# Patient Record
Sex: Male | Born: 1940 | Race: White | Hispanic: No | Smoking: Former smoker
Health system: Southern US, Community
[De-identification: ages and names within clinical notes are randomized; demographics above are authoritative.]

## PROBLEM LIST (undated history)

## (undated) DIAGNOSIS — I739 Peripheral vascular disease, unspecified: Secondary | ICD-10-CM

## (undated) DIAGNOSIS — R9439 Abnormal result of other cardiovascular function study: Secondary | ICD-10-CM

## (undated) DIAGNOSIS — I509 Heart failure, unspecified: Secondary | ICD-10-CM

## (undated) DIAGNOSIS — M1990 Unspecified osteoarthritis, unspecified site: Secondary | ICD-10-CM

## (undated) DIAGNOSIS — C801 Malignant (primary) neoplasm, unspecified: Secondary | ICD-10-CM

## (undated) DIAGNOSIS — C44329 Squamous cell carcinoma of skin of other parts of face: Secondary | ICD-10-CM

## (undated) DIAGNOSIS — C44729 Squamous cell carcinoma of skin of left lower limb, including hip: Secondary | ICD-10-CM

## (undated) DIAGNOSIS — I639 Cerebral infarction, unspecified: Secondary | ICD-10-CM

## (undated) DIAGNOSIS — I2510 Atherosclerotic heart disease of native coronary artery without angina pectoris: Secondary | ICD-10-CM

## (undated) DIAGNOSIS — Z862 Personal history of diseases of the blood and blood-forming organs and certain disorders involving the immune mechanism: Secondary | ICD-10-CM

## (undated) DIAGNOSIS — E785 Hyperlipidemia, unspecified: Secondary | ICD-10-CM

## (undated) DIAGNOSIS — Z8719 Personal history of other diseases of the digestive system: Secondary | ICD-10-CM

## (undated) DIAGNOSIS — Z8673 Personal history of transient ischemic attack (TIA), and cerebral infarction without residual deficits: Secondary | ICD-10-CM

## (undated) DIAGNOSIS — I10 Essential (primary) hypertension: Secondary | ICD-10-CM

## (undated) DIAGNOSIS — I6529 Occlusion and stenosis of unspecified carotid artery: Secondary | ICD-10-CM

## (undated) DIAGNOSIS — B029 Zoster without complications: Secondary | ICD-10-CM

## (undated) DIAGNOSIS — N189 Chronic kidney disease, unspecified: Secondary | ICD-10-CM

## (undated) HISTORY — DX: Hyperlipidemia, unspecified: E78.5

## (undated) HISTORY — DX: Cerebral infarction, unspecified: I63.9

## (undated) HISTORY — DX: Personal history of transient ischemic attack (TIA), and cerebral infarction without residual deficits: Z86.73

## (undated) HISTORY — DX: Essential (primary) hypertension: I10

## (undated) HISTORY — DX: Occlusion and stenosis of unspecified carotid artery: I65.29

## (undated) HISTORY — DX: Squamous cell carcinoma of skin of left lower limb, including hip: C44.729

## (undated) HISTORY — DX: Peripheral vascular disease, unspecified: I73.9

## (undated) HISTORY — PX: SQUAMOUS CELL CARCINOMA EXCISION: SHX2433

## (undated) HISTORY — DX: Heart failure, unspecified: I50.9

## (undated) HISTORY — DX: Abnormal result of other cardiovascular function study: R94.39

## (undated) HISTORY — DX: Personal history of diseases of the blood and blood-forming organs and certain disorders involving the immune mechanism: Z86.2

## (undated) HISTORY — DX: Squamous cell carcinoma of skin of other parts of face: C44.329

## (undated) HISTORY — DX: Atherosclerotic heart disease of native coronary artery without angina pectoris: I25.10

## (undated) HISTORY — PX: TONSILLECTOMY: SUR1361

---

## 1958-09-26 HISTORY — PX: OTHER SURGICAL HISTORY: SHX169

## 2003-09-27 HISTORY — PX: CAROTID ENDARTERECTOMY: SUR193

## 2004-04-22 ENCOUNTER — Ambulatory Visit (HOSPITAL_COMMUNITY): Admission: RE | Admit: 2004-04-22 | Discharge: 2004-04-22 | Payer: Self-pay | Admitting: Neurology

## 2004-04-28 ENCOUNTER — Inpatient Hospital Stay (HOSPITAL_COMMUNITY): Admission: RE | Admit: 2004-04-28 | Discharge: 2004-04-29 | Payer: Self-pay | Admitting: Vascular Surgery

## 2004-04-28 ENCOUNTER — Encounter (INDEPENDENT_AMBULATORY_CARE_PROVIDER_SITE_OTHER): Payer: Self-pay | Admitting: *Deleted

## 2005-07-18 ENCOUNTER — Encounter: Admission: RE | Admit: 2005-07-18 | Discharge: 2005-07-18 | Payer: Self-pay | Admitting: Neurology

## 2006-12-08 ENCOUNTER — Encounter: Admission: RE | Admit: 2006-12-08 | Discharge: 2006-12-08 | Payer: Self-pay | Admitting: Orthopedic Surgery

## 2006-12-22 ENCOUNTER — Ambulatory Visit: Payer: Self-pay | Admitting: Vascular Surgery

## 2007-06-27 ENCOUNTER — Ambulatory Visit: Payer: Self-pay | Admitting: Vascular Surgery

## 2007-12-04 ENCOUNTER — Encounter: Admission: RE | Admit: 2007-12-04 | Discharge: 2007-12-04 | Payer: Self-pay | Admitting: Orthopedic Surgery

## 2008-02-21 ENCOUNTER — Ambulatory Visit: Payer: Self-pay | Admitting: Vascular Surgery

## 2008-07-09 ENCOUNTER — Ambulatory Visit: Payer: Self-pay | Admitting: Vascular Surgery

## 2009-01-14 ENCOUNTER — Ambulatory Visit: Payer: Self-pay | Admitting: Vascular Surgery

## 2009-08-26 ENCOUNTER — Ambulatory Visit: Payer: Self-pay | Admitting: Vascular Surgery

## 2010-03-31 ENCOUNTER — Ambulatory Visit: Payer: Self-pay | Admitting: Vascular Surgery

## 2010-10-16 ENCOUNTER — Encounter: Payer: Self-pay | Admitting: Neurology

## 2010-10-25 ENCOUNTER — Ambulatory Visit
Admission: RE | Admit: 2010-10-25 | Discharge: 2010-10-25 | Payer: Self-pay | Source: Home / Self Care | Attending: Cardiovascular Disease | Admitting: Cardiovascular Disease

## 2010-11-08 NOTE — Assessment & Plan Note (Signed)
[facility]                       [place] CARDIOLOGY OFFICE NOTE  Louis Barton, Louis Barton                        MRN:          [id number] DATE:10/25/2010                            DOB:          [birth year]   HISTORY OF PRESENT ILLNESS:  Louis Barton is a 70 year old gentleman who is here today to establish cardiovascular care.  He used to follow up with Dr. Sherlyn Lick who retired recently.  The patient has the following problem list: 1. Peripheral arterial disease, status post left carotid     endarterectomy in 2005. 2. History of TIA in 2005. 3. Hyperlipidemia. 4. Hypertension. 5. Previous tobacco use. 6. Excessive alcohol use. 7. Chronic thrombocytopenia.  HISTORY OF PRESENT ILLNESS:  Louis Barton is here today for the first time. Overall, he reports no previous cardiac history.  There is no history of myocardial infarction or revascularization.  He has not had any chest pain, dyspnea, palpitations, dizziness, or syncope.  He does have severe hyperlipidemia and takes 3 medications to control that.  He does admit to excessive alcohol drinking.  He drinks 2-3 Scotch a day on average and sometimes more.  He exercises regularly at the gym but has not been doing so over the last few months.  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  Chronic health conditions include history of TIA which presented in the past amaurosis fugax.  He also has hypertension.  MEDICATIONS: 1. WelChol 625 mg twice daily. 2. Metoprolol succinate 25 mg half a tablet once daily. 3. Diovan/hydrochlorothiazide 320/25 mg once daily. 4. Zetia 10 mg once daily. 5. Crestor 40 mg once daily. 6. Aspirin 325 mg once daily. 7. Clonazepam as needed.  ALLERGIES:  No known drug allergies.  SOCIAL HISTORY:  He quit smoking in 1974.  He drinks 2-3 Scotch a day. He usually exercises 3 times a week at the gym.  He is married.  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  FAMILY  HISTORY:  Negative for premature coronary artery disease.  His father had congestive heart failure, but he was 72 year old when he died.  REVIEW OF SYSTEMS:  Overall, this is completely negative.  A full review of system was performed.  PHYSICAL EXAMINATION:  GENERAL:  The patient appears to be at his stated age and in no acute distress. VITAL SIGNS:  Weight is 201.8 pounds, blood pressure is 124/69, pulse is 59, oxygen saturation is 95% on room air. HEENT:  Normocephalic and atraumatic. NECK:  There is a surgical scar on the left side from previous left carotid endarterectomy.  There is a faint carotid bruit on the right side. RESPIRATORY:  Normal respiratory effort with no use of accessory muscles.  Auscultation reveals normal breath sounds. CARDIOVASCULAR:  Normal PMI.  Normal S1 and S2 with no gallops or murmurs. ABDOMEN:  Benign, nontender, and nondistended. EXTREMITIES:  With no clubbing, cyanosis, or edema. SKIN:  Warm and dry with no rash. PSYCHIATRIC:  He is alert, oriented x3 with normal mood and affect. MUSCULOSKELETAL:  There is normal muscle strength in the upper and lower extremities.  LABORATORY DATA:  An electrocardiogram was performed which showed sinus bradycardia with first-degree AV block.  Heart rate is 56.  There is no significant ST or T-wave changes.  IMPRESSION: 1. Hypertension:  Blood pressure is under excellent control.  We will     continue with current medications.  Higher doses of metoprolol     caused him to have significant fatigue.  We will continue with     current medications. 2. Hyperlipidemia:  His lipid profile is not available for review.  He     is taking 3 medications.  He will obtain US a copy of his most     recent lipid profile.  I advised him to cut down on alcohol intake     which might be contributing to his hyperlipidemia and elevated     triglycerides.  I also advised him to intensify his exercise     program.  His most recent  stress test was done in April of 2011     which showed no evidence of ischemia.  It was a stress     echocardiogram.  His ejection fraction was normal.  History of carotid artery stenosis.  This is being followed by Dr. Darrick Penna.  He has known moderate stenosis on the right side which is being monitored.  The patient will follow up in 6 months from now or earlier as needed.    Lorine Bears, MD Electronically Signed   MA/MedQ  DD: 10/25/2010  DT: 10/26/2010  Job #: [id number]

## 2011-01-03 ENCOUNTER — Telehealth: Payer: Self-pay | Admitting: *Deleted

## 2011-01-03 NOTE — Telephone Encounter (Signed)
Patient walked in to clinic for BP and HR check.  His HR was low yesterday-in the 40's.  BP today 115/66 with HR 54.  Feeling fine.  States he will continue to monitor his HR.  On Metoprolol - advised needs f/u with Dr. Kirke Corin.

## 2011-02-08 NOTE — Procedures (Signed)
CAROTID DUPLEX EXAM   INDICATION:  Follow up known carotid disease.   HISTORY:  Diabetes:  No  Cardiac:  No  Hypertension:  Yes  Smoking:  Quit  Previous Surgery:  Left carotid endarterectomy with DPA in 2005.  CV History:  Asymptomatic  Amaurosis Fugax No, Paresthesias No, Hemiparesis No                                       RIGHT             LEFT  Brachial systolic pressure:         128               124  Brachial Doppler waveforms:         normal            normal  Vertebral direction of flow:        antegrade         not visualized  DUPLEX VELOCITIES (cm/sec)  CCA peak systolic                   55                80  ECA peak systolic                   greater than 605  77  ICA peak systolic                   257               81  ICA end diastolic                   75                29  PLAQUE MORPHOLOGY:                  heterogeneous  PLAQUE AMOUNT:                      moderate to severe                  none  PLAQUE LOCATION:                    ICA/ECA           none   IMPRESSION:  1. Doppler velocities suggest 60%-79% stenosis in the right internal      carotid artery.  2. Right external carotid artery stenosis.  3. Patent left internal carotid artery post carotid endarterectomy      with no evidence of narrowing or restenosis.  4. Antegrade flow in the right vertebral artery.  Left vertebral      artery not visualized.  5. Stable from previous exams.   ___________________________________________  Janetta Hora Fields, MD   NT/MEDQ  D:  03/31/2010  T:  03/31/2010  Job:  [id number]

## 2011-02-08 NOTE — Procedures (Signed)
CAROTID DUPLEX EXAM   INDICATION:  Follow up known carotid artery disease.   HISTORY:  Diabetes:  No.  Cardiac:  No.  Hypertension:  Yes.  Smoking:  Quit 35 years ago.  Previous Surgery:  Left carotid endarterectomy in 2005.  CV History:  Amaurosis Fugax No, Paresthesias No, Hemiparesis No.                                       RIGHT             LEFT  Brachial systolic pressure:         150               140  Brachial Doppler waveforms:         Biphasic          Monophasic  Vertebral direction of flow:        Antegrade         Not well  visualized  DUPLEX VELOCITIES (cm/sec)  CCA peak systolic                   64                70  ECA peak systolic                   39                84  ICA peak systolic                   231               79  ICA end diastolic                   71                20  PLAQUE MORPHOLOGY:                  Calcified         None  PLAQUE AMOUNT:                      Moderate          None  PLAQUE LOCATION:                    ICA, ECA          None   IMPRESSION:  1. 60-79% stenosis noted in the right internal carotid artery.  2. Normal carotid duplex noted in the left internal carotid artery.  3. Antegrade right vertebral arteries.   ___________________________________________  Janetta Hora Fields, MD   MG/MEDQ  D:  07/09/2008  T:  07/09/2008  Job:  [id number]

## 2011-02-08 NOTE — Procedures (Signed)
CAROTID DUPLEX EXAM   INDICATION:  Follow up carotid artery disease.   HISTORY:  Diabetes:  No.  Cardiac:  No.  Hypertension:  Yes.  Smoking:  Quit in 1974.  Previous Surgery:  Left carotid endarterectomy with DPA in 2005 by Dr.  Darrick Penna.  CV History:  Amaurosis Fugax No, Paresthesias No, Hemiparesis No                                       RIGHT             LEFT  Brachial systolic pressure:         120               100  Brachial Doppler waveforms:         Biphasic          Monophasic  Vertebral direction of flow:        Antegrade         Not visualized  DUPLEX VELOCITIES (cm/sec)  CCA peak systolic                   67                92  ECA peak systolic                   Greater than 600  87  ICA peak systolic                   131               54  ICA end diastolic                   40                12  PLAQUE MORPHOLOGY:                  Calcified         None  PLAQUE AMOUNT:                      Large             None  PLAQUE LOCATION:                    ICA, ECA          None   IMPRESSION:  1. Right external carotid artery stenosis.  2. A 40-59% right internal carotid artery stenosis distally (low end      of range).  3. No left internal carotid artery stenosis, status post      endarterectomy.  4. 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  5. Study essentially unchanged from December 22, 2006.   ___________________________________________  Janetta Hora. Darrick Penna, MD   DP/MEDQ  D:  06/27/2007  T:  06/28/2007  Job:  [id number]

## 2011-02-08 NOTE — Procedures (Signed)
CAROTID DUPLEX EXAM   INDICATION:  Followup known carotid disease.   HISTORY:  Diabetes:  No.  Cardiac:  No.  Hypertension:  Yes.  Smoking:  Quit.  Previous Surgery:  Left carotid endarterectomy with DPA in 2005.  CV History:  No.  Amaurosis Fugax No, Paresthesias No, Hemiparesis No                                       RIGHT             LEFT  Brachial systolic pressure:         134               112  Brachial Doppler waveforms:         Normal            Monophasic  Vertebral direction of flow:        Antegrade         Not visualized  DUPLEX VELOCITIES (cm/sec)  CCA peak systolic                   88                129  ECA peak systolic                   561               107  ICA peak systolic                   333               84  ICA end diastolic                   79                30  PLAQUE MORPHOLOGY:                  Heterogeneous  PLAQUE AMOUNT:                      Moderate to severe  PLAQUE LOCATION:                    ICA, ECA   IMPRESSION:  1. Right internal carotid artery suggests 60%-79% stenosis.  2. Left internal carotid artery appears normal with no restenosis.  3. Right external carotid artery suggests stenosis.  4. Antegrade flow in right vertebral.  5. Left vertebral not visualized which may indicate an occlusion.   ___________________________________________  Janetta Hora Fields, MD   CB/MEDQ  D:  08/26/2009  T:  08/26/2009  Job:  [id number]

## 2011-02-08 NOTE — Procedures (Signed)
CAROTID DUPLEX EXAM   INDICATION:  Followup carotid artery disease.   HISTORY:  Diabetes:  No.  Cardiac:  No.  Hypertension:  Yes.  Smoking:  Quit.  Previous Surgery:  Left CEA with DPA 2005 by Dr. Darrick Penna.  CV History:  No.  Amaurosis Fugax No, Paresthesias No, Hemiparesis No                                       RIGHT             LEFT  Brachial systolic pressure:         142               128  Brachial Doppler waveforms:         WNL               Monophasic  Vertebral direction of flow:        Antegrade         Not visualized  DUPLEX VELOCITIES (cm/sec)  CCA peak systolic                   54                83  ECA peak systolic                   >574              84  ICA peak systolic                   227               91  ICA end diastolic                   73                29  PLAQUE MORPHOLOGY:                  Calcified         N/A  PLAQUE AMOUNT:                      Moderate          N/A  PLAQUE LOCATION:                    ICA/ECA           N/A   IMPRESSION:  1. Right ICA shows evidence of 60-79% stenosis.  2. Left ICA shows no evidence of restenosis status post CEA.  3. Right vertebral artery appears antegrade, left vertebral artery      flow not visualized suggestive of occlusion.  4. Right ECA high-grade stenosis.  5. Known left subclavian artery stenosis.  6. No significant changes from previous study.   ___________________________________________  Janetta Hora Fields, MD   AS/MEDQ  D:  01/14/2009  T:  01/14/2009  Job:  [id number]

## 2011-02-08 NOTE — Assessment & Plan Note (Signed)
OFFICE VISIT   ABDALLAH, HERN  DOB:  [birth year]                                       03/31/2010  CHART#:[id number]   The patient returns for follow-up today.  He underwent left carotid  endarterectomy in 2005.  He has done well since.  He is returning for  regularly scheduled follow-up.  Chronic medical problems include  hypertension and elevated cholesterol.  He is on medication for these  and these are currently controlled and followed with Dr. Shary Decamp.  He is  also on antiplatelet therapy in the form of aspirin.  Apparently Dr.  Sherlyn Lick recently started him on metoprolol as well.  He reports no  symptoms of TIA, amaurosis or stroke.  He has been having some numbness  in his right arm at night time which is involving the fourth and fifth  digit on the right hand.  This is intermittent in nature and does not  occur consistently every night.  It has never occurred during the  daytime.  He has had no motor loss.   CURRENT MEDICATIONS:  Diovan, Crestor, aspirin, Welchol __________ , and  metoprolol.   He denies any drug allergies.   REVIEW OF SYSTEMS:  He denies any shortness breath or chest pain.   PHYSICAL EXAMINATION:  Blood pressure is 120/71 in the right arm, 120/75  in the left arm, heart rate is 45 and regular.  Temperature is 98.  HEENT:  Unremarkable.  Neck:  Has 2+ carotid pulses without bruit.  He  has 2+ brachial pulses bilaterally.  He has a  2+ right radial pulse.  He has absent left radial pulse.  Upper extremity and lower extremity  motor strength is 5/5 and symmetric.   He had a carotid duplex exam today which shows a 60% to 80% right  internal carotid artery stenosis with no significant recurrent stenosis  on the left side.  He has antegrade vertebral flow on the right side.  Left vertebral artery was not visualized.  This is all stable from his  previous exam.   In summary, the patient has an asymptomatic moderate right carotid  stenosis.  The left internal carotid artery is widely patent.  He will  continue his antiplatelet therapy and risk factor modification was  controlled with his hypertension and  elevated cholesterol.  He will  return for follow-up if he has any neurologic symptoms develop.  Otherwise we will see him in one year's time for repeat carotid duplex  exam.     Janetta Hora. Fields, MD  Electronically Signed   CEF/MEDQ  D:  03/31/2010  T:  04/01/2010  Job:  [id number]   cc:   Harl Bowie, M.D.  Feliciana Rossetti, MD

## 2011-02-11 NOTE — Op Note (Signed)
NAME:  Louis Barton, Louis Barton                           ACCOUNT NO.:  [id number]   MEDICAL RECORD NO.:  [id number]                   PATIENT TYPE:  INP   LOCATION:  3308                                 FACILITY:  [facility]   PHYSICIAN:  Janetta Hora. Fields, MD               DATE OF BIRTH:  01-15-1941   DATE OF PROCEDURE:  DATE OF DISCHARGE:  04/29/2004                                 OPERATIVE REPORT   PROCEDURE:  Left carotid endarterectomy with Dacron patch angioplasty.   PREOPERATIVE DIAGNOSIS:  Severe left carotid stenosis.   POSTOPERATIVE DIAGNOSIS:  Severe left carotid stenosis.   ANESTHESIA:  General.   ASSISTANT:  Josephina Gip, M.D., Pecola Leisure, P.A.   OPERATIVE FINDINGS:  1. Severe left internal carotid artery stenosis.  2. Dacron patch.  3.     Straight shunt.   OPERATIVE DETAILS:  After obtaining informed consent, the patient was taken  to the operating room. The patient was placed in the supine position on the  operating table. After induction of general anesthesia and endotracheal  intubation, the patient's left neck was prepped and draped in the usual  sterile fashion. Oblique incision was made just anterior to the border of  the left sternocleidomastoid muscle and carried down through the  subcutaneous tissues and platysma. The sternocleidomastoid muscle was then  retracted laterally. The anterior border of the jugular vein was identified  and retracted laterally. The common facial vein was ligated and divided  between silk ties. Common carotid artery was then identified, dissected free  circumferentially. The vagus nerve and ansa cervicalis were protected from  harm's way. The ansa was followed up to the level of the hypoglossal nerve  and this was also protected from harm's way. Dissection proceeded up to the  superior thyroid and external carotid arteries which were dissected free and  controlled. The internal carotid artery dissection was then begun after  administration of 5000 units of heparin. There was mild tortuosity of the  internal carotid artery but not severe. The internal carotid artery was  dissected free to a level where it was palpable and soft beyond the disease.  Next, the common carotid artery, internal carotid artery, external carotid  arteries were all clamped, the internal carotid artery being clamped first.  Longitudinal arteriotomy was made in the common carotid artery and extended  up through the level of the plaque. A straight shunt was then brought up  into the operative field, threaded into the internal carotid artery, and  allowed the back bleed. This was then threaded down into the common carotid  artery and then controlled with a Rumel tourniquet. Flow was then restored  to the brain after approximately 4 minutes of ischemia. Next, an  endarterectomy was begun in a suitable plane and the plaque was removed from  the common carotid, external carotid by eversion technique and internal  carotid artery was a suitable distal  end point. There was a large amount of  loose debris within the artery and this was thoroughly debrided and removed.  Next, the artery was repaired with a Dacron patch using a running 6-0  Prolene suture. Just prior to completion of anastomosis, the internal  carotid, external carotid, and common carotid arteries were all flushed  thoroughly. After the anastomosis was completed, flow was first restored to  the external carotid artery after unclamping the common carotid artery and  after five cardiac cycles to the internal carotid artery. The shunt was  removed just prior to completion of the anastomosis of the patch. Next,  hemostasis was obtained. The platysma was then reapproximated using a  running Vicryl suture. The  skin was then closed with a 4-0 subcuticular stitch. The patient tolerated  the procedure well and there were no complications. Instrument, sponge, and  needle count was correct at  the end of the case. The patient was taken to  the recovery room in stable condition, neurologically intact, with symmetric  upper and lower extremity motor strength.                                               Janetta Hora. Fields, MD    CEF/MEDQ  D:  05/07/2004  T:  05/09/2004  Job:  [id number]

## 2011-02-11 NOTE — H&P (Signed)
NAME:  Louis Barton, Louis Barton                           ACCOUNT NO.:  [id number]   MEDICAL RECORD NO.:  [id number]                   PATIENT TYPE:  INP   LOCATION:  NA                                   FACILITY:  [facility]   PHYSICIAN:  Charles E. Fields, MD               DATE OF BIRTH:  1941/04/05   DATE OF ADMISSION:  04/28/2004  DATE OF DISCHARGE:                                HISTORY & PHYSICAL   PRIMARY CARE PHYSICIAN:  Malkiat Dhatt, M.D.   NEUROLOGIST:  Genene Churn. Love, M.D.   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   HISTORY OF PRESENT ILLNESS:  The patient is a 70 year old Caucasian male who  was referred to Dr. Janetta Hora. Barton by Dr. Sandria Manly, for evaluation of  significant left internal carotid artery stenosis.  Apparently on March 28, 2004, the patient had an amaurosis-like event involving his left eye.  He  describes it as blurriness that lasted for five minutes, and then resolved.  That same day he also developed right upper extremity weakness with a slight  facial droop, which lasted approximately seven minutes.  He did take three  aspirin for this, and his symptoms did resolve.  He has had no residual  symptoms since.  He reports that he did not seek medical attention for this  until going to his scheduled appointment with his primary care physician on  April 20, 2004.  It was felt that he had suffered from a transient ischemic  attack.  He was then referred to Dr. Sandria Manly for further neurological  evaluation.  A carotid duplex was ordered, and done on April 22, 2004.  The  findings showed significant left internal carotid artery stenosis of greater  than 80%.  There was mild external carotid artery stenosis.  The internal  carotid artery was mildly tortuous.  There was no significant plaque noted  in the distal lumen.  The vertebral artery flow was to and fro.  He had a  monophasic brachial artery signal, and an elevated blood pressure gradient  suggestive of  subclavian steal syndrome; however, he was found to be  asymptomatic of this.  On the right he had a 40%-60% internal carotid artery  stenosis with moderate to severe external carotid artery stenosis.  The  vertebral artery flow was antegrade.  Based on the patient's findings, as  well as his previous symptoms, he was referred to Dr. Darrick Penna for possible  surgical consideration.  He was seen at the [facility] on April 23, 2004.  After an examination of the patient and a review of his diagnostic studies,  Dr. Darrick Penna did agree that the patient would benefit from a left carotid  endarterectomy, to prevent his risks for stroke.  Other than the symptoms mentioned above, the patient has been relatively  asymptomatic of his carotid artery disease.  He denies headache, nausea,  vomiting, vertigo, history of falls or seizures.  He denies muscle weakness,  speech impairment, dysphagia, syncope, memory loss, or confusion.  He denies  any significant numbness or tingling, other than that which is associated  with prolonged positioning or pressure on an extremity.  He does get  occasional positioned dizziness which he reports is more notable since being  on Diovan.  This is very mild, however.  He has had no further visual  changes since his initial episode.   PAST MEDICAL HISTORY:  1. Hypertension.  2. Hyperlipidemia.  3. History of nose fracture.   PAST SURGICAL HISTORY:  1. Tonsillectomy in 1942.  2. Submucous resection of his nose in 1961.   CURRENT MEDICATIONS:  1. Diovan/HCT 160/12.5 mg p.o. daily.  2. Vytorin 10/80 mg p.o. daily.  3. Plavix 75 mg p.o. daily, first started on April 22, 2004, and stopped on     April 25, 2004.  4. Aspirin 325 mg daily.   ALLERGIES:  No known drug allergies.   REVIEW OF SYSTEMS:  Please refer to the history of present illness for  pertinent positives and negatives.  In addition, he denies a history of  diabetes, colitis, or asthma.  He denies chest pain,  shortness of breath, or  hematochezia.  He denies diarrhea or constipation.  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   FAMILY HISTORY:  Both of his parents died at age 53.  His mother died of  breast cancer, and also had a history of a stroke.  His father died from  heart failure, secondary to a leaky valve.  He also had a history of  coronary artery disease with a history of a myocardial infarction.   SOCIAL HISTORY:  He is married with two children.  He is self-employed as a  Designer, industrial/product.  He smoked cigarettes from age 34 until 1;  however, he continued to smoke cigars until 1986.  He does have three to  four drinks of liquor per week.  He also consumes about 24 ounces of wine  per week.  He does exercise three to four times per week, and also plays  golf.   PHYSICAL EXAMINATION:  VITAL SIGNS:  Blood pressure in his left arm 100/80,  blood pressure in his right arm 120/70, pulse 60, respirations 20.  GENERAL:  The patient is a 70 year old Caucasian male, who is alert and  cooperative, in no acute distress.  HEENT:  Head normocephalic, atraumatic.  He is wearing glasses.  His pupils  are equal, round, reactive to light and accommodation.  His extraocular  movements are intact.  His sclerae are anicteric.  His oral mucous membranes  are pink and moist.  There is no erythema or lesions are noted.  NECK:  Supple.  No jugular venous distention, lymphadenopathy, or  thyromegaly noted.  He does have palpable carotid pulses.  He does have a  soft left bruit.  No bruits auscultated on the right.  CHEST:  Symmetrical on inspiration.  His lung sounds are clear throughout.  HEART:  His heart has a regular rate and rhythm.  No murmur, rub, or gallop  was noted.  ABDOMEN:  Soft, nontender, nondistended.  His bowel sounds are normoactive  x4.  No masses are palpated.  No gross hepatosplenomegaly was noted.  GENITOURINARY:  Examination was deferred.  RECTAL:  Examination was  deferred. EXTREMITIES:  His extremities are warm and dry.  There is no edema or  ulcerations that were noted.  He does have 1+ dorsalis pedis pulses  bilaterally and 2+ posterior tibial pulses bilaterally.  NEUROLOGIC:  His neurologic exam is grossly intact.  His speech is clear.  His gait is steady.  His muscle strength in his bilateral upper and lower  extremities is 5/5.  He has 2+ patellar reflexes.   ASSESSMENT/PLAN:  The patient is a 70 year old Caucasian male with a history  of recent transient ischemic attack.  He was found to have a significant  left internal carotid artery stenosis.  He will be electively admitted to [facility], to  undergo a left carotid endarterectomy by Dr. Darrick Penna on April 28, 2004.  Dr.  Darrick Penna has seen and evaluated this patient prior to this admission, and has  explained the risks and benefits of the procedure, and the patient has  agreed to proceed.  Dr. Darrick Penna is aware that the patient  misunderstood when he was supposed to stop his Plavix, and did not stop it  until April 25, 2004.  Dr. Darrick Penna does feel that it is safe to proceed with  surgery as planned.  The patient was instructed to stop his Plavix until  further instruction.      Jerold Coombe, P.A.                  Louis Hora. Fields, MD    AWZ/MEDQ  D:  04/26/2004  T:  04/26/2004  Job:  [id number]   cc:   Genene Churn. Love, M.D.  [address]  [address]  [place]  [place] [address]  Fax: [phone]   Harl Bowie, M.D.  [address]  [place]  [place] [address]  Fax: [phone]

## 2011-02-15 ENCOUNTER — Other Ambulatory Visit: Payer: Self-pay | Admitting: Cardiovascular Disease

## 2011-03-31 ENCOUNTER — Other Ambulatory Visit: Payer: Medicare Other

## 2011-04-04 ENCOUNTER — Other Ambulatory Visit (INDEPENDENT_AMBULATORY_CARE_PROVIDER_SITE_OTHER): Payer: Medicare Other

## 2011-04-04 DIAGNOSIS — I6529 Occlusion and stenosis of unspecified carotid artery: Secondary | ICD-10-CM

## 2011-04-05 ENCOUNTER — Encounter: Payer: Self-pay | Admitting: Vascular Surgery

## 2011-04-07 ENCOUNTER — Ambulatory Visit (INDEPENDENT_AMBULATORY_CARE_PROVIDER_SITE_OTHER): Payer: Medicare Other | Admitting: Vascular Surgery

## 2011-04-07 ENCOUNTER — Encounter: Payer: Self-pay | Admitting: Vascular Surgery

## 2011-04-07 VITALS — BP 143/73 | HR 54

## 2011-04-07 DIAGNOSIS — I739 Peripheral vascular disease, unspecified: Secondary | ICD-10-CM

## 2011-04-07 NOTE — Progress Notes (Signed)
Patient is a 70 year old male who presents today for evaluation of a high-grade right internal carotid artery stenosis. He previously had a left carotid endarterectomy in 2005. He has had slow progression of stenosis on the right side. He denies any symptoms of TIA amaurosis or stroke. He did have some presyncopal-type symptoms recently. He had a cardiac stress test approximately one year ago and says it was okay. He has followup scheduled at [facility] cardiology [place] next week.  Chronic medical problems include hypertension and elevated cholesterol. These are currently controlled and followed by Dr. Feliciana Rossetti.  Review of systems: Gen.: Some recent weight loss  Vascular: Prior mini stroke  All other systems on 10 points are negative  Physical exam:  HEENT: Negative  Chest: There to auscultation  Cardiac: Regular rate and rhythm without murmur  Abdomen: Soft nontender nondistended no mass  Musculoskeletal: No obvious joint deformities  Neurologic: Upper extremity and lower extremity motor strength is 5 over 5 and symmetric  Skin: No ulcers or rashes  Vascular: 2+ brachial radial and femoral pulses bilaterally  I reviewed his carotid duplex exam Dated 04/04/2011. This shows a greater than 80% right internal carotid artery stenosis. He has no significant left-sided stenosis.  Assessment: High-grade asymptomatic right internal carotid artery stenosis  Plan: Right carotid endarterectomy on 04/25/2011 pending cardiac risk stratification in  next week. Patient will continue to take his aspirin on a daily basis.

## 2011-04-07 NOTE — Progress Notes (Signed)
F/u from vascular lab done 04-04-11.

## 2011-04-08 NOTE — Procedures (Unsigned)
CAROTID DUPLEX EXAM  INDICATION:  Coronary artery disease.  HISTORY: Diabetes:  No. Cardiac:  No. Hypertension:  Yes. Smoking:  No. Previous Surgery:  Left carotid endarterectomy with Dacron patch angioplasty in 2005. CV History:  Currently asymptomatic. Amaurosis Fugax No, Paresthesias No, Hemiparesis No Other:  Hyperlipidemia.                                      RIGHT             LEFT Brachial systolic pressure:         130               127 Brachial Doppler waveforms:         Normal            Normal Vertebral direction of flow:        Antegrade         Not visualized DUPLEX VELOCITIES (cm/sec) CCA peak systolic                   55                85 ECA peak systolic                   40                84 ICA peak systolic                   386               63 ICA end diastolic                   148               22 PLAQUE MORPHOLOGY:                  Calcific PLAQUE AMOUNT:                      Severe            None PLAQUE LOCATION:                    ICA, ECA  IMPRESSION: 1. Right internal carotid artery velocity suggests 80%-99% stenosis. 2. Patent left carotid endarterectomy site with no evidence of     restenosis of the internal carotid artery. 3. Dr. Myra Gianotti was consulted on the patient's new finding and     recommended the patient to schedule an appointment with Dr. Darrick Penna     on 04/07/2011.  ___________________________________________ Janetta Hora. Fields, MD  EM/MEDQ  D:  04/04/2011  T:  04/04/2011  Job:  [id number]

## 2011-04-14 ENCOUNTER — Encounter: Payer: Self-pay | Admitting: Cardiovascular Disease

## 2011-04-14 ENCOUNTER — Ambulatory Visit (INDEPENDENT_AMBULATORY_CARE_PROVIDER_SITE_OTHER): Payer: Medicare Other | Admitting: Cardiovascular Disease

## 2011-04-14 ENCOUNTER — Other Ambulatory Visit: Payer: Self-pay | Admitting: Cardiovascular Disease

## 2011-04-14 VITALS — BP 112/75 | HR 54 | Ht 69.0 in | Wt 190.0 lb

## 2011-04-14 DIAGNOSIS — I10 Essential (primary) hypertension: Secondary | ICD-10-CM

## 2011-04-14 DIAGNOSIS — Z01810 Encounter for preprocedural cardiovascular examination: Secondary | ICD-10-CM

## 2011-04-14 DIAGNOSIS — I6529 Occlusion and stenosis of unspecified carotid artery: Secondary | ICD-10-CM | POA: Insufficient documentation

## 2011-04-14 DIAGNOSIS — E785 Hyperlipidemia, unspecified: Secondary | ICD-10-CM

## 2011-04-14 HISTORY — DX: Encounter for preprocedural cardiovascular examination: Z01.810

## 2011-04-14 MED ORDER — ROSUVASTATIN CALCIUM 40 MG PO TABS: 40.0000 mg | ORAL_TABLET | Freq: Every day | ORAL | Status: DC

## 2011-04-14 MED ORDER — VALSARTAN-HYDROCHLOROTHIAZIDE 320-25 MG PO TABS: 1.0000 | ORAL_TABLET | Freq: Every day | ORAL | Status: DC

## 2011-04-14 NOTE — Progress Notes (Signed)
HPI  This is a 70 year old male who is here today for a followup visit as well as preoperative cardiovascular evaluation for right carotid endarterectomy. The patient is being followed for chronic medical conditions that include hypertension and hyperlipidemia. His hyperlipidemia has been difficult to control. 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 He is a previous smoker. He does not have history of coronary artery disease with previous myocardial infarction. He had no previous coronary revascularization. He had a stress test done in April of last year which was within normal limits. Overall, he denies any chest pain or dyspnea. There is no palpitations. He is able to perform all activities of daily living without significant limitations.  No Known Allergies   Current Outpatient Prescriptions on File Prior to Visit  Medication Sig Dispense Refill  . aspirin 325 MG tablet Take 325 mg by mouth daily.        [name] CLONAZEPAM PO Take by mouth as needed.        . colesevelam (WELCHOL) 625 MG tablet Take 1,875 mg by mouth 2 (two) times daily with a meal.        . rosuvastatin (CRESTOR) 40 MG tablet Take 40 mg by mouth daily.        . valsartan-hydrochlorothiazide (DIOVAN-HCT) 320-25 MG per tablet Take 1 tablet by mouth daily.           Past Medical History  Diagnosis Date  . PAD (peripheral artery disease)   . History of TIAs   . History of thrombocytopenia   . Carotid stenosis   . Hyperlipidemia   . Hypertension      Past Surgical History  Procedure Date  . Carotid endarterectomy 2005    Left carotid by Dr. Darrick Penna  . Septoplasty, nasal w/ submucosal resection 1960     Family History  Problem Relation Age of Onset  . Cancer Mother   . Stroke Mother      History   Social History  . Marital Status: Married    Spouse Name: N/A    Number of Children: N/A  . Years of Education: N/A   Occupational History  . Not on file.   Social History Main  Topics  . Smoking status: Former Smoker    Types: Cigarettes    Quit date: 09/26/1972  . Smokeless tobacco: Not on file  . Alcohol Use: 0.0 oz/week      2-3 Drinks of Scotch per Day  . Drug Use: No  . Sexually Active:    Other Topics Concern  . Not on file   Social History Narrative  . No narrative on file     ROS Constitutional: Negative for fever, chills, diaphoresis, activity change, appetite change and fatigue.  HENT: Negative for hearing loss, nosebleeds, congestion, sore throat, facial swelling, drooling, trouble swallowing, neck pain, voice change, sinus pressure and tinnitus.  Eyes: Negative for photophobia, pain, discharge and visual disturbance.  Respiratory: Negative for apnea, cough, chest tightness, shortness of breath and wheezing.  Cardiovascular: Negative for chest pain, palpitations and leg swelling.  Gastrointestinal: Negative for nausea, vomiting, abdominal pain, diarrhea, constipation, blood in stool and abdominal distention.  Genitourinary: Negative for dysuria, urgency, frequency, hematuria and decreased urine volume.  Musculoskeletal: Negative for myalgias, back pain, joint swelling, arthralgias and gait problem.  Skin: Negative for color change, pallor, rash and wound.  Neurological: Negative for dizziness, tremors, seizures, syncope, speech difficulty, weakness, light-headedness, numbness and headaches.  Psychiatric/Behavioral: Negative for  suicidal ideas, hallucinations, behavioral problems and agitation. The patient is not nervous/anxious.     PHYSICAL EXAM   BP 112/75  Pulse 54  Ht 5\' 9"  (1.753 m)  Wt 190 lb (86.183 kg)  BMI 28.06 kg/m2  SpO2 92%  Constitutional: He is oriented to person, place, and time. He appears well-developed and well-nourished. No distress.  HENT: No nasal discharge.  Head: Normocephalic and atraumatic.  Eyes: Pupils are equal, round, and reactive to light. Right eye exhibits no discharge. Left eye exhibits no discharge.   Neck: Normal range of motion. Neck supple. No JVD present. No thyromegaly present.  Cardiovascular: Normal rate, regular rhythm, normal heart sounds and intact distal pulses. Exam reveals no gallop and no friction rub.  No murmur heard.  Pulmonary/Chest: Effort normal and breath sounds normal. No stridor. No respiratory distress. He has no wheezes. He has no rales. He exhibits no tenderness.  Abdominal: Soft. Bowel sounds are normal. He exhibits no distension. There is no tenderness. There is no rebound and no guarding.  Musculoskeletal: Normal range of motion. He exhibits no edema and no tenderness.  Neurological: He is alert and oriented to person, place, and time. Coordination normal.  Skin: Skin is warm and dry. No rash noted. He is not diaphoretic. No erythema. No pallor.  Psychiatric: He has a normal mood and affect. His behavior is normal. Judgment and thought content normal.      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 No significant ST or T wave changes. No evidence of prior infarcts.   ASSESSMENT AND PLAN

## 2011-04-14 NOTE — Assessment & Plan Note (Signed)
I reviewed his recent labs. Total cholesterol was 194, triglycerides 65, HDL 66 and an LDL of 115. This is in spite of being on Crestor 40 mg daily as well as WelChol. His target LDL should be less than 100. I suspect that excessive alcohol use his causing some of his hyperlipidemia. I recommend resuming Zetia 10 mg daily. In the past, there was no clinical evidence of effectiveness for this drug. However, recent study showed improvement in clinical outcome with this medication in patients with renal failure.

## 2011-04-14 NOTE — Patient Instructions (Signed)
Your physician recommends that you schedule a follow-up appointment in: 6 months  

## 2011-04-14 NOTE — Assessment & Plan Note (Signed)
Preoperative cardiovascular evaluation for carotid endarterectomy. The patient overall does not have history of ischemic heart disease. He is currently not having any symptoms suggestive of angina. His baseline ECG is normal and previous stress test last year in April was also normal. Based on that, the patient is at low risk for the planned surgery. No further cardiac evaluation is needed at this time. He was on a beta blocker but that was discontinued due to bradycardia. He does have mild sinus bradycardia with first degree AV block but that should not really have much implications on his surgery.

## 2011-04-14 NOTE — Assessment & Plan Note (Signed)
His blood pressure is well controlled. Continue current medications. Will keep him off metoprolol due to mild sinus bradycardia and first degree AV block.

## 2011-04-18 ENCOUNTER — Encounter: Payer: Self-pay | Admitting: Cardiovascular Disease

## 2011-04-21 ENCOUNTER — Encounter (HOSPITAL_COMMUNITY)
Admission: RE | Admit: 2011-04-21 | Discharge: 2011-04-21 | Disposition: A | Discharge status: Home / Self Care | Payer: Medicare Other | Source: Ambulatory Visit | Attending: Vascular Surgery | Admitting: Vascular Surgery

## 2011-04-21 ENCOUNTER — Other Ambulatory Visit: Payer: Self-pay | Admitting: Vascular Surgery

## 2011-04-21 ENCOUNTER — Other Ambulatory Visit: Payer: Self-pay | Admitting: *Deleted

## 2011-04-21 ENCOUNTER — Ambulatory Visit (HOSPITAL_COMMUNITY)
Admission: RE | Admit: 2011-04-21 | Discharge: 2011-04-21 | Disposition: A | Discharge status: Home / Self Care | Payer: Medicare Other | Source: Ambulatory Visit | Attending: Vascular Surgery | Admitting: Vascular Surgery

## 2011-04-21 DIAGNOSIS — I6529 Occlusion and stenosis of unspecified carotid artery: Secondary | ICD-10-CM

## 2011-04-21 DIAGNOSIS — Z01811 Encounter for preprocedural respiratory examination: Secondary | ICD-10-CM | POA: Insufficient documentation

## 2011-04-21 DIAGNOSIS — Z01812 Encounter for preprocedural laboratory examination: Secondary | ICD-10-CM | POA: Insufficient documentation

## 2011-04-21 LAB — COMPREHENSIVE METABOLIC PANEL
ALT: 17 U/L (ref 0–53)
AST: 23 U/L (ref 0–37)
Alkaline Phosphatase: 60 U/L (ref 39–117)
BUN: 21 mg/dL (ref 6–23)
CO2: 30 mEq/L (ref 19–32)
Calcium: 9.5 mg/dL (ref 8.4–10.5)
Chloride: 105 mEq/L (ref 96–112)
Creatinine, Ser: 0.82 mg/dL (ref 0.50–1.35)
GFR calc Af Amer: >60 mL/min (ref >60)
GFR calc non Af Amer: >60 mL/min (ref >60)
Glucose, Bld: 92 mg/dL (ref 70–99)
Potassium: 4.7 mEq/L (ref 3.5–5.1)
Sodium: 143 mEq/L (ref 135–145)
Total Bilirubin: 0.3 mg/dL (ref 0.3–1.2)

## 2011-04-21 LAB — CBC
HCT: 42.8 % (ref 39.0–52.0)
Hemoglobin: 15.1 g/dL (ref 13.0–17.0)
MCH: 33 pg (ref 26.0–34.0)
MCHC: 35.3 g/dL (ref 30.0–36.0)
MCV: 93.7 fL (ref 78.0–100.0)
Platelets: 157 10*3/uL (ref 150–400)
RBC: 4.57 MIL/uL (ref 4.22–5.81)
RDW: 14.1 % (ref 11.5–15.5)
WBC: 7.1 10*3/uL (ref 4.0–10.5)

## 2011-04-21 LAB — URINALYSIS, ROUTINE W REFLEX MICROSCOPIC
Bilirubin Urine: NEGATIVE
Glucose, UA: NEGATIVE mg/dL
Hgb urine dipstick: NEGATIVE
Leukocytes, UA: NEGATIVE
Protein, ur: NEGATIVE mg/dL
Specific Gravity, Urine: 1.024 (ref 1.005–1.030)
Urobilinogen, UA: 1 mg/dL (ref 0.0–1.0)

## 2011-04-21 LAB — PROTIME-INR
INR: 0.89 (ref 0.00–1.49)
Prothrombin Time: 12.2 seconds (ref 11.6–15.2)

## 2011-04-21 LAB — TYPE AND SCREEN: Antibody Screen: NEGATIVE

## 2011-04-21 LAB — ABO/RH: ABO/RH(D): O POS

## 2011-04-21 MED ORDER — EZETIMIBE 10 MG PO TABS: 10.0000 mg | ORAL_TABLET | Freq: Every day | ORAL | Status: DC

## 2011-04-25 ENCOUNTER — Inpatient Hospital Stay (HOSPITAL_COMMUNITY)
Admission: RE | Admit: 2011-04-25 | Discharge: 2011-04-26 | DRG: 039 | Disposition: A | Discharge status: Home / Self Care | Payer: Medicare Other | Source: Ambulatory Visit | Attending: Vascular Surgery | Admitting: Vascular Surgery

## 2011-04-25 ENCOUNTER — Other Ambulatory Visit: Payer: Self-pay | Admitting: Vascular Surgery

## 2011-04-25 DIAGNOSIS — I739 Peripheral vascular disease, unspecified: Secondary | ICD-10-CM | POA: Diagnosis present

## 2011-04-25 DIAGNOSIS — Z79899 Other long term (current) drug therapy: Secondary | ICD-10-CM

## 2011-04-25 DIAGNOSIS — Z8673 Personal history of transient ischemic attack (TIA), and cerebral infarction without residual deficits: Secondary | ICD-10-CM

## 2011-04-25 DIAGNOSIS — Z87891 Personal history of nicotine dependence: Secondary | ICD-10-CM

## 2011-04-25 DIAGNOSIS — Z7982 Long term (current) use of aspirin: Secondary | ICD-10-CM

## 2011-04-25 DIAGNOSIS — I6529 Occlusion and stenosis of unspecified carotid artery: Secondary | ICD-10-CM

## 2011-04-25 DIAGNOSIS — E7800 Pure hypercholesterolemia, unspecified: Secondary | ICD-10-CM | POA: Diagnosis present

## 2011-04-25 DIAGNOSIS — D696 Thrombocytopenia, unspecified: Secondary | ICD-10-CM | POA: Diagnosis present

## 2011-04-25 DIAGNOSIS — I10 Essential (primary) hypertension: Secondary | ICD-10-CM | POA: Diagnosis present

## 2011-04-25 HISTORY — PX: CAROTID ENDARTERECTOMY: SUR193

## 2011-04-26 LAB — CBC
MCHC: 34.6 g/dL (ref 30.0–36.0)
Platelets: 125 10*3/uL — ABNORMAL LOW (ref 150–400)
RDW: 13.9 % (ref 11.5–15.5)

## 2011-04-26 LAB — BASIC METABOLIC PANEL
BUN: 12 mg/dL (ref 6–23)
GFR calc Af Amer: >60 mL/min (ref >60)
GFR calc non Af Amer: >60 mL/min (ref >60)
Potassium: 3.5 mEq/L (ref 3.5–5.1)
Sodium: 135 mEq/L (ref 135–145)

## 2011-04-29 ENCOUNTER — Ambulatory Visit (INDEPENDENT_AMBULATORY_CARE_PROVIDER_SITE_OTHER): Payer: Medicare Other | Admitting: Vascular Surgery

## 2011-04-29 ENCOUNTER — Encounter: Payer: Self-pay | Admitting: Vascular Surgery

## 2011-04-29 ENCOUNTER — Other Ambulatory Visit (INDEPENDENT_AMBULATORY_CARE_PROVIDER_SITE_OTHER): Payer: Medicare Other

## 2011-04-29 VITALS — BP 157/83 | HR 57 | Temp 98.1°F | Resp 16 | Ht 69.0 in | Wt 188.0 lb

## 2011-04-29 DIAGNOSIS — I6529 Occlusion and stenosis of unspecified carotid artery: Secondary | ICD-10-CM

## 2011-04-29 NOTE — Progress Notes (Signed)
c/o increased pain w/ swallowing, and increased BP last night/ voiced concern of BP elevation causing a clot to break away from blood vessel./had carotid duplex today

## 2011-04-29 NOTE — Progress Notes (Signed)
[facility]  Postoperative Visit  History of Present Illness  Louis Barton is a 70 y.o. male who presents for postoperative follow-up for: R CEA by Dr. Darrick Penna (Date: 04/25/11).  The patient's neck incision is healing.  The patient has had no stroke or TIA symptoms.  He was told to comes in for evaluation by Dr. Myra Gianotti due to neck swelling and severe HTN.  He notes a small amount of right jaw line numbness of radicular pain  Physical Examination  Filed Vitals:   04/29/11 1059  BP: 157/83  Pulse:   Temp:   Resp:    R Neck: Incision is healing, clean dry and intact, some soft hematoma palpable PULM: no stridor, CTAB Neuro: CN 2-12 are intact, Motor strength is 5/5  bilaterally, sensation is grossly intact R carotid duplex: widely patent carotid artery  Medical Decision Making  Louis Barton is a 70 y.o. male who presents s/p R CEA.  The patient's neck incision is healing with a small soft hematoma present.  He has no signs of stroke currently.  His blood pressure is better today and he has yet to take his anti-HTN medications.  I reassured the patient that his postop course is appropriate.  I gave him precautions in regards to the hematoma in his right neck and any elevation in BP.  He know to contact us if his BP remains consistently > 150 and if his hematoma gets bigger.    He has regularly schedule follow up with Dr. Darrick Penna already.  Leonides Sake, MD [facility] Office: [phone] Pager: [phone]

## 2011-05-09 ENCOUNTER — Encounter: Payer: Self-pay | Admitting: Cardiovascular Disease

## 2011-05-10 NOTE — Procedures (Unsigned)
CAROTID DUPLEX EXAM  INDICATION:  Status post right carotid endarterectomy pain.  HISTORY: Diabetes:  No. Cardiac:  CAD. Hypertension:  Yes. Smoking:  Previous. Previous Surgery:  Right carotid endarterectomy on 04/25/2011; left carotid endarterectomy on 04/29/2004. CV History:  Asymptomatic; complains of left-sided neck/head pain with swallowing. Amaurosis Fugax No, Paresthesias No, Hemiparesis No                                      RIGHT             LEFT Brachial systolic pressure:         138               114 Brachial Doppler waveforms:         WNL               WNL Vertebral direction of flow:        Antegrade         Dampened and antegrade DUPLEX VELOCITIES (cm/sec) CCA peak systolic                   83                111 ECA peak systolic                   175               84 ICA peak systolic                   85                83 ICA end diastolic                   23                30 PLAQUE MORPHOLOGY:                  Not visualized    Heterogeneous PLAQUE AMOUNT:                      Minimal           Mild PLAQUE LOCATION:                    ECA               CCA/ECA  IMPRESSION: 1. Right internal carotid artery appears patent with history of     endarterectomy. 2. Right external carotid artery presents with elevated velocities     with vessel tortuosity present and a peak systolic velocity of 175     cm/s. 3. Left internal carotid artery is patent with history of     endarterectomy. 4. Dampened antegrade and left vertebral suggestive of high-grade     stenosis versus occlusion.  ___________________________________________ Janetta Hora Fields, MD  SH/MEDQ  D:  04/29/2011  T:  04/29/2011  Job:  [id number]

## 2011-05-12 ENCOUNTER — Ambulatory Visit (INDEPENDENT_AMBULATORY_CARE_PROVIDER_SITE_OTHER): Payer: Medicare Other | Admitting: Vascular Surgery

## 2011-05-12 ENCOUNTER — Encounter: Payer: Self-pay | Admitting: Vascular Surgery

## 2011-05-12 DIAGNOSIS — I6529 Occlusion and stenosis of unspecified carotid artery: Secondary | ICD-10-CM

## 2011-05-12 DIAGNOSIS — Z9889 Other specified postprocedural states: Secondary | ICD-10-CM

## 2011-05-12 NOTE — Progress Notes (Signed)
Subjective:     Patient ID: Louis Barton, male   DOB: April 04, 1941, 70 y.o.   MRN: [id number]  HPI Patient is status post right carotid endarterectomy on 04/25/2011. He had no postoperative problems. He denies any neurologic symptoms. He has no symptoms of TIA amaurosis or stroke. He is swallowing okay. He has no tongue problems. He continues to take aspirin 325 mg daily.  Review of Systems     Objective:   Physical Exam Right neck healing incision, no carotid bruit, 2+ carotid pulse      Assessment:     Doing well s/p right CEA    Plan:     Continue Aspirin, Follow up carotid duplex 6 months

## 2011-05-26 NOTE — Discharge Summary (Signed)
  NAMECHESLEY, Louis Barton NO.:  [id number]  MEDICAL RECORD NO.:  [id number]  LOCATION:  3304                         FACILITY:  [facility]  PHYSICIAN:  Janetta Hora. Fields, MD  DATE OF BIRTH:  June 23, 1941  DATE OF ADMISSION:  04/25/2011 DATE OF DISCHARGE:  04/26/2011                              DISCHARGE SUMMARY   ADMISSION DIAGNOSIS:  Right internal carotid artery stenosis.  HISTORY OF PRESENT ILLNESS:  This is a 70 year old male who underwent a left carotid endarterectomy in 2005.  Since then he has done well.  He returns to see Dr. Darrick Penna for his regularly scheduled followup.  His chronic medical problems include hypertension and hypercholesterolemia. He is on medication for these and are currently controlled and followed by Dr. Shary Decamp.  He is also on antiplatelet therapy in the form of aspirin.  Apparently Dr. Sherlyn Lick started him on metoprolol as well.  He reports no symptoms of TIA, amaurosis, or stroke.  He has been having some numbness in his right arm at night which is involving the fourth and fifth digits of his right hand.  This is intermittent in nature and does not occur consistently overnight.  It has never occurred during the daytime and he has no motor loss.  HOSPITAL COURSE:  The patient was admitted to the hospital and taken to the operating room.  On April 25, 2011, underwent a right carotid endarterectomy with Dacron patch angioplasty.  He tolerated the procedure well and was transported to the recovery room in satisfactory condition.  By postoperative day #1, he is doing well with the exception of some soreness.  His neuro exam is intact.  Otherwise his postoperative course included increasing ambulation as well as increasing intake of solids without difficulty.  DISCHARGE INSTRUCTIONS:  He is discharged home with extensive instructions on wound care and progressive ambulation.  He is instructed not to drive or perform any heavy lifting for 1  month.  DISCHARGE DIAGNOSES: 1. Right internal carotid artery stenosis.     a.     Status post right carotid endarterectomy April 25, 2011.     b.     History of left carotid endarterectomy in 2005. 2. Hypercholesterolemia. 3. Hypertension. 4. Peripheral arterial disease. 5. History of transient ischemic attacks. 6. History of thrombocytopenia.  DISCHARGE MEDICATIONS: 1. Oxycodone 5/325, 1 p.o. q.4-6 hours p.r.n. pain #30, no refill. 2. Aspirin 325 mg p.o. daily. 3. Crestor 40 mg p.o. daily. 4. Diovan HCT 320/25, 1 tablet p.o. daily. 5. WelChol 625 mg p.o. b.i.d.  FOLLOWUP:  The patient is to follow up with Dr. Darrick Penna in 2 weeks.     Newton Pigg, PA   ______________________________ Janetta Hora Fields, MD    SE/MEDQ  D:  04/26/2011  T:  04/26/2011  Job:  [id number]  Electronically Signed by Newton Pigg PA on 04/26/2011 03:04:25 PM Electronically Signed by Fabienne Bruns MD on 05/26/2011 78:29:56 PM

## 2011-05-26 NOTE — Op Note (Signed)
NAMEZAVIAN, SLOWEY NO.:  [id number]  MEDICAL RECORD NO.:  [id number]  LOCATION:  3304                         FACILITY:  [facility]  PHYSICIAN:  Janetta Hora. Fields, MD  DATE OF BIRTH:  [birth year]  DATE OF PROCEDURE:  04/25/2011 DATE OF DISCHARGE:                              OPERATIVE REPORT   PROCEDURE:  Right carotid endarterectomy.  PREOPERATIVE DIAGNOSIS:  High-grade right internal carotid artery stenosis.  POSTOPERATIVE DIAGNOSIS:  High-grade right internal carotid artery stenosis.  ANESTHESIA:  General.  ASSISTANT:  Newton Pigg, PA-C  OPERATIVE FINDINGS: 1. High carotid bifurcation. 2. Greater than 80% right internal carotid artery stenosis. 3. A 10-French shunt. 4. Dacron patch.  OPERATIVE DETAILS:  After obtaining informed consent, the patient was taken to the operating room.  The patient was placed in supine position on the operating room table.  After induction of general anesthesia and endotracheal intubation, the patient's entire right neck and chest were prepped and draped in usual sterile fashion.  Next, an oblique incision was made on the right side of the neck and extended down through the subcutaneous tissues into the platysma.  Sternocleidomastoid muscle was identified and reflected laterally.  Common facial vein was dissected free circumferentially and ligated and divided between silk ties.  The common carotid artery was dissected free at the base of the incision. The omohyoid muscle was taken down with cautery.  The vagus nerve was identified and protected.  Common carotid artery was dissected free circumferentially and an umbilical tape placed around this.  Dissection was then carried up to the level of the carotid bifurcation.  There was heavily calcified plaque at this level.  The external carotid and superior thyroid artery was dissected free circumferentially and vessel loops were placed around these.  The plaque  extended into the distal internal carotid artery several centimeters.  Due to a slightly high carotid bifurcation, it required some mobilization of the hypoglossal nerve.  The plaque extended up to and slightly under the hypoglossal nerve.  Therefore, several tethering vessels were taken down to free up and mobilize the hypoglossal nerve as well as division of the ansa cervicalis.  With this maneuver, I was able to dissect the internal carotid artery circumferentially just above the level of the lesion and vessel loops were placed around this.  The patient was given 7000 units of intravenous heparin.  The patient was given an additional 2000 units of heparin during the course of the case.  The patient's mean arterial pressure was raised to 85.  The distal internal carotid artery was controlled with a fine bulldog clamp.  The external carotid and common carotid artery was controlled with vascular clamps.  Longitudinal opening was made in the common carotid artery just below the carotid bifurcation and the arteriotomy extended through the plaque and stenosis into the internal carotid artery.  The stenosis was heavily calcified approximately 80%.  A 10-French shunt was then brought up in the operative field and threaded in the distal internal carotid artery and allowed to back bleed thoroughly.  The was then threaded into the common carotid artery and secured with a Rumel tourniquet.  Shunt was then reopened  with restoration of flow to the brain after approximately 5 minutes.  Next, endarterectomy was begun in a suitable plane near the carotid bifurcation.  A nice feathered proximal endpoint was obtained as well as a distal endpoint.  The external carotid artery was endarterectomized by eversion technique.  Dacron patch was then brought up on the operative field after all loose debris had been removed from the carotid bed.  Dacron patch was sewn as a patch angioplasty using running 6-0  Prolene suture.  Just prior to completion of the patch, the shunt was reoccluded and brought down to the level of the distal internal carotid artery and allowed to back bleed thoroughly.  This was then reoccluded with a fine bulldog clamp.  The shunt was then removed from the proximal common carotid artery and this was reoccluded with a peripheral DeBakey clamp.  The external carotid artery was thoroughly back bled and controlled with a vessel loop.  It was then thoroughly irrigated with heparinized saline.  The remainder of the patch was completed.  Flow was then first restored retrograde from the external carotid artery and then antegrade from the common carotid artery to the external carotid artery and after approximately five cardiac cycles to the internal carotid artery.  Doppler was used to evaluate the internal, external and common carotid arteries and these all had good flow. Hemostasis was obtained with administration of 90 mg of protamine under direct pressure.  Platysma muscles were reapproximated using running 3-0 Vicryl suture.  Skin was closed with 4-0 Vicryl subcuticular stitch. The patient tolerated the procedure well and there were no complications.  Instrument, sponge and needle counts were correct at the end of the case.  The patient was taken to the recovery room in stable condition following commands, moving upper extremities and lower extremities symmetrically.     Janetta Hora. Fields, MD     CEF/MEDQ  D:  04/26/2011  T:  04/26/2011  Job:  [id number]  cc:   Feliciana Rossetti, MD  Electronically Signed by Fabienne Bruns MD on 05/26/2011 06:26:57 PM

## 2011-11-17 ENCOUNTER — Other Ambulatory Visit: Payer: Medicare Other

## 2011-11-17 ENCOUNTER — Ambulatory Visit: Payer: Medicare Other | Admitting: Vascular Surgery

## 2011-11-30 ENCOUNTER — Encounter: Payer: Self-pay | Admitting: Vascular Surgery

## 2011-12-01 ENCOUNTER — Other Ambulatory Visit (INDEPENDENT_AMBULATORY_CARE_PROVIDER_SITE_OTHER): Payer: Medicare Other | Admitting: *Deleted

## 2011-12-01 ENCOUNTER — Encounter: Payer: Self-pay | Admitting: Vascular Surgery

## 2011-12-01 ENCOUNTER — Ambulatory Visit (INDEPENDENT_AMBULATORY_CARE_PROVIDER_SITE_OTHER): Payer: Medicare Other | Admitting: Vascular Surgery

## 2011-12-01 VITALS — BP 156/88 | HR 56 | Resp 98 | Ht 69.0 in | Wt 194.0 lb

## 2011-12-01 DIAGNOSIS — Z48812 Encounter for surgical aftercare following surgery on the circulatory system: Secondary | ICD-10-CM

## 2011-12-01 DIAGNOSIS — I6529 Occlusion and stenosis of unspecified carotid artery: Secondary | ICD-10-CM

## 2011-12-01 HISTORY — DX: Occlusion and stenosis of unspecified carotid artery: I65.29

## 2011-12-01 NOTE — Progress Notes (Signed)
[facility] [place] HISTORY AND PHYSICAL   History of Present Illness:  Patient is a 71 y.o. year old male who presents for follow-up evaluation for carotid stenosis.  He is on Aspirin for antiplatelet therapy.  His atherosclerotic risk factors remain elevated cholesterol, hypertension.  These are all currently stable and followed by his primary care physician.  He denies any new neurologic events including amaurosis, numbness, or weakness.  He previously underwent left carotid endarterectomy in 2005. He underwent right carotid endarterectomy in 2012. He denies any symptoms of amaurosis TIA or stroke.  Past Medical History  Diagnosis Date  . PAD (peripheral artery disease)   . History of TIAs   . History of thrombocytopenia   . Carotid stenosis   . Hyperlipidemia   . Hypertension     Past Surgical History  Procedure Date  . Septoplasty, nasal w/ submucosal resection 1960  . Carotid endarterectomy 2005    Left carotid by Dr. Darrick Penna  . Carotid endarterectomy 04/25/11    Right Carotid by Dr. Darrick Penna    Review of Systems:  Neurologic: as above Cardiac:denies shortness of breath or chest pain Pulmonary: denies cough or wheeze  Social History History  Substance Use Topics  . Smoking status: Former Smoker    Types: Cigarettes    Quit date: 09/26/1972  . Smokeless tobacco: Not on file  . Alcohol Use: 1.2 oz/week    2 Glasses of wine per week      1 Drinks of Scotch per Day    Allergies  No Known Allergies   Current Outpatient Prescriptions  Medication Sig Dispense Refill  . CLONAZEPAM PO Take by mouth as needed.        . clopidogrel (PLAVIX) 75 MG tablet Take 75 mg by mouth daily.      . colesevelam (WELCHOL) 625 MG tablet Take 1,875 mg by mouth 2 (two) times daily with a meal.        . rosuvastatin (CRESTOR) 40 MG tablet Take 1 tablet (40 mg total) by mouth daily.  30 tablet  6  . sulfamethoxazole-trimethoprim (BACTRIM DS) 800-160 MG per tablet         . valsartan-hydrochlorothiazide (DIOVAN-HCT) 320-25 MG per tablet Take 1 tablet by mouth daily.  30 tablet  6  . ZETIA 10 MG tablet TAKE 1 TABLET EVERY DAY  30 tablet  6  . aspirin 325 MG tablet Take 325 mg by mouth daily.        . metoprolol succinate (TOPROL-XL) 25 MG 24 hr tablet       . oxyCODONE-acetaminophen (PERCOCET) 5-325 MG per tablet Take 1 tablet by mouth. Take one tab every 4-6 hrs prn for pain         Physical Examination  Filed Vitals:   12/01/11 1559 12/01/11 1601  BP: 178/83 156/88  Pulse: 58 56  Resp: 98   Height: 5\' 9"  (1.753 m)   Weight: 194 lb (87.998 kg)   SpO2: 98% 99%    Body mass index is 28.65 kg/(m^2).  General:  Alert and oriented, no acute distress HEENT: Normal Neck: No bruit or JVD Pulmonary: Clear to auscultation bilaterally Cardiac: Regular Rate and Rhythm without murmur Neurologic: Upper and lower extremity motor 5/5 and symmetric  DATA: He had a carotid duplex exam today which I reviewed and interpreted. This showed bilateral patent internal carotid arteries with no recurrent stenosis.   ASSESSMENT: Patent internal carotid arteries bilaterally with no evidence of recurrent stenosis. The patient also  had several questions today regarding the benefits of Plavix versus aspirin versus combined Plavix and aspirin. I discussed with him the findings of the Angeline Slim study that there is some benefit with Plavix but this overall is fairly small compared aspirin alone. He also had questions regarding coronary artery disease about whether or not he should have a stress test a CT scan or a cardiac catheterization. I explained to him that the stress test and CT scan were noninvasive however they would not give as precise as information as a cardiac catheterization. He currently has no cardiac symptoms. He will think further and discuss with his cardiologist whether or not he wishes to proceed with further investigation of his coronary arteries. The primary reason he  is seeing his cardiologist and is evaluating this is to make sure that he is not at risk of having a cardiac event during exercise at the gym on a treadmill. He also had several questions regarding his hypertension. I advised that he should continue keeping his blood pressure at home and take these numbers to his primary care physician so that there are reliable numbers available when he visits with his primary physician. He was slightly hypertensive today but I did discuss with him that sometimes blood pressure measurements in the office can be more higher than usual due to the stress of the office visit. He will continue to keep a log of his blood pressure at home.   PLAN:  We will repeat his carotid duplex exam in 6 months time. If he has no significant stenosis at that time we'll probably go to once yearly.  Fabienne Bruns, MD [facility] [place] Office: [phone] Pager: [phone]

## 2011-12-15 NOTE — Procedures (Unsigned)
CAROTID DUPLEX EXAM  INDICATION:  Follow up carotid artery disease.  HISTORY: Diabetes:  No. Cardiac:  Coronary artery disease. Hypertension:  Yes. Smoking:  Previously. Previous Surgery:  Right carotid endarterectomy 04/25/2011; left carotid endarterectomy 04/29/2004. CV History:  Asymptomatic. Amaurosis [place] No, Paresthesias No, Hemiparesis No                                      RIGHT             LEFT Brachial systolic pressure:         150               144 Brachial Doppler waveforms:         Triphasic.        Triphasic. Vertebral direction of flow:        Antegrade.        Antegrade, dampened. DUPLEX VELOCITIES (cm/sec) CCA peak systolic                   104               113 ECA peak systolic                   186, curve        99 ICA peak systolic                   85, distal        72, mid ICA end diastolic                   33                26 PLAQUE MORPHOLOGY:                  Soft              Soft PLAQUE AMOUNT:                      Mild              Mild PLAQUE LOCATION:                    ECA               Proximal ICA, CCA, bifurcation  IMPRESSION: 1. Bilateral carotid endarterectomy sites appear patent with no     evidence for restenosis. 2. Increased right external carotid artery velocity at area of curve. 3. Dampened left vertebral artery flow. 4. No significant change since previous study of 04/29/2011.  ___________________________________________ Janetta Hora. Fields, MD  SS/MEDQ  D:  12/01/2011  T:  12/01/2011  Job:  [id number]

## 2011-12-19 ENCOUNTER — Encounter: Payer: Medicare Other | Admitting: Cardiovascular Disease

## 2012-06-01 ENCOUNTER — Other Ambulatory Visit: Payer: Self-pay | Admitting: *Deleted

## 2012-06-01 DIAGNOSIS — I6529 Occlusion and stenosis of unspecified carotid artery: Secondary | ICD-10-CM

## 2012-06-01 DIAGNOSIS — Z48812 Encounter for surgical aftercare following surgery on the circulatory system: Secondary | ICD-10-CM

## 2012-06-06 ENCOUNTER — Encounter: Payer: Self-pay | Admitting: Vascular Surgery

## 2012-06-07 ENCOUNTER — Encounter: Payer: Self-pay | Admitting: Vascular Surgery

## 2012-06-07 ENCOUNTER — Ambulatory Visit (INDEPENDENT_AMBULATORY_CARE_PROVIDER_SITE_OTHER): Payer: Medicare Other | Admitting: Vascular Surgery

## 2012-06-07 ENCOUNTER — Other Ambulatory Visit (INDEPENDENT_AMBULATORY_CARE_PROVIDER_SITE_OTHER): Payer: Medicare Other | Admitting: *Deleted

## 2012-06-07 VITALS — BP 143/80 | HR 55 | Resp 16 | Ht 69.0 in | Wt 185.5 lb

## 2012-06-07 DIAGNOSIS — Z48812 Encounter for surgical aftercare following surgery on the circulatory system: Secondary | ICD-10-CM

## 2012-06-07 DIAGNOSIS — I6529 Occlusion and stenosis of unspecified carotid artery: Secondary | ICD-10-CM

## 2012-06-07 NOTE — Progress Notes (Signed)
Patient is a 71 year old male who returns for followup today. He previously underwent right carotid endarterectomy in July 2012. He also underwent left carotid endarterectomy in August of 2005. He denies any symptoms of TIA amaurosis or stroke. Overall he remains very active. He continues to take aspirin. Chronic medical problems remain elevated cholesterol hypertension. These are currently stable and followed by his primary care physician.  Review of systems: He denies shortness of breath. He denies chest pain.  Past Medical History  Diagnosis Date  . PAD (peripheral artery disease)   . History of TIAs   . History of thrombocytopenia   . Carotid stenosis   . Hyperlipidemia   . Hypertension   . CAD (coronary artery disease)   . Stroke      Physical exam: Filed Vitals:   06/07/12 1034 06/07/12 1035  BP: 145/79 143/80  Pulse: 54 55  Resp: 16   Height: 5\' 9"  (1.753 m)   Weight: 185 lb 8 oz (84.142 kg)   SpO2: 100%    Neck: No carotid bruit  Chest: Clear to auscultation bilaterally  Cardiac: Regular rate and rhythm  Neuro: Upper extremity and lower extremity motor strength are 5 and symmetric  Data: Carotid duplex exam today which I reviewed and interpreted. This showed some resistive flow to the left vertebral artery. Bilateral carotid endarterectomies were widely patent. Right vertebral artery had antegrade flow.  Assessment: Bilateral patent carotid endarterectomies asymptomatic from neurologic standpoint  Plan: Followup carotid duplex exam 6 months continue aspirin therapy  Fabienne Bruns, MD [facility] [place] Office: [phone] Pager: [phone]

## 2012-12-06 ENCOUNTER — Other Ambulatory Visit: Payer: Medicare Other

## 2012-12-06 ENCOUNTER — Ambulatory Visit: Payer: Medicare Other | Admitting: Vascular Surgery

## 2012-12-12 ENCOUNTER — Encounter: Payer: Self-pay | Admitting: Vascular Surgery

## 2012-12-13 ENCOUNTER — Other Ambulatory Visit: Payer: Self-pay

## 2012-12-13 ENCOUNTER — Encounter: Payer: Self-pay | Admitting: Vascular Surgery

## 2012-12-13 ENCOUNTER — Ambulatory Visit (INDEPENDENT_AMBULATORY_CARE_PROVIDER_SITE_OTHER): Payer: Medicare Other | Admitting: Vascular Surgery

## 2012-12-13 ENCOUNTER — Other Ambulatory Visit (INDEPENDENT_AMBULATORY_CARE_PROVIDER_SITE_OTHER): Payer: Medicare Other | Admitting: *Deleted

## 2012-12-13 DIAGNOSIS — I6529 Occlusion and stenosis of unspecified carotid artery: Secondary | ICD-10-CM

## 2012-12-13 DIAGNOSIS — Z48812 Encounter for surgical aftercare following surgery on the circulatory system: Secondary | ICD-10-CM

## 2012-12-13 NOTE — Progress Notes (Signed)
VASCULAR AND VE[facility] PROGRESS NOTE  Progress note      HPI: Louis Barton is a 72 y.o. male who had right carotid endarterectomy in July 2012.  He under left carotid endarterectomy in 2005.  He denies symptoms of stroke.  No weakness, TIA, and amaurosis.  He continues to take Plavix and Crestor daily.   He has a new complaint of thigh pain right greater than left after sitting for extended time periods.  He has to stop and hold on to something and rest then start walking.  He has a history of right groin ganglion cyst and bone spur on the base of his spine.   Past Medical History   Diagnosis  Date   .  PAD (peripheral artery disease)    .  History of TIAs    .  History of thrombocytopenia    .  Carotid stenosis    .  Hyperlipidemia    .  Hypertension      Past Surgical History  Procedure  Date  .  Septoplasty, nasal w/ submucosal resection  1960  .  Carotid endarterectomy  2005  Left carotid by Dr. Darrick Penna  .  Carotid endarterectomy  04/25/11  Right Carotid by Dr. Darrick Penna     Duplex shows right 0.45 left 0.57 No significant stnosis.  Current Outpatient Prescriptions on File Prior to Visit  Medication Sig Dispense Refill  . CLONAZEPAM PO Take by mouth as needed.        . clopidogrel (PLAVIX) 75 MG tablet Take 75 mg by mouth daily.      . colesevelam (WELCHOL) 625 MG tablet Take 1,875 mg by mouth 2 (two) times daily with a meal.        . DIOVAN HCT 160-25 MG per tablet Take by mouth daily.      . rosuvastatin (CRESTOR) 40 MG tablet Take 1 tablet (40 mg total) by mouth daily.  30 tablet  6  . valsartan-hydrochlorothiazide (DIOVAN-HCT) 320-25 MG per tablet Take 1 tablet by mouth daily.  30 tablet  6  . ZETIA 10 MG tablet TAKE 1 TABLET EVERY DAY  30 tablet  6  . aspirin 325 MG tablet Take 81 mg by mouth daily.       . clobetasol (TEMOVATE) 0.05 % external solution Apply 1 application topically as needed.      . metoprolol succinate (TOPROL-XL) 25 MG 24 hr tablet        . oxyCODONE-acetaminophen (PERCOCET) 5-325 MG per tablet Take 1 tablet by mouth. Take one tab every 4-6 hrs prn for pain       . sulfamethoxazole-trimethoprim (BACTRIM DS) 800-160 MG per tablet        No current facility-administered medications on file prior to visit.     Physical Examination  Filed Vitals:   12/13/12 1513 12/13/12 1520  BP: 121/69 108/68  Pulse: 58 60  Resp: 16   Height: 5\' 9"  (1.753 m)   Weight: 190 lb (86.183 kg)   SpO2: 95%   No carotid bruit Palpable radial pulses bilateral.  Regularly irregular Strength upper extremity 5/5 No tongue deviation pupils equal speech fluent.  Palpable PT popliteal pulses and femoral bilaterally    Assessment/Plan  Louis Barton is a 72 y.o. year old male who is S/P bilateral carotid endarterectomy.  F/U in 1 year for duplex carotids. Back is probably source for his anterior thigh pain.  Doubt arterial cause.  Will obtain records and scans from his Dr.  At [facility] if problems persist  Irregular heart beat being evaluated by his cardiologist   Clinton Gallant [facility] 12/13/2012 3:26 PM   History and exam details as above.  Will see again in 1 year.  Continue Plavix  Fabienne Bruns, MD [facility] [place] Office: [phone] Pager: [phone]

## 2013-02-07 ENCOUNTER — Encounter: Payer: Medicare Other | Admitting: Cardiovascular Disease

## 2013-10-31 ENCOUNTER — Other Ambulatory Visit: Payer: Self-pay | Admitting: Vascular Surgery

## 2013-10-31 DIAGNOSIS — I6529 Occlusion and stenosis of unspecified carotid artery: Secondary | ICD-10-CM

## 2013-10-31 DIAGNOSIS — Z48812 Encounter for surgical aftercare following surgery on the circulatory system: Secondary | ICD-10-CM

## 2013-12-12 ENCOUNTER — Ambulatory Visit: Payer: Medicare Other | Admitting: Vascular Surgery

## 2013-12-12 ENCOUNTER — Other Ambulatory Visit (HOSPITAL_COMMUNITY): Payer: Medicare Other

## 2013-12-18 ENCOUNTER — Encounter: Payer: Self-pay | Admitting: Vascular Surgery

## 2013-12-19 ENCOUNTER — Ambulatory Visit (HOSPITAL_COMMUNITY)
Admission: RE | Admit: 2013-12-19 | Discharge: 2013-12-19 | Disposition: A | Discharge status: Home / Self Care | Payer: Medicare Other | Source: Ambulatory Visit | Attending: Family | Admitting: Family

## 2013-12-19 ENCOUNTER — Ambulatory Visit (INDEPENDENT_AMBULATORY_CARE_PROVIDER_SITE_OTHER): Payer: Medicare Other | Admitting: Family

## 2013-12-19 ENCOUNTER — Encounter: Payer: Self-pay | Admitting: Family

## 2013-12-19 VITALS — BP 132/82 | HR 53 | Resp 14 | Ht 69.0 in | Wt 192.0 lb

## 2013-12-19 DIAGNOSIS — I6529 Occlusion and stenosis of unspecified carotid artery: Secondary | ICD-10-CM

## 2013-12-19 DIAGNOSIS — Z48812 Encounter for surgical aftercare following surgery on the circulatory system: Secondary | ICD-10-CM | POA: Insufficient documentation

## 2013-12-19 HISTORY — DX: Encounter for surgical aftercare following surgery on the circulatory system: Z48.812

## 2013-12-19 NOTE — Addendum Note (Signed)
Addended by: Mena Goes on: 12/19/2013 04:06 PM   Modules accepted: Orders

## 2013-12-19 NOTE — Patient Instructions (Signed)

## 2013-12-19 NOTE — Progress Notes (Signed)
Established Carotid Patient   History of Present Illness  Louis Barton is a 73 y.o. male who is s/p right CEA on 04/25/2011 and left CEA on 04/29/2004. He returns today for follow up.  Patient has Positive history of TIA in July, 2005, symptom before the first CEA, as manifested by left amaurosis fugax, right arm weakness, right facial droop; denies expressive aphasia, denies right leg weakness; all symptoms resolved in about 2 minutes.    Patient reports New Medical or Surgical History: ongoing back problems. He denies claudication symptoms, deneis aching behind knees. He does have radiculopathy in right anterior thigh at times. Exercises 3-4x/week.  Pt Diabetic: No Pt smoker: former smoker, quit in 1974  Pt meds include: Statin : Yes ASA: Yes Other anticoagulants/antiplatelets: Plavix   Past Medical History  Diagnosis Date  . PAD (peripheral artery disease)   . History of TIAs   . History of thrombocytopenia   . Carotid stenosis   . Hyperlipidemia   . Hypertension   . CAD (coronary artery disease)   . Stroke     Social History History  Substance Use Topics  . Smoking status: Former Smoker    Types: Cigarettes    Quit date: 09/26/1972  . Smokeless tobacco: Never Used  . Alcohol Use: 1.2 oz/week    2 Glasses of wine per week     Comment:  1 Drinks of Scotch per Day    Family History Family History  Problem Relation Age of Onset  . Cancer Mother 58    Breast  . Stroke Mother   . Diabetes Mother   . Hyperlipidemia Mother   . Other Mother     varicose veins  . Heart attack Mother   . Heart disease Mother     Left ankle swelling  . Hypertension Mother   . Deep vein thrombosis Mother   . Diabetes Father   . Heart attack Father   . Cancer Brother     BCC-SCC-Merkle Cell    Surgical History Past Surgical History  Procedure Laterality Date  . Septoplasty, nasal w/ submucosal resection  1960  . Carotid endarterectomy  2005    Left carotid by Dr. Oneida Alar   . Carotid endarterectomy  04/25/11    Right Carotid by Dr. Oneida Alar    No Known Allergies  Current Outpatient Prescriptions  Medication Sig Dispense Refill  . aspirin 325 MG tablet Take 81 mg by mouth daily.       . clobetasol (TEMOVATE) 0.05 % external solution Apply 1 application topically as needed.      [name] CLONAZEPAM PO Take by mouth as needed.        . clopidogrel (PLAVIX) 75 MG tablet Take 75 mg by mouth daily.      . colesevelam (WELCHOL) 625 MG tablet Take 1,875 mg by mouth 2 (two) times daily with a meal.        . DIOVAN 160 MG tablet daily.      [name] DIOVAN HCT 160-25 MG per tablet Take by mouth daily.      . hydrochlorothiazide (HYDRODIURIL) 25 MG tablet daily.      . rosuvastatin (CRESTOR) 40 MG tablet Take 1 tablet (40 mg total) by mouth daily.  30 tablet  6  . sulfamethoxazole-trimethoprim (BACTRIM DS) 800-160 MG per tablet       . valsartan-hydrochlorothiazide (DIOVAN-HCT) 320-25 MG per tablet Take 1 tablet by mouth daily.  30 tablet  6  . ZETIA 10 MG tablet TAKE 1  TABLET EVERY DAY  30 tablet  6  . metoprolol succinate (TOPROL-XL) 25 MG 24 hr tablet       . oxyCODONE-acetaminophen (PERCOCET) 5-325 MG per tablet Take 1 tablet by mouth. Take one tab every 4-6 hrs prn for pain        No current facility-administered medications for this visit.    Review of Systems : See HPI for pertinent positives and negatives.  Physical Examination  Filed Vitals:   12/19/13 1245  BP: 132/82  Pulse: 53  Resp: 14   Filed Weights   12/19/13 1245  Weight: 192 lb (87.091 kg)   Body mass index is 28.34 kg/(m^2).  General: WDWN male in NAD GAIT: normal Eyes: PERRLA Pulmonary:  Non-labored, CTAB, Negative  Rales, Negative rhonchi, & Negative wheezing.  Cardiac: regular Rhythm ,  Negative detected murmur.  VASCULAR EXAM Carotid Bruits Left Right   Negative Negative     Radial pulses are 2+ palpable and equal.                                                                                                                             LE Pulses LEFT RIGHT       POPLITEAL   palpable   not palpable    Gastrointestinal: soft, nontender, BS WNL, no r/g,  negative masses.  Musculoskeletal: Negative muscle atrophy/wasting. M/S 5/5 throughout, Extremities without ischemic changes.  Neurologic: A&O X 3; Appropriate Affect ; SENSATION ;normal;  Speech is normal CN 2-12 intact, Pain and light touch intact in extremities, Motor exam as listed above.   Non-Invasive Vascular Imaging CAROTID DUPLEX 12/19/2013   CEREBROVASCULAR DUPLEX EVALUATION    INDICATION: Carotid artery disease     PREVIOUS INTERVENTION(S): Right carotid endarterectomy 04/25/2011 Left carotid endarterectomy 04/29/2004    DUPLEX EXAM:     RIGHT  LEFT  Peak Systolic Velocities (cm/s) End Diastolic Velocities (cm/s) Plaque LOCATION Peak Systolic Velocities (cm/s) End Diastolic Velocities (cm/s) Plaque  81 19  CCA PROXIMAL 90 17   74 15 HT CCA MID 89 17 HT  58 10 HM CCA DISTAL 81 9   174 13  ECA 77 6   72 19  ICA PROXIMAL 29 9   60 19  ICA MID 62 20   95 30  ICA DISTAL 64 21      ICA / CCA Ratio (PSV)   Not visualized Vertebral Flow Not visualized  782 Brachial Systolic Pressure (mmHg) 956  Biphasic  Brachial Artery Waveforms Biphasic     Plaque Morphology:  HM = Homogeneous, HT = Heterogeneous, CP = Calcific Plaque, SP = Smooth Plaque, IP = Irregular Plaque     ADDITIONAL FINDINGS:     IMPRESSION:  Patent right carotid endarterectomy site with no evidence of hyperplasia or restenosis.  Patent left carotid endarterectomy site with no evidence of hyperplasia or restenosis.    Compared to the previous exam:  No significant change in comparison to the  last exam on 12/13/2012.      Assessment: Louis Barton is a 73 y.o. male who is s/p right CEA on 04/25/2011 and left CEA on 04/29/2004.  He has not had any further stroke or TIA activity since 2005 before his first CEA. Both ICA's are patent with  no evidence of hyperplasia or restenosis.  No significant change in comparison to the last exam on 12/13/2012.   Plan: Follow-up in 1 year with Carotid Duplex scan.   I discussed in depth with the patient the nature of atherosclerosis, and emphasized the importance of maximal medical management including strict control of blood pressure, blood glucose, and lipid levels, obtaining regular exercise, and continued cessation of smoking.  The patient is aware that without maximal medical management the underlying atherosclerotic disease process will progress, limiting the benefit of any interventions. The patient was given information about stroke prevention and what symptoms should prompt the patient to seek immediate medical care. Thank you for allowing [place] to participate in this patient's care.  Clemon Chambers, RN, MSN, FNP-C [facility] Office: [phone]  Clinic Physician: Oneida Alar  12/19/2013 12:55 PM

## 2014-12-31 ENCOUNTER — Encounter: Payer: Self-pay | Admitting: Family

## 2015-01-01 ENCOUNTER — Ambulatory Visit (HOSPITAL_COMMUNITY)
Admission: RE | Admit: 2015-01-01 | Discharge: 2015-01-01 | Disposition: A | Discharge status: Home / Self Care | Payer: Medicare Other | Source: Ambulatory Visit | Attending: Family | Admitting: Family

## 2015-01-01 ENCOUNTER — Ambulatory Visit (INDEPENDENT_AMBULATORY_CARE_PROVIDER_SITE_OTHER): Payer: Medicare Other | Admitting: Family

## 2015-01-01 ENCOUNTER — Encounter: Payer: Self-pay | Admitting: Family

## 2015-01-01 VITALS — BP 124/67 | HR 54 | Resp 14 | Ht 69.0 in | Wt 189.0 lb

## 2015-01-01 DIAGNOSIS — R252 Cramp and spasm: Secondary | ICD-10-CM

## 2015-01-01 DIAGNOSIS — I6523 Occlusion and stenosis of bilateral carotid arteries: Secondary | ICD-10-CM

## 2015-01-01 DIAGNOSIS — Z9889 Other specified postprocedural states: Secondary | ICD-10-CM

## 2015-01-01 DIAGNOSIS — I6522 Occlusion and stenosis of left carotid artery: Secondary | ICD-10-CM | POA: Diagnosis not present

## 2015-01-01 DIAGNOSIS — Z48812 Encounter for surgical aftercare following surgery on the circulatory system: Secondary | ICD-10-CM

## 2015-01-01 DIAGNOSIS — Z87891 Personal history of nicotine dependence: Secondary | ICD-10-CM

## 2015-01-01 DIAGNOSIS — M7989 Other specified soft tissue disorders: Secondary | ICD-10-CM | POA: Insufficient documentation

## 2015-01-01 HISTORY — DX: Other specified soft tissue disorders: M79.89

## 2015-01-01 HISTORY — DX: Cramp and spasm: R25.2

## 2015-01-01 NOTE — Patient Instructions (Signed)
Stroke Prevention Some medical conditions and behaviors are associated with an increased chance of having a stroke. You may prevent a stroke by making healthy choices and managing medical conditions. HOW CAN I REDUCE MY RISK OF HAVING A STROKE?   Stay physically active. Get at least 30 minutes of activity on most or all days.  Do not smoke. It may also be helpful to avoid exposure to secondhand smoke.  Limit alcohol use. Moderate alcohol use is considered to be:  No more than 2 drinks per day for men.  No more than 1 drink per day for nonpregnant women.  Eat healthy foods. This involves:  Eating 5 or more servings of fruits and vegetables a day.  Making dietary changes that address high blood pressure (hypertension), high cholesterol, diabetes, or obesity.  Manage your cholesterol levels.  Making food choices that are high in fiber and low in saturated fat, trans fat, and cholesterol may control cholesterol levels.  Take any prescribed medicines to control cholesterol as directed by your health care provider.  Manage your diabetes.  Controlling your carbohydrate and sugar intake is recommended to manage diabetes.  Take any prescribed medicines to control diabetes as directed by your health care provider.  Control your hypertension.  Making food choices that are low in salt (sodium), saturated fat, trans fat, and cholesterol is recommended to manage hypertension.  Take any prescribed medicines to control hypertension as directed by your health care provider.  Maintain a healthy weight.  Reducing calorie intake and making food choices that are low in sodium, saturated fat, trans fat, and cholesterol are recommended to manage weight.  Stop drug abuse.  Avoid taking birth control pills.  Talk to your health care provider about the risks of taking birth control pills if you are over 35 years old, smoke, get migraines, or have ever had a blood clot.  Get evaluated for sleep  disorders (sleep apnea).  Talk to your health care provider about getting a sleep evaluation if you snore a lot or have excessive sleepiness.  Take medicines only as directed by your health care provider.  For some people, aspirin or blood thinners (anticoagulants) are helpful in reducing the risk of forming abnormal blood clots that can lead to stroke. If you have the irregular heart rhythm of atrial fibrillation, you should be on a blood thinner unless there is a good reason you cannot take them.  Understand all your medicine instructions.  Make sure that other conditions (such as anemia or atherosclerosis) are addressed. SEEK IMMEDIATE MEDICAL CARE IF:   You have sudden weakness or numbness of the face, arm, or leg, especially on one side of the body.  Your face or eyelid droops to one side.  You have sudden confusion.  You have trouble speaking (aphasia) or understanding.  You have sudden trouble seeing in one or both eyes.  You have sudden trouble walking.  You have dizziness.  You have a loss of balance or coordination.  You have a sudden, severe headache with no known cause.  You have new chest pain or an irregular heartbeat. Any of these symptoms may represent a serious problem that is an emergency. Do not wait to see if the symptoms will go away. Get medical help at once. Call your local emergency services (911 in [place] Do not drive yourself to the hospital. Document Released: 10/20/2004 Document Revised: 01/27/2014 Document Reviewed: 03/15/2013 [employer] Patient Information 2015 [employer]. This information is not intended to replace advice given   to you by your health care provider. Make sure you discuss any questions you have with your health care provider.  

## 2015-01-01 NOTE — Addendum Note (Signed)
Addended by: Mena Goes on: 01/01/2015 03:32 PM   Modules accepted: Orders

## 2015-01-01 NOTE — Progress Notes (Signed)
Established Carotid Patient   History of Present Illness  Louis Barton is a 74 y.o. male s/p right CEA on 04/25/2011 and left CEA on 04/29/2004. He returns today for follow up.  Patient has Positive history of TIA in July, 2005, symptom before the first CEA, as manifested by left amaurosis fugax, right arm weakness, right facial droop; denies expressive aphasia, denies right leg weakness; all symptoms resolved in about 2 minutes.  Patient reports New Medical or Surgical History: ongoing back problems since age 71, he was evaluated by a neurosurgeon, has had ESI's in the remote past. He had a night time severe cramp in his left calf recently. He denies claudication symptoms with walking. He does have radiculopathy in right anterior thigh at times. Was exercising 3-4x/week, stopped to see if his back issues would be helped, his back pain then worsened, states he will resume exercise.  Pt Diabetic: No Pt smoker: former smoker, quit in 1974  Pt meds include: Statin : Yes ASA: Yes Other anticoagulants/antiplatelets: Plavix   Past Medical History  Diagnosis Date  . PAD (peripheral artery disease)   . History of TIAs   . History of thrombocytopenia   . Carotid stenosis   . Hyperlipidemia   . Hypertension   . CAD (coronary artery disease)   . Stroke     Social History History  Substance Use Topics  . Smoking status: Former Smoker    Types: Cigarettes    Quit date: 09/26/1972  . Smokeless tobacco: Never Used  . Alcohol Use: 1.2 oz/week    2 Glasses of wine per week     Comment:  1 Drinks of Scotch per Day    Family History Family History  Problem Relation Age of Onset  . Cancer Mother 11    Breast  . Stroke Mother   . Diabetes Mother   . Hyperlipidemia Mother   . Other Mother     varicose veins  . Heart attack Mother   . Heart disease Mother     Left ankle swelling  . Hypertension Mother   . Deep vein thrombosis Mother   . Varicose Veins Mother   . Diabetes  Father   . Heart attack Father   . Cancer Brother     BCC-SCC-Merkle Cell  . Other Sister     Tumor in [place]  . Cancer Sister     Tumor   Lung  and  Brain    Surgical History Past Surgical History  Procedure Laterality Date  . Septoplasty, nasal w/ submucosal resection  1960  . Carotid endarterectomy  2005    Left carotid by Dr. Oneida Alar  . Carotid endarterectomy  04/25/11    Right Carotid by Dr. Oneida Alar    No Known Allergies  Current Outpatient Prescriptions  Medication Sig Dispense Refill  . aspirin 325 MG tablet Take 81 mg by mouth daily.     . clobetasol (TEMOVATE) 0.05 % external solution Apply 1 application topically as needed.    [name] CLONAZEPAM PO Take by mouth as needed.      . clopidogrel (PLAVIX) 75 MG tablet Take 75 mg by mouth daily.    . colesevelam (WELCHOL) 625 MG tablet Take 1,875 mg by mouth 2 (two) times daily with a meal.      . DIOVAN HCT 160-25 MG per tablet Take by mouth daily.    . hydrochlorothiazide (HYDRODIURIL) 25 MG tablet daily.    . rosuvastatin (CRESTOR) 40 MG tablet Take 1  tablet (40 mg total) by mouth daily. 30 tablet 6  . sulfamethoxazole-trimethoprim (BACTRIM DS) 800-160 MG per tablet     . DIOVAN 160 MG tablet 160 mg daily. VALSARTAN  160 MG  ONE  Tablet daily    . metoprolol succinate (TOPROL-XL) 25 MG 24 hr tablet     . oxyCODONE-acetaminophen (PERCOCET) 5-325 MG per tablet Take 1 tablet by mouth. Take one tab every 4-6 hrs prn for pain     . valsartan-hydrochlorothiazide (DIOVAN-HCT) 320-25 MG per tablet Take 1 tablet by mouth daily. (Patient not taking: Reported on 01/01/2015) 30 tablet 6  . ZETIA 10 MG tablet TAKE 1 TABLET EVERY DAY (Patient not taking: Reported on 01/01/2015) 30 tablet 6   No current facility-administered medications for this visit.    Review of Systems : See HPI for pertinent positives and negatives.  Physical Examination  Filed Vitals:   01/01/15 0930 01/01/15 0932  BP: 130/77 124/67  Pulse: 53 54  Resp:   14  Height:  5\' 9"  (1.753 m)  Weight:  189 lb (85.73 kg)  SpO2:  100%   Body mass index is 27.9 kg/(m^2).  General: WDWN male in NAD GAIT: normal Eyes: PERRLA Pulmonary: Non-labored, CTAB, no rales, no rhonchi, & no wheezing.  Cardiac: regular Rhythm, no detected murmur.  VASCULAR EXAM Carotid Bruits Left Right   Negative Negative   Aorta is not palpable Radial pulses are 2+ palpable and equal.      LE Pulses LEFT RIGHT       FEMORAL 3+ palpable 3+ palpable   POPLITEAL  palpable  not palpable       POSTERIOR TIBIAL 2+ palpable 2+ palpable       DORSALIS PEDIS Not palpable Not palpable    Gastrointestinal: soft, nontender, BS WNL, no r/g, no palpable masses.  Musculoskeletal: Negative muscle atrophy/wasting. M/S 5/5 throughout, Extremities without ischemic changes. Trace pitting edema in left ankle, wearing knee high compression hose.  Neurologic: A&O X 3; Appropriate Affect,  Speech is normal CN 2-12 intact, Pain and light touch intact in extremities, Motor exam as listed above.         Non-Invasive Vascular Imaging CAROTID DUPLEX 01/01/2015   CEREBROVASCULAR DUPLEX EVALUATION    INDICATION: Carotid artery disease    PREVIOUS INTERVENTION(S): Right carotid endarterectomy 04/25/2011, left carotid endarterectomy 04/29/2004    DUPLEX EXAM: Carotid duplex    RIGHT  LEFT  Peak Systolic Velocities (cm/s) End Diastolic Velocities (cm/s) Plaque LOCATION Peak Systolic Velocities (cm/s) End Diastolic Velocities (cm/s) Plaque  80 14 HT CCA PROXIMAL 102 22 HT  99 21 HT CCA MID 107 24 HT  75 17 HT CCA DISTAL 99 19 HT  63 8 - ECA 350 0 -  78 24 - ICA PROXIMAL 64 19 HT  96 28 - ICA MID 81 27 -  99 24 - ICA DISTAL 62 19 -    NA ICA / CCA Ratio (PSV) NA  Antegrade Vertebral Flow Antegrade damp  185 Brachial Systolic  Pressure (mmHg) 130  Triphasic Brachial Artery Waveforms Triphasic    Plaque Morphology:  HM = Homogeneous, HT = Heterogeneous, CP = Calcific Plaque, SP = Smooth Plaque, IP = Irregular Plaque  ADDITIONAL FINDINGS:     IMPRESSION: 1. Patent bilateral endarterectomy sites with no evidence of restenosis. 2. Left external carotid artery stenosis    Compared to the previous exam:  Increase of External carotid artery velocity      Assessment: Louis Barton is a  74 y.o. male who is s/p right CEA on 04/25/2011 and left CEA on 04/29/2004.  He has not had any further stroke or TIA activity since 2005 before his first CEA. Both ICA's are patent with no evidence of hyperplasia or restenosis.  No significant change in comparison to the last Duplex a year ago other than increase of external carotid artery velocity.   Plan: Follow-up in 1 year with Carotid Duplex.   I discussed in depth with the patient the nature of atherosclerosis, and emphasized the importance of maximal medical management including strict control of blood pressure, blood glucose, and lipid levels, obtaining regular exercise, and continued cessation of smoking.  The patient is aware that without maximal medical management the underlying atherosclerotic disease process will progress, limiting the benefit of any interventions. The patient was given information about stroke prevention and what symptoms should prompt the patient to seek immediate medical care. Thank you for allowing [place] to participate in this patient's care.  Clemon Chambers, RN, MSN, FNP-C [facility] Office: [phone]  Clinic Physician: Oneida Alar  01/01/2015 9:49 AM

## 2015-03-18 DIAGNOSIS — R001 Bradycardia, unspecified: Secondary | ICD-10-CM | POA: Insufficient documentation

## 2015-03-18 DIAGNOSIS — I739 Peripheral vascular disease, unspecified: Secondary | ICD-10-CM | POA: Insufficient documentation

## 2015-03-18 DIAGNOSIS — E785 Hyperlipidemia, unspecified: Secondary | ICD-10-CM | POA: Insufficient documentation

## 2015-03-18 HISTORY — DX: Hyperlipidemia, unspecified: E78.5

## 2015-03-18 HISTORY — DX: Bradycardia, unspecified: R00.1

## 2015-06-11 DIAGNOSIS — R0789 Other chest pain: Secondary | ICD-10-CM

## 2015-06-11 HISTORY — DX: Other chest pain: R07.89

## 2015-11-16 DIAGNOSIS — D126 Benign neoplasm of colon, unspecified: Secondary | ICD-10-CM | POA: Insufficient documentation

## 2015-11-16 DIAGNOSIS — E785 Hyperlipidemia, unspecified: Secondary | ICD-10-CM | POA: Insufficient documentation

## 2015-11-16 DIAGNOSIS — R609 Edema, unspecified: Secondary | ICD-10-CM | POA: Insufficient documentation

## 2015-11-16 DIAGNOSIS — D696 Thrombocytopenia, unspecified: Secondary | ICD-10-CM

## 2015-11-16 DIAGNOSIS — M5136 Other intervertebral disc degeneration, lumbar region: Secondary | ICD-10-CM

## 2015-11-16 DIAGNOSIS — G96198 Other disorders of meninges, not elsewhere classified: Secondary | ICD-10-CM | POA: Insufficient documentation

## 2015-11-16 DIAGNOSIS — I2510 Atherosclerotic heart disease of native coronary artery without angina pectoris: Secondary | ICD-10-CM

## 2015-11-16 DIAGNOSIS — N529 Male erectile dysfunction, unspecified: Secondary | ICD-10-CM

## 2015-11-16 DIAGNOSIS — M51369 Other intervertebral disc degeneration, lumbar region without mention of lumbar back pain or lower extremity pain: Secondary | ICD-10-CM | POA: Insufficient documentation

## 2015-11-16 DIAGNOSIS — M24859 Other specific joint derangements of unspecified hip, not elsewhere classified: Secondary | ICD-10-CM

## 2015-11-16 DIAGNOSIS — E291 Testicular hypofunction: Secondary | ICD-10-CM

## 2015-11-16 DIAGNOSIS — I7090 Unspecified atherosclerosis: Secondary | ICD-10-CM | POA: Insufficient documentation

## 2015-11-16 HISTORY — DX: Atherosclerotic heart disease of native coronary artery without angina pectoris: I25.10

## 2015-11-16 HISTORY — DX: Thrombocytopenia, unspecified: D69.6

## 2015-11-16 HISTORY — DX: Other intervertebral disc degeneration, lumbar region: M51.36

## 2015-11-16 HISTORY — DX: Benign neoplasm of colon, unspecified: D12.6

## 2015-11-16 HISTORY — DX: Other intervertebral disc degeneration, lumbar region without mention of lumbar back pain or lower extremity pain: M51.369

## 2015-11-16 HISTORY — DX: Other disorders of meninges, not elsewhere classified: G96.198

## 2015-11-16 HISTORY — DX: Testicular hypofunction: E29.1

## 2015-11-16 HISTORY — DX: Edema, unspecified: R60.9

## 2015-11-16 HISTORY — DX: Other specific joint derangements of unspecified hip, not elsewhere classified: M24.859

## 2015-11-16 HISTORY — DX: Unspecified atherosclerosis: I70.90

## 2015-11-16 HISTORY — DX: Male erectile dysfunction, unspecified: N52.9

## 2015-12-29 ENCOUNTER — Encounter: Payer: Self-pay | Admitting: Family

## 2016-01-07 ENCOUNTER — Ambulatory Visit (HOSPITAL_COMMUNITY)
Admission: RE | Admit: 2016-01-07 | Discharge: 2016-01-07 | Disposition: A | Discharge status: Home / Self Care | Payer: Medicare Other | Source: Ambulatory Visit | Attending: Family | Admitting: Family

## 2016-01-07 ENCOUNTER — Encounter: Payer: Self-pay | Admitting: Family

## 2016-01-07 ENCOUNTER — Ambulatory Visit (INDEPENDENT_AMBULATORY_CARE_PROVIDER_SITE_OTHER): Payer: Medicare Other | Admitting: Family

## 2016-01-07 ENCOUNTER — Other Ambulatory Visit: Payer: Self-pay | Admitting: Family

## 2016-01-07 VITALS — BP 115/62 | HR 54 | Temp 97.0°F | Resp 14 | Ht 69.0 in | Wt 186.0 lb

## 2016-01-07 DIAGNOSIS — I6529 Occlusion and stenosis of unspecified carotid artery: Secondary | ICD-10-CM

## 2016-01-07 DIAGNOSIS — Z48812 Encounter for surgical aftercare following surgery on the circulatory system: Secondary | ICD-10-CM

## 2016-01-07 DIAGNOSIS — I739 Peripheral vascular disease, unspecified: Secondary | ICD-10-CM | POA: Insufficient documentation

## 2016-01-07 DIAGNOSIS — I2510 Atherosclerotic heart disease of native coronary artery without angina pectoris: Secondary | ICD-10-CM | POA: Diagnosis not present

## 2016-01-07 DIAGNOSIS — Z87891 Personal history of nicotine dependence: Secondary | ICD-10-CM

## 2016-01-07 DIAGNOSIS — I10 Essential (primary) hypertension: Secondary | ICD-10-CM | POA: Diagnosis not present

## 2016-01-07 DIAGNOSIS — E785 Hyperlipidemia, unspecified: Secondary | ICD-10-CM | POA: Insufficient documentation

## 2016-01-07 DIAGNOSIS — I6523 Occlusion and stenosis of bilateral carotid arteries: Secondary | ICD-10-CM

## 2016-01-07 NOTE — Progress Notes (Signed)
Chief Complaint: Extracranial Carotid Artery Stenosis   History of Present Illness  Louis Barton is a 75 y.o. male patient of Dr. Oneida Alar who is s/p right CEA on 04/25/2011 and left CEA on 04/29/2004. He returns today for follow up.  Patient reports a history of TIA in July, 2005, preoperative to the first CEA, as manifested by left amaurosis fugax, right arm weakness, right facial droop; denies expressive aphasia, denies right leg weakness; all symptoms resolved in about 2 minutes.   He has ongoing back problems since age 14, he was evaluated by a neurosurgeon, has had ESI's in the remote past. He has radiculopathy in right anterior thigh at times.  He denies claudication symptoms with walking.  Pt states he had a normal stress test in 2016. Was exercising 3-4x/week, he stopped as he is not getting enough sleep due to his wife's sleeping patterns.   Pt Diabetic: No Pt smoker: former smoker, quit in 1974  Pt meds include: Statin : Yes ASA: Yes Other anticoagulants/antiplatelets: Plavix   Past Medical History  Diagnosis Date  . PAD (peripheral artery disease) ([place])   . History of TIAs   . History of thrombocytopenia   . Carotid stenosis   . Hyperlipidemia   . Hypertension   . CAD (coronary artery disease)   . Stroke [facility])     Social History Social History  Substance Use Topics  . Smoking status: Former Smoker    Types: Cigarettes    Quit date: 09/26/1972  . Smokeless tobacco: Never Used  . Alcohol Use: 1.2 oz/week    2 Glasses of wine per week     Comment:  1 Drinks of Scotch per Day    Family History Family History  Problem Relation Age of Onset  . Cancer Mother 86    Breast  . Stroke Mother   . Diabetes Mother   . Hyperlipidemia Mother   . Other Mother     varicose veins  . Heart attack Mother   . Heart disease Mother     Left ankle swelling  . Hypertension Mother   . Deep vein thrombosis Mother   . Varicose Veins Mother   . Diabetes Father   .  Heart attack Father   . Cancer Brother     BCC-SCC-Merkle Cell  . Other Sister     Tumor in [place]  . Cancer Sister     Tumor   Lung  and  Brain    Surgical History Past Surgical History  Procedure Laterality Date  . Septoplasty, nasal w/ submucosal resection  1960  . Carotid endarterectomy  2005    Left carotid by Dr. Oneida Alar  . Carotid endarterectomy  04/25/11    Right Carotid by Dr. Oneida Alar    No Known Allergies  Current Outpatient Prescriptions  Medication Sig Dispense Refill  . aspirin 325 MG tablet Take 81 mg by mouth daily.     . clobetasol (TEMOVATE) 0.05 % external solution Apply 1 application topically as needed.    [name] CLONAZEPAM PO Take by mouth as needed.      . clopidogrel (PLAVIX) 75 MG tablet Take 75 mg by mouth daily.    . colesevelam (WELCHOL) 625 MG tablet Take 1,875 mg by mouth 2 (two) times daily with a meal.      . DIOVAN 160 MG tablet 160 mg daily. VALSARTAN  160 MG  ONE  Tablet daily    . DIOVAN HCT 160-25 MG per tablet Take  by mouth daily.    . hydrochlorothiazide (HYDRODIURIL) 25 MG tablet daily.    . metoprolol succinate (TOPROL-XL) 25 MG 24 hr tablet     . oxyCODONE-acetaminophen (PERCOCET) 5-325 MG per tablet Take 1 tablet by mouth. Take one tab every 4-6 hrs prn for pain     . rosuvastatin (CRESTOR) 40 MG tablet Take 1 tablet (40 mg total) by mouth daily. 30 tablet 6  . sulfamethoxazole-trimethoprim (BACTRIM DS) 800-160 MG per tablet     . valsartan-hydrochlorothiazide (DIOVAN-HCT) 320-25 MG per tablet Take 1 tablet by mouth daily. 30 tablet 6  . ZETIA 10 MG tablet TAKE 1 TABLET EVERY DAY 30 tablet 6   No current facility-administered medications for this visit.    Review of Systems : See HPI for pertinent positives and negatives.  Physical Examination  Filed Vitals:   01/07/16 1014  BP: 115/62  Pulse: 54  Temp: 97 F (36.1 C)  TempSrc: Oral  Resp: 14  Height: '5\' 9"'$  (1.753 m)  Weight: 186 lb (84.369 kg)  SpO2: 96%   Body mass  index is 27.45 kg/(m^2).  General: WDWN male in NAD GAIT: normal Eyes: PERRLA Pulmonary: Non-labored respirations, CTAB, no rales, rhonchi, or wheezing.  Cardiac: regular rhythm, no detected murmur.  VASCULAR EXAM Carotid Bruits Left Right   Negative Negative   Aorta is not palpable Radial pulses are 2+ palpable and equal.      LE Pulses LEFT RIGHT   FEMORAL 3+ palpable 3+ palpable   POPLITEAL  palpable  not palpable   POSTERIOR TIBIAL 2+ palpable 2+ palpable   DORSALIS PEDIS Not palpable Not palpable    Gastrointestinal: soft, nontender, BS WNL, no r/g, no palpable masses.  Musculoskeletal: No muscle atrophy/wasting. M/S 5/5 throughout, Extremities without ischemic changes. Trace pitting edema in right ankle, 1+ in left, has worn knee high compression hose in the past.  Neurologic: A&O X 3; Appropriate Affect,  Speech is normal CN 2-12 intact, Pain and light touch intact in extremities, Motor exam as listed above.               Non-Invasive Vascular Imaging CAROTID DUPLEX 01/07/2016   No significant stenosis of the bilateral ECA or CCA. Bilateral CEA sites with no ICA stenosis. No significant change compared to exam of 01/01/15.   Assessment: Louis Barton is a 75 y.o. male who is s/p right CEA on 04/25/2011 and left CEA on 04/29/2004.  He has not had any further stroke or TIA activity since 2005 before his first CEA.   Today's carotid duplex suggests no significant stenosis of the bilateral ECA or CCA. Bilateral CEA sites with no ICA stenosis. No significant change compared to exam of 01/01/15.    Plan: Follow-up in 2 years with Carotid Duplex scan. Dr. Oneida Alar agreed with 2 years follow up.  I discussed in depth with the patient the nature of atherosclerosis, and emphasized the  importance of maximal medical management including strict control of blood pressure, blood glucose, and lipid levels, obtaining regular exercise, and continued cessation of smoking.  The patient is aware that without maximal medical management the underlying atherosclerotic disease process will progress, limiting the benefit of any interventions. The patient was given information about stroke prevention and what symptoms should prompt the patient to seek immediate medical care. Thank you for allowing [place] to participate in this patient's care.  Clemon Chambers, RN, MSN, FNP-C [facility] Office: [phone]  Clinic Physician: Oneida Alar  01/07/2016 10:28  AM

## 2016-01-07 NOTE — Patient Instructions (Signed)
Stroke Prevention Some medical conditions and behaviors are associated with an increased chance of having a stroke. You may prevent a stroke by making healthy choices and managing medical conditions. HOW CAN I REDUCE MY RISK OF HAVING A STROKE?   Stay physically active. Get at least 30 minutes of activity on most or all days.  Do not smoke. It may also be helpful to avoid exposure to secondhand smoke.  Limit alcohol use. Moderate alcohol use is considered to be:  No more than 2 drinks per day for men.  No more than 1 drink per day for nonpregnant women.  Eat healthy foods. This involves:  Eating 5 or more servings of fruits and vegetables a day.  Making dietary changes that address high blood pressure (hypertension), high cholesterol, diabetes, or obesity.  Manage your cholesterol levels.  Making food choices that are high in fiber and low in saturated fat, trans fat, and cholesterol may control cholesterol levels.  Take any prescribed medicines to control cholesterol as directed by your health care provider.  Manage your diabetes.  Controlling your carbohydrate and sugar intake is recommended to manage diabetes.  Take any prescribed medicines to control diabetes as directed by your health care provider.  Control your hypertension.  Making food choices that are low in salt (sodium), saturated fat, trans fat, and cholesterol is recommended to manage hypertension.  Ask your health care provider if you need treatment to lower your blood pressure. Take any prescribed medicines to control hypertension as directed by your health care provider.  If you are 18-39 years of age, have your blood pressure checked every 3-5 years. If you are 40 years of age or older, have your blood pressure checked every year.  Maintain a healthy weight.  Reducing calorie intake and making food choices that are low in sodium, saturated fat, trans fat, and cholesterol are recommended to manage  weight.  Stop drug abuse.  Avoid taking birth control pills.  Talk to your health care provider about the risks of taking birth control pills if you are over 35 years old, smoke, get migraines, or have ever had a blood clot.  Get evaluated for sleep disorders (sleep apnea).  Talk to your health care provider about getting a sleep evaluation if you snore a lot or have excessive sleepiness.  Take medicines only as directed by your health care provider.  For some people, aspirin or blood thinners (anticoagulants) are helpful in reducing the risk of forming abnormal blood clots that can lead to stroke. If you have the irregular heart rhythm of atrial fibrillation, you should be on a blood thinner unless there is a good reason you cannot take them.  Understand all your medicine instructions.  Make sure that other conditions (such as anemia or atherosclerosis) are addressed. SEEK IMMEDIATE MEDICAL CARE IF:   You have sudden weakness or numbness of the face, arm, or leg, especially on one side of the body.  Your face or eyelid droops to one side.  You have sudden confusion.  You have trouble speaking (aphasia) or understanding.  You have sudden trouble seeing in one or both eyes.  You have sudden trouble walking.  You have dizziness.  You have a loss of balance or coordination.  You have a sudden, severe headache with no known cause.  You have new chest pain or an irregular heartbeat. Any of these symptoms may represent a serious problem that is an emergency. Do not wait to see if the symptoms will   go away. Get medical help at once. Call your local emergency services (911 in [place] Do not drive yourself to the hospital.   This information is not intended to replace advice given to you by your health care provider. Make sure you discuss any questions you have with your health care provider.   Document Released: 10/20/2004 Document Revised: 10/03/2014 Document Reviewed:  03/15/2013 Elsevier Interactive Patient Education 2016 Elsevier Inc.  

## 2016-02-01 ENCOUNTER — Other Ambulatory Visit: Payer: Self-pay | Admitting: *Deleted

## 2016-02-01 DIAGNOSIS — I6523 Occlusion and stenosis of bilateral carotid arteries: Secondary | ICD-10-CM

## 2016-04-12 ENCOUNTER — Telehealth: Payer: Self-pay

## 2016-04-12 NOTE — Telephone Encounter (Signed)
Louis Barton, Dr. Fletcher Anon is his cardiologist and seems to me would be the most appropriate provider to weigh in on this. If I recall correctly, there seems to be conflicting study results on the cardiovascular effects of testosterone supplementation. I defer to Dr. Oneida Alar re this if pt's PCP is seeking clearance from his vascular surgeon.  Thank you, [name]

## 2016-04-12 NOTE — Telephone Encounter (Signed)
rec'd phone call from pt.  Reported he has had weight loss, and loss of muscle mass, and there is a question about his bone density.  Stated his PCP is recommending Testosterone Injections, but requires clearance from his Vascular surgeon.  Advised pt. Will defer question to the nurse practitioner, as Dr. Oneida Alar is out of office this week.  Verb. Understanding.

## 2016-04-14 NOTE — Telephone Encounter (Signed)
Notified pt. Re: Nurse Practitioner's recommendation to contact his Cardiologist re: question of safety of receiving Testosterone.  Advised pt. That Dr. Oneida Alar will be consulted on this, also, upon his return to office 7/26.  Pt. Verb. Understanding.  Stated he will call his Cardiologist.

## 2016-04-22 ENCOUNTER — Telehealth: Payer: Self-pay

## 2016-04-22 NOTE — Telephone Encounter (Signed)
-----   Message from Louis Dutch, MD sent at 04/22/2016 12:59 PM EDT ----- Regarding: RE: question about receiving Testosterone injections I would advise against it. ----- Message ----- From: Louis George, RN Sent: 04/22/2016  10:48 AM To: Louis Dutch, MD Subject: question about receiving Testosterone inject#  This pt. Called last week and reported he has experienced weight loss, loss of muscle mass, and there is a question about bone density.  Stated his PCP is recommending Testosterone injections, but required clearance from his Vascular surgeon.  He was last seen by [name] 4/17 for carotid surveillance, so I asked her about this; she recommended that the pt. check with his Cardiologist, and that I defer this question to you for vascular approval.  Please advise.

## 2016-04-22 NOTE — Telephone Encounter (Signed)
Attempted to call pt. to inform of Dr. Oneida Alar recommendation.  Left voice message that Dr. Oneida Alar is advising against receiving Testosterone injections.  Advised to call office if further questions.

## 2016-08-08 DIAGNOSIS — H8110 Benign paroxysmal vertigo, unspecified ear: Secondary | ICD-10-CM | POA: Insufficient documentation

## 2016-08-08 HISTORY — DX: Benign paroxysmal vertigo, unspecified ear: H81.10

## 2016-09-28 DIAGNOSIS — Z22322 Carrier or suspected carrier of Methicillin resistant Staphylococcus aureus: Secondary | ICD-10-CM

## 2016-09-28 HISTORY — DX: Carrier or suspected carrier of methicillin resistant Staphylococcus aureus: Z22.322

## 2016-10-24 DIAGNOSIS — N281 Cyst of kidney, acquired: Secondary | ICD-10-CM | POA: Insufficient documentation

## 2016-10-24 DIAGNOSIS — M792 Neuralgia and neuritis, unspecified: Secondary | ICD-10-CM

## 2016-10-24 HISTORY — DX: Cyst of kidney, acquired: N28.1

## 2016-10-24 HISTORY — DX: Neuralgia and neuritis, unspecified: M79.2

## 2016-11-16 ENCOUNTER — Encounter: Payer: Self-pay | Admitting: Internal Medicine

## 2016-11-16 ENCOUNTER — Ambulatory Visit (INDEPENDENT_AMBULATORY_CARE_PROVIDER_SITE_OTHER): Payer: Medicare Other | Admitting: Internal Medicine

## 2016-11-16 VITALS — BP 118/70 | HR 71 | Ht 69.0 in | Wt 188.6 lb

## 2016-11-16 DIAGNOSIS — R911 Solitary pulmonary nodule: Secondary | ICD-10-CM | POA: Insufficient documentation

## 2016-11-16 DIAGNOSIS — K589 Irritable bowel syndrome without diarrhea: Secondary | ICD-10-CM

## 2016-11-16 HISTORY — DX: Solitary pulmonary nodule: R91.1

## 2016-11-16 NOTE — Patient Instructions (Signed)
Please see patient coordinator before you leave today  to schedule CT chest s contrast in 3 months  Classic subdiaphragmatic pain pattern suggests ibs:  Stereotypical, with a very limited distribution of pain locations, daytime, not exacerbated by ex or coughing, worse in sitting position, associated with generalized abd bloating, not present supine due to the dome effect of the diaphragm is  canceled in that position. Frequently these patients have had multiple negative GI workups and CT scans.  Treatment consists of avoiding foods that cause gas (especially [place] food /  beans and raw vegetables like spinach and salads and boiled eggs )  and citrucel 1 heaping tsp twice daily with a large glass of water.  Pain should improve w/in 2 weeks and if not then consider further GI work up.

## 2016-11-16 NOTE — Progress Notes (Signed)
Subjective:     Patient ID: Louis Barton, male   DOB: [birth year],     MRN: [id number]  HPI  1 yowm quit smoking 1974 due to cardiac concerns with CT Abd 10/20/16 c/w 7 mm nodule     11/16/2016 1st [facility] office visit/ Omaira Mellen   Chief Complaint  Patient presents with  . Pulmonary Consult    Referred by [facility] for eval of lung nodule. The pt denies any respiratory co's.    onset of LUQ pain sept 2017 worse in sitting positon after eating > this led to ct chest but the abd w/u has been neg/ pain is ongoing daily but resolves in supine position assoc with sensation of swelling/ distention in LUQ s pleuritic features    Not limited by breathing from desired activities    No obvious day to day or daytime variability or assoc excess/ purulent sputum or mucus plugs or hemoptysis or cp or chest tightness, subjective wheeze or overt sinus or hb symptoms. No unusual exp hx or h/o childhood pna/ asthma or knowledge of premature birth.  Sleeping ok without nocturnal  or early am exacerbation  of respiratory  c/o's or need for noct saba. Also denies any obvious fluctuation of symptoms with weather or environmental changes or other aggravating or alleviating factors except as outlined above   Current Medications, Allergies, Complete Past Medical History, Past Surgical History, Family History, and Social History were reviewed in [employer] record.  ROS  The following are not active complaints unless bolded sore throat, dysphagia, dental problems, itching, sneezing,  nasal congestion or excess/ purulent secretions, ear ache,   fever, chills, sweats, unintended wt loss, classically pleuritic or exertional cp,  orthopnea pnd or leg swelling, presyncope, palpitations, abdominal pain, anorexia, nausea, vomiting, diarrhea  or change in bowel or bladder habits, change in stools or urine, dysuria,hematuria,  rash, arthralgias, visual complaints, headache, numbness,  weakness or ataxia or problems with walking or coordination,  change in mood/affect or memory.         Review of Systems     Objective:   Physical Exam    very pleasant amb wm nad   Wt Readings from Last 3 Encounters:  11/16/16 188 lb 9.6 oz (85.5 kg)  01/07/16 186 lb (84.4 kg)  01/01/15 189 lb (85.7 kg)    Vital signs reviewed  - Note on arrival 02 sats  96% on RA       HEENT: nl dentition, turbinates bilaterally, and oropharynx. Nl external ear canals without cough reflex   NECK :  without JVD/Nodes/TM/ nl carotid upstrokes bilaterally   LUNGS: no acc muscle use,  Nl contour chest which is clear to A and P bilaterally without cough on insp or exp maneuvers   CV:  RRR  no s3 or murmur or increase in P2, and no edema   ABD:  soft and nontender with nl inspiratory excursion in the supine position. No bruits or organomegaly appreciated, bowel sounds nl  MS:  Nl gait/ ext warm without deformities, calf tenderness, cyanosis or clubbing No obvious joint restrictions   SKIN: warm and dry without lesions    NEURO:  alert, approp, nl sensorium with  no motor or cerebellar deficits apparent.     CT Abd 10/10/16 7 mm R perihilar nodule      Assessment:

## 2016-11-17 DIAGNOSIS — K589 Irritable bowel syndrome without diarrhea: Secondary | ICD-10-CM

## 2016-11-17 HISTORY — DX: Irritable bowel syndrome, unspecified: K58.9

## 2016-11-17 NOTE — Assessment & Plan Note (Signed)
CT Abd 10/20/16 c/w 7 mm nodule  - rec repeat @ 3 m  CT results reviewed with pt >>> Too small for PET or bx, not suspicious enough for excisional bx > really only option for now is follow the Fleischner society guidelines as rec by radiology.   Discussed in detail all the  indications, usual  risks and alternatives  relative to the benefits with patient who agrees to proceed with full CT chest in 3 m as he is low but not zero risk based on smoking hx and we haven't acutally viz the entire chest yet  Total time devoted to counseling  > 50 % of initial 45 min office visit:  review case with pt/ discussion of options/alternatives/ personally creating written customized instructions  in presence of pt  then going over those specific  Instructions directly with the pt including how to use all of the meds but in particular covering each new medication in detail and the difference between the maintenance= "automatic" meds and the prns using an action plan format for the latter (If this problem/symptom => do that organization reading Left to right).  Please see AVS from this visit for a full list of these instructions which I personally wrote for this pt and  are unique to this visit.

## 2016-11-17 NOTE — Assessment & Plan Note (Signed)
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

## 2017-02-13 ENCOUNTER — Ambulatory Visit (INDEPENDENT_AMBULATORY_CARE_PROVIDER_SITE_OTHER)
Admission: RE | Admit: 2017-02-13 | Discharge: 2017-02-13 | Disposition: A | Discharge status: Home / Self Care | Payer: Medicare Other | Source: Ambulatory Visit | Attending: Internal Medicine | Admitting: Internal Medicine

## 2017-02-13 ENCOUNTER — Telehealth: Payer: Self-pay | Admitting: Internal Medicine

## 2017-02-13 DIAGNOSIS — R911 Solitary pulmonary nodule: Secondary | ICD-10-CM | POA: Diagnosis not present

## 2017-02-13 NOTE — Telephone Encounter (Signed)
See result note.  

## 2017-02-13 NOTE — Telephone Encounter (Signed)
There is nothing documented from you in the result note Please advise Dr [name]. Thanks.

## 2017-02-13 NOTE — Telephone Encounter (Signed)
CT chest call report from [facility] Please advise Dr [name]. Thanks.   IMPRESSION: Aortic atherosclerosis.  Coronary artery calcifications are noted suggesting coronary artery disease.  Small sliding-type hiatal hernia.  Approximately 9 mm nodule seen in right lower lobe which may be slightly enlarged compared to prior exam. Consider one of the following in 3 months for both low-risk and high-risk individuals: (a) repeat chest CT, (b) follow-up PET-CT, or (c) tissue sampling. This recommendation follows the consensus statement: Guidelines for Management of Incidental Pulmonary Nodules Detected on CT Images: From the Fleischner Society 2017; Radiology 2017; 284:228-243. These results will be called to the ordering clinician or representative by the Radiologist Assistant, and communication documented in the PACS or zVision Dashboard.

## 2017-02-13 NOTE — Progress Notes (Signed)
LMTCB

## 2017-02-21 ENCOUNTER — Telehealth: Payer: Self-pay | Admitting: Internal Medicine

## 2017-02-21 DIAGNOSIS — R911 Solitary pulmonary nodule: Secondary | ICD-10-CM

## 2017-02-21 NOTE — Telephone Encounter (Signed)
Patient calling for results - he can be reached at [phone] -pr

## 2017-02-21 NOTE — Telephone Encounter (Signed)
Called and spoke with pt and he is aware of results of CT scan per MW.  I have placed the order for the PET scan to be scheduled and pt will need appt with MW after the PET scan. Pt voiced his understanding of these results.  Nothing further is needed.

## 2017-02-23 ENCOUNTER — Encounter: Payer: Self-pay | Admitting: Gastroenterology

## 2017-02-23 HISTORY — PX: COLONOSCOPY: SHX174

## 2017-03-16 ENCOUNTER — Telehealth: Payer: Self-pay | Admitting: Internal Medicine

## 2017-03-16 NOTE — Telephone Encounter (Signed)
Verified with Magda Paganini that she has the disk left by pt.  lmtcb X1 for pt to make aware that we have given disk to MW and will call back once we receive his recs regarding the images.    MW please advise on images.  Thanks!

## 2017-03-17 NOTE — Telephone Encounter (Signed)
Thanks but this one is already in the system and doesn't show any nodules in the area of concern so no change in my original recs

## 2017-03-17 NOTE — Telephone Encounter (Signed)
Spoke with pt. He is aware of MW's response. Nothing further was needed at this time.

## 2017-04-04 ENCOUNTER — Ambulatory Visit (HOSPITAL_COMMUNITY)
Admission: RE | Admit: 2017-04-04 | Discharge: 2017-04-04 | Disposition: A | Discharge status: Home / Self Care | Payer: Medicare Other | Source: Ambulatory Visit | Attending: Internal Medicine | Admitting: Internal Medicine

## 2017-04-04 DIAGNOSIS — R911 Solitary pulmonary nodule: Secondary | ICD-10-CM | POA: Diagnosis not present

## 2017-04-04 LAB — GLUCOSE, CAPILLARY: GLUCOSE-CAPILLARY: 96 mg/dL (ref 65–99)

## 2017-04-04 MED ORDER — FLUDEOXYGLUCOSE F - 18 (FDG) INJECTION
9.2200 | Freq: Once | INTRAVENOUS | Status: AC | PRN
  Administered 2017-04-04: 9.22 via INTRAVENOUS

## 2017-06-21 DIAGNOSIS — E538 Deficiency of other specified B group vitamins: Secondary | ICD-10-CM | POA: Insufficient documentation

## 2017-06-21 HISTORY — DX: Deficiency of other specified B group vitamins: E53.8

## 2017-08-31 ENCOUNTER — Other Ambulatory Visit: Payer: Self-pay | Admitting: Internal Medicine

## 2017-08-31 DIAGNOSIS — R911 Solitary pulmonary nodule: Secondary | ICD-10-CM

## 2017-09-26 HISTORY — PX: OTHER SURGICAL HISTORY: SHX169

## 2017-10-04 ENCOUNTER — Other Ambulatory Visit: Payer: Self-pay

## 2017-10-04 ENCOUNTER — Ambulatory Visit (INDEPENDENT_AMBULATORY_CARE_PROVIDER_SITE_OTHER): Payer: Medicare Other | Admitting: Cardiology

## 2017-10-04 ENCOUNTER — Encounter: Payer: Self-pay | Admitting: Cardiology

## 2017-10-04 VITALS — BP 136/82 | HR 58 | Ht 69.0 in | Wt 193.0 lb

## 2017-10-04 DIAGNOSIS — I10 Essential (primary) hypertension: Secondary | ICD-10-CM | POA: Diagnosis not present

## 2017-10-04 DIAGNOSIS — R001 Bradycardia, unspecified: Secondary | ICD-10-CM

## 2017-10-04 DIAGNOSIS — E782 Mixed hyperlipidemia: Secondary | ICD-10-CM

## 2017-10-04 DIAGNOSIS — I6523 Occlusion and stenosis of bilateral carotid arteries: Secondary | ICD-10-CM | POA: Diagnosis not present

## 2017-10-04 NOTE — Progress Notes (Signed)
Cardiology Office Note:    Date:  10/04/2017   ID:  Louis Barton, DOB 07/28/1941, MRN [id number]  PCP:  Raina Mina., MD  Cardiologist:  Jenne Campus, MD    Referring MD: Raina Mina., MD   No chief complaint on file. Doing well  History of Present Illness:    Louis Barton is a 77 y.o. 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  Comes today to office for follow-up overall he is doing well but complains of some weakness and fatigue.  He used to exercise on the regular basis but does not do it anymore that is because of complex situation that he had at home and his sick wife.  He is determined to go back to his exercises.  Already started doing it and feels better.  No dizziness no passing out he did have history of vertigo clearly vertigo when he turns his head very quickly room would start spinning around.  I do not think that was related to bradycardia.  Past Medical History:  Diagnosis Date  . CAD (coronary artery disease)   . Carotid stenosis   . History of thrombocytopenia   . History of TIAs   . Hyperlipidemia   . Hypertension   . PAD (peripheral artery disease) ([place])   . Stroke [facility])     Past Surgical History:  Procedure Laterality Date  . CAROTID ENDARTERECTOMY  2005   Left carotid by Dr. Oneida Alar  . CAROTID ENDARTERECTOMY  04/25/11   Right Carotid by Dr. Oneida Alar  . Septoplasty, nasal w/ submucosal resection  1960    Current Medications: Current Meds  Medication Sig  . aspirin 325 MG tablet Take 325 mg by mouth daily.  . clonazePAM (KLONOPIN) 0.5 MG tablet Take 0.5 mg by mouth 3 (three) times daily as needed for anxiety.  . clopidogrel (PLAVIX) 75 MG tablet Take 75 mg by mouth daily.  . colesevelam (WELCHOL) 625 MG tablet Take 1,875 mg by mouth 2 (two) times daily with a meal.    . hydrochlorothiazide (HYDRODIURIL) 25 MG tablet daily.  [name] losartan (COZAAR) 100 MG tablet Take 1 tablet by mouth  daily.  . rosuvastatin (CRESTOR) 40 MG tablet Take 1 tablet (40 mg total) by mouth daily.  . valACYclovir (VALTREX) 1000 MG tablet Take 1 tablet by mouth 2 (two) times daily as needed. Fever blisters  . [DISCONTINUED] DIOVAN 160 MG tablet 160 mg daily. VALSARTAN  160 MG  ONE  Tablet daily     Allergies:   Patient has no known allergies.   Social History   Socioeconomic History  . Marital status: Married    Spouse name: None  . Number of children: None  . Years of education: None  . Highest education level: None  Social Needs  . Financial resource strain: None  . Food insecurity - worry: None  . Food insecurity - inability: None  . Transportation needs - medical: None  . Transportation needs - non-medical: None  Occupational History  . None  Tobacco Use  . Smoking status: Former Smoker    Packs/day: 1.00    Years: 16.00    Pack years: 16.00    Types: Cigarettes    Last attempt to quit: 09/26/1972    Years since quitting: 45.0  . Smokeless tobacco: Never Used  Substance and Sexual Activity  . Alcohol use: Yes    Alcohol/week: 1.2 oz    Types: 2 Glasses of wine per  week    Comment:  1 Drinks of Scotch per Day  . Drug use: No  . Sexual activity: None  Other Topics Concern  . None  Social History Narrative  . None     Family History: The patient's family history includes Cancer in his brother and sister; Cancer (age of onset: 37) in his mother; Deep vein thrombosis in his mother; Diabetes in his father and mother; Heart attack in his father and mother; Heart disease in his mother; Hyperlipidemia in his mother; Hypertension in his mother; Other in his mother and sister; Stroke in his mother; Varicose Veins in his mother. ROS:   Please see the history of present illness.    All 14 point review of systems negative except as described per history of present illness  EKGs/Labs/Other Studies Reviewed:      Recent Labs: No results found for requested labs within last 8760  hours.  Recent Lipid Panel No results found for: CHOL, TRIG, HDL, CHOLHDL, VLDL, LDLCALC, LDLDIRECT  Physical Exam:    VS:  BP 136/82 (BP Location: Right Arm, Patient Position: Sitting, Cuff Size: Large)   Pulse (!) 58   Ht 5\' 9"  (1.753 m)   Wt 193 lb 0.6 oz (87.6 kg)   SpO2 95%   BMI 28.51 kg/m     Wt Readings from Last 3 Encounters:  10/04/17 193 lb 0.6 oz (87.6 kg)  11/16/16 188 lb 9.6 oz (85.5 kg)  01/07/16 186 lb (84.4 kg)     GEN:  Well nourished, well developed in no acute distress HEENT: Normal NECK: No JVD; No carotid bruits LYMPHATICS: No lymphadenopathy CARDIAC: RRR, no murmurs, no rubs, no gallops RESPIRATORY:  Clear to auscultation without rales, wheezing or rhonchi  ABDOMEN: Soft, non-tender, non-distended MUSCULOSKELETAL:  No edema; No deformity  SKIN: Warm and dry LOWER EXTREMITIES: no swelling NEUROLOGIC:  Alert and oriented x 3 PSYCHIATRIC:  Normal affect   ASSESSMENT:    1. Bilateral carotid artery stenosis   2. Essential hypertension   3. Bradycardia   4. Mixed hyperlipidemia    PLAN:    In order of problems listed above:  1. Bilateral carotid artery stenosis: That is being follow-up by vascular surgeon he does have appointment in the next few months to have repeat her carotid ultrasound.  In the meantime he is taking aspirin as well as Plavix. 2. Essential hypertension: Blood pressure appears to be well controlled we will continue present management. 3. Bradycardia: Denies having any dizziness or passing out there is no nightmares.  We talked at length about this I told him that he will required an hour Holter monitor probably will do it in the summer. 4. This lipidemia: We will check his fasting lipid profile today.  He is on high intensity statin already which I will continue.   Medication Adjustments/Labs and Tests Ordered: Current medicines are reviewed at length with the patient today.  Concerns regarding medicines are outlined above.  No  orders of the defined types were placed in this encounter.  Medication changes: No orders of the defined types were placed in this encounter.   Signed, Park Liter, MD, [facility] 10/04/2017 8:55 AM    [place]

## 2017-10-04 NOTE — Patient Instructions (Signed)
Medication Instructions:  Your physician recommends that you continue on your current medications as directed. Please refer to the Current Medication list given to you today.  Labwork: Your physician recommends that you have the following labs drawn: lipid panel  Testing/Procedures: None  Follow-Up: Your physician recommends that you schedule a follow-up appointment in: 6 months  Any Other Special Instructions Will Be Listed Below (If Applicable).     If you need a refill on your cardiac medications before your next appointment, please call your pharmacy.   [place], RN, BSN

## 2017-10-05 LAB — LIPID PANEL
Chol/HDL Ratio: 2.6 ratio (ref 0.0–5.0)
Cholesterol, Total: 183 mg/dL (ref 100–199)
HDL: 71 mg/dL (ref >39)
LDL Calculated: 94 mg/dL (ref 0–99)
Triglycerides: 90 mg/dL (ref 0–149)
VLDL Cholesterol Cal: 18 mg/dL (ref 5–40)

## 2017-10-09 ENCOUNTER — Ambulatory Visit (INDEPENDENT_AMBULATORY_CARE_PROVIDER_SITE_OTHER)
Admission: RE | Admit: 2017-10-09 | Discharge: 2017-10-09 | Disposition: A | Discharge status: Home / Self Care | Payer: Medicare Other | Source: Ambulatory Visit | Attending: Internal Medicine | Admitting: Internal Medicine

## 2017-10-09 ENCOUNTER — Inpatient Hospital Stay: Admission: RE | Admit: 2017-10-09 | Payer: Medicare Other | Source: Ambulatory Visit

## 2017-10-09 DIAGNOSIS — R911 Solitary pulmonary nodule: Secondary | ICD-10-CM | POA: Diagnosis not present

## 2017-10-10 ENCOUNTER — Encounter (INDEPENDENT_AMBULATORY_CARE_PROVIDER_SITE_OTHER): Payer: Self-pay

## 2017-10-10 ENCOUNTER — Other Ambulatory Visit: Payer: Self-pay | Admitting: Internal Medicine

## 2017-10-10 DIAGNOSIS — R911 Solitary pulmonary nodule: Secondary | ICD-10-CM

## 2017-10-10 NOTE — Progress Notes (Signed)
Spoke with pt and notified of results per Dr. [name]. Pt verbalized understanding and denied any questions. He agrees to referral and order was sent to [facility].

## 2017-10-12 ENCOUNTER — Other Ambulatory Visit: Payer: Medicare Other

## 2017-10-18 ENCOUNTER — Encounter: Payer: Medicare Other | Admitting: Thoracic Surgery (Cardiothoracic Vascular Surgery)

## 2017-10-20 ENCOUNTER — Institutional Professional Consult (permissible substitution) (INDEPENDENT_AMBULATORY_CARE_PROVIDER_SITE_OTHER): Payer: Medicare Other | Admitting: Thoracic Surgery (Cardiothoracic Vascular Surgery)

## 2017-10-20 ENCOUNTER — Other Ambulatory Visit: Payer: Self-pay | Admitting: *Deleted

## 2017-10-20 VITALS — BP 137/75 | HR 60 | Temp 97.3°F | Resp 20 | Ht 69.0 in | Wt 193.0 lb

## 2017-10-20 DIAGNOSIS — R911 Solitary pulmonary nodule: Secondary | ICD-10-CM

## 2017-10-20 DIAGNOSIS — I6523 Occlusion and stenosis of bilateral carotid arteries: Secondary | ICD-10-CM | POA: Diagnosis not present

## 2017-10-20 NOTE — H&P (View-Only) (Signed)
PCP is Raina Mina., MD Referring Provider is Tanda Rockers, MD  Chief Complaint  Patient presents with  . Lung Lesion    Surgical eval    HPI: Louis Barton is a 77 year old gentleman sent for consultation regarding a right lower lobe lung nodule.  Louis Barton is a 77 year old man with a history of remote tobacco use, quit at age 12.  His past medical history is significant for coronary artery disease, carotid artery disease, TIA, stroke, carotid endarterectomy, peripheral arterial disease, hyperlipidemia, hypertension, and thrombocytopenia.  Last January he was having pain in the left side of his chest.  A CT of the chest showed no cause for the pain but did show a small right lower lobe lung nodule.  He had a follow-up exam in May with a PET/CT.  The SUV was 1.7 and the nodule was not appreciably changed in size.  He recently had a another CT for follow-up of that and the nodule had increased in size from 7 x 10 mm to 10 x 13 mm.  There is no mediastinal or hilar adenopathy.  He has recently had a cough.  It has been productive of yellowish mucus.  He has not had any fevers or chills.  He denies any wheezing or shortness of breath.  He denies any chest pain, pressure, or tightness.  He has a good appetite and denies any weight loss.  Zubrod Score: At the time of surgery this patient's most appropriate activity status/level should be described as: [x]     0    Normal activity, no symptoms []     1    Restricted in physical strenuous activity but ambulatory, able to do out light work []     2    Ambulatory and capable of self care, unable to do work activities, up and about >50 % of waking hours                              []     3    Only limited self care, in bed greater than 50% of waking hours []     4    Completely disabled, no self care, confined to bed or chair []     5    Moribund  Past Medical History:  Diagnosis Date  . CAD (coronary artery disease)   . Carotid stenosis   . History of  thrombocytopenia   . History of TIAs   . Hyperlipidemia   . Hypertension   . PAD (peripheral artery disease) ([place])   . Stroke [facility])     Past Surgical History:  Procedure Laterality Date  . CAROTID ENDARTERECTOMY  2005   Left carotid by Dr. Oneida Alar  . CAROTID ENDARTERECTOMY  04/25/11   Right Carotid by Dr. Oneida Alar  . Septoplasty, nasal w/ submucosal resection  1960    Family History  Problem Relation Age of Onset  . Cancer Mother 62       Breast  . Stroke Mother   . Diabetes Mother   . Hyperlipidemia Mother   . Other Mother        varicose veins  . Heart attack Mother   . Heart disease Mother        Left ankle swelling  . Hypertension Mother   . Deep vein thrombosis Mother   . Varicose Veins Mother   . Diabetes Father   . Heart attack Father   . Other Sister  Tumor in Lung and Brain  . Cancer Sister        Tumor   Lung  and  Brain  . Cancer Brother        BCC-SCC-Merkle Cell    Social History Social History   Tobacco Use  . Smoking status: Former Smoker    Packs/day: 1.00    Years: 16.00    Pack years: 16.00    Types: Cigarettes    Last attempt to quit: 09/26/1972    Years since quitting: 45.0  . Smokeless tobacco: Never Used  Substance Use Topics  . Alcohol use: Yes    Alcohol/week: 1.2 oz    Types: 2 Glasses of wine per week    Comment:  1 Drinks of Scotch per Day  . Drug use: No    Current Outpatient Medications  Medication Sig Dispense Refill  . aspirin EC 81 MG tablet Take 81 mg by mouth daily.    [name] azithromycin (ZITHROMAX) 250 MG tablet     . clonazePAM (KLONOPIN) 0.5 MG tablet Take 0.5 mg by mouth 3 (three) times daily as needed for anxiety.    . clopidogrel (PLAVIX) 75 MG tablet Take 75 mg by mouth daily.    . colesevelam (WELCHOL) 625 MG tablet Take 1,875 mg by mouth 2 (two) times daily with a meal.      . hydrochlorothiazide (HYDRODIURIL) 25 MG tablet daily.    [name] losartan (COZAAR) 100 MG tablet Take 1 tablet by mouth daily.    .  rosuvastatin (CRESTOR) 40 MG tablet Take 1 tablet (40 mg total) by mouth daily. 30 tablet 6  . valACYclovir (VALTREX) 1000 MG tablet Take 1 tablet by mouth 2 (two) times daily as needed. Fever blisters     No current facility-administered medications for this visit.     No Known Allergies  Review of Systems  Constitutional: Negative for activity change, appetite change, chills, fever and unexpected weight change.  HENT: Negative for trouble swallowing and voice change.   Eyes: Negative for visual disturbance.  Respiratory: Positive for cough. Negative for shortness of breath and wheezing.   Cardiovascular: Negative for chest pain and leg swelling.  Gastrointestinal: Negative for abdominal pain and blood in stool.  Genitourinary: Negative for difficulty urinating and dysuria.  Musculoskeletal: Negative for arthralgias.       Leg cramps  Neurological: Negative for syncope, weakness and headaches.  Hematological: Negative for adenopathy. Does not bruise/bleed easily.  All other systems reviewed and are negative.   BP 137/75   Pulse 60   Temp (!) 97.3 F (36.3 C) (Oral)   Resp 20   Ht 5\' 9"  (1.753 m)   Wt 193 lb (87.5 kg)   SpO2 95% Comment: RA  BMI 28.50 kg/m  Physical Exam  Constitutional: He appears well-developed and well-nourished. No distress.  HENT:  Head: Normocephalic and atraumatic.  Mouth/Throat: No oropharyngeal exudate.  Eyes: Conjunctivae and EOM are normal. No scleral icterus.  Neck: Neck supple. No thyromegaly present.  Cardiovascular: Regular rhythm and normal heart sounds.  No murmur heard. bradycardic  Pulmonary/Chest: Effort normal and breath sounds normal. No respiratory distress. He has no wheezes. He has no rales.  Abdominal: Soft. He exhibits no distension. There is no tenderness.  Musculoskeletal: He exhibits no edema or deformity.  Lymphadenopathy:    He has no cervical adenopathy.  Neurological: He is alert. No cranial nerve deficit. He exhibits  normal muscle tone.  Skin: Skin is warm and dry.  Psychiatric: He  has a normal mood and affect.  Vitals reviewed.    Diagnostic Tests: CT CHEST WITHOUT CONTRAST  TECHNIQUE: Multidetector CT imaging of the chest was performed following the standard protocol without IV contrast.  COMPARISON:  CT 04/04/2017  FINDINGS: Cardiovascular: Coronary artery calcification and aortic atherosclerotic calcification.  Mediastinum/Nodes: No axillary supraclavicular adenopathy. No mediastinal hilar adenopathy. No pericardial fluid. Esophagus normal.  Lungs/Pleura: 13 mm nodule in the RIGHT lower lobe just above the diaphragm (image 104, series 3) compares to 10 mm on comparison exam 02/13/2017 and 7 mm on 10/10/2016. Coronal imaging lesion measures 7 mm paired to 7 mm.  No additional suspicious pulmonary nodules. Curvilinear pleural-parenchymal thickening in the lower RIGHT lower lobe is not changed.  Upper Abdomen: Limited view of the liver, kidneys, pancreas are unremarkable. Normal adrenal glands.  Musculoskeletal: No aggressive osseous lesion.  IMPRESSION: Continued potential enlargement of the LEFT lower lobe pulmonary nodule. This nodule was not hypermetabolic on comparison PET-CT scan however there are some limitations of imaging small nodules in the lung bases as mentioned. As this lesion would be difficult to access percutaneously due to diaphragmatic motion, recommend continued CT follow-up and if lesion continues to increase in size consider bronchoscopy with tissue sampling.  Aortic Atherosclerosis (ICD10-I70.0).   Electronically Signed   By: Suzy Bouchard M.D.   On: 10/09/2017 13:03 NUCLEAR MEDICINE PET SKULL BASE TO THIGH  TECHNIQUE: 9.2 mCi F-18 FDG was injected intravenously. Full-ring PET imaging was performed from the skull base to thigh after the radiotracer. CT data was obtained and used for attenuation correction and  anatomic localization.  FASTING BLOOD GLUCOSE:  Value: 96 mg/dl  COMPARISON:  Multiple exams, including chest CT 02/13/2017  FINDINGS: NECK  No hypermetabolic lymph nodes in the neck.  CHEST  9 by 10 mm right lower lobe pulmonary nodule was blurred by motion artifact for example on images 50-51 of series 8. Maximum SUV in 1.7 which is not different from surrounding lung. This lesion is close to the diaphragm which can introduce air due to motion.  There is also scarring or atelectasis medially in the right middle lobe. No other pulmonary nodules observed.  Mild cardiomegaly noted. Coronary, aortic arch, and branch vessel atherosclerotic vascular disease. Small type 1 hiatal hernia.  ABDOMEN/PELVIS  No abnormal hypermetabolic activity within the liver, pancreas, adrenal glands, or spleen. No hypermetabolic lymph nodes in the abdomen or pelvis.  Aortoiliac atherosclerotic vascular disease. Multiple hypodense exophytic renal lesions are most compatible with cysts and appear photopenic. 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  SKELETON  No focal hypermetabolic activity to suggest skeletal metastasis.  Bridging spurring of the right sacroiliac joint.  IMPRESSION: 1. These slowly enlarging 9 mm right lower lobe pulmonary nodule along the right hemidiaphragm does not appear hypermetabolic today. Location adjacent to the hemidiaphragm can cause false negatives due to motion artifact related to the diaphragm, and on the basis of the potential slow growth over the last 6 months, this nodule should be surveilled. I would suggest a follow up noncontrast chest CT in 3-6 months time. 2. Other imaging findings of potential clinical significance: Mild cardiomegaly. Aortic Atherosclerosis (ICD10-I70.0). Coronary atherosclerosis. Small type 1 hiatal hernia. Renal cysts. 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   Electronically Signed   By: Van Clines M.D.   On: 04/04/2017 10:56  I personally reviewed the  CT and PET/CT images and concur with the findings noted above.  There is the obvious typographic error in the impression of the CT where it says left whereas the nodule is on the RIGHT  Impression: Louis Barton is a 77 year old gentleman who is a non-smoker.  He does have significant atherosclerotic cardiovascular disease with previous bilateral carotid endarterectomies, coronary disease, and peripheral arterial disease.  He was found to have a 7 x 10 mm right lower lobe nodule on a CT done about a year ago.  Follow-up in May showed no significant change.  The PET images showed the metabolic activity was about at background.  Now a follow-up CT shows an increase in size to 10 x 13 mm.  This nodule is worrisome for a low-grade adenocarcinoma.  Infectious and inflammatory nodules are also within the differential.  This would be a difficult nodule to biopsy given its location.  I would not be comfortable with a negative CT-guided or bronchoscopic biopsy due to the significant possibility of sampling error.  I think the best option is to proceed with wedge resection for definitive diagnosis.  I discussed these issues with Louis Barton.  I recommended to Louis Barton that we proceed with right VATS for wedge resection and possible segmentectomy if the nodule turned out to be cancerous.  Given its relatively small size, slow growth, and peripheral location, I think a segmentectomy would be sufficient.  I described the proposed operation to them in detail.  He understands the need for general anesthesia, the incisions to be used, the use of a drainage tube postoperatively, the expected hospital stay, and the overall recovery.  I informed him of the indications, risks, benefits, and alternatives.  He understands the risks include, but not limited to death, MI,  DVT, PE, stroke, bleeding, possible need for transfusion, infection, prolonged air leak, cardiac arrhythmias, as well as the possibility of other unforeseeable complications.  He accepts the risks and wishes to proceed.  He is on Plavix. He will need to be off that for 5 days prior to surgery  Atherosclerotic cardiovascular disease-known CAD.  Does have evidence of CAD on his CT scan.  He is not having any anginal type symptoms.  He does have some bradycardia.  I will do not think there is any indication for stress test prior to surgery but will ask [name] his opinion.  Plan:  Right VATS, wedge resection, possible segmentectomy on Thursday, 11/09/2016  Stop Plavix after dose on 11/03/2016  [name], MD [facility] [phone]

## 2017-10-20 NOTE — Progress Notes (Signed)
PCP is Raina Mina., MD Referring Provider is Tanda Rockers, MD  Chief Complaint  Patient presents with  . Lung Lesion    Surgical eval    HPI: [name] is a 77 year old gentleman sent for consultation regarding a right lower lobe lung nodule.  Louis Barton is a 77 year old man with a history of remote tobacco use, quit at age 69.  His past medical history is significant for coronary artery disease, carotid artery disease, TIA, stroke, carotid endarterectomy, peripheral arterial disease, hyperlipidemia, hypertension, and thrombocytopenia.  Last January he was having pain in the left side of his chest.  A CT of the chest showed no cause for the pain but did show a small right lower lobe lung nodule.  He had a follow-up exam in May with a PET/CT.  The SUV was 1.7 and the nodule was not appreciably changed in size.  He recently had a another CT for follow-up of that and the nodule had increased in size from 7 x 10 mm to 10 x 13 mm.  There is no mediastinal or hilar adenopathy.  He has recently had a cough.  It has been productive of yellowish mucus.  He has not had any fevers or chills.  He denies any wheezing or shortness of breath.  He denies any chest pain, pressure, or tightness.  He has a good appetite and denies any weight loss.  Zubrod Score: At the time of surgery this patient's most appropriate activity status/level should be described as: [x]     0    Normal activity, no symptoms []     1    Restricted in physical strenuous activity but ambulatory, able to do out light work []     2    Ambulatory and capable of self care, unable to do work activities, up and about >50 % of waking hours                              []     3    Only limited self care, in bed greater than 50% of waking hours []     4    Completely disabled, no self care, confined to bed or chair []     5    Moribund  Past Medical History:  Diagnosis Date  . CAD (coronary artery disease)   . Carotid stenosis   . History of  thrombocytopenia   . History of TIAs   . Hyperlipidemia   . Hypertension   . PAD (peripheral artery disease) ([place])   . Stroke [facility])     Past Surgical History:  Procedure Laterality Date  . CAROTID ENDARTERECTOMY  2005   Left carotid by Dr. Oneida Alar  . CAROTID ENDARTERECTOMY  04/25/11   Right Carotid by Dr. Oneida Alar  . Septoplasty, nasal w/ submucosal resection  1960    Family History  Problem Relation Age of Onset  . Cancer Mother 66       Breast  . Stroke Mother   . Diabetes Mother   . Hyperlipidemia Mother   . Other Mother        varicose veins  . Heart attack Mother   . Heart disease Mother        Left ankle swelling  . Hypertension Mother   . Deep vein thrombosis Mother   . Varicose Veins Mother   . Diabetes Father   . Heart attack Father   . Other Sister  Tumor in Lung and Brain  . Cancer Sister        Tumor   Lung  and  Brain  . Cancer Brother        BCC-SCC-Merkle Cell    Social History Social History   Tobacco Use  . Smoking status: Former Smoker    Packs/day: 1.00    Years: 16.00    Pack years: 16.00    Types: Cigarettes    Last attempt to quit: 09/26/1972    Years since quitting: 45.0  . Smokeless tobacco: Never Used  Substance Use Topics  . Alcohol use: Yes    Alcohol/week: 1.2 oz    Types: 2 Glasses of wine per week    Comment:  1 Drinks of Scotch per Day  . Drug use: No    Current Outpatient Medications  Medication Sig Dispense Refill  . aspirin EC 81 MG tablet Take 81 mg by mouth daily.    [name] azithromycin (ZITHROMAX) 250 MG tablet     . clonazePAM (KLONOPIN) 0.5 MG tablet Take 0.5 mg by mouth 3 (three) times daily as needed for anxiety.    . clopidogrel (PLAVIX) 75 MG tablet Take 75 mg by mouth daily.    . colesevelam (WELCHOL) 625 MG tablet Take 1,875 mg by mouth 2 (two) times daily with a meal.      . hydrochlorothiazide (HYDRODIURIL) 25 MG tablet daily.    [name] losartan (COZAAR) 100 MG tablet Take 1 tablet by mouth daily.    .  rosuvastatin (CRESTOR) 40 MG tablet Take 1 tablet (40 mg total) by mouth daily. 30 tablet 6  . valACYclovir (VALTREX) 1000 MG tablet Take 1 tablet by mouth 2 (two) times daily as needed. Fever blisters     No current facility-administered medications for this visit.     No Known Allergies  Review of Systems  Constitutional: Negative for activity change, appetite change, chills, fever and unexpected weight change.  HENT: Negative for trouble swallowing and voice change.   Eyes: Negative for visual disturbance.  Respiratory: Positive for cough. Negative for shortness of breath and wheezing.   Cardiovascular: Negative for chest pain and leg swelling.  Gastrointestinal: Negative for abdominal pain and blood in stool.  Genitourinary: Negative for difficulty urinating and dysuria.  Musculoskeletal: Negative for arthralgias.       Leg cramps  Neurological: Negative for syncope, weakness and headaches.  Hematological: Negative for adenopathy. Does not bruise/bleed easily.  All other systems reviewed and are negative.   BP 137/75   Pulse 60   Temp (!) 97.3 F (36.3 C) (Oral)   Resp 20   Ht 5\' 9"  (1.753 m)   Wt 193 lb (87.5 kg)   SpO2 95% Comment: RA  BMI 28.50 kg/m  Physical Exam  Constitutional: He appears well-developed and well-nourished. No distress.  HENT:  Head: Normocephalic and atraumatic.  Mouth/Throat: No oropharyngeal exudate.  Eyes: Conjunctivae and EOM are normal. No scleral icterus.  Neck: Neck supple. No thyromegaly present.  Cardiovascular: Regular rhythm and normal heart sounds.  No murmur heard. bradycardic  Pulmonary/Chest: Effort normal and breath sounds normal. No respiratory distress. He has no wheezes. He has no rales.  Abdominal: Soft. He exhibits no distension. There is no tenderness.  Musculoskeletal: He exhibits no edema or deformity.  Lymphadenopathy:    He has no cervical adenopathy.  Neurological: He is alert. No cranial nerve deficit. He exhibits  normal muscle tone.  Skin: Skin is warm and dry.  Psychiatric: He  has a normal mood and affect.  Vitals reviewed.    Diagnostic Tests: CT CHEST WITHOUT CONTRAST  TECHNIQUE: Multidetector CT imaging of the chest was performed following the standard protocol without IV contrast.  COMPARISON:  CT 04/04/2017  FINDINGS: Cardiovascular: Coronary artery calcification and aortic atherosclerotic calcification.  Mediastinum/Nodes: No axillary supraclavicular adenopathy. No mediastinal hilar adenopathy. No pericardial fluid. Esophagus normal.  Lungs/Pleura: 13 mm nodule in the RIGHT lower lobe just above the diaphragm (image 104, series 3) compares to 10 mm on comparison exam 02/13/2017 and 7 mm on 10/10/2016. Coronal imaging lesion measures 7 mm paired to 7 mm.  No additional suspicious pulmonary nodules. Curvilinear pleural-parenchymal thickening in the lower RIGHT lower lobe is not changed.  Upper Abdomen: Limited view of the liver, kidneys, pancreas are unremarkable. Normal adrenal glands.  Musculoskeletal: No aggressive osseous lesion.  IMPRESSION: Continued potential enlargement of the LEFT lower lobe pulmonary nodule. This nodule was not hypermetabolic on comparison PET-CT scan however there are some limitations of imaging small nodules in the lung bases as mentioned. As this lesion would be difficult to access percutaneously due to diaphragmatic motion, recommend continued CT follow-up and if lesion continues to increase in size consider bronchoscopy with tissue sampling.  Aortic Atherosclerosis (ICD10-I70.0).   Electronically Signed   By: Suzy Bouchard M.D.   On: 10/09/2017 13:03 NUCLEAR MEDICINE PET SKULL BASE TO THIGH  TECHNIQUE: 9.2 mCi F-18 FDG was injected intravenously. Full-ring PET imaging was performed from the skull base to thigh after the radiotracer. CT data was obtained and used for attenuation correction and  anatomic localization.  FASTING BLOOD GLUCOSE:  Value: 96 mg/dl  COMPARISON:  Multiple exams, including chest CT 02/13/2017  FINDINGS: NECK  No hypermetabolic lymph nodes in the neck.  CHEST  9 by 10 mm right lower lobe pulmonary nodule was blurred by motion artifact for example on images 50-51 of series 8. Maximum SUV in 1.7 which is not different from surrounding lung. This lesion is close to the diaphragm which can introduce air due to motion.  There is also scarring or atelectasis medially in the right middle lobe. No other pulmonary nodules observed.  Mild cardiomegaly noted. Coronary, aortic arch, and branch vessel atherosclerotic vascular disease. Small type 1 hiatal hernia.  ABDOMEN/PELVIS  No abnormal hypermetabolic activity within the liver, pancreas, adrenal glands, or spleen. No hypermetabolic lymph nodes in the abdomen or pelvis.  Aortoiliac atherosclerotic vascular disease. Multiple hypodense exophytic renal lesions are most compatible with cysts and appear photopenic. 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  SKELETON  No focal hypermetabolic activity to suggest skeletal metastasis.  Bridging spurring of the right sacroiliac joint.  IMPRESSION: 1. These slowly enlarging 9 mm right lower lobe pulmonary nodule along the right hemidiaphragm does not appear hypermetabolic today. Location adjacent to the hemidiaphragm can cause false negatives due to motion artifact related to the diaphragm, and on the basis of the potential slow growth over the last 6 months, this nodule should be surveilled. I would suggest a follow up noncontrast chest CT in 3-6 months time. 2. Other imaging findings of potential clinical significance: Mild cardiomegaly. Aortic Atherosclerosis (ICD10-I70.0). Coronary atherosclerosis. Small type 1 hiatal hernia. Renal cysts. 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   Electronically Signed   By: Van Clines M.D.   On: 04/04/2017 10:56  I personally reviewed the  CT and PET/CT images and concur with the findings noted above.  There is the obvious typographic error in the impression of the CT where it says left whereas the nodule is on the RIGHT  Impression: Louis Barton is a 77 year old gentleman who is a non-smoker.  He does have significant atherosclerotic cardiovascular disease with previous bilateral carotid endarterectomies, coronary disease, and peripheral arterial disease.  He was found to have a 7 x 10 mm right lower lobe nodule on a CT done about a year ago.  Follow-up in May showed no significant change.  The PET images showed the metabolic activity was about at background.  Now a follow-up CT shows an increase in size to 10 x 13 mm.  This nodule is worrisome for a low-grade adenocarcinoma.  Infectious and inflammatory nodules are also within the differential.  This would be a difficult nodule to biopsy given its location.  I would not be comfortable with a negative CT-guided or bronchoscopic biopsy due to the significant possibility of sampling error.  I think the best option is to proceed with wedge resection for definitive diagnosis.  I discussed these issues with Mr. Mrs. Odland.  I recommended to Mr. Sakuma that we proceed with right VATS for wedge resection and possible segmentectomy if the nodule turned out to be cancerous.  Given its relatively small size, slow growth, and peripheral location, I think a segmentectomy would be sufficient.  I described the proposed operation to them in detail.  He understands the need for general anesthesia, the incisions to be used, the use of a drainage tube postoperatively, the expected hospital stay, and the overall recovery.  I informed him of the indications, risks, benefits, and alternatives.  He understands the risks include, but not limited to death, MI,  DVT, PE, stroke, bleeding, possible need for transfusion, infection, prolonged air leak, cardiac arrhythmias, as well as the possibility of other unforeseeable complications.  He accepts the risks and wishes to proceed.  He is on Plavix. He will need to be off that for 5 days prior to surgery  Atherosclerotic cardiovascular disease-known CAD.  Does have evidence of CAD on his CT scan.  He is not having any anginal type symptoms.  He does have some bradycardia.  I will do not think there is any indication for stress test prior to surgery but will ask [name] his opinion.  Plan:  Right VATS, wedge resection, possible segmentectomy on Thursday, 11/09/2016  Stop Plavix after dose on 11/03/2016  [name], MD [facility] [phone]

## 2017-10-23 ENCOUNTER — Other Ambulatory Visit: Payer: Self-pay | Admitting: *Deleted

## 2017-10-23 ENCOUNTER — Telehealth: Payer: Self-pay | Admitting: Internal Medicine

## 2017-10-23 DIAGNOSIS — R911 Solitary pulmonary nodule: Secondary | ICD-10-CM

## 2017-10-23 NOTE — Telephone Encounter (Signed)
Spoke with pt, he is having a nodule removed off his lower right lung and wants to know if inhalation treatment of albuterol affect the lung and can he continue to use it until 2/12, two days before surgery. MW please advise.   Patient Instructions by Tanda Rockers, MD at 11/16/2016 3:30 PM   Author: Tanda Rockers, MD Author Type: Physician Filed: 11/16/2016 3:49 PM  Note Status: Signed Cosign: Cosign Not Required Encounter Date: 11/16/2016  Editor: Tanda Rockers, MD (Physician)    Please see patient coordinator before you leave today  to schedule CT chest s contrast in 3 months  Classic subdiaphragmatic pain pattern suggests ibs:  Stereotypical, with a very limited distribution of pain locations, daytime, not exacerbated by ex or coughing, worse in sitting position, associated with generalized abd bloating, not present supine due to the dome effect of the diaphragm is  canceled in that position. Frequently these patients have had multiple negative GI workups and CT scans.  Treatment consists of avoiding foods that cause gas (especially [place] food /  beans and raw vegetables like spinach and salads and boiled eggs )  and citrucel 1 heaping tsp twice daily with a large glass of water.  Pain should improve w/in 2 weeks and if not then consider further GI work up.

## 2017-10-23 NOTE — Telephone Encounter (Signed)
No reason can't take albuterol right up to the surgery  - happy to see in interim for the cough as very likely not related to nodule at all

## 2017-10-23 NOTE — Telephone Encounter (Signed)
Spoke with the pt and notified of recs per MW  He verbalized understanding  Nothing further needed  

## 2017-11-06 NOTE — Pre-Procedure Instructions (Signed)
Louis Barton  11/06/2017      [facility] - [place], [place] [address] Phone: [phone] Fax: [place], [place] [address] Phone: [phone] Fax: [phone]    Your procedure is scheduled on Feb 14.  Report to [facility] Admitting at 600 A.M.  Call this number if you have problems the morning of surgery:  [phone]   Remember:  Do not eat food or drink liquids after midnight.  Take these medicines the morning of surgery with A SIP OF WATER  Tylenol if needed and Valtrex if needed  Stop taking aspirin as directed by your Dr.  Stop taking plavix 5 days prior to surgery.  Stop taking BC's, Goody's, Herbal medications, Fish Oil, Aleve, Ibuprofen, Advil, Motrin   Do not wear jewelry, make-up or nail polish.  Do not wear lotions, powders, or perfumes, or deodorant.  Do not shave 48 hours prior to surgery.  Men may shave face and neck.  Do not bring valuables to the hospital.  [facility] is not responsible for any belongings or valuables.  Contacts, dentures or bridgework may not be worn into surgery.  Leave your suitcase in the car.  After surgery it may be brought to your room.  For patients admitted to the hospital, discharge time will be determined by your treatment team.  Patients discharged the day of surgery will not be allowed to drive home.    Special instructions:  [place] - Preparing for Surgery  Before surgery, you can play an important role.  Because skin is not sterile, your skin needs to be as free of germs as possible.  You can reduce the number of germs on you skin by washing with CHG (chlorahexidine gluconate) soap before surgery.  CHG is an antiseptic cleaner which kills germs and bonds with the skin to continue killing germs even after washing.  Please DO NOT use if you have  an allergy to CHG or antibacterial soaps.  If your skin becomes reddened/irritated stop using the CHG and inform your nurse when you arrive at Short Stay.  Do not shave (including legs and underarms) for at least 48 hours prior to the first CHG shower.  You may shave your face.  Please follow these instructions carefully:   1.  Shower with CHG Soap the night before surgery and the   morning of Surgery.  2.  If you choose to wash your hair, wash your hair first as usual with your  normal shampoo.  3.  After you shampoo, rinse your hair and body thoroughly to remove the  Shampoo.  4.  Use CHG as you would any other liquid soap.  You can apply chg directly   to the skin and wash gently with scrungie or a clean washcloth.  5.  Apply the CHG Soap to your body ONLY FROM THE NECK DOWN.  Do not use on open wounds or open sores.  Avoid contact with your eyes, ears, mouth and genitals (private parts).  Wash genitals (private parts) with your normal soap.  6.  Wash thoroughly, paying special attention to the area where your surgery  will be performed.  7.  Thoroughly rinse your body with warm water from the neck down.  8.  DO NOT shower/wash with your normal soap after using and rinsing off  the  CHG Soap.  9.  Pat yourself dry with a clean towel.            10.  Wear clean pajamas.            11.  Place clean sheets on your bed the night of your first shower and do not   sleep with pets.  Day of Surgery  Do not apply any lotions/deoderants the morning of surgery.  Please wear clean clothes to the hospital/surgery center.     Please read over the following fact sheets that you were given. Pain Booklet, Coughing and Deep Breathing, MRSA Information and Surgical Site Infection Prevention

## 2017-11-07 ENCOUNTER — Ambulatory Visit (HOSPITAL_COMMUNITY)
Admission: RE | Admit: 2017-11-07 | Discharge: 2017-11-07 | Disposition: A | Discharge status: Home / Self Care | Payer: Medicare Other | Source: Ambulatory Visit | Attending: Thoracic Surgery (Cardiothoracic Vascular Surgery) | Admitting: Thoracic Surgery (Cardiothoracic Vascular Surgery)

## 2017-11-07 ENCOUNTER — Encounter (HOSPITAL_COMMUNITY): Payer: Self-pay

## 2017-11-07 ENCOUNTER — Other Ambulatory Visit: Payer: Self-pay

## 2017-11-07 ENCOUNTER — Encounter (HOSPITAL_COMMUNITY)
Admission: RE | Admit: 2017-11-07 | Discharge: 2017-11-07 | Disposition: A | Discharge status: Home / Self Care | Payer: Medicare Other | Source: Ambulatory Visit | Attending: Thoracic Surgery (Cardiothoracic Vascular Surgery) | Admitting: Thoracic Surgery (Cardiothoracic Vascular Surgery)

## 2017-11-07 DIAGNOSIS — Q6102 Congenital multiple renal cysts: Secondary | ICD-10-CM | POA: Insufficient documentation

## 2017-11-07 DIAGNOSIS — K449 Diaphragmatic hernia without obstruction or gangrene: Secondary | ICD-10-CM | POA: Insufficient documentation

## 2017-11-07 DIAGNOSIS — Z87891 Personal history of nicotine dependence: Secondary | ICD-10-CM | POA: Insufficient documentation

## 2017-11-07 DIAGNOSIS — R001 Bradycardia, unspecified: Secondary | ICD-10-CM | POA: Insufficient documentation

## 2017-11-07 DIAGNOSIS — Z79899 Other long term (current) drug therapy: Secondary | ICD-10-CM

## 2017-11-07 DIAGNOSIS — Z8673 Personal history of transient ischemic attack (TIA), and cerebral infarction without residual deficits: Secondary | ICD-10-CM

## 2017-11-07 DIAGNOSIS — Z85828 Personal history of other malignant neoplasm of skin: Secondary | ICD-10-CM

## 2017-11-07 DIAGNOSIS — Z9889 Other specified postprocedural states: Secondary | ICD-10-CM

## 2017-11-07 DIAGNOSIS — J9811 Atelectasis: Secondary | ICD-10-CM | POA: Insufficient documentation

## 2017-11-07 DIAGNOSIS — E785 Hyperlipidemia, unspecified: Secondary | ICD-10-CM

## 2017-11-07 DIAGNOSIS — R911 Solitary pulmonary nodule: Secondary | ICD-10-CM | POA: Insufficient documentation

## 2017-11-07 DIAGNOSIS — I6523 Occlusion and stenosis of bilateral carotid arteries: Secondary | ICD-10-CM | POA: Insufficient documentation

## 2017-11-07 DIAGNOSIS — Z01812 Encounter for preprocedural laboratory examination: Secondary | ICD-10-CM | POA: Insufficient documentation

## 2017-11-07 DIAGNOSIS — I739 Peripheral vascular disease, unspecified: Secondary | ICD-10-CM | POA: Insufficient documentation

## 2017-11-07 DIAGNOSIS — M1990 Unspecified osteoarthritis, unspecified site: Secondary | ICD-10-CM | POA: Insufficient documentation

## 2017-11-07 DIAGNOSIS — Z01810 Encounter for preprocedural cardiovascular examination: Secondary | ICD-10-CM | POA: Insufficient documentation

## 2017-11-07 DIAGNOSIS — I10 Essential (primary) hypertension: Secondary | ICD-10-CM | POA: Insufficient documentation

## 2017-11-07 DIAGNOSIS — I2510 Atherosclerotic heart disease of native coronary artery without angina pectoris: Secondary | ICD-10-CM

## 2017-11-07 DIAGNOSIS — Z7982 Long term (current) use of aspirin: Secondary | ICD-10-CM | POA: Insufficient documentation

## 2017-11-07 DIAGNOSIS — D696 Thrombocytopenia, unspecified: Secondary | ICD-10-CM | POA: Insufficient documentation

## 2017-11-07 HISTORY — DX: Chronic kidney disease, unspecified: N18.9

## 2017-11-07 HISTORY — DX: Zoster without complications: B02.9

## 2017-11-07 HISTORY — DX: Malignant (primary) neoplasm, unspecified: C80.1

## 2017-11-07 HISTORY — DX: Personal history of other diseases of the digestive system: Z87.19

## 2017-11-07 HISTORY — DX: Unspecified osteoarthritis, unspecified site: M19.90

## 2017-11-07 LAB — PROTIME-INR
INR: 1.03
Prothrombin Time: 13.4 seconds (ref 11.4–15.2)

## 2017-11-07 LAB — COMPREHENSIVE METABOLIC PANEL
ALBUMIN: 3.5 g/dL (ref 3.5–5.0)
ALT: 19 U/L (ref 17–63)
ANION GAP: 10 (ref 5–15)
AST: 28 U/L (ref 15–41)
Alkaline Phosphatase: 43 U/L (ref 38–126)
BUN: 12 mg/dL (ref 6–20)
CO2: 21 mmol/L — ABNORMAL LOW (ref 22–32)
Calcium: 8.8 mg/dL — ABNORMAL LOW (ref 8.9–10.3)
Chloride: 107 mmol/L (ref 101–111)
Creatinine, Ser: 0.79 mg/dL (ref 0.61–1.24)
GFR calc Af Amer: >60 mL/min (ref >60)
GFR calc non Af Amer: >60 mL/min (ref >60)
GLUCOSE: 86 mg/dL (ref 65–99)
POTASSIUM: 4 mmol/L (ref 3.5–5.1)
Sodium: 138 mmol/L (ref 135–145)
Total Bilirubin: 0.9 mg/dL (ref 0.3–1.2)
Total Protein: 6.4 g/dL — ABNORMAL LOW (ref 6.5–8.1)

## 2017-11-07 LAB — URINALYSIS, ROUTINE W REFLEX MICROSCOPIC
BILIRUBIN URINE: NEGATIVE
Glucose, UA: NEGATIVE mg/dL
HGB URINE DIPSTICK: NEGATIVE
Ketones, ur: NEGATIVE mg/dL
Leukocytes, UA: NEGATIVE
Nitrite: NEGATIVE
PROTEIN: NEGATIVE mg/dL
SPECIFIC GRAVITY, URINE: 1.018 (ref 1.005–1.030)
pH: 6 (ref 5.0–8.0)

## 2017-11-07 LAB — PULMONARY FUNCTION TEST
DL/VA % pred: 108 %
DL/VA: 4.84 ml/min/mmHg/L
DLCO UNC % PRED: 76 %
DLCO UNC: 22.58 ml/min/mmHg
FEF 25-75 Pre: 0.86 L/sec
FEF2575-%Pred-Pre: 46 %
FEV1-%Pred-Pre: 60 %
FEV1-Pre: 1.61 L
FEV1FVC-%Pred-Pre: 83 %
FEV6-%PRED-PRE: 74 %
FEV6-PRE: 2.6 L
FEV6FVC-%Pred-Pre: 104 %
FVC-%Pred-Pre: 71 %
FVC-Pre: 2.69 L
PRE FEV6/FVC RATIO: 97 %
Pre FEV1/FVC ratio: 60 %
RV % PRED: 121 %
RV: 3.09 L
TLC % pred: 87 %
TLC: 5.81 L

## 2017-11-07 LAB — CBC
HCT: 46.1 % (ref 39.0–52.0)
HEMOGLOBIN: 15.8 g/dL (ref 13.0–17.0)
MCH: 32.3 pg (ref 26.0–34.0)
MCHC: 34.3 g/dL (ref 30.0–36.0)
MCV: 94.3 fL (ref 78.0–100.0)
PLATELETS: 149 10*3/uL — AB (ref 150–400)
RBC: 4.89 MIL/uL (ref 4.22–5.81)
RDW: 13.6 % (ref 11.5–15.5)
WBC: 7.5 10*3/uL (ref 4.0–10.5)

## 2017-11-07 LAB — TYPE AND SCREEN
ABO/RH(D): O POS
ANTIBODY SCREEN: NEGATIVE

## 2017-11-07 LAB — BLOOD GAS, ARTERIAL
Acid-Base Excess: 0.1 mmol/L (ref 0.0–2.0)
BICARBONATE: 23.5 mmol/L (ref 20.0–28.0)
Drawn by: 44984
FIO2: 0.21
O2 SAT: 97.4 %
PATIENT TEMPERATURE: 98.6
pCO2 arterial: 33.3 mmHg (ref 32.0–48.0)
pH, Arterial: 7.462 — ABNORMAL HIGH (ref 7.350–7.450)
pO2, Arterial: 89.8 mmHg (ref 83.0–108.0)

## 2017-11-07 LAB — SURGICAL PCR SCREEN
MRSA, PCR: NEGATIVE
Staphylococcus aureus: NEGATIVE

## 2017-11-07 LAB — APTT: APTT: 29 s (ref 24–36)

## 2017-11-07 NOTE — Progress Notes (Signed)
Pt. Reports that Dr. Roxan Hockey gave him instructions to hold Plavix after 11/02/2017 which he reports that he has complied with. Pt. Chart will be referred to anesth. Review, pt. Reports that he recently was treated with two antibiotics for  Bronchitis, pt. Denies any lingering problem with resp./ chest concerns.

## 2017-11-08 NOTE — Progress Notes (Addendum)
Anesthesia Chart Review: Patient is a 77 year old male scheduled for right VATS, wedge resection, possible segmentectomy on 11/09/17 by Dr. Modesto Charon. He has a RLL lung nodule concerning for lung cancer.   History includes former smoker (quit '74), HTN, HLD, CAD (coronary calcifications by chest CT), bradycardia, CVA/TIA, carotid artery stenosis (s/p bilateral carotid endarterectomies; left '05, right '12), PAD, thrombocytopenia, renal cysts, hiatal hernia, arthritis, skin cancer ([facility]CC), tonsillectomy, septoplasty '60. He was treated for bronchitis 10/23/17.  PCP is Dr. Gilford Rile. Cardiologist is Dr. Jenne Campus. Last visit 10/04/17. Patient was having symptoms of vertigo--not felt related to his bradycardia. He will consider ordering a Holter monitor this summer. Patient is on ASA and Plavix for carotid artery disease.  Pulmonologist is Dr. Christinia Gully.   Meds includes ASA 81 mg, Klonopin, Plavix (last dose 11/02/17), Welchol, HCTZ, losartan, Crestor, Valtrex.   BP 116/63   Pulse 60   Temp 36.6 C   Resp 20   Ht 5' 8.5" (1.74 m)   Wt 193 lb 11.2 oz (87.9 kg)   SpO2 99%   BMI 29.02 kg/m   EKG 11/07/17: SB at 54 bpm with first degree AV block with PACs.   Stress test 05/28/15: Report requested for [facility]. According to 06/10/16 office note by [name] (Care Everywhere), "it is negative for exercise-induced myocardial ischemia. In the matter-of-fact he did quite well on the treadmill more than 8 minutes."  Exercise stress echo 01/21/10: Conclusions: Normal stress echo at 89% PMHR. Negative stress ECG. Frequent PACs (at times as couplets) and occasional PVCs were noted during exercise and early recover. The PVCs occurred singly. Normal BP response. Normal LV systolic function.   Carotid U/S 01/07/16: Impression: Patent bilateral carotid endarterectomy site with no bilateral internal carotid artery stenoses.  CXR 11/07/17: IMPRESSION: 1.  Right base subsegmental atelectasis. 2.  Right  lung nodule best identified by prior CT  CT Chest 10/09/17: IMPRESSION: - Continued potential enlargement of the LEFT lower lobe pulmonary nodule. This nodule was not hypermetabolic on comparison PET-CT scan however there are some limitations of imaging small nodules in the lung bases as mentioned. As this lesion would be difficult to access percutaneously due to diaphragmatic motion, recommend continued CT follow-up and if lesion continues to increase in size consider bronchoscopy with tissue sampling. - Aortic Atherosclerosis (ICD10-I70.0).  PET scan 04/04/17: IMPRESSION: 1. These slowly enlarging 9 mm right lower lobe pulmonary nodule along the right hemidiaphragm does not appear hypermetabolic today. Location adjacent to the hemidiaphragm can cause false negatives due to motion artifact related to the diaphragm, and on the basis of the potential slow growth over the last 6 months, this nodule should be surveilled. I would suggest a follow up noncontrast chest CT in 3-6 months time. 2. Other imaging findings of potential clinical significance: Mild cardiomegaly. Aortic Atherosclerosis (ICD10-I70.0). Coronary atherosclerosis. Small type 1 hiatal hernia. Renal cysts. Lipoma at the junction of the second and third portions of the duodenum.  PFTs 11/07/17: FVC 2.69 (71%), FEV1 1.61 (60%), DLCO unc 22.58 (76%).   Preoperative labs noted.   Patient with history of coronary calcifications and bradycardia, now with lung nodule suspicious for cancer. He was seen by cardiology last month. Dr. Roxan Hockey classified him as Zubrod Score 0 (normal activities, no symptoms). He was treated for bronchitis last month, but by PAT RN notes, he denied any lingering respiratory concerns. Based on currently available information, I would anticipate that he can proceed as planned; however, Dr. Leonarda Salon 09/2517  office note mentions touching base with patient's cardiologist prior to surgery. Ryan at [facility] to  follow-up. (Update 3:41 PM: Thurmond Butts reported that she has not heard back from Dr. Roxan Hockey yet as he has been in surgery for most of the day. She thought Dr. Roxan Hockey had communicated with patient's cardiologist, but wanted to verify. She will provide an update once available. If I don't have an update by 5:00 PM then anesthesiologist will have to discuss with surgeon on the day of surgery.)   George Hugh [facility] Short Stay Center/Anesthesiology Phone [phone] 11/08/2017 10:40 AM

## 2017-11-09 ENCOUNTER — Inpatient Hospital Stay (HOSPITAL_COMMUNITY): Payer: Medicare Other | Admitting: Certified Registered Nurse Anesthetist

## 2017-11-09 ENCOUNTER — Inpatient Hospital Stay (HOSPITAL_COMMUNITY): Payer: Medicare Other | Admitting: Vascular Surgery

## 2017-11-09 ENCOUNTER — Inpatient Hospital Stay (HOSPITAL_COMMUNITY)
Admission: RE | Admit: 2017-11-09 | Discharge: 2017-11-13 | DRG: 164 | Disposition: A | Discharge status: Home / Self Care | Payer: Medicare Other | Source: Ambulatory Visit | Attending: Thoracic Surgery (Cardiothoracic Vascular Surgery) | Admitting: Thoracic Surgery (Cardiothoracic Vascular Surgery)

## 2017-11-09 ENCOUNTER — Inpatient Hospital Stay (HOSPITAL_COMMUNITY): Payer: Medicare Other

## 2017-11-09 ENCOUNTER — Encounter (HOSPITAL_COMMUNITY): Payer: Self-pay | Admitting: Urology

## 2017-11-09 ENCOUNTER — Encounter (HOSPITAL_COMMUNITY)
Admission: RE | Disposition: A | Discharge status: Home / Self Care | Payer: Self-pay | Source: Ambulatory Visit | Attending: Thoracic Surgery (Cardiothoracic Vascular Surgery)

## 2017-11-09 ENCOUNTER — Other Ambulatory Visit: Payer: Self-pay

## 2017-11-09 DIAGNOSIS — I491 Atrial premature depolarization: Secondary | ICD-10-CM | POA: Diagnosis not present

## 2017-11-09 DIAGNOSIS — Z87891 Personal history of nicotine dependence: Secondary | ICD-10-CM | POA: Diagnosis not present

## 2017-11-09 DIAGNOSIS — I10 Essential (primary) hypertension: Secondary | ICD-10-CM | POA: Diagnosis present

## 2017-11-09 DIAGNOSIS — Z09 Encounter for follow-up examination after completed treatment for conditions other than malignant neoplasm: Secondary | ICD-10-CM

## 2017-11-09 DIAGNOSIS — Z8673 Personal history of transient ischemic attack (TIA), and cerebral infarction without residual deficits: Secondary | ICD-10-CM

## 2017-11-09 DIAGNOSIS — R252 Cramp and spasm: Secondary | ICD-10-CM | POA: Diagnosis present

## 2017-11-09 DIAGNOSIS — Z803 Family history of malignant neoplasm of breast: Secondary | ICD-10-CM | POA: Diagnosis not present

## 2017-11-09 DIAGNOSIS — Z7902 Long term (current) use of antithrombotics/antiplatelets: Secondary | ICD-10-CM | POA: Diagnosis not present

## 2017-11-09 DIAGNOSIS — R911 Solitary pulmonary nodule: Secondary | ICD-10-CM | POA: Diagnosis present

## 2017-11-09 DIAGNOSIS — E785 Hyperlipidemia, unspecified: Secondary | ICD-10-CM | POA: Diagnosis present

## 2017-11-09 DIAGNOSIS — D696 Thrombocytopenia, unspecified: Secondary | ICD-10-CM | POA: Diagnosis present

## 2017-11-09 DIAGNOSIS — I2510 Atherosclerotic heart disease of native coronary artery without angina pectoris: Secondary | ICD-10-CM | POA: Diagnosis present

## 2017-11-09 DIAGNOSIS — C3431 Malignant neoplasm of lower lobe, right bronchus or lung: Principal | ICD-10-CM | POA: Diagnosis present

## 2017-11-09 DIAGNOSIS — I739 Peripheral vascular disease, unspecified: Secondary | ICD-10-CM | POA: Diagnosis present

## 2017-11-09 DIAGNOSIS — Z801 Family history of malignant neoplasm of trachea, bronchus and lung: Secondary | ICD-10-CM | POA: Diagnosis not present

## 2017-11-09 DIAGNOSIS — Z8249 Family history of ischemic heart disease and other diseases of the circulatory system: Secondary | ICD-10-CM

## 2017-11-09 DIAGNOSIS — R410 Disorientation, unspecified: Secondary | ICD-10-CM | POA: Diagnosis not present

## 2017-11-09 DIAGNOSIS — Z823 Family history of stroke: Secondary | ICD-10-CM

## 2017-11-09 DIAGNOSIS — Z9689 Presence of other specified functional implants: Secondary | ICD-10-CM

## 2017-11-09 DIAGNOSIS — Z808 Family history of malignant neoplasm of other organs or systems: Secondary | ICD-10-CM | POA: Diagnosis not present

## 2017-11-09 DIAGNOSIS — R001 Bradycardia, unspecified: Secondary | ICD-10-CM | POA: Diagnosis present

## 2017-11-09 DIAGNOSIS — Z7982 Long term (current) use of aspirin: Secondary | ICD-10-CM

## 2017-11-09 DIAGNOSIS — J9811 Atelectasis: Secondary | ICD-10-CM | POA: Diagnosis not present

## 2017-11-09 DIAGNOSIS — Z9889 Other specified postprocedural states: Secondary | ICD-10-CM

## 2017-11-09 DIAGNOSIS — Z79899 Other long term (current) drug therapy: Secondary | ICD-10-CM | POA: Diagnosis not present

## 2017-11-09 DIAGNOSIS — J939 Pneumothorax, unspecified: Secondary | ICD-10-CM

## 2017-11-09 HISTORY — DX: Solitary pulmonary nodule: R91.1

## 2017-11-09 HISTORY — PX: VIDEO ASSISTED THORACOSCOPY (VATS)/WEDGE RESECTION: SHX6174

## 2017-11-09 LAB — GLUCOSE, CAPILLARY
GLUCOSE-CAPILLARY: 201 mg/dL — AB (ref 65–99)
Glucose-Capillary: 139 mg/dL — ABNORMAL HIGH (ref 65–99)

## 2017-11-09 SURGERY — VIDEO ASSISTED THORACOSCOPY (VATS)/WEDGE RESECTION
Anesthesia: General | Site: Chest | Laterality: Right

## 2017-11-09 MED ORDER — PHENYLEPHRINE HCL 10 MG/ML IJ SOLN
INTRAMUSCULAR | Status: DC | PRN
  Administered 2017-11-09: 80 ug via INTRAVENOUS

## 2017-11-09 MED ORDER — HYDROMORPHONE HCL 1 MG/ML IJ SOLN
INTRAMUSCULAR | Status: AC
  Filled 2017-11-09: qty 1

## 2017-11-09 MED ORDER — POTASSIUM CHLORIDE 10 MEQ/50ML IV SOLN
10.0000 meq | Freq: Every day | INTRAVENOUS | Status: DC | PRN
  Administered 2017-11-11: 10 meq via INTRAVENOUS
  Filled 2017-11-09: qty 50

## 2017-11-09 MED ORDER — DIPHENHYDRAMINE HCL 12.5 MG/5ML PO ELIX
12.5000 mg | ORAL_SOLUTION | Freq: Four times a day (QID) | ORAL | Status: DC | PRN
  Filled 2017-11-09: qty 5

## 2017-11-09 MED ORDER — TRAMADOL HCL 50 MG PO TABS
50.0000 mg | ORAL_TABLET | Freq: Four times a day (QID) | ORAL | Status: DC | PRN
  Administered 2017-11-10: 50 mg via ORAL
  Filled 2017-11-09: qty 1

## 2017-11-09 MED ORDER — ONDANSETRON HCL 4 MG/2ML IJ SOLN
INTRAMUSCULAR | Status: AC
  Filled 2017-11-09: qty 2

## 2017-11-09 MED ORDER — ROCURONIUM BROMIDE 10 MG/ML (PF) SYRINGE
PREFILLED_SYRINGE | INTRAVENOUS | Status: AC
  Filled 2017-11-09: qty 5

## 2017-11-09 MED ORDER — FENTANYL CITRATE (PF) 250 MCG/5ML IJ SOLN
INTRAMUSCULAR | Status: AC
  Filled 2017-11-09: qty 5

## 2017-11-09 MED ORDER — MIDAZOLAM HCL 2 MG/2ML IJ SOLN
INTRAMUSCULAR | Status: AC
  Filled 2017-11-09: qty 2

## 2017-11-09 MED ORDER — LACTATED RINGERS IV SOLN
INTRAVENOUS | Status: DC | PRN
  Administered 2017-11-09: 08:00:00 via INTRAVENOUS

## 2017-11-09 MED ORDER — DEXAMETHASONE SODIUM PHOSPHATE 10 MG/ML IJ SOLN
INTRAMUSCULAR | Status: DC | PRN
  Administered 2017-11-09: 10 mg via INTRAVENOUS

## 2017-11-09 MED ORDER — FENTANYL 40 MCG/ML IV SOLN
INTRAVENOUS | Status: DC
  Administered 2017-11-09: 15 ug via INTRAVENOUS
  Administered 2017-11-09: 1000 ug via INTRAVENOUS
  Administered 2017-11-09: 15 ug via INTRAVENOUS
  Administered 2017-11-10: 60 ug via INTRAVENOUS
  Administered 2017-11-10: 0 ug via INTRAVENOUS
  Administered 2017-11-10: 60 ug via INTRAVENOUS
  Administered 2017-11-10: 45 ug via INTRAVENOUS
  Administered 2017-11-10: 15 ug via INTRAVENOUS
  Filled 2017-11-09: qty 25

## 2017-11-09 MED ORDER — SUGAMMADEX SODIUM 200 MG/2ML IV SOLN
INTRAVENOUS | Status: AC
  Filled 2017-11-09: qty 2

## 2017-11-09 MED ORDER — SODIUM CHLORIDE 0.9 % IV SOLN
1.5000 g | INTRAVENOUS | Status: AC
  Administered 2017-11-09: 1.5 g via INTRAVENOUS

## 2017-11-09 MED ORDER — PHENYLEPHRINE HCL 10 MG/ML IJ SOLN
INTRAVENOUS | Status: DC | PRN
  Administered 2017-11-09: 15 ug/min via INTRAVENOUS

## 2017-11-09 MED ORDER — OXYCODONE HCL 5 MG PO TABS
ORAL_TABLET | ORAL | Status: AC
  Filled 2017-11-09: qty 1

## 2017-11-09 MED ORDER — ONDANSETRON HCL 4 MG/2ML IJ SOLN: 4.0000 mg | Freq: Four times a day (QID) | INTRAMUSCULAR | Status: DC | PRN

## 2017-11-09 MED ORDER — DIPHENHYDRAMINE HCL 50 MG/ML IJ SOLN: 12.5000 mg | Freq: Four times a day (QID) | INTRAMUSCULAR | Status: DC | PRN

## 2017-11-09 MED ORDER — SODIUM CHLORIDE 0.9% FLUSH: 9.0000 mL | INTRAVENOUS | Status: DC | PRN

## 2017-11-09 MED ORDER — BISACODYL 5 MG PO TBEC
10.0000 mg | DELAYED_RELEASE_TABLET | Freq: Every day | ORAL | Status: DC
  Administered 2017-11-10 – 2017-11-13 (×4): 10 mg via ORAL
  Filled 2017-11-09 (×4): qty 2

## 2017-11-09 MED ORDER — ROCURONIUM BROMIDE 100 MG/10ML IV SOLN
INTRAVENOUS | Status: DC | PRN
  Administered 2017-11-09: 20 mg via INTRAVENOUS
  Administered 2017-11-09: 50 mg via INTRAVENOUS
  Administered 2017-11-09: 20 mg via INTRAVENOUS

## 2017-11-09 MED ORDER — BUPIVACAINE HCL (PF) 0.5 % IJ SOLN
INTRAMUSCULAR | Status: DC | PRN
  Administered 2017-11-09: 5 mL

## 2017-11-09 MED ORDER — OXYCODONE HCL 5 MG PO TABS
5.0000 mg | ORAL_TABLET | Freq: Once | ORAL | Status: AC | PRN
  Administered 2017-11-09: 5 mg via ORAL

## 2017-11-09 MED ORDER — MIDAZOLAM HCL 5 MG/5ML IJ SOLN
INTRAMUSCULAR | Status: DC | PRN
  Administered 2017-11-09: 2 mg via INTRAVENOUS

## 2017-11-09 MED ORDER — FENTANYL CITRATE (PF) 100 MCG/2ML IJ SOLN: 25.0000 ug | INTRAMUSCULAR | Status: DC | PRN

## 2017-11-09 MED ORDER — GLYCOPYRROLATE 0.2 MG/ML IJ SOLN
INTRAMUSCULAR | Status: DC | PRN
  Administered 2017-11-09: 0.2 mg via INTRAVENOUS

## 2017-11-09 MED ORDER — NALOXONE HCL 0.4 MG/ML IJ SOLN: 0.4000 mg | INTRAMUSCULAR | Status: DC | PRN

## 2017-11-09 MED ORDER — CLOPIDOGREL BISULFATE 75 MG PO TABS
75.0000 mg | ORAL_TABLET | Freq: Every day | ORAL | Status: DC
  Administered 2017-11-10 – 2017-11-12 (×3): 75 mg via ORAL
  Filled 2017-11-09 (×3): qty 1

## 2017-11-09 MED ORDER — ONDANSETRON HCL 4 MG/2ML IJ SOLN
INTRAMUSCULAR | Status: DC | PRN
  Administered 2017-11-09: 4 mg via INTRAVENOUS

## 2017-11-09 MED ORDER — SENNOSIDES-DOCUSATE SODIUM 8.6-50 MG PO TABS
1.0000 | ORAL_TABLET | Freq: Every day | ORAL | Status: DC
  Administered 2017-11-10 – 2017-11-12 (×3): 1 via ORAL
  Filled 2017-11-09 (×3): qty 1

## 2017-11-09 MED ORDER — LIDOCAINE 2% (20 MG/ML) 5 ML SYRINGE
INTRAMUSCULAR | Status: AC
  Filled 2017-11-09: qty 5

## 2017-11-09 MED ORDER — BUPIVACAINE HCL (PF) 0.5 % IJ SOLN
INTRAMUSCULAR | Status: AC
  Filled 2017-11-09: qty 10

## 2017-11-09 MED ORDER — ROSUVASTATIN CALCIUM 20 MG PO TABS
40.0000 mg | ORAL_TABLET | Freq: Every day | ORAL | Status: DC
  Administered 2017-11-10 – 2017-11-12 (×3): 40 mg via ORAL
  Filled 2017-11-09 (×3): qty 2

## 2017-11-09 MED ORDER — PROPOFOL 10 MG/ML IV BOLUS
INTRAVENOUS | Status: DC | PRN
  Administered 2017-11-09: 140 mg via INTRAVENOUS

## 2017-11-09 MED ORDER — ACETAMINOPHEN 160 MG/5ML PO SOLN: 1000.0000 mg | Freq: Four times a day (QID) | ORAL | Status: DC

## 2017-11-09 MED ORDER — HYDROMORPHONE HCL 1 MG/ML IJ SOLN
0.2500 mg | INTRAMUSCULAR | Status: DC | PRN
  Administered 2017-11-09 (×2): 0.5 mg via INTRAVENOUS

## 2017-11-09 MED ORDER — SUGAMMADEX SODIUM 200 MG/2ML IV SOLN
INTRAVENOUS | Status: DC | PRN
  Administered 2017-11-09: 200 mg via INTRAVENOUS

## 2017-11-09 MED ORDER — INSULIN ASPART 100 UNIT/ML ~~LOC~~ SOLN
0.0000 [IU] | SUBCUTANEOUS | Status: DC
  Administered 2017-11-09: 4 [IU] via SUBCUTANEOUS
  Administered 2017-11-10 (×2): 2 [IU] via SUBCUTANEOUS

## 2017-11-09 MED ORDER — DEXTROSE-NACL 5-0.45 % IV SOLN
INTRAVENOUS | Status: DC
  Administered 2017-11-09: 16:00:00 via INTRAVENOUS
  Administered 2017-11-10: 100 mL/h via INTRAVENOUS

## 2017-11-09 MED ORDER — DEXAMETHASONE SODIUM PHOSPHATE 10 MG/ML IJ SOLN
INTRAMUSCULAR | Status: AC
  Filled 2017-11-09: qty 1

## 2017-11-09 MED ORDER — SODIUM CHLORIDE 0.9 % IV SOLN
1.5000 g | Freq: Two times a day (BID) | INTRAVENOUS | Status: AC
  Administered 2017-11-09 – 2017-11-10 (×2): 1.5 g via INTRAVENOUS
  Filled 2017-11-09 (×2): qty 1.5

## 2017-11-09 MED ORDER — FENTANYL CITRATE (PF) 100 MCG/2ML IJ SOLN
INTRAMUSCULAR | Status: DC | PRN
  Administered 2017-11-09 (×3): 50 ug via INTRAVENOUS
  Administered 2017-11-09: 100 ug via INTRAVENOUS

## 2017-11-09 MED ORDER — PROPOFOL 10 MG/ML IV BOLUS
INTRAVENOUS | Status: AC
  Filled 2017-11-09: qty 20

## 2017-11-09 MED ORDER — ACETAMINOPHEN 500 MG PO TABS
1000.0000 mg | ORAL_TABLET | Freq: Four times a day (QID) | ORAL | Status: DC
  Administered 2017-11-09 – 2017-11-13 (×10): 1000 mg via ORAL
  Filled 2017-11-09 (×10): qty 2

## 2017-11-09 MED ORDER — SODIUM CHLORIDE 0.9 % IV SOLN
INTRAVENOUS | Status: AC
  Filled 2017-11-09: qty 1.5

## 2017-11-09 MED ORDER — SUCCINYLCHOLINE CHLORIDE 200 MG/10ML IV SOSY
PREFILLED_SYRINGE | INTRAVENOUS | Status: AC
  Filled 2017-11-09: qty 10

## 2017-11-09 MED ORDER — CLONAZEPAM 0.5 MG PO TABS: 0.2500 mg | ORAL_TABLET | Freq: Every evening | ORAL | Status: DC | PRN

## 2017-11-09 MED ORDER — LACTATED RINGERS IV SOLN
INTRAVENOUS | Status: DC
  Administered 2017-11-09: 07:00:00 via INTRAVENOUS

## 2017-11-09 MED ORDER — CLONAZEPAM 0.25 MG PO TBDP
0.2500 mg | ORAL_TABLET | Freq: Every evening | ORAL | Status: DC | PRN
  Administered 2017-11-10 – 2017-11-12 (×3): 0.25 mg via ORAL
  Filled 2017-11-09 (×3): qty 1

## 2017-11-09 MED ORDER — BUPIVACAINE 0.5 % ON-Q PUMP SINGLE CATH 400 ML
400.0000 mL | INJECTION | Status: AC
  Administered 2017-11-09: 400 mL
  Filled 2017-11-09: qty 400

## 2017-11-09 MED ORDER — EPHEDRINE 5 MG/ML INJ
INTRAVENOUS | Status: AC
  Filled 2017-11-09: qty 10

## 2017-11-09 MED ORDER — BUPIVACAINE ON-Q PAIN PUMP (FOR ORDER SET NO CHG)
INJECTION | Status: AC
  Filled 2017-11-09: qty 1

## 2017-11-09 MED ORDER — OXYCODONE HCL 5 MG/5ML PO SOLN: 5.0000 mg | Freq: Once | ORAL | Status: AC | PRN

## 2017-11-09 MED ORDER — PHENYLEPHRINE 40 MCG/ML (10ML) SYRINGE FOR IV PUSH (FOR BLOOD PRESSURE SUPPORT)
PREFILLED_SYRINGE | INTRAVENOUS | Status: AC
  Filled 2017-11-09: qty 10

## 2017-11-09 MED ORDER — OXYCODONE HCL 5 MG PO TABS
5.0000 mg | ORAL_TABLET | ORAL | Status: DC | PRN
  Administered 2017-11-10: 5 mg via ORAL
  Filled 2017-11-09: qty 1

## 2017-11-09 MED ORDER — ASPIRIN EC 81 MG PO TBEC
81.0000 mg | DELAYED_RELEASE_TABLET | Freq: Every day | ORAL | Status: DC
  Administered 2017-11-10 – 2017-11-13 (×4): 81 mg via ORAL
  Filled 2017-11-09 (×4): qty 1

## 2017-11-09 MED ORDER — COLESEVELAM HCL 625 MG PO TABS
1250.0000 mg | ORAL_TABLET | Freq: Two times a day (BID) | ORAL | Status: DC
  Administered 2017-11-09 – 2017-11-12 (×6): 1250 mg via ORAL
  Filled 2017-11-09 (×8): qty 2

## 2017-11-09 SURGICAL SUPPLY — 89 items
BENZOIN TINCTURE PRP APPL 2/3 (GAUZE/BANDAGES/DRESSINGS) IMPLANT
CANISTER SUCT 3000ML PPV (MISCELLANEOUS) ×6 IMPLANT
CATH KIT ON Q 5IN SLV (PAIN MANAGEMENT) ×3 IMPLANT
CATH THORACIC 28FR (CATHETERS) IMPLANT
CATH THORACIC 36FR (CATHETERS) IMPLANT
CATH THORACIC 36FR RT ANG (CATHETERS) IMPLANT
CLIP VESOCCLUDE MED 6/CT (CLIP) ×3 IMPLANT
CONN ST 1/4X3/8  BEN (MISCELLANEOUS)
CONN ST 1/4X3/8 BEN (MISCELLANEOUS) IMPLANT
CONN Y 3/8X3/8X3/8  BEN (MISCELLANEOUS)
CONN Y 3/8X3/8X3/8 BEN (MISCELLANEOUS) IMPLANT
CONT SPEC 4OZ CLIKSEAL STRL BL (MISCELLANEOUS) ×36 IMPLANT
COVER SURGICAL LIGHT HANDLE (MISCELLANEOUS) ×3 IMPLANT
DERMABOND ADVANCED (GAUZE/BANDAGES/DRESSINGS) ×1
DERMABOND ADVANCED .7 DNX12 (GAUZE/BANDAGES/DRESSINGS) ×2 IMPLANT
DRAIN CHANNEL 28F RND 3/8 FF (WOUND CARE) IMPLANT
DRAIN CHANNEL 32F RND 10.7 FF (WOUND CARE) IMPLANT
DRAPE LAPAROSCOPIC ABDOMINAL (DRAPES) ×3 IMPLANT
DRAPE WARM FLUID 44X44 (DRAPE) ×3 IMPLANT
ELECT BLADE 6.5 EXT (BLADE) ×3 IMPLANT
ELECT REM PT RETURN 9FT ADLT (ELECTROSURGICAL) ×3
ELECTRODE REM PT RTRN 9FT ADLT (ELECTROSURGICAL) ×2 IMPLANT
GAUZE SPONGE 4X4 12PLY STRL (GAUZE/BANDAGES/DRESSINGS) IMPLANT
GAUZE SPONGE 4X4 12PLY STRL LF (GAUZE/BANDAGES/DRESSINGS) ×3 IMPLANT
GLOVE SURG SIGNA 7.5 PF LTX (GLOVE) ×6 IMPLANT
GOWN STRL REUS W/ TWL LRG LVL3 (GOWN DISPOSABLE) ×6 IMPLANT
GOWN STRL REUS W/ TWL XL LVL3 (GOWN DISPOSABLE) ×2 IMPLANT
GOWN STRL REUS W/TWL LRG LVL3 (GOWN DISPOSABLE) ×3
GOWN STRL REUS W/TWL XL LVL3 (GOWN DISPOSABLE) ×1
HANDLE STAPLE ENDO GIA SHORT (STAPLE)
HEMOSTAT SURGICEL 2X14 (HEMOSTASIS) IMPLANT
KIT BASIN OR (CUSTOM PROCEDURE TRAY) ×3 IMPLANT
KIT ROOM TURNOVER OR (KITS) ×3 IMPLANT
KIT SUCTION CATH 14FR (SUCTIONS) IMPLANT
NS IRRIG 1000ML POUR BTL (IV SOLUTION) ×9 IMPLANT
PACK CHEST (CUSTOM PROCEDURE TRAY) ×3 IMPLANT
PAD ARMBOARD 7.5X6 YLW CONV (MISCELLANEOUS) ×6 IMPLANT
POUCH ENDO CATCH II 15MM (MISCELLANEOUS) IMPLANT
POUCH SPECIMEN RETRIEVAL 10MM (ENDOMECHANICALS) ×3 IMPLANT
PROGEL SPRAY TIP 11IN (MISCELLANEOUS) ×3
RELOAD STAPLER GOLD 60MM (STAPLE) ×16 IMPLANT
RELOAD STAPLER GREEN 60MM (STAPLE) ×4 IMPLANT
SCISSORS ENDO CVD 5DCS (MISCELLANEOUS) IMPLANT
SEALANT PROGEL (MISCELLANEOUS) ×3 IMPLANT
SEALANT SURG COSEAL 4ML (VASCULAR PRODUCTS) IMPLANT
SEALANT SURG COSEAL 8ML (VASCULAR PRODUCTS) IMPLANT
SHEARS HARMONIC HDI 20CM (ELECTROSURGICAL) IMPLANT
SOLUTION ANTI FOG 6CC (MISCELLANEOUS) ×3 IMPLANT
SPECIMEN JAR MEDIUM (MISCELLANEOUS) IMPLANT
SPONGE INTESTINAL PEANUT (DISPOSABLE) ×6 IMPLANT
SPONGE TONSIL 1 RF SGL (DISPOSABLE) ×3 IMPLANT
STAPLE ECHEON FLEX 60 POW ENDO (STAPLE) ×3 IMPLANT
STAPLE RELOAD 2.5MM WHITE (STAPLE) ×3 IMPLANT
STAPLER ENDO GIA 12MM SHORT (STAPLE) IMPLANT
STAPLER RELOAD GOLD 60MM (STAPLE) ×24
STAPLER RELOAD GREEN 60MM (STAPLE) ×6
STAPLER VASCULAR ECHELON 35 (CUTTER) ×3 IMPLANT
SUT PROLENE 4 0 RB 1 (SUTURE) ×1
SUT PROLENE 4-0 RB1 .5 CRCL 36 (SUTURE) ×2 IMPLANT
SUT SILK  1 MH (SUTURE) ×2
SUT SILK 1 MH (SUTURE) ×4 IMPLANT
SUT SILK 1 TIES 10X30 (SUTURE) ×3 IMPLANT
SUT SILK 2 0 SH (SUTURE) IMPLANT
SUT SILK 2 0SH CR/8 30 (SUTURE) IMPLANT
SUT SILK 3 0 SH 30 (SUTURE) IMPLANT
SUT SILK 3 0SH CR/8 30 (SUTURE) ×3 IMPLANT
SUT VIC AB 0 CTX 27 (SUTURE) IMPLANT
SUT VIC AB 1 CTX 27 (SUTURE) ×3 IMPLANT
SUT VIC AB 2-0 CT1 27 (SUTURE)
SUT VIC AB 2-0 CT1 TAPERPNT 27 (SUTURE) IMPLANT
SUT VIC AB 2-0 CTX 36 (SUTURE) ×3 IMPLANT
SUT VIC AB 3-0 MH 27 (SUTURE) IMPLANT
SUT VIC AB 3-0 SH 27 (SUTURE)
SUT VIC AB 3-0 SH 27X BRD (SUTURE) IMPLANT
SUT VIC AB 3-0 X1 27 (SUTURE) ×3 IMPLANT
SUT VICRYL 0 UR6 27IN ABS (SUTURE) IMPLANT
SUT VICRYL 2 TP 1 (SUTURE) IMPLANT
SWAB CULTURE ESWAB REG 1ML (MISCELLANEOUS) IMPLANT
SYSTEM SAHARA CHEST DRAIN ATS (WOUND CARE) ×3 IMPLANT
TAPE CLOTH SURG 4X10 WHT LF (GAUZE/BANDAGES/DRESSINGS) ×3 IMPLANT
TIP APPLICATOR SPRAY EXTEND 16 (VASCULAR PRODUCTS) IMPLANT
TIP SPRAY PROGEL 11IN (MISCELLANEOUS) ×2 IMPLANT
TOWEL GREEN STERILE (TOWEL DISPOSABLE) ×3 IMPLANT
TOWEL GREEN STERILE FF (TOWEL DISPOSABLE) ×3 IMPLANT
TRAY FOLEY W/METER SILVER 16FR (SET/KITS/TRAYS/PACK) ×3 IMPLANT
TROCAR XCEL BLADELESS 5X75MML (TROCAR) ×3 IMPLANT
TROCAR XCEL NON-BLD 5MMX100MML (ENDOMECHANICALS) IMPLANT
TUNNELER SHEATH ON-Q 11GX8 DSP (PAIN MANAGEMENT) ×3 IMPLANT
WATER STERILE IRR 1000ML POUR (IV SOLUTION) ×3 IMPLANT

## 2017-11-09 NOTE — Anesthesia Postprocedure Evaluation (Signed)
Anesthesia Post Note  Patient: Louis Barton  Procedure(s) Performed: RIGHT VIDEO ASSISTED THORACOSCOPY WITH Chales Salmon RESECTION (Right Chest)     Patient location during evaluation: PACU Anesthesia Type: General Level of consciousness: awake, sedated and oriented Pain management: pain level controlled Vital Signs Assessment: post-procedure vital signs reviewed and stable Respiratory status: spontaneous breathing, nonlabored ventilation, respiratory function stable and patient connected to nasal cannula oxygen Cardiovascular status: blood pressure returned to baseline and stable Postop Assessment: no apparent nausea or vomiting Anesthetic complications: no    Last Vitals:  Vitals:   11/09/17 1129 11/09/17 1131  BP: (!) 134/102   Pulse: 100   Resp: 20 20  Temp:    SpO2: 98% 96%    Last Pain:  Vitals:   11/09/17 1131  TempSrc:   PainSc: 3                  Shravan Salahuddin,JAMES TERRILL

## 2017-11-09 NOTE — Anesthesia Procedure Notes (Signed)
Central Venous Catheter Insertion Performed by: Roberts Gaudy, MD, anesthesiologist Start/End2/14/2019 6:50 AM, 11/09/2017 7:00 AM Preanesthetic checklist: patient identified, IV checked, site marked, risks and benefits discussed, surgical consent, monitors and equipment checked, pre-op evaluation and timeout performed Position: Trendelenburg Catheter size: 8 Fr Double lumen Procedure performed using ultrasound guided technique. Ultrasound Notes:anatomy identified, needle tip was noted to be adjacent to the nerve/plexus identified and no ultrasound evidence of intravascular and/or intraneural injection Attempts: 1 Following insertion, line sutured, dressing applied and Biopatch. Post procedure assessment: blood return through all ports, free fluid flow and no air  Patient tolerated the procedure well with no immediate complications.

## 2017-11-09 NOTE — Interval H&P Note (Signed)
History and Physical Interval Note:  11/09/2017 7:50 AM  Louis Barton  has presented today for surgery, with the diagnosis of RLL NODULE  The various methods of treatment have been discussed with the patient and family. After consideration of risks, benefits and other options for treatment, the patient has consented to  Procedure(s): VIDEO ASSISTED THORACOSCOPY (VATS)/WEDGE RESECTION (Right) SEGMENTECTOMY, possible (Right) as a surgical intervention .  The patient's history has been reviewed, patient examined, no change in status, stable for surgery.  I have reviewed the patient's chart and labs.  Questions were answered to the patient's satisfaction.     [name]

## 2017-11-09 NOTE — Anesthesia Procedure Notes (Signed)
Procedure Name: Intubation Date/Time: 11/09/2017 8:13 AM Performed by: Shirlyn Goltz, CRNA Pre-anesthesia Checklist: Patient identified, Emergency Drugs available, Suction available and Patient being monitored Patient Re-evaluated:Patient Re-evaluated prior to induction Oxygen Delivery Method: Circle system utilized Preoxygenation: Pre-oxygenation with 100% oxygen Induction Type: IV induction Ventilation: Mask ventilation without difficulty Laryngoscope Size: Mac and 4 Grade View: Grade I Endobronchial tube: Left, Double lumen EBT, EBT position confirmed by auscultation and EBT position confirmed by fiberoptic bronchoscope and 39 Fr Number of attempts: 1 Placement Confirmation: ETT inserted through vocal cords under direct vision,  positive ETCO2 and breath sounds checked- equal and bilateral Tube secured with: Tape Dental Injury: Teeth and Oropharynx as per pre-operative assessment

## 2017-11-09 NOTE — Anesthesia Procedure Notes (Signed)
Arterial Line Insertion Start/End2/14/2019 7:00 AM, 11/09/2017 7:10 AM Performed by: Shirlyn Goltz, CRNA  Patient location: Pre-op. Preanesthetic checklist: patient identified, IV checked, site marked, risks and benefits discussed, surgical consent, monitors and equipment checked, pre-op evaluation and anesthesia consent Lidocaine 1% used for infiltration and patient sedated Right, radial was placed Catheter size: 20 G Hand hygiene performed  and maximum sterile barriers used  Allen's test indicative of satisfactory collateral circulation Attempts: 1 Procedure performed without using ultrasound guided technique. Ultrasound Notes:anatomy identified, needle tip was noted to be adjacent to the nerve/plexus identified and no ultrasound evidence of intravascular and/or intraneural injection Following insertion, Biopatch and dressing applied. Post procedure assessment: normal

## 2017-11-09 NOTE — Anesthesia Preprocedure Evaluation (Addendum)
Anesthesia Evaluation  Patient identified by MRN, date of birth, ID band Patient awake    Reviewed: Allergy & Precautions, NPO status , Patient's Chart, lab work & pertinent test results  Airway Mallampati: I  TM Distance: >3 FB Neck ROM: Full    Dental no notable dental hx.    Pulmonary former smoker,  Lung nodules   breath sounds clear to auscultation       Cardiovascular hypertension, + CAD and + Peripheral Vascular Disease   Rhythm:Regular Rate:Normal     Neuro/Psych    GI/Hepatic hiatal hernia,   Endo/Other    Renal/GU      Musculoskeletal   Abdominal   Peds  Hematology   Anesthesia Other Findings   Reproductive/Obstetrics                            Anesthesia Physical Anesthesia Plan  ASA: III  Anesthesia Plan: General   Post-op Pain Management:    Induction: Intravenous  PONV Risk Score and Plan: 3 and Treatment may vary due to age or medical condition, Dexamethasone and Ondansetron  Airway Management Planned: Double Lumen EBT  Additional Equipment: Arterial line and CVP  Intra-op Plan:   Post-operative Plan: Extubation in OR and Possible Post-op intubation/ventilation  Informed Consent: I have reviewed the patients History and Physical, chart, labs and discussed the procedure including the risks, benefits and alternatives for the proposed anesthesia with the patient or authorized representative who has indicated his/her understanding and acceptance.   Dental advisory given  Plan Discussed with: CRNA  Anesthesia Plan Comments:         Anesthesia Quick Evaluation

## 2017-11-09 NOTE — Brief Op Note (Addendum)
11/09/2017  10:34 AM  PATIENT:  Louis Barton  77 y.o. male  PRE-OPERATIVE DIAGNOSIS:  Right Lower Lobe NODULE  POST-OPERATIVE DIAGNOSIS:  Right Lower Lobe adenocarcinoma, Clinical stage IA  PROCEDURE:  Procedure(s): RIGHT VIDEO ASSISTED THORACOSCOPY WITH /WEDGE RESECTION (Right)  Thoracoscopic Right Lower Lobe Anterior Segmentectomy Mediastinal lymph node dissection On-Q local anesthetic catheter placement  SURGEON:  Surgeon(s) and Role:    [name], MD - Primary  PHYSICIAN ASSISTANT:  Nicholes Rough, PA-C   ANESTHESIA:   general  EBL:  75 mL   BLOOD ADMINISTERED:none  DRAINS: 28 F chest tube  LOCAL MEDICATIONS USED:  Bupivicaine- 5 ml  SPECIMEN:  Source of Specimen:  RIGHT LOWER LOBE SEGMENT  DISPOSITION OF SPECIMEN:  PATHOLOGY  COUNTS:  YES  DICTATION: .Dragon Dictation  PLAN OF CARE: Admit to inpatient   PATIENT DISPOSITION:  ICU - extubated and stable.   Delay start of Pharmacological VTE agent (>24hrs) due to surgical blood loss or risk of bleeding: no

## 2017-11-09 NOTE — Transfer of Care (Signed)
Immediate Anesthesia Transfer of Care Note  Patient: Louis Barton  Procedure(s) Performed: RIGHT VIDEO ASSISTED THORACOSCOPY WITH Chales Salmon RESECTION (Right Chest)  Patient Location: PACU  Anesthesia Type:General  Level of Consciousness: awake, alert , oriented and patient cooperative  Airway & Oxygen Therapy: Patient Spontanous Breathing and Patient connected to nasal cannula oxygen  Post-op Assessment: Report given to RN and Post -op Vital signs reviewed and stable  Post vital signs: Reviewed and stable  Last Vitals:  Vitals:   11/09/17 0611  BP: (!) 149/69  Pulse: 82  Resp: 20  Temp: (!) 36.3 C  SpO2: 98%    Last Pain:  Vitals:   11/09/17 0611  TempSrc: Oral         Complications: No apparent anesthesia complications

## 2017-11-09 NOTE — Op Note (Signed)
NAMEAGOSTINO, GORIN                 ACCOUNT NO.:  [id number]  MEDICAL RECORD NO.:  [id number]  LOCATION:                                 FACILITY:  PHYSICIAN:  Revonda Standard. Roxan Hockey, M.D. DATE OF BIRTH:  DATE OF PROCEDURE:  11/09/2017 DATE OF DISCHARGE:                              OPERATIVE REPORT   PREOPERATIVE DIAGNOSIS:  Right lower lobe nodule.  POSTOPERATIVE DIAGNOSIS:  Adenocarcinoma, right lower lobe- clinical stage IA.  PROCEDURES:   Right video-assisted thoracoscopy, Wedge resection of right lower lobe nodule, Right lower lobe anterior segmentectomy, Mediastinal lymph node dissection,  On-Q local anesthetic catheter placement.  SURGEON:  Revonda Standard. Roxan Hockey, MD.  ASSISTANT:  Nicholes Rough, PA.  ANESTHESIA:  General.  FINDINGS:  Nodule in anterior segment of right lower lobe.  Frozen section revealed adenocarcinoma.  Segmentectomy completed.  Bronchial margin free of tumor.  Normal-appearing nodes.  CLINICAL NOTE:  Mr. Entwistle is a 76 year old gentleman with a remote history of tobacco abuse.  More recently, he has had significant issues with cardiovascular disease.  About a year ago, he had left-sided chest pain.  A CT of the chest did not reveal an underlying cause, but did show a small right lower lobe lung nodule.  A PET-CT in May showed the nodule was unchanged in size and the SUV was 1.7.  A followup CT was done recently, which showed the nodule had increased in size from 7 x 10 mm to 10 x 13 mm.  He was advised to undergo wedge resection and possible segmentectomy for definitive diagnosis and treatment.  The indications, risks, benefits, and alternatives were discussed in detail with the patient.  He understood and accepted the risks and agreed to proceed.  OPERATIVE NOTE:  Mr. Heiny was brought to the preoperative holding area on November 09, 2016.  Anesthesia placed an arterial blood pressure monitoring line and a central venous line.  He was taken to  the operating room, anesthetized, and intubated with a double-lumen endotracheal tube.  Intravenous antibiotics were administered. Sequential compression devices were placed on the calves for DVT prophylaxis.  A Foley catheter was placed.  He was placed in a left lateral decubitus position and the right chest was prepped and draped in usual sterile fashion.  Single lung ventilation of the left lung was initiated and it was tolerated well throughout the procedure.  After performing a time-out, an incision was made in the seventh interspace in the midaxillary line.  A 5-mm port was inserted into the chest.  The thoracoscope was advanced to the chest.  There was good isolation of the right lung, but it was relatively slow to deflate. With suction applied, the right lung did deflate nicely.  A 4-cm working incision was made in the fourth interspace anterolaterally.  No rib spreading was performed during the procedure.  The anterior portion of the lower lobe was examined along the fissure.  The nodule was present. There was some invagination of the visceral pleura.  The nodule was easily palpable.  A wedge resection was performed with sequential firings of a 60-mm Echelon stapler using gold cartridges.  The specimen was placed into an endoscopic retrieval  bag, removed and sent for frozen section on the nodule.  While awaiting the results of the nodule, an On- Q local anesthetic catheter was tunneled through a separate incision posteriorly.  It was tunneled into a subpleural location and primed with 5 mL of 0.5% Marcaine.  It was secured to skin with a 3-0 silk suture. The inferior ligament was divided.  A relatively normal-appearing level 9 node was identified.  This was removed and sent for permanent pathology as were all lymph nodes that were encountered during dissection.  The pleural reflection was divided at the hilum posteriorly and level 7 nodes were removed and sent for pathology.   The pleura reflection was also divided at the hilum anteriorly.  The minor fissure was relatively complete and there was fatty tissue within the fissure. Dissection was begun in this area and a level 11 node at the bifurcation of the lower and middle lobe bronchi was removed.  By this point, the frozen section had returned showing adenocarcinoma.  Decision was made to proceed with an anterior segmentectomy that was discussed with the patient preoperatively.  The main branch of the pulmonary artery to the lower lobe was identified.  The dissection was carried out onto the anterior-most branch.  This was encircled.  Level 12 node was removed and sent for pathology.  The artery branch was divided with the vascular stapler.  The bronchus then was present immediately beyond that and it was also dissected out.  Level 13 node was removed.  The bronchus was encircled.  An Echelon stapler with a green cartridge was placed across the bronchus and closed.  A test inflation showed good aeration of the upper and middle lobes as well as remainder of the lower lobe.  The stapler was fired transecting the bronchus.  The segmental anatomy was apparent due to the anterior segment being under-inflated relative to the rest of the lobe.  Segmentectomy was completed with repeated firings of the Echelon stapler again using gold cartridges.  The old staple line was marked with a suture for orientation purposes.  The specimen was removed.  Inspection of the bronchial stump revealed that it was relatively long.  So, this was dissected out and re-stapled, flush with its takeoff from the right lower lobe bronchus.  The small segment of remaining bronchus that was removed was sent for frozen section for the final bronchial margin and that returned with no tumor seen.  The pleural reflection was divided more superiorly at the hilum and then the pleura was also incised superior to the azygos vein.  A 4R node  was identified and it was removed and sent for permanent pathology.  The chest was copiously irrigated with warm saline.  A test inflation showed some air leakage from the fissure.  Progel was applied to this area.  A 28-French chest tube was placed through the original port incision and directed to the apex.  Dual-lung ventilation was resumed.  The working incision was closed with a #1 Vicryl fascial suture.  The subcutaneous tissue and skin were closed in a standard fashion.  All sponge, needle, and instrument counts were correct at the end of the procedure.  The patient was taken from the operating room to the postanesthetic care unit extubated and in good condition.     Revonda Standard Roxan Hockey, M.D.     SCH/MEDQ  D:  11/09/2017  T:  11/09/2017  Job:  [id number]

## 2017-11-10 ENCOUNTER — Encounter (HOSPITAL_COMMUNITY): Payer: Self-pay | Admitting: Thoracic Surgery (Cardiothoracic Vascular Surgery)

## 2017-11-10 ENCOUNTER — Inpatient Hospital Stay (HOSPITAL_COMMUNITY): Payer: Medicare Other

## 2017-11-10 LAB — BLOOD GAS, ARTERIAL
ACID-BASE EXCESS: 2.9 mmol/L — AB (ref 0.0–2.0)
BICARBONATE: 27 mmol/L (ref 20.0–28.0)
Drawn by: 24487
O2 CONTENT: 2 L/min
O2 Saturation: 95.1 %
PATIENT TEMPERATURE: 98.5
PO2 ART: 72 mmHg — AB (ref 83.0–108.0)
pCO2 arterial: 41.7 mmHg (ref 32.0–48.0)
pH, Arterial: 7.427 (ref 7.350–7.450)

## 2017-11-10 LAB — CBC
HEMATOCRIT: 39.6 % (ref 39.0–52.0)
Hemoglobin: 13.6 g/dL (ref 13.0–17.0)
MCH: 32.2 pg (ref 26.0–34.0)
MCHC: 34.3 g/dL (ref 30.0–36.0)
MCV: 93.6 fL (ref 78.0–100.0)
PLATELETS: 140 10*3/uL — AB (ref 150–400)
RBC: 4.23 MIL/uL (ref 4.22–5.81)
RDW: 13.3 % (ref 11.5–15.5)
WBC: 14.1 10*3/uL — AB (ref 4.0–10.5)

## 2017-11-10 LAB — BASIC METABOLIC PANEL
Anion gap: 9 (ref 5–15)
BUN: 12 mg/dL (ref 6–20)
CO2: 25 mmol/L (ref 22–32)
CREATININE: 0.94 mg/dL (ref 0.61–1.24)
Calcium: 8.1 mg/dL — ABNORMAL LOW (ref 8.9–10.3)
Chloride: 102 mmol/L (ref 101–111)
GFR calc Af Amer: >60 mL/min (ref >60)
GLUCOSE: 129 mg/dL — AB (ref 65–99)
POTASSIUM: 3.6 mmol/L (ref 3.5–5.1)
Sodium: 136 mmol/L (ref 135–145)

## 2017-11-10 LAB — GLUCOSE, CAPILLARY
GLUCOSE-CAPILLARY: 121 mg/dL — AB (ref 65–99)
Glucose-Capillary: 150 mg/dL — ABNORMAL HIGH (ref 65–99)
Glucose-Capillary: 130 mg/dL — ABNORMAL HIGH (ref 65–99)

## 2017-11-10 MED ORDER — POTASSIUM CHLORIDE CRYS ER 20 MEQ PO TBCR
30.0000 meq | EXTENDED_RELEASE_TABLET | Freq: Once | ORAL | Status: AC
  Administered 2017-11-10: 30 meq via ORAL
  Filled 2017-11-10: qty 1

## 2017-11-10 MED ORDER — ENOXAPARIN SODIUM 40 MG/0.4ML ~~LOC~~ SOLN
40.0000 mg | SUBCUTANEOUS | Status: DC
  Administered 2017-11-10 – 2017-11-13 (×4): 40 mg via SUBCUTANEOUS
  Filled 2017-11-10 (×4): qty 0.4

## 2017-11-10 MED ORDER — LUNG SURGERY BOOK
Freq: Once | Status: AC
  Administered 2017-11-10: 12:00:00
  Filled 2017-11-10: qty 1

## 2017-11-10 NOTE — Plan of Care (Signed)
Continue current care plan 

## 2017-11-10 NOTE — Discharge Instructions (Signed)
Thoracoscopy, Care After °Refer to this sheet in the next few weeks. These instructions provide you with information about caring for yourself after your procedure. Your health care provider may also give you more specific instructions. Your treatment has been planned according to current medical practices, but problems sometimes occur. Call your health care provider if you have any problems or questions after your procedure. °What can I expect after the procedure? °After your procedure, it is common to feel sore for up to two weeks. °Follow these instructions at home: °· There are many different ways to close and cover an incision, including stitches (sutures), skin glue, and adhesive strips. Follow your health care provider's instructions about: °? Incision care. °? Bandage (dressing) changes and removal. °? Incision closure removal. °· Check your incision area every day for signs of infection. Watch for: °? Redness, swelling, or pain. °? Fluid, blood, or pus. °· Take medicines only as directed by your health care provider. °· Try to cough often. Coughing helps to protect against lung infection (pneumonia). It may hurt to cough. If this happens, hold a pillow against your chest when you cough. °· Take deep breaths. This also helps to protect against pneumonia. °· If you were given an incentive spirometer, use it as directed by your health care provider. °· Do not take baths, swim, or use a hot tub until your health care provider approves. You may take showers. °· Avoid lifting until your health care provider approves. °· Avoid driving until your health care provider approves. °· Do not travel by airplane after the chest tube is removed until your health care provider approves. °Contact a health care provider if: °· You have a fever. °· Pain medicines do not ease your pain. °· You have redness, swelling, or increasing pain in your incision area. °· You develop a cough that does not go away, or you are coughing up  mucus that is yellow or green. °Get help right away if: °· You have fluid, blood, or pus coming from your incision. °· There is a bad smell coming from your incision or dressing. °· You develop a rash. °· You have difficulty breathing. °· You cough up blood. °· You develop light-headedness or you feel faint. °· You develop chest pain. °· Your heartbeat feels irregular or very fast. °This information is not intended to replace advice given to you by your health care provider. Make sure you discuss any questions you have with your health care provider. °Document Released: 04/01/2005 Document Revised: 05/15/2016 Document Reviewed: 05/28/2014 °Elsevier Interactive Patient Education © 2018 [employer] ° °

## 2017-11-10 NOTE — Progress Notes (Addendum)
      [address]       [place] [address]             [phone]       1 Day Post-Op Procedure(s) (LRB): RIGHT VIDEO ASSISTED THORACOSCOPY WITH /WEDGE RESECTION (Right)  Subjective: Patient states right chest wall dressing changed last night. He has a few questions about the surgery-I will defer to Dr. Roxan Hockey as I did not assist. Patient without specific complaints this am.  Objective: Vital signs in last 24 hours: Temp:  [97 F (36.1 C)-98.5 F (36.9 C)] 98.3 F (36.8 C) (02/15 0713) Pulse Rate:  [37-105] 54 (02/15 0713) Cardiac Rhythm: Normal sinus rhythm;Heart block (02/15 0528) Resp:  [10-21] 17 (02/15 0713) BP: (108-138)/(59-102) 133/78 (02/15 0713) SpO2:  [93 %-98 %] 96 % (02/15 0713) Arterial Line BP: (115-166)/(55-110) 138/55 (02/15 0400) FiO2 (%):  [30 %-33 %] 30 % (02/15 0400) Weight:  [196 lb 3.4 oz (89 kg)] 196 lb 3.4 oz (89 kg) (02/14 1854)      Intake/Output from previous day: 02/14 0701 - 02/15 0700 In: 3630 [P.O.:370; I.V.:3160; IV Piggyback:100] Out: 4235 [TIRWE:3154; Blood:75; Chest Tube:260]   Physical Exam:  Cardiovascular: RRR Pulmonary: Clear to auscultation on left and coarse on the right Abdomen: Soft, non tender, bowel sounds present. Extremities: Trace bilateral lower extremity edema. Wounds: Dressing is clean and dry.   Chest Tube:to suction, no air leak  Lab Results: CBC: Recent Labs    11/07/17 1232 11/10/17 0435  WBC 7.5 14.1*  HGB 15.8 13.6  HCT 46.1 39.6  PLT 149* 140*   BMET:  Recent Labs    11/07/17 1232 11/10/17 0435  NA 138 136  K 4.0 3.6  CL 107 102  CO2 21* 25  GLUCOSE 86 129*  BUN 12 12  CREATININE 0.79 0.94  CALCIUM 8.8* 8.1*    PT/INR:  Recent Labs    11/07/17 1232  LABPROT 13.4  INR 1.03   ABG:  INR: Will add last result for INR, ABG once components are confirmed Will add last 4 CBG results once components are confirmed  Assessment/Plan:  1. CV - SB in the 50's. On  Plavix 75 mg at night. 2.  Pulmonary - On 2 liters of oxygen via [place]. Chest tube with 260 cc of output since surgery yesterday. Chest tube is to suction. There is no air leak.  CXR this am shows patient rotated to the left, questionable small right basilar pneumothorax, LLL atelectasis. Hope to place chest tube to water seal. Await final pathology. 3. Remove a line and foley 4. Decrease IVF 5. CBGs 150/130/121. No prior history of DM so will stop accu checks and SS PRN. 6. Supplement potassium 7. History of thrombocytopenia-platelets 140,000 8. Likely home Sunday  Sharalyn Ink ZimmermanPA-C 11/10/2017,7:45 AM   Patient seen and examined, agree with above No air leak- CT to water seal If no leak- dc tube tomorrow  Remo Lipps C. Roxan Hockey, MD [facility] [phone]

## 2017-11-10 NOTE — Discharge Summary (Signed)
Physician Discharge Summary       [address]       [place] [address]             [phone]    Patient ID: Louis Barton MRN: [id number] DOB/AGE: October 23, 1940 77 y.o.  Admit date: 11/09/2017 Discharge date: 11/13/2017  Admission Diagnosis: Right lower lobe pulmonary nodule  Discharge Diagnoses:  1. Adenocarcinoma, right lower lobe, clinical stage IA. 2. S/p right VATS, wedge RLL nodule, RLL anterior segmentectomy  3. History of tobacco abuse 4. History of CAD (coronary artery disease) 5. History of thrombocytopenia 6. History of TIAs 7. History of hypertension 8. History of hyperlipidemia 9. History of stroke ([place]) 10. History of PAD (peripheral artery disease) ([place]) 11. History of carotid stenosis   Procedure (s):  Right video-assisted thoracoscopy, wedge resection of right lower lobe nodule, right lower lobe anterior segmentectomy, mediastinal lymph node dissection, On-Q local anesthetic catheter placement by Dr. Skipper Cliche on 11/09/2017.  Pathology: Final results pending  History of Presenting Illness: Louis Barton is a 77 year old man with a history of remote tobacco use, quit at age 16.  His past medical history is significant for coronary artery disease, carotid artery disease, TIA, stroke, carotid endarterectomy, peripheral arterial disease, hyperlipidemia, hypertension, and thrombocytopenia.  Last January he was having pain in the left side of his chest.  A CT of the chest showed no cause for the pain but did show a small right lower lobe lung nodule.  He had a follow-up exam in May with a PET/CT.  The SUV was 1.7 and the nodule was not appreciably changed in size.  He recently had a another CT for follow-up of that and the nodule had increased in size from 7 x 10 mm to 10 x 13 mm.  There is no mediastinal or hilar adenopathy.  He has recently had a cough.  It has been productive of yellowish mucus.  He has not had any fevers or chills.  He denies any  wheezing or shortness of breath.  He denies any chest pain, pressure, or tightness.  He has a good appetite and denies any weight loss.  Dr. Roxan Hockey recommended to Louis Barton that we proceed with right VATS for wedge resection and possible segmentectomy if the nodule turned out to be cancerous.  Given its relatively small size, slow growth, and peripheral location, I think a segmentectomy would be sufficient.  Dr. Roxan Hockey described the proposed operation to them in detail.  He understands the need for general anesthesia, the incisions to be used, the use of a drainage tube postoperatively, the expected hospital stay, and the overall recovery.  Dr. Roxan Hockey informed him of the indications, risks, benefits, and alternatives.  Patient agreed to proceed with surgery. He underwent a Right video-assisted thoracoscopy, wedge resection of right lower lobe nodule, right lower lobe anterior segmentectomy, mediastinal lymph node dissection, On-Q local anesthetic catheter placement on 11/09/2017.  Brief Hospital Course:  The patient remained afebrile and hemodynamically stable. A line and foley were removed early in the post operative course. Chest tube output gradually decreased. Daily chest x rays were obtained and remained stable. Chest tube was removed on 11/12/2017. Patient is ambulating on room air. Patient is toleratinga diet and has had a bowel movement. Wounds are clean and dry. Final chest X ray showed stable appearance of trace pneumothorax . Patient is felt surgically stable for discharge today.   Latest Vital Signs: Blood pressure (!) 143/73, pulse 60, temperature 99.1 F (  37.3 C), temperature source Oral, resp. rate 20, height 5\' 8"  (1.727 m), weight 196 lb 3.4 oz (89 kg), SpO2 98 %.  Physical Exam: Cardiovascular: RRR Pulmonary: Clear to auscultation on left and coarse on the right Abdomen: Soft, non tender, bowel sounds present. Extremities: Trace bilateral lower extremity  edema. Wounds: Dressing is clean and dry.    Discharge Condition: Stable and discharged to home.  Recent laboratory studies:  Lab Results  Component Value Date   WBC 12.2 (H) 11/11/2017   HGB 14.0 11/11/2017   HCT 41.6 11/11/2017   MCV 94.8 11/11/2017   PLT 123 (L) 11/11/2017   Lab Results  Component Value Date   NA 139 11/11/2017   K 3.6 11/11/2017   CL 105 11/11/2017   CO2 22 11/11/2017   CREATININE 0.87 11/11/2017   GLUCOSE 110 (H) 11/11/2017      Diagnostic Studies: Dg Chest 2 View  Result Date: 11/13/2017 CLINICAL DATA:  Recent pneumothorax with chest tube removal EXAM: CHEST  2 VIEW COMPARISON:  November 12, 2017 FINDINGS: Right-sided chest tube is been removed. Minimal right apical pneumothorax is stable. No tension component. There is subcutaneous air on the right, noted previously. There is a small right pleural effusion with atelectatic change in the right lower lobe region. There is also atelectatic change with small pleural effusion in the left base. No frank consolidation noted. Heart size is upper normal with pulmonary vascularity within normal limits. There is aortic atherosclerosis. No adenopathy. No evident bone lesions. IMPRESSION: Persistent trace pneumothorax following chest tube removal. Subcutaneous air present on the right. There is bibasilar atelectasis with small pleural effusions bilaterally. Stable cardiac silhouette. There is aortic atherosclerosis. Aortic Atherosclerosis (ICD10-I70.0). Electronically Signed   By: Lowella Grip III M.D.   On: 11/13/2017 07:34   Dg Chest 2 View  Result Date: 11/07/2017 CLINICAL DATA:  Preoperative exam for right lung surgery. EXAM: CHEST  2 VIEW COMPARISON:  CT 10/09/2017. FINDINGS: Mild anterior mediastinal fullness most likely secondary to prominent mediastinal fat as noted on recent CT. Hilar structures are normal. Right lung base subsegmental atelectasis. Right lung base nodule best identified by prior CT. No pleural  effusion or pneumothorax. Heart size stable. No acute bony abnormality. Surgical clips noted over the neck. IMPRESSION: 1.  Right base subsegmental atelectasis. 2.  Right lung nodule best identified by prior CT Electronically Signed   By: Marcello Moores  Register   On: 11/07/2017 13:18   Dg Chest Port 1 View  Result Date: 11/12/2017 CLINICAL DATA:  Chest tube EXAM: PORTABLE CHEST 1 VIEW COMPARISON:  11/11/2017 FINDINGS: 0520 hours. Right chest tube remains in place with tiny apical right pneumothorax visible on today's exam right IJ central line has been removed in the interval. There is persistent basilar scarring/atelectasis bilaterally subcutaneous emphysema right chest wall is similar. Telemetry leads overlie the chest. IMPRESSION: Right chest tube remains in place with trace apical right pneumothorax. Electronically Signed   By: Misty Stanley M.D.   On: 11/12/2017 08:16   Dg Chest Port 1 View  Result Date: 11/11/2017 CLINICAL DATA:  Right-sided pneumothorax. EXAM: PORTABLE CHEST 1 VIEW COMPARISON:  11/10/2017 FINDINGS: Right IJ central venous catheter unchanged. Right-sided chest tube has pulled back slightly with minimal new subcutaneous emphysema over the right flank. Lungs are adequately inflated with persistent opacification in the right base with multiple surgical suture lines over the right base as findings likely due to atelectasis and less likely infection. Left lung is clear. No definite evidence of  right-sided pneumothorax. Cardiomediastinal silhouette is within normal. Remainder of the exam is unchanged. IMPRESSION: Postsurgical changes right base with minimal stable opacification in the right base which may be due to atelectasis or infection. Right-sided chest tube has been pulled back slightly with minimal new subcutaneous emphysema over the right flank. No definite right pneumothorax. Right IJ central venous catheter unchanged. These results were called by telephone at the time of interpretation  on 11/11/2017 at 8:29 am to patient's nurse, Fabienne Bruns, who verbally acknowledged these results. Electronically Signed   By: Marin Olp M.D.   On: 11/11/2017 08:29   Dg Chest Port 1 View  Result Date: 11/10/2017 CLINICAL DATA:  Postop lung surgery EXAM: PORTABLE CHEST 1 VIEW COMPARISON:  11/09/2017 FINDINGS: Right chest tube remains in good position. Possible small pneumothorax in the right lung base. No pneumothorax in the apex. Central venous catheter tip in the SVC unchanged No significant change right lower lobe airspace disease. Mild improvement in left lower lobe atelectasis and small effusion. IMPRESSION: Right chest tube remains in place. Possible small pneumothorax right lung base. No change in right lower lobe airspace disease Mild improvement in left lower lobe atelectasis and effusion. Electronically Signed   By: Franchot Gallo M.D.   On: 11/10/2017 06:43   Dg Chest Port 1 View  Result Date: 11/09/2017 CLINICAL DATA:  Status post lung surgery in central line placement EXAM: PORTABLE CHEST 1 VIEW COMPARISON:  11/07/2017 FINDINGS: RIGHT chest tube with tip in superior pleural space. No pneumothorax. RIGHT central venous line with tip in the distal SVC. LEFT basilar atelectasis and mild RIGHT basilar atelectasis. IMPRESSION: 1. No pneumothorax. 2. RIGHT chest tube and central venous line appear in good position. Electronically Signed   By: Suzy Bouchard M.D.   On: 11/09/2017 11:25    Discharge Medications: Allergies as of 11/13/2017   No Known Allergies     Medication List    TAKE these medications   acetaminophen 500 MG tablet Commonly known as:  TYLENOL Take 1,000 mg by mouth every 6 (six) hours as needed (for pain.).   aspirin EC 81 MG tablet Take 81 mg by mouth daily.   clonazePAM 0.5 MG tablet Commonly known as:  KLONOPIN Take 0.25 mg by mouth at bedtime as needed (for anxiety/sleep.).   clopidogrel 75 MG tablet Commonly known as:  PLAVIX Take 75 mg by mouth at  bedtime.   colesevelam 625 MG tablet Commonly known as:  WELCHOL Take 1,250 mg by mouth 2 (two) times daily with a meal.   hydrochlorothiazide 25 MG tablet Commonly known as:  HYDRODIURIL Take 25 mg by mouth daily.   losartan 100 MG tablet Commonly known as:  COZAAR Take 100 mg by mouth daily.   rosuvastatin 40 MG tablet Commonly known as:  CRESTOR Take 1 tablet (40 mg total) by mouth daily. What changed:  when to take this   SYSTANE ULTRA 0.4-0.3 % Soln Generic drug:  Polyethyl Glycol-Propyl Glycol Place 1 drop into both eyes daily.   traMADol 50 MG tablet Commonly known as:  ULTRAM Take 1 tablet (50 mg total) by mouth every 6 (six) hours as needed (mild pain).   valACYclovir 1000 MG tablet Commonly known as:  VALTREX Take 1,000 mg by mouth 2 (two) times daily as needed (for fever blisters.).   VITAMIN B-12 PO Place 1 drop under the tongue daily. 1 dropper daily in the morning.       Follow Up Appointments: Follow-up Information  [name], MD. Go on 11/10/2017.   Specialty:  Cardiothoracic Surgery Why:  PA/LAT CXR to be taken (at [facility] which is in the same building as Dr. Leonarda Salon office) on at;Appointment time is at  Contact information: [address] [place] [address] [phone]           Signed: Ellamae Sia 11/13/2017, 8:06 AM

## 2017-11-11 ENCOUNTER — Inpatient Hospital Stay (HOSPITAL_COMMUNITY): Payer: Medicare Other

## 2017-11-11 LAB — COMPREHENSIVE METABOLIC PANEL
ALT: 16 U/L — ABNORMAL LOW (ref 17–63)
AST: 28 U/L (ref 15–41)
Albumin: 3.1 g/dL — ABNORMAL LOW (ref 3.5–5.0)
Alkaline Phosphatase: 39 U/L (ref 38–126)
Anion gap: 12 (ref 5–15)
BUN: 11 mg/dL (ref 6–20)
CHLORIDE: 105 mmol/L (ref 101–111)
CO2: 22 mmol/L (ref 22–32)
Calcium: 8.3 mg/dL — ABNORMAL LOW (ref 8.9–10.3)
Creatinine, Ser: 0.87 mg/dL (ref 0.61–1.24)
GFR calc Af Amer: >60 mL/min (ref >60)
GLUCOSE: 110 mg/dL — AB (ref 65–99)
POTASSIUM: 3.6 mmol/L (ref 3.5–5.1)
SODIUM: 139 mmol/L (ref 135–145)
Total Bilirubin: 1.3 mg/dL — ABNORMAL HIGH (ref 0.3–1.2)
Total Protein: 5.9 g/dL — ABNORMAL LOW (ref 6.5–8.1)

## 2017-11-11 LAB — CBC
HCT: 41.6 % (ref 39.0–52.0)
Hemoglobin: 14 g/dL (ref 13.0–17.0)
MCH: 31.9 pg (ref 26.0–34.0)
MCHC: 33.7 g/dL (ref 30.0–36.0)
MCV: 94.8 fL (ref 78.0–100.0)
PLATELETS: 123 10*3/uL — AB (ref 150–400)
RBC: 4.39 MIL/uL (ref 4.22–5.81)
RDW: 13.6 % (ref 11.5–15.5)
WBC: 12.2 10*3/uL — AB (ref 4.0–10.5)

## 2017-11-11 MED ORDER — POLYETHYLENE GLYCOL 3350 17 G PO PACK
17.0000 g | PACK | Freq: Every day | ORAL | Status: DC
  Administered 2017-11-11 – 2017-11-13 (×3): 17 g via ORAL
  Filled 2017-11-11 (×3): qty 1

## 2017-11-11 MED ORDER — OXYCODONE HCL 5 MG PO TABS: 5.0000 mg | ORAL_TABLET | Freq: Four times a day (QID) | ORAL | Status: DC | PRN

## 2017-11-11 NOTE — Progress Notes (Addendum)
[address]       ,[place] [address]             [phone]      2 Days Post-Op Procedure(s) (LRB): RIGHT VIDEO ASSISTED THORACOSCOPY WITH /WEDGE RESECTION (Right) Subjective: Nursing reports he's been confused, some hallucinations  Objective: Vital signs in last 24 hours: Temp:  [97.9 F (36.6 C)-98.8 F (37.1 C)] 98.8 F (37.1 C) (02/16 0746) Pulse Rate:  [56-105] 67 (02/16 0746) Cardiac Rhythm: Normal sinus rhythm;Heart block (02/16 0400) Resp:  [16-25] 22 (02/16 0910) BP: (115-157)/(69-83) 125/83 (02/16 0746) SpO2:  [93 %-98 %] 93 % (02/16 0910)  Hemodynamic parameters for last 24 hours:    Intake/Output from previous day: 02/15 0701 - 02/16 0700 In: 1353.5 [P.O.:960; I.V.:393.5] Out: 1625 [Urine:1425; Chest Tube:200] Intake/Output this shift: No intake/output data recorded.  General appearance: alert, cooperative and no distress Heart: irregularly irregular rhythm Lungs: dim right base, + sub q air Abdomen: benign Extremities: no edema Wound: incis healing well  Lab Results: Recent Labs    11/10/17 0435 11/11/17 0500  WBC 14.1* 12.2*  HGB 13.6 14.0  HCT 39.6 41.6  PLT 140* 123*   BMET:  Recent Labs    11/10/17 0435 11/11/17 0500  NA 136 139  K 3.6 3.6  CL 102 105  CO2 25 22  GLUCOSE 129* 110*  BUN 12 11  CREATININE 0.94 0.87  CALCIUM 8.1* 8.3*    PT/INR: No results for input(s): LABPROT, INR in the last 72 hours. ABG    Component Value Date/Time   PHART 7.427 11/10/2017 0330   HCO3 27.0 11/10/2017 0330   O2SAT 95.1 11/10/2017 0330   CBG (last 3)  Recent Labs    11/10/17 0011 11/10/17 0359 11/10/17 0724  GLUCAP 150* 130* 121*    Meds Scheduled Meds: . acetaminophen  1,000 mg Oral Q6H   Or  . acetaminophen (TYLENOL) oral liquid 160 mg/5 mL  1,000 mg Oral Q6H  . aspirin EC  81 mg Oral Daily  . bisacodyl  10 mg Oral Daily  . clopidogrel  75 mg Oral QHS  . colesevelam  1,250 mg Oral BID WC  .  enoxaparin (LOVENOX) injection  40 mg Subcutaneous Q24H  . polyethylene glycol  17 g Oral Daily  . rosuvastatin  40 mg Oral QHS  . senna-docusate  1 tablet Oral QHS   Continuous Infusions: . bupivacaine ON-Q pain pump    . dextrose 5 % and 0.45% NaCl 10 mL/hr at 11/10/17 0849  . potassium chloride 10 mEq (11/11/17 0903)   PRN Meds:.clonazepam, diphenhydrAMINE **OR** diphenhydrAMINE, naloxone **AND** sodium chloride flush, ondansetron (ZOFRAN) IV, oxyCODONE, potassium chloride, traMADol  Xrays Dg Chest Port 1 View  Result Date: 11/11/2017 CLINICAL DATA:  Right-sided pneumothorax. EXAM: PORTABLE CHEST 1 VIEW COMPARISON:  11/10/2017 FINDINGS: Right IJ central venous catheter unchanged. Right-sided chest tube has pulled back slightly with minimal new subcutaneous emphysema over the right flank. Lungs are adequately inflated with persistent opacification in the right base with multiple surgical suture lines over the right base as findings likely due to atelectasis and less likely infection. Left lung is clear. No definite evidence of right-sided pneumothorax. Cardiomediastinal silhouette is within normal. Remainder of the exam is unchanged. IMPRESSION: Postsurgical changes right base with minimal stable opacification in the right base which may be due to atelectasis or infection. Right-sided chest tube has been pulled back slightly with minimal new subcutaneous emphysema over the right flank.  No definite right pneumothorax. Right IJ central venous catheter unchanged. These results were called by telephone at the time of interpretation on 11/11/2017 at 8:29 am to patient's nurse, Fabienne Bruns, who verbally acknowledged these results. Electronically Signed   By: Marin Olp M.D.   On: 11/11/2017 08:29   Dg Chest Port 1 View  Result Date: 11/10/2017 CLINICAL DATA:  Postop lung surgery EXAM: PORTABLE CHEST 1 VIEW COMPARISON:  11/09/2017 FINDINGS: Right chest tube remains in good position. Possible small  pneumothorax in the right lung base. No pneumothorax in the apex. Central venous catheter tip in the SVC unchanged No significant change right lower lobe airspace disease. Mild improvement in left lower lobe atelectasis and small effusion. IMPRESSION: Right chest tube remains in place. Possible small pneumothorax right lung base. No change in right lower lobe airspace disease Mild improvement in left lower lobe atelectasis and effusion. Electronically Signed   By: Franchot Gallo M.D.   On: 11/10/2017 06:43   Dg Chest Port 1 View  Result Date: 11/09/2017 CLINICAL DATA:  Status post lung surgery in central line placement EXAM: PORTABLE CHEST 1 VIEW COMPARISON:  11/07/2017 FINDINGS: RIGHT chest tube with tip in superior pleural space. No pneumothorax. RIGHT central venous line with tip in the distal SVC. LEFT basilar atelectasis and mild RIGHT basilar atelectasis. IMPRESSION: 1. No pneumothorax. 2. RIGHT chest tube and central venous line appear in good position. Electronically Signed   By: Suzy Bouchard M.D.   On: 11/09/2017 11:25    Assessment/Plan: S/P Procedure(s) (LRB): RIGHT VIDEO ASSISTED THORACOSCOPY WITH /WEDGE RESECTION (Right)  1 confusion- currently lucid and recognizes he has been confused- will d/c pca 2 sinus with PAC's and 1 deg HB, does not appear to be new 3 mod drainage from tube yesterday 320 cc, new SQ air- leave in place for now 4 labs stable, leukocytosis improving. Platelets down a little to 123K 5 no fevers 6 cont pum toilet/rehab  LOS: 2 days    Louis Barton 11/11/2017 remove chest tube in a.m. if no change in x-ray patient examined and medical record reviewed,agree with above note. Tharon Aquas Trigt III 11/11/2017

## 2017-11-12 ENCOUNTER — Inpatient Hospital Stay (HOSPITAL_COMMUNITY): Payer: Medicare Other

## 2017-11-12 LAB — GLUCOSE, CAPILLARY: GLUCOSE-CAPILLARY: 103 mg/dL — AB (ref 65–99)

## 2017-11-12 NOTE — Progress Notes (Signed)
3 Days Post-Op Procedure(s) (LRB): RIGHT VIDEO ASSISTED THORACOSCOPY WITH /WEDGE RESECTION (Right) Subjective: No air leak- min output DC tube Home in am  Objective: Vital signs in last 24 hours: Temp:  [97.8 F (36.6 C)-98.6 F (37 C)] 98.4 F (36.9 C) (02/17 1113) Pulse Rate:  [55-112] 55 (02/17 1113) Cardiac Rhythm: Normal sinus rhythm (02/17 0721) Resp:  [17-21] 20 (02/17 1113) BP: (126-166)/(71-86) 148/79 (02/17 1113) SpO2:  [94 %-99 %] 99 % (02/17 1113)  Hemodynamic parameters for last 24 hours:  stable of O2  Intake/Output from previous day: 02/16 0701 - 02/17 0700 In: 1110 [P.O.:720; I.V.:340; IV Piggyback:50] Out: 350 [Urine:225; Chest Tube:125] Intake/Output this shift: Total I/O In: 300 [P.O.:300] Out: 330 [Urine:200; Chest Tube:130]       Exam    General- alert and comfortable    Neck- no JVD, no cervical adenopathy palpable, no carotid bruit   Lungs- clear without rales, wheezes   Cor- regular rate and rhythm, no murmur , gallop   Abdomen- soft, non-tender   Extremities - warm, non-tender, minimal edema   Neuro- oriented, appropriate, no focal weakness   Lab Results: Recent Labs    11/10/17 0435 11/11/17 0500  WBC 14.1* 12.2*  HGB 13.6 14.0  HCT 39.6 41.6  PLT 140* 123*   BMET:  Recent Labs    11/10/17 0435 11/11/17 0500  NA 136 139  K 3.6 3.6  CL 102 105  CO2 25 22  GLUCOSE 129* 110*  BUN 12 11  CREATININE 0.94 0.87  CALCIUM 8.1* 8.3*    PT/INR: No results for input(s): LABPROT, INR in the last 72 hours. ABG    Component Value Date/Time   PHART 7.427 11/10/2017 0330   HCO3 27.0 11/10/2017 0330   O2SAT 95.1 11/10/2017 0330   CBG (last 3)  Recent Labs    11/10/17 0011 11/10/17 0359 11/10/17 0724  GLUCAP 150* 130* 121*    Assessment/Plan: S/P Procedure(s) (LRB): RIGHT VIDEO ASSISTED THORACOSCOPY WITH /WEDGE RESECTION (Right) DC chest tube today Home in am mon   LOS: 3 days    Louis Barton 11/12/2017

## 2017-11-13 ENCOUNTER — Inpatient Hospital Stay (HOSPITAL_COMMUNITY): Payer: Medicare Other

## 2017-11-13 MED ORDER — TRAMADOL HCL 50 MG PO TABS: 50.0000 mg | ORAL_TABLET | Freq: Four times a day (QID) | ORAL | 0 refills | Status: DC | PRN

## 2017-11-13 NOTE — Progress Notes (Signed)
4 Days Post-Op Procedure(s) (LRB): RIGHT VIDEO ASSISTED THORACOSCOPY WITH /WEDGE RESECTION (Right) Subjective: No complaints  Objective: Vital signs in last 24 hours: Temp:  [98 F (36.7 C)-99.3 F (37.4 C)] 98.6 F (37 C) (02/18 0416) Pulse Rate:  [32-109] 32 (02/18 0416) Cardiac Rhythm: Normal sinus rhythm (02/18 0418) Resp:  [16-24] 19 (02/18 0416) BP: (130-148)/(74-82) 145/77 (02/18 0416) SpO2:  [96 %-99 %] 96 % (02/18 0416)  Hemodynamic parameters for last 24 hours:    Intake/Output from previous day: 02/17 0701 - 02/18 0700 In: 1100 [P.O.:1100] Out: 330 [Urine:200; Chest Tube:130] Intake/Output this shift: No intake/output data recorded.  General appearance: alert, cooperative and no distress Neurologic: intact Heart: regular rate and rhythm Lungs: clear to auscultation bilaterally Abdomen: normal findings: soft, non-tender Wound: clean and dry  Lab Results: Recent Labs    11/11/17 0500  WBC 12.2*  HGB 14.0  HCT 41.6  PLT 123*   BMET:  Recent Labs    11/11/17 0500  NA 139  K 3.6  CL 105  CO2 22  GLUCOSE 110*  BUN 11  CREATININE 0.87  CALCIUM 8.3*    PT/INR: No results for input(s): LABPROT, INR in the last 72 hours. ABG    Component Value Date/Time   PHART 7.427 11/10/2017 0330   HCO3 27.0 11/10/2017 0330   O2SAT 95.1 11/10/2017 0330   CBG (last 3)  No results for input(s): GLUCAP in the last 72 hours.  Assessment/Plan: S/P Procedure(s) (LRB): RIGHT VIDEO ASSISTED THORACOSCOPY WITH /WEDGE RESECTION (Right) Plan for discharge: see discharge orders  Doing well CXR OK Path still pending Home today   LOS: 4 days    [name] 11/13/2017

## 2017-11-13 NOTE — Progress Notes (Signed)
PIV discontinued.  CT site re-dressed; C/D/I for home.  Pt given dressing supplies and instructions re: site care.  AVS reviewed.  RX given to patient.  Patient awaits his family for transport home at 11:30am

## 2017-11-16 ENCOUNTER — Other Ambulatory Visit: Payer: Self-pay | Admitting: *Deleted

## 2017-11-27 ENCOUNTER — Other Ambulatory Visit: Payer: Self-pay | Admitting: Thoracic Surgery (Cardiothoracic Vascular Surgery)

## 2017-11-27 DIAGNOSIS — R911 Solitary pulmonary nodule: Secondary | ICD-10-CM

## 2017-11-28 ENCOUNTER — Ambulatory Visit
Admission: RE | Admit: 2017-11-28 | Discharge: 2017-11-28 | Disposition: A | Discharge status: Home / Self Care | Payer: Medicare Other | Source: Ambulatory Visit | Attending: Thoracic Surgery (Cardiothoracic Vascular Surgery) | Admitting: Thoracic Surgery (Cardiothoracic Vascular Surgery)

## 2017-11-28 ENCOUNTER — Ambulatory Visit (INDEPENDENT_AMBULATORY_CARE_PROVIDER_SITE_OTHER): Payer: Self-pay | Admitting: Thoracic Surgery (Cardiothoracic Vascular Surgery)

## 2017-11-28 VITALS — BP 129/80 | HR 65 | Resp 20 | Ht 68.0 in | Wt 186.0 lb

## 2017-11-28 DIAGNOSIS — Z09 Encounter for follow-up examination after completed treatment for conditions other than malignant neoplasm: Secondary | ICD-10-CM

## 2017-11-28 DIAGNOSIS — R911 Solitary pulmonary nodule: Secondary | ICD-10-CM

## 2017-11-28 NOTE — Progress Notes (Signed)
[address]       [place] [address]             [phone]     HPI: Louis Barton returns for scheduled follow-up visit  He is a 77 year old gentleman with a remote history of tobacco abuse (quit 42 years ago).  He does have a history of atherosclerotic cardiovascular disease, hypertension, and hyperlipidemia.  He is being evaluated for pain in the left side of his chest about a year ago.  No source of the pain was identified but his CT did show a small right lower lobe lung nodule.  In May the nodule had not changed in size but had an SUV of 1.7 on PET.  He had a follow-up CT at 6 months and that showed an increase in size.  He was referred for surgical resection.  I did a right VATS and basilar segmentectomy with node dissection on 11/09/2017.  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  He had some vertigo and some confusion postoperatively.  Those resolved and he was discharged on day 4.  He says he feels well.  He has not had any problems with his breathing.  He is not having any pain.  He has begun driving.  He is anxious to go back to the gym.  Past Medical History:  Diagnosis Date  . Arthritis    DDD- aging   . CAD (coronary artery disease)   . 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 Carotid stenosis   . Chronic kidney disease    cysts on both kidneys, seeing [name] in [place], [facility] [place]   . History of hiatal hernia    seen on last scan- as slight   . History of thrombocytopenia   . History of TIAs   . Hyperlipidemia   . Hypertension   . PAD (peripheral artery disease) ([place])   . Shingles    internal- obstruction of bowels & gallbladder  . Stroke [facility])     Current Outpatient Medications  Medication Sig Dispense Refill  . acetaminophen (TYLENOL) 500 MG tablet Take 1,000 mg by mouth every 6 (six) hours as needed (for pain.).    [name] aspirin EC 81 MG tablet Take 81 mg by mouth  daily.    . clonazePAM (KLONOPIN) 0.5 MG tablet Take 0.25 mg by mouth at bedtime as needed (for anxiety/sleep.).     [name] clopidogrel (PLAVIX) 75 MG tablet Take 75 mg by mouth at bedtime.     . colesevelam (WELCHOL) 625 MG tablet Take 1,250 mg by mouth 2 (two) times daily with a meal.     . Cyanocobalamin (VITAMIN B-12 PO) Place 1 drop under the tongue daily. 1 dropper daily in the morning.    . hydrochlorothiazide (HYDRODIURIL) 25 MG tablet Take 25 mg by mouth daily.     [name] losartan (COZAAR) 100 MG tablet Take 100 mg by mouth daily.     Vladimir Faster Glycol-Propyl Glycol (SYSTANE ULTRA) 0.4-0.3 % SOLN Place 1 drop into both eyes daily.    . rosuvastatin (CRESTOR) 40 MG tablet Take 1 tablet (40 mg total) by mouth daily. (Patient taking differently: Take 40 mg by mouth at bedtime. ) 30 tablet 6  . traMADol (ULTRAM) 50 MG tablet Take 1 tablet (50 mg total) by mouth every 6 (six) hours as needed (  mild pain). 30 tablet 0  . valACYclovir (VALTREX) 1000 MG tablet Take 1,000 mg by mouth 2 (two) times daily as needed (for fever blisters.).      No current facility-administered medications for this visit.     Physical Exam BP 129/80   Pulse 65   Resp 20   Ht 5\' 8"  (1.727 m)   Wt 186 lb (84.4 kg)   SpO2 96% Comment: RA  BMI 28.37 kg/m  77 year old man in no acute distress Alert and oriented x3 with no focal deficits Lungs slightly diminished at right base, otherwise clear Incisions well-healed Cardiac regular rate and rhythm  Diagnostic Tests: CHEST  2 VIEW  COMPARISON:  11/13/2017  FINDINGS: Lungs are adequately inflated demonstrate a small right pleural effusion slightly larger. Minimal right basilar opacification likely atelectasis/scarring. Left lung is clear. Cardiomediastinal silhouette and remainder of the exam is unchanged.  IMPRESSION: Small right pleural effusion slightly larger. Mild right base opacification likely atelectasis/scarring.   Electronically Signed   By:  Marin Olp M.D.   On: 11/28/2017 12:37 I personally reviewed the chest x-ray images and concur with the findings noted above  Impression: Louis Barton is a 77 year old man who had a right lower lobe basilar segmentectomy for a stage IA adenocarcinoma (T1, N0) about 3 weeks ago.  He is doing extremely well at this point in time.  There are no restrictions on his activities, but he was advised to build into new activities gradually.  He may drive.  He needs to see an oncologist.  He lives in [place].  He requests to see Dr. Hosie Poisson.  We will see if we can arrange that.  Plan: Referral to Dr. Hosie Poisson oncology  I will plan to see [name] back in about 5 months to check on his progress.  After that if he is doing okay he can just do the remainder of his follow-up in [place], MD [facility] [phone]

## 2017-12-04 DIAGNOSIS — C3431 Malignant neoplasm of lower lobe, right bronchus or lung: Secondary | ICD-10-CM | POA: Diagnosis not present

## 2017-12-04 DIAGNOSIS — Z902 Acquired absence of lung [part of]: Secondary | ICD-10-CM | POA: Diagnosis not present

## 2017-12-04 DIAGNOSIS — N281 Cyst of kidney, acquired: Secondary | ICD-10-CM | POA: Diagnosis not present

## 2017-12-04 DIAGNOSIS — J918 Pleural effusion in other conditions classified elsewhere: Secondary | ICD-10-CM | POA: Diagnosis not present

## 2017-12-05 ENCOUNTER — Telehealth: Payer: Self-pay | Admitting: *Deleted

## 2017-12-08 DIAGNOSIS — C3490 Malignant neoplasm of unspecified part of unspecified bronchus or lung: Secondary | ICD-10-CM | POA: Insufficient documentation

## 2017-12-08 DIAGNOSIS — C3492 Malignant neoplasm of unspecified part of left bronchus or lung: Secondary | ICD-10-CM

## 2017-12-08 HISTORY — DX: Malignant neoplasm of unspecified part of unspecified bronchus or lung: C34.90

## 2017-12-08 HISTORY — DX: Malignant neoplasm of unspecified part of left bronchus or lung: C34.92

## 2017-12-15 DIAGNOSIS — R978 Other abnormal tumor markers: Secondary | ICD-10-CM

## 2017-12-15 HISTORY — DX: Other abnormal tumor markers: R97.8

## 2018-01-25 ENCOUNTER — Telehealth: Payer: Self-pay | Admitting: Internal Medicine

## 2018-01-25 ENCOUNTER — Ambulatory Visit (HOSPITAL_COMMUNITY)
Admission: RE | Admit: 2018-01-25 | Discharge: 2018-01-25 | Disposition: A | Discharge status: Home / Self Care | Payer: Medicare Other | Source: Ambulatory Visit | Attending: Family | Admitting: Family

## 2018-01-25 ENCOUNTER — Ambulatory Visit (INDEPENDENT_AMBULATORY_CARE_PROVIDER_SITE_OTHER): Payer: Medicare Other | Admitting: Family

## 2018-01-25 ENCOUNTER — Encounter: Payer: Self-pay | Admitting: Family

## 2018-01-25 VITALS — BP 102/57 | HR 61 | Temp 98.3°F | Resp 18 | Ht 69.0 in | Wt 187.0 lb

## 2018-01-25 DIAGNOSIS — Z9889 Other specified postprocedural states: Secondary | ICD-10-CM | POA: Diagnosis not present

## 2018-01-25 DIAGNOSIS — Z09 Encounter for follow-up examination after completed treatment for conditions other than malignant neoplasm: Secondary | ICD-10-CM | POA: Diagnosis not present

## 2018-01-25 DIAGNOSIS — I6523 Occlusion and stenosis of bilateral carotid arteries: Secondary | ICD-10-CM | POA: Diagnosis not present

## 2018-01-25 NOTE — Patient Instructions (Signed)

## 2018-01-25 NOTE — Telephone Encounter (Signed)
Went up front to retrieved paperwork that patient dropped off. Handed it directly to [place] so MW can view. Nothing further is needed at this time.

## 2018-01-25 NOTE — Progress Notes (Signed)
Chief Complaint: Follow up Extracranial Carotid Artery Stenosis   History of Present Illness  Louis Barton is a 77 y.o. male who is s/p right CEA on 04/25/2011 and left CEA on 04/29/2004 by Dr. Oneida Alar.  Patient reports a history of TIA in July, 2005, preoperative to the first CEA, as manifested by left amaurosis fugax, right arm weakness, right facial droop; denies expressive aphasia, denies right leg weakness; all symptoms resolved in about 2 minutes.   He has ongoing back problems since age 76, he was evaluated by a neurosurgeon, has had ESI's in the remote past. He has radiculopathy in right anterior thigh at times.  Pt had right lung nodule removed in February 2019 by Dr. Roxan Hockey, was carcinoma per pt. Pt states all lymph node were negative.  He denies dyspnea, states he is having problems with pollen and his sinuses.   He denies claudication symptoms with walking.  Pt states he had a normal stress test in 2016. Was exercising 3-4x/week, he stopped as he is not getting enough sleep due to his wife's sleeping patterns.   He has had vertigo intermittently since about January 2018, mostly when supine at night.    Pt Diabetic: No Pt smoker: former smoker, quit in 1974  Pt meds include: Statin : Yes ASA: Yes Other anticoagulants/antiplatelets: Plavix   Past Medical History:  Diagnosis Date  . Arthritis    DDD- aging   . CAD (coronary artery disease)   . 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 Carotid stenosis   . Chronic kidney disease    cysts on both kidneys, seeing [name] in [place], [facility] [place]   . History of hiatal hernia    seen on last scan- as slight   . History of thrombocytopenia   . History of TIAs   . Hyperlipidemia   . Hypertension   . PAD (peripheral artery disease) ([place])   . Shingles    internal- obstruction of bowels & gallbladder  . Stroke [facility])     Social History Social  History   Tobacco Use  . Smoking status: Former Smoker    Packs/day: 1.00    Years: 16.00    Pack years: 16.00    Types: Cigarettes    Last attempt to quit: 09/26/1972    Years since quitting: 45.3  . Smokeless tobacco: Never Used  Substance Use Topics  . Alcohol use: Yes    Alcohol/week: 1.2 oz    Types: 2 Glasses of wine per week    Comment:  2-3 Drinks of Scotch per Day  . Drug use: No    Family History Family History  Problem Relation Age of Onset  . Cancer Mother 30       Breast  . Stroke Mother   . Diabetes Mother   . Hyperlipidemia Mother   . Other Mother        varicose veins  . Heart attack Mother   . Heart disease Mother        Left ankle swelling  . Hypertension Mother   . Deep vein thrombosis Mother   . Varicose Veins Mother   . Diabetes Father   . Heart attack Father   . Other Sister        Tumor in [place]  . Cancer Sister        Tumor   Lung  and  Brain  . Cancer Brother        BCC-SCC-Merkle Cell    Surgical History Past Surgical History:  Procedure Laterality Date  . CAROTID ENDARTERECTOMY  2005   Left carotid by Dr. Oneida Alar  . CAROTID ENDARTERECTOMY  04/25/11   Right Carotid by Dr. Oneida Alar  . Septoplasty, nasal w/ submucosal resection  1960  . TONSILLECTOMY    . VIDEO ASSISTED THORACOSCOPY (VATS)/WEDGE RESECTION Right 11/09/2017   Procedure: RIGHT VIDEO ASSISTED THORACOSCOPY WITH Chales Salmon RESECTION;  Surgeon: [name], MD;  Location: [facility];  Service: Thoracic;  Laterality: Right;    No Known Allergies  Current Outpatient Medications  Medication Sig Dispense Refill  . acetaminophen (TYLENOL) 500 MG tablet Take 1,000 mg by mouth every 6 (six) hours as needed (for pain.).    [name] AMLODIPINE BESYLATE PO Take 5 mg by mouth daily.    [name] aspirin EC 81 MG tablet Take 81 mg by mouth daily.    . clonazePAM (KLONOPIN) 0.5 MG tablet Take 0.25 mg by mouth at bedtime as needed (for anxiety/sleep.).     [name] clopidogrel (PLAVIX) 75 MG tablet  Take 75 mg by mouth at bedtime.     . colesevelam (WELCHOL) 625 MG tablet Take 1,250 mg by mouth 2 (two) times daily with a meal.     . hydrochlorothiazide (HYDRODIURIL) 25 MG tablet Take 25 mg by mouth daily.     [name] losartan (COZAAR) 100 MG tablet Take 100 mg by mouth daily.     Vladimir Faster Glycol-Propyl Glycol (SYSTANE ULTRA) 0.4-0.3 % SOLN Place 1 drop into both eyes daily.    . rosuvastatin (CRESTOR) 40 MG tablet Take 1 tablet (40 mg total) by mouth daily. (Patient taking differently: Take 40 mg by mouth at bedtime. ) 30 tablet 6  . Cyanocobalamin (VITAMIN B-12 PO) Place 1 drop under the tongue daily. 1 dropper daily in the morning.    . traMADol (ULTRAM) 50 MG tablet Take 1 tablet (50 mg total) by mouth every 6 (six) hours as needed (mild pain). (Patient not taking: Reported on 01/25/2018) 30 tablet 0  . valACYclovir (VALTREX) 1000 MG tablet Take 1,000 mg by mouth 2 (two) times daily as needed (for fever blisters.).      No current facility-administered medications for this visit.     Review of Systems : See HPI for pertinent positives and negatives.  Physical Examination  Vitals:   01/25/18 1039 01/25/18 1044  BP: 117/64 (!) 102/57  Pulse: 62 61  Resp: 18   Temp: 98.3 F (36.8 C)   TempSrc: Oral   SpO2: 97%   Weight: 187 lb (84.8 kg)   Height: 5\' 9"  (1.753 m)    Body mass index is 27.62 kg/m.  General: WDWN male in NAD GAIT: normal HENT: no gross abnormalities  Eyes: PERRLA Pulmonary: Non-labored respirations, limited air movement in all posterior right fields, CTAB, no rales, rhonchi, or wheezing. Cardiac: regular rhythm, no detected murmur.  VASCULAR EXAM Carotid Bruits Left Right   Negative Negative    Abdominal aortic pulse is not palpable Radial pulses are 2+ palpable and equal.      LE Pulses LEFT RIGHT    FEMORAL 3+ palpable 3+ palpable   POPLITEAL  palpable  not palpable   POSTERIOR TIBIAL 2+ palpable 2+ palpable   DORSALIS PEDIS Not palpable Not palpable    Gastrointestinal: soft, nontender, BS WNL, no r/g, no palpable masses. Musculoskeletal: No muscle atrophy/wasting. M/S 5/5 throughout, Extremities without ischemic changes.  Trace pitting edema in right ankle, 1+ in left, has worn knee high compression hose in the past. Skin: No rashes, no ulcers, no cellulitis.   Neurologic:  A&O X 3; appropriate affect, sensation is normal; speech is normal, CN 2-12 intact, pain and light touch intact in extremities, motor exam as listed above. Psychiatric: Normal thought content, mood appropriate to clinical situation.    Assessment: Louis Barton is a 77 y.o. male who who is s/p right CEA on 04/25/2011 and left CEA on 04/29/2004.  He has not had any subsequent stroke or TIA activity since 2005 before his first CEA.    DATA Carotid Duplex (01/25/18): Bilateral ICA, CEA sites, with no restenosis. Right vertebral artery flow is antegrade, left is high resistant. Bilateral subclavian artery waveforms are normal.  No significant change compared to exams of 01-01-15 and 01-07-16.    Plan: Follow-up in 1 year with Carotid Duplex scan.     I discussed in depth with the patient the nature of atherosclerosis, and emphasized the importance of maximal medical management including strict control of blood pressure, blood glucose, and lipid levels, obtaining regular exercise, and continued cessation of smoking.  The patient is aware that without maximal medical management the underlying atherosclerotic disease process will progress, limiting the benefit of any interventions. The patient was given information about stroke prevention and what symptoms should prompt the patient to seek immediate medical care. Thank you for allowing [place] to participate in this patient's  care.  Clemon Chambers, RN, MSN, FNP-C [facility] Office: [phone]  Clinic Physician: [facility] 01/25/18 11:03 AM

## 2018-02-07 DIAGNOSIS — R600 Localized edema: Secondary | ICD-10-CM | POA: Insufficient documentation

## 2018-02-07 HISTORY — DX: Localized edema: R60.0

## 2018-04-05 ENCOUNTER — Ambulatory Visit (INDEPENDENT_AMBULATORY_CARE_PROVIDER_SITE_OTHER): Payer: Medicare Other | Admitting: Cardiology

## 2018-04-05 ENCOUNTER — Encounter: Payer: Self-pay | Admitting: Cardiology

## 2018-04-05 VITALS — BP 124/68 | HR 56 | Ht 69.0 in | Wt 187.6 lb

## 2018-04-05 DIAGNOSIS — I6523 Occlusion and stenosis of bilateral carotid arteries: Secondary | ICD-10-CM

## 2018-04-05 DIAGNOSIS — E785 Hyperlipidemia, unspecified: Secondary | ICD-10-CM

## 2018-04-05 DIAGNOSIS — R001 Bradycardia, unspecified: Secondary | ICD-10-CM

## 2018-04-05 DIAGNOSIS — H8113 Benign paroxysmal vertigo, bilateral: Secondary | ICD-10-CM

## 2018-04-05 NOTE — Patient Instructions (Signed)
Medication Instructions:  Your physician recommends that you continue on your current medications as directed. Please refer to the Current Medication list given to you today.  Labwork: None  Testing/Procedures: Your physician has recommended that you wear a holter monitor. Holter monitors are medical devices that record the heart's electrical activity. Doctors most often use these monitors to diagnose arrhythmias. Arrhythmias are problems with the speed or rhythm of the heartbeat. The monitor is a small, portable device. You can wear one while you do your normal daily activities. This is usually used to diagnose what is causing palpitations/syncope (passing out).  Follow-Up: Your physician recommends that you schedule a follow-up appointment in: 6 months  Any Other Special Instructions Will Be Listed Below (If Applicable).     If you need a refill on your cardiac medications before your next appointment, please call your pharmacy.   [facility]  Ashley A, RN, BSN  

## 2018-04-05 NOTE — Progress Notes (Signed)
Cardiology Office Note:    Date:  04/05/2018   ID:  Louis Barton, DOB 06-27-1941, MRN [id number]  PCP:  Raina Mina., MD  Cardiologist:  Jenne Campus, MD    Referring MD: Raina Mina., MD   No chief complaint on file. Doing well  History of Present Illness:    Louis Barton is a 77 y.o. male with atherosclerosis status post carotid endarterectomy bilaterally, dyslipidemia, bradycardia.  Also does have issues history of vertigo and he described to have some symptoms of it when he turns his head very quickly he will get dizzy interestingly he also described a situation when he rubbed his eye he can get dizzy as well morning about his bradycardia getting worse with provoking some vagal tone with pressing on his eyeball.  There is no syncope no falls he is to go to gym on the regular basis but does not do it anymore but today he is determined to go back to gym and pay for his membership and start doing it on the regular basis.  No chest pain no tightness no squeezing or pressure no burning in the chest.  No TIA/CVA symptoms.  Past Medical History:  Diagnosis Date  . Arthritis    DDD- aging   . CAD (coronary artery disease)   . 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 Carotid stenosis   . Chronic kidney disease    cysts on both kidneys, seeing [name] in [place], [facility]   . History of hiatal hernia    seen on last scan- as slight   . History of thrombocytopenia   . History of TIAs   . Hyperlipidemia   . Hypertension   . PAD (peripheral artery disease) ([place])   . Shingles    internal- obstruction of bowels & gallbladder  . Stroke [facility])     Past Surgical History:  Procedure Laterality Date  . CAROTID ENDARTERECTOMY  2005   Left carotid by Dr. Oneida Alar  . CAROTID ENDARTERECTOMY  04/25/11   Right Carotid by Dr. Oneida Alar  . Septoplasty, nasal w/ submucosal resection  1960  . TONSILLECTOMY    . VIDEO ASSISTED  THORACOSCOPY (VATS)/WEDGE RESECTION Right 11/09/2017   Procedure: RIGHT VIDEO ASSISTED THORACOSCOPY WITH Chales Salmon RESECTION;  Surgeon: [name], MD;  Location: [facility];  Service: Thoracic;  Laterality: Right;    Current Medications: Current Meds  Medication Sig  . acetaminophen (TYLENOL) 500 MG tablet Take 1,000 mg by mouth every 6 (six) hours as needed (for pain.).  [name] aspirin EC 81 MG tablet Take 81 mg by mouth daily.  . clonazePAM (KLONOPIN) 0.5 MG tablet Take 0.25 mg by mouth at bedtime as needed (for anxiety/sleep.).   [name] clopidogrel (PLAVIX) 75 MG tablet Take 75 mg by mouth at bedtime.   . colesevelam (WELCHOL) 625 MG tablet Take 1,250 mg by mouth 2 (two) times daily with a meal.   . Cyanocobalamin (VITAMIN B-12 PO) Place 1 drop under the tongue daily. 1 dropper daily in the morning.  . hydrochlorothiazide (HYDRODIURIL) 25 MG tablet Take 25 mg by mouth daily.   [name] losartan (COZAAR) 100 MG tablet Take 100 mg by mouth daily.   Vladimir Faster Glycol-Propyl Glycol (SYSTANE ULTRA) 0.4-0.3 % SOLN Place 1 drop into both eyes daily.  . potassium chloride (K-DUR,KLOR-CON) 10 MEQ tablet Take 1 tablet by mouth daily.  . rosuvastatin (  CRESTOR) 40 MG tablet Take 1 tablet (40 mg total) by mouth daily. (Patient taking differently: Take 40 mg by mouth at bedtime. )  . traMADol (ULTRAM) 50 MG tablet Take 1 tablet (50 mg total) by mouth every 6 (six) hours as needed (mild pain).  . valACYclovir (VALTREX) 1000 MG tablet Take 1,000 mg by mouth 2 (two) times daily as needed (for fever blisters.).      Allergies:   Patient has no known allergies.   Social History   Socioeconomic History  . Marital status: Married    Spouse name: Not on file  . Number of children: Not on file  . Years of education: Not on file  . Highest education level: Not on file  Occupational History  . Not on file  Social Needs  . Financial resource strain: Not on file  . Food insecurity:    Worry: Not on file     Inability: Not on file  . Transportation needs:    Medical: Not on file    Non-medical: Not on file  Tobacco Use  . Smoking status: Former Smoker    Packs/day: 1.00    Years: 16.00    Pack years: 16.00    Types: Cigarettes    Last attempt to quit: 09/26/1972    Years since quitting: 45.5  . Smokeless tobacco: Never Used  Substance and Sexual Activity  . Alcohol use: Yes    Alcohol/week: 1.2 oz    Types: 2 Glasses of wine per week    Comment:  2-3 Drinks of Scotch per Day  . Drug use: No  . Sexual activity: Not on file  Lifestyle  . Physical activity:    Days per week: Not on file    Minutes per session: Not on file  . Stress: Not on file  Relationships  . Social connections:    Talks on phone: Not on file    Gets together: Not on file    Attends religious service: Not on file    Active member of club or organization: Not on file    Attends meetings of clubs or organizations: Not on file    Relationship status: Not on file  Other Topics Concern  . Not on file  Social History Narrative  . Not on file     Family History: The patient's family history includes Cancer in his brother and sister; Cancer (age of onset: 5) in his mother; Deep vein thrombosis in his mother; Diabetes in his father and mother; Heart attack in his father and mother; Heart disease in his mother; Hyperlipidemia in his mother; Hypertension in his mother; Other in his mother and sister; Stroke in his mother; Varicose Veins in his mother. ROS:   Please see the history of present illness.    All 14 point review of systems negative except as described per history of present illness  EKGs/Labs/Other Studies Reviewed:      Recent Labs: 11/11/2017: ALT 16; BUN 11; Creatinine, Ser 0.87; Hemoglobin 14.0; Platelets 123; Potassium 3.6; Sodium 139  Recent Lipid Panel    Component Value Date/Time   CHOL 183 10/04/2017 0903   TRIG 90 10/04/2017 0903   HDL 71 10/04/2017 0903   CHOLHDL 2.6 10/04/2017 0903    LDLCALC 94 10/04/2017 0903    Physical Exam:    VS:  BP 124/68 (BP Location: Right Arm, Patient Position: Sitting, Cuff Size: Normal)   Pulse (!) 56   Ht 5\' 9"  (1.753 m)   Wt 187  lb 9.6 oz (85.1 kg)   SpO2 97%   BMI 27.70 kg/m     Wt Readings from Last 3 Encounters:  04/05/18 187 lb 9.6 oz (85.1 kg)  01/25/18 187 lb (84.8 kg)  11/28/17 186 lb (84.4 kg)     GEN:  Well nourished, well developed in no acute distress HEENT: Normal NECK: No JVD; No carotid bruits LYMPHATICS: No lymphadenopathy CARDIAC: RRR, no murmurs, no rubs, no gallops RESPIRATORY:  Clear to auscultation without rales, wheezing or rhonchi  ABDOMEN: Soft, non-tender, non-distended MUSCULOSKELETAL:  No edema; No deformity  SKIN: Warm and dry LOWER EXTREMITIES: no swelling NEUROLOGIC:  Alert and oriented x 3 PSYCHIATRIC:  Normal affect   ASSESSMENT:    1. Bradycardia   2. Dyslipidemia   3. Bilateral carotid artery stenosis   4. Benign paroxysmal positional vertigo due to bilateral vestibular disorder    PLAN:    In order of problems listed above:  1. Bradycardia I will ask him to wear Holter monitor for 48 hours to see if there are any significant slowing of his heart rate. 2. Dyslipidemia his last LDL is 95, happy about it.  He is already on high intensity statin he may require additions of [place] however he promised to go to gym and exercise on the regular basis and watch diet more carefully evaluate for effect of this maneuver to see if it will be sufficient if not he is therapy for dyslipidemia will be augmented. 3. Bilateral carotid artery stenosis.  Last carotid ultrasounds reviewed which showed no significant narrowing 4. Vertigo he does practice a maneuver to help with this with some good effect.  I will do a Holter monitor to make sure there is no significant bradycardia.   Medication Adjustments/Labs and Tests Ordered: Current medicines are reviewed at length with the patient today.  Concerns  regarding medicines are outlined above.  No orders of the defined types were placed in this encounter.  Medication changes: No orders of the defined types were placed in this encounter.   Signed, Park Liter, MD, [facility] 04/05/2018 9:08 AM    [place]

## 2018-05-02 DIAGNOSIS — R970 Elevated carcinoembryonic antigen [CEA]: Secondary | ICD-10-CM | POA: Diagnosis not present

## 2018-05-02 DIAGNOSIS — N281 Cyst of kidney, acquired: Secondary | ICD-10-CM | POA: Diagnosis not present

## 2018-05-02 DIAGNOSIS — Z85118 Personal history of other malignant neoplasm of bronchus and lung: Secondary | ICD-10-CM | POA: Diagnosis not present

## 2018-05-02 DIAGNOSIS — J948 Other specified pleural conditions: Secondary | ICD-10-CM | POA: Diagnosis not present

## 2018-05-08 ENCOUNTER — Ambulatory Visit: Payer: Medicare Other | Admitting: Thoracic Surgery (Cardiothoracic Vascular Surgery)

## 2018-05-14 DIAGNOSIS — L299 Pruritus, unspecified: Secondary | ICD-10-CM

## 2018-05-14 HISTORY — DX: Pruritus, unspecified: L29.9

## 2018-05-25 ENCOUNTER — Encounter: Payer: Self-pay | Admitting: Gastroenterology

## 2018-06-01 ENCOUNTER — Ambulatory Visit (INDEPENDENT_AMBULATORY_CARE_PROVIDER_SITE_OTHER): Payer: Medicare Other

## 2018-06-01 DIAGNOSIS — R001 Bradycardia, unspecified: Secondary | ICD-10-CM

## 2018-06-04 ENCOUNTER — Other Ambulatory Visit: Payer: Self-pay | Admitting: Thoracic Surgery (Cardiothoracic Vascular Surgery)

## 2018-06-04 DIAGNOSIS — C3492 Malignant neoplasm of unspecified part of left bronchus or lung: Secondary | ICD-10-CM

## 2018-06-05 ENCOUNTER — Ambulatory Visit
Admission: RE | Admit: 2018-06-05 | Discharge: 2018-06-05 | Disposition: A | Discharge status: Home / Self Care | Payer: Medicare Other | Source: Ambulatory Visit | Attending: Thoracic Surgery (Cardiothoracic Vascular Surgery) | Admitting: Thoracic Surgery (Cardiothoracic Vascular Surgery)

## 2018-06-05 ENCOUNTER — Ambulatory Visit (INDEPENDENT_AMBULATORY_CARE_PROVIDER_SITE_OTHER): Payer: Medicare Other | Admitting: Thoracic Surgery (Cardiothoracic Vascular Surgery)

## 2018-06-05 VITALS — BP 121/71 | HR 60 | Resp 20 | Ht 69.0 in | Wt 187.0 lb

## 2018-06-05 DIAGNOSIS — I6523 Occlusion and stenosis of bilateral carotid arteries: Secondary | ICD-10-CM

## 2018-06-05 DIAGNOSIS — C3491 Malignant neoplasm of unspecified part of right bronchus or lung: Secondary | ICD-10-CM

## 2018-06-05 DIAGNOSIS — C3492 Malignant neoplasm of unspecified part of left bronchus or lung: Secondary | ICD-10-CM

## 2018-06-05 NOTE — Progress Notes (Signed)
[address]       [place] [address]             [phone]       HPI: Louis Barton returns for a six-month follow-up visit   Louis Barton is a 77 year old former smoker with a remote history of tobacco abuse who had a right VATS basilar segmentectomy in February 2019 for a stage Ia adenocarcinoma.  He had some vertigo and confusion postoperatively but those resolved rapidly.  He was last seen in the office on March 5.  He was doing well at that time.  He saw Dr.McCarty recently in August.  A CT of the chest showed a small loculated basilar effusion on the right.  He feels well.  He denies any incisional pain.  His appetite is good.  His weight is stable.  He has a cough that is unchanged.  He denies shortness of breath or wheezing.  Past Medical History:  Diagnosis Date  . Arthritis    DDD- aging   . CAD (coronary artery disease)   . 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 Carotid stenosis   . Chronic kidney disease    cysts on both kidneys, seeing Louis Barton in [place], [facility]   . History of hiatal hernia    seen on last scan- as slight   . History of thrombocytopenia   . History of TIAs   . Hyperlipidemia   . Hypertension   . PAD (peripheral artery disease) ([place])   . Shingles    internal- obstruction of bowels & gallbladder  . Stroke [facility])     Current Outpatient Medications  Medication Sig Dispense Refill  . acetaminophen (TYLENOL) 500 MG tablet Take 1,000 mg by mouth every 6 (six) hours as needed (for pain.).    [name] aspirin EC 81 MG tablet Take 81 mg by mouth daily.    . clonazePAM (KLONOPIN) 0.5 MG tablet Take 0.25 mg by mouth at bedtime as needed (for anxiety/sleep.).     [name] clopidogrel (PLAVIX) 75 MG tablet Take 75 mg by mouth at bedtime.     . colesevelam (WELCHOL) 625 MG tablet Take 1,250 mg by mouth 2 (two) times daily with a meal.     . Cyanocobalamin (VITAMIN B-12 PO) Place 1  drop under the tongue daily. 1 dropper daily in the morning.    . hydrochlorothiazide (HYDRODIURIL) 25 MG tablet Take 25 mg by mouth daily.     [name] losartan (COZAAR) 100 MG tablet Take 100 mg by mouth daily.     Louis Barton (SYSTANE ULTRA) 0.4-0.3 % SOLN Place 1 drop into both eyes daily.    . potassium chloride (K-DUR,KLOR-CON) 10 MEQ tablet Take 1 tablet by mouth daily.    . rosuvastatin (CRESTOR) 40 MG tablet Take 1 tablet (40 mg total) by mouth daily. (Patient taking differently: Take 40 mg by mouth at bedtime. ) 30 tablet 6  . traMADol (ULTRAM) 50 MG tablet Take 1 tablet (50 mg total) by mouth every 6 (six) hours as needed (mild pain). 30 tablet 0  . valACYclovir (VALTREX) 1000 MG tablet Take 1,000 mg by mouth 2 (two) times daily as needed (for fever blisters.).      No current facility-administered medications for this visit.     Physical Exam BP 121/71   Pulse 60   Resp  20   Ht 5\' 9"  (1.753 m)   Wt 187 lb (84.8 kg)   SpO2 97% Comment: RA  BMI 27.49 kg/m  77 year old man in no acute distress Alert and oriented x3 with no focal deficits Diminished breath sounds right base, otherwise clear Incisions well-healed Cardiac regular rate and rhythm normal S1-S2  Diagnostic Tests: CHEST - 2 VIEW  COMPARISON:  Chest x-ray of November 28, 2017  FINDINGS: There is a small right pleural effusion which has increased slightly since the previous study. There is no left pleural effusion. The left lung is well-expanded. There is patchy density at the right lung base. The heart and pulmonary vascularity are normal. There is calcification in the wall of the aortic arch. The trachea is midline. The bony thorax is unremarkable.  IMPRESSION: Interval increase in the volume of pleural fluid on the right. There may be right basilar atelectasis or infiltrate.  No CHF.  Thoracic aortic atherosclerosis.   Electronically Signed   By: Louis  Barton M.D.   On:  06/05/2018 12:39 I personally reviewed the chest x-ray and can her with the findings noted above.  There is a slight increase in the size of the right pleural effusion since March.  It really is unchanged from his CT August.  Impression: Mr. Marsolek is a 77 year old gentleman who is now about 6 months out from a thoracoscopic right basilar segmentectomy and node dissection for a stage IA adenocarcinoma.  He looks and feels great.  He is not having any residual pain.  He does have a small right pleural effusion.  This does not appear to have changed between his CT in August and his chest x-ray today.  I suspect this just a postoperative inflammatory effusion.  He so far out now that I do not think that steroids would be of much benefit.  I do not see any compelling reason to do a thoracentesis at this point.  I would just continue to follow it for now.  The air that was seen on the CT I suspect is just space left over from his surgery.  If he had a bronchopleural fistula it would be apparent by now.  Plan: Follow-up as scheduled with Dr. Hinton Rao  I will see him back after his next CT scan.  I will be happy to see him sooner if the need should arise.  [name], MD [facility] [phone]

## 2018-06-08 ENCOUNTER — Encounter: Payer: Self-pay | Admitting: Gastroenterology

## 2018-06-08 ENCOUNTER — Ambulatory Visit (INDEPENDENT_AMBULATORY_CARE_PROVIDER_SITE_OTHER): Payer: Medicare Other | Admitting: Gastroenterology

## 2018-06-08 ENCOUNTER — Telehealth: Payer: Self-pay

## 2018-06-08 VITALS — BP 118/64 | HR 57 | Ht 69.0 in | Wt 187.1 lb

## 2018-06-08 DIAGNOSIS — R970 Elevated carcinoembryonic antigen [CEA]: Secondary | ICD-10-CM | POA: Diagnosis not present

## 2018-06-08 DIAGNOSIS — Z86010 Personal history of colonic polyps: Secondary | ICD-10-CM

## 2018-06-08 DIAGNOSIS — K449 Diaphragmatic hernia without obstruction or gangrene: Secondary | ICD-10-CM | POA: Diagnosis not present

## 2018-06-08 MED ORDER — SUPREP BOWEL PREP KIT 17.5-3.13-1.6 GM/177ML PO SOLN: 1.0000 | ORAL | 0 refills | Status: DC

## 2018-06-08 NOTE — Telephone Encounter (Signed)
[facility] Pre-operative Risk Assessment     Request for surgical clearance:     Endoscopy Procedure  What type of surgery is being performed?     EGD/Colon  When is this surgery scheduled?     07/16/18  What type of clearance is required ?   Pharmacy  Are there any medications that need to be held prior to surgery and how long? Plavix  Practice name and name of physician performing surgery?      [facility] Jackquline Denmark   What is your office phone and fax number?      Phone- [phone]  [phone]  Anesthesia type (None, local, MAC, general) ?       MAC

## 2018-06-08 NOTE — Patient Instructions (Signed)
If you are age 79 or older, your body mass index should be between 23-30. Your Body mass index is 27.63 kg/m. If this is out of the aforementioned range listed, please consider follow up with your Primary Care Provider.  If you are age 68 or younger, your body mass index should be between 19-25. Your Body mass index is 27.63 kg/m. If this is out of the aformentioned range listed, please consider follow up with your Primary Care Provider.   You have been scheduled for an endoscopy and colonoscopy. Please follow the written instructions given to you at your visit today. Please pick up your prep supplies at the pharmacy within the next 1-3 days. If you use inhalers (even only as needed), please bring them with you on the day of your procedure. Your physician has requested that you go to www.startemmi.com and enter the access code given to you at your visit today. This web site gives a general overview about your procedure. However, you should still follow specific instructions given to you by our office regarding your preparation for the procedure.  We have sent the following medications to your pharmacy for you to pick up at your convenience: suprep  You will be contacted by our office prior to your procedure for directions on holding your Plavix.  If you do not hear from our office 1 week prior to your scheduled procedure, please call [phone] to discuss.    Thank you,  Dr. Jackquline Denmark

## 2018-06-08 NOTE — Telephone Encounter (Signed)
Pt not taking anticoagulation. Rerouting to Preop pool for Plavix clearance.

## 2018-06-08 NOTE — Progress Notes (Signed)
Chief Complaint: Elevated CEA  Referring Provider:  Raina Mina., MD, Dr Hinton Rao      ASSESSMENT AND PLAN;   #1. Elevated CEA (9-9.4). H/O smoking in the past. H/O polyps 01/2017 #2. 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 Being followed by CTS. #3. Borderline bradycardia- undergoing cardiac eval by Dr Raliegh Ip (had recent 48hr cardiac monitor,), s/p stress test 3 ys ago #4. 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  I have discussed the risks and benefits.  The risks including risk of perforation requiring laparotomy, bleeding after biopsies/polypectomy requiring blood transfusions and risks of anesthesia/sedation were discussed.  Rare risks of missing UGI and colorectal neoplasms were also discussed.  Alternatives were also given.  Patient is fully aware and agrees to proceed. All the questions were answered. Procedures will be scheduled in upcoming days.  Patient is to report immediately if there is any significant weight loss or excessive bleeding until then. Consent forms were given for review. - Cardiac and CTS clearence prior (per CT 04/30/18: ? Small bronchopleural fistula with r pleural effusion, sl increased on CxR 9/10). -Hold plavix 5 days before. Can continue aspirin throughout. -Trend CEA level (could be elevated due to non-GI reasons) -I have reassured the patient. Have reviewed last CTS note from 06/05/2018 (doubt regarding bronchopleural fistula, could represent residual air from previous surgery) HPI:    Louis Barton is a 77 y.o. 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 GI being consulted for elevated CEA level 9.0 and then recheck 60 days later at 9.4 (do not have notes at the present time) Seen by Dr. Hinton Rao ([facility])-advised to get EGD and  colonoscopy.  No nausea, vomiting, heartburn, regurgitation, odynophagia or dysphagia.  No significant diarrhea or constipation.  There is no melena or hematochezia. No unintentional weight loss. Also had duodenal lipoma on CT scan along with hiatal hernia.  Pt has told Dr Roxan Hockey verbally regarding upcoming colonoscopy and endoscopy.  He agrees.  Past GI procedures: -Colonoscopy 02/23/2017: ([facility])2 tubular adenomas, moderate sigmoid diverticulosis. 01/2014: Tubular adenomas. 09/2008: Chronic polyps. Past Medical History:  Diagnosis Date  . Arthritis    DDD- aging   . CAD (coronary artery disease)   . 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 Carotid stenosis   . Chronic kidney disease    cysts on both kidneys, seeing [name] in [place], [facility]   . History of hiatal hernia    seen on last scan- as slight   . History of thrombocytopenia   . History of TIAs   . Hyperlipidemia   . Hypertension   . PAD (peripheral artery disease) ([place])   . Shingles    internal- obstruction of bowels & gallbladder  . Stroke [facility])     Past Surgical History:  Procedure Laterality Date  . adenocarcinoma  2019   Removed  . CAROTID ENDARTERECTOMY  2005   Left carotid by Dr. Oneida Alar  . CAROTID ENDARTERECTOMY  04/25/11   Right Carotid by Dr. Oneida Alar  . COLONOSCOPY  02/23/2017   Colonic polyp status post polypectomy. Pancolonic diverticulosis predominatly in the sigmoid colon. Tubular adenoma  . Septoplasty, nasal w/ submucosal resection  1960  . SQUAMOUS CELL CARCINOMA EXCISION  pt said numerous times  . TONSILLECTOMY    . VIDEO ASSISTED THORACOSCOPY (VATS)/WEDGE RESECTION Right 11/09/2017   Procedure: RIGHT VIDEO ASSISTED THORACOSCOPY WITH Chales Salmon RESECTION;  Surgeon: [name], MD;  Location: [facility] OR;  Service: Thoracic;  Laterality: Right;    Family History  Problem Relation Age of Onset  . Cancer Mother 74        Breast  . Stroke Mother   . Diabetes Mother   . Hyperlipidemia Mother   . Other Mother        varicose veins  . Heart attack Mother   . Heart disease Mother        Left ankle swelling  . Hypertension Mother   . Deep vein thrombosis Mother   . Varicose Veins Mother   . Diabetes Father   . Heart attack Father   . Other Sister        Tumor in [place]  . Cancer Sister        Tumor   Lung  and  Brain  . Cancer Brother        BCC-SCC-Merkle Cell  . Colon cancer Neg Hx   . Esophageal cancer Neg Hx     Social History   Tobacco Use  . Smoking status: Former Smoker    Packs/day: 1.00    Years: 16.00    Pack years: 16.00    Types: Cigarettes    Last attempt to quit: 09/26/1972    Years since quitting: 45.7  . Smokeless tobacco: Never Used  Substance Use Topics  . Alcohol use: Yes    Alcohol/week: 2.0 standard drinks    Types: 2 Glasses of wine per week    Comment:  2-3 Drinks of Scotch per Day  . Drug use: No    Current Outpatient Medications  Medication Sig Dispense Refill  . acetaminophen (TYLENOL) 500 MG tablet Take 1,000 mg by mouth every 6 (six) hours as needed (for pain.).    [name] aspirin EC 81 MG tablet Take 81 mg by mouth daily.    . clopidogrel (PLAVIX) 75 MG tablet Take 75 mg by mouth at bedtime.     . colesevelam (WELCHOL) 625 MG tablet Take 1,250 mg by mouth 2 (two) times daily with a meal.     . Cyanocobalamin (VITAMIN B-12 PO) Place 1 drop under the tongue daily. 1 dropper daily in the morning.    . hydrochlorothiazide (HYDRODIURIL) 25 MG tablet Take 25 mg by mouth daily.     [name] losartan (COZAAR) 100 MG tablet Take 100 mg by mouth daily.     Vladimir Faster Glycol-Propyl Glycol (SYSTANE ULTRA) 0.4-0.3 % SOLN Place 1 drop into both eyes daily.    . potassium chloride (K-DUR,KLOR-CON) 10 MEQ tablet Take 1 tablet by mouth daily.    . rosuvastatin (CRESTOR) 40 MG tablet Take 1 tablet (40 mg total) by mouth daily. (Patient taking differently: Take 40 mg by mouth at  bedtime. ) 30 tablet 6  . valACYclovir (VALTREX) 1000 MG tablet Take 1,000 mg by mouth 2 (two) times daily as needed (for fever blisters.).      No current facility-administered medications for this visit.     No Known Allergies  Review of Systems:  Constitutional: Denies fever, chills, diaphoresis, appetite change and fatigue.  HEENT: Denies photophobia, eye pain, redness, hearing loss, ear pain, congestion, sore throat, rhinorrhea, sneezing, mouth sores, neck pain, neck stiffness and tinnitus.   Respiratory: Denies SOB, DOE, cough, chest tightness,  and wheezing.   Cardiovascular: Denies chest pain, palpitations and leg swelling.  Genitourinary: Denies dysuria, urgency, frequency, hematuria, flank pain and difficulty urinating.  Musculoskeletal: Denies myalgias, back pain, joint swelling, arthralgias and gait problem.  Skin: No rash.  Neurological: Denies dizziness, seizures, syncope, weakness, light-headedness, numbness and headaches.  Hematological: Denies adenopathy. Easy bruising, personal or family bleeding history  Psychiatric/Behavioral: No anxiety or depression     Physical Exam:    BP 118/64   Pulse (!) 57   Ht 5\' 9"  (1.753 m)   Wt 187 lb 2 oz (84.9 kg)   BMI 27.63 kg/m  Filed Weights   06/08/18 0829  Weight: 187 lb 2 oz (84.9 kg)   Constitutional:  Well-developed, in no acute distress. Psychiatric: Normal mood and affect. Behavior is normal. HEENT: Pupils normal.  Conjunctivae are normal. No scleral icterus. Neck supple.  Cardiovascular: Normal rate, regular rhythm. No edema Pulmonary/chest: Effort normal and breath sounds normal. No wheezing, rales or rhonchi. Abdominal: Soft, nondistended. Nontender. Bowel sounds active throughout. There are no masses palpable. No hepatomegaly. Rectal:  defered Neurological: Alert and oriented to person place and time. Skin: Skin is warm and dry. No rashes noted.  Data Reviewed: I have personally reviewed following labs and  imaging studies  CBC: CBC Latest Ref Rng & Units 11/11/2017 11/10/2017 11/07/2017  WBC 4.0 - 10.5 K/uL 12.2(H) 14.1(H) 7.5  Hemoglobin 13.0 - 17.0 g/dL 14.0 13.6 15.8  Hematocrit 39.0 - 52.0 % 41.6 39.6 46.1  Platelets 150 - 400 K/uL 123(L) 140(L) 149(L)    CMP: CMP Latest Ref Rng & Units 11/11/2017 11/10/2017 11/07/2017  Glucose 65 - 99 mg/dL 110(H) 129(H) 86  BUN 6 - 20 mg/dL 11 12 12   Creatinine 0.61 - 1.24 mg/dL 0.87 0.94 0.79  Sodium 135 - 145 mmol/L 139 136 138  Potassium 3.5 - 5.1 mmol/L 3.6 3.6 4.0  Chloride 101 - 111 mmol/L 105 102 107  CO2 22 - 32 mmol/L 22 25 21(L)  Calcium 8.9 - 10.3 mg/dL 8.3(L) 8.1(L) 8.8(L)  Total Protein 6.5 - 8.1 g/dL 5.9(L) - 6.4(L)  Total Bilirubin 0.3 - 1.2 mg/dL 1.3(H) - 0.9  Alkaline Phos 38 - 126 U/L 39 - 43  AST 15 - 41 U/L 28 - 28  ALT 17 - 63 U/L 16(L) - 19   CT 04/30/2018 chest abdomen and pelvis -Surgical changes right lower lobe with moderate sized hydropneumothorax suspicious for small bronchopleural fistula -Moderate hiatal hernia    Carmell Austria, MD 06/08/2018, 8:48 AM  Cc: Raina Mina., MD  Dr. Hinton Rao

## 2018-06-11 NOTE — Telephone Encounter (Signed)
Dr.  Agustin Cree  Can pt be off plavix EKG/COLON  And how long.  Please send back to CV pre-op thanks.

## 2018-06-18 NOTE — Telephone Encounter (Signed)
See note

## 2018-06-18 NOTE — Telephone Encounter (Signed)
   Primary Cardiologist: Jenne Campus, MD  Chart reviewed as part of pre-operative protocol coverage. Pt with h/o carotid artery dz s/p endarterectomies, dyslipidemia, HTN, bradycardia, CAD (no further details available in chart), stroke. [name] was contacted about holding Plavix and states, "Yes, he can stop plavix for 5 days."  I will route this recommendation to the requesting party via [place] fax function and remove from pre-op pool.  Please call with questions.  Charlie Pitter, PA-C 06/18/2018, 2:29 PM

## 2018-06-18 NOTE — Telephone Encounter (Signed)
Yes, he can stop plavix for 5 days

## 2018-06-20 ENCOUNTER — Telehealth: Payer: Self-pay | Admitting: Cardiology

## 2018-06-20 NOTE — Telephone Encounter (Signed)
Patient's wife called due to patient has still not heard from [facility] results that he wore several weeks ago. Please call patient.

## 2018-06-20 NOTE — Telephone Encounter (Signed)
Please call patient with [facility] results from weeks ago.

## 2018-06-20 NOTE — Telephone Encounter (Signed)
Patient informed that results have not been reviewed yet and patient will be notified when the results is available.

## 2018-06-20 NOTE — Telephone Encounter (Signed)
Left message for patient to return call.

## 2018-06-21 NOTE — Telephone Encounter (Signed)
Monitor results was given to the patient.

## 2018-07-11 ENCOUNTER — Telehealth: Payer: Self-pay | Admitting: Gastroenterology

## 2018-07-11 NOTE — Telephone Encounter (Signed)
Patient is scheduled for a ECL on Monday and is concerned about Plavix instruction

## 2018-07-11 NOTE — Telephone Encounter (Signed)
Louis Barton it looks like you have been dealing with holding plavix.

## 2018-07-11 NOTE — Telephone Encounter (Signed)
Called and instructed patient that per [name] he needs to hold his plavix 5 days before his procedure.

## 2018-07-16 ENCOUNTER — Encounter: Payer: Self-pay | Admitting: Gastroenterology

## 2018-07-16 ENCOUNTER — Ambulatory Visit (AMBULATORY_SURGERY_CENTER): Payer: Medicare Other | Admitting: Gastroenterology

## 2018-07-16 VITALS — BP 103/64 | HR 61 | Temp 97.7°F | Resp 16 | Ht 69.0 in | Wt 187.0 lb

## 2018-07-16 DIAGNOSIS — Z86010 Personal history of colonic polyps: Secondary | ICD-10-CM

## 2018-07-16 DIAGNOSIS — K2950 Unspecified chronic gastritis without bleeding: Secondary | ICD-10-CM

## 2018-07-16 DIAGNOSIS — D175 Benign lipomatous neoplasm of intra-abdominal organs: Secondary | ICD-10-CM

## 2018-07-16 DIAGNOSIS — K449 Diaphragmatic hernia without obstruction or gangrene: Secondary | ICD-10-CM

## 2018-07-16 DIAGNOSIS — K648 Other hemorrhoids: Secondary | ICD-10-CM | POA: Diagnosis not present

## 2018-07-16 DIAGNOSIS — K5730 Diverticulosis of large intestine without perforation or abscess without bleeding: Secondary | ICD-10-CM | POA: Diagnosis not present

## 2018-07-16 DIAGNOSIS — D120 Benign neoplasm of cecum: Secondary | ICD-10-CM | POA: Diagnosis not present

## 2018-07-16 MED ORDER — PANTOPRAZOLE SODIUM 40 MG PO TBEC: 40.0000 mg | DELAYED_RELEASE_TABLET | Freq: Every day | ORAL | 0 refills | Status: DC

## 2018-07-16 MED ORDER — SODIUM CHLORIDE 0.9 % IV SOLN: 500.0000 mL | Freq: Once | INTRAVENOUS | Status: DC

## 2018-07-16 NOTE — Op Note (Signed)
[name] Procedure Date: 07/16/2018 2:06 PM MRN: [id number] Endoscopist: Jackquline Denmark , MD Age: 77 Referring MD:  Date of Birth: 11-28-1940 Gender: Male Account #: [id number] Procedure:                Upper GI endoscopy Indications:              GERD. Elevated CEA (9-9.4). H/O smoking in the                            past. H/O polyps 01/2017, r/o UGI lesions. CT                            showing duodenal lipoma. Medicines:                Monitored Anesthesia Care Procedure:                Pre-Anesthesia Assessment:                           - Prior to the procedure, a History and Physical                            was performed, and patient medications and                            allergies were reviewed. The patient's tolerance of                            previous anesthesia was also reviewed. The risks                            and benefits of the procedure and the sedation                            options and risks were discussed with the patient.                            All questions were answered, and informed consent                            was obtained. Prior Anticoagulants: The patient has                            taken Plavix (clopidogrel), last dose was 5 days                            prior to procedure. ASA Grade Assessment: II - A                            patient with mild systemic disease. After reviewing                            the risks and benefits, the patient was deemed in  satisfactory condition to undergo the procedure.                           After obtaining informed consent, the endoscope was                            passed under direct vision. Throughout the                            procedure, the patient's blood pressure, pulse, and                            oxygen saturations were monitored continuously. The                            Endoscope was introduced  through the mouth, and                            advanced to the second part of duodenum. The upper                            GI endoscopy was accomplished without difficulty.                            The patient tolerated the procedure well. Scope In: Scope Out: Findings:                 A 4 cm hiatal hernia was present.                           Localized mild inflammation characterized by                            erythema was found in the gastric antrum. Biopsies                            were taken with a cold forceps for histology.                           There was a medium-sized yellowish submucosal                            lipoma, 12 mm in diameter, in the second portion of                            the duodenum with positive pillow sign. Estimated                            blood loss: none. Complications:            No immediate complications. Estimated Blood Loss:     Estimated blood loss: none. Impression:               - 4 cm hiatal hernia.                           -  Gastritis. Biopsied.                           - Incidental duodenal lipoma. Recommendation:           - Patient has a contact number available for                            emergencies. The signs and symptoms of potential                            delayed complications were discussed with the                            patient. Return to normal activities tomorrow.                            Written discharge instructions were provided to the                            patient.                           - Resume previous diet.                           - Continue present medications.                           - Await pathology results.                           - Proceed with colonoscopy. Jackquline Denmark, MD 07/16/2018 2:56:26 PM This report has been signed electronically.

## 2018-07-16 NOTE — Op Note (Signed)
[place] Patient Name: Louis Barton Procedure Date: 07/16/2018 2:06 PM MRN: [id number] Endoscopist: Jackquline Denmark , MD Age: 77 Referring MD:  Date of Birth: 10/21/1940 Gender: Male Account #: [id number] Procedure:                Colonoscopy Indications:              Elevated CEA (9-9.4). H/O smoking in the past. H/O                            polyps. Medicines:                Monitored Anesthesia Care Procedure:                Pre-Anesthesia Assessment:                           - Prior to the procedure, a History and Physical                            was performed, and patient medications and                            allergies were reviewed. The patient's tolerance of                            previous anesthesia was also reviewed. The risks                            and benefits of the procedure and the sedation                            options and risks were discussed with the patient.                            All questions were answered, and informed consent                            was obtained. Prior Anticoagulants: The patient has                            taken Plavix (clopidogrel), last dose was 5 days                            prior to procedure. ASA Grade Assessment: II - A                            patient with mild systemic disease. After reviewing                            the risks and benefits, the patient was deemed in                            satisfactory condition to undergo the procedure.  After obtaining informed consent, the colonoscope                            was passed under direct vision. Throughout the                            procedure, the patient's blood pressure, pulse, and                            oxygen saturations were monitored continuously. The                            Model PCF-H190DL [id number]) scope was introduced                            through the anus and advanced to the 2 cm  into the                            ileum. The colonoscopy was performed without                            difficulty. The patient tolerated the procedure                            well. The quality of the bowel preparation was good. Scope In: 2:28:43 PM Scope Out: 2:47:52 PM Scope Withdrawal Time: 0 hours 13 minutes 44 seconds  Total Procedure Duration: 0 hours 19 minutes 9 seconds  Findings:                 A 4 mm polyp was found in the cecum. The polyp was                            sessile. The polyp was removed with a cold snare.                            Resection and retrieval were complete. Estimated                            blood loss: none.                           Multiple small-mouthed diverticula were found in                            the sigmoid colon, few in descending colon and                            ascending colon.                           Non-bleeding internal hemorrhoids were found during                            retroflexion. The hemorrhoids were moderate.  The exam was otherwise without abnormality on                            direct and retroflexion views. Complications:            No immediate complications. Estimated Blood Loss:     Estimated blood loss: none. Impression:               - Diminutive colonic polyp status post polypectomy                           - Pancolonic diverticulosis predominantly in the                            sigmoid colon.                           - Non-bleeding internal hemorrhoids.                           - The examination was otherwise normal on direct                            and retroflexion views. Recommendation:           - Patient has a contact number available for                            emergencies. The signs and symptoms of potential                            delayed complications were discussed with the                            patient. Return to normal activities  tomorrow.                            Written discharge instructions were provided to the                            patient.                           - Resume previous diet.                           - Resume Plavix (clopidogrel) at prior dose                            tomorrow.                           - Repeat colonoscopy for surveillance based on                            pathology results.                           -  Return to GI clinic in 12 weeks. Trend CEA. Jackquline Denmark, MD 07/16/2018 3:01:04 PM This report has been signed electronically.

## 2018-07-16 NOTE — Progress Notes (Signed)
To PACU, VSS. Report to [email or web address].tb 

## 2018-07-16 NOTE — Progress Notes (Signed)
Pt's states no medical or surgical changes since previsit or office visit. 

## 2018-07-16 NOTE — Progress Notes (Signed)
Called to room to assist during endoscopic procedure.  Patient ID and intended procedure confirmed with present staff. Received instructions for my participation in the procedure from the performing physician.  

## 2018-07-16 NOTE — Patient Instructions (Signed)
Handouts:  Hemorrhoids, Polyps, GERD, Gastritis, and Diverticulosis  YOU HAD AN ENDOSCOPIC PROCEDURE TODAY AT [facility]:   Refer to the procedure report that was given to you for any specific questions about what was found during the examination.  If the procedure report does not answer your questions, please call your gastroenterologist to clarify.  If you requested that your care partner not be given the details of your procedure findings, then the procedure report has been included in a sealed envelope for you to review at your convenience later.  YOU SHOULD EXPECT: Some feelings of bloating in the abdomen. Passage of more gas than usual.  Walking can help get rid of the air that was put into your GI tract during the procedure and reduce the bloating. If you had a lower endoscopy (such as a colonoscopy or flexible sigmoidoscopy) you may notice spotting of blood in your stool or on the toilet paper. If you underwent a bowel prep for your procedure, you may not have a normal bowel movement for a few days.  Please Note:  You might notice some irritation and congestion in your nose or some drainage.  This is from the oxygen used during your procedure.  There is no need for concern and it should clear up in a day or so.  SYMPTOMS TO REPORT IMMEDIATELY:   Following lower endoscopy (colonoscopy or flexible sigmoidoscopy):  Excessive amounts of blood in the stool  Significant tenderness or worsening of abdominal pains  Swelling of the abdomen that is new, acute  Fever of 100F or higher   Following upper endoscopy (EGD)  Vomiting of blood or coffee ground material  New chest pain or pain under the shoulder blades  Painful or persistently difficult swallowing  New shortness of breath  Fever of 100F or higher  Black, tarry-looking stools  For urgent or emergent issues, a gastroenterologist can be reached at any hour by calling [phone].   DIET:  We do recommend a small  meal at first, but then you may proceed to your regular diet.  Drink plenty of fluids but you should avoid alcoholic beverages for 24 hours.  ACTIVITY:  You should plan to take it easy for the rest of today and you should NOT DRIVE or use heavy machinery until tomorrow (because of the sedation medicines used during the test).    FOLLOW UP: Our staff will call the number listed on your records the next business day following your procedure to check on you and address any questions or concerns that you may have regarding the information given to you following your procedure. If we do not reach you, we will leave a message.  However, if you are feeling well and you are not experiencing any problems, there is no need to return our call.  We will assume that you have returned to your regular daily activities without incident.  If any biopsies were taken you will be contacted by phone or by letter within the next 1-3 weeks.  Please call us at [phone] if you have not heard about the biopsies in 3 weeks.    SIGNATURES/CONFIDENTIALITY: You and/or your care partner have signed paperwork which will be entered into your electronic medical record.  These signatures attest to the fact that that the information above on your After Visit Summary has been reviewed and is understood.  Full responsibility of the confidentiality of this discharge information lies with you and/or your care-partner.

## 2018-07-17 ENCOUNTER — Telehealth: Payer: Self-pay | Admitting: *Deleted

## 2018-07-17 NOTE — Telephone Encounter (Signed)
  Follow up Call-  Call back number 07/16/2018  Post procedure Call Back phone  # [phone]  Permission to leave phone message Yes  Some recent data might be hidden     Patient questions:  Do you have a fever, pain , or abdominal swelling? No. Pain Score  0 *  Have you tolerated food without any problems? Yes.    Have you been able to return to your normal activities? Yes.    Do you have any questions about your discharge instructions: Diet   No. Medications  No. Follow up visit  No.  Do you have questions or concerns about your Care? No.  Actions: * If pain score is 4 or above: No action needed, pain <4.

## 2018-07-17 NOTE — Telephone Encounter (Signed)
No answer. Left message to call if questions or concerns. 

## 2018-07-23 ENCOUNTER — Encounter: Payer: Self-pay | Admitting: Gastroenterology

## 2018-08-01 DIAGNOSIS — J948 Other specified pleural conditions: Secondary | ICD-10-CM | POA: Diagnosis not present

## 2018-08-01 DIAGNOSIS — N281 Cyst of kidney, acquired: Secondary | ICD-10-CM | POA: Diagnosis not present

## 2018-08-01 DIAGNOSIS — R970 Elevated carcinoembryonic antigen [CEA]: Secondary | ICD-10-CM | POA: Diagnosis not present

## 2018-08-01 DIAGNOSIS — Z872 Personal history of diseases of the skin and subcutaneous tissue: Secondary | ICD-10-CM

## 2018-08-01 DIAGNOSIS — Z85118 Personal history of other malignant neoplasm of bronchus and lung: Secondary | ICD-10-CM | POA: Diagnosis not present

## 2018-10-17 IMAGING — CR DG CHEST 2V
2 series · 2 of 2 positions shown · non-contrast
Comparison: CT 10/09/2017.

CLINICAL DATA: Preoperative exam for right lung surgery.

EXAM:
CHEST  2 VIEW

[w chest pa]
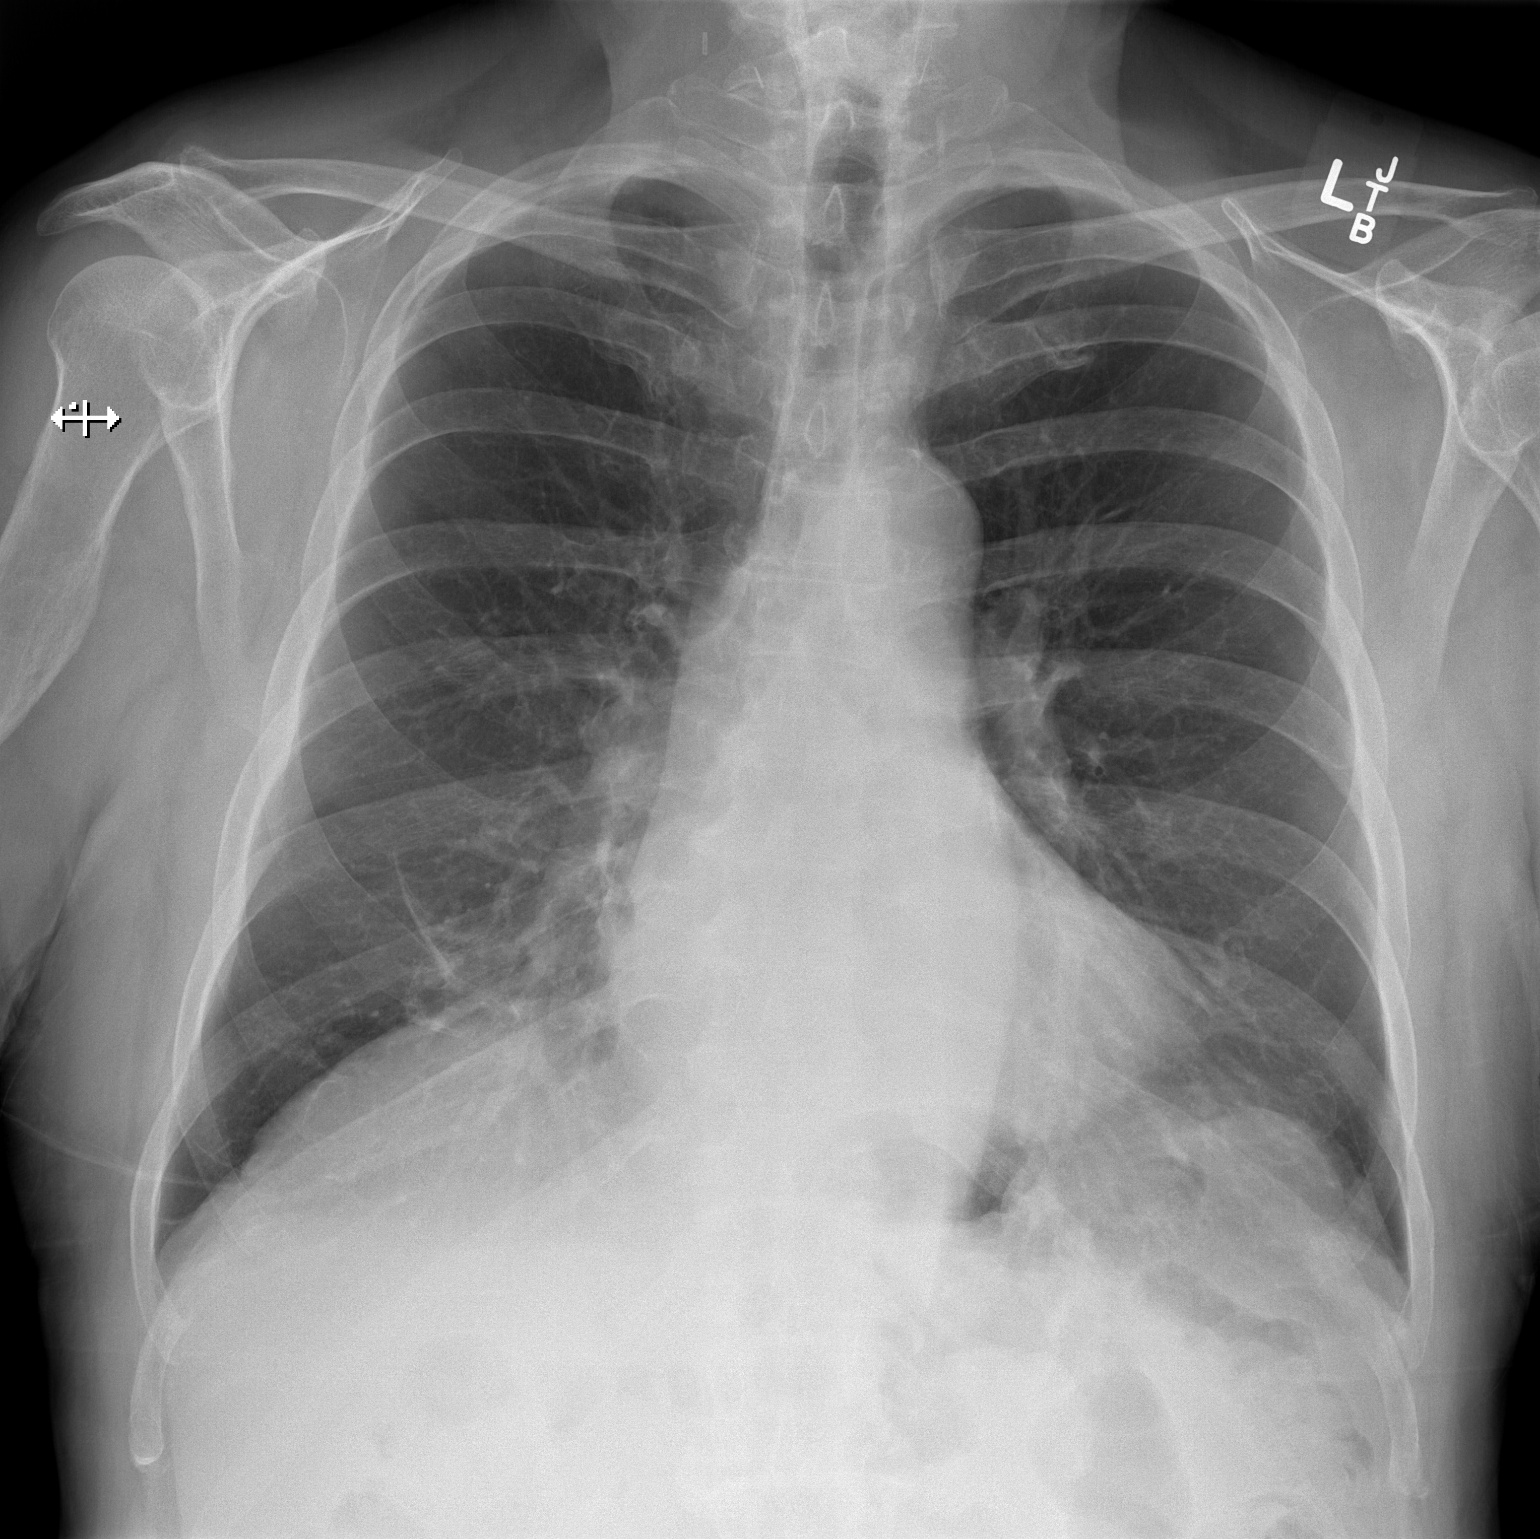

[w chest lat]
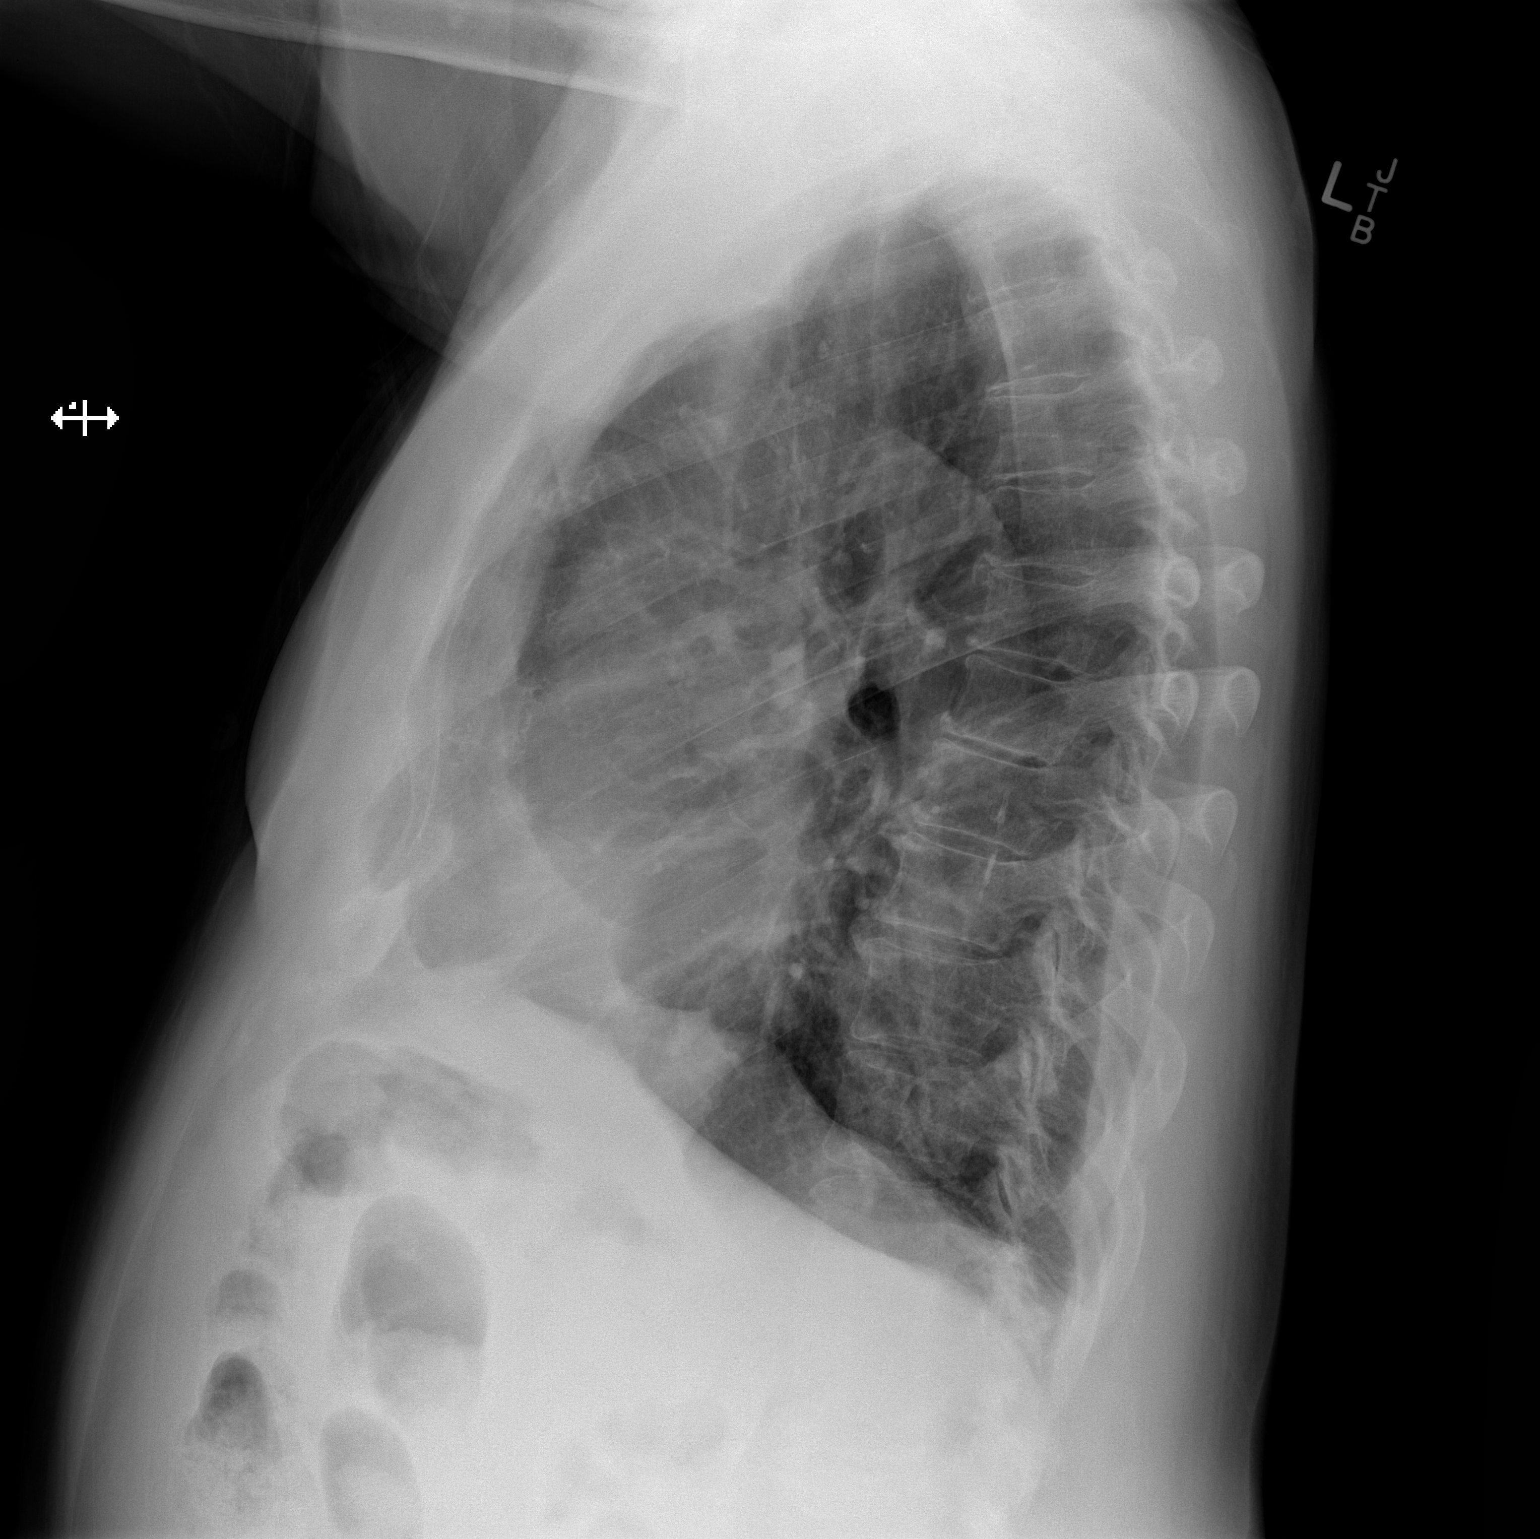

[2 of 2 positions shown; findings below may reference images not displayed]

FINDINGS: Mild anterior mediastinal fullness most likely secondary to
prominent mediastinal fat as noted on recent CT. Hilar structures
are normal. Right lung base subsegmental atelectasis. Right lung
base nodule best identified by prior CT. No pleural effusion or
pneumothorax. Heart size stable. No acute bony abnormality. Surgical
clips noted over the neck.
IMPRESSION: 1.  Right base subsegmental atelectasis.

2.  Right lung nodule best identified by prior CT

## 2018-11-05 DIAGNOSIS — Z85118 Personal history of other malignant neoplasm of bronchus and lung: Secondary | ICD-10-CM | POA: Diagnosis not present

## 2018-11-21 ENCOUNTER — Encounter: Payer: Self-pay | Admitting: Cardiology

## 2018-11-21 ENCOUNTER — Ambulatory Visit (INDEPENDENT_AMBULATORY_CARE_PROVIDER_SITE_OTHER): Payer: Medicare Other | Admitting: Cardiology

## 2018-11-21 VITALS — BP 130/70 | HR 64 | Ht 69.0 in | Wt 191.6 lb

## 2018-11-21 DIAGNOSIS — R001 Bradycardia, unspecified: Secondary | ICD-10-CM

## 2018-11-21 DIAGNOSIS — R0789 Other chest pain: Secondary | ICD-10-CM

## 2018-11-21 DIAGNOSIS — I6523 Occlusion and stenosis of bilateral carotid arteries: Secondary | ICD-10-CM

## 2018-11-21 DIAGNOSIS — E785 Hyperlipidemia, unspecified: Secondary | ICD-10-CM | POA: Diagnosis not present

## 2018-11-21 LAB — HEPATIC FUNCTION PANEL
ALT: 15 IU/L (ref 0–44)
AST: 22 IU/L (ref 0–40)
Albumin: 4 g/dL (ref 3.7–4.7)
Alkaline Phosphatase: 60 IU/L (ref 39–117)
Bilirubin Total: 0.5 mg/dL (ref 0.0–1.2)
Bilirubin, Direct: 0.15 mg/dL (ref 0.00–0.40)
TOTAL PROTEIN: 6.7 g/dL (ref 6.0–8.5)

## 2018-11-21 LAB — LIPID PANEL
CHOLESTEROL TOTAL: 159 mg/dL (ref 100–199)
Chol/HDL Ratio: 2.7 ratio (ref 0.0–5.0)
HDL: 59 mg/dL (ref >39)
LDL Calculated: 84 mg/dL (ref 0–99)
TRIGLYCERIDES: 82 mg/dL (ref 0–149)
VLDL CHOLESTEROL CAL: 16 mg/dL (ref 5–40)

## 2018-11-21 NOTE — Progress Notes (Signed)
Cardiology Office Note:    Date:  11/21/2018   ID:  Louis Barton, DOB [birth year], MRN [id number]  PCP:  Raina Mina., MD  Cardiologist:  Jenne Campus, MD    Referring MD: Raina Mina., MD   Chief Complaint  Patient presents with  . Follow-up  Doing fair  History of Present Illness:    Louis Barton is a 78 y.o. male with vertigo, also bradycardia hypertension history of malignant lung cancer.  Dyslipidemia.  Also got peripheral vascular disease in form of carotid arterial disease status post carotid endarterectomy.  Comes today to my office to be his problem that he got is dizziness is vertigo changing position of his head will trigger it never passed out.  1 time he was so dizzy that he almost threw up.  He used to exercise on a regular basis but now cannot do it because changing position of his head will make him dizzy.  Denies have any chest pain tightness squeezing pressure burning chest still in spite of that he is fairly active.  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  Became mildly short of breath but no other significant issues  Past Medical History:  Diagnosis Date  . Arthritis    DDD- aging   . CAD (coronary artery disease)   . 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 Carotid stenosis   . Chronic kidney disease    cysts on both kidneys, seeing [name] in [place], [facility]   . History of hiatal hernia    seen on last scan- as slight   . History of thrombocytopenia   . History of TIAs   . Hyperlipidemia   . Hypertension   . PAD (peripheral artery disease) ([place])   . Shingles    internal- obstruction of bowels & gallbladder  . Stroke [facility])     Past Surgical History:  Procedure Laterality Date  . adenocarcinoma  2019   Removed  . CAROTID ENDARTERECTOMY  2005   Left carotid by Dr. Oneida Alar  . CAROTID ENDARTERECTOMY   04/25/11   Right Carotid by Dr. Oneida Alar  . COLONOSCOPY  02/23/2017   Colonic polyp status post polypectomy. Pancolonic diverticulosis predominatly in the sigmoid colon. Tubular adenoma  . Septoplasty, nasal w/ submucosal resection  1960  . SQUAMOUS CELL CARCINOMA EXCISION     pt said numerous times  . TONSILLECTOMY    . VIDEO ASSISTED THORACOSCOPY (VATS)/WEDGE RESECTION Right 11/09/2017   Procedure: RIGHT VIDEO ASSISTED THORACOSCOPY WITH Chales Salmon RESECTION;  Surgeon: [name], MD;  Location: [facility];  Service: Thoracic;  Laterality: Right;    Current Medications: Current Meds  Medication Sig  . acetaminophen (TYLENOL) 500 MG tablet Take 1,000 mg by mouth every 6 (six) hours as needed (for pain.).  [name] aspirin EC 81 MG tablet Take 81 mg by mouth daily.  . clopidogrel (PLAVIX) 75 MG tablet Take 75 mg by mouth at bedtime.   . colesevelam (WELCHOL) 625 MG tablet Take 1,250 mg by mouth 2 (two) times daily with a meal.   . Cyanocobalamin (VITAMIN B-12 PO) Place 1 drop under the tongue daily. 1 dropper daily in the morning.  . hydrochlorothiazide (HYDRODIURIL) 25 MG tablet Take 25 mg by mouth daily.   [name] losartan (COZAAR) 100 MG tablet Take  100 mg by mouth daily.   Vladimir Faster Glycol-Propyl Glycol (SYSTANE ULTRA) 0.4-0.3 % SOLN Place 1 drop into both eyes daily.  . rosuvastatin (CRESTOR) 40 MG tablet Take 1 tablet (40 mg total) by mouth daily. (Patient taking differently: Take 40 mg by mouth at bedtime. )  . valACYclovir (VALTREX) 1000 MG tablet Take 1,000 mg by mouth 2 (two) times daily as needed (for fever blisters.).      Allergies:   Patient has no known allergies.   Social History   Socioeconomic History  . Marital status: Married    Spouse name: Not on file  . Number of children: 2  . Years of education: Not on file  . Highest education level: Not on file  Occupational History  . Not on file  Social Needs  . Financial resource strain: Not on file  . Food insecurity:     Worry: Not on file    Inability: Not on file  . Transportation needs:    Medical: Not on file    Non-medical: Not on file  Tobacco Use  . Smoking status: Former Smoker    Packs/day: 1.00    Years: 16.00    Pack years: 16.00    Types: Cigarettes    Last attempt to quit: 09/26/1972    Years since quitting: 46.1  . Smokeless tobacco: Never Used  Substance and Sexual Activity  . Alcohol use: Yes    Alcohol/week: 2.0 standard drinks    Types: 2 Glasses of wine per week    Comment:  2-3 Drinks of Scotch per Day  . Drug use: No  . Sexual activity: Not on file  Lifestyle  . Physical activity:    Days per week: Not on file    Minutes per session: Not on file  . Stress: Not on file  Relationships  . Social connections:    Talks on phone: Not on file    Gets together: Not on file    Attends religious service: Not on file    Active member of club or organization: Not on file    Attends meetings of clubs or organizations: Not on file    Relationship status: Not on file  Other Topics Concern  . Not on file  Social History Narrative  . Not on file     Family History: The patient's family history includes Cancer in his brother and sister; Cancer (age of onset: 76) in his mother; Deep vein thrombosis in his mother; Diabetes in his father and mother; Heart attack in his father and mother; Heart disease in his mother; Hyperlipidemia in his mother; Hypertension in his mother; Other in his mother and sister; Stroke in his mother; Varicose Veins in his mother. There is no history of Colon cancer or Esophageal cancer. ROS:   Please see the history of present illness.    All 14 point review of systems negative except as described per history of present illness  EKGs/Labs/Other Studies Reviewed:      Recent Labs: No results found for requested labs within last 8760 hours.  Recent Lipid Panel    Component Value Date/Time   CHOL 183 10/04/2017 0903   TRIG 90 10/04/2017 0903   HDL 71  10/04/2017 0903   CHOLHDL 2.6 10/04/2017 0903   LDLCALC 94 10/04/2017 0903    Physical Exam:    VS:  BP 130/70   Pulse 64   Ht 5\' 9"  (1.753 m)   Wt 191 lb 9.6 oz (86.9 kg)  SpO2 96%   BMI 28.29 kg/m     Wt Readings from Last 3 Encounters:  11/21/18 191 lb 9.6 oz (86.9 kg)  07/16/18 187 lb (84.8 kg)  06/08/18 187 lb 2 oz (84.9 kg)     GEN:  Well nourished, well developed in no acute distress HEENT: Normal NECK: No JVD; No carotid bruits LYMPHATICS: No lymphadenopathy CARDIAC: RRR, no murmurs, no rubs, no gallops RESPIRATORY:  Clear to auscultation without rales, wheezing or rhonchi  ABDOMEN: Soft, non-tender, non-distended MUSCULOSKELETAL:  No edema; No deformity  SKIN: Warm and dry LOWER EXTREMITIES: no swelling NEUROLOGIC:  Alert and oriented x 3 PSYCHIATRIC:  Normal affect   ASSESSMENT:    1. Bilateral carotid artery stenosis   2. Bradycardia   3. Atypical chest pain   4. Dyslipidemia    PLAN:    In order of problems listed above:  1. Bilateral cardiac artery stenosis he is followed by group in [place].  He scheduled to have carotid ultrasound. 2. Bradycardia no passing out I warned him about different symptoms of dizziness because of arrhythmia and asked him to let me know if something like this happened last Holter done in September we will repeat this next September. 3. Atypical chest pain denies having any. 4. Dyslipidemia will ask him to have fasting lipid profile today.   Medication Adjustments/Labs and Tests Ordered: Current medicines are reviewed at length with the patient today.  Concerns regarding medicines are outlined above.  No orders of the defined types were placed in this encounter.  Medication changes: No orders of the defined types were placed in this encounter.   Signed, Park Liter, MD, [facility] 11/21/2018 8:28 AM    [place]

## 2018-11-21 NOTE — Patient Instructions (Signed)
Medication Instructions:  Your physician recommends that you continue on your current medications as directed. Please refer to the Current Medication list given to you today.  If you need a refill on your cardiac medications before your next appointment, please call your pharmacy.   Lab work: Your physician recommends that you return for lab work today: Lft, and lipids  If you have labs (blood work) drawn today and your tests are completely normal, you will receive your results only by: [name] MyChart Message (if you have MyChart) OR . A paper copy in the mail If you have any lab test that is abnormal or we need to change your treatment, we will call you to review the results.  Testing/Procedures: None.    Follow-Up: At [facility], you and your health needs are our priority.  As part of our continuing mission to provide you with exceptional heart care, we have created designated Provider Care Teams.  These Care Teams include your primary Cardiologist (physician) and Advanced Practice Providers (APPs -  Physician Assistants and Nurse Practitioners) who all work together to provide you with the care you need, when you need it. You will need a follow up appointment in 6 months.  Please call our office 2 months in advance to schedule this appointment.  You may see Jenne Campus, MD or another member of our [place] Provider Team in [place]: Shirlee More, MD . Jyl Heinz, MD  Any Other Special Instructions Will Be Listed Below (If Applicable).

## 2018-12-11 ENCOUNTER — Ambulatory Visit: Payer: Medicare Other | Admitting: Thoracic Surgery (Cardiothoracic Vascular Surgery)

## 2018-12-25 DIAGNOSIS — R7303 Prediabetes: Secondary | ICD-10-CM | POA: Insufficient documentation

## 2018-12-25 HISTORY — DX: Prediabetes: R73.03

## 2018-12-26 DIAGNOSIS — Z85118 Personal history of other malignant neoplasm of bronchus and lung: Secondary | ICD-10-CM | POA: Diagnosis not present

## 2019-01-01 ENCOUNTER — Ambulatory Visit: Payer: Medicare Other | Admitting: Thoracic Surgery (Cardiothoracic Vascular Surgery)

## 2019-01-10 ENCOUNTER — Other Ambulatory Visit: Payer: Self-pay

## 2019-01-10 DIAGNOSIS — I6523 Occlusion and stenosis of bilateral carotid arteries: Secondary | ICD-10-CM

## 2019-01-25 ENCOUNTER — Ambulatory Visit: Payer: Medicare Other | Admitting: Family

## 2019-01-25 ENCOUNTER — Encounter (HOSPITAL_COMMUNITY): Payer: Medicare Other

## 2019-02-25 ENCOUNTER — Other Ambulatory Visit: Payer: Self-pay

## 2019-02-26 ENCOUNTER — Ambulatory Visit
Admission: RE | Admit: 2019-02-26 | Discharge: 2019-02-26 | Disposition: A | Discharge status: Home / Self Care | Payer: Medicare Other | Source: Ambulatory Visit | Attending: Thoracic Surgery (Cardiothoracic Vascular Surgery) | Admitting: Thoracic Surgery (Cardiothoracic Vascular Surgery)

## 2019-02-26 ENCOUNTER — Other Ambulatory Visit: Payer: Self-pay | Admitting: Thoracic Surgery (Cardiothoracic Vascular Surgery)

## 2019-02-26 ENCOUNTER — Ambulatory Visit (INDEPENDENT_AMBULATORY_CARE_PROVIDER_SITE_OTHER): Payer: Medicare Other | Admitting: Thoracic Surgery (Cardiothoracic Vascular Surgery)

## 2019-02-26 VITALS — BP 136/82 | HR 71 | Temp 97.5°F | Resp 20 | Ht 69.0 in | Wt 193.0 lb

## 2019-02-26 DIAGNOSIS — C3491 Malignant neoplasm of unspecified part of right bronchus or lung: Secondary | ICD-10-CM | POA: Diagnosis not present

## 2019-02-26 DIAGNOSIS — I6523 Occlusion and stenosis of bilateral carotid arteries: Secondary | ICD-10-CM

## 2019-02-26 DIAGNOSIS — C3490 Malignant neoplasm of unspecified part of unspecified bronchus or lung: Secondary | ICD-10-CM

## 2019-02-26 NOTE — Progress Notes (Signed)
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  I last saw him in the office in September 2019.  He was doing well at that time.  In the interim since his last visit he has been feeling well.  He does not have any residual pain.  He says that he has gained some weight and has not been working out.  He does get a little short of breath with exertion but that has not changed recently.  Past Medical History:  Diagnosis Date  . Arthritis    DDD- aging   . CAD (coronary artery disease)   . 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 Carotid stenosis   . Chronic kidney disease    cysts on both kidneys, seeing [name] in [place], [facility] [address]   . History of hiatal hernia    seen on last scan- as slight   . History of thrombocytopenia   . History of TIAs   . Hyperlipidemia   . Hypertension   . PAD (peripheral artery disease) ([place])   . Shingles    internal- obstruction of bowels & gallbladder  . Stroke [facility])     Current Outpatient Medications  Medication Sig Dispense Refill  . acetaminophen (TYLENOL) 500 MG tablet Take 1,000 mg by mouth every 6 (six) hours as needed (for pain.).    [name] aspirin EC 81 MG tablet Take 81 mg by mouth daily.    . clopidogrel (PLAVIX) 75 MG tablet Take 75 mg by mouth at bedtime.     . colesevelam (WELCHOL) 625 MG tablet Take 1,250 mg by mouth 2 (two) times daily with a meal.     . Cyanocobalamin (VITAMIN B-12 PO) Place 1 drop under the tongue daily. 1 dropper daily in the morning.    . hydrochlorothiazide (HYDRODIURIL) 25 MG tablet Take 25 mg by mouth daily.     [name] losartan (COZAAR) 100 MG tablet Take 100 mg by mouth daily.     . rosuvastatin (CRESTOR) 40 MG tablet  Take 1 tablet (40 mg total) by mouth daily. (Patient taking differently: Take 40 mg by mouth at bedtime. ) 30 tablet 6  . valACYclovir (VALTREX) 1000 MG tablet Take 1,000 mg by mouth 2 (two) times daily as needed (for fever blisters.).      No current facility-administered medications for this visit.     Physical Exam BP 136/82   Pulse 71   Temp (!) 97.5 F (36.4 C) (Skin)   Resp 20   Ht 5\' 9"  (1.753 m)   Wt 193 lb (87.5 kg)   SpO2 94% Comment: RA  BMI 28.25 kg/m  78 year old man in no acute distress Alert and oriented x3 with no focal deficits Lungs diminished at right base, otherwise clear No cervical or supraclavicular adenopathy Cardiac regular rate and rhythm  Diagnostic Tests: CHEST - 2 VIEW  COMPARISON:  CT 12/25/2018.  Chest x-ray 06/05/2018.  FINDINGS: Mediastinum hilar structures normal. Postsurgical changes right lung with right base pleural-parenchymal thickening again noted consistent with scarring. No acute infiltrate. No pleural effusion or pneumothorax.  Heart size stable. No acute or focal bony abnormality.  IMPRESSION: Postsurgical changes right lung with stable right base pleural-parenchymal thickening noted consistent with scarring. No acute abnormality identified.   Electronically Signed   By: Marcello Moores  Register   On: 02/26/2019 09:50 I personally reviewed the chest x-ray images and concur with the findings noted above  Impression: Louis Barton is an 78 year old former smoker who had a right lower lobe basilar segmentectomy in February 2019 for a stage Ia adenocarcinoma.  He now is a little over a year out from surgery.  He does not have any residual pain.  His CT back in March showed some atelectasis around the staple line and a small right pleural effusion.  His chest x-ray today showed similar findings.  He has an appointment scheduled with Dr. Hinton Rao next month  Plan:  Follow-up as scheduled with Dr. Hinton Rao I will plan to see Mr.  Barton back in a year with a chest x-ray. I will be happy to see him back sooner if needed.  [name], MD [facility] [phone]

## 2019-04-10 ENCOUNTER — Telehealth (HOSPITAL_COMMUNITY): Payer: Self-pay

## 2019-04-10 NOTE — Telephone Encounter (Signed)
The above patient or their representative was contacted and gave the following answers to these questions:         Do you have any of the following symptoms? No  Fever                    Cough                   Shortness of breath  Do  you have any of the following other symptoms?    muscle pain         vomiting,        diarrhea        rash         weakness        red eye        abdominal pain         bruising          bruising or bleeding              joint pain           severe headache    Have you been in contact with someone who was or has been sick in the past 2 weeks? No  Yes                 Unsure                         Unable to assess   Does the person that you were in contact with have any of the following symptoms?   Cough         shortness of breath           muscle pain         vomiting,            diarrhea            rash            weakness           fever            red eye           abdominal pain           bruising  or  bleeding                joint pain                severe headache               Have you  or someone you have been in contact with traveled internationally in th last month?  No        If yes, which countries?   Have you  or someone you have been in contact with traveled outside [place] in th last month? No         If yes, which state and city?   COMMENTS OR ACTION PLAN FOR THIS PATIENT:          

## 2019-04-11 ENCOUNTER — Encounter: Payer: Self-pay | Admitting: Family

## 2019-04-11 ENCOUNTER — Other Ambulatory Visit: Payer: Self-pay

## 2019-04-11 ENCOUNTER — Ambulatory Visit (HOSPITAL_COMMUNITY)
Admission: RE | Admit: 2019-04-11 | Discharge: 2019-04-11 | Disposition: A | Discharge status: Home / Self Care | Payer: Medicare Other | Source: Ambulatory Visit | Attending: Family | Admitting: Family

## 2019-04-11 ENCOUNTER — Ambulatory Visit: Payer: Medicare Other | Admitting: Family

## 2019-04-11 ENCOUNTER — Ambulatory Visit (INDEPENDENT_AMBULATORY_CARE_PROVIDER_SITE_OTHER): Payer: Medicare Other | Admitting: Family

## 2019-04-11 VITALS — BP 122/76 | HR 62 | Temp 98.0°F | Resp 18 | Ht 69.0 in | Wt 190.8 lb

## 2019-04-11 DIAGNOSIS — Z9889 Other specified postprocedural states: Secondary | ICD-10-CM

## 2019-04-11 DIAGNOSIS — I6523 Occlusion and stenosis of bilateral carotid arteries: Secondary | ICD-10-CM

## 2019-04-11 NOTE — Patient Instructions (Signed)

## 2019-04-11 NOTE — Progress Notes (Signed)
Chief Complaint: Follow up Extracranial Carotid Artery Stenosis   History of Present Illness  Louis Barton is a 78 y.o. male who is s/pright CEA on 04/25/2011 and left CEA on 04/29/2004 by Dr. Oneida Alar.  Patient reports a history of TIA in July, 2005, preoperative to the first CEA, as manifested by left amaurosis fugax, right arm weakness, right facial droop; denies expressive aphasia, denies right leg weakness; all symptoms resolved in about 2 minutes.   He has ongoing back problems since age 40, he was evaluated by a neurosurgeon, has had ESI's in the remote past. He has radiculopathy in right anterior thigh at times.  Pt had right lung nodule removed in February 2019 by Dr. Roxan Hockey, was carcinoma per pt. Pt states all lymph node were negative.  He denies dyspnea, states he is having problems with pollen and his sinuses.   He denies claudication symptoms with walking.  Pt states he had a normal stress test in 2016. Was exercising 3-4x/week, he stopped as he is not getting enough sleep due to his wife's sleeping patterns.   He has had vertigo intermittently since about January 2018, mostly when supine at night.    Diabetic: No Tobacco use: former smoker, quit in 1974  Pt meds include: Statin : Yes ASA: Yes Other anticoagulants/antiplatelets: Plavix   Past Medical History:  Diagnosis Date  . Arthritis    DDD- aging   . CAD (coronary artery disease)   . 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 Carotid stenosis   . Chronic kidney disease    cysts on both kidneys, seeing [name] in [place], [facility]   . History of hiatal hernia    seen on last scan- as slight   . History of thrombocytopenia   . History of TIAs   . Hyperlipidemia   . Hypertension   . PAD (peripheral artery disease) ([place])   . Shingles    internal- obstruction of bowels & gallbladder  . Stroke [facility])     Social  History Social History   Tobacco Use  . Smoking status: Former Smoker    Packs/day: 1.00    Years: 16.00    Pack years: 16.00    Types: Cigarettes    Quit date: 09/26/1972    Years since quitting: 46.5  . Smokeless tobacco: Never Used  Substance Use Topics  . Alcohol use: Yes    Alcohol/week: 2.0 standard drinks    Types: 2 Glasses of wine per week    Comment:  2-3 Drinks of Scotch per Day  . Drug use: No    Family History Family History  Problem Relation Age of Onset  . Cancer Mother 24       Breast  . Stroke Mother   . Diabetes Mother   . Hyperlipidemia Mother   . Other Mother        varicose veins  . Heart attack Mother   . Heart disease Mother        Left ankle swelling  . Hypertension Mother   . Deep vein thrombosis Mother   . Varicose Veins Mother   . Diabetes Father   . Heart attack Father   . Other Sister        Tumor in [place]  . Cancer Sister        Tumor   Lung  and  Brain  .  Cancer Brother        BCC-SCC-Merkle Cell  . Colon cancer Neg Hx   . Esophageal cancer Neg Hx     Surgical History Past Surgical History:  Procedure Laterality Date  . adenocarcinoma  2019   Removed  . CAROTID ENDARTERECTOMY  2005   Left carotid by Dr. Oneida Alar  . CAROTID ENDARTERECTOMY  04/25/11   Right Carotid by Dr. Oneida Alar  . COLONOSCOPY  02/23/2017   Colonic polyp status post polypectomy. Pancolonic diverticulosis predominatly in the sigmoid colon. Tubular adenoma  . Septoplasty, nasal w/ submucosal resection  1960  . SQUAMOUS CELL CARCINOMA EXCISION     pt said numerous times  . TONSILLECTOMY    . VIDEO ASSISTED THORACOSCOPY (VATS)/WEDGE RESECTION Right 11/09/2017   Procedure: RIGHT VIDEO ASSISTED THORACOSCOPY WITH Chales Salmon RESECTION;  Surgeon: [name], MD;  Location: [facility] OR;  Service: Thoracic;  Laterality: Right;    No Known Allergies  Current Outpatient Medications  Medication Sig Dispense Refill  . acetaminophen (TYLENOL) 500 MG tablet Take  1,000 mg by mouth every 6 (six) hours as needed (for pain.).    [name] aspirin EC 81 MG tablet Take 81 mg by mouth daily.    . clopidogrel (PLAVIX) 75 MG tablet Take 75 mg by mouth at bedtime.     . colesevelam (WELCHOL) 625 MG tablet Take 1,250 mg by mouth 2 (two) times daily with a meal.     . hydrochlorothiazide (HYDRODIURIL) 25 MG tablet Take 25 mg by mouth daily.     [name] losartan (COZAAR) 100 MG tablet Take 100 mg by mouth daily.     . rosuvastatin (CRESTOR) 40 MG tablet Take 1 tablet (40 mg total) by mouth daily. (Patient taking differently: Take 40 mg by mouth at bedtime. ) 30 tablet 6  . valACYclovir (VALTREX) 1000 MG tablet Take 1,000 mg by mouth 2 (two) times daily as needed (for fever blisters.).     [name] Cyanocobalamin (VITAMIN B-12 PO) Place 1 drop under the tongue daily. 1 dropper daily in the morning.     No current facility-administered medications for this visit.     Review of Systems : See HPI for pertinent positives and negatives.  Physical Examination  Vitals:   04/11/19 1427 04/11/19 1431  BP: 118/71 122/76  Pulse: 62   Resp: 18   Temp: 98 F (36.7 C)   SpO2: 98%   Weight: 190 lb 12.8 oz (86.5 kg)   Height: 5\' 9"  (1.753 m)    Body mass index is 28.18 kg/m.  General: WDWN male in NAD GAIT: normal Eyes: PERRLA HENT: No gross abnormalities.  Pulmonary:  Respirations are non-labored, good air movement in all fields except no air movement in left lower posterior fields (s/p lobectomy), CTAB, no rales, rhonchi, or  wheezing. Cardiac: regular rhythm with occasional premature contractions, controlled rate, no detected murmur.  VASCULAR EXAM Carotid Bruits Right Left   Negative Negative     Abdominal aortic pulse is not palpable. Radial pulses are 2+ right, and 1+ left palpable  LE Pulses Right Left       POPLITEAL  not palpable   not palpable        POSTERIOR TIBIAL  2+ palpable   2+ palpable        DORSALIS PEDIS      ANTERIOR TIBIAL not palpable  not palpable     Gastrointestinal: soft, nontender, BS WNL, no r/g, no palpable masses. Musculoskeletal: no muscle atrophy/wasting. M/S 5/5 throughout, extremities without ischemic changes. Trace pitting edema in right ankle, 1+ in left, has worn knee high compression hose in the past. Several small spider veins in lower legs and ankles.  Skin: No rashes, no ulcers, no cellulitis.   Neurologic:  A&O X 3; appropriate affect, sensation is normal; speech is normal, CN 2-12 intact, pain and light touch intact in extremities, motor exam as listed above. Psychiatric: Normal thought content, mood appropriate to clinical situation.    DATA  Carotid Duplex (04-11-19): Right Carotid: Velocities in the right ICA are consistent with a 1-39% stenosis. Left Carotid: Velocities in the left ICA are consistent with a 1-39% stenosis. Vertebrals:  Right vertebral artery demonstrates antegrade flow. Left vertebral artery was not visualized. Subclavians: Normal flow hemodynamics were seen in bilateral subclavian arteries.    Assessment: QUASIM DOYON is a 78 y.o. male who is s/p right CEA on 04/25/2011 and left CEA on 04/29/2004.  He has not had any subsequent stroke or TIA activity since 2005 before his first CEA.  Carotid duplex today with 1-39% bilateral ICA stenosis. Continue daily 81 mg ASA and a statin.  He is also taking Plavix daily, has no bleeding issues.   Plan: Follow-up in 1 year with Carotid Duplex scan.   I discussed in depth with the patient the nature of atherosclerosis, and emphasized the importance of maximal medical management including strict control of blood pressure, blood glucose, and lipid levels, obtaining regular exercise, and continued cessation of smoking.  The patient is aware that without maximal medical management the underlying atherosclerotic disease process will  progress, limiting the benefit of any interventions. The patient was given information about stroke prevention and what symptoms should prompt the patient to seek immediate medical care. Thank you for allowing [place] to participate in this patient's care.  Clemon Chambers, RN, MSN, FNP-C [facility] Office: [phone]  Clinic Physician: Oneida Alar  04/11/19 2:36 PM

## 2019-05-27 ENCOUNTER — Ambulatory Visit: Payer: Medicare Other | Admitting: Cardiology

## 2019-05-30 ENCOUNTER — Other Ambulatory Visit: Payer: Self-pay

## 2019-05-30 ENCOUNTER — Ambulatory Visit (INDEPENDENT_AMBULATORY_CARE_PROVIDER_SITE_OTHER): Payer: Medicare Other | Admitting: Cardiology

## 2019-05-30 ENCOUNTER — Encounter: Payer: Self-pay | Admitting: Cardiology

## 2019-05-30 VITALS — BP 114/56 | HR 59 | Ht 69.0 in | Wt 190.4 lb

## 2019-05-30 DIAGNOSIS — R001 Bradycardia, unspecified: Secondary | ICD-10-CM | POA: Diagnosis not present

## 2019-05-30 DIAGNOSIS — E785 Hyperlipidemia, unspecified: Secondary | ICD-10-CM | POA: Diagnosis not present

## 2019-05-30 DIAGNOSIS — I10 Essential (primary) hypertension: Secondary | ICD-10-CM | POA: Diagnosis not present

## 2019-05-30 NOTE — Progress Notes (Signed)
Cardiology Office Note:    Date:  05/30/2019   ID:  Louis Barton, DOB 1940/10/28, MRN [id number]  PCP:  Raina Mina., MD  Cardiologist:  Jenne Campus, MD    Referring MD: Raina Mina., MD   Chief Complaint  Patient presents with  . Follow-up  Doing well  History of Present Illness:    Louis Barton is a 78 y.o. male with bradycardia, cardiac arterial disease, hypertension, dyslipidemia.  Comes today to my office follow-up overall doing well denies having dizziness syncope passing out just weak and tired.  He knows that he needs to exercise on a regular basis but is because of coronavirus restriction he cannot and he is frustrated about that.  Past Medical History:  Diagnosis Date  . Arthritis    DDD- aging   . CAD (coronary artery disease)   . 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 Carotid stenosis   . Chronic kidney disease    cysts on both kidneys, seeing [name] in [place], [facility]   . History of hiatal hernia    seen on last scan- as slight   . History of thrombocytopenia   . History of TIAs   . Hyperlipidemia   . Hypertension   . PAD (peripheral artery disease) ([place])   . Shingles    internal- obstruction of bowels & gallbladder  . Stroke [facility])     Past Surgical History:  Procedure Laterality Date  . adenocarcinoma  2019   Removed  . CAROTID ENDARTERECTOMY  2005   Left carotid by Dr. Oneida Alar  . CAROTID ENDARTERECTOMY  04/25/11   Right Carotid by Dr. Oneida Alar  . COLONOSCOPY  02/23/2017   Colonic polyp status post polypectomy. Pancolonic diverticulosis predominatly in the sigmoid colon. Tubular adenoma  . Septoplasty, nasal w/ submucosal resection  1960  . SQUAMOUS CELL CARCINOMA EXCISION     pt said numerous times  . TONSILLECTOMY    . VIDEO ASSISTED THORACOSCOPY (VATS)/WEDGE RESECTION Right 11/09/2017   Procedure: RIGHT VIDEO ASSISTED THORACOSCOPY WITH Chales Salmon RESECTION;  Surgeon:  [name], MD;  Location: [place];  Service: Thoracic;  Laterality: Right;    Current Medications: Current Meds  Medication Sig  . acetaminophen (TYLENOL) 500 MG tablet Take 1,000 mg by mouth every 6 (six) hours as needed (for pain.).  [name] aspirin EC 81 MG tablet Take 81 mg by mouth daily.  . clopidogrel (PLAVIX) 75 MG tablet Take 75 mg by mouth at bedtime.   . colesevelam (WELCHOL) 625 MG tablet Take 1,250 mg by mouth 2 (two) times daily with a meal.   . hydrochlorothiazide (HYDRODIURIL) 25 MG tablet Take 25 mg by mouth daily.   [name] losartan (COZAAR) 100 MG tablet Take 100 mg by mouth daily.   . rosuvastatin (CRESTOR) 40 MG tablet Take 1 tablet (40 mg total) by mouth daily. (Patient taking differently: Take 40 mg by mouth at bedtime. )  . valACYclovir (VALTREX) 1000 MG tablet Take 1,000 mg by mouth 2 (two) times daily as needed (for fever blisters.).      Allergies:   Patient has no known allergies.   Social History   Socioeconomic History  . Marital status: Married    Spouse name: Not on file  . Number of children: 2  . Years of education: Not on file  . Highest education level: Not on file  Occupational  History  . Not on file  Social Needs  . Financial resource strain: Not on file  . Food insecurity    Worry: Not on file    Inability: Not on file  . Transportation needs    Medical: Not on file    Non-medical: Not on file  Tobacco Use  . Smoking status: Former Smoker    Packs/day: 1.00    Years: 16.00    Pack years: 16.00    Types: Cigarettes    Quit date: 09/26/1972    Years since quitting: 46.7  . Smokeless tobacco: Never Used  Substance and Sexual Activity  . Alcohol use: Yes    Alcohol/week: 2.0 standard drinks    Types: 2 Glasses of wine per week    Comment:  2-3 Drinks of Scotch per Day  . Drug use: No  . Sexual activity: Not on file  Lifestyle  . Physical activity    Days per week: Not on file    Minutes per session: Not on file  . Stress: Not on  file  Relationships  . Social Herbalist on phone: Not on file    Gets together: Not on file    Attends religious service: Not on file    Active member of club or organization: Not on file    Attends meetings of clubs or organizations: Not on file    Relationship status: Not on file  Other Topics Concern  . Not on file  Social History Narrative  . Not on file     Family History: The patient's family history includes Cancer in his brother and sister; Cancer (age of onset: 3) in his mother; Deep vein thrombosis in his mother; Diabetes in his father and mother; Heart attack in his father and mother; Heart disease in his mother; Hyperlipidemia in his mother; Hypertension in his mother; Other in his mother and sister; Stroke in his mother; Varicose Veins in his mother. There is no history of Colon cancer or Esophageal cancer. ROS:   Please see the history of present illness.    All 14 point review of systems negative except as described per history of present illness  EKGs/Labs/Other Studies Reviewed:      Recent Labs: 11/21/2018: ALT 15  Recent Lipid Panel    Component Value Date/Time   CHOL 159 11/21/2018 0836   TRIG 82 11/21/2018 0836   HDL 59 11/21/2018 0836   CHOLHDL 2.7 11/21/2018 0836   LDLCALC 84 11/21/2018 0836    Physical Exam:    VS:  BP (!) 114/56   Pulse (!) 59   Ht 5\' 9"  (1.753 m)   Wt 190 lb 6.4 oz (86.4 kg)   SpO2 96%   BMI 28.12 kg/m     Wt Readings from Last 3 Encounters:  05/30/19 190 lb 6.4 oz (86.4 kg)  04/11/19 190 lb 12.8 oz (86.5 kg)  02/26/19 193 lb (87.5 kg)     GEN:  Well nourished, well developed in no acute distress HEENT: Normal NECK: No JVD; No carotid bruits LYMPHATICS: No lymphadenopathy CARDIAC: RRR, no murmurs, no rubs, no gallops RESPIRATORY:  Clear to auscultation without rales, wheezing or rhonchi  ABDOMEN: Soft, non-tender, non-distended MUSCULOSKELETAL:  No edema; No deformity  SKIN: Warm and dry LOWER  EXTREMITIES: no swelling NEUROLOGIC:  Alert and oriented x 3 PSYCHIATRIC:  Normal affect   ASSESSMENT:    1. Bradycardia   2. Dyslipidemia   3. Essential hypertension    PLAN:  In order of problems listed above:  1. Bradycardia EKG done today shows sinus bradycardia with APCs.  I will ask him to wear monitor for week to see how slow his heart rate goes and see what chronotropic responses. 2. Weakness and fatigue echocardiogram will be done to reassess left ventricular ejection fraction. 3. Cardiac arterial disease.  Recent carotid ultrasound showed no critical lesion. 4. Essential hypertension blood pressure well controlled continue present management.   Medication Adjustments/Labs and Tests Ordered: Current medicines are reviewed at length with the patient today.  Concerns regarding medicines are outlined above.  No orders of the defined types were placed in this encounter.  Medication changes: No orders of the defined types were placed in this encounter.   Signed, Park Liter, MD, [facility] 05/30/2019 11:55 AM    [place]

## 2019-05-30 NOTE — Patient Instructions (Signed)
Medication Instructions:  Your physician recommends that you continue on your current medications as directed. Please refer to the Current Medication list given to you today.  If you need a refill on your cardiac medications before your next appointment, please call your pharmacy.   Lab work: None.  If you have labs (blood work) drawn today and your tests are completely normal, you will receive your results only by: [name] MyChart Message (if you have MyChart) OR . A paper copy in the mail If you have any lab test that is abnormal or we need to change your treatment, we will call you to review the results.  Testing/Procedures: Your physician has requested that you have an echocardiogram. Echocardiography is a painless test that uses sound waves to create images of your heart. It provides your doctor with information about the size and shape of your heart and how well your heart's chambers and valves are working. This procedure takes approximately one hour. There are no restrictions for this procedure.  Your physician has recommended that you wear a holter monitor. Holter monitors are medical devices that record the heart's electrical activity. Doctors most often use these monitors to diagnose arrhythmias. Arrhythmias are problems with the speed or rhythm of the heartbeat. The monitor is a small, portable device. You can wear one while you do your normal daily activities. This is usually used to diagnose what is causing palpitations/syncope (passing out). Wear for 7 days.     Follow-Up: At [facility], you and your health needs are our priority.  As part of our continuing mission to provide you with exceptional heart care, we have created designated Provider Care Teams.  These Care Teams include your primary Cardiologist (physician) and Advanced Practice Providers (APPs -  Physician Assistants and Nurse Practitioners) who all work together to provide you with the care you need, when you need it.  You will need a follow up appointment in 2 months.  Please call our office 2 months in advance to schedule this appointment.  You may see Jenne Campus, MD or another member of our [place] Provider Team in [place]: Shirlee More, MD . Jyl Heinz, MD  Any Other Special Instructions Will Be Listed Below (If Applicable).   Echocardiogram An echocardiogram is a procedure that uses painless sound waves (ultrasound) to produce an image of the heart. Images from an echocardiogram can provide important information about:  Signs of coronary artery disease (CAD).  Aneurysm detection. An aneurysm is a weak or damaged part of an artery wall that bulges out from the normal force of blood pumping through the body.  Heart size and shape. Changes in the size or shape of the heart can be associated with certain conditions, including heart failure, aneurysm, and CAD.  Heart muscle function.  Heart valve function.  Signs of a past heart attack.  Fluid buildup around the heart.  Thickening of the heart muscle.  A tumor or infectious growth around the heart valves. Tell a health care provider about:  Any allergies you have.  All medicines you are taking, including vitamins, herbs, eye drops, creams, and over-the-counter medicines.  Any blood disorders you have.  Any surgeries you have had.  Any medical conditions you have.  Whether you are pregnant or may be pregnant. What are the risks? Generally, this is a safe procedure. However, problems may occur, including:  Allergic reaction to dye (contrast) that may be used during the procedure. What happens before the procedure? No specific preparation  is needed. You may eat and drink normally. What happens during the procedure?   An IV tube may be inserted into one of your veins.  You may receive contrast through this tube. A contrast is an injection that improves the quality of the pictures from your heart.  A gel will be  applied to your chest.  A wand-like tool (transducer) will be moved over your chest. The gel will help to transmit the sound waves from the transducer.  The sound waves will harmlessly bounce off of your heart to allow the heart images to be captured in real-time motion. The images will be recorded on a computer. The procedure may vary among health care providers and hospitals. What happens after the procedure?  You may return to your normal, everyday life, including diet, activities, and medicines, unless your health care provider tells you not to do that. Summary  An echocardiogram is a procedure that uses painless sound waves (ultrasound) to produce an image of the heart.  Images from an echocardiogram can provide important information about the size and shape of your heart, heart muscle function, heart valve function, and fluid buildup around your heart.  You do not need to do anything to prepare before this procedure. You may eat and drink normally.  After the echocardiogram is completed, you may return to your normal, everyday life, unless your health care provider tells you not to do that. This information is not intended to replace advice given to you by your health care provider. Make sure you discuss any questions you have with your health care provider. Document Released: 09/09/2000 Document Revised: 01/03/2019 Document Reviewed: 10/15/2016 Elsevier Patient Education  2020 [employer].

## 2019-06-04 ENCOUNTER — Other Ambulatory Visit: Payer: Self-pay

## 2019-06-04 ENCOUNTER — Ambulatory Visit: Payer: Medicare Other | Admitting: Cardiology

## 2019-06-04 ENCOUNTER — Ambulatory Visit (INDEPENDENT_AMBULATORY_CARE_PROVIDER_SITE_OTHER): Payer: Medicare Other

## 2019-06-04 DIAGNOSIS — I10 Essential (primary) hypertension: Secondary | ICD-10-CM

## 2019-06-04 DIAGNOSIS — E785 Hyperlipidemia, unspecified: Secondary | ICD-10-CM | POA: Diagnosis not present

## 2019-06-04 DIAGNOSIS — R001 Bradycardia, unspecified: Secondary | ICD-10-CM

## 2019-06-04 NOTE — Progress Notes (Signed)
Complete echocardiogram has been performed.  Jimmy Lavena Loretto RDCS, RVT 

## 2019-06-10 ENCOUNTER — Ambulatory Visit (INDEPENDENT_AMBULATORY_CARE_PROVIDER_SITE_OTHER): Payer: Medicare Other

## 2019-06-10 DIAGNOSIS — R001 Bradycardia, unspecified: Secondary | ICD-10-CM

## 2019-06-10 DIAGNOSIS — E785 Hyperlipidemia, unspecified: Secondary | ICD-10-CM | POA: Diagnosis not present

## 2019-06-28 ENCOUNTER — Telehealth: Payer: Self-pay | Admitting: Cardiology

## 2019-06-28 NOTE — Telephone Encounter (Signed)
Wants to know how his monitoring turned out

## 2019-07-02 DIAGNOSIS — C3431 Malignant neoplasm of lower lobe, right bronchus or lung: Secondary | ICD-10-CM | POA: Diagnosis not present

## 2019-07-02 DIAGNOSIS — D696 Thrombocytopenia, unspecified: Secondary | ICD-10-CM | POA: Diagnosis not present

## 2019-07-17 ENCOUNTER — Ambulatory Visit: Payer: Medicare Other | Admitting: Cardiology

## 2019-08-06 ENCOUNTER — Ambulatory Visit (INDEPENDENT_AMBULATORY_CARE_PROVIDER_SITE_OTHER): Payer: Medicare Other | Admitting: Cardiology

## 2019-08-06 ENCOUNTER — Encounter: Payer: Self-pay | Admitting: Cardiology

## 2019-08-06 ENCOUNTER — Other Ambulatory Visit: Payer: Self-pay

## 2019-08-06 VITALS — BP 106/64 | HR 52 | Ht 68.5 in | Wt 193.2 lb

## 2019-08-06 DIAGNOSIS — R001 Bradycardia, unspecified: Secondary | ICD-10-CM

## 2019-08-06 DIAGNOSIS — R0789 Other chest pain: Secondary | ICD-10-CM

## 2019-08-06 DIAGNOSIS — E785 Hyperlipidemia, unspecified: Secondary | ICD-10-CM | POA: Diagnosis not present

## 2019-08-06 DIAGNOSIS — I10 Essential (primary) hypertension: Secondary | ICD-10-CM | POA: Diagnosis not present

## 2019-08-06 DIAGNOSIS — I6523 Occlusion and stenosis of bilateral carotid arteries: Secondary | ICD-10-CM | POA: Diagnosis not present

## 2019-08-06 NOTE — Patient Instructions (Signed)

## 2019-08-06 NOTE — Progress Notes (Signed)
Cardiology Office Note:    Date:  08/06/2019   ID:  Louis Barton, DOB June 04, 1941, MRN [id number]  PCP:  Raina Mina., MD  Cardiologist:  Jenne Campus, MD    Referring MD: Raina Mina., MD   Chief Complaint  Patient presents with  . 2 month follow up    History of Present Illness:    Louis Barton is a 78 y.o. male with peripheral vascular disease, carotic arterial stenosis, essential hypertension, dyslipidemia, bradycardia last time he spoke to him he complained of being weak tired exhausted with a few test done to clarify this.  He did wear monitor which did not show critical bradycardia however did show some episode of SVT.  Today he tells me that sometimes he wakes up in the middle of the night and his heart can be speeding up.  Does not bother him much and we talked about potentially treating it with some understanding that beta-blocker is probably not the best choice because of his bradycardia.  However concentrated blocker can be used for this situation he does not want to change any medication he said he is doing fine with that he had it for years and it does not bother him.  He also had echocardiogram which showed preserved left ventricle ejection fraction.  He signed up with gym and ready to go back there.  I encouraged him to exercise on a regular basis.  Past Medical History:  Diagnosis Date  . Arthritis    DDD- aging   . CAD (coronary artery disease)   . Cancer ([place])    squamous & basal cell - both have been addressed - arm & leg & nose   . Carotid stenosis   . Chronic kidney disease    cysts on both kidneys, seeing [name] in [place], [facility] [place]   . History of hiatal hernia    seen on last scan- as slight   . History of thrombocytopenia   . History of TIAs   . Hyperlipidemia   . Hypertension   . PAD (peripheral artery disease) ([place])   . Shingles    internal- obstruction of bowels & gallbladder  . Stroke [facility])     Past Surgical History:   Procedure Laterality Date  . adenocarcinoma  2019   Removed  . CAROTID ENDARTERECTOMY  2005   Left carotid by Dr. Oneida Alar  . CAROTID ENDARTERECTOMY  04/25/11   Right Carotid by Dr. Oneida Alar  . COLONOSCOPY  02/23/2017   Colonic polyp status post polypectomy. Pancolonic diverticulosis predominatly in the sigmoid colon. Tubular adenoma  . Septoplasty, nasal w/ submucosal resection  1960  . SQUAMOUS CELL CARCINOMA EXCISION     pt said numerous times  . TONSILLECTOMY    . VIDEO ASSISTED THORACOSCOPY (VATS)/WEDGE RESECTION Right 11/09/2017   Procedure: RIGHT VIDEO ASSISTED THORACOSCOPY WITH Chales Salmon RESECTION;  Surgeon: [name], MD;  Location: [place];  Service: Thoracic;  Laterality: Right;    Current Medications: Current Meds  Medication Sig  . acetaminophen (TYLENOL) 500 MG tablet Take 1,000 mg by mouth every 6 (six) hours as needed (for pain.).  [name] aspirin EC 81 MG tablet Take 81 mg by mouth daily.  . clopidogrel (PLAVIX) 75 MG tablet Take 75 mg by mouth at bedtime.   . colesevelam (WELCHOL) 625 MG tablet Take 1,250 mg by mouth 2 (two) times daily with a meal.   . hydrochlorothiazide (HYDRODIURIL) 25 MG tablet Take 25 mg by mouth daily.   [name]  losartan (COZAAR) 100 MG tablet Take 100 mg by mouth daily.   . rosuvastatin (CRESTOR) 40 MG tablet Take 1 tablet (40 mg total) by mouth daily. (Patient taking differently: Take 40 mg by mouth at bedtime. )  . valACYclovir (VALTREX) 1000 MG tablet Take 1,000 mg by mouth 2 (two) times daily as needed (for fever blisters.).      Allergies:   Patient has no known allergies.   Social History   Socioeconomic History  . Marital status: Married    Spouse name: Not on file  . Number of children: 2  . Years of education: Not on file  . Highest education level: Not on file  Occupational History  . Not on file  Social Needs  . Financial resource strain: Not on file  . Food insecurity    Worry: Not on file    Inability: Not on file  .  Transportation needs    Medical: Not on file    Non-medical: Not on file  Tobacco Use  . Smoking status: Former Smoker    Packs/day: 1.00    Years: 16.00    Pack years: 16.00    Types: Cigarettes    Quit date: 09/26/1972    Years since quitting: 46.8  . Smokeless tobacco: Never Used  Substance and Sexual Activity  . Alcohol use: Yes    Alcohol/week: 2.0 standard drinks    Types: 2 Glasses of wine per week    Comment:  2-3 Drinks of Scotch per Day  . Drug use: No  . Sexual activity: Not on file  Lifestyle  . Physical activity    Days per week: Not on file    Minutes per session: Not on file  . Stress: Not on file  Relationships  . Social Herbalist on phone: Not on file    Gets together: Not on file    Attends religious service: Not on file    Active member of club or organization: Not on file    Attends meetings of clubs or organizations: Not on file    Relationship status: Not on file  Other Topics Concern  . Not on file  Social History Narrative  . Not on file     Family History: The patient's family history includes Cancer in his brother and sister; Cancer (age of onset: 63) in his mother; Deep vein thrombosis in his mother; Diabetes in his father and mother; Heart attack in his father and mother; Heart disease in his mother; Hyperlipidemia in his mother; Hypertension in his mother; Other in his mother and sister; Stroke in his mother; Varicose Veins in his mother. There is no history of Colon cancer or Esophageal cancer. ROS:   Please see the history of present illness.    All 14 point review of systems negative except as described per history of present illness  EKGs/Labs/Other Studies Reviewed:      Recent Labs: 11/21/2018: ALT 15  Recent Lipid Panel    Component Value Date/Time   CHOL 159 11/21/2018 0836   TRIG 82 11/21/2018 0836   HDL 59 11/21/2018 0836   CHOLHDL 2.7 11/21/2018 0836   LDLCALC 84 11/21/2018 0836    Physical Exam:    VS:  BP  106/64   Pulse (!) 52   Ht 5' 8.5" (1.74 m)   Wt 193 lb 3.2 oz (87.6 kg)   SpO2 97%   BMI 28.95 kg/m     Wt Readings from Last 3 Encounters:  08/06/19 193 lb 3.2 oz (87.6 kg)  05/30/19 190 lb 6.4 oz (86.4 kg)  04/11/19 190 lb 12.8 oz (86.5 kg)     GEN:  Well nourished, well developed in no acute distress HEENT: Normal NECK: No JVD; No carotid bruits LYMPHATICS: No lymphadenopathy CARDIAC: RRR, no murmurs, no rubs, no gallops RESPIRATORY:  Clear to auscultation without rales, wheezing or rhonchi  ABDOMEN: Soft, non-tender, non-distended MUSCULOSKELETAL:  No edema; No deformity  SKIN: Warm and dry LOWER EXTREMITIES: no swelling NEUROLOGIC:  Alert and oriented x 3 PSYCHIATRIC:  Normal affect   ASSESSMENT:    1. Bilateral carotid artery stenosis   2. Essential hypertension   3. Dyslipidemia   4. Bradycardia   5. Atypical chest pain    PLAN:    In order of problems listed above:  1. Bilateral carotid artery stenosis last carotid ultrasound reviewed did not show any critical lesions 2. Essential hypertension blood pressure well controlled continue present management. 3. Dyslipidemia followed by primary care physician stable 4. Bradycardia mechanism is sinus, no critical continue present management 5. Atypical chest pain denies having any   Medication Adjustments/Labs and Tests Ordered: Current medicines are reviewed at length with the patient today.  Concerns regarding medicines are outlined above.  No orders of the defined types were placed in this encounter.  Medication changes: No orders of the defined types were placed in this encounter.   Signed, Park Liter, MD, [facility] 08/06/2019 8:49 AM    [place]

## 2019-10-16 ENCOUNTER — Ambulatory Visit (INDEPENDENT_AMBULATORY_CARE_PROVIDER_SITE_OTHER): Payer: Medicare Other | Admitting: Cardiology

## 2019-10-16 ENCOUNTER — Encounter: Payer: Self-pay | Admitting: Cardiology

## 2019-10-16 ENCOUNTER — Other Ambulatory Visit: Payer: Self-pay

## 2019-10-16 VITALS — BP 112/68 | HR 65 | Ht 68.5 in | Wt 190.0 lb

## 2019-10-16 DIAGNOSIS — E782 Mixed hyperlipidemia: Secondary | ICD-10-CM

## 2019-10-16 DIAGNOSIS — R001 Bradycardia, unspecified: Secondary | ICD-10-CM | POA: Diagnosis not present

## 2019-10-16 DIAGNOSIS — R0789 Other chest pain: Secondary | ICD-10-CM

## 2019-10-16 DIAGNOSIS — I6523 Occlusion and stenosis of bilateral carotid arteries: Secondary | ICD-10-CM | POA: Diagnosis not present

## 2019-10-16 NOTE — Progress Notes (Signed)
Cardiology Office Note:    Date:  10/16/2019   ID:  [name], DOB 1941-06-02, MRN [id number]  PCP:  Raina Mina., MD  Cardiologist:  Jenne Campus, MD    Referring MD: Raina Mina., MD   Chief Complaint  Patient presents with  . Follow-up    History of Present Illness:    Louis Barton is a 79 y.o. male with past medical history significant for palpitations, dyslipidemia, peripheral vascular disease.  Comes today to my office for follow-up.  Recently we did to monitor we identified some PVCs and APCs but no sustained arrhythmia.  Recently he ended up having COVID-19 infection he did quite well with that.  But she still recovering from it.  We also did recently carotic ultrasounds which showed no significant cardiac arterial stenosis.  He also got echocardiogram which showed preserved left ventricle ejection fraction.  Overall seems to be doing well.  Gradually try to get his strength better after coronavirus infection.  Past Medical History:  Diagnosis Date  . Aftercare following surgery of the circulatory system, [place] 12/19/2013  . Arthritis    DDD- aging   . Atherosclerosis 11/16/2015  . Atypical chest pain 06/11/2015  . Benign paroxysmal positional vertigo 08/08/2016  . Bradycardia 03/18/2015  . CAD (coronary artery disease)   . 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 Carotid stenosis   . Chronic kidney disease    cysts on both kidneys, seeing [name] in [place], [facility] [place]   . Cramps of left lower extremity-Calf  > right calf 01/01/2015  . Dyslipidemia 03/18/2015  . Ear itch 05/14/2018  . Edema 11/16/2015  . History of hiatal hernia    seen on last scan- as slight   . History of thrombocytopenia   . History of TIAs   . Hyperlipidemia   . Hypertension   . Irritable bowel syndrome 11/17/2016   rec trial of citrucel/ diet 11/16/2016 >>>     . Local edema 02/07/2018  . 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 [place]. And Dr. [name]  . 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 Occlusion and stenosis of carotid artery without mention of cerebral infarction 12/01/2011  . PAD (peripheral artery disease) ([place])   . Prediabetes 12/25/2018  . Preop cardiovascular exam 04/14/2011  . Right lower lobe pulmonary nodule 11/09/2017  . Shingles    internal- obstruction of bowels & gallbladder  . Stroke ([place])   . Swelling of limb-Left foot 01/01/2015  . Vitamin B12 deficiency 06/21/2017    Past Surgical History:  Procedure Laterality Date  . adenocarcinoma  2019   Removed  . CAROTID ENDARTERECTOMY  2005   Left carotid by Dr. Oneida Alar  . CAROTID ENDARTERECTOMY  04/25/11   Right Carotid by Dr. Oneida Alar  . COLONOSCOPY  02/23/2017   Colonic polyp status post polypectomy. Pancolonic diverticulosis predominatly in the sigmoid colon. Tubular adenoma  . Septoplasty, nasal w/ submucosal resection  1960  . SQUAMOUS CELL CARCINOMA EXCISION     pt said numerous times  . TONSILLECTOMY    . VIDEO ASSISTED THORACOSCOPY (VATS)/WEDGE RESECTION Right 11/09/2017   Procedure: RIGHT VIDEO ASSISTED THORACOSCOPY WITH Chales Salmon RESECTION;  Surgeon: [name], MD;  Location: [facility];  Service: Thoracic;  Laterality: Right;    Current Medications: Current Meds  Medication Sig  . acetaminophen (TYLENOL) 500 MG tablet Take 1,000  mg by mouth every 6 (six) hours as needed (for pain.).  [name] aspirin EC 81 MG tablet Take 81 mg by mouth daily.  . clopidogrel (PLAVIX) 75 MG tablet Take 75 mg by mouth at bedtime.   . colesevelam (WELCHOL) 625 MG tablet Take 1,250 mg by mouth 2 (two) times daily with a meal.   . hydrochlorothiazide (HYDRODIURIL) 25 MG tablet Take 25 mg by mouth daily.   [name] losartan (COZAAR) 100 MG tablet Take 100 mg by mouth daily.   . rosuvastatin (CRESTOR) 40 MG tablet Take 1 tablet (40 mg total) by mouth daily. (Patient taking differently: Take 40 mg by mouth at bedtime. )  . valACYclovir (VALTREX) 1000 MG tablet  Take 1,000 mg by mouth 2 (two) times daily as needed (for fever blisters.).      Allergies:   Patient has no known allergies.   Social History   Socioeconomic History  . Marital status: Married    Spouse name: Not on file  . Number of children: 2  . Years of education: Not on file  . Highest education level: Not on file  Occupational History  . Not on file  Tobacco Use  . Smoking status: Former Smoker    Packs/day: 1.00    Years: 16.00    Pack years: 16.00    Types: Cigarettes    Quit date: 09/26/1972    Years since quitting: 47.0  . Smokeless tobacco: Never Used  Substance and Sexual Activity  . Alcohol use: Yes    Alcohol/week: 2.0 standard drinks    Types: 2 Glasses of wine per week    Comment:  2-3 Drinks of Scotch per Day  . Drug use: No  . Sexual activity: Not on file  Other Topics Concern  . Not on file  Social History Narrative  . Not on file   Social Determinants of Health   Financial Resource Strain:   . Difficulty of Paying Living Expenses: Not on file  Food Insecurity:   . Worried About Charity fundraiser in the Last Year: Not on file  . Ran Out of Food in the Last Year: Not on file  Transportation Needs:   . Lack of Transportation (Medical): Not on file  . Lack of Transportation (Non-Medical): Not on file  Physical Activity:   . Days of Exercise per Week: Not on file  . Minutes of Exercise per Session: Not on file  Stress:   . Feeling of Stress : Not on file  Social Connections:   . Frequency of Communication with Friends and Family: Not on file  . Frequency of Social Gatherings with Friends and Family: Not on file  . Attends Religious Services: Not on file  . Active Member of Clubs or Organizations: Not on file  . Attends Archivist Meetings: Not on file  . Marital Status: Not on file     Family History: The patient's family history includes Cancer in his brother and sister; Cancer (age of onset: 48) in his mother; Deep vein  thrombosis in his mother; Diabetes in his father and mother; Heart attack in his father and mother; Heart disease in his mother; Hyperlipidemia in his mother; Hypertension in his mother; Other in his mother and sister; Stroke in his mother; Varicose Veins in his mother. There is no history of Colon cancer or Esophageal cancer. ROS:   Please see the history of present illness.    All 14 point review of systems negative except as described  per history of present illness  EKGs/Labs/Other Studies Reviewed:      Recent Labs: 11/21/2018: ALT 15  Recent Lipid Panel    Component Value Date/Time   CHOL 159 11/21/2018 0836   TRIG 82 11/21/2018 0836   HDL 59 11/21/2018 0836   CHOLHDL 2.7 11/21/2018 0836   LDLCALC 84 11/21/2018 0836    Physical Exam:    VS:  BP 112/68 (BP Location: Left Arm, Patient Position: Sitting, Cuff Size: Normal)   Pulse 65   Ht 5' 8.5" (1.74 m)   Wt 190 lb (86.2 kg)   SpO2 95%   BMI 28.47 kg/m     Wt Readings from Last 3 Encounters:  10/16/19 190 lb (86.2 kg)  08/06/19 193 lb 3.2 oz (87.6 kg)  05/30/19 190 lb 6.4 oz (86.4 kg)     GEN:  Well nourished, well developed in no acute distress HEENT: Normal NECK: No JVD; No carotid bruits LYMPHATICS: No lymphadenopathy CARDIAC: RRR, no murmurs, no rubs, no gallops RESPIRATORY:  Clear to auscultation without rales, wheezing or rhonchi  ABDOMEN: Soft, non-tender, non-distended MUSCULOSKELETAL:  No edema; No deformity  SKIN: Warm and dry LOWER EXTREMITIES: no swelling NEUROLOGIC:  Alert and oriented x 3 PSYCHIATRIC:  Normal affect   ASSESSMENT:    1. Bilateral carotid artery stenosis   2. Mixed hyperlipidemia   3. Bradycardia   4. Atypical chest pain    PLAN:    In order of problems listed above:  1. Bilateral carotid arterial stenosis last checked in the summer without significant stenosis we will repeat within next few months. 2. Dyslipidemia he is on statin Crestor 40 mg which is high intensity  statin we will continue present management. 3. Bradycardia: Not significant based on last monitor that was done just few months ago. 4. Atypical chest pain denies having any.   Medication Adjustments/Labs and Tests Ordered: Current medicines are reviewed at length with the patient today.  Concerns regarding medicines are outlined above.  No orders of the defined types were placed in this encounter.  Medication changes: No orders of the defined types were placed in this encounter.   Signed, Park Liter, MD, [facility] 10/16/2019 9:03 AM    [place]

## 2019-10-16 NOTE — Patient Instructions (Signed)

## 2020-01-28 ENCOUNTER — Telehealth: Payer: Self-pay | Admitting: Emergency Medicine

## 2020-01-28 NOTE — Telephone Encounter (Signed)
Patient called in. He reports that he noticed his lower extremities swelling last night. He reports there is indentation when he presses on his legs. He denies shortness of breath, he denies pain or redness to the site. He is still taking hydrochlorothiazide 25 mg daily. He is unsure if he has had any weight gain.

## 2020-01-28 NOTE — Telephone Encounter (Signed)
Let's wait and see what wait and swelling will do

## 2020-01-29 NOTE — Telephone Encounter (Signed)
Called patient. He reports swelling is better today. He will call us if anything changes or gets worse.

## 2020-02-03 DIAGNOSIS — I872 Venous insufficiency (chronic) (peripheral): Secondary | ICD-10-CM

## 2020-02-03 HISTORY — DX: Venous insufficiency (chronic) (peripheral): I87.2

## 2020-02-05 DIAGNOSIS — D696 Thrombocytopenia, unspecified: Secondary | ICD-10-CM | POA: Diagnosis not present

## 2020-02-05 DIAGNOSIS — C3431 Malignant neoplasm of lower lobe, right bronchus or lung: Secondary | ICD-10-CM | POA: Diagnosis not present

## 2020-03-03 ENCOUNTER — Ambulatory Visit: Payer: Medicare Other | Admitting: Thoracic Surgery (Cardiothoracic Vascular Surgery)

## 2020-03-27 ENCOUNTER — Other Ambulatory Visit: Payer: Self-pay | Admitting: Thoracic Surgery (Cardiothoracic Vascular Surgery)

## 2020-03-27 DIAGNOSIS — C3492 Malignant neoplasm of unspecified part of left bronchus or lung: Secondary | ICD-10-CM

## 2020-03-31 ENCOUNTER — Ambulatory Visit (INDEPENDENT_AMBULATORY_CARE_PROVIDER_SITE_OTHER): Payer: Medicare Other | Admitting: Thoracic Surgery (Cardiothoracic Vascular Surgery)

## 2020-03-31 ENCOUNTER — Encounter: Payer: Self-pay | Admitting: Thoracic Surgery (Cardiothoracic Vascular Surgery)

## 2020-03-31 ENCOUNTER — Ambulatory Visit
Admission: RE | Admit: 2020-03-31 | Discharge: 2020-03-31 | Disposition: A | Discharge status: Home / Self Care | Payer: Medicare Other | Source: Ambulatory Visit | Attending: Thoracic Surgery (Cardiothoracic Vascular Surgery) | Admitting: Thoracic Surgery (Cardiothoracic Vascular Surgery)

## 2020-03-31 ENCOUNTER — Other Ambulatory Visit: Payer: Self-pay

## 2020-03-31 VITALS — BP 118/73 | HR 65 | Temp 97.5°F | Resp 20 | Ht 68.5 in | Wt 187.0 lb

## 2020-03-31 DIAGNOSIS — I6523 Occlusion and stenosis of bilateral carotid arteries: Secondary | ICD-10-CM | POA: Diagnosis not present

## 2020-03-31 DIAGNOSIS — C3491 Malignant neoplasm of unspecified part of right bronchus or lung: Secondary | ICD-10-CM

## 2020-03-31 DIAGNOSIS — C3492 Malignant neoplasm of unspecified part of left bronchus or lung: Secondary | ICD-10-CM

## 2020-03-31 NOTE — Progress Notes (Signed)
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  I last saw him in the office in June 2020.  Recently he has been having some issues with shortness of breath and leg swelling.  He has an appointment with [name] tomorrow to further investigate that.  He had Covid back in the fall.  His wife is very ill and was hospitalized for 6 days.  He says he is never quite recovered his strength after having Covid.  He complains of some muscle weakness in his thighs and shortness of breath with exertion.  He is also noted swelling in his legs which has worsened recently.  He saw Dr. Hinton Rao in May.  A CT showed no evidence of recurrent disease.  Past Medical History:  Diagnosis Date  . Aftercare following surgery of the circulatory system, [place] 12/19/2013  . Arthritis    DDD- aging   . Atherosclerosis 11/16/2015  . Atypical chest pain 06/11/2015  . Benign paroxysmal positional vertigo 08/08/2016  . Bradycardia 03/18/2015  . CAD (coronary artery disease)   . 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 Carotid stenosis   . Chronic kidney disease    cysts on both kidneys, seeing [name] in [place], [facility] [place]   . Cramps of left lower extremity-Calf  > right calf 01/01/2015  . Dyslipidemia 03/18/2015  . Ear itch 05/14/2018  . Edema 11/16/2015  . History of hiatal hernia    seen on last scan- as slight   . History of thrombocytopenia   . History of TIAs   . Hyperlipidemia   . Hypertension   . Irritable bowel syndrome 11/17/2016   rec trial of citrucel/ diet 11/16/2016 >>>     . Local edema 02/07/2018  . Malignant neoplasm of left lung ([place]) 12/08/2017   Stage 1A. [place]. And  Dr. [name]  . 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 Occlusion and stenosis of carotid artery without mention of cerebral infarction 12/01/2011  . PAD (peripheral artery disease) ([place])   . Prediabetes 12/25/2018  . Preop cardiovascular exam 04/14/2011  . Right lower lobe pulmonary nodule 11/09/2017  . Shingles    internal- obstruction of bowels & gallbladder  . Stroke ([place])   . Swelling of limb-Left foot 01/01/2015  . Vitamin B12 deficiency 06/21/2017    Current Outpatient Medications  Medication Sig Dispense Refill  . acetaminophen (TYLENOL) 500 MG tablet Take 1,000 mg by mouth every 6 (six) hours as needed (for pain.).    [name] aspirin EC 81 MG tablet Take 81 mg by mouth daily.    . clopidogrel (PLAVIX) 75 MG tablet Take 75 mg by mouth at bedtime.     . colesevelam (WELCHOL) 625 MG tablet Take 1,250 mg by mouth 2 (two) times daily with a meal.     . hydrochlorothiazide (HYDRODIURIL) 25 MG tablet Take 25 mg by mouth daily.     [name] losartan (COZAAR) 100 MG tablet Take 100 mg by mouth daily.     . rosuvastatin (CRESTOR) 40 MG tablet Take 1  tablet (40 mg total) by mouth daily. (Patient taking differently: Take 40 mg by mouth at bedtime. ) 30 tablet 6  . valACYclovir (VALTREX) 1000 MG tablet Take 1,000 mg by mouth 2 (two) times daily as needed (for fever blisters.).      No current facility-administered medications for this visit.    Physical Exam BP 118/73 (BP Location: Right Arm, Patient Position: Sitting, Cuff Size: Normal)   Pulse 65   Temp (!) 97.5 F (36.4 C) (Temporal)   Resp 20   Ht 5' 8.5" (1.74 m)   Wt 187 lb (84.8 kg)   SpO2 97% Comment: RA  BMI 28.2 kg/m  79 year old man in no acute distress Alert and oriented x3 with no focal neurologic deficit Lungs diminished at right base, otherwise clear with no rales or wheezing Cardiac slightly irregular, no murmur Extremities 3+ edema both lower extremities  Diagnostic Tests: CHEST - 2 VIEW  COMPARISON:  Chest x-ray  08/11/2019.  FINDINGS: Opacity in the right base which may reflect areas of atelectasis and/or scarring, very similar to the prior examination. Small right pleural effusion. Left lung is clear. No left pleural effusion. No evidence of pulmonary edema. Heart size is normal. Upper mediastinal contours are within normal limits. Aortic atherosclerosis.  IMPRESSION: 1. The appearance the chest is very similar to prior examinations, with what appears to be chronic scarring and/or atelectasis in the right lung base with small chronic right pleural effusion. 2. Aortic atherosclerosis.   Electronically Signed   By: Vinnie Langton M.D.   On: 03/31/2020 11:12 I personally reviewed the chest x-ray images and concur with the findings noted above  Impression: Louis Barton is an 79 year old man who had a thoracoscopic right lower lobe basilar segmentectomy for stage Ia adenocarcinoma in 2019.  He is now almost 2-1/2 years out from surgery with no evidence of recurrent disease.  He is being followed by Dr. Hinton Rao in [place].    He is having some shortness of breath.  He also has some significant peripheral edema.  I suspect he needs to be on a diuretic even though there is no significant pulmonary edema on his chest x-ray.  He is seeing [name] tomorrow so I will defer that decision to him.  His chest x-ray is essentially unchanged from a year ago so I do not think it is a primary pulmonary issue.  At this point he is being followed by Dr. Cheron Schaumann, Dr. Hinton Rao and [name].  I am not adding any thing significant.  I will be happy to see him back anytime if I can be of assistance with his care but will not schedule him for a follow-up at this point.  Plan: Follow-up tomorrow with [name] Continue to follow-up with Dr. Hinton Rao as scheduled I will be happy to see Louis Barton back at anytime in the future if I can be of any assistance with his care  [name], MD [facility] [phone]

## 2020-04-02 ENCOUNTER — Other Ambulatory Visit: Payer: Self-pay

## 2020-04-02 ENCOUNTER — Ambulatory Visit (INDEPENDENT_AMBULATORY_CARE_PROVIDER_SITE_OTHER): Payer: Medicare Other | Admitting: Cardiology

## 2020-04-02 ENCOUNTER — Encounter: Payer: Self-pay | Admitting: Cardiology

## 2020-04-02 VITALS — BP 112/70 | HR 57 | Ht 68.5 in | Wt 189.0 lb

## 2020-04-02 DIAGNOSIS — R0609 Other forms of dyspnea: Secondary | ICD-10-CM

## 2020-04-02 DIAGNOSIS — E785 Hyperlipidemia, unspecified: Secondary | ICD-10-CM

## 2020-04-02 DIAGNOSIS — R0600 Dyspnea, unspecified: Secondary | ICD-10-CM | POA: Diagnosis not present

## 2020-04-02 DIAGNOSIS — I6523 Occlusion and stenosis of bilateral carotid arteries: Secondary | ICD-10-CM | POA: Diagnosis not present

## 2020-04-02 DIAGNOSIS — I10 Essential (primary) hypertension: Secondary | ICD-10-CM

## 2020-04-02 HISTORY — DX: Dyspnea, unspecified: R06.00

## 2020-04-02 HISTORY — DX: Other forms of dyspnea: R06.09

## 2020-04-02 NOTE — Patient Instructions (Signed)
Medication Instructions:  Your physician recommends that you continue on your current medications as directed. Please refer to the Current Medication list given to you today.  *If you need a refill on your cardiac medications before your next appointment, please call your pharmacy*   Lab Work: Your physician recommends that you return for lab work today: bmp, pro bnp, ddimer  If you have labs (blood work) drawn today and your tests are completely normal, you will receive your results only by: [name] MyChart Message (if you have MyChart) OR . A paper copy in the mail If you have any lab test that is abnormal or we need to change your treatment, we will call you to review the results.   Testing/Procedures: Your physician has requested that you have an echocardiogram. Echocardiography is a painless test that uses sound waves to create images of your heart. It provides your doctor with information about the size and shape of your heart and how well your heart's chambers and valves are working. This procedure takes approximately one hour. There are no restrictions for this procedure.     Follow-Up: At [facility], you and your health needs are our priority.  As part of our continuing mission to provide you with exceptional heart care, we have created designated Provider Care Teams.  These Care Teams include your primary Cardiologist (physician) and Advanced Practice Providers (APPs -  Physician Assistants and Nurse Practitioners) who all work together to provide you with the care you need, when you need it.  We recommend signing up for the patient portal called "MyChart".  Sign up information is provided on this After Visit Summary.  MyChart is used to connect with patients for Virtual Visits (Telemedicine).  Patients are able to view lab/test results, encounter notes, upcoming appointments, etc.  Non-urgent messages can be sent to your provider as well.   To learn more about what you can do with  MyChart, go to [email or web address].    Your next appointment:   1 month(s)  The format for your next appointment:   In Person  Provider:   Jenne Campus, MD   Other Instructions   Echocardiogram An echocardiogram is a procedure that uses painless sound waves (ultrasound) to produce an image of the heart. Images from an echocardiogram can provide important information about:  Signs of coronary artery disease (CAD).  Aneurysm detection. An aneurysm is a weak or damaged part of an artery wall that bulges out from the normal force of blood pumping through the body.  Heart size and shape. Changes in the size or shape of the heart can be associated with certain conditions, including heart failure, aneurysm, and CAD.  Heart muscle function.  Heart valve function.  Signs of a past heart attack.  Fluid buildup around the heart.  Thickening of the heart muscle.  A tumor or infectious growth around the heart valves. Tell a health care provider about:  Any allergies you have.  All medicines you are taking, including vitamins, herbs, eye drops, creams, and over-the-counter medicines.  Any blood disorders you have.  Any surgeries you have had.  Any medical conditions you have.  Whether you are pregnant or may be pregnant. What are the risks? Generally, this is a safe procedure. However, problems may occur, including:  Allergic reaction to dye (contrast) that may be used during the procedure. What happens before the procedure? No specific preparation is needed. You may eat and drink normally. What happens during the procedure?   An  IV tube may be inserted into one of your veins.  You may receive contrast through this tube. A contrast is an injection that improves the quality of the pictures from your heart.  A gel will be applied to your chest.  A wand-like tool (transducer) will be moved over your chest. The gel will help to transmit the sound waves from the  transducer.  The sound waves will harmlessly bounce off of your heart to allow the heart images to be captured in real-time motion. The images will be recorded on a computer. The procedure may vary among health care providers and hospitals. What happens after the procedure?  You may return to your normal, everyday life, including diet, activities, and medicines, unless your health care provider tells you not to do that. Summary  An echocardiogram is a procedure that uses painless sound waves (ultrasound) to produce an image of the heart.  Images from an echocardiogram can provide important information about the size and shape of your heart, heart muscle function, heart valve function, and fluid buildup around your heart.  You do not need to do anything to prepare before this procedure. You may eat and drink normally.  After the echocardiogram is completed, you may return to your normal, everyday life, unless your health care provider tells you not to do that. This information is not intended to replace advice given to you by your health care provider. Make sure you discuss any questions you have with your health care provider. Document Revised: 01/03/2019 Document Reviewed: 10/15/2016 Elsevier Patient Education  [place].

## 2020-04-02 NOTE — Progress Notes (Signed)
Cardiology Office Note:    Date:  04/02/2020   ID:  Louis Barton, DOB [birth year], MRN [id number]  PCP:  Raina Mina., MD  Cardiologist:  Jenne Campus, MD    Referring MD: Raina Mina., MD   No chief complaint on file. I am having shortness of breath and swollen legs  History of Present Illness:    Louis Barton is a 79 y.o. 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  Ongoing issue right now is profound fatigue swelling of lower extremities worse at evening time with worse left leg being swollen which is always mild case.  He also have shortness of breath while moving around and he just does not feel well.  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  Denies having any chest pain tightness squeezing pressure burning chest, he does not have paroxysmal nocturnal dyspnea.  Past Medical History:  Diagnosis Date  . Aftercare following surgery of the circulatory system, [place] 12/19/2013  . Arthritis    DDD- aging   . Atherosclerosis 11/16/2015  . Atypical chest pain 06/11/2015  . Benign paroxysmal positional vertigo 08/08/2016  . Bradycardia 03/18/2015  . CAD (coronary artery disease)   . 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 Carotid stenosis   . Chronic kidney disease    cysts on both kidneys, seeing [name] in [place], [facility] [place]   . Cramps of left lower extremity-Calf  > right calf 01/01/2015  . Dyslipidemia 03/18/2015  . Ear itch 05/14/2018  . Edema 11/16/2015  . History of hiatal hernia    seen on last scan- as slight   . History of thrombocytopenia   . History of TIAs   .  Hyperlipidemia   . Hypertension   . Irritable bowel syndrome 11/17/2016   rec trial of citrucel/ diet 11/16/2016 >>>     . Local edema 02/07/2018  . 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 [place]. And Dr. [name]  . 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 Occlusion and stenosis of carotid artery without mention of cerebral infarction 12/01/2011  . PAD (peripheral artery disease) ([place])   . Prediabetes 12/25/2018  . Preop cardiovascular exam 04/14/2011  . Right lower lobe pulmonary nodule 11/09/2017  . Shingles    internal- obstruction of bowels & gallbladder  . Stroke ([place])   . Swelling of limb-Left foot 01/01/2015  . Vitamin B12 deficiency 06/21/2017    Past Surgical History:  Procedure Laterality Date  . adenocarcinoma  2019   Removed  . CAROTID ENDARTERECTOMY  2005   Left carotid by Dr. Oneida Alar  . CAROTID ENDARTERECTOMY  04/25/11   Right Carotid by Dr. Oneida Alar  . COLONOSCOPY  02/23/2017   Colonic polyp status post polypectomy. Pancolonic diverticulosis predominatly in the sigmoid colon. Tubular adenoma  . Septoplasty, nasal w/ submucosal resection  1960  . SQUAMOUS CELL CARCINOMA EXCISION     pt said numerous times  . TONSILLECTOMY    . VIDEO ASSISTED THORACOSCOPY (VATS)/WEDGE RESECTION Right 11/09/2017   Procedure: RIGHT VIDEO ASSISTED THORACOSCOPY WITH /WEDGE RESECTION;  Surgeon: [name], MD;  Location: [facility] OR;  Service: Thoracic;  Laterality: Right;    Current Medications: Current Meds  Medication Sig  . aspirin EC 81 MG tablet Take 81 mg by mouth daily.  . clopidogrel (PLAVIX) 75 MG tablet Take 75 mg by mouth at bedtime.   . colesevelam (WELCHOL) 625 MG tablet Take 1,250 mg by mouth 2 (two) times daily with a meal.   . hydrochlorothiazide (HYDRODIURIL) 25 MG tablet Take 25 mg by mouth daily.   [name] losartan (COZAAR) 100 MG tablet Take 100 mg by mouth daily.   . rosuvastatin (CRESTOR) 40 MG tablet Take 1 tablet (40 mg total) by mouth daily. (Patient taking  differently: Take 40 mg by mouth at bedtime. )  . valACYclovir (VALTREX) 1000 MG tablet Take 1,000 mg by mouth 2 (two) times daily as needed (for fever blisters.).      Allergies:   Patient has no known allergies.   Social History   Socioeconomic History  . Marital status: Married    Spouse name: Not on file  . Number of children: 2  . Years of education: Not on file  . Highest education level: Not on file  Occupational History  . Not on file  Tobacco Use  . Smoking status: Former Smoker    Packs/day: 1.00    Years: 16.00    Pack years: 16.00    Types: Cigarettes    Quit date: 09/26/1972    Years since quitting: 47.5  . Smokeless tobacco: Never Used  Vaping Use  . Vaping Use: Never used  Substance and Sexual Activity  . Alcohol use: Yes    Alcohol/week: 2.0 standard drinks    Types: 2 Glasses of wine per week    Comment:  2-3 Drinks of Scotch per Day  . Drug use: No  . Sexual activity: Not on file  Other Topics Concern  . Not on file  Social History Narrative  . Not on file   Social Determinants of Health   Financial Resource Strain:   . Difficulty of Paying Living Expenses:   Food Insecurity:   . Worried About Charity fundraiser in the Last Year:   . Arboriculturist in the Last Year:   Transportation Needs:   . Film/video editor (Medical):   [name] Lack of Transportation (Non-Medical):   Physical Activity:   . Days of Exercise per Week:   . Minutes of Exercise per Session:   Stress:   . Feeling of Stress :   Social Connections:   . Frequency of Communication with Friends and Family:   . Frequency of Social Gatherings with Friends and Family:   . Attends Religious Services:   . Active Member of Clubs or Organizations:   . Attends Archivist Meetings:   [name] Marital Status:      Family History: The patient's family history includes Cancer in his brother and sister; Cancer (age of onset: 87) in his mother; Deep vein thrombosis in his mother;  Diabetes in his father and mother; Heart attack in his father and mother; Heart disease in his mother; Hyperlipidemia in his mother; Hypertension in his mother; Other in his mother and sister; Stroke in his mother; Varicose Veins in his mother. There is no history of Colon cancer or Esophageal cancer. ROS:   Please see the history of present illness.    All 14 point review of systems negative except as described per history of present illness  EKGs/Labs/Other Studies Reviewed:      Recent Labs: No results found for requested labs within last 8760 hours.  Recent Lipid Panel    Component Value Date/Time   CHOL 159 11/21/2018 0836   TRIG 82 11/21/2018 0836   HDL 59 11/21/2018 0836   CHOLHDL 2.7 11/21/2018 0836   LDLCALC 84 11/21/2018 0836    Physical Exam:    VS:  BP 112/70 (BP Location: Left Arm, Patient Position: Sitting, Cuff Size: Normal)   Pulse (!) 57   Ht 5' 8.5" (1.74 m)   Wt 189 lb (85.7 kg)   SpO2 95%   BMI 28.32 kg/m     Wt Readings from Last 3 Encounters:  04/02/20 189 lb (85.7 kg)  03/31/20 187 lb (84.8 kg)  10/16/19 190 lb (86.2 kg)     GEN:  Well nourished, well developed in no acute distress HEENT: Normal NECK: No JVD; No carotid bruits LYMPHATICS: No lymphadenopathy CARDIAC: RRR, no murmurs, no rubs, no gallops RESPIRATORY:  Clear to auscultation without rales, wheezing or rhonchi  ABDOMEN: Soft, non-tender, non-distended MUSCULOSKELETAL:  No edema; No deformity  SKIN: Warm and dry LOWER EXTREMITIES: 1+ letter well in both sides left side appears to more swelling no tenderness no redness. NEUROLOGIC:  Alert and oriented x 3 PSYCHIATRIC:  Normal affect   ASSESSMENT:    1. Bilateral carotid artery stenosis   2. Dyspnea on exertion   3. Essential hypertension   4. Dyslipidemia    PLAN:    In order of problems listed above:  1. Dyspnea on exertion with some swelling of lower extremities obviously very concerning.  I will check Chem-7 as well as  proBNP.  D-dimer will be done as well if D-dimer is negative we will drop the issue of potentially having DVT which is rather unlikely in my opinion if D-dimer is positive then ultrasounds venous of lower extremity need to be done.  Also after I get results of his blood test I will decide about augmenting his diuresis.  A part of evaluation will be echocardiogram to assess left ventricle ejection fraction.  Luckily his weights remain stable. 2. Bilateral carotic arterial stenosis this will be assessed later. 3. Essential hypertension seems to be controlled today.  112/70 we will continue present management. 4. Dyslipidemia: I did review his K PN I have results from 2020 which show LDL of 84 HDL 59, also review care everywhere I see cholesterol done few months ago for primary care physician showing LDL 75 and HDL of 49.  He is on WelChol as well as Crestor maximum dose will continue for now.   Medication Adjustments/Labs and Tests Ordered: Current medicines are reviewed at length with the patient today.  Concerns regarding medicines are outlined above.  No orders of the defined types were placed in this encounter.  Medication changes: No orders of the defined types were placed in this encounter.   Signed, Park Liter, MD, [facility] 04/02/2020 9:56 AM    [place]

## 2020-04-03 LAB — BASIC METABOLIC PANEL
BUN/Creatinine Ratio: 16 (ref 10–24)
BUN: 15 mg/dL (ref 8–27)
CO2: 26 mmol/L (ref 20–29)
Calcium: 9.2 mg/dL (ref 8.6–10.2)
Chloride: 102 mmol/L (ref 96–106)
Creatinine, Ser: 0.93 mg/dL (ref 0.76–1.27)
GFR calc Af Amer: 89 mL/min/{1.73_m2} (ref >59)
GFR calc non Af Amer: 77 mL/min/{1.73_m2} (ref >59)
Glucose: 114 mg/dL — ABNORMAL HIGH (ref 65–99)
Potassium: 4 mmol/L (ref 3.5–5.2)
Sodium: 141 mmol/L (ref 134–144)

## 2020-04-03 LAB — D-DIMER, QUANTITATIVE: D-DIMER: 1.03 mg/L FEU — ABNORMAL HIGH (ref 0.00–0.49)

## 2020-04-03 LAB — PRO B NATRIURETIC PEPTIDE: NT-Pro BNP: 327 pg/mL (ref 0–486)

## 2020-04-06 ENCOUNTER — Telehealth: Payer: Self-pay

## 2020-04-06 DIAGNOSIS — R7989 Other specified abnormal findings of blood chemistry: Secondary | ICD-10-CM

## 2020-04-06 NOTE — Telephone Encounter (Signed)
Called patient, he reports he has made some changes in his diet the past 3 days and his swelling has gone down for this he doesn't wish to add a diuretic as [name] and him discussed at his previous appointment.   He is also asking about his lab results from his last visit. I let him know that [name] has not viewed them yet but I would let him know the patient was requesting the results.

## 2020-04-06 NOTE — Telephone Encounter (Signed)
Patient wanted to speak with [name] assistant to ask about the possibility of changing his diuretic because he has had some swelling in his feet.  Patient wants to know if [name] would possibly be changing it.

## 2020-04-08 NOTE — Telephone Encounter (Signed)
I did review his blood work.  Looks good.  Does not show evidence of CHF.  Concern is that his D-dimer is abnormal.  As explained to him during the visit positive D-dimer does not indicate much it for signs to look at ultrasounds of his legs make sure he does have no DVT.  Select schedule him for DVT study.

## 2020-04-08 NOTE — Telephone Encounter (Signed)
Called patient informed him of results. Will have a scheduler get his appt for the ultrasound pt aware someone will call him. No further questions.

## 2020-04-13 ENCOUNTER — Other Ambulatory Visit: Payer: Self-pay

## 2020-04-13 ENCOUNTER — Ambulatory Visit (HOSPITAL_BASED_OUTPATIENT_CLINIC_OR_DEPARTMENT_OTHER)
Admission: RE | Admit: 2020-04-13 | Discharge: 2020-04-13 | Disposition: A | Discharge status: Home / Self Care | Payer: Medicare Other | Source: Ambulatory Visit | Attending: Cardiology | Admitting: Cardiology

## 2020-04-13 ENCOUNTER — Ambulatory Visit (HOSPITAL_COMMUNITY)
Admission: RE | Admit: 2020-04-13 | Discharge: 2020-04-13 | Disposition: A | Discharge status: Home / Self Care | Payer: Medicare Other | Source: Ambulatory Visit | Attending: Cardiology | Admitting: Cardiology

## 2020-04-13 DIAGNOSIS — I10 Essential (primary) hypertension: Secondary | ICD-10-CM | POA: Diagnosis present

## 2020-04-13 DIAGNOSIS — R7989 Other specified abnormal findings of blood chemistry: Secondary | ICD-10-CM | POA: Diagnosis present

## 2020-04-13 DIAGNOSIS — R0600 Dyspnea, unspecified: Secondary | ICD-10-CM | POA: Insufficient documentation

## 2020-04-13 DIAGNOSIS — R0609 Other forms of dyspnea: Secondary | ICD-10-CM

## 2020-04-13 DIAGNOSIS — I6523 Occlusion and stenosis of bilateral carotid arteries: Secondary | ICD-10-CM | POA: Insufficient documentation

## 2020-04-13 LAB — ECHOCARDIOGRAM COMPLETE
AR max vel: 2.53 cm2
AV Area VTI: 2.76 cm2
AV Area mean vel: 2.43 cm2
AV Mean grad: 2 mmHg
AV Peak grad: 4.3 mmHg
Ao pk vel: 1.04 m/s
Area-P 1/2: 3.53 cm2
Calc EF: 66 %
S' Lateral: 3 cm
Single Plane A2C EF: 73.5 %
Single Plane A4C EF: 60.6 %

## 2020-04-13 NOTE — Progress Notes (Signed)
VASCULAR LAB PRELIMINARY  PRELIMINARY  PRELIMINARY  PRELIMINARY  Bilateral lower extremity venous duplex completed.    Preliminary report:  See CV proc for preliminary results.  Kamalei Roeder, RVT 04/13/2020, 1:09 PM

## 2020-04-13 NOTE — Progress Notes (Signed)
  Echocardiogram 2D Echocardiogram has been performed.  Louis Barton 04/13/2020, 2:14 PM

## 2020-04-14 DIAGNOSIS — Z8673 Personal history of transient ischemic attack (TIA), and cerebral infarction without residual deficits: Secondary | ICD-10-CM | POA: Insufficient documentation

## 2020-04-14 DIAGNOSIS — Z862 Personal history of diseases of the blood and blood-forming organs and certain disorders involving the immune mechanism: Secondary | ICD-10-CM | POA: Insufficient documentation

## 2020-04-14 DIAGNOSIS — Z8719 Personal history of other diseases of the digestive system: Secondary | ICD-10-CM | POA: Insufficient documentation

## 2020-04-14 DIAGNOSIS — B029 Zoster without complications: Secondary | ICD-10-CM | POA: Insufficient documentation

## 2020-04-14 DIAGNOSIS — M1990 Unspecified osteoarthritis, unspecified site: Secondary | ICD-10-CM | POA: Insufficient documentation

## 2020-04-15 ENCOUNTER — Ambulatory Visit (INDEPENDENT_AMBULATORY_CARE_PROVIDER_SITE_OTHER): Payer: Medicare Other | Admitting: Gastroenterology

## 2020-04-15 ENCOUNTER — Encounter: Payer: Self-pay | Admitting: Gastroenterology

## 2020-04-15 VITALS — BP 106/64 | HR 64 | Ht 68.5 in | Wt 188.4 lb

## 2020-04-15 DIAGNOSIS — R970 Elevated carcinoembryonic antigen [CEA]: Secondary | ICD-10-CM | POA: Diagnosis not present

## 2020-04-15 NOTE — Progress Notes (Signed)
Chief Complaint: Elevated CEA  Referring Provider:  Raina Mina., MD, Dr Hinton Rao      ASSESSMENT AND PLAN;   #1. Elevated CEA (9-9.4). H/O smoking in the past. H/O polyps 01/2017, 2019 #2. 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 Being followed by CTS. #3. Borderline bradycardia- undergoing cardiac eval by Dr Raliegh Ip (had recent 48hr cardiac monitor,), s/p stress test 3 yrs ago. Neg 2DE 03/2020. Has appt with Dr Raliegh Ip tomorrow #4. Asymptomatic HH #5. 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  I do agree. - Cardiac clearence prior - Hold plavix 5 days before. Can continue aspirin throughout. -Trend CEA level (could be elevated due to non-GI reasons). He has appt with Dr Hinton Rao.  HPI:    Louis Barton is a 79 y.o. male   For elevated CEA.  Has increased from 9 to 10.4  No GI symptoms  Last colonoscopy October 2019 was negative except for small tubular adenoma.  He had good preparation.   Seen by Dr. Hinton Rao and advised to repeat colonoscopy at an earlier interval.  Despite of his advanced age, he is doing very well medically.  Has appointment with Dr. Raliegh Ip tomorrow    Past GI procedures: -Colonoscopy 06/2018: small polyp s/p polypectomy- 02/23/2017: (PCF)2 tubular adenomas, moderate sigmoid diverticulosis. 01/2014: Tubular adenomas. 09/2008: Colon polyps. Past Medical History:  Diagnosis Date  . Aftercare following surgery of the circulatory system, [place] 12/19/2013  . Arthritis    DDD- aging   . Atherosclerosis 11/16/2015  . Atypical chest pain 06/11/2015  . Benign paroxysmal positional vertigo 08/08/2016  . Bradycardia 03/18/2015  . CAD (coronary artery disease)   . 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 Carotid stenosis   . Chronic kidney disease    cysts on both kidneys, seeing [name] in [place], [facility] [place]   . Cramps of left  lower extremity-Calf  > right calf 01/01/2015  . 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 signficant DDD present with vacuum effect L4-5 and L5-S1 flat discs and anterior spurring noted  . Dyslipidemia 03/18/2015  . Ear itch 05/14/2018  . Edema 11/16/2015  . Edema of both lower extremities due to peripheral venous insufficiency 02/03/2020  . History of hiatal hernia    seen on last scan- as slight   . History of thrombocytopenia   . History of TIAs   . Hyperlipidemia   . Hypertension   . Irritable bowel syndrome 11/17/2016   rec trial of citrucel/ diet 11/16/2016 >>>     . Local edema 02/07/2018  . Malignant neoplasm of left lung ([place]) 12/08/2017   Stage 1A. [place]. And Dr. [name]  . 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 Occlusion and stenosis of carotid artery without mention of cerebral infarction 12/01/2011  . PAD (peripheral artery disease) ([place])   . Prediabetes 12/25/2018  . Preop cardiovascular exam 04/14/2011  . Renal cysts, acquired, bilateral 10/24/2016   Formatting of this note might be different from the original. Renal parapelvic cysts 10/06/16 on CT , right kidney midpole 1.2 cm, right kidney anterior pole 1.1 cm, and to the left kidney lower pole anterior position 3.5cm all felt to be benign  . Right lower lobe pulmonary nodule 11/09/2017  . Shingles  internal- obstruction of bowels & gallbladder  . Solitary pulmonary nodule 11/16/2016   CT ABd 03/24/09 linerar densities in RLL and RML c/w scarring o/w clear CT Abd 10/20/16 c/w 7 mm nodule  - CT chest 02/13/2017 :  slt enlarged to 9 mm   - PET 04/04/17 :1. These slowly enlarging 9 mm right lower lobe pulmonary nodule along the right hemidiaphragm does not appear hypermetabolic today. Location adjacent to the hemidiaphragm can cause false negatives due to motion artifact related to the  . Stroke ([place])   . Swelling of limb-Left foot 01/01/2015  . Vitamin B12 deficiency 06/21/2017     Past Surgical History:  Procedure Laterality Date  . adenocarcinoma  2019   Removed  . CAROTID ENDARTERECTOMY  2005   Left carotid by Dr. Oneida Alar  . CAROTID ENDARTERECTOMY  04/25/11   Right Carotid by Dr. Oneida Alar  . COLONOSCOPY  02/23/2017   Colonic polyp status post polypectomy. Pancolonic diverticulosis predominatly in the sigmoid colon. Tubular adenoma  . Septoplasty, nasal w/ submucosal resection  1960  . SQUAMOUS CELL CARCINOMA EXCISION     pt said numerous times  . TONSILLECTOMY    . VIDEO ASSISTED THORACOSCOPY (VATS)/WEDGE RESECTION Right 11/09/2017   Procedure: RIGHT VIDEO ASSISTED THORACOSCOPY WITH Chales Salmon RESECTION;  Surgeon: [name], MD;  Location: [facility] OR;  Service: Thoracic;  Laterality: Right;    Family History  Problem Relation Age of Onset  . Cancer Mother 69       Breast  . Stroke Mother   . Diabetes Mother   . Hyperlipidemia Mother   . Other Mother        varicose veins  . Heart attack Mother   . Heart disease Mother        Left ankle swelling  . Hypertension Mother   . Deep vein thrombosis Mother   . Varicose Veins Mother   . Diabetes Father   . Heart attack Father   . Other Sister        Tumor in [place]  . Cancer Sister        Tumor   Lung  and  Brain  . Cancer Brother        BCC-SCC-Merkle Cell  . Colon cancer Neg Hx   . Esophageal cancer Neg Hx     Social History   Tobacco Use  . Smoking status: Former Smoker    Packs/day: 1.00    Years: 16.00    Pack years: 16.00    Types: Cigarettes    Quit date: 09/26/1972    Years since quitting: 47.5  . Smokeless tobacco: Never Used  Vaping Use  . Vaping Use: Never used  Substance Use Topics  . Alcohol use: Yes    Alcohol/week: 2.0 standard drinks    Types: 2 Glasses of wine per week    Comment:  2-3 Drinks of Scotch per Day  . Drug use: No    Current Outpatient Medications  Medication Sig Dispense Refill  . acetaminophen (TYLENOL) 500 MG tablet Take 1,000 mg by mouth  every 6 (six) hours as needed (for pain.).     [name] aspirin EC 81 MG tablet Take 81 mg by mouth daily.    . clopidogrel (PLAVIX) 75 MG tablet Take 75 mg by mouth at bedtime.     . colesevelam (WELCHOL) 625 MG tablet Take 1,250 mg by mouth 2 (two) times daily with a meal.     . hydrochlorothiazide (HYDRODIURIL) 25 MG tablet Take  25 mg by mouth daily.     [name] losartan (COZAAR) 100 MG tablet Take 100 mg by mouth daily.     . rosuvastatin (CRESTOR) 40 MG tablet Take 1 tablet (40 mg total) by mouth daily. (Patient taking differently: Take 40 mg by mouth at bedtime. ) 30 tablet 6  . valACYclovir (VALTREX) 1000 MG tablet Take 1,000 mg by mouth 2 (two) times daily as needed (for fever blisters.).      No current facility-administered medications for this visit.    No Known Allergies  Review of Systems:  neg     Physical Exam:    BP 106/64   Pulse 64   Ht 5' 8.5" (1.74 m)   Wt 188 lb 6 oz (85.4 kg)   BMI 28.23 kg/m  Filed Weights   04/15/20 1130  Weight: 188 lb 6 oz (85.4 kg)   Constitutional:  Well-developed, in no acute distress. Psychiatric: Normal mood and affect. Behavior is normal. HEENT: Pupils normal.  Conjunctivae are normal. No scleral icterus. Neck supple.  Cardiovascular: Normal rate, regular rhythm. No edema Pulmonary/chest: Effort normal and breath sounds normal. No wheezing, rales or rhonchi. Abdominal: Soft, nondistended. Nontender. Bowel sounds active throughout. There are no masses palpable. No hepatomegaly. Rectal:  defered Neurological: Alert and oriented to person place and time. Skin: Skin is warm and dry. No rashes noted.  Data Reviewed: I have personally reviewed following labs and imaging studies  CBC: CBC Latest Ref Rng & Units 11/11/2017 11/10/2017 11/07/2017  WBC 4.0 - 10.5 K/uL 12.2(H) 14.1(H) 7.5  Hemoglobin 13.0 - 17.0 g/dL 14.0 13.6 15.8  Hematocrit 39 - 52 % 41.6 39.6 46.1  Platelets 150 - 400 K/uL 123(L) 140(L) 149(L)    CMP: CMP Latest Ref Rng &  Units 04/02/2020 11/21/2018 11/11/2017  Glucose 65 - 99 mg/dL 114(H) - 110(H)  BUN 8 - 27 mg/dL 15 - 11  Creatinine 0.76 - 1.27 mg/dL 0.93 - 0.87  Sodium 134 - 144 mmol/L 141 - 139  Potassium 3.5 - 5.2 mmol/L 4.0 - 3.6  Chloride 96 - 106 mmol/L 102 - 105  CO2 20 - 29 mmol/L 26 - 22  Calcium 8.6 - 10.2 mg/dL 9.2 - 8.3(L)  Total Protein 6.0 - 8.5 g/dL - 6.7 5.9(L)  Total Bilirubin 0.0 - 1.2 mg/dL - 0.5 1.3(H)  Alkaline Phos 39 - 117 IU/L - 60 39  AST 0 - 40 IU/L - 22 28  ALT 0 - 44 IU/L - 15 16(L)   CT 04/30/2018 chest abdomen and pelvis -Surgical changes right lower lobe with moderate sized hydropneumothorax suspicious for small bronchopleural fistula -Moderate hiatal hernia    Carmell Austria, MD 04/15/2020, 11:45 AM  Cc: Raina Mina., MD  Dr. Hinton Rao

## 2020-04-15 NOTE — Patient Instructions (Addendum)
If you are age 81 or older, your body mass index should be between 23-30. Your Body mass index is 28.23 kg/m. If this is out of the aforementioned range listed, please consider follow up with your Primary Care Provider.  If you are age 76 or younger, your body mass index should be between 19-25. Your Body mass index is 28.23 kg/m. If this is out of the aformentioned range listed, please consider follow up with your Primary Care Provider.   Colonoscopy to be scheduled January 2022.  Reminder placed in patients chart.  Will need cardiac clearance for Plavix.  Thank you,  Dr. Jackquline Denmark

## 2020-04-16 ENCOUNTER — Ambulatory Visit (INDEPENDENT_AMBULATORY_CARE_PROVIDER_SITE_OTHER): Payer: Medicare Other | Admitting: Cardiology

## 2020-04-16 ENCOUNTER — Encounter: Payer: Self-pay | Admitting: Cardiology

## 2020-04-16 ENCOUNTER — Other Ambulatory Visit: Payer: Self-pay

## 2020-04-16 VITALS — BP 122/60 | HR 66 | Ht 68.5 in | Wt 188.6 lb

## 2020-04-16 DIAGNOSIS — E782 Mixed hyperlipidemia: Secondary | ICD-10-CM

## 2020-04-16 DIAGNOSIS — I6523 Occlusion and stenosis of bilateral carotid arteries: Secondary | ICD-10-CM

## 2020-04-16 DIAGNOSIS — I2510 Atherosclerotic heart disease of native coronary artery without angina pectoris: Secondary | ICD-10-CM

## 2020-04-16 DIAGNOSIS — I739 Peripheral vascular disease, unspecified: Secondary | ICD-10-CM | POA: Diagnosis not present

## 2020-04-16 NOTE — Patient Instructions (Signed)

## 2020-04-16 NOTE — Progress Notes (Signed)
Cardiology Office Note:    Date:  04/16/2020   ID:  Louis Barton, DOB 1941/02/17, MRN [id number]  PCP:  Raina Mina., MD  Cardiologist:  Jenne Campus, MD    Referring MD: Raina Mina., MD   No chief complaint on file. Doing better but still have some swelling of lower extremities.  History of Present Illness:    Louis Barton is a 79 y.o. male with history of coronary artery disease, peripheral vascular disease, bradycardia, essential hypertension, dyslipidemia.  Comes today to my office discuss results of his test.  Last time I have seen him concern was fatigue tiredness shortness of breath as well as some swelling of lower extremities.  All problem started after he got quite rough course with COVID-19 infection.  We did cardiac work-up which showed preserved left ventricle ejection fraction, normal diastolic function, mild left ventricle hypertrophy.  We did blood test for congestive heart failure which was negative, however, his D-dimer was positive.  DVT study has been done after that which showed no evidence of DVT.  Overall we spent a long time today to talk about the reasons for his problems.  He does have elastic stocking that he typically used in the wintertime but now because of hot weather he is not doing it.  He is planning to go back to gym and I encouraged him to do that.  I asked him to keep his leg elevated.  I did talk to him about potentially taking were more diuretics, however he is not want to do that.  Past Medical History:  Diagnosis Date  . Aftercare following surgery of the circulatory system, [place] 12/19/2013  . Arthritis    DDD- aging   . Atherosclerosis 11/16/2015  . Atypical chest pain 06/11/2015  . Benign paroxysmal positional vertigo 08/08/2016  . Bradycardia 03/18/2015  . CAD (coronary artery disease)   . 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 Carotid stenosis   . Chronic kidney disease    cysts on both  kidneys, seeing [name] in [place], [facility]   . Cramps of left lower extremity-Calf  > right calf 01/01/2015  . 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 signficant DDD present with vacuum effect L4-5 and L5-S1 flat discs and anterior spurring noted  . Dyslipidemia 03/18/2015  . Ear itch 05/14/2018  . Edema 11/16/2015  . Edema of both lower extremities due to peripheral venous insufficiency 02/03/2020  . History of hiatal hernia    seen on last scan- as slight   . History of thrombocytopenia   . History of TIAs   . Hyperlipidemia   . Hypertension   . Irritable bowel syndrome 11/17/2016   rec trial of citrucel/ diet 11/16/2016 >>>     . Local edema 02/07/2018  . Malignant neoplasm of left lung ([place]) 12/08/2017   Stage 1A. [place]. And Dr. [name]  . 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 Occlusion and stenosis of carotid artery without mention of cerebral infarction 12/01/2011  . PAD (peripheral artery disease) ([place])   . Prediabetes 12/25/2018  . Preop cardiovascular exam 04/14/2011  . Renal cysts, acquired, bilateral 10/24/2016   Formatting of this note might be different from the original. Renal parapelvic cysts 10/06/16 on CT , right kidney midpole 1.2 cm, right kidney anterior pole 1.1 cm, and to  the left kidney lower pole anterior position 3.5cm all felt to be benign  . Right lower lobe pulmonary nodule 11/09/2017  . Shingles    internal- obstruction of bowels & gallbladder  . Solitary pulmonary nodule 11/16/2016   CT ABd 03/24/09 linerar densities in RLL and RML c/w scarring o/w clear CT Abd 10/20/16 c/w 7 mm nodule  - CT chest 02/13/2017 :  slt enlarged to 9 mm   - PET 04/04/17 :1. These slowly enlarging 9 mm right lower lobe pulmonary nodule along the right hemidiaphragm does not appear hypermetabolic today. Location adjacent to the hemidiaphragm can cause false negatives due to motion artifact related to the  . Stroke ([place])    . Swelling of limb-Left foot 01/01/2015  . Vitamin B12 deficiency 06/21/2017    Past Surgical History:  Procedure Laterality Date  . adenocarcinoma  2019   Removed  . CAROTID ENDARTERECTOMY  2005   Left carotid by Dr. Oneida Alar  . CAROTID ENDARTERECTOMY  04/25/11   Right Carotid by Dr. Oneida Alar  . COLONOSCOPY  02/23/2017   Colonic polyp status post polypectomy. Pancolonic diverticulosis predominatly in the sigmoid colon. Tubular adenoma  . Septoplasty, nasal w/ submucosal resection  1960  . SQUAMOUS CELL CARCINOMA EXCISION     pt said numerous times  . TONSILLECTOMY    . VIDEO ASSISTED THORACOSCOPY (VATS)/WEDGE RESECTION Right 11/09/2017   Procedure: RIGHT VIDEO ASSISTED THORACOSCOPY WITH Chales Salmon RESECTION;  Surgeon: [name], MD;  Location: [facility];  Service: Thoracic;  Laterality: Right;    Current Medications: Current Meds  Medication Sig  . acetaminophen (TYLENOL) 500 MG tablet Take 1,000 mg by mouth every 6 (six) hours as needed (for pain.).   [name] aspirin EC 81 MG tablet Take 81 mg by mouth daily.  . clopidogrel (PLAVIX) 75 MG tablet Take 75 mg by mouth at bedtime.   . colesevelam (WELCHOL) 625 MG tablet Take 1,250 mg by mouth 2 (two) times daily with a meal.   . hydrochlorothiazide (HYDRODIURIL) 25 MG tablet Take 25 mg by mouth daily.   [name] losartan (COZAAR) 100 MG tablet Take 100 mg by mouth daily.   . rosuvastatin (CRESTOR) 40 MG tablet Take 1 tablet (40 mg total) by mouth daily. (Patient taking differently: Take 40 mg by mouth at bedtime. )  . valACYclovir (VALTREX) 1000 MG tablet Take 1,000 mg by mouth 2 (two) times daily as needed (for fever blisters.).      Allergies:   Patient has no known allergies.   Social History   Socioeconomic History  . Marital status: Married    Spouse name: Not on file  . Number of children: 2  . Years of education: Not on file  . Highest education level: Not on file  Occupational History  . Not on file  Tobacco Use  . Smoking  status: Former Smoker    Packs/day: 1.00    Years: 16.00    Pack years: 16.00    Types: Cigarettes    Quit date: 09/26/1972    Years since quitting: 47.5  . Smokeless tobacco: Never Used  Vaping Use  . Vaping Use: Never used  Substance and Sexual Activity  . Alcohol use: Yes    Alcohol/week: 2.0 standard drinks    Types: 2 Glasses of wine per week    Comment:  2-3 Drinks of Scotch per Day  . Drug use: No  . Sexual activity: Not on file  Other Topics Concern  . Not on file  Social  History Narrative  . Not on file   Social Determinants of Health   Financial Resource Strain:   . Difficulty of Paying Living Expenses:   Food Insecurity:   . Worried About Charity fundraiser in the Last Year:   . Arboriculturist in the Last Year:   Transportation Needs:   . Film/video editor (Medical):   [name] Lack of Transportation (Non-Medical):   Physical Activity:   . Days of Exercise per Week:   . Minutes of Exercise per Session:   Stress:   . Feeling of Stress :   Social Connections:   . Frequency of Communication with Friends and Family:   . Frequency of Social Gatherings with Friends and Family:   . Attends Religious Services:   . Active Member of Clubs or Organizations:   . Attends Archivist Meetings:   [name] Marital Status:      Family History: The patient's family history includes Cancer in his brother and sister; Cancer (age of onset: 59) in his mother; Deep vein thrombosis in his mother; Diabetes in his father and mother; Heart attack in his father and mother; Heart disease in his mother; Hyperlipidemia in his mother; Hypertension in his mother; Other in his mother and sister; Stroke in his mother; Varicose Veins in his mother. There is no history of Colon cancer or Esophageal cancer. ROS:   Please see the history of present illness.    All 14 point review of systems negative except as described per history of present illness  EKGs/Labs/Other Studies Reviewed:       Recent Labs: 04/02/2020: BUN 15; Creatinine, Ser 0.93; NT-Pro BNP 327; Potassium 4.0; Sodium 141  Recent Lipid Panel    Component Value Date/Time   CHOL 159 11/21/2018 0836   TRIG 82 11/21/2018 0836   HDL 59 11/21/2018 0836   CHOLHDL 2.7 11/21/2018 0836   LDLCALC 84 11/21/2018 0836    Physical Exam:    VS:  BP 122/60 (BP Location: Right Arm, Patient Position: Sitting, Cuff Size: Normal)   Pulse 66   Ht 5' 8.5" (1.74 m)   Wt 188 lb 9.6 oz (85.5 kg)   SpO2 97%   BMI 28.26 kg/m     Wt Readings from Last 3 Encounters:  04/16/20 188 lb 9.6 oz (85.5 kg)  04/15/20 188 lb 6 oz (85.4 kg)  04/02/20 189 lb (85.7 kg)     GEN:  Well nourished, well developed in no acute distress HEENT: Normal NECK: No JVD; No carotid bruits LYMPHATICS: No lymphadenopathy CARDIAC: RRR, no murmurs, no rubs, no gallops RESPIRATORY:  Clear to auscultation without rales, wheezing or rhonchi  ABDOMEN: Soft, non-tender, non-distended MUSCULOSKELETAL:  No edema; No deformity  SKIN: Warm and dry LOWER EXTREMITIES: Minimal swelling. NEUROLOGIC:  Alert and oriented x 3 PSYCHIATRIC:  Normal affect   ASSESSMENT:    1. Coronary artery disease involving native coronary artery of native heart without angina pectoris   2. Mixed hyperlipidemia   3. Bilateral carotid artery stenosis   4. PAD (peripheral artery disease) (HCC)    PLAN:    In order of problems listed above:  1. Coronary disease stable denies having issue. 2. Mixed dyslipidemia, he is on a WelChol as well as Crestor which I will continue.  I did review his K PN I see his LDL of 84 HDL 59 this is from 2020 we will make arrangements for another test. 3. Bilateral carotic arterial disease.  He will be  scheduled to have carotic ultrasound. 4. Swelling of lower extremities.  Probably multifactorial, likely proBNP normal, ejection fraction normal, diastolic function normal.  He is planning to go back to gym that should help with the swelling as  well he will keep his legs elevated.  He does have elastic stockings that he will try to use anytime he can.   Medication Adjustments/Labs and Tests Ordered: Current medicines are reviewed at length with the patient today.  Concerns regarding medicines are outlined above.  No orders of the defined types were placed in this encounter.  Medication changes: No orders of the defined types were placed in this encounter.   Signed, Park Liter, MD, [facility] 04/16/2020 10:09 AM    [place]

## 2020-04-21 ENCOUNTER — Other Ambulatory Visit: Payer: Medicare Other

## 2020-05-06 DIAGNOSIS — R42 Dizziness and giddiness: Secondary | ICD-10-CM | POA: Insufficient documentation

## 2020-05-06 HISTORY — DX: Dizziness and giddiness: R42

## 2020-05-15 ENCOUNTER — Encounter: Payer: Self-pay | Admitting: Cardiology

## 2020-05-15 ENCOUNTER — Other Ambulatory Visit: Payer: Self-pay

## 2020-05-15 ENCOUNTER — Ambulatory Visit (INDEPENDENT_AMBULATORY_CARE_PROVIDER_SITE_OTHER): Payer: Medicare Other | Admitting: Cardiology

## 2020-05-15 VITALS — BP 114/62 | HR 60 | Ht 63.0 in | Wt 185.4 lb

## 2020-05-15 DIAGNOSIS — I712 Thoracic aortic aneurysm, without rupture: Secondary | ICD-10-CM

## 2020-05-15 DIAGNOSIS — E782 Mixed hyperlipidemia: Secondary | ICD-10-CM | POA: Diagnosis not present

## 2020-05-15 DIAGNOSIS — I10 Essential (primary) hypertension: Secondary | ICD-10-CM | POA: Diagnosis not present

## 2020-05-15 DIAGNOSIS — I2510 Atherosclerotic heart disease of native coronary artery without angina pectoris: Secondary | ICD-10-CM | POA: Diagnosis not present

## 2020-05-15 DIAGNOSIS — I7121 Aneurysm of the ascending aorta, without rupture: Secondary | ICD-10-CM

## 2020-05-15 DIAGNOSIS — I739 Peripheral vascular disease, unspecified: Secondary | ICD-10-CM | POA: Diagnosis not present

## 2020-05-15 HISTORY — DX: Aneurysm of the ascending aorta, without rupture: I71.21

## 2020-05-15 HISTORY — DX: Thoracic aortic aneurysm, without rupture: I71.2

## 2020-05-15 NOTE — Progress Notes (Signed)
Cardiology Office Note:    Date:  05/15/2020   ID:  Louis Barton, DOB 1940/12/27, MRN [id number]  PCP:  Raina Mina., MD  Cardiologist:  Jenne Campus, MD    Referring MD: Raina Mina., MD   No chief complaint on file. Doing better  History of Present Illness:    Louis Barton is a 79 y.o. male with past medical history significant for remote coronary artery disease, peripheral vascular disease in form of bilateral carotic arterial disease status post carotic endarterectomy, essential hypertension, dyslipidemia, history of lung CA.  Comes today to my for follow-up.  Recently we concentrated our investigation of swollen legs.  proBNP was normal, echocardiogram was done which showed preserved ejection fraction, no cardiac explanation for his swelling.  He did have DVT study done on the leg which showed no DVT.  Overall he is doing better and he said his legs are not bothering him much.  I encouraged him to go back to gym and start exercising on the regular basis which should improve the situation.  He did have few additional complaints.  And concerns, vertigo is 1 but it clearly looks like a vertigo positional, on top of that he was worried he may have metastasis to the brain because of history of lung CA, MRI reviewed with the patient showing no evidence of metastasis disease to his brain.  Looks good.  He is fairly pleased with the results.  Also his primary care physician schedule him to have a chest CT to look at the entire aorta to rule out any aneurysm that could be in therapy on the ascending aorta.  I think is a good idea.  He also brought an issue of his venous study of lower extremities there is some problem with the return of the blood on the right side above inguinal ligament.  He said years ago he had a cyst that was drained I suspect this is a scar tissue over there.  I offered him CT of his pelvis however he declined.  Past Medical History:  Diagnosis Date  . Aftercare  following surgery of the circulatory system, [place] 12/19/2013  . Arthritis    DDD- aging   . Atherosclerosis 11/16/2015  . Atypical chest pain 06/11/2015  . Benign paroxysmal positional vertigo 08/08/2016  . Bradycardia 03/18/2015  . CAD (coronary artery disease)   . 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 Carotid stenosis   . Chronic kidney disease    cysts on both kidneys, seeing [name] in [place], [facility]   . Cramps of left lower extremity-Calf  > right calf 01/01/2015  . 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 signficant DDD present with vacuum effect L4-5 and L5-S1 flat discs and anterior spurring noted  . Dyslipidemia 03/18/2015  . Ear itch 05/14/2018  . Edema 11/16/2015  . Edema of both lower extremities due to peripheral venous insufficiency 02/03/2020  . History of hiatal hernia    seen on last scan- as slight   . History of thrombocytopenia   . History of TIAs   . Hyperlipidemia   . Hypertension   . Irritable bowel syndrome 11/17/2016   rec trial of citrucel/ diet 11/16/2016 >>>     . Local edema 02/07/2018  . 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 [place]. And Dr. [name]  . 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 Occlusion and stenosis of carotid artery without mention of cerebral infarction 12/01/2011  . PAD (peripheral artery disease) ([place])   . Prediabetes 12/25/2018  . Preop cardiovascular exam 04/14/2011  . Renal cysts, acquired, bilateral 10/24/2016   Formatting of this note might be different from the original. Renal parapelvic cysts 10/06/16 on CT , right kidney midpole 1.2 cm, right kidney anterior pole 1.1 cm, and to the left kidney lower pole anterior position 3.5cm all felt to be benign  . Right lower lobe pulmonary nodule 11/09/2017  . Shingles    internal- obstruction of bowels & gallbladder  . Solitary pulmonary nodule  11/16/2016   CT ABd 03/24/09 linerar densities in RLL and RML c/w scarring o/w clear CT Abd 10/20/16 c/w 7 mm nodule  - CT chest 02/13/2017 :  slt enlarged to 9 mm   - PET 04/04/17 :1. These slowly enlarging 9 mm right lower lobe pulmonary nodule along the right hemidiaphragm does not appear hypermetabolic today. Location adjacent to the hemidiaphragm can cause false negatives due to motion artifact related to the  . Stroke ([place])   . Swelling of limb-Left foot 01/01/2015  . Vitamin B12 deficiency 06/21/2017    Past Surgical History:  Procedure Laterality Date  . adenocarcinoma  2019   Removed  . CAROTID ENDARTERECTOMY  2005   Left carotid by Dr. Oneida Alar  . CAROTID ENDARTERECTOMY  04/25/11   Right Carotid by Dr. Oneida Alar  . COLONOSCOPY  02/23/2017   Colonic polyp status post polypectomy. Pancolonic diverticulosis predominatly in the sigmoid colon. Tubular adenoma  . Septoplasty, nasal w/ submucosal resection  1960  . SQUAMOUS CELL CARCINOMA EXCISION     pt said numerous times  . TONSILLECTOMY    . VIDEO ASSISTED THORACOSCOPY (VATS)/WEDGE RESECTION Right 11/09/2017   Procedure: RIGHT VIDEO ASSISTED THORACOSCOPY WITH Chales Salmon RESECTION;  Surgeon: [name], MD;  Location: [facility];  Service: Thoracic;  Laterality: Right;    Current Medications: Current Meds  Medication Sig  . acetaminophen (TYLENOL) 500 MG tablet Take 1,000 mg by mouth every 6 (six) hours as needed (for pain.).   [name] aspirin EC 81 MG tablet Take 81 mg by mouth daily.  . clopidogrel (PLAVIX) 75 MG tablet Take 75 mg by mouth at bedtime.   . colesevelam (WELCHOL) 625 MG tablet Take 1,250 mg by mouth 2 (two) times daily with a meal.   . hydrochlorothiazide (HYDRODIURIL) 25 MG tablet Take 25 mg by mouth daily.   [name] losartan (COZAAR) 100 MG tablet Take 50 mg by mouth daily.   . potassium chloride (MICRO-K) 10 MEQ CR capsule Take 10 mEq by mouth daily.  . rosuvastatin (CRESTOR) 40 MG tablet Take 1 tablet (40 mg total) by mouth  daily. (Patient taking differently: Take 40 mg by mouth at bedtime. )  . valACYclovir (VALTREX) 1000 MG tablet Take 1,000 mg by mouth 2 (two) times daily as needed (for fever blisters.).      Allergies:   Patient has no known allergies.   Social History   Socioeconomic History  . Marital status: Married    Spouse name: Not on file  . Number of children: 2  . Years of education: Not on file  . Highest education level: Not on file  Occupational History  . Not on file  Tobacco Use  . Smoking status: Former Smoker    Packs/day: 1.00    Years: 16.00  Pack years: 16.00    Types: Cigarettes    Quit date: 09/26/1972    Years since quitting: 47.6  . Smokeless tobacco: Never Used  Vaping Use  . Vaping Use: Never used  Substance and Sexual Activity  . Alcohol use: Yes    Alcohol/week: 2.0 standard drinks    Types: 2 Glasses of wine per week    Comment:  2-3 Drinks of Scotch per Day  . Drug use: No  . Sexual activity: Not on file  Other Topics Concern  . Not on file  Social History Narrative  . Not on file   Social Determinants of Health   Financial Resource Strain:   . Difficulty of Paying Living Expenses: Not on file  Food Insecurity:   . Worried About Charity fundraiser in the Last Year: Not on file  . Ran Out of Food in the Last Year: Not on file  Transportation Needs:   . Lack of Transportation (Medical): Not on file  . Lack of Transportation (Non-Medical): Not on file  Physical Activity:   . Days of Exercise per Week: Not on file  . Minutes of Exercise per Session: Not on file  Stress:   . Feeling of Stress : Not on file  Social Connections:   . Frequency of Communication with Friends and Family: Not on file  . Frequency of Social Gatherings with Friends and Family: Not on file  . Attends Religious Services: Not on file  . Active Member of Clubs or Organizations: Not on file  . Attends Archivist Meetings: Not on file  . Marital Status: Not on file       Family History: The patient's family history includes Cancer in his brother and sister; Cancer (age of onset: 18) in his mother; Deep vein thrombosis in his mother; Diabetes in his father and mother; Heart attack in his father and mother; Heart disease in his mother; Hyperlipidemia in his mother; Hypertension in his mother; Other in his mother and sister; Stroke in his mother; Varicose Veins in his mother. There is no history of Colon cancer or Esophageal cancer. ROS:   Please see the history of present illness.    All 14 point review of systems negative except as described per history of present illness  EKGs/Labs/Other Studies Reviewed:      Recent Labs: 04/02/2020: BUN 15; Creatinine, Ser 0.93; NT-Pro BNP 327; Potassium 4.0; Sodium 141  Recent Lipid Panel    Component Value Date/Time   CHOL 159 11/21/2018 0836   TRIG 82 11/21/2018 0836   HDL 59 11/21/2018 0836   CHOLHDL 2.7 11/21/2018 0836   LDLCALC 84 11/21/2018 0836    Physical Exam:    VS:  BP 114/62   Pulse 60   Ht 5\' 3"  (1.6 m)   Wt 185 lb 6.4 oz (84.1 kg)   SpO2 96%   BMI 32.84 kg/m     Wt Readings from Last 3 Encounters:  05/15/20 185 lb 6.4 oz (84.1 kg)  04/16/20 188 lb 9.6 oz (85.5 kg)  04/15/20 188 lb 6 oz (85.4 kg)     GEN:  Well nourished, well developed in no acute distress HEENT: Normal NECK: No JVD; No carotid bruits LYMPHATICS: No lymphadenopathy CARDIAC: RRR, no murmurs, no rubs, no gallops RESPIRATORY:  Clear to auscultation without rales, wheezing or rhonchi  ABDOMEN: Soft, non-tender, non-distended MUSCULOSKELETAL:  No edema; No deformity  SKIN: Warm and dry LOWER EXTREMITIES: no swelling NEUROLOGIC:  Alert and oriented  x 3 PSYCHIATRIC:  Normal affect   ASSESSMENT:    1. Coronary artery disease involving native coronary artery of native heart without angina pectoris   2. PAD (peripheral artery disease) ([place])   3. Essential hypertension   4. Mixed hyperlipidemia   5. Ascending aortic  aneurysm (HCC)    PLAN:    In order of problems listed above:  1. Coronary artery disease stable from that point review.  Asymptomatic.  We will continue present approach. 2. Peripheral vascular disease, status post bilateral carotic endarterectomies, follow-up by vascular surgeon from [facility] he is scheduled to have appoint with them soon. 3. Essential hypertension recently his losartan has been decreased because of blood pressure being low and him being dizzy he feels slightly better blood pressure still maintaining at reasonable number.  Continue present management. 4. Mixed dyslipidemia he is on high intensity statin as well as WelChol which I will continue.  I did review his lab work test done by primary care physician at [facility] he is test from 5 months ago showing triglycerides 84 LDL 75 and HDL of 49.  That is an acceptable cholesterol profile we will continue present management. 5. Ascending aortic aneurysm was measuring only 38 mm.  I explained to him what that means and I told him that he does not require any intervention unless it is more than 55 mm of course the key is to control his blood pressure as well as avoidance of isovolumetric exercises.   Medication Adjustments/Labs and Tests Ordered: Current medicines are reviewed at length with the patient today.  Concerns regarding medicines are outlined above.  No orders of the defined types were placed in this encounter.  Medication changes: No orders of the defined types were placed in this encounter.   Signed, Park Liter, MD, [facility] 05/15/2020 9:33 AM    [place]

## 2020-05-15 NOTE — Patient Instructions (Signed)

## 2020-06-17 DIAGNOSIS — N1831 Chronic kidney disease, stage 3a: Secondary | ICD-10-CM | POA: Insufficient documentation

## 2020-06-17 HISTORY — DX: Chronic kidney disease, stage 3a: N18.31

## 2020-06-24 ENCOUNTER — Other Ambulatory Visit: Payer: Self-pay

## 2020-06-24 DIAGNOSIS — I6523 Occlusion and stenosis of bilateral carotid arteries: Secondary | ICD-10-CM

## 2020-07-09 ENCOUNTER — Ambulatory Visit (HOSPITAL_COMMUNITY)
Admission: RE | Admit: 2020-07-09 | Discharge: 2020-07-09 | Disposition: A | Discharge status: Home / Self Care | Payer: Medicare Other | Source: Ambulatory Visit | Attending: Vascular Surgery | Admitting: Vascular Surgery

## 2020-07-09 ENCOUNTER — Ambulatory Visit (INDEPENDENT_AMBULATORY_CARE_PROVIDER_SITE_OTHER): Payer: Medicare Other | Admitting: Physician Assistant

## 2020-07-09 ENCOUNTER — Other Ambulatory Visit: Payer: Self-pay

## 2020-07-09 VITALS — BP 118/72 | HR 55 | Temp 98.1°F | Resp 20 | Ht 63.0 in | Wt 186.0 lb

## 2020-07-09 DIAGNOSIS — I6523 Occlusion and stenosis of bilateral carotid arteries: Secondary | ICD-10-CM | POA: Insufficient documentation

## 2020-07-09 NOTE — Progress Notes (Signed)
Office Note     CC:  follow up Requesting Provider:  Raina Mina., MD  HPI: Louis Barton is a 79 y.o. (01-Aug-1941) male who presents for follow-up bilateral carotid artery stenosis.  He is status post left carotid endarterectomy in 2005 due to symptomatic stenosis.  He subsequently underwent right carotid endarterectomy in 2012.  He denies episodes monocular blindness, slurred speech, facial drooping or extremity weakness.  He denies claudication or rest pain.  He does endorse chronic lower extremity edema.  He underwent bilateral lower extremity venous duplex in July without evidence of DVT.  He has worn compression stockings for most of his adult life.  Patient reports a history of TIA in July, 2005, preoperative to the first CEA, as manifested by left amaurosis fugax, right arm weakness, right facial droop; denies expressive aphasia, denies right leg weakness; all symptoms resolved in about 2 minutes.   He is compliant with statin, aspirin and Plavix.  He is not diabetic.  He quit smoking tobacco in 1974.  On HCTZ and ARB for hypertension. Past Medical History:  Diagnosis Date  . Aftercare following surgery of the circulatory system, [place] 12/19/2013  . Arthritis    DDD- aging   . Atherosclerosis 11/16/2015  . Atypical chest pain 06/11/2015  . Benign paroxysmal positional vertigo 08/08/2016  . Bradycardia 03/18/2015  . CAD (coronary artery disease)   . 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 Carotid stenosis   . Chronic kidney disease    cysts on both kidneys, seeing [name] in [place], [facility]   . Cramps of left lower extremity-Calf  > right calf 01/01/2015  . 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 signficant DDD present with vacuum effect L4-5 and L5-S1 flat discs and anterior spurring noted  . Dyslipidemia 03/18/2015  . Ear itch 05/14/2018    . Edema 11/16/2015  . Edema of both lower extremities due to peripheral venous insufficiency 02/03/2020  . History of hiatal hernia    seen on last scan- as slight   . History of thrombocytopenia   . History of TIAs   . Hyperlipidemia   . Hypertension   . Irritable bowel syndrome 11/17/2016   rec trial of citrucel/ diet 11/16/2016 >>>     . Local edema 02/07/2018  . Malignant neoplasm of left lung ([place]) 12/08/2017   Stage 1A. [place]. And Dr. [name]  . 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 Occlusion and stenosis of carotid artery without mention of cerebral infarction 12/01/2011  . PAD (peripheral artery disease) ([place])   . Prediabetes 12/25/2018  . Preop cardiovascular exam 04/14/2011  . Renal cysts, acquired, bilateral 10/24/2016   Formatting of this note might be different from the original. Renal parapelvic cysts 10/06/16 on CT , right kidney midpole 1.2 cm, right kidney anterior pole 1.1 cm, and to the left kidney lower pole anterior position 3.5cm all felt to be benign  . Right lower lobe pulmonary nodule 11/09/2017  . Shingles    internal- obstruction of bowels & gallbladder  . Solitary pulmonary nodule 11/16/2016   CT ABd 03/24/09 linerar densities in RLL and RML c/w scarring o/w clear CT Abd 10/20/16 c/w 7 mm nodule  - CT chest 02/13/2017 :  slt enlarged to 9 mm   - PET 04/04/17 :1. These slowly enlarging 9 mm right lower  lobe pulmonary nodule along the right hemidiaphragm does not appear hypermetabolic today. Location adjacent to the hemidiaphragm can cause false negatives due to motion artifact related to the  . Stroke ([place])   . Swelling of limb-Left foot 01/01/2015  . Vitamin B12 deficiency 06/21/2017    Past Surgical History:  Procedure Laterality Date  . adenocarcinoma  2019   Removed  . CAROTID ENDARTERECTOMY  2005   Left carotid by Dr. Oneida Alar  . CAROTID ENDARTERECTOMY  04/25/11   Right Carotid by Dr. Oneida Alar  . COLONOSCOPY  02/23/2017   Colonic polyp status post polypectomy. Pancolonic  diverticulosis predominatly in the sigmoid colon. Tubular adenoma  . Septoplasty, nasal w/ submucosal resection  1960  . SQUAMOUS CELL CARCINOMA EXCISION     pt said numerous times  . TONSILLECTOMY    . VIDEO ASSISTED THORACOSCOPY (VATS)/WEDGE RESECTION Right 11/09/2017   Procedure: RIGHT VIDEO ASSISTED THORACOSCOPY WITH Chales Salmon RESECTION;  Surgeon: [name], MD;  Location: [facility];  Service: Thoracic;  Laterality: Right;    Social History   Socioeconomic History  . Marital status: Married    Spouse name: Not on file  . Number of children: 2  . Years of education: Not on file  . Highest education level: Not on file  Occupational History  . Not on file  Tobacco Use  . Smoking status: Former Smoker    Packs/day: 1.00    Years: 16.00    Pack years: 16.00    Types: Cigarettes    Quit date: 09/26/1972    Years since quitting: 47.8  . Smokeless tobacco: Never Used  Vaping Use  . Vaping Use: Never used  Substance and Sexual Activity  . Alcohol use: Yes    Alcohol/week: 2.0 standard drinks    Types: 2 Glasses of wine per week    Comment:  2-3 Drinks of Scotch per Day  . Drug use: No  . Sexual activity: Not on file  Other Topics Concern  . Not on file  Social History Narrative  . Not on file   Social Determinants of Health   Financial Resource Strain:   . Difficulty of Paying Living Expenses: Not on file  Food Insecurity:   . Worried About Charity fundraiser in the Last Year: Not on file  . Ran Out of Food in the Last Year: Not on file  Transportation Needs:   . Lack of Transportation (Medical): Not on file  . Lack of Transportation (Non-Medical): Not on file  Physical Activity:   . Days of Exercise per Week: Not on file  . Minutes of Exercise per Session: Not on file  Stress:   . Feeling of Stress : Not on file  Social Connections:   . Frequency of Communication with Friends and Family: Not on file  . Frequency of Social Gatherings with Friends and Family:  Not on file  . Attends Religious Services: Not on file  . Active Member of Clubs or Organizations: Not on file  . Attends Archivist Meetings: Not on file  . Marital Status: Not on file  Intimate Partner Violence:   . Fear of Current or Ex-Partner: Not on file  . Emotionally Abused: Not on file  . Physically Abused: Not on file  . Sexually Abused: Not on file   Family History  Problem Relation Age of Onset  . Cancer Mother 66       Breast  . Stroke Mother   . Diabetes Mother   .  Hyperlipidemia Mother   . Other Mother        varicose veins  . Heart attack Mother   . Heart disease Mother        Left ankle swelling  . Hypertension Mother   . Deep vein thrombosis Mother   . Varicose Veins Mother   . Diabetes Father   . Heart attack Father   . Other Sister        Tumor in [place]  . Cancer Sister        Tumor   Lung  and  Brain  . Cancer Brother        BCC-SCC-Merkle Cell  . Colon cancer Neg Hx   . Esophageal cancer Neg Hx     Current Outpatient Medications  Medication Sig Dispense Refill  . acetaminophen (TYLENOL) 500 MG tablet Take 1,000 mg by mouth every 6 (six) hours as needed (for pain.).     [name] aspirin EC 81 MG tablet Take 81 mg by mouth daily.    . clopidogrel (PLAVIX) 75 MG tablet Take 75 mg by mouth at bedtime.     . colesevelam (WELCHOL) 625 MG tablet Take 1,250 mg by mouth 2 (two) times daily with a meal.     . hydrochlorothiazide (HYDRODIURIL) 25 MG tablet Take 25 mg by mouth daily.     [name] losartan (COZAAR) 100 MG tablet Take 50 mg by mouth daily.     . potassium chloride (MICRO-K) 10 MEQ CR capsule Take 10 mEq by mouth daily.    . rosuvastatin (CRESTOR) 40 MG tablet Take 1 tablet (40 mg total) by mouth daily. (Patient taking differently: Take 40 mg by mouth at bedtime. ) 30 tablet 6  . valACYclovir (VALTREX) 1000 MG tablet Take 1,000 mg by mouth 2 (two) times daily as needed (for fever blisters.).      No current facility-administered  medications for this visit.    No Known Allergies   REVIEW OF SYSTEMS:   [X]  denotes positive finding, [ ]  denotes negative finding Cardiac  Comments:  Chest pain or chest pressure:    Shortness of breath upon exertion:    Short of breath when lying flat:    Irregular heart rhythm:        Vascular    Pain in calf, thigh, or hip brought on by ambulation:    Pain in feet at night that wakes you up from your sleep:     Blood clot in your veins:    Leg swelling:  x       Pulmonary    Oxygen at home:    Productive cough:     Wheezing:         Neurologic    Sudden weakness in arms or legs:     Sudden numbness in arms or legs:     Sudden onset of difficulty speaking or slurred speech:    Temporary loss of vision in one eye:     Problems with dizziness:         Gastrointestinal    Blood in stool:     Vomited blood:         Genitourinary    Burning when urinating:     Blood in urine:        Psychiatric    Major depression:         Hematologic    Bleeding problems:    Problems with blood clotting too easily:  Skin    Rashes or ulcers:        Constitutional    Fever or chills:     Vitals:   07/09/20 1324 07/09/20 1327  BP: 114/68 118/72  Pulse: (!) 55   Resp: 20   Temp: 98.1 F (36.7 C)   SpO2: 97%    PHYSICAL EXAMINATION: General:  WDWN in NAD; vital signs documented above Gait: Unaided, no ataxia HENT: WNL, normocephalic Pulmonary: normal non-labored breathing , without Rales, rhonchi,  wheezing Cardiac: regular HR, without  Murmurs without carotid bruit Abdomen: soft, NT, no masses Skin: without rashes Vascular Exam/Pulses: 2+ brachial, radial pulses. Extremities: without ischemic changes, without Gangrene , without cellulitis; without open wounds;  Musculoskeletal: no muscle wasting or atrophy  Neurologic: A&O X 3;  No focal weakness or paresthesias are detected Psychiatric:  The pt has Normal affect.   Non-Invasive Vascular Imaging:     07/09/2020 Right Carotid: Velocities in the right ICA are consistent with a 1-39%  stenosis.  Left Carotid: Velocities in the left ICA are consistent with a 1-39%  stenosis.  Vertebrals: Bilateral vertebral arteries demonstrate antegrade flow. Subclavian's demonstrate normal hemodynamics  ASSESSMENT/PLAN:: 79 y.o. male here for follow up for bilateral carotid artery stenosis.  He is status post left and right carotid endarterectomy.  His bilateral carotid duplex is stable as here to 1 year ago.  He is asymptomatic.  Encouraged continued exercise program, adherence to medications and regular evaluation.  We reviewed signs and symptoms of stroke/TIA and advised him to immediate medical attention should these occur.  Barbie Banner, PA-C [facility] [phone]  Clinic MD:   [place]

## 2020-07-24 ENCOUNTER — Other Ambulatory Visit: Payer: Self-pay | Admitting: Hematology and Oncology

## 2020-07-24 DIAGNOSIS — C3431 Malignant neoplasm of lower lobe, right bronchus or lung: Secondary | ICD-10-CM

## 2020-08-05 ENCOUNTER — Ambulatory Visit: Payer: Medicare Other | Admitting: Cardiology

## 2020-08-05 NOTE — Progress Notes (Signed)
[facility]  [address] 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 Here for follow up.  Current Treatment:  Observation   HISTORY OF PRESENT ILLNESS:  SRIKAR CHIANG is a 79 y.o. 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  We began seeing him in March 2019 for follow-up.  He was found to have an enlarging right lower lobe nodule in January 2019 and had a wedge resection by VATS procedure by Dr. Modesto Charon in February.  Pathology revealed an invasive moderately differentiated adenocarcinoma measuring 1 cm with 14 - nodes.  His CEA was elevated at 9 postoperatively.He is on observation only.  He is also being followed by the urologist for multiple renal cysts.  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  Colonoscopy in May 2018 revealed colon polyps with pathology revealing tubular adenomas.  Upper endoscopy and colonoscopy in 2019 did not reveal any malignancy, so we feel we have ruled out a gastrointestinal source as the cause of the increased CEA.  The CEA increased to 10.5 in November, but then was back down to 8.9 in February.  Repeat CT imaging of the chest, abdomen and pelvis did not reveal any evidence of malignancy.  Previously seen right pleural effusion had decreased, there was a similar appearance of the suspected rounded atelectasis at the right lung base, which was also decreased.  CEA at that time was 9.9.  There was stable bilateral renal cysts.  He had surgery of the left lateral and posterior neck for additional squamous cell carcinomas of the skin.  Repeat CT chest, abdomen and pelvis in October revealed stable rounded presumed atelectasis in the right lower lobe along the resection site with stable scarring,  stable volume loss in the right lower lobe and right middle lobe, as well as stable small adjacent right pleural effusion with adjacent pleural thickening without changes suggestive of active malignancy.  The bilateral renal cysts were stable.  CT imaging from May 11 revealed stable appearance of right pleural thickening, trace pleural effusion and adjacent density along the wedge resection site favoring rounded atelectasis and scarring in the right lower lobe.  No compelling findings of active malignancy.  Labs from the same day revealed an unremarkable CBC and CMP.  CEA was 10.5, previously 9.7, but this tends to fluctuate up and down in this range.  He has some residual effects from COVID-19 that he had in November 2020.   INTERVAL HISTORY:  [place] is seen in the  clinic for follow up of his stage Ia (T1 a N0 M0) non-small cell lung cancer. He denies fever, chills, night sweats, or other signs of infection. He denies cardiorespiratory and gastrointestinal issues. He  denies pain. His appetite is good. His weight has decreased 6 pounds over last 6 months. He continues to work full time, although states his energy is much lower than before COVID. He continues to follow with cardiology and dermatology who he will see in December for a full body exam. CBC and CMP are unremarkable today.  REVIEW OF SYSTEMS:  Review of Systems  Constitutional: Negative for appetite change, chills, diaphoresis, fatigue, fever and unexpected weight change.  HENT:   Negative for hearing loss, lump/mass, mouth  sores, nosebleeds, sore throat, tinnitus, trouble swallowing and voice change.   Eyes: Negative for eye problems and icterus.  Respiratory: Negative for chest tightness, cough, hemoptysis, shortness of breath and wheezing.   Cardiovascular: Negative for chest pain, leg swelling and palpitations.  Gastrointestinal: Negative for abdominal distention, abdominal pain, blood in stool, constipation, diarrhea, nausea, rectal  pain and vomiting.  Endocrine: Negative for hot flashes.  Genitourinary: Negative for bladder incontinence, difficulty urinating, dyspareunia, dysuria, frequency, hematuria and nocturia.   Musculoskeletal: Negative for arthralgias, back pain, flank pain, gait problem, myalgias, neck pain and neck stiffness.  Skin: Negative for itching, rash and wound.  Neurological: Negative for dizziness, extremity weakness, gait problem, headaches, light-headedness, numbness, seizures and speech difficulty.  Hematological: Negative for adenopathy. Does not bruise/bleed easily.  Psychiatric/Behavioral: Negative for confusion, decreased concentration, depression, sleep disturbance and suicidal ideas. The patient is not nervous/anxious.      VITALS:  Blood pressure 127/63, pulse (!) 57, temperature 97.8 F (36.6 C), temperature source Oral, resp. rate 18, height 5\' 7"  (1.702 m), weight 185 lb 1.6 oz (84 kg), SpO2 95 %.  Wt Readings from Last 3 Encounters:  08/06/20 185 lb 1.6 oz (84 kg)  07/09/20 186 lb (84.4 kg)  05/15/20 185 lb 6.4 oz (84.1 kg)    Body mass index is 28.99 kg/m.  Performance status (ECOG): 1 - Symptomatic but completely ambulatory  PHYSICAL EXAM:  Physical Exam Constitutional:      General: He is not in acute distress.    Appearance: Normal appearance. He is normal weight. He is not ill-appearing, toxic-appearing or diaphoretic.  HENT:     Head: Normocephalic and atraumatic.     Right Ear: Tympanic membrane normal.     Left Ear: Tympanic membrane normal.     Nose: Nose normal. No congestion or rhinorrhea.     Mouth/Throat:     Mouth: Mucous membranes are moist.     Pharynx: Oropharynx is clear. No oropharyngeal exudate or posterior oropharyngeal erythema.  Eyes:     General: No scleral icterus.       Right eye: No discharge.        Left eye: No discharge.     Extraocular Movements: Extraocular movements intact.     Conjunctiva/sclera: Conjunctivae normal.     Pupils: Pupils  are equal, round, and reactive to light.  Neck:     Vascular: No carotid bruit.  Cardiovascular:     Rate and Rhythm: Normal rate and regular rhythm.     Heart sounds: No murmur heard.  No friction rub. No gallop.   Pulmonary:     Effort: Pulmonary effort is normal. No respiratory distress.     Breath sounds: Normal breath sounds. No stridor. No wheezing, rhonchi or rales.  Chest:     Chest wall: No tenderness.  Abdominal:     General: Abdomen is flat. Bowel sounds are normal. There is no distension.     Palpations: There is no mass.     Tenderness: There is no abdominal tenderness. There is no right CVA tenderness, left CVA tenderness, guarding or rebound.     Hernia: No hernia is present.  Musculoskeletal:        General: No swelling, tenderness, deformity or signs of injury. Normal range of motion.     Cervical back: Normal range of motion and neck supple. No rigidity or tenderness.     Right lower leg: No edema.     Left lower leg: No edema.  Lymphadenopathy:  Cervical: No cervical adenopathy.  Skin:    General: Skin is warm and dry.     Capillary Refill: Capillary refill takes less than 2 seconds.     Coloration: Skin is not jaundiced or pale.     Findings: No bruising, erythema, lesion or rash.  Neurological:     General: No focal deficit present.     Mental Status: He is alert and oriented to person, place, and time. Mental status is at baseline.     Cranial Nerves: No cranial nerve deficit.     Sensory: No sensory deficit.     Motor: No weakness.     Coordination: Coordination normal.     Gait: Gait normal.     Deep Tendon Reflexes: Reflexes normal.  Psychiatric:        Mood and Affect: Mood normal.        Behavior: Behavior normal.        Thought Content: Thought content normal.        Judgment: Judgment normal.    Lymph nodes:   There is no cervical, clavicular, axillary or lymphadenopathy.   LABS:   CBC Latest Ref Rng & Units 08/06/2020 11/11/2017  11/10/2017  WBC 4.0 - 10.5 K/uL 6.3 12.2(H) 14.1(H)  Hemoglobin 13.0 - 17.0 g/dL 15.9 14.0 13.6  Hematocrit 39 - 52 % 47.1 41.6 39.6  Platelets 150 - 400 K/uL 156 123(L) 140(L)   CMP Latest Ref Rng & Units 08/06/2020 04/02/2020 11/21/2018  Glucose 70 - 99 mg/dL 102(H) 114(H) -  BUN 8 - 23 mg/dL 14 15 -  Creatinine 0.61 - 1.24 mg/dL 0.90 0.93 -  Sodium 135 - 145 mmol/L 141 141 -  Potassium 3.5 - 5.1 mmol/L 3.6 4.0 -  Chloride 98 - 111 mmol/L 103 102 -  CO2 22 - 32 mmol/L 30 26 -  Calcium 8.9 - 10.3 mg/dL 8.8(L) 9.2 -  Total Protein 6.5 - 8.1 g/dL 7.1 - 6.7  Total Bilirubin 0.3 - 1.2 mg/dL 0.9 - 0.5  Alkaline Phos 38 - 126 U/L 41 - 60  AST 15 - 41 U/L 23 - 22  ALT 0 - 44 U/L 16 - 15     No results found for: CEA1 / No results found for: CEA1 No results found for: PSA1 No results found for: GYI948 No results found for: CAN125  No results found for: TOTALPROTELP, ALBUMINELP, A1GS, A2GS, BETS, BETA2SER, GAMS, MSPIKE, SPEI No results found for: TIBC, FERRITIN, IRONPCTSAT No results found for: LDH  STUDIES:  VAS US CAROTID  Result Date: 07/09/2020 Carotid Arterial Duplex Study Indications:       Bilateral endarterectomies (Rt- 04/25/2011; Lt- 04/29/2004). Risk Factors:      Hypertension, hyperlipidemia. Comparison Study:  04/11/2019: Rt- 1-39%; Lt- 1-39%. Performing Technologist: Ivan Croft  Examination Guidelines: A complete evaluation includes B-mode imaging, spectral Doppler, color Doppler, and power Doppler as needed of all accessible portions of each vessel. Bilateral testing is considered an integral part of a complete examination. Limited examinations for reoccurring indications may be performed as noted.  Right Carotid Findings: +----------+--------+--------+--------+------------------+--------+           PSV cm/sEDV cm/sStenosisPlaque DescriptionComments +----------+--------+--------+--------+------------------+--------+ CCA Prox  97      19                                          +----------+--------+--------+--------+------------------+--------+ CCA Mid   96  20                                         +----------+--------+--------+--------+------------------+--------+ CCA Distal69      13              heterogenous               +----------+--------+--------+--------+------------------+--------+ ICA Prox  74      15      1-39%   heterogenous               +----------+--------+--------+--------+------------------+--------+ ICA Mid   81      24                                         +----------+--------+--------+--------+------------------+--------+ ICA Distal75      20                                         +----------+--------+--------+--------+------------------+--------+ ECA       89      9               heterogenous               +----------+--------+--------+--------+------------------+--------+ +----------+--------+-------+----------------+-------------------+           PSV cm/sEDV cmsDescribe        Arm Pressure (mmHG) +----------+--------+-------+----------------+-------------------+ [id number]     0      Multiphasic, WNL                    +----------+--------+-------+----------------+-------------------+ +---------+--------+--+--------+-+---------+ VertebralPSV cm/s35EDV cm/s6Antegrade +---------+--------+--+--------+-+---------+  Left Carotid Findings: +----------+--------+--------+--------+------------------+--------+           PSV cm/sEDV cm/sStenosisPlaque DescriptionComments +----------+--------+--------+--------+------------------+--------+ CCA Prox  117     22                                         +----------+--------+--------+--------+------------------+--------+ CCA Mid   103     23              heterogenous               +----------+--------+--------+--------+------------------+--------+ CCA Distal102     22              heterogenous                +----------+--------+--------+--------+------------------+--------+ ICA Prox  42      8       1-39%   heterogenous               +----------+--------+--------+--------+------------------+--------+ ICA Mid   78      21                                         +----------+--------+--------+--------+------------------+--------+ ICA Distal80      23                                         +----------+--------+--------+--------+------------------+--------+  ECA       119     13              heterogenous               +----------+--------+--------+--------+------------------+--------+ +----------+--------+--------+----------------+-------------------+           PSV cm/sEDV cm/sDescribe        Arm Pressure (mmHG) +----------+--------+--------+----------------+-------------------+ Subclavian113             Multiphasic, WNL                    +----------+--------+--------+----------------+-------------------+ +---------+--------+--+--------+-+---------+ VertebralPSV cm/s17EDV cm/s4Antegrade +---------+--------+--+--------+-+---------+   Summary: Right Carotid: Velocities in the right ICA are consistent with a 1-39% stenosis. Left Carotid: Velocities in the left ICA are consistent with a 1-39% stenosis. Vertebrals: Bilateral vertebral arteries demonstrate antegrade flow. *See table(s) above for measurements and observations.  Electronically signed by Deitra Mayo MD on 07/09/2020 at 1:59:26 PM.    Final       HISTORY:   Past Medical History:  Diagnosis Date  . Aftercare following surgery of the circulatory system, [place] 12/19/2013  . Arthritis    DDD- aging   . Atherosclerosis 11/16/2015  . Atypical chest pain 06/11/2015  . Benign paroxysmal positional vertigo 08/08/2016  . Bradycardia 03/18/2015  . CAD (coronary artery disease)   . 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 Carotid stenosis   . Chronic kidney disease     cysts on both kidneys, seeing [name] in [place], [facility]   . Cramps of left lower extremity-Calf  > right calf 01/01/2015  . 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 signficant DDD present with vacuum effect L4-5 and L5-S1 flat discs and anterior spurring noted  . Dyslipidemia 03/18/2015  . Ear itch 05/14/2018  . Edema 11/16/2015  . Edema of both lower extremities due to peripheral venous insufficiency 02/03/2020  . History of hiatal hernia    seen on last scan- as slight   . History of thrombocytopenia   . History of TIAs   . Hyperlipidemia   . Hypertension   . Irritable bowel syndrome 11/17/2016   rec trial of citrucel/ diet 11/16/2016 >>>     . Local edema 02/07/2018  . Malignant neoplasm of left lung ([place]) 12/08/2017   Stage 1A. [place]. And Dr. [name]  . 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 Occlusion and stenosis of carotid artery without mention of cerebral infarction 12/01/2011  . PAD (peripheral artery disease) ([place])   . Prediabetes 12/25/2018  . Preop cardiovascular exam 04/14/2011  . Renal cysts, acquired, bilateral 10/24/2016   Formatting of this note might be different from the original. Renal parapelvic cysts 10/06/16 on CT , right kidney midpole 1.2 cm, right kidney anterior pole 1.1 cm, and to the left kidney lower pole anterior position 3.5cm all felt to be benign  . Right lower lobe pulmonary nodule 11/09/2017  . Shingles    internal- obstruction of bowels & gallbladder  . Solitary pulmonary nodule 11/16/2016   CT ABd 03/24/09 linerar densities in RLL and RML c/w scarring o/w clear CT Abd 10/20/16 c/w 7 mm nodule  - CT chest 02/13/2017 :  slt enlarged to 9 mm   - PET 04/04/17 :1. These slowly enlarging 9 mm right lower lobe pulmonary nodule along the right hemidiaphragm does not  appear hypermetabolic today. Location adjacent to the hemidiaphragm can cause false negatives due to motion artifact related to the   . Stroke ([place])   . Swelling of limb-Left foot 01/01/2015  . Vitamin B12 deficiency 06/21/2017    Past Surgical History:  Procedure Laterality Date  . adenocarcinoma  2019   Removed  . CAROTID ENDARTERECTOMY  2005   Left carotid by Dr. Oneida Alar  . CAROTID ENDARTERECTOMY  04/25/11   Right Carotid by Dr. Oneida Alar  . COLONOSCOPY  02/23/2017   Colonic polyp status post polypectomy. Pancolonic diverticulosis predominatly in the sigmoid colon. Tubular adenoma  . Septoplasty, nasal w/ submucosal resection  1960  . SQUAMOUS CELL CARCINOMA EXCISION     pt said numerous times  . TONSILLECTOMY    . VIDEO ASSISTED THORACOSCOPY (VATS)/WEDGE RESECTION Right 11/09/2017   Procedure: RIGHT VIDEO ASSISTED THORACOSCOPY WITH Chales Salmon RESECTION;  Surgeon: [name], MD;  Location: [facility] OR;  Service: Thoracic;  Laterality: Right;    Family History  Problem Relation Age of Onset  . Cancer Mother 82       Breast  . Stroke Mother   . Diabetes Mother   . Hyperlipidemia Mother   . Other Mother        varicose veins  . Heart attack Mother   . Heart disease Mother        Left ankle swelling  . Hypertension Mother   . Deep vein thrombosis Mother   . Varicose Veins Mother   . Diabetes Father   . Heart attack Father   . Other Sister        Tumor in [place]  . Cancer Sister        Tumor   Lung  and  Brain  . Cancer Brother        BCC-SCC-Merkle Cell  . Colon cancer Neg Hx   . Esophageal cancer Neg Hx     Social History:  reports that he quit smoking about 47 years ago. His smoking use included cigarettes. He has a 16.00 pack-year smoking history. He has never used smokeless tobacco. He reports current alcohol use of about 2.0 standard drinks of alcohol per week. He reports that he does not use drugs.The patient is alone  today.  Allergies: No Known Allergies  Current Medications: Current Outpatient Medications  Medication Sig Dispense Refill  . acetaminophen (TYLENOL) 500 MG tablet  Take 1,000 mg by mouth every 6 (six) hours as needed (for pain.).     [name] aspirin EC 81 MG tablet Take 81 mg by mouth daily.    . clopidogrel (PLAVIX) 75 MG tablet Take 75 mg by mouth at bedtime.     . colesevelam (WELCHOL) 625 MG tablet Take 1,250 mg by mouth 2 (two) times daily with a meal.     . hydrochlorothiazide (HYDRODIURIL) 25 MG tablet Take 25 mg by mouth daily.     [name] losartan (COZAAR) 100 MG tablet Take 50 mg by mouth daily.     . potassium chloride (MICRO-K) 10 MEQ CR capsule Take 10 mEq by mouth daily.    . rosuvastatin (CRESTOR) 40 MG tablet Take 1 tablet (40 mg total) by mouth daily. (Patient taking differently: Take 40 mg by mouth at bedtime. ) 30 tablet 6  . valACYclovir (VALTREX) 1000 MG tablet Take 1,000 mg by mouth 2 (two) times daily as needed (for fever blisters.).      No current facility-administered medications for this visit.     ASSESSMENT &  PLAN:   Assessment:  GARFIELD COINER is a 79 y.o. male with stage Ia non-small cell lung cancer treated with surgery.  He remains without evidence of disease.  He has multiple renal cysts being monitored by Dr. Tamala Julian, his urologist with [facility].  He also has multiple skin cancers for which he had a recent squamous cell carcinoma resected from the upper right anterior chest.  History of COVID-19 from November 2020 with mild residual brain fog and bilateral lower extremity weakness, but no other obvious sequelae.  Multiple colon polyps. He continues to work full time, though does have decreased energy.   Plan: We will repeat CT imaging in May 2022 unless indicated earlier.  He will continue to follow with Dr. Tamala Julian for his renal cysts.  He will continue to follow with Dr. Lyndel Safe for multiple colon polyps. He will see his dermatologist in December.  He will return to clinic in 6 months for repeat CBC, CMP, CEA and evaluation.  The patient verbalizes understanding of and agreement to the plan as discussed today.  He knows to call the office  should any new questions or concerns arise.  The patient understands the plans discussed today and is in agreement with them.    The patient knows to contact our office if his develops concerns prior to his next appointment.   I provided 30 minutes (2:09 PM - 2:09 PM) of face-to-face time during this this encounter and > 50% was spent counseling as documented under my assessment and plan.    Melodye Ped, NP

## 2020-08-06 ENCOUNTER — Encounter: Payer: Self-pay | Admitting: Hematology and Oncology

## 2020-08-06 ENCOUNTER — Inpatient Hospital Stay: Payer: Medicare Other

## 2020-08-06 ENCOUNTER — Inpatient Hospital Stay: Payer: Medicare Other | Attending: Hematology and Oncology | Admitting: Hematology and Oncology

## 2020-08-06 ENCOUNTER — Other Ambulatory Visit: Payer: Medicare Other

## 2020-08-06 ENCOUNTER — Ambulatory Visit: Payer: Medicare Other | Admitting: Hematology and Oncology

## 2020-08-06 ENCOUNTER — Other Ambulatory Visit: Payer: Self-pay

## 2020-08-06 ENCOUNTER — Telehealth: Payer: Self-pay | Admitting: Oncology

## 2020-08-06 DIAGNOSIS — E538 Deficiency of other specified B group vitamins: Secondary | ICD-10-CM

## 2020-08-06 DIAGNOSIS — Z86010 Personal history of colonic polyps: Secondary | ICD-10-CM | POA: Diagnosis not present

## 2020-08-06 DIAGNOSIS — C3492 Malignant neoplasm of unspecified part of left bronchus or lung: Secondary | ICD-10-CM

## 2020-08-06 DIAGNOSIS — I10 Essential (primary) hypertension: Secondary | ICD-10-CM

## 2020-08-06 DIAGNOSIS — Z87891 Personal history of nicotine dependence: Secondary | ICD-10-CM | POA: Insufficient documentation

## 2020-08-06 DIAGNOSIS — C3490 Malignant neoplasm of unspecified part of unspecified bronchus or lung: Secondary | ICD-10-CM

## 2020-08-06 DIAGNOSIS — Z79899 Other long term (current) drug therapy: Secondary | ICD-10-CM | POA: Insufficient documentation

## 2020-08-06 DIAGNOSIS — C3431 Malignant neoplasm of lower lobe, right bronchus or lung: Secondary | ICD-10-CM | POA: Insufficient documentation

## 2020-08-06 DIAGNOSIS — N189 Chronic kidney disease, unspecified: Secondary | ICD-10-CM | POA: Diagnosis not present

## 2020-08-06 DIAGNOSIS — Z8673 Personal history of transient ischemic attack (TIA), and cerebral infarction without residual deficits: Secondary | ICD-10-CM | POA: Diagnosis not present

## 2020-08-06 DIAGNOSIS — I129 Hypertensive chronic kidney disease with stage 1 through stage 4 chronic kidney disease, or unspecified chronic kidney disease: Secondary | ICD-10-CM | POA: Insufficient documentation

## 2020-08-06 LAB — CBC WITH DIFFERENTIAL (CANCER CENTER ONLY)
Abs Immature Granulocytes: 0.01 10*3/uL (ref 0.00–0.07)
Basophils Absolute: 0 10*3/uL (ref 0.0–0.1)
Basophils Relative: 1 %
Eosinophils Absolute: 0.2 10*3/uL (ref 0.0–0.5)
Eosinophils Relative: 3 %
HCT: 47.1 % (ref 39.0–52.0)
Hemoglobin: 15.9 g/dL (ref 13.0–17.0)
Immature Granulocytes: 0 %
Lymphocytes Relative: 30 %
Lymphs Abs: 1.9 10*3/uL (ref 0.7–4.0)
MCH: 32.1 pg (ref 26.0–34.0)
MCHC: 33.8 g/dL (ref 30.0–36.0)
MCV: 95 fL (ref 80.0–100.0)
Monocytes Absolute: 0.7 10*3/uL (ref 0.1–1.0)
Monocytes Relative: 10 %
Neutro Abs: 3.5 10*3/uL (ref 1.7–7.7)
Neutrophils Relative %: 56 %
Platelet Count: 156 10*3/uL (ref 150–400)
RBC: 4.96 MIL/uL (ref 4.22–5.81)
RDW: 13.7 % (ref 11.5–15.5)
WBC Count: 6.3 10*3/uL (ref 4.0–10.5)
nRBC: 0 % (ref 0.0–0.2)

## 2020-08-06 LAB — CMP (CANCER CENTER ONLY)
ALT: 16 U/L (ref 0–44)
AST: 23 U/L (ref 15–41)
Albumin: 3.9 g/dL (ref 3.5–5.0)
Alkaline Phosphatase: 41 U/L (ref 38–126)
Anion gap: 8 (ref 5–15)
BUN: 14 mg/dL (ref 8–23)
CO2: 30 mmol/L (ref 22–32)
Calcium: 8.8 mg/dL — ABNORMAL LOW (ref 8.9–10.3)
Chloride: 103 mmol/L (ref 98–111)
Creatinine: 0.9 mg/dL (ref 0.61–1.24)
GFR, Estimated: >60 mL/min (ref >60)
Glucose, Bld: 102 mg/dL — ABNORMAL HIGH (ref 70–99)
Potassium: 3.6 mmol/L (ref 3.5–5.1)
Sodium: 141 mmol/L (ref 135–145)
Total Bilirubin: 0.9 mg/dL (ref 0.3–1.2)
Total Protein: 7.1 g/dL (ref 6.5–8.1)

## 2020-08-06 NOTE — Telephone Encounter (Signed)
Per 11/11 LOS.APPT GIVEN TO PATIENT

## 2020-08-07 NOTE — Addendum Note (Signed)
Addended by: Dayton Scrape on: 08/07/2020 02:09 PM   Modules accepted: Orders

## 2020-08-10 ENCOUNTER — Other Ambulatory Visit: Payer: Self-pay | Admitting: Hematology and Oncology

## 2020-08-10 DIAGNOSIS — C3431 Malignant neoplasm of lower lobe, right bronchus or lung: Secondary | ICD-10-CM | POA: Diagnosis not present

## 2020-08-10 DIAGNOSIS — C3490 Malignant neoplasm of unspecified part of unspecified bronchus or lung: Secondary | ICD-10-CM

## 2020-08-11 LAB — CEA: CEA: 10.3 ng/mL — ABNORMAL HIGH (ref 0.0–4.7)

## 2020-10-20 ENCOUNTER — Ambulatory Visit (INDEPENDENT_AMBULATORY_CARE_PROVIDER_SITE_OTHER): Payer: Medicare Other | Admitting: Cardiology

## 2020-10-20 ENCOUNTER — Encounter: Payer: Self-pay | Admitting: Cardiology

## 2020-10-20 ENCOUNTER — Ambulatory Visit (INDEPENDENT_AMBULATORY_CARE_PROVIDER_SITE_OTHER): Payer: Medicare Other

## 2020-10-20 ENCOUNTER — Other Ambulatory Visit: Payer: Self-pay

## 2020-10-20 VITALS — BP 110/76 | HR 56 | Ht 67.0 in | Wt 190.0 lb

## 2020-10-20 DIAGNOSIS — I440 Atrioventricular block, first degree: Secondary | ICD-10-CM

## 2020-10-20 DIAGNOSIS — I2510 Atherosclerotic heart disease of native coronary artery without angina pectoris: Secondary | ICD-10-CM | POA: Diagnosis not present

## 2020-10-20 DIAGNOSIS — R002 Palpitations: Secondary | ICD-10-CM

## 2020-10-20 DIAGNOSIS — I6523 Occlusion and stenosis of bilateral carotid arteries: Secondary | ICD-10-CM | POA: Diagnosis not present

## 2020-10-20 DIAGNOSIS — I4510 Unspecified right bundle-branch block: Secondary | ICD-10-CM | POA: Insufficient documentation

## 2020-10-20 DIAGNOSIS — C3492 Malignant neoplasm of unspecified part of left bronchus or lung: Secondary | ICD-10-CM

## 2020-10-20 HISTORY — DX: Unspecified right bundle-branch block: I45.10

## 2020-10-20 HISTORY — DX: Atrioventricular block, first degree: I44.0

## 2020-10-20 NOTE — Progress Notes (Signed)
Cardiology Office Note:    Date:  10/20/2020   ID:  Louis Barton, DOB June 09, 1941, MRN [id number]  PCP:  Louis Mina., MD  Cardiologist:  Louis Campus, MD    Referring MD: Louis Mina., MD   Chief Complaint  Patient presents with  . Follow-up  I am doing fine  History of Present Illness:    Louis Barton is a 80 y.o. male with past medical history significant for remote coronary artery disease, peripheral vascular disease in form of bilateral carotic arterial disease status post carotic endarterectomy, essential hypertension, dyslipidemia, history of lung CA.  Comes today to my for follow-up.  Recently we concentrated our investigation of swollen legs.  proBNP was normal, echocardiogram was done which showed preserved ejection fraction, no cardiac explanation for his swelling.  He did have DVT study done on the leg which showed no DVT. He comes today 2 months of follow-up he complained of being weak tired exhausted.  Since I seen him last time he did not go to gym and there are multiple reasons why he did not do it latest reason is the fact that he had some dermatological procedure done to multiple skin lesions removed from his forehead as well as from his face.  He still have some scabs in those areas.  He complained of having shortness of breath interestingly shortness of breath typically happen when he start walking and then when he keep going shortness of breath get dramatically better and then he can continue.  He is kind of slow in responses today he thinks long time before answering questions.  He complained of still having some brain fog after Covid infection that he suffered from few months ago.  Still gets some dizziness look like typical vertigo he said when he turns from side-to-side laying in the bed room start spinning no passing out.  No falls.  Past Medical History:  Diagnosis Date  . Aftercare following surgery of the circulatory system, [place] 12/19/2013  . Arthritis     DDD- aging   . Ascending aortic aneurysm ([place]) 05/15/2020  . Atherosclerosis 11/16/2015  . Atypical chest pain 06/11/2015  . Benign paroxysmal positional vertigo 08/08/2016  . Bradycardia 03/18/2015  . CAD (coronary artery disease)   . 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 Carotid stenosis   . Chronic kidney disease    cysts on both kidneys, seeing Louis Barton in [place], [facility]   . Cramps of left lower extremity-Calf  > right calf 01/01/2015  . 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 signficant DDD present with vacuum effect L4-5 and L5-S1 flat discs and anterior spurring noted  . Dizziness 05/06/2020  . Dyslipidemia 03/18/2015  . Dyspnea on exertion 04/02/2020  . Ear itch 05/14/2018  . Edema 11/16/2015  . Edema of both lower extremities due to peripheral venous insufficiency 02/03/2020  . History of hiatal hernia    seen on last scan- as slight   . History of thrombocytopenia   . History of TIAs   . Hyperlipidemia   . Hypertension   . Irritable bowel syndrome 11/17/2016   rec trial of citrucel/ diet 11/16/2016 >>>     . Local edema 02/07/2018  . 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 Louis Barton. And Dr. Melvyn Barton  . MRSA  carrier 09/28/2016  . Occlusion and stenosis of carotid artery without mention of cerebral infarction 12/01/2011  . PAD (peripheral artery disease) ([place])   . Prediabetes 12/25/2018  . Preop cardiovascular exam 04/14/2011  . Renal cysts, acquired, bilateral 10/24/2016   Formatting of this note might be different from the original. Renal parapelvic cysts 10/06/16 on CT , right kidney midpole 1.2 cm, right kidney anterior pole 1.1 cm, and to the left kidney lower pole anterior position 3.5cm all felt to be benign  . Right lower lobe pulmonary nodule 11/09/2017  . Shingles    internal- obstruction of bowels & gallbladder  . Solitary  pulmonary nodule 11/16/2016   CT ABd 03/24/09 linerar densities in RLL and RML c/w scarring o/w clear CT Abd 10/20/16 c/w 7 mm nodule  - CT chest 02/13/2017 :  slt enlarged to 9 mm   - PET 04/04/17 :1. These slowly enlarging 9 mm right lower lobe pulmonary nodule along the right hemidiaphragm does not appear hypermetabolic today. Location adjacent to the hemidiaphragm can cause false negatives due to motion artifact related to the  . Stage 3a chronic kidney disease ([place]) 06/17/2020  . Stroke ([place])   . Swelling of limb-Left foot 01/01/2015  . Vitamin B12 deficiency 06/21/2017    Past Surgical History:  Procedure Laterality Date  . adenocarcinoma  2019   Removed  . CAROTID ENDARTERECTOMY  2005   Left carotid by Louis Barton  . CAROTID ENDARTERECTOMY  04/25/11   Right Carotid by Louis Barton  . COLONOSCOPY  02/23/2017   Colonic polyp status post polypectomy. Pancolonic diverticulosis predominatly in the sigmoid colon. Tubular adenoma  . Septoplasty, nasal w/ submucosal resection  1960  . SQUAMOUS CELL CARCINOMA EXCISION     pt said numerous times  . TONSILLECTOMY    . VIDEO ASSISTED THORACOSCOPY (VATS)/WEDGE RESECTION Right 11/09/2017   Procedure: RIGHT VIDEO ASSISTED THORACOSCOPY WITH Chales Barton RESECTION;  Surgeon: Louis Nakayama, MD;  Location: [facility];  Service: Thoracic;  Laterality: Right;    Current Medications: Current Meds  Medication Sig  . acetaminophen (TYLENOL) 500 MG tablet Take 1,000 mg by mouth every 6 (six) hours as needed (for pain.).   [name] aspirin EC 81 MG tablet Take 81 mg by mouth daily.  . calcium-vitamin D (OSCAL WITH D) 500-200 MG-UNIT tablet Take 1 tablet by mouth.  . clopidogrel (PLAVIX) 75 MG tablet Take 75 mg by mouth at bedtime.   . colesevelam (WELCHOL) 625 MG tablet Take 1,250 mg by mouth 2 (two) times daily with a meal.  . hydrochlorothiazide (HYDRODIURIL) 25 MG tablet Take 25 mg by mouth daily.   [name] losartan (COZAAR) 100 MG tablet Take 100 mg by mouth daily.  [name]  POTASSIUM PO Take by mouth daily.  . rosuvastatin (CRESTOR) 40 MG tablet Take 1 tablet (40 mg total) by mouth daily.  . valACYclovir (VALTREX) 1000 MG tablet Take 1,000 mg by mouth 2 (two) times daily as needed (for fever blisters.).   [name] zinc gluconate 50 MG tablet Take 50 mg by mouth daily.  . [DISCONTINUED] potassium chloride (KLOR-CON) 10 MEQ tablet Take 10 mEq by mouth once.     Allergies:   Patient has no known allergies.   Social History   Socioeconomic History  . Marital status: Married    Spouse name: Not on file  . Number of children: 2  . Years of education: Not on file  . Highest education level: Not on file  Occupational History  . Not on  file  Tobacco Use  . Smoking status: Former Smoker    Packs/day: 1.00    Years: 16.00    Pack years: 16.00    Types: Cigarettes    Quit date: 09/26/1972    Years since quitting: 48.0  . Smokeless tobacco: Never Used  Vaping Use  . Vaping Use: Never used  Substance and Sexual Activity  . Alcohol use: Yes    Alcohol/week: 2.0 standard drinks    Types: 2 Glasses of wine per week    Comment:  2-3 Drinks of Scotch per Day  . Drug use: No  . Sexual activity: Not on file  Other Topics Concern  . Not on file  Social History Narrative  . Not on file   Social Determinants of Health   Financial Resource Strain: Not on file  Food Insecurity: Not on file  Transportation Needs: Not on file  Physical Activity: Not on file  Stress: Not on file  Social Connections: Not on file     Family History: The patient's family history includes Cancer in his brother and sister; Cancer (age of onset: 58) in his mother; Deep vein thrombosis in his mother; Diabetes in his father and mother; Heart attack in his father and mother; Heart disease in his mother; Hyperlipidemia in his mother; Hypertension in his mother; Other in his mother and sister; Stroke in his mother; Varicose Veins in his mother. There is no history of Colon cancer or Esophageal  cancer. ROS:   Please see the history of present illness.    All 14 point review of systems negative except as described per history of present illness  EKGs/Labs/Other Studies Reviewed:      Recent Labs: 04/02/2020: NT-Pro BNP 327 08/06/2020: ALT 16; BUN 14; Creatinine 0.90; Hemoglobin 15.9; Platelet Count 156; Potassium 3.6; Sodium 141  Recent Lipid Panel    Component Value Date/Time   CHOL 159 11/21/2018 0836   TRIG 82 11/21/2018 0836   HDL 59 11/21/2018 0836   CHOLHDL 2.7 11/21/2018 0836   LDLCALC 84 11/21/2018 0836    Physical Exam:    VS:  BP 110/76 (BP Location: Right Arm, Patient Position: Sitting, Cuff Size: Normal)   Pulse (!) 56   Ht 5\' 7"  (1.702 m)   Wt 190 lb (86.2 kg)   SpO2 96%   BMI 29.76 kg/m     Wt Readings from Last 3 Encounters:  10/20/20 190 lb (86.2 kg)  08/06/20 185 lb 1.6 oz (84 kg)  07/09/20 186 lb (84.4 kg)     GEN:  Well nourished, well developed in no acute distress HEENT: Normal NECK: No JVD; No carotid bruits LYMPHATICS: No lymphadenopathy CARDIAC: RRR, no murmurs, no rubs, no gallops RESPIRATORY:  Clear to auscultation without rales, wheezing or rhonchi  ABDOMEN: Soft, non-tender, non-distended MUSCULOSKELETAL:  No edema; No deformity  SKIN: Warm and dry LOWER EXTREMITIES: no swelling NEUROLOGIC:  Alert and oriented x 3 PSYCHIATRIC:  Normal affect   ASSESSMENT:    1. Bilateral carotid artery stenosis   2. Right bundle branch block   3. First degree AV block   4. Coronary artery disease involving native coronary artery of native heart without angina pectoris   5. Malignant neoplasm of left lung, unspecified part of lung ([place])    PLAN:    In order of problems listed above:  1. Right bundle branch with first-degree AV block.  EKG showed that today.  Previously had incomplete right bundle branch block he is also bradycardic with  heart rate of 56 on the EKG.  Asking to wear Zio patch for week 1 to make sure he does not have worse  problem with conduction system. 2. Bilateral carotic arterial stenosis carotic ultrasounds reviewed showing no evidence of significant stenosis. 3. Carotid artery disease stable asymptomatic continue present management. 4. Malignant lung cancer that being followed by internal medicine team. 5. Dyslipidemia: He is on WelChol as well as Crestor I did review his K PN/care everywhere he got last fasting lipid profile done in 2021 February showing LDL of 75 and HDL 49.  We will recheck his cholesterol.   Medication Adjustments/Labs and Tests Ordered: Current medicines are reviewed at length with the patient today.  Concerns regarding medicines are outlined above.  No orders of the defined types were placed in this encounter.  Medication changes: No orders of the defined types were placed in this encounter.   Signed, Park Liter, MD, [facility] 10/20/2020 8:40 AM    [place]

## 2020-10-20 NOTE — Patient Instructions (Signed)
Medication Instructions:  Your physician recommends that you continue on your current medications as directed. Please refer to the Current Medication list given to you today.  *If you need a refill on your cardiac medications before your next appointment, please call your pharmacy*   Lab Work: None If you have labs (blood work) drawn today and your tests are completely normal, you will receive your results only by: [name] MyChart Message (if you have MyChart) OR . A paper copy in the mail If you have any lab test that is abnormal or we need to change your treatment, we will call you to review the results.   Testing/Procedures: A zio monitor was ordered today. It will remain on for 7 days. You will then return monitor and event diary in provided box. It takes 1-2 weeks for report to be downloaded and returned to [place]. We will call you with the results. If monitor falls off or has orange flashing light, please call Zio for further instructions.      Follow-Up: At [facility], you and your health needs are our priority.  As part of our continuing mission to provide you with exceptional heart care, we have created designated Provider Care Teams.  These Care Teams include your primary Cardiologist (physician) and Advanced Practice Providers (APPs -  Physician Assistants and Nurse Practitioners) who all work together to provide you with the care you need, when you need it.  We recommend signing up for the patient portal called "MyChart".  Sign up information is provided on this After Visit Summary.  MyChart is used to connect with patients for Virtual Visits (Telemedicine).  Patients are able to view lab/test results, encounter notes, upcoming appointments, etc.  Non-urgent messages can be sent to your provider as well.   To learn more about what you can do with MyChart, go to [email or web address].    Your next appointment:   3 month(s)  The format for your next appointment:   In  Person  Provider:   Jenne Campus, MD   Other Instructions

## 2020-10-27 DIAGNOSIS — R002 Palpitations: Secondary | ICD-10-CM | POA: Diagnosis not present

## 2020-12-08 ENCOUNTER — Telehealth: Payer: Self-pay | Admitting: Cardiology

## 2020-12-08 ENCOUNTER — Telehealth: Payer: Self-pay

## 2020-12-08 DIAGNOSIS — R5383 Other fatigue: Secondary | ICD-10-CM

## 2020-12-08 DIAGNOSIS — R5381 Other malaise: Secondary | ICD-10-CM | POA: Insufficient documentation

## 2020-12-08 HISTORY — DX: Other malaise: R53.81

## 2020-12-08 HISTORY — DX: Other fatigue: R53.83

## 2020-12-08 NOTE — Telephone Encounter (Signed)
Patient states "When I first stand it feels like all the blood rushes from my head to my body. I have checked my blood pressure 115 over a low number. My heart rate has been 40-42 it is normally 50-55. Dr. Raliegh Ip has talked about maybe having to put a pace maker in. I just want to talk to him about everything." Appointment will be made for patient.

## 2020-12-08 NOTE — Telephone Encounter (Signed)
Yes

## 2020-12-08 NOTE — Telephone Encounter (Signed)
Spoke with patient about moving his appointment up from 02/04/2021. After looking and being unsure about double booking. Patient told [name] nurse will call to set up an appointment and was encouraged to change positions slowly. Patient verbalizes understanding. He has not further questions or concerns at this time. He states "I look forward to the call." Will forward information to [name] nurse.

## 2020-12-08 NOTE — Telephone Encounter (Signed)
Hey, I spoke with him. He wants to move his appointment up from 5/12 but I did not know how you fall feel about double booking and using a new patient, 24 hour or 48 hour slot. I told the patient you would reach back out to him. I can call him tomorrow if need be, I just do not know how you all go about booking. Thank you in advance.

## 2020-12-08 NOTE — Telephone Encounter (Signed)
STAT if HR is under 50 or over 120 (normal HR is 60-100 beats per minute)  1) What is your heart rate? Running from 40 to about 55  2) Do you have a log of your heart rate readings (document readings)? no  3) Do you have any other symptoms? Dizzy patient is asking for a apt and to speak with a nurse

## 2020-12-09 NOTE — Telephone Encounter (Signed)
Called patient scheduled him for next week. No further questions.

## 2020-12-09 NOTE — Telephone Encounter (Signed)
Please overbook double book him to make sure I will see him when he wants to be seen.

## 2020-12-15 ENCOUNTER — Ambulatory Visit: Payer: Medicare Other | Admitting: Cardiology

## 2020-12-24 ENCOUNTER — Other Ambulatory Visit: Payer: Self-pay

## 2020-12-25 ENCOUNTER — Ambulatory Visit (INDEPENDENT_AMBULATORY_CARE_PROVIDER_SITE_OTHER): Payer: Medicare Other | Admitting: Cardiology

## 2020-12-25 ENCOUNTER — Encounter: Payer: Self-pay | Admitting: Cardiology

## 2020-12-25 ENCOUNTER — Other Ambulatory Visit: Payer: Self-pay

## 2020-12-25 VITALS — BP 108/60 | HR 58 | Ht 67.0 in | Wt 189.0 lb

## 2020-12-25 DIAGNOSIS — R079 Chest pain, unspecified: Secondary | ICD-10-CM | POA: Diagnosis not present

## 2020-12-25 DIAGNOSIS — I739 Peripheral vascular disease, unspecified: Secondary | ICD-10-CM

## 2020-12-25 DIAGNOSIS — I440 Atrioventricular block, first degree: Secondary | ICD-10-CM | POA: Diagnosis not present

## 2020-12-25 DIAGNOSIS — I6523 Occlusion and stenosis of bilateral carotid arteries: Secondary | ICD-10-CM | POA: Diagnosis not present

## 2020-12-25 DIAGNOSIS — I2510 Atherosclerotic heart disease of native coronary artery without angina pectoris: Secondary | ICD-10-CM | POA: Diagnosis not present

## 2020-12-25 NOTE — Progress Notes (Signed)
Cardiology Office Note:    Date:  12/25/2020   ID:  [name], DOB Jun 15, 1941, MRN [id number]  PCP:  Raina Mina., MD  Cardiologist:  Jenne Campus, MD    Referring MD: Raina Mina., MD   Chief Complaint  Patient presents with  . Follow-up  I am still not doing well  History of Present Illness:    Louis Barton is a 80 y.o. male with past medical history significant for remote coronary artery disease, peripheral vascular disease in form of bilateral carotic arterial disease, status post carotic endarterectomy, essential hypertension, dyslipidemia, history of lung CA.  Comes today 2 months of follow-up he still not doing well he complained of being weak tired exhausted he used to go to gym on the regular basis but lately because of some work commitment he does not have time to do that.  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  He did wear monitor which showed first-degree AV block as well as second-degree type I AV block.  He check his blood pressure on the regular basis there were times that his blood pressure was low and his primary care physician very appropriately reduce the dose of ARB.  In spite of that he still does some episode of dizziness that combination of some orthostatic hypotension I suspect as well as vertigo.  Turning head either direction can trigger this sensation.  On top of that he gets up quickly especially morning he will feel dizziness I asked him to stay well-hydrated.  Described to have some shortness of breath fatigue tiredness uneasy sensation in the chest.  I think we reached the point that he need to be reevaluated for activation of coronary artery disease.  Past Medical History:  Diagnosis Date  . Aftercare following surgery of the circulatory system, [place] 12/19/2013  . Arthritis    DDD- aging   . 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 Atherosclerosis 11/16/2015  . Atypical  chest pain 06/11/2015  . Benign paroxysmal positional vertigo 08/08/2016  . Bradycardia 03/18/2015  . CAD (coronary artery disease)   . 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 Carotid stenosis   . Chronic kidney disease    cysts on both kidneys, seeing [name] in [place], [facility] [place]   . Cramps of left lower extremity-Calf  > right calf 01/01/2015  . 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 signficant DDD present with vacuum effect L4-5 and L5-S1 flat discs and anterior spurring noted  . Dizziness 05/06/2020  . Dyslipidemia 03/18/2015  . Dyspnea on exertion 04/02/2020  . Ear itch 05/14/2018  . Edema 11/16/2015  . Edema of both lower extremities due to peripheral venous insufficiency 02/03/2020  . First degree AV block 10/20/2020  . History of hiatal hernia    seen on last scan- as slight   . History of thrombocytopenia   . History of TIAs   . Hyperlipidemia   . Hypertension   . Irritable bowel syndrome 11/17/2016   rec trial of citrucel/ diet 11/16/2016 >>>     . Local edema 02/07/2018  . Malaise and fatigue 12/08/2020  . 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 [place]. And Dr. [name]  .  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 Occlusion and stenosis of carotid artery without mention of cerebral infarction 12/01/2011  . PAD (peripheral artery disease) ([place])   . Prediabetes 12/25/2018  . Preop cardiovascular exam 04/14/2011  . Renal cysts, acquired, bilateral 10/24/2016   Formatting of this note might be different from the original. Renal parapelvic cysts 10/06/16 on CT , right kidney midpole 1.2 cm, right kidney anterior pole 1.1 cm, and to the left kidney lower pole anterior position 3.5cm all felt to be benign  . Right bundle branch block 10/20/2020  . Right lower lobe pulmonary nodule 11/09/2017  . Shingles    internal- obstruction of bowels & gallbladder  .  Solitary pulmonary nodule 11/16/2016   CT ABd 03/24/09 linerar densities in RLL and RML c/w scarring o/w clear CT Abd 10/20/16 c/w 7 mm nodule  - CT chest 02/13/2017 :  slt enlarged to 9 mm   - PET 04/04/17 :1. These slowly enlarging 9 mm right lower lobe pulmonary nodule along the right hemidiaphragm does not appear hypermetabolic today. Location adjacent to the hemidiaphragm can cause false negatives due to motion artifact related to the  . Stage 3a chronic kidney disease ([place]) 06/17/2020  . Stroke ([place])   . Swelling of limb-Left foot 01/01/2015  . Vitamin B12 deficiency 06/21/2017    Past Surgical History:  Procedure Laterality Date  . adenocarcinoma  2019   Removed  . CAROTID ENDARTERECTOMY  2005   Left carotid by Dr. Oneida Alar  . CAROTID ENDARTERECTOMY  04/25/11   Right Carotid by Dr. Oneida Alar  . COLONOSCOPY  02/23/2017   Colonic polyp status post polypectomy. Pancolonic diverticulosis predominatly in the sigmoid colon. Tubular adenoma  . Septoplasty, nasal w/ submucosal resection  1960  . SQUAMOUS CELL CARCINOMA EXCISION     pt said numerous times  . TONSILLECTOMY    . VIDEO ASSISTED THORACOSCOPY (VATS)/WEDGE RESECTION Right 11/09/2017   Procedure: RIGHT VIDEO ASSISTED THORACOSCOPY WITH Chales Salmon RESECTION;  Surgeon: [name], MD;  Location: [facility];  Service: Thoracic;  Laterality: Right;    Current Medications: Current Meds  Medication Sig  . acetaminophen (TYLENOL) 500 MG tablet Take 1,000 mg by mouth every 6 (six) hours as needed (for pain.).   [name] aspirin EC 81 MG tablet Take 81 mg by mouth daily.  . calcium-vitamin D (OSCAL WITH D) 500-200 MG-UNIT tablet Take 1 tablet by mouth.  . clopidogrel (PLAVIX) 75 MG tablet Take 75 mg by mouth at bedtime.   . colesevelam (WELCHOL) 625 MG tablet Take 1,250 mg by mouth 2 (two) times daily with a meal.  . hydrochlorothiazide (HYDRODIURIL) 25 MG tablet Take 25 mg by mouth daily.   [name] losartan (COZAAR) 50 MG tablet Take 50 mg by mouth daily.   [name] POTASSIUM PO Take 75 mg by mouth daily.  . rosuvastatin (CRESTOR) 40 MG tablet Take 1 tablet (40 mg total) by mouth daily.  . valACYclovir (VALTREX) 1000 MG tablet Take 1,000 mg by mouth 2 (two) times daily as needed (for fever blisters.).   [name] zinc gluconate 50 MG tablet Take 50 mg by mouth daily.     Allergies:   Patient has no known allergies.   Social History   Socioeconomic History  . Marital status: Married    Spouse name: Not on file  . Number of children: 2  . Years of education: Not on file  . Highest education level: Not on file  Occupational History  . Not on file  Tobacco Use  .  Smoking status: Former Smoker    Packs/day: 1.00    Years: 16.00    Pack years: 16.00    Types: Cigarettes    Quit date: 09/26/1972    Years since quitting: 48.2  . Smokeless tobacco: Never Used  Vaping Use  . Vaping Use: Never used  Substance and Sexual Activity  . Alcohol use: Yes    Alcohol/week: 2.0 standard drinks    Types: 2 Glasses of wine per week    Comment:  2-3 Drinks of Scotch per Day  . Drug use: No  . Sexual activity: Not on file  Other Topics Concern  . Not on file  Social History Narrative  . Not on file   Social Determinants of Health   Financial Resource Strain: Not on file  Food Insecurity: Not on file  Transportation Needs: Not on file  Physical Activity: Not on file  Stress: Not on file  Social Connections: Not on file     Family History: The patient's family history includes Cancer in his brother and sister; Cancer (age of onset: 74) in his mother; Deep vein thrombosis in his mother; Diabetes in his father and mother; Heart attack in his father and mother; Heart disease in his mother; Hyperlipidemia in his mother; Hypertension in his mother; Other in his mother and sister; Stroke in his mother; Varicose Veins in his mother. There is no history of Colon cancer or Esophageal cancer. ROS:   Please see the history of present illness.    All 14 point review  of systems negative except as described per history of present illness  EKGs/Labs/Other Studies Reviewed:      Recent Labs: 04/02/2020: NT-Pro BNP 327 08/06/2020: ALT 16; BUN 14; Creatinine 0.90; Hemoglobin 15.9; Platelet Count 156; Potassium 3.6; Sodium 141  Recent Lipid Panel    Component Value Date/Time   CHOL 159 11/21/2018 0836   TRIG 82 11/21/2018 0836   HDL 59 11/21/2018 0836   CHOLHDL 2.7 11/21/2018 0836   LDLCALC 84 11/21/2018 0836    Physical Exam:    VS:  BP 108/60 (BP Location: Right Arm, Patient Position: Sitting, Cuff Size: Normal)   Pulse (!) 58   Ht 5\' 7"  (1.702 m)   Wt 189 lb (85.7 kg)   SpO2 96%   BMI 29.60 kg/m     Wt Readings from Last 3 Encounters:  12/25/20 189 lb (85.7 kg)  10/20/20 190 lb (86.2 kg)  08/06/20 185 lb 1.6 oz (84 kg)     GEN:  Well nourished, well developed in no acute distress HEENT: Normal NECK: No JVD; No carotid bruits LYMPHATICS: No lymphadenopathy CARDIAC: RRR, no murmurs, no rubs, no gallops RESPIRATORY:  Clear to auscultation without rales, wheezing or rhonchi  ABDOMEN: Soft, non-tender, non-distended MUSCULOSKELETAL:  No edema; No deformity  SKIN: Warm and dry LOWER EXTREMITIES: no swelling NEUROLOGIC:  Alert and oriented x 3 PSYCHIATRIC:  Normal affect   ASSESSMENT:    1. Chest pain of uncertain etiology   2. First degree AV block   3. Coronary artery disease involving native coronary artery of native heart without angina pectoris   4. Bilateral carotid artery stenosis   5. PAD (peripheral artery disease) (H[facility])    PLAN:    In order of problems listed above:  1. Constellation of a lot of symptoms with some nonspecific I encouraged him to have a Lexiscan to rule out ischemia I also encouraged him to go back to gym.  Be a little more active.  2. First-degree AV block with second-degree type I AV block with reported bradycardia during the day and however there is lack of correlation between symptoms and his AV block.   We will continue monitoring with understanding that in the future he may require pacemaker. 3. Coronary artery disease again we will do a Lexiscan to rule out activation of the problem. 4. Cardiac artery: That being checked in October last year no critical blockages we will continue monitoring her risk factors modifications   Medication Adjustments/Labs and Tests Ordered: Current medicines are reviewed at length with the patient today.  Concerns regarding medicines are outlined above.  Orders Placed This Encounter  Procedures  . MYOCARDIAL PERFUSION IMAGING   Medication changes: No orders of the defined types were placed in this encounter.   Signed, Park Liter, MD, [facility] 12/25/2020 9:56 AM    [place]

## 2020-12-25 NOTE — Patient Instructions (Signed)
Medication Instructions:  Your physician recommends that you continue on your current medications as directed. Please refer to the Current Medication list given to you today.  *If you need a refill on your cardiac medications before your next appointment, please call your pharmacy*   Lab Work: None If you have labs (blood work) drawn today and your tests are completely normal, you will receive your results only by: [name] MyChart Message (if you have MyChart) OR . A paper copy in the mail If you have any lab test that is abnormal or we need to change your treatment, we will call you to review the results.   Testing/Procedures:   [facility] [address] [place], [place] [address] Phone:  [phone]    Please arrive 15 minutes prior to your appointment time for registration and insurance purposes.  The test will take approximately 3 to 4 hours to complete; you may bring reading material.  If someone comes with you to your appointment, they will need to remain in the main lobby due to limited space in the testing area. **If you are pregnant or breastfeeding, please notify the nuclear lab prior to your appointment**  How to prepare for your Myocardial Perfusion Test: . Do not eat or drink 3 hours prior to your test, except you may have water. . Do not consume products containing caffeine (regular or decaffeinated) 12 hours prior to your test. (ex: coffee, chocolate, sodas, tea). . Do bring a list of your current medications with you.  If not listed below, you may take your medications as normal.  . Do wear comfortable clothes (no dresses or overalls) and walking shoes, tennis shoes preferred (No heels or open toe shoes are allowed). . Do NOT wear cologne, perfume, aftershave, or lotions (deodorant is allowed). . If these instructions are not followed, your test will have to be rescheduled.  Please report to [address] for your test.  If you have  questions or concerns about your appointment, you can call the [facility] at [phone].  If you cannot keep your appointment, please provide 24 hours notification to the Nuclear Lab, to avoid a possible $50 charge to your account.    Follow-Up: At [facility], you and your health needs are our priority.  As part of our continuing mission to provide you with exceptional heart care, we have created designated Provider Care Teams.  These Care Teams include your primary Cardiologist (physician) and Advanced Practice Providers (APPs -  Physician Assistants and Nurse Practitioners) who all work together to provide you with the care you need, when you need it.  We recommend signing up for the patient portal called "MyChart".  Sign up information is provided on this After Visit Summary.  MyChart is used to connect with patients for Virtual Visits (Telemedicine).  Patients are able to view lab/test results, encounter notes, upcoming appointments, etc.  Non-urgent messages can be sent to your provider as well.   To learn more about what you can do with MyChart, go to [email or web address].    Your next appointment:   4 month(s)  The format for your next appointment:   In Person  Provider:   Jenne Campus, MD   Other Instructions   Cardiac Nuclear Scan A cardiac nuclear scan is a test that measures blood flow to the heart when a person is resting and when he or she is exercising. The test looks for problems such as:  Not enough blood reaching a portion of the heart.  The heart muscle not working normally. You may need this test if:  You have heart disease.  You have had abnormal lab results.  You have had heart surgery or a balloon procedure to open up blocked arteries (angioplasty).  You have chest pain.  You have shortness of breath. In this test, a radioactive dye (tracer) is injected into your bloodstream. After the tracer has traveled to  your heart, an imaging device is used to measure how much of the tracer is absorbed by or distributed to various areas of your heart. This procedure is usually done at a hospital and takes 2-4 hours. Tell a health care provider about:  Any allergies you have.  All medicines you are taking, including vitamins, herbs, eye drops, creams, and over-the-counter medicines.  Any problems you or family members have had with anesthetic medicines.  Any blood disorders you have.  Any surgeries you have had.  Any medical conditions you have.  Whether you are pregnant or may be pregnant. What are the risks? Generally, this is a safe procedure. However, problems may occur, including:  Serious chest pain and heart attack. This is only a risk if the stress portion of the test is done.  Rapid heartbeat.  Sensation of warmth in your chest. This usually passes quickly.  Allergic reaction to the tracer. What happens before the procedure?  Ask your health care provider about changing or stopping your regular medicines. This is especially important if you are taking diabetes medicines or blood thinners.  Follow instructions from your health care provider about eating or drinking restrictions.  Remove your jewelry on the day of the procedure. What happens during the procedure?  An IV will be inserted into one of your veins.  Your health care provider will inject a small amount of radioactive tracer through the IV.  You will wait for 20-40 minutes while the tracer travels through your bloodstream.  Your heart activity will be monitored with an electrocardiogram (ECG).  You will lie down on an exam table.  Images of your heart will be taken for about 15-20 minutes.  You may also have a stress test. For this test, one of the following may be done: ? You will exercise on a treadmill or stationary bike. While you exercise, your heart's activity will be monitored with an ECG, and your blood  pressure will be checked. ? You will be given medicines that will increase blood flow to parts of your heart. This is done if you are unable to exercise.  When blood flow to your heart has peaked, a tracer will again be injected through the IV.  After 20-40 minutes, you will get back on the exam table and have more images taken of your heart.  Depending on the type of tracer used, scans may need to be repeated 3-4 hours later.  Your IV line will be removed when the procedure is over. The procedure may vary among health care providers and hospitals. What happens after the procedure?  Unless your health care provider tells you otherwise, you may return to your normal schedule, including diet, activities, and medicines.  Unless your health care provider tells you otherwise, you may increase your fluid intake. This will help to flush the contrast dye from your body. Drink enough fluid to keep your urine pale yellow.  Ask your health care provider, or the department that is doing the test: ? When will my results  be ready? ? How will I get my results? Summary  A cardiac nuclear scan measures the blood flow to the heart when a person is resting and when he or she is exercising.  Tell your health care provider if you are pregnant.  Before the procedure, ask your health care provider about changing or stopping your regular medicines. This is especially important if you are taking diabetes medicines or blood thinners.  After the procedure, unless your health care provider tells you otherwise, increase your fluid intake. This will help flush the contrast dye from your body.  After the procedure, unless your health care provider tells you otherwise, you may return to your normal schedule, including diet, activities, and medicines. This information is not intended to replace advice given to you by your health care provider. Make sure you discuss any questions you have with your health care  provider. Document Revised: 02/26/2018 Document Reviewed: 02/26/2018 Elsevier Patient Education  [place].

## 2020-12-28 ENCOUNTER — Telehealth (HOSPITAL_COMMUNITY): Payer: Self-pay | Admitting: *Deleted

## 2020-12-28 NOTE — Telephone Encounter (Signed)
Left message on voicemail per DPR in reference to upcoming appointment scheduled on 12/31/20 at 11:30 with detailed instructions given per Myocardial Perfusion Study Information Sheet for the test. LM to arrive 15 minutes early, and that it is imperative to arrive on time for appointment to keep from having the test rescheduled. If you need to cancel or reschedule your appointment, please call the office within 24 hours of your appointment. Failure to do so may result in a cancellation of your appointment, and a $50 no show fee. Phone number given for call back for any questions.

## 2020-12-31 ENCOUNTER — Other Ambulatory Visit: Payer: Self-pay

## 2020-12-31 ENCOUNTER — Ambulatory Visit (INDEPENDENT_AMBULATORY_CARE_PROVIDER_SITE_OTHER): Payer: Medicare Other

## 2020-12-31 DIAGNOSIS — R079 Chest pain, unspecified: Secondary | ICD-10-CM | POA: Diagnosis not present

## 2020-12-31 LAB — MYOCARDIAL PERFUSION IMAGING
LV dias vol: 72 mL (ref 62–150)
LV sys vol: 17 mL
Peak HR: 63 {beats}/min
Rest HR: 48 {beats}/min
SDS: 0
SRS: 7
SSS: 7
TID: 0.97

## 2020-12-31 MED ORDER — REGADENOSON 0.4 MG/5ML IV SOLN
0.4000 mg | Freq: Once | INTRAVENOUS | Status: AC
  Administered 2020-12-31: 0.4 mg via INTRAVENOUS

## 2020-12-31 MED ORDER — TECHNETIUM TC 99M TETROFOSMIN IV KIT
10.9000 | PACK | Freq: Once | INTRAVENOUS | Status: AC | PRN
  Administered 2020-12-31: 10.9 via INTRAVENOUS

## 2020-12-31 MED ORDER — TECHNETIUM TC 99M TETROFOSMIN IV KIT
30.9000 | PACK | Freq: Once | INTRAVENOUS | Status: AC | PRN
  Administered 2020-12-31: 30.9 via INTRAVENOUS

## 2021-01-15 ENCOUNTER — Other Ambulatory Visit: Payer: Self-pay

## 2021-01-19 ENCOUNTER — Encounter: Payer: Self-pay | Admitting: Cardiology

## 2021-01-19 ENCOUNTER — Ambulatory Visit (INDEPENDENT_AMBULATORY_CARE_PROVIDER_SITE_OTHER): Payer: Medicare Other | Admitting: Cardiology

## 2021-01-19 ENCOUNTER — Other Ambulatory Visit: Payer: Self-pay

## 2021-01-19 VITALS — Ht 68.0 in | Wt 189.0 lb

## 2021-01-19 DIAGNOSIS — R9439 Abnormal result of other cardiovascular function study: Secondary | ICD-10-CM

## 2021-01-19 NOTE — Patient Instructions (Signed)
Medication Instructions:  Your physician has recommended you make the following change in your medication:    STOP: Hydrochlorothiazide  STOP: Potassium   *If you need a refill on your cardiac medications before your next appointment, please call your pharmacy*   Lab Work: Your physician recommends that you return for lab work today: bmp ,cbc   If you have labs (blood work) drawn today and your tests are completely normal, you will receive your results only by: [name] MyChart Message (if you have MyChart) OR . A paper copy in the mail If you have any lab test that is abnormal or we need to change your treatment, we will call you to review the results.   Testing/Procedures:    [place] [address] Dept: [phone] Loc: [phone]  NOLON YELLIN  01/19/2021  You are scheduled for a Cardiac Catheterization on Monday, May 16 with Dr. Lauree Chandler.  1. Please arrive at the [facility] (Main Entrance A) at [facility]: [address] [place], [place] [address] at 5:30 AM (This time is two hours before your procedure to ensure your preparation). Free valet parking service is available.   Special note: Every effort is made to have your procedure done on time. Please understand that emergencies sometimes delay scheduled procedures.  2. Diet: Do not eat solid foods after midnight.  The patient may have clear liquids until 5am upon the day of the procedure.  3. Labs: You will need to have blood drawn today. 4. Medication instructions in preparation for your procedure:   Contrast Allergy: No    On the morning of your procedure, take your Aspirin and any morning medicines NOT listed above.  You may use sips of water.  5. Plan for one night stay--bring personal belongings. 6. Bring a current list of your medications and current insurance cards. 7. You MUST have  a responsible person to drive you home. 8. Someone MUST be with you the first 24 hours after you arrive home or your discharge will be delayed. 9. Please wear clothes that are easy to get on and off and wear slip-on shoes.  Thank you for allowing [place] to care for you!   --  Invasive Cardiovascular services    Follow-Up: At [facility], you and your health needs are our priority.  As part of our continuing mission to provide you with exceptional heart care, we have created designated Provider Care Teams.  These Care Teams include your primary Cardiologist (physician) and Advanced Practice Providers (APPs -  Physician Assistants and Nurse Practitioners) who all work together to provide you with the care you need, when you need it.  We recommend signing up for the patient portal called "MyChart".  Sign up information is provided on this After Visit Summary.  MyChart is used to connect with patients for Virtual Visits (Telemedicine).  Patients are able to view lab/test results, encounter notes, upcoming appointments, etc.  Non-urgent messages can be sent to your provider as well.   To learn more about what you can do with MyChart, go to [email or web address].    Your next appointment:   1 month(s)  The format for your next appointment:   In Person  Provider:   Jenne Campus, MD   Other Instructions   Coronary Angiogram With Stent Coronary angiogram with stent placement is a procedure to widen or open a narrow blood vessel of the  heart (coronary artery). Arteries may become blocked by cholesterol buildup (plaques) in the lining of the artery wall. When a coronary artery becomes partially blocked, blood flow to that area decreases. This may lead to chest pain or a heart attack (myocardial infarction). A stent is a small piece of metal that looks like mesh or spring. Stent placement may be done as treatment after a heart attack, or to prevent a heart attack if a blocked artery  is found by a coronary angiogram. Let your health care provider know about:  Any allergies you have, including allergies to medicines or contrast dye.  All medicines you are taking, including vitamins, herbs, eye drops, creams, and over-the-counter medicines.  Any problems you or family members have had with anesthetic medicines.  Any blood disorders you have.  Any surgeries you have had.  Any medical conditions you have, including kidney problems or kidney failure.  Whether you are pregnant or may be pregnant.  Whether you are breastfeeding. What are the risks? Generally, this is a safe procedure. However, serious problems may occur, including:  Damage to nearby structures or organs, such as the heart, blood vessels, or kidneys.  A return of blockage.  Bleeding, infection, or bruising at the insertion site.  A collection of blood under the skin (hematoma) at the insertion site.  A blood clot in another part of the body.  Allergic reaction to medicines or dyes.  Bleeding into the abdomen (retroperitoneal bleeding).  Stroke (rare).  Heart attack (rare). What happens before the procedure? Staying hydrated Follow instructions from your health care provider about hydration, which may include:  Up to 2 hours before the procedure - you may continue to drink clear liquids, such as water, clear fruit juice, black coffee, and plain tea.   Eating and drinking restrictions Follow instructions from your health care provider about eating and drinking, which may include:  8 hours before the procedure - stop eating heavy meals or foods, such as meat, fried foods, or fatty foods.  6 hours before the procedure - stop eating light meals or foods, such as toast or cereal.  2 hours before the procedure - stop drinking clear liquids. Medicines Ask your health care provider about:  Changing or stopping your regular medicines. This is especially important if you are taking diabetes  medicines or blood thinners.  Taking medicines such as aspirin and ibuprofen. These medicines can thin your blood. Do not take these medicines unless your health care provider tells you to take them. ? Generally, aspirin is recommended before a thin tube, called a catheter, is passed through a blood vessel and inserted into the heart (cardiac catheterization).  Taking over-the-counter medicines, vitamins, herbs, and supplements. General instructions  Do not use any products that contain nicotine or tobacco for at least 4 weeks before the procedure. These products include cigarettes, e-cigarettes, and chewing tobacco. If you need help quitting, ask your health care provider.  Plan to have someone take you home from the hospital or clinic.  If you will be going home right after the procedure, plan to have someone with you for 24 hours.  You may have tests and imaging procedures.  Ask your health care provider: ? How your insertion site will be marked. Ask which artery will be used for the procedure. ? What steps will be taken to help prevent infection. These may include:  Removing hair at the insertion site.  Washing skin with a germ-killing soap.  Taking antibiotic medicine. What happens  during the procedure?  An IV will be inserted into one of your veins.  Electrodes may be placed on your chest to monitor your heart rate during the procedure.  You will be given one or more of the following: ? A medicine to help you relax (sedative). ? A medicine to numb the area (local anesthetic) for catheter insertion.  A small incision will be made for catheter insertion.  The catheter will be inserted into an artery using a guide wire. The location may be in your groin, your wrist, or the fold of your arm (near your elbow).  An X-ray procedure (fluoroscopy) will be used to help guide the catheter to the opening of the heart arteries.  A dye will be injected into the catheter. X-rays will  be taken. The dye helps to show where any narrowing or blockages are located in the arteries.  Tell your health care provider if you have chest pain or trouble breathing.  A tiny wire will be guided to the blocked spot, and a balloon will be inflated to make the artery wider.  The stent will be expanded to crush the plaques into the wall of the vessel. The stent will hold the area open and improve the blood flow. Most stents have a drug coating to reduce the risk of the stent narrowing over time.  The artery may be made wider using a drill, laser, or other tools that remove plaques.  The catheter will be removed when the blood flow improves. The stent will stay where it was placed, and the lining of the artery will grow over it.  A bandage (dressing) will be placed on the insertion site. Pressure will be applied to stop bleeding.  The IV will be removed. This procedure may vary among health care providers and hospitals.   What happens after the procedure?  Your blood pressure, heart rate, breathing rate, and blood oxygen level will be monitored until you leave the hospital or clinic.  If the procedure is done through the leg, you will lie flat in bed for a few hours or for as long as told by your health care provider. You will be instructed not to bend or cross your legs.  The insertion site and the pulse in your foot or wrist will be checked often.  You may have more blood tests, X-rays, and a test that records the electrical activity of your heart (electrocardiogram, or ECG).  Do not drive for 24 hours if you were given a sedative during your procedure. Summary  Coronary angiogram with stent placement is a procedure to widen or open a narrowed coronary artery. This is done to treat heart problems.  Before the procedure, let your health care provider know about all the medical conditions and surgeries you have or have had.  This is a safe procedure. However, some problems may occur,  including damage to nearby structures or organs, bleeding, blood clots, or allergies.  Follow your health care provider's instructions about eating, drinking, medicines, and other lifestyle changes, such as quitting tobacco use before the procedure. This information is not intended to replace advice given to you by your health care provider. Make sure you discuss any questions you have with your health care provider. Document Revised: 04/03/2019 Document Reviewed: 04/03/2019 Elsevier Patient Education  [place].

## 2021-01-19 NOTE — Progress Notes (Signed)
Cardiology Office Note:    Date:  01/19/2021   ID:  Louis Barton, DOB 08-May-1941, MRN [id number]  PCP:  Raina Mina., MD  Cardiologist:  Jenne Campus, MD    Referring MD: Raina Mina., MD   Chief Complaint  Patient presents with  . Cough  . dizziness    Want to discuss blood work done in 03/2020    History of Present Illness:    Louis Barton is a 80 y.o. 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  He has been coming to me for years however within the last few months he has been experiencing very peculiar symptoms that he have really difficult time to pinpoint what the problem is.  He did wear monitor which showed first-degree AV block as well as second-degree type I AV block but he had no symptoms during those episodes.  He complained of being dizzy especially when he gets up and try to walk them minute later he will get dizzy to the point that he is just sat down.  He check his blood pressure usually blood pressures is good.  Eventually end up moving towards ischemia evaluation and surprisingly stress test show ischemia involving inferior wall.  I brought him today to my office to talk about options for the situation.  He denies have any typical chest pain tightness squeezing pressure in the chest but profound weakness fatigue tiredness and just not feeling himself is the problem.  There was a time he used to drink some alcohol but stopped doing this because of episode of dizziness improved somewhat but still some issues.  He does have some difficulty walking straight sometimes.   Past Medical History:  Diagnosis Date  . Aftercare following surgery of the circulatory system, [place] 12/19/2013  . Arthritis    DDD- aging   . 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 Atherosclerosis 11/16/2015  . Atypical chest  pain 06/11/2015  . Benign paroxysmal positional vertigo 08/08/2016  . Bradycardia 03/18/2015  . CAD (coronary artery disease)   . 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 Carotid stenosis   . Chronic kidney disease    cysts on both kidneys, seeing [name] in [place], [facility]   . Coronary arteriosclerosis 11/16/2015   Formatting of this note might be different from the original. On imaging;  . Cramps of left lower extremity-Calf  > right calf 01/01/2015  . 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 signficant DDD present with vacuum effect L4-5 and L5-S1 flat discs and anterior spurring noted  . Dizziness 05/06/2020  . Dyslipidemia 03/18/2015  . Dyspnea on exertion 04/02/2020  . Ear itch 05/14/2018  . Edema 11/16/2015  . Edema of both lower extremities due to peripheral venous insufficiency 02/03/2020  . First degree AV block 10/20/2020  . History of hiatal hernia    seen on last scan- as slight   . History of thrombocytopenia   . History of TIAs   . Hyperlipidemia   . Hypertension   . Irritable bowel syndrome 11/17/2016   rec trial of citrucel/ diet 11/16/2016 >>>     . Local edema 02/07/2018  . Malaise and fatigue 12/08/2020  . 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 [place]. And Dr. [name]  . 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 Occlusion and stenosis of carotid artery without mention of cerebral infarction 12/01/2011  . PAD (peripheral artery disease) ([place])   . Prediabetes 12/25/2018  . Preop cardiovascular exam 04/14/2011  . Renal cysts, acquired, bilateral 10/24/2016   Formatting of this note might be different from the original. Renal parapelvic cysts 10/06/16 on CT , right kidney midpole 1.2 cm, right kidney anterior pole 1.1 cm, and to the left kidney lower pole anterior position 3.5cm all felt to be benign  . Right bundle branch block 10/20/2020  .  Right lower lobe pulmonary nodule 11/09/2017  . Shingles    internal- obstruction of bowels & gallbladder  . Solitary pulmonary nodule 11/16/2016   CT ABd 03/24/09 linerar densities in RLL and RML c/w scarring o/w clear CT Abd 10/20/16 c/w 7 mm nodule  - CT chest 02/13/2017 :  slt enlarged to 9 mm   - PET 04/04/17 :1. These slowly enlarging 9 mm right lower lobe pulmonary nodule along the right hemidiaphragm does not appear hypermetabolic today. Location adjacent to the hemidiaphragm can cause false negatives due to motion artifact related to the  . Stage 3a chronic kidney disease ([place]) 06/17/2020  . Stroke ([place])   . Swelling of limb-Left foot 01/01/2015  . Vitamin B12 deficiency 06/21/2017    Past Surgical History:  Procedure Laterality Date  . adenocarcinoma  2019   Removed  . CAROTID ENDARTERECTOMY  2005   Left carotid by Dr. Oneida Alar  . CAROTID ENDARTERECTOMY  04/25/11   Right Carotid by Dr. Oneida Alar  . COLONOSCOPY  02/23/2017   Colonic polyp status post polypectomy. Pancolonic diverticulosis predominatly in the sigmoid colon. Tubular adenoma  . Septoplasty, nasal w/ submucosal resection  1960  . SQUAMOUS CELL CARCINOMA EXCISION     pt said numerous times  . TONSILLECTOMY    . VIDEO ASSISTED THORACOSCOPY (VATS)/WEDGE RESECTION Right 11/09/2017   Procedure: RIGHT VIDEO ASSISTED THORACOSCOPY WITH Chales Salmon RESECTION;  Surgeon: [name], MD;  Location: [facility];  Service: Thoracic;  Laterality: Right;    Current Medications: Current Meds  Medication Sig  . acetaminophen (TYLENOL) 500 MG tablet Take 1,000 mg by mouth every 6 (six) hours as needed for mild pain or moderate pain (for pain.).  [name] aspirin EC 81 MG tablet Take 81 mg by mouth daily.  . clopidogrel (PLAVIX) 75 MG tablet Take 75 mg by mouth at bedtime.   . colesevelam (WELCHOL) 625 MG tablet Take 1,250 mg by mouth 2 (two) times daily with a meal.  . hydrochlorothiazide (HYDRODIURIL) 25 MG tablet Take 25 mg by mouth daily.   [name]  losartan (COZAAR) 50 MG tablet Take 50 mg by mouth daily.  . potassium chloride (KLOR-CON) 10 MEQ tablet Take 1 tablet by mouth daily.  . rosuvastatin (CRESTOR) 40 MG tablet Take 1 tablet (40 mg total) by mouth daily.  . valACYclovir (VALTREX) 1000 MG tablet Take 1,000 mg by mouth 2 (two) times daily as needed (for fever blisters.).   [name] zinc gluconate 50 MG tablet Take 50 mg by mouth every other day.  . [DISCONTINUED] POTASSIUM PO Take 75 mg by mouth daily.     Allergies:   Patient has no known allergies.   Social History   Socioeconomic History  . Marital status: Married    Spouse name: Not on file  . Number of children: 2  . Years of education:  Not on file  . Highest education level: Not on file  Occupational History  . Not on file  Tobacco Use  . Smoking status: Former Smoker    Packs/day: 1.00    Years: 16.00    Pack years: 16.00    Types: Cigarettes    Quit date: 09/26/1972    Years since quitting: 48.3  . Smokeless tobacco: Never Used  Vaping Use  . Vaping Use: Never used  Substance and Sexual Activity  . Alcohol use: Yes    Alcohol/week: 2.0 standard drinks    Types: 2 Glasses of wine per week    Comment:  2-3 Drinks of Scotch per Day  . Drug use: No  . Sexual activity: Not on file  Other Topics Concern  . Not on file  Social History Narrative  . Not on file   Social Determinants of Health   Financial Resource Strain: Not on file  Food Insecurity: Not on file  Transportation Needs: Not on file  Physical Activity: Not on file  Stress: Not on file  Social Connections: Not on file     Family History: The patient's family history includes Cancer in his brother and sister; Cancer (age of onset: 100) in his mother; Deep vein thrombosis in his mother; Diabetes in his father and mother; Heart attack in his father and mother; Heart disease in his mother; Hyperlipidemia in his mother; Hypertension in his mother; Other in his mother and sister; Stroke in his mother;  Varicose Veins in his mother. There is no history of Colon cancer or Esophageal cancer. ROS:   Please see the history of present illness.    All 14 point review of systems negative except as described per history of present illness  EKGs/Labs/Other Studies Reviewed:      Recent Labs: 04/02/2020: NT-Pro BNP 327 08/06/2020: ALT 16; BUN 14; Creatinine 0.90; Hemoglobin 15.9; Platelet Count 156; Potassium 3.6; Sodium 141  Recent Lipid Panel    Component Value Date/Time   CHOL 159 11/21/2018 0836   TRIG 82 11/21/2018 0836   HDL 59 11/21/2018 0836   CHOLHDL 2.7 11/21/2018 0836   LDLCALC 84 11/21/2018 0836    Physical Exam:    VS:  Ht 5\' 8"  (1.727 m)   Wt 189 lb (85.7 kg)   SpO2 95%   BMI 28.74 kg/m     Wt Readings from Last 3 Encounters:  01/19/21 189 lb (85.7 kg)  12/31/20 189 lb (85.7 kg)  12/25/20 189 lb (85.7 kg)     GEN:  Well nourished, well developed in no acute distress HEENT: Normal NECK: No JVD; No carotid bruits LYMPHATICS: No lymphadenopathy CARDIAC: RRR, no murmurs, no rubs, no gallops RESPIRATORY:  Clear to auscultation without rales, wheezing or rhonchi  ABDOMEN: Soft, non-tender, non-distended MUSCULOSKELETAL:  No edema; No deformity  SKIN: Warm and dry LOWER EXTREMITIES: no swelling NEUROLOGIC:  Alert and oriented x 3 PSYCHIATRIC:  Normal affect   ASSESSMENT:    No diagnosis found. PLAN:    In order of problems listed above:  1. Abnormal stress test showing ischemia involving inferior wall.  After long discussion with him and terms of options for the situation because of constellation of very atypical symptoms I think we need to clarify to make sure he does not have any significant coronary disease especially since problem indicated by stress test involving right coronary artery his symptomatology can be atypical.  We did talk about cardiac catheterization is one of the leading option and he  accepted offered to have it done.  Procedure was explained to  him including all risk benefits as well as alternatives.  He is willing to proceed.  We will schedule him to have it done. 2. Peripheral vascular disease in form of carotid arterial disease, carotic ultrasound done last time in October of last year showing no critical lesions up to 39% of both sides. 3. First-degree as well as second-degree AV block type I noted on the monitor.  Average heart rate was 59 minimum heart rate 24 in the middle of the night, he was asymptomatic during the time.  He did have also episode of ventricular tachycardia.  Again we will rule out ischemia of potential reason for his symptomatology, all AV blocking agent has been withdrawn, 4. Dyslipidemia, he is on high intensity statin form of Crestor 40 mg which I will continue.  His last cholesterol done in December 28, 2020 show LDL of 77 HDL 50.  We will continue present management. 5. Dizziness which look orthostatic.  I will discontinue his hydrochlorothiazide as well as potassium, will recheck his potassium week later.   Medication Adjustments/Labs and Tests Ordered: Current medicines are reviewed at length with the patient today.  Concerns regarding medicines are outlined above.  No orders of the defined types were placed in this encounter.  Medication changes: No orders of the defined types were placed in this encounter.   Signed, Park Liter, MD, [facility] 01/19/2021 11:42 AM    [place]

## 2021-01-19 NOTE — H&P (View-Only) (Signed)
Cardiology Office Note:    Date:  01/19/2021   ID:  [name], DOB 1940/12/02, MRN [id number]  PCP:  Raina Mina., MD  Cardiologist:  Jenne Campus, MD    Referring MD: Raina Mina., MD   Chief Complaint  Patient presents with  . Cough  . dizziness    Want to discuss blood work done in 03/2020    History of Present Illness:    Louis Barton is a 80 y.o. 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  He has been coming to me for years however within the last few months he has been experiencing very peculiar symptoms that he have really difficult time to pinpoint what the problem is.  He did wear monitor which showed first-degree AV block as well as second-degree type I AV block but he had no symptoms during those episodes.  He complained of being dizzy especially when he gets up and try to walk them minute later he will get dizzy to the point that he is just sat down.  He check his blood pressure usually blood pressures is good.  Eventually end up moving towards ischemia evaluation and surprisingly stress test show ischemia involving inferior wall.  I brought him today to my office to talk about options for the situation.  He denies have any typical chest pain tightness squeezing pressure in the chest but profound weakness fatigue tiredness and just not feeling himself is the problem.  There was a time he used to drink some alcohol but stopped doing this because of episode of dizziness improved somewhat but still some issues.  He does have some difficulty walking straight sometimes.   Past Medical History:  Diagnosis Date  . Aftercare following surgery of the circulatory system, [place] 12/19/2013  . Arthritis    DDD- aging   . 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 Atherosclerosis 11/16/2015  . Atypical chest  pain 06/11/2015  . Benign paroxysmal positional vertigo 08/08/2016  . Bradycardia 03/18/2015  . CAD (coronary artery disease)   . 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 Carotid stenosis   . Chronic kidney disease    cysts on both kidneys, seeing [name] in [place], [facility]   . Coronary arteriosclerosis 11/16/2015   Formatting of this note might be different from the original. On imaging;  . Cramps of left lower extremity-Calf  > right calf 01/01/2015  . 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 signficant DDD present with vacuum effect L4-5 and L5-S1 flat discs and anterior spurring noted  . Dizziness 05/06/2020  . Dyslipidemia 03/18/2015  . Dyspnea on exertion 04/02/2020  . Ear itch 05/14/2018  . Edema 11/16/2015  . Edema of both lower extremities due to peripheral venous insufficiency 02/03/2020  . First degree AV block 10/20/2020  . History of hiatal hernia    seen on last scan- as slight   . History of thrombocytopenia   . History of TIAs   . Hyperlipidemia   . Hypertension   . Irritable bowel syndrome 11/17/2016   rec trial of citrucel/ diet 11/16/2016 >>>     . Local edema 02/07/2018  . Malaise and fatigue 12/08/2020  . 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 [place]. And Dr. [name]  . 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 Occlusion and stenosis of carotid artery without mention of cerebral infarction 12/01/2011  . PAD (peripheral artery disease) ([place])   . Prediabetes 12/25/2018  . Preop cardiovascular exam 04/14/2011  . Renal cysts, acquired, bilateral 10/24/2016   Formatting of this note might be different from the original. Renal parapelvic cysts 10/06/16 on CT , right kidney midpole 1.2 cm, right kidney anterior pole 1.1 cm, and to the left kidney lower pole anterior position 3.5cm all felt to be benign  . Right bundle branch block 10/20/2020  .  Right lower lobe pulmonary nodule 11/09/2017  . Shingles    internal- obstruction of bowels & gallbladder  . Solitary pulmonary nodule 11/16/2016   CT ABd 03/24/09 linerar densities in RLL and RML c/w scarring o/w clear CT Abd 10/20/16 c/w 7 mm nodule  - CT chest 02/13/2017 :  slt enlarged to 9 mm   - PET 04/04/17 :1. These slowly enlarging 9 mm right lower lobe pulmonary nodule along the right hemidiaphragm does not appear hypermetabolic today. Location adjacent to the hemidiaphragm can cause false negatives due to motion artifact related to the  . Stage 3a chronic kidney disease ([place]) 06/17/2020  . Stroke ([place])   . Swelling of limb-Left foot 01/01/2015  . Vitamin B12 deficiency 06/21/2017    Past Surgical History:  Procedure Laterality Date  . adenocarcinoma  2019   Removed  . CAROTID ENDARTERECTOMY  2005   Left carotid by Dr. Oneida Alar  . CAROTID ENDARTERECTOMY  04/25/11   Right Carotid by Dr. Oneida Alar  . COLONOSCOPY  02/23/2017   Colonic polyp status post polypectomy. Pancolonic diverticulosis predominatly in the sigmoid colon. Tubular adenoma  . Septoplasty, nasal w/ submucosal resection  1960  . SQUAMOUS CELL CARCINOMA EXCISION     pt said numerous times  . TONSILLECTOMY    . VIDEO ASSISTED THORACOSCOPY (VATS)/WEDGE RESECTION Right 11/09/2017   Procedure: RIGHT VIDEO ASSISTED THORACOSCOPY WITH Chales Salmon RESECTION;  Surgeon: [name], MD;  Location: [facility];  Service: Thoracic;  Laterality: Right;    Current Medications: Current Meds  Medication Sig  . acetaminophen (TYLENOL) 500 MG tablet Take 1,000 mg by mouth every 6 (six) hours as needed for mild pain or moderate pain (for pain.).  [name] aspirin EC 81 MG tablet Take 81 mg by mouth daily.  . clopidogrel (PLAVIX) 75 MG tablet Take 75 mg by mouth at bedtime.   . colesevelam (WELCHOL) 625 MG tablet Take 1,250 mg by mouth 2 (two) times daily with a meal.  . hydrochlorothiazide (HYDRODIURIL) 25 MG tablet Take 25 mg by mouth daily.   [name]  losartan (COZAAR) 50 MG tablet Take 50 mg by mouth daily.  . potassium chloride (KLOR-CON) 10 MEQ tablet Take 1 tablet by mouth daily.  . rosuvastatin (CRESTOR) 40 MG tablet Take 1 tablet (40 mg total) by mouth daily.  . valACYclovir (VALTREX) 1000 MG tablet Take 1,000 mg by mouth 2 (two) times daily as needed (for fever blisters.).   [name] zinc gluconate 50 MG tablet Take 50 mg by mouth every other day.  . [DISCONTINUED] POTASSIUM PO Take 75 mg by mouth daily.     Allergies:   Patient has no known allergies.   Social History   Socioeconomic History  . Marital status: Married    Spouse name: Not on file  . Number of children: 2  . Years of education:  Not on file  . Highest education level: Not on file  Occupational History  . Not on file  Tobacco Use  . Smoking status: Former Smoker    Packs/day: 1.00    Years: 16.00    Pack years: 16.00    Types: Cigarettes    Quit date: 09/26/1972    Years since quitting: 48.3  . Smokeless tobacco: Never Used  Vaping Use  . Vaping Use: Never used  Substance and Sexual Activity  . Alcohol use: Yes    Alcohol/week: 2.0 standard drinks    Types: 2 Glasses of wine per week    Comment:  2-3 Drinks of Scotch per Day  . Drug use: No  . Sexual activity: Not on file  Other Topics Concern  . Not on file  Social History Narrative  . Not on file   Social Determinants of Health   Financial Resource Strain: Not on file  Food Insecurity: Not on file  Transportation Needs: Not on file  Physical Activity: Not on file  Stress: Not on file  Social Connections: Not on file     Family History: The patient's family history includes Cancer in his brother and sister; Cancer (age of onset: 71) in his mother; Deep vein thrombosis in his mother; Diabetes in his father and mother; Heart attack in his father and mother; Heart disease in his mother; Hyperlipidemia in his mother; Hypertension in his mother; Other in his mother and sister; Stroke in his mother;  Varicose Veins in his mother. There is no history of Colon cancer or Esophageal cancer. ROS:   Please see the history of present illness.    All 14 point review of systems negative except as described per history of present illness  EKGs/Labs/Other Studies Reviewed:      Recent Labs: 04/02/2020: NT-Pro BNP 327 08/06/2020: ALT 16; BUN 14; Creatinine 0.90; Hemoglobin 15.9; Platelet Count 156; Potassium 3.6; Sodium 141  Recent Lipid Panel    Component Value Date/Time   CHOL 159 11/21/2018 0836   TRIG 82 11/21/2018 0836   HDL 59 11/21/2018 0836   CHOLHDL 2.7 11/21/2018 0836   LDLCALC 84 11/21/2018 0836    Physical Exam:    VS:  Ht 5\' 8"  (1.727 m)   Wt 189 lb (85.7 kg)   SpO2 95%   BMI 28.74 kg/m     Wt Readings from Last 3 Encounters:  01/19/21 189 lb (85.7 kg)  12/31/20 189 lb (85.7 kg)  12/25/20 189 lb (85.7 kg)     GEN:  Well nourished, well developed in no acute distress HEENT: Normal NECK: No JVD; No carotid bruits LYMPHATICS: No lymphadenopathy CARDIAC: RRR, no murmurs, no rubs, no gallops RESPIRATORY:  Clear to auscultation without rales, wheezing or rhonchi  ABDOMEN: Soft, non-tender, non-distended MUSCULOSKELETAL:  No edema; No deformity  SKIN: Warm and dry LOWER EXTREMITIES: no swelling NEUROLOGIC:  Alert and oriented x 3 PSYCHIATRIC:  Normal affect   ASSESSMENT:    No diagnosis found. PLAN:    In order of problems listed above:  1. Abnormal stress test showing ischemia involving inferior wall.  After long discussion with him and terms of options for the situation because of constellation of very atypical symptoms I think we need to clarify to make sure he does not have any significant coronary disease especially since problem indicated by stress test involving right coronary artery his symptomatology can be atypical.  We did talk about cardiac catheterization is one of the leading option and he  accepted offered to have it done.  Procedure was explained to  him including all risk benefits as well as alternatives.  He is willing to proceed.  We will schedule him to have it done. 2. Peripheral vascular disease in form of carotid arterial disease, carotic ultrasound done last time in October of last year showing no critical lesions up to 39% of both sides. 3. First-degree as well as second-degree AV block type I noted on the monitor.  Average heart rate was 59 minimum heart rate 24 in the middle of the night, he was asymptomatic during the time.  He did have also episode of ventricular tachycardia.  Again we will rule out ischemia of potential reason for his symptomatology, all AV blocking agent has been withdrawn, 4. Dyslipidemia, he is on high intensity statin form of Crestor 40 mg which I will continue.  His last cholesterol done in December 28, 2020 show LDL of 77 HDL 50.  We will continue present management. 5. Dizziness which look orthostatic.  I will discontinue his hydrochlorothiazide as well as potassium, will recheck his potassium week later.   Medication Adjustments/Labs and Tests Ordered: Current medicines are reviewed at length with the patient today.  Concerns regarding medicines are outlined above.  No orders of the defined types were placed in this encounter.  Medication changes: No orders of the defined types were placed in this encounter.   Signed, Louis Liter, MD, [facility] 01/19/2021 11:42 AM    [place]

## 2021-01-20 LAB — CBC
Hematocrit: 47.1 % (ref 37.5–51.0)
Hemoglobin: 15.7 g/dL (ref 13.0–17.7)
MCH: 31 pg (ref 26.6–33.0)
MCHC: 33.3 g/dL (ref 31.5–35.7)
MCV: 93 fL (ref 79–97)
Platelets: 149 10*3/uL — ABNORMAL LOW (ref 150–450)
RBC: 5.06 x10E6/uL (ref 4.14–5.80)
RDW: 12.8 % (ref 11.6–15.4)
WBC: 6.8 10*3/uL (ref 3.4–10.8)

## 2021-01-20 LAB — BASIC METABOLIC PANEL
BUN/Creatinine Ratio: 19 (ref 10–24)
BUN: 16 mg/dL (ref 8–27)
CO2: 25 mmol/L (ref 20–29)
Calcium: 9.1 mg/dL (ref 8.6–10.2)
Chloride: 102 mmol/L (ref 96–106)
Creatinine, Ser: 0.86 mg/dL (ref 0.76–1.27)
Glucose: 65 mg/dL (ref 65–99)
Potassium: 3.4 mmol/L — ABNORMAL LOW (ref 3.5–5.2)
Sodium: 143 mmol/L (ref 134–144)
eGFR: 86 mL/min/{1.73_m2} (ref >59)

## 2021-01-21 ENCOUNTER — Telehealth: Payer: Self-pay | Admitting: Emergency Medicine

## 2021-01-21 DIAGNOSIS — Z79899 Other long term (current) drug therapy: Secondary | ICD-10-CM

## 2021-01-21 MED ORDER — POTASSIUM CHLORIDE ER 10 MEQ PO TBCR: 10.0000 meq | EXTENDED_RELEASE_TABLET | Freq: Every day | ORAL | 1 refills | Status: DC

## 2021-01-21 NOTE — Telephone Encounter (Signed)
-----   Message from Park Liter, MD sent at 01/21/2021  3:02 PM EDT ----- Labs are good except for potassium being slightly low.  Please add 10 mg of potassium every day to medical regimen, Chem-7 need to be done within the next week or 10 days

## 2021-01-21 NOTE — Telephone Encounter (Signed)
Called patient informed of results. He will start potassium 10 meq daily and  Repeat labs in 1 week. No further questions.

## 2021-01-25 ENCOUNTER — Telehealth: Payer: Self-pay | Admitting: Hematology and Oncology

## 2021-01-25 NOTE — Telephone Encounter (Signed)
Patient rescheduled from 5/11 w/Dr Hinton Rao to 5/20 Lab Appt, 5/23 Follow Up w/Melissa

## 2021-01-27 ENCOUNTER — Telehealth: Payer: Self-pay | Admitting: Cardiology

## 2021-01-27 MED ORDER — POTASSIUM CHLORIDE ER 10 MEQ PO TBCR: 20.0000 meq | EXTENDED_RELEASE_TABLET | Freq: Every day | ORAL | 3 refills | Status: DC

## 2021-01-27 MED ORDER — FUROSEMIDE 20 MG PO TABS: 20.0000 mg | ORAL_TABLET | Freq: Every day | ORAL | 3 refills | Status: DC

## 2021-01-27 NOTE — Telephone Encounter (Signed)
Patient was returning call 

## 2021-01-27 NOTE — Telephone Encounter (Signed)
Left message on patients voicemail to please return our call.   

## 2021-01-27 NOTE — Addendum Note (Signed)
Addended by: Resa Miner I on: 01/27/2021 04:33 PM   Modules accepted: Orders

## 2021-01-27 NOTE — Telephone Encounter (Signed)
Patient returning call.

## 2021-01-27 NOTE — Telephone Encounter (Signed)
Pt c/o swelling: STAT is pt has developed SOB within 24 hours  1) How much weight have you gained and in what time span? 188lbs to 192.2lbs in 5 days  2) If swelling, where is the swelling located? Bilateral foot and ankle  3) Are you currently taking a fluid pill? Pt was taking off fluid pill on 01/20/21  4) Are you currently SOB? Yes for 3 days  5) Do you have a log of your daily weights (if so, list)? No  6) Have you gained 3 pounds in a day or 5 pounds in a week?  188lbs to 192.2lbs in 5 days  7) Have you traveled recently? NO  Pt is scheduled for a heart cath 02/08/21 Pt would like to be put back on his fluid pill but he wants to be put on 10mg  Pt said he is unable to put a shoe on The phone number I have listed is the number for TODAY ONLY, pt does not want this phone number added to his chart.

## 2021-01-27 NOTE — Telephone Encounter (Signed)
Spoke to the patient just now and let him know Dr. Lucendia Herrlich recommendations. He verbalizes understanding and thanks me for the call back.

## 2021-01-29 LAB — BASIC METABOLIC PANEL
BUN/Creatinine Ratio: 10 (ref 10–24)
BUN: 9 mg/dL (ref 8–27)
CO2: 26 mmol/L (ref 20–29)
Calcium: 8.8 mg/dL (ref 8.6–10.2)
Chloride: 105 mmol/L (ref 96–106)
Creatinine, Ser: 0.92 mg/dL (ref 0.76–1.27)
Glucose: 106 mg/dL — ABNORMAL HIGH (ref 65–99)
Potassium: 4.3 mmol/L (ref 3.5–5.2)
Sodium: 142 mmol/L (ref 134–144)
eGFR: 83 mL/min/{1.73_m2} (ref >59)

## 2021-02-03 ENCOUNTER — Ambulatory Visit: Payer: Medicare Other | Admitting: Oncology

## 2021-02-04 ENCOUNTER — Ambulatory Visit: Payer: Medicare Other | Admitting: Cardiology

## 2021-02-04 ENCOUNTER — Telehealth: Payer: Self-pay | Admitting: *Deleted

## 2021-02-04 NOTE — Telephone Encounter (Addendum)
Pt contacted pre-catheterization scheduled at [facility] for: Monday Feb 08, 2021 7:30 AM Verified arrival time and place: [facility]) at: 5:30 AM   No solid food after midnight prior to cath, clear liquids until 5 AM day of procedure.  Hold: Lasix/KCl-AM of procedure  Except hold medications AM meds can be  taken pre-cath with sips of water including: ASA 81 mg Plavix 75 mg  Confirmed patient has responsible adult to drive home post procedure and be with patient first 24 hours after arriving home: yes  You are allowed ONE visitor in the waiting room during the time you are at the hospital for your procedure. Both you and your visitor must wear a mask once you enter the hospital.    Reviewed procedure/mask/visitor instructions with patient.

## 2021-02-05 ENCOUNTER — Other Ambulatory Visit (HOSPITAL_COMMUNITY)
Admission: RE | Admit: 2021-02-05 | Discharge: 2021-02-05 | Disposition: A | Discharge status: Home / Self Care | Payer: Medicare Other | Source: Ambulatory Visit | Attending: Cardiovascular Disease | Admitting: Cardiovascular Disease

## 2021-02-05 DIAGNOSIS — Z01812 Encounter for preprocedural laboratory examination: Secondary | ICD-10-CM | POA: Diagnosis present

## 2021-02-05 DIAGNOSIS — Z20822 Contact with and (suspected) exposure to covid-19: Secondary | ICD-10-CM | POA: Diagnosis not present

## 2021-02-06 LAB — SARS CORONAVIRUS 2 (TAT 6-24 HRS): SARS Coronavirus 2: NEGATIVE

## 2021-02-08 ENCOUNTER — Encounter (HOSPITAL_COMMUNITY)
Admission: RE | Disposition: A | Discharge status: Home / Self Care | Payer: Self-pay | Source: Ambulatory Visit | Attending: Cardiovascular Disease

## 2021-02-08 ENCOUNTER — Ambulatory Visit (HOSPITAL_COMMUNITY)
Admission: RE | Admit: 2021-02-08 | Discharge: 2021-02-08 | Disposition: A | Discharge status: Home / Self Care | Payer: Medicare Other | Source: Ambulatory Visit | Attending: Cardiovascular Disease | Admitting: Cardiovascular Disease

## 2021-02-08 ENCOUNTER — Other Ambulatory Visit: Payer: Self-pay

## 2021-02-08 ENCOUNTER — Encounter (HOSPITAL_COMMUNITY): Payer: Self-pay | Admitting: Cardiovascular Disease

## 2021-02-08 DIAGNOSIS — R42 Dizziness and giddiness: Secondary | ICD-10-CM | POA: Insufficient documentation

## 2021-02-08 DIAGNOSIS — E785 Hyperlipidemia, unspecified: Secondary | ICD-10-CM | POA: Insufficient documentation

## 2021-02-08 DIAGNOSIS — I739 Peripheral vascular disease, unspecified: Secondary | ICD-10-CM | POA: Diagnosis not present

## 2021-02-08 DIAGNOSIS — I441 Atrioventricular block, second degree: Secondary | ICD-10-CM | POA: Diagnosis not present

## 2021-02-08 DIAGNOSIS — R9439 Abnormal result of other cardiovascular function study: Secondary | ICD-10-CM | POA: Diagnosis present

## 2021-02-08 DIAGNOSIS — Z7902 Long term (current) use of antithrombotics/antiplatelets: Secondary | ICD-10-CM | POA: Insufficient documentation

## 2021-02-08 DIAGNOSIS — Z79899 Other long term (current) drug therapy: Secondary | ICD-10-CM | POA: Insufficient documentation

## 2021-02-08 DIAGNOSIS — Z7982 Long term (current) use of aspirin: Secondary | ICD-10-CM | POA: Insufficient documentation

## 2021-02-08 DIAGNOSIS — I2510 Atherosclerotic heart disease of native coronary artery without angina pectoris: Secondary | ICD-10-CM | POA: Insufficient documentation

## 2021-02-08 DIAGNOSIS — Z87891 Personal history of nicotine dependence: Secondary | ICD-10-CM | POA: Diagnosis not present

## 2021-02-08 HISTORY — PX: LEFT HEART CATH AND CORONARY ANGIOGRAPHY: CATH118249

## 2021-02-08 SURGERY — LEFT HEART CATH AND CORONARY ANGIOGRAPHY
Anesthesia: LOCAL

## 2021-02-08 MED ORDER — SODIUM CHLORIDE 0.9% FLUSH: 3.0000 mL | INTRAVENOUS | Status: DC | PRN

## 2021-02-08 MED ORDER — FENTANYL CITRATE (PF) 100 MCG/2ML IJ SOLN
INTRAMUSCULAR | Status: DC | PRN
  Administered 2021-02-08: 25 ug via INTRAVENOUS

## 2021-02-08 MED ORDER — SODIUM CHLORIDE 0.9 % IV SOLN: 250.0000 mL | INTRAVENOUS | Status: DC | PRN

## 2021-02-08 MED ORDER — HEPARIN (PORCINE) IN NACL 1000-0.9 UT/500ML-% IV SOLN
INTRAVENOUS | Status: DC | PRN
  Administered 2021-02-08 (×2): 500 mL

## 2021-02-08 MED ORDER — LIDOCAINE HCL (PF) 1 % IJ SOLN
INTRAMUSCULAR | Status: DC | PRN
  Administered 2021-02-08: 2 mL

## 2021-02-08 MED ORDER — HEPARIN (PORCINE) IN NACL 1000-0.9 UT/500ML-% IV SOLN
INTRAVENOUS | Status: AC
  Filled 2021-02-08: qty 500

## 2021-02-08 MED ORDER — ONDANSETRON HCL 4 MG/2ML IJ SOLN: 4.0000 mg | Freq: Four times a day (QID) | INTRAMUSCULAR | Status: DC | PRN

## 2021-02-08 MED ORDER — FENTANYL CITRATE (PF) 100 MCG/2ML IJ SOLN
INTRAMUSCULAR | Status: AC
  Filled 2021-02-08: qty 2

## 2021-02-08 MED ORDER — MIDAZOLAM HCL 2 MG/2ML IJ SOLN
INTRAMUSCULAR | Status: DC | PRN
  Administered 2021-02-08: 1 mg via INTRAVENOUS

## 2021-02-08 MED ORDER — HYDRALAZINE HCL 20 MG/ML IJ SOLN: 10.0000 mg | INTRAMUSCULAR | Status: DC | PRN

## 2021-02-08 MED ORDER — PHENYLEPHRINE 40 MCG/ML (10ML) SYRINGE FOR IV PUSH (FOR BLOOD PRESSURE SUPPORT)
PREFILLED_SYRINGE | INTRAVENOUS | Status: AC
  Filled 2021-02-08: qty 20

## 2021-02-08 MED ORDER — MIDAZOLAM HCL 2 MG/2ML IJ SOLN
INTRAMUSCULAR | Status: AC
  Filled 2021-02-08: qty 2

## 2021-02-08 MED ORDER — SODIUM CHLORIDE 0.9 % WEIGHT BASED INFUSION: 1.0000 mL/kg/h | INTRAVENOUS | Status: DC

## 2021-02-08 MED ORDER — SODIUM CHLORIDE 0.9% FLUSH: 3.0000 mL | Freq: Two times a day (BID) | INTRAVENOUS | Status: DC

## 2021-02-08 MED ORDER — ACETAMINOPHEN 325 MG PO TABS: 650.0000 mg | ORAL_TABLET | ORAL | Status: DC | PRN

## 2021-02-08 MED ORDER — LIDOCAINE HCL (PF) 1 % IJ SOLN
INTRAMUSCULAR | Status: AC
  Filled 2021-02-08: qty 30

## 2021-02-08 MED ORDER — CLOPIDOGREL BISULFATE 75 MG PO TABS: 75.0000 mg | ORAL_TABLET | ORAL | Status: AC

## 2021-02-08 MED ORDER — LABETALOL HCL 5 MG/ML IV SOLN: 10.0000 mg | INTRAVENOUS | Status: DC | PRN

## 2021-02-08 MED ORDER — ASPIRIN 81 MG PO CHEW: 81.0000 mg | CHEWABLE_TABLET | ORAL | Status: AC

## 2021-02-08 MED ORDER — SODIUM CHLORIDE 0.9 % WEIGHT BASED INFUSION
3.0000 mL/kg/h | INTRAVENOUS | Status: AC
  Administered 2021-02-08: 3 mL/kg/h via INTRAVENOUS

## 2021-02-08 MED ORDER — VERAPAMIL HCL 2.5 MG/ML IV SOLN
INTRAVENOUS | Status: DC | PRN
  Administered 2021-02-08: 10 mL via INTRA_ARTERIAL

## 2021-02-08 MED ORDER — HEPARIN SODIUM (PORCINE) 1000 UNIT/ML IJ SOLN
INTRAMUSCULAR | Status: AC
  Filled 2021-02-08: qty 1

## 2021-02-08 MED ORDER — HEPARIN SODIUM (PORCINE) 1000 UNIT/ML IJ SOLN
INTRAMUSCULAR | Status: DC | PRN
  Administered 2021-02-08: 4000 [IU] via INTRAVENOUS

## 2021-02-08 SURGICAL SUPPLY — 9 items
CATH 5FR JL3.5 JR4 ANG PIG MP (CATHETERS) ×2 IMPLANT
DEVICE RAD COMP TR BAND LRG (VASCULAR PRODUCTS) ×4 IMPLANT
GLIDESHEATH SLEND SS 6F .021 (SHEATH) ×2 IMPLANT
GUIDEWIRE INQWIRE 1.5J.035X260 (WIRE) ×1 IMPLANT
INQWIRE 1.5J .035X260CM (WIRE) ×2
KIT HEART LEFT (KITS) ×2 IMPLANT
PACK CARDIAC CATHETERIZATION (CUSTOM PROCEDURE TRAY) ×2 IMPLANT
TRANSDUCER W/STOPCOCK (MISCELLANEOUS) ×2 IMPLANT
TUBING CIL FLEX 10 FLL-RA (TUBING) ×2 IMPLANT

## 2021-02-08 NOTE — Interval H&P Note (Signed)
History and Physical Interval Note:  02/08/2021 7:19 AM  Louis Barton  has presented today for surgery, with the diagnosis of abnormal stress test.  The various methods of treatment have been discussed with the patient and family. After consideration of risks, benefits and other options for treatment, the patient has consented to  Procedure(s): LEFT HEART CATH AND CORONARY ANGIOGRAPHY (N/A) as a surgical intervention.  The patient's history has been reviewed, patient examined, no change in status, stable for surgery.  I have reviewed the patient's chart and labs.  Questions were answered to the patient's satisfaction.    Cath Lab Visit (complete for each Cath Lab visit)  Clinical Evaluation Leading to the Procedure:   ACS: No.  Non-ACS:    Anginal Classification: No Symptoms  Anti-ischemic medical therapy: No Therapy  Non-Invasive Test Results: Intermediate-risk stress test findings: cardiac mortality 1-3%/year  Prior CABG: No previous CABG        Lauree Chandler

## 2021-02-08 NOTE — Discharge Instructions (Signed)
Radial Site Care  This sheet gives you information about how to care for yourself after your procedure. Your health care provider may also give you more specific instructions. If you have problems or questions, contact your health care provider. What can I expect after the procedure? After the procedure, it is common to have:  Bruising and tenderness at the catheter insertion area. Follow these instructions at home: Medicines  Take over-the-counter and prescription medicines only as told by your health care provider. Insertion site care 1. Follow instructions from your health care provider about how to take care of your insertion site. Make sure you: ? Wash your hands with soap and water before you remove your bandage (dressing). If soap and water are not available, use hand sanitizer. ? May remove dressing in 24 hours. 2. Check your insertion site every day for signs of infection. Check for: ? Redness, swelling, or pain. ? Fluid or blood. ? Pus or a bad smell. ? Warmth. 3. Do no take baths, swim, or use a hot tub for 5 days. 4. You may shower 24-48 hours after the procedure. ? Remove the dressing and gently wash the site with plain soap and water. ? Pat the area dry with a clean towel. ? Do not rub the site. That could cause bleeding. 5. Do not apply powder or lotion to the site. Activity  1. For 24 hours after the procedure, or as directed by your health care provider: ? Do not flex or bend the affected arm. ? Do not push or pull heavy objects with the affected arm. ? Do not drive yourself home from the hospital or clinic. You may drive 24 hours after the procedure. ? Do not operate machinery or power tools. ? KEEP ARM ELEVATED THE REMAINDER OF THE DAY. 2. Do not push, pull or lift anything that is heavier than 10 lb for 5 days. 3. Ask your health care provider when it is okay to: ? Return to work or school. ? Resume usual physical activities or sports. ? Resume sexual  activity. General instructions  If the catheter site starts to bleed, raise your arm and put firm pressure on the site. If the bleeding does not stop, get help right away. This is a medical emergency.  DRINK PLENTY OF FLUIDS FOR THE NEXT 2-3 DAYS.  No alcohol consumption for 24 hours after receiving sedation.  If you went home on the same day as your procedure, a responsible adult should be with you for the first 24 hours after you arrive home.  Keep all follow-up visits as told by your health care provider. This is important. Contact a health care provider if:  You have a fever.  You have redness, swelling, or yellow drainage around your insertion site. Get help right away if:  You have unusual pain at the radial site.  The catheter insertion area swells very fast.  The insertion area is bleeding, and the bleeding does not stop when you hold steady pressure on the area.  Your arm or hand becomes pale, cool, tingly, or numb. These symptoms may represent a serious problem that is an emergency. Do not wait to see if the symptoms will go away. Get medical help right away. Call your local emergency services (911 in the [place] Do not drive yourself to the hospital. Summary  After the procedure, it is common to have bruising and tenderness at the site.  Follow instructions from your health care provider about how to take care   of your radial site wound. Check the wound every day for signs of infection.  This information is not intended to replace advice given to you by your health care provider. Make sure you discuss any questions you have with your health care provider. Document Revised: 10/18/2017 Document Reviewed: 10/18/2017 Elsevier Patient Education  2020 [employer] 

## 2021-02-08 NOTE — Progress Notes (Signed)
Discharge instructions reviewed with pt and his daughter both voice understanding.

## 2021-02-08 NOTE — Progress Notes (Signed)
Ambulated to the bathroom to void tol well  

## 2021-02-12 ENCOUNTER — Other Ambulatory Visit: Payer: Self-pay | Admitting: Hematology and Oncology

## 2021-02-12 ENCOUNTER — Inpatient Hospital Stay: Payer: Medicare Other | Attending: Hematology and Oncology

## 2021-02-12 ENCOUNTER — Other Ambulatory Visit: Payer: Self-pay

## 2021-02-12 ENCOUNTER — Encounter: Payer: Self-pay | Admitting: Hematology and Oncology

## 2021-02-12 DIAGNOSIS — C3431 Malignant neoplasm of lower lobe, right bronchus or lung: Secondary | ICD-10-CM | POA: Diagnosis present

## 2021-02-12 DIAGNOSIS — Z86010 Personal history of colonic polyps: Secondary | ICD-10-CM | POA: Insufficient documentation

## 2021-02-12 DIAGNOSIS — C3490 Malignant neoplasm of unspecified part of unspecified bronchus or lung: Secondary | ICD-10-CM

## 2021-02-12 DIAGNOSIS — N281 Cyst of kidney, acquired: Secondary | ICD-10-CM | POA: Insufficient documentation

## 2021-02-12 DIAGNOSIS — Z79899 Other long term (current) drug therapy: Secondary | ICD-10-CM | POA: Insufficient documentation

## 2021-02-12 DIAGNOSIS — Z87891 Personal history of nicotine dependence: Secondary | ICD-10-CM | POA: Insufficient documentation

## 2021-02-12 LAB — BASIC METABOLIC PANEL
BUN: 13 (ref 4–21)
CO2: 26 — AB (ref 13–22)
Chloride: 108 (ref 99–108)
Creatinine: 1 (ref 0.6–1.3)
Glucose: 108
Potassium: 4.1 (ref 3.4–5.3)
Sodium: 139 (ref 137–147)

## 2021-02-12 LAB — HEPATIC FUNCTION PANEL
ALT: 15 (ref 10–40)
AST: 28 (ref 14–40)
Alkaline Phosphatase: 49 (ref 25–125)
Bilirubin, Total: 1

## 2021-02-12 LAB — CBC AND DIFFERENTIAL
HCT: 49 (ref 41–53)
Hemoglobin: 15.9 (ref 13.5–17.5)
Neutrophils Absolute: 4.02
Platelets: 155 (ref 150–399)
WBC: 6.7

## 2021-02-12 LAB — COMPREHENSIVE METABOLIC PANEL
Albumin: 3.9 (ref 3.5–5.0)
Calcium: 8.6 — AB (ref 8.7–10.7)

## 2021-02-12 LAB — CBC: RBC: 5.12 — AB (ref 3.87–5.11)

## 2021-02-13 LAB — CEA: CEA: 11 ng/mL — ABNORMAL HIGH (ref 0.0–4.7)

## 2021-02-15 ENCOUNTER — Other Ambulatory Visit: Payer: Self-pay

## 2021-02-15 ENCOUNTER — Inpatient Hospital Stay (HOSPITAL_BASED_OUTPATIENT_CLINIC_OR_DEPARTMENT_OTHER): Payer: Medicare Other | Admitting: Hematology and Oncology

## 2021-02-15 ENCOUNTER — Telehealth: Payer: Self-pay | Admitting: Hematology and Oncology

## 2021-02-15 ENCOUNTER — Encounter: Payer: Self-pay | Admitting: Hematology and Oncology

## 2021-02-15 VITALS — BP 134/72 | HR 72 | Temp 97.7°F | Resp 16 | Ht 68.0 in | Wt 188.8 lb

## 2021-02-15 DIAGNOSIS — C3490 Malignant neoplasm of unspecified part of unspecified bronchus or lung: Secondary | ICD-10-CM

## 2021-02-15 NOTE — Progress Notes (Signed)
[facility]  [address] 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 Here for 6 month evaluation.  Current Treatment:  Observation   HISTORY OF PRESENT ILLNESS:  NIK GORRELL is a 80 y.o. 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  We began seeing him in March 2019 for follow-up.  He was found to have an enlarging right lower lobe nodule in January 2019 and had a wedge resection by VATS procedure by Dr. Modesto Charon in February.  Pathology revealed an invasive moderately differentiated adenocarcinoma measuring 1 cm with 14 - nodes.  His CEA was elevated at 9 postoperatively.He is on observation only.  He is also being followed by the urologist for multiple renal cysts.  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  Colonoscopy in May 2018 revealed colon polyps with pathology revealing tubular adenomas.  Upper endoscopy and colonoscopy in 2019 did not reveal any malignancy, so we feel we have ruled out a gastrointestinal source as the cause of the increased CEA.  The CEA increased to 10.5 in November, but then was back down to 8.9 in February.  Repeat CT imaging of the chest, abdomen and pelvis did not reveal any evidence of malignancy.  Previously seen right pleural effusion had decreased, there was a similar appearance of the suspected rounded atelectasis at the right lung base, which was also decreased.  CEA at that time was 9.9.  There was stable bilateral renal cysts.  He had surgery of the left lateral and posterior neck for additional squamous cell carcinomas of the skin.  Repeat CT chest, abdomen and pelvis in October revealed stable rounded presumed atelectasis in the right lower lobe along the resection site with stable  scarring, stable volume loss in the right lower lobe and right middle lobe, as well as stable small adjacent right pleural effusion with adjacent pleural thickening without changes suggestive of active malignancy.  The bilateral renal cysts were stable.  CT imaging from May 11 revealed stable appearance of right pleural thickening, trace pleural effusion and adjacent density along the wedge resection site favoring rounded atelectasis and scarring in the right lower lobe.  No compelling findings of active malignancy.  Labs from the same day revealed an unremarkable CBC and CMP.  CEA was 10.5, previously 9.7, but this tends to fluctuate up and down in this range.  He has some residual effects from COVID-19 that he had in November 2020.   INTERVAL HISTORY:  Shemuel is seen in the  clinic for evaluation of his stage Ia (T1 a N0 M0) non-small cell lung cancer. He most recently underwent heart cath for shortness of breath with negative findings. He had COVID several months ago and then received the vaccines. He has had difficulties with shortness of breath and fatigue ever since. He has increased nasal drainage and upper airway congestion. He denies fever, chills, nausea or vomiting. He denies issue with bowel or bladder. He denies chest pain. CBC and CMP is unremarkable. CEA is 11.0 increased from 10.5 at last visit.   REVIEW OF SYSTEMS:  Review of Systems  Constitutional: Negative for appetite change, chills, diaphoresis, fatigue, fever and unexpected weight change.  HENT:   Negative for hearing loss,  lump/mass, mouth sores, nosebleeds, sore throat, tinnitus, trouble swallowing and voice change.   Eyes: Negative for eye problems and icterus.  Respiratory: Positive for cough and shortness of breath. Negative for chest tightness, hemoptysis and wheezing.   Cardiovascular: Negative for chest pain, leg swelling and palpitations.  Gastrointestinal: Negative for abdominal distention, abdominal pain, blood in  stool, constipation, diarrhea, nausea, rectal pain and vomiting.  Endocrine: Negative for hot flashes.  Genitourinary: Negative for bladder incontinence, difficulty urinating, dyspareunia, dysuria, frequency, hematuria and nocturia.   Musculoskeletal: Negative for arthralgias, back pain, flank pain, gait problem, myalgias, neck pain and neck stiffness.  Skin: Negative for itching, rash and wound.  Neurological: Negative for dizziness, extremity weakness, gait problem, headaches, light-headedness, numbness, seizures and speech difficulty.  Hematological: Negative for adenopathy. Does not bruise/bleed easily.  Psychiatric/Behavioral: Negative for confusion, decreased concentration, depression, sleep disturbance and suicidal ideas. The patient is not nervous/anxious.      VITALS:  Blood pressure 134/72, pulse 72, temperature 97.7 F (36.5 C), temperature source Oral, resp. rate 16, height 5\' 8"  (1.727 m), weight 188 lb 12.8 oz (85.6 kg), SpO2 95 %.  Wt Readings from Last 3 Encounters:  02/15/21 188 lb 12.8 oz (85.6 kg)  02/08/21 186 lb (84.4 kg)  01/19/21 189 lb (85.7 kg)    Body mass index is 28.71 kg/m.  Performance status (ECOG): 1 - Symptomatic but completely ambulatory  PHYSICAL EXAM:  Physical Exam Constitutional:      General: He is not in acute distress.    Appearance: Normal appearance. He is normal weight. He is not ill-appearing, toxic-appearing or diaphoretic.  HENT:     Head: Normocephalic and atraumatic.     Nose: Nose normal. No congestion or rhinorrhea.     Mouth/Throat:     Mouth: Mucous membranes are moist.     Pharynx: Oropharynx is clear. No oropharyngeal exudate or posterior oropharyngeal erythema.  Eyes:     General: No scleral icterus.       Right eye: No discharge.        Left eye: No discharge.     Extraocular Movements: Extraocular movements intact.     Conjunctiva/sclera: Conjunctivae normal.     Pupils: Pupils are equal, round, and reactive to light.   Neck:     Vascular: No carotid bruit.  Cardiovascular:     Rate and Rhythm: Normal rate and regular rhythm.     Heart sounds: No murmur heard. No friction rub. No gallop.   Pulmonary:     Effort: Pulmonary effort is normal. No respiratory distress.     Breath sounds: Normal breath sounds. No stridor. No wheezing, rhonchi or rales.  Chest:     Chest wall: No tenderness.  Abdominal:     General: Abdomen is flat. Bowel sounds are normal. There is no distension.     Palpations: There is no mass.     Tenderness: There is no abdominal tenderness. There is no right CVA tenderness, left CVA tenderness, guarding or rebound.     Hernia: No hernia is present.  Musculoskeletal:        General: No swelling, tenderness, deformity or signs of injury. Normal range of motion.     Cervical back: Normal range of motion and neck supple. No rigidity or tenderness.     Right lower leg: No edema.     Left lower leg: No edema.  Lymphadenopathy:     Cervical: No cervical adenopathy.  Skin:    General: Skin is warm  and dry.     Capillary Refill: Capillary refill takes less than 2 seconds.     Coloration: Skin is not jaundiced or pale.     Findings: No bruising, erythema, lesion or rash.  Neurological:     General: No focal deficit present.     Mental Status: He is alert and oriented to person, place, and time. Mental status is at baseline.     Cranial Nerves: No cranial nerve deficit.     Sensory: No sensory deficit.     Motor: No weakness.     Coordination: Coordination normal.     Gait: Gait normal.     Deep Tendon Reflexes: Reflexes normal.  Psychiatric:        Mood and Affect: Mood normal.        Behavior: Behavior normal.        Thought Content: Thought content normal.        Judgment: Judgment normal.    Lymph nodes:   There is no cervical, clavicular, axillary or lymphadenopathy.   LABS:   CBC Latest Ref Rng & Units 02/12/2021 01/19/2021 08/06/2020  WBC - 6.7 6.8 6.3  Hemoglobin 13.5  - 17.5 15.9 15.7 15.9  Hematocrit 41 - 53 49 47.1 47.1  Platelets 150 - 399 155 149(L) 156   CMP Latest Ref Rng & Units 02/12/2021 01/28/2021 01/19/2021  Glucose 65 - 99 mg/dL - 106(H) 65  BUN 4 - 21 13 9 16   Creatinine 0.6 - 1.3 1.0 0.92 0.86  Sodium 137 - 147 139 142 143  Potassium 3.4 - 5.3 4.1 4.3 3.4(L)  Chloride 99 - 108 108 105 102  CO2 13 - 22 26(A) 26 25  Calcium 8.7 - 10.7 8.6(A) 8.8 9.1  Total Protein 6.5 - 8.1 g/dL - - -  Total Bilirubin 0.3 - 1.2 mg/dL - - -  Alkaline Phos 25 - 125 49 - -  AST 14 - 40 28 - -  ALT 10 - 40 15 - -     Lab Results  Component Value Date   CEA1 11.0 (H) 02/12/2021   /  CEA  Date Value Ref Range Status  02/12/2021 11.0 (H) 0.0 - 4.7 ng/mL Final    Comment:    (NOTE)                             Nonsmokers          <3.9                             Smokers             <5.6 [employer] Electrochemiluminescence Immunoassay (ECLIA) Values obtained with different assay methods or kits cannot be used interchangeably.  Results cannot be interpreted as absolute evidence of the presence or absence of malignant disease. Performed At: [facility] [place], [place] [id number] Rush Farmer MD ZR:[id number]    No results found for: PSA1 No results found for: CAN199 No results found for: CAN125  No results found for: TOTALPROTELP, ALBUMINELP, A1GS, A2GS, BETS, BETA2SER, GAMS, MSPIKE, SPEI No results found for: TIBC, FERRITIN, IRONPCTSAT No results found for: LDH  STUDIES:  CARDIAC CATHETERIZATION  Result Date: 02/08/2021  Mid Cx to Dist Cx lesion is 20% stenosed.  Prox LAD to Mid LAD lesion is 20% stenosed.  1. Mild non-obstructive CAD 2. Mild elevation LVEDP Recommendations:  Medical management of mild CAD      HISTORY:   Past Medical History:  Diagnosis Date  . Aftercare following surgery of the circulatory system, [place] 12/19/2013  . Arthritis    DDD- aging   . Ascending aortic aneurysm ([place]) 05/15/2020   . Atherosclerosis 11/16/2015  . Atypical chest pain 06/11/2015  . Benign paroxysmal positional vertigo 08/08/2016  . Bradycardia 03/18/2015  . CAD (coronary artery disease)   . 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 Carotid stenosis   . Chronic kidney disease    cysts on both kidneys, seeing [name] in [place], [facility]   . Coronary arteriosclerosis 11/16/2015   Formatting of this note might be different from the original. On imaging;  . Cramps of left lower extremity-Calf  > right calf 01/01/2015  . 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 signficant DDD present with vacuum effect L4-5 and L5-S1 flat discs and anterior spurring noted  . Dizziness 05/06/2020  . Dyslipidemia 03/18/2015  . Dyspnea on exertion 04/02/2020  . Ear itch 05/14/2018  . Edema 11/16/2015  . Edema of both lower extremities due to peripheral venous insufficiency 02/03/2020  . First degree AV block 10/20/2020  . History of hiatal hernia    seen on last scan- as slight   . History of thrombocytopenia   . History of TIAs   . Hyperlipidemia   . Hypertension   . Irritable bowel syndrome 11/17/2016   rec trial of citrucel/ diet 11/16/2016 >>>     . Local edema 02/07/2018  . Malaise and fatigue 12/08/2020  . Malignant neoplasm of left lung ([place]) 12/08/2017   Stage 1A. [place]. And Dr. [name]  . 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 Occlusion and stenosis of carotid artery without mention of cerebral infarction 12/01/2011  . PAD (peripheral artery disease) ([place])   . Prediabetes 12/25/2018  . Preop cardiovascular exam 04/14/2011  . Renal cysts, acquired, bilateral 10/24/2016   Formatting of this note might be different from the original. Renal parapelvic cysts 10/06/16 on CT , right kidney midpole 1.2 cm, right kidney anterior pole 1.1 cm, and to the left kidney lower pole anterior position 3.5cm all felt to be  benign  . Right bundle branch block 10/20/2020  . Right lower lobe pulmonary nodule 11/09/2017  . Shingles    internal- obstruction of bowels & gallbladder  . Solitary pulmonary nodule 11/16/2016   CT ABd 03/24/09 linerar densities in RLL and RML c/w scarring o/w clear CT Abd 10/20/16 c/w 7 mm nodule  - CT chest 02/13/2017 :  slt enlarged to 9 mm   - PET 04/04/17 :1. These slowly enlarging 9 mm right lower lobe pulmonary nodule along the right hemidiaphragm does not appear hypermetabolic today. Location adjacent to the hemidiaphragm can cause false negatives due to motion artifact related to the  . Stage 3a chronic kidney disease ([place]) 06/17/2020  . Stroke ([place])   . Swelling of limb-Left foot 01/01/2015  . Vitamin B12 deficiency 06/21/2017    Past Surgical History:  Procedure Laterality Date  . adenocarcinoma  2019   Removed  . CAROTID ENDARTERECTOMY  2005   Left carotid by Dr. Oneida Alar  . CAROTID ENDARTERECTOMY  04/25/11   Right Carotid by Dr. Oneida Alar  . COLONOSCOPY  02/23/2017   Colonic polyp status post polypectomy. Pancolonic diverticulosis predominatly  in the sigmoid colon. Tubular adenoma  . LEFT HEART CATH AND CORONARY ANGIOGRAPHY N/A 02/08/2021   Procedure: LEFT HEART CATH AND CORONARY ANGIOGRAPHY;  Surgeon: Burnell Blanks, MD;  Location: [facility];  Service: Cardiovascular;  Laterality: N/A;  . Septoplasty, nasal w/ submucosal resection  1960  . SQUAMOUS CELL CARCINOMA EXCISION     pt said numerous times  . TONSILLECTOMY    . VIDEO ASSISTED THORACOSCOPY (VATS)/WEDGE RESECTION Right 11/09/2017   Procedure: RIGHT VIDEO ASSISTED THORACOSCOPY WITH Chales Salmon RESECTION;  Surgeon: [name], MD;  Location: [facility];  Service: Thoracic;  Laterality: Right;    Family History  Problem Relation Age of Onset  . Cancer Mother 67       Breast  . Stroke Mother   . Diabetes Mother   . Hyperlipidemia Mother   . Other Mother        varicose veins  . Heart attack Mother   .  Heart disease Mother        Left ankle swelling  . Hypertension Mother   . Deep vein thrombosis Mother   . Varicose Veins Mother   . Diabetes Father   . Heart attack Father   . Other Sister        Tumor in [place]  . Cancer Sister        Tumor   Lung  and  Brain  . Cancer Brother        BCC-SCC-Merkle Cell  . Colon cancer Neg Hx   . Esophageal cancer Neg Hx     Social History:  reports that he quit smoking about 48 years ago. His smoking use included cigarettes. He has a 16.00 pack-year smoking history. He has never used smokeless tobacco. He reports current alcohol use of about 2.0 standard drinks of alcohol per week. He reports that he does not use drugs.The patient is alone  today.  Allergies: No Known Allergies  Current Medications: Current Outpatient Medications  Medication Sig Dispense Refill  . acetaminophen (TYLENOL) 500 MG tablet Take 1,000 mg by mouth every 6 (six) hours as needed for mild pain or moderate pain (for pain.).    [name] aspirin EC 81 MG tablet Take 81 mg by mouth daily.    . clopidogrel (PLAVIX) 75 MG tablet Take 75 mg by mouth at bedtime.     . colesevelam (WELCHOL) 625 MG tablet Take 1,250 mg by mouth 2 (two) times daily with a meal.    . furosemide (LASIX) 20 MG tablet Take 1 tablet (20 mg total) by mouth daily. 90 tablet 3  . losartan (COZAAR) 50 MG tablet Take 50 mg by mouth daily.    Vladimir Faster Glycol-Propyl Glycol (SYSTANE ULTRA OP) Place 1 drop into both eyes daily.    . potassium chloride (KLOR-CON) 10 MEQ tablet Take 2 tablets (20 mEq total) by mouth daily. 180 tablet 3  . rosuvastatin (CRESTOR) 40 MG tablet Take 1 tablet (40 mg total) by mouth daily. 30 tablet 6  . valACYclovir (VALTREX) 1000 MG tablet Take 1,000 mg by mouth 2 (two) times daily as needed (for fever blisters.).     [name] zinc gluconate 50 MG tablet Take 50 mg by mouth 2 (two) times a week.     No current facility-administered medications for this visit.     ASSESSMENT & PLAN:    Assessment:  YARON GRASSE is a 80 y.o. male with stage Ia non-small cell lung cancer treated with surgery.  He remains without evidence of disease.  He has multiple renal cysts being monitored by Dr. Tamala Julian, his urologist with [facility].  He also has multiple skin cancers for which he had a recent squamous cell carcinoma resected from the upper right anterior chest.  History of COVID-19 from November 2020 with mild residual brain fog, bilateral lower extremity weakness, and shortness of breath with fatigue. He most recently underwent heart cath for shortness of breath with benign findings.  Multiple colon polyps. He continues to work full time, though does have decreased energy.   Plan: We will repeat CT imaging as he continues to have shortness of breath and CEA continues to rise. He will return to clinic in 6 months for repeat evaluation unless CT imaging requires sooner examination.  The patient understands the plans discussed today and is in agreement with them.    The patient knows to contact our office if his develops concerns prior to his next appointment.    Melodye Ped, NP

## 2021-02-15 NOTE — Telephone Encounter (Signed)
Per 5/23 los next appt scheduled and given to patient

## 2021-02-24 DIAGNOSIS — Z8673 Personal history of transient ischemic attack (TIA), and cerebral infarction without residual deficits: Secondary | ICD-10-CM | POA: Insufficient documentation

## 2021-02-24 DIAGNOSIS — I6529 Occlusion and stenosis of unspecified carotid artery: Secondary | ICD-10-CM | POA: Insufficient documentation

## 2021-02-24 DIAGNOSIS — Z8719 Personal history of other diseases of the digestive system: Secondary | ICD-10-CM | POA: Insufficient documentation

## 2021-02-24 DIAGNOSIS — I10 Essential (primary) hypertension: Secondary | ICD-10-CM | POA: Insufficient documentation

## 2021-02-24 DIAGNOSIS — M1990 Unspecified osteoarthritis, unspecified site: Secondary | ICD-10-CM | POA: Insufficient documentation

## 2021-02-24 DIAGNOSIS — I2510 Atherosclerotic heart disease of native coronary artery without angina pectoris: Secondary | ICD-10-CM | POA: Insufficient documentation

## 2021-03-02 ENCOUNTER — Encounter: Payer: Self-pay | Admitting: Cardiology

## 2021-03-02 ENCOUNTER — Other Ambulatory Visit: Payer: Self-pay

## 2021-03-02 ENCOUNTER — Ambulatory Visit (INDEPENDENT_AMBULATORY_CARE_PROVIDER_SITE_OTHER): Payer: Medicare Other | Admitting: Cardiology

## 2021-03-02 VITALS — BP 126/74 | HR 74 | Ht 68.0 in | Wt 188.0 lb

## 2021-03-02 DIAGNOSIS — I739 Peripheral vascular disease, unspecified: Secondary | ICD-10-CM | POA: Diagnosis not present

## 2021-03-02 DIAGNOSIS — I2510 Atherosclerotic heart disease of native coronary artery without angina pectoris: Secondary | ICD-10-CM | POA: Diagnosis not present

## 2021-03-02 DIAGNOSIS — I4510 Unspecified right bundle-branch block: Secondary | ICD-10-CM

## 2021-03-02 DIAGNOSIS — R9439 Abnormal result of other cardiovascular function study: Secondary | ICD-10-CM

## 2021-03-02 DIAGNOSIS — E782 Mixed hyperlipidemia: Secondary | ICD-10-CM

## 2021-03-02 DIAGNOSIS — C3492 Malignant neoplasm of unspecified part of left bronchus or lung: Secondary | ICD-10-CM | POA: Diagnosis not present

## 2021-03-02 NOTE — Progress Notes (Signed)
Cardiology Office Note:    Date:  03/02/2021   ID:  Louis Barton, DOB Jan 04, 1941, MRN [id number]  PCP:  Louis Mina., MD  Cardiologist:  Louis Campus, MD    Referring MD: Louis Mina., MD   Chief Complaint  Patient presents with  . Follow-up    History of Present Illness:    Louis Barton is a 80 y.o. male  with past medical history significant for peripheral vascular disease in form of carotic arterial disease, remote coronary artery disease, essential hypertension, dyslipidemia, history of lung CA s/p resection.  He has been coming to me for years however within the last few months he has been experiencing very peculiar symptoms that he have really difficult time to pinpoint what the problem is.  He did wear monitor which showed first-degree AV block as well as second-degree type I AV block but he had no symptoms during those episodes.  He complained of being dizzy especially when he gets up and try to walk them minute later he will get dizzy to the point that he is just sat down.  He check his blood pressure usually blood pressures is good.  Eventually end up moving towards ischemia evaluation and surprisingly stress test show ischemia involving inferior wall. He comes today 2 months of follow-up he said that he is feeling much better.  Denies have any chest pain tightness squeezing pressure burning chest interesting lesion has improved significantly.  In May he did have cardiac catheterization done in exactly May 16 cardiac catheterization showed only 20% stenosis of circumflex as well as 20% stenosis of LAD.  Overall it was determined as mild nonobstructive CAD with mild elevation of LVEDP.  Since that time he is doing much better.  Past Medical History:  Diagnosis Date  . Abnormal stress test   . Aftercare following surgery of the circulatory system, [place] 12/19/2013  . Arthritis    DDD- aging   . 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 Atherosclerosis 11/16/2015  .  Atypical chest pain 06/11/2015  . Benign paroxysmal positional vertigo 08/08/2016  . Bradycardia 03/18/2015  . CAD (coronary artery disease)   . 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 Carotid stenosis   . Chronic kidney disease    cysts on both kidneys, seeing Louis Barton in [place], [facility]   . Coronary arteriosclerosis 11/16/2015   Formatting of this note might be different from the original. On imaging;  . Cramps of left lower extremity-Calf  > right calf 01/01/2015  . 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 signficant DDD present with vacuum effect L4-5 and L5-S1 flat discs and anterior spurring noted  . Dizziness 05/06/2020  . Dyslipidemia 03/18/2015  . Dyspnea on exertion 04/02/2020  . Ear itch 05/14/2018  . Edema 11/16/2015  . Edema of both lower extremities due to peripheral venous insufficiency 02/03/2020  . First degree AV block 10/20/2020  . History of hiatal hernia    seen on last scan- as slight   . History of thrombocytopenia   . History of TIAs   . Hyperlipidemia   . Hypertension   . Irritable bowel syndrome 11/17/2016   rec trial of citrucel/ diet 11/16/2016 >>>     . Local edema 02/07/2018  . Malaise and fatigue 12/08/2020  . 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 Louis Barton. And Dr. Melvyn Barton  . 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 Occlusion and stenosis of carotid artery without mention of cerebral infarction 12/01/2011  . PAD (peripheral artery disease) ([place])   . Prediabetes 12/25/2018  . Preop cardiovascular exam 04/14/2011  . Renal cysts, acquired, bilateral 10/24/2016   Formatting of this note might be different from the original. Renal parapelvic cysts 10/06/16 on CT , right kidney midpole 1.2 cm, right kidney anterior pole 1.1 cm, and to the left kidney lower pole anterior position 3.5cm all felt to be benign  . Right bundle branch  block 10/20/2020  . Right lower lobe pulmonary nodule 11/09/2017  . Shingles    internal- obstruction of bowels & gallbladder  . Solitary pulmonary nodule 11/16/2016   CT ABd 03/24/09 linerar densities in RLL and RML c/w scarring o/w clear CT Abd 10/20/16 c/w 7 mm nodule  - CT chest 02/13/2017 :  slt enlarged to 9 mm   - PET 04/04/17 :1. These slowly enlarging 9 mm right lower lobe pulmonary nodule along the right hemidiaphragm does not appear hypermetabolic today. Location adjacent to the hemidiaphragm can cause false negatives due to motion artifact related to the  . Stage 3a chronic kidney disease ([place]) 06/17/2020  . Stroke ([place])   . Swelling of limb-Left foot 01/01/2015  . Vitamin B12 deficiency 06/21/2017    Past Surgical History:  Procedure Laterality Date  . adenocarcinoma  2019   Removed  . CAROTID ENDARTERECTOMY  2005   Left carotid by Louis Barton  . CAROTID ENDARTERECTOMY  04/25/11   Right Carotid by Louis Barton  . COLONOSCOPY  02/23/2017   Colonic polyp status post polypectomy. Pancolonic diverticulosis predominatly in the sigmoid colon. Tubular adenoma  . LEFT HEART CATH AND CORONARY ANGIOGRAPHY N/A 02/08/2021   Procedure: LEFT HEART CATH AND CORONARY ANGIOGRAPHY;  Surgeon: Louis Blanks, MD;  Location: [facility];  Service: Cardiovascular;  Laterality: N/A;  . Septoplasty, nasal w/ submucosal resection  1960  . SQUAMOUS CELL CARCINOMA EXCISION     pt said numerous times  . TONSILLECTOMY    . VIDEO ASSISTED THORACOSCOPY (VATS)/WEDGE RESECTION Right 11/09/2017   Procedure: RIGHT VIDEO ASSISTED THORACOSCOPY WITH Louis Barton RESECTION;  Surgeon: Louis Nakayama, MD;  Location: [facility];  Service: Thoracic;  Laterality: Right;    Current Medications: Current Meds  Medication Sig  . acetaminophen (TYLENOL) 500 MG tablet Take 1,000 mg by mouth every 6 (six) hours as needed for mild pain or moderate pain (for pain.).  [name] aspirin EC 81 MG tablet Take 81 mg by mouth daily.  .  clopidogrel (PLAVIX) 75 MG tablet Take 75 mg by mouth at bedtime.   . colesevelam (WELCHOL) 625 MG tablet Take 1,250 mg by mouth 2 (two) times daily with a meal.  . furosemide (LASIX) 20 MG tablet Take 1 tablet (20 mg total) by mouth daily.  [name] losartan (COZAAR) 50 MG tablet Take 50 mg by mouth daily.  Vladimir Faster Glycol-Propyl Glycol (SYSTANE ULTRA OP) Place 1 drop into both eyes daily. Unknown strength  . potassium chloride (KLOR-CON) 10 MEQ tablet Take 2 tablets (20 mEq total) by mouth daily.  . rosuvastatin (CRESTOR) 40 MG tablet Take 1 tablet (40 mg total) by mouth daily.  . valACYclovir (VALTREX) 1000 MG tablet Take 1,000 mg by mouth 2 (two) times daily as needed (for fever blisters.).   [name] zinc gluconate 50 MG tablet Take 50 mg by mouth 2 (two)  times a week.     Allergies:   Patient has no known allergies.   Social History   Socioeconomic History  . Marital status: Married    Spouse name: Not on file  . Number of children: 2  . Years of education: Not on file  . Highest education level: Not on file  Occupational History  . Not on file  Tobacco Use  . Smoking status: Former Smoker    Packs/day: 1.00    Years: 16.00    Pack years: 16.00    Types: Cigarettes    Quit date: 09/26/1972    Years since quitting: 48.4  . Smokeless tobacco: Never Used  Vaping Use  . Vaping Use: Never used  Substance and Sexual Activity  . Alcohol use: Yes    Alcohol/week: 2.0 standard drinks    Types: 2 Glasses of wine per week    Comment:  2-3 Drinks of Scotch per Day  . Drug use: No  . Sexual activity: Not on file  Other Topics Concern  . Not on file  Social History Narrative  . Not on file   Social Determinants of Health   Financial Resource Strain: Not on file  Food Insecurity: Not on file  Transportation Needs: Not on file  Physical Activity: Not on file  Stress: Not on file  Social Connections: Not on file     Family History: The patient's family history includes Cancer in his  brother and sister; Cancer (age of onset: 28) in his mother; Deep vein thrombosis in his mother; Diabetes in his father and mother; Heart attack in his father and mother; Heart disease in his mother; Hyperlipidemia in his mother; Hypertension in his mother; Other in his mother and sister; Stroke in his mother; Varicose Veins in his mother. There is no history of Colon cancer or Esophageal cancer. ROS:   Please see the history of present illness.    All 14 point review of systems negative except as described per history of present illness  EKGs/Labs/Other Studies Reviewed:    Cardiac catheterization from 16 May showed:  Mid Cx to Dist Cx lesion is 20% stenosed.  Prox LAD to Mid LAD lesion is 20% stenosed.   1. Mild non-obstructive CAD 2. Mild elevation LVEDP    Recent Labs: 04/02/2020: NT-Pro BNP 327 02/12/2021: ALT 15; BUN 13; Creatinine 1.0; Hemoglobin 15.9; Platelets 155; Potassium 4.1; Sodium 139  Recent Lipid Panel    Component Value Date/Time   CHOL 159 11/21/2018 0836   TRIG 82 11/21/2018 0836   HDL 59 11/21/2018 0836   CHOLHDL 2.7 11/21/2018 0836   LDLCALC 84 11/21/2018 0836    Physical Exam:    VS:  BP 126/74 (BP Location: Left Arm, Patient Position: Sitting)   Pulse 74   Ht 5\' 8"  (1.727 m)   Wt 188 lb (85.3 kg)   SpO2 97%   BMI 28.59 kg/m     Wt Readings from Last 3 Encounters:  03/02/21 188 lb (85.3 kg)  02/15/21 188 lb 12.8 oz (85.6 kg)  02/08/21 186 lb (84.4 kg)     GEN:  Well nourished, well developed in no acute distress HEENT: Normal NECK: No JVD; No carotid bruits LYMPHATICS: No lymphadenopathy CARDIAC: RRR, no murmurs, no rubs, no gallops RESPIRATORY:  Clear to auscultation without rales, wheezing or rhonchi  ABDOMEN: Soft, non-tender, non-distended MUSCULOSKELETAL:  No edema; No deformity  SKIN: Warm and dry LOWER EXTREMITIES: no swelling NEUROLOGIC:  Alert and oriented x 3 PSYCHIATRIC:  Normal affect   ASSESSMENT:    1. PAD (peripheral  artery disease) ([place])   2. Coronary arteriosclerosis   3. Right bundle branch block   4. Malignant neoplasm of left lung, unspecified part of lung ([place])   5. Mixed hyperlipidemia   6. Abnormal stress test    PLAN:    In order of problems listed above:  3. Coronary disease, last cardiac catheterization showed mild nonobstructive disease.  He is on antiplatelet therapy which I will continue, he is also on statin which I will continue. 4. Right bundle branch block chronic problem stable. 5. Malignant neoplasm of lungs apparently stable treatment is observation. 6. Dyslipidemia he is on Crestor which I will continue his last LDL was done in 2020 however, I did review K PN which showed me data from 2 months ago with LDL of 77 HDL 50 this acceptable cholesterol profile on high intensity statin which I will continue. 7. We spent great deal of time talking today about the results of the cardiac catheterization which I reviewed in details with him.  He is access to the right radial artery healed completely looks good   Medication Adjustments/Labs and Tests Ordered: Current medicines are reviewed at length with the patient today.  Concerns regarding medicines are outlined above.  No orders of the defined types were placed in this encounter.  Medication changes: No orders of the defined types were placed in this encounter.   Signed, Park Liter, MD, [facility] 03/02/2021 2:18 PM    [place]

## 2021-03-02 NOTE — Patient Instructions (Signed)

## 2021-03-08 ENCOUNTER — Other Ambulatory Visit: Payer: Self-pay | Admitting: Hematology and Oncology

## 2021-03-08 ENCOUNTER — Encounter: Payer: Self-pay | Admitting: Hematology and Oncology

## 2021-03-08 DIAGNOSIS — C3490 Malignant neoplasm of unspecified part of unspecified bronchus or lung: Secondary | ICD-10-CM

## 2021-03-16 ENCOUNTER — Telehealth: Payer: Self-pay | Admitting: Cardiology

## 2021-03-16 DIAGNOSIS — I872 Venous insufficiency (chronic) (peripheral): Secondary | ICD-10-CM

## 2021-03-16 MED ORDER — FUROSEMIDE 40 MG PO TABS: 40.0000 mg | ORAL_TABLET | Freq: Every day | ORAL | 3 refills | Status: DC

## 2021-03-16 NOTE — Telephone Encounter (Signed)
Spoke with the patient just now and let him know [name] recommendations. He verbalizes understanding.

## 2021-03-16 NOTE — Telephone Encounter (Signed)
Pt c/o swelling: STAT is pt has developed SOB within 24 hours  If swelling, where is the swelling located? Feet & ankles   How much weight have you gained and in what time span? 3-4 lbs in the last 5-6 days   Have you gained 3 pounds in a day or 5 pounds in a week? Yes, see answer to question 2   Do you have a log of your daily weights (if so, list)? Takes weight daily doesn't have written down   Are you currently taking a fluid pill? Yes  Are you currently SOB? Not right now, but is occurring every once in awhile    Have you traveled recently? No   Requesting to speak with Hayley in regards to it.

## 2021-03-18 ENCOUNTER — Other Ambulatory Visit: Payer: Self-pay

## 2021-03-18 ENCOUNTER — Inpatient Hospital Stay (HOSPITAL_COMMUNITY)
Admission: EM | Admit: 2021-03-18 | Discharge: 2021-03-22 | DRG: 308 | Disposition: A | Discharge status: Home / Self Care | Payer: Medicare Other | Attending: Cardiovascular Disease | Admitting: Cardiovascular Disease

## 2021-03-18 ENCOUNTER — Emergency Department (HOSPITAL_COMMUNITY): Payer: Medicare Other

## 2021-03-18 ENCOUNTER — Ambulatory Visit (HOSPITAL_COMMUNITY)
Admission: EM | Admit: 2021-03-18 | Discharge: 2021-03-18 | Disposition: A | Discharge status: Other Acute Inpatient Hospital | Payer: Medicare Other

## 2021-03-18 ENCOUNTER — Telehealth: Payer: Self-pay

## 2021-03-18 DIAGNOSIS — K589 Irritable bowel syndrome without diarrhea: Secondary | ICD-10-CM | POA: Diagnosis present

## 2021-03-18 DIAGNOSIS — R7303 Prediabetes: Secondary | ICD-10-CM | POA: Diagnosis present

## 2021-03-18 DIAGNOSIS — Z7901 Long term (current) use of anticoagulants: Secondary | ICD-10-CM

## 2021-03-18 DIAGNOSIS — Z79899 Other long term (current) drug therapy: Secondary | ICD-10-CM

## 2021-03-18 DIAGNOSIS — I4891 Unspecified atrial fibrillation: Secondary | ICD-10-CM | POA: Diagnosis present

## 2021-03-18 DIAGNOSIS — I2510 Atherosclerotic heart disease of native coronary artery without angina pectoris: Secondary | ICD-10-CM | POA: Diagnosis present

## 2021-03-18 DIAGNOSIS — Z823 Family history of stroke: Secondary | ICD-10-CM

## 2021-03-18 DIAGNOSIS — M1990 Unspecified osteoarthritis, unspecified site: Secondary | ICD-10-CM | POA: Diagnosis present

## 2021-03-18 DIAGNOSIS — E785 Hyperlipidemia, unspecified: Secondary | ICD-10-CM | POA: Diagnosis present

## 2021-03-18 DIAGNOSIS — Z85118 Personal history of other malignant neoplasm of bronchus and lung: Secondary | ICD-10-CM

## 2021-03-18 DIAGNOSIS — I4510 Unspecified right bundle-branch block: Secondary | ICD-10-CM | POA: Diagnosis present

## 2021-03-18 DIAGNOSIS — I440 Atrioventricular block, first degree: Secondary | ICD-10-CM | POA: Diagnosis present

## 2021-03-18 DIAGNOSIS — I10 Essential (primary) hypertension: Secondary | ICD-10-CM | POA: Diagnosis present

## 2021-03-18 DIAGNOSIS — E538 Deficiency of other specified B group vitamins: Secondary | ICD-10-CM | POA: Diagnosis present

## 2021-03-18 DIAGNOSIS — Z7902 Long term (current) use of antithrombotics/antiplatelets: Secondary | ICD-10-CM

## 2021-03-18 DIAGNOSIS — Z83438 Family history of other disorder of lipoprotein metabolism and other lipidemia: Secondary | ICD-10-CM

## 2021-03-18 DIAGNOSIS — I509 Heart failure, unspecified: Secondary | ICD-10-CM | POA: Diagnosis not present

## 2021-03-18 DIAGNOSIS — I4892 Unspecified atrial flutter: Secondary | ICD-10-CM | POA: Diagnosis not present

## 2021-03-18 DIAGNOSIS — Z20822 Contact with and (suspected) exposure to covid-19: Secondary | ICD-10-CM | POA: Diagnosis present

## 2021-03-18 DIAGNOSIS — N1831 Chronic kidney disease, stage 3a: Secondary | ICD-10-CM | POA: Diagnosis present

## 2021-03-18 DIAGNOSIS — Z8673 Personal history of transient ischemic attack (TIA), and cerebral infarction without residual deficits: Secondary | ICD-10-CM

## 2021-03-18 DIAGNOSIS — I130 Hypertensive heart and chronic kidney disease with heart failure and stage 1 through stage 4 chronic kidney disease, or unspecified chronic kidney disease: Secondary | ICD-10-CM | POA: Diagnosis present

## 2021-03-18 DIAGNOSIS — Z86010 Personal history of colonic polyps: Secondary | ICD-10-CM

## 2021-03-18 DIAGNOSIS — Z85828 Personal history of other malignant neoplasm of skin: Secondary | ICD-10-CM

## 2021-03-18 DIAGNOSIS — Z8249 Family history of ischemic heart disease and other diseases of the circulatory system: Secondary | ICD-10-CM

## 2021-03-18 DIAGNOSIS — Z8616 Personal history of COVID-19: Secondary | ICD-10-CM

## 2021-03-18 DIAGNOSIS — Z7982 Long term (current) use of aspirin: Secondary | ICD-10-CM

## 2021-03-18 DIAGNOSIS — Z87891 Personal history of nicotine dependence: Secondary | ICD-10-CM

## 2021-03-18 DIAGNOSIS — I5033 Acute on chronic diastolic (congestive) heart failure: Secondary | ICD-10-CM | POA: Diagnosis present

## 2021-03-18 DIAGNOSIS — I483 Typical atrial flutter: Secondary | ICD-10-CM

## 2021-03-18 DIAGNOSIS — I452 Bifascicular block: Secondary | ICD-10-CM | POA: Diagnosis present

## 2021-03-18 DIAGNOSIS — M5136 Other intervertebral disc degeneration, lumbar region: Secondary | ICD-10-CM | POA: Diagnosis present

## 2021-03-18 DIAGNOSIS — I739 Peripheral vascular disease, unspecified: Secondary | ICD-10-CM | POA: Diagnosis present

## 2021-03-18 DIAGNOSIS — Z833 Family history of diabetes mellitus: Secondary | ICD-10-CM

## 2021-03-18 DIAGNOSIS — I712 Thoracic aortic aneurysm, without rupture: Secondary | ICD-10-CM | POA: Diagnosis present

## 2021-03-18 LAB — COMPREHENSIVE METABOLIC PANEL
ALT: 17 U/L (ref 0–44)
AST: 21 U/L (ref 15–41)
Albumin: 3.6 g/dL (ref 3.5–5.0)
Alkaline Phosphatase: 44 U/L (ref 38–126)
Anion gap: 8 (ref 5–15)
BUN: 12 mg/dL (ref 8–23)
CO2: 26 mmol/L (ref 22–32)
Calcium: 8.7 mg/dL — ABNORMAL LOW (ref 8.9–10.3)
Chloride: 101 mmol/L (ref 98–111)
Creatinine, Ser: 0.91 mg/dL (ref 0.61–1.24)
GFR, Estimated: >60 mL/min (ref >60)
Glucose, Bld: 89 mg/dL (ref 70–99)
Potassium: 4.2 mmol/L (ref 3.5–5.1)
Sodium: 135 mmol/L (ref 135–145)
Total Bilirubin: 0.8 mg/dL (ref 0.3–1.2)
Total Protein: 6.4 g/dL — ABNORMAL LOW (ref 6.5–8.1)

## 2021-03-18 LAB — CBC WITH DIFFERENTIAL/PLATELET
Abs Immature Granulocytes: 0.02 10*3/uL (ref 0.00–0.07)
Basophils Absolute: 0 10*3/uL (ref 0.0–0.1)
Basophils Relative: 0 %
Eosinophils Absolute: 0.2 10*3/uL (ref 0.0–0.5)
Eosinophils Relative: 3 %
HCT: 47.4 % (ref 39.0–52.0)
Hemoglobin: 15.2 g/dL (ref 13.0–17.0)
Immature Granulocytes: 0 %
Lymphocytes Relative: 30 %
Lymphs Abs: 2.1 10*3/uL (ref 0.7–4.0)
MCH: 30.6 pg (ref 26.0–34.0)
MCHC: 32.1 g/dL (ref 30.0–36.0)
MCV: 95.4 fL (ref 80.0–100.0)
Monocytes Absolute: 0.7 10*3/uL (ref 0.1–1.0)
Monocytes Relative: 11 %
Neutro Abs: 3.8 10*3/uL (ref 1.7–7.7)
Neutrophils Relative %: 56 %
Platelets: 149 10*3/uL — ABNORMAL LOW (ref 150–400)
RBC: 4.97 MIL/uL (ref 4.22–5.81)
RDW: 13.8 % (ref 11.5–15.5)
WBC: 6.9 10*3/uL (ref 4.0–10.5)
nRBC: 0 % (ref 0.0–0.2)

## 2021-03-18 LAB — BRAIN NATRIURETIC PEPTIDE: B Natriuretic Peptide: 187.1 pg/mL — ABNORMAL HIGH (ref 0.0–100.0)

## 2021-03-18 MED ORDER — FUROSEMIDE 10 MG/ML IJ SOLN
40.0000 mg | Freq: Once | INTRAMUSCULAR | Status: AC
  Administered 2021-03-19: 40 mg via INTRAVENOUS
  Filled 2021-03-18: qty 4

## 2021-03-18 NOTE — ED Notes (Signed)
Patient is being discharged from the Urgent Care and sent to the Emergency Department via Personal Vehicle with family members. Per Colletta Maryland NP, patient is in need of higher level of care due to Health History and Symptoms. Patient is aware and verbalizes understanding of plan of care.  Vitals:   03/18/21 1648  BP: (!) 157/64  Pulse: (!) 56  SpO2: 98%

## 2021-03-18 NOTE — ED Notes (Signed)
HR noted in 170s before going down to 35. EKG obtained and EDP at bedside

## 2021-03-18 NOTE — ED Triage Notes (Signed)
Pt here with reports of hypertension over the last few days. Pt with recent medication changes. Denies chest pain. Endorses shob with exertion.

## 2021-03-18 NOTE — ED Provider Notes (Signed)
Emergency Medicine Provider Triage Evaluation Note  Louis Barton , a 80 y.o. male  was evaluated in triage.  Pt complains of hypertension.  He recently had changes made to his Lasix/HCTZ.  He has had leg swelling.  No specific chest pain.  He does have shortness of breath with exertion..  Review of Systems  Positive: Leg swelling, hypertension.  Negative: Chest pain  Physical Exam  BP (!) 155/87 (BP Location: Right Arm)   Pulse (!) 56   Temp 98.3 F (36.8 C) (Oral)   Resp 16   SpO2 97%  Gen:   Awake, no distress   Resp:  Normal effort  MSK:   Moves extremities without difficulty  Other:  Obvious edema bilateral lower extremities.  Medical Decision Making  Medically screening exam initiated at 6:16 PM.  Appropriate orders placed.  Louis Barton was informed that the remainder of the evaluation will be completed by another provider, this initial triage assessment does not replace that evaluation, and the importance of remaining in the ED until their evaluation is complete.  Note: Portions of this report may have been transcribed using voice recognition software. Every effort was made to ensure accuracy; however, inadvertent computerized transcription errors may be present    [name] 03/18/21 1817    Wyvonnia Dusky, MD 03/19/21 [phone]

## 2021-03-18 NOTE — Telephone Encounter (Signed)
Spoke to the patients daughter just now and she let me know that the patient is still having significant swelling and the patients PCP said he thinks he needs to have a d-dimer completed. His blood pressure is currently 187/105 and about 30 minutes ago was 191/90 Heart rate at 56 bpm. She states that at this time when he is sitting his O2 will be around 96% but when up and walking around it drops to 89%.  She wants the patient to be seen by Dr. Raliegh Ip today. I advised her that since it is so close to the end of the day [name] does not have any openings for the rest of the afternoon.   I advised that the patient should be seen in the ED for these symptoms as we can not see him today.    Encouraged patient to call back with any questions or concerns.

## 2021-03-18 NOTE — ED Provider Notes (Signed)
[facility] EMERGENCY DEPARTMENT Provider Note   CSN: [id number] Arrival date & time: 03/18/21  1654     History Chief Complaint  Patient presents with   Hypertension    Louis Barton is a 80 y.o. male.  Patient with a history of hypertension, CAD, ascending aortic aneurysm, CKD, lower extremity edema, peripheral vascular disease, lung cancer presenting with a 3-day history of increased leg swelling, dyspnea with exertion and elevated blood pressure.  States his cardiologist switched his blood pressure medicine from hydrochlorothiazide to 40 mg of Lasix about a week ago due to swelling in his legs.  Since then he had increased swelling in his legs over the past 2 days from his family's concern for blood clot.  Blood pressure has been elevated at home in the 1 95-6 70 systolic range.  Patient has noticed increased shortness of breath with exertion and difficulty lying flat but no dyspnea at rest.  Denies chest pain.  Does have a nonproductive cough.  No fever.  No abdominal pain, nausea or vomiting.  No headache.  No focal weakness, numbness or tingling.  No visual changes. Family reports history of elevated D-dimer in the past no concern for blood clot.  Is not anticoagulated but does take aspirin and Plavix. On arrival patient noted to have irregular heart rate and apparently new onset atrial flutter.  The history is provided by the patient and a relative.  Hypertension Pertinent negatives include no abdominal pain, no headaches and no shortness of breath.      Past Medical History:  Diagnosis Date   Abnormal stress test    Aftercare following surgery of the circulatory system, NEC 12/19/2013   Arthritis    DDD- aging    Ascending aortic aneurysm ([place]) 05/15/2020   Atherosclerosis 11/16/2015   Atypical chest pain 06/11/2015   Benign paroxysmal positional vertigo 08/08/2016   Bradycardia 03/18/2015   CAD (coronary artery disease)    Cancer (HCC)    squamous & basal  cell - both have been addressed - arm & leg & nose    Carotid stenosis    Chronic kidney disease    cysts on both kidneys, seeing [name] in [place], [facility]    Coronary arteriosclerosis 11/16/2015   Formatting of this note might be different from the original. 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 signficant DDD present with vacuum effect L4-5 and L5-S1 flat discs and anterior spurring noted   Dizziness 05/06/2020   Dyslipidemia 03/18/2015   Dyspnea on exertion 04/02/2020   Ear itch 05/14/2018   Edema 11/16/2015   Edema of both lower extremities due to peripheral venous insufficiency 02/03/2020   First degree AV block 10/20/2020   History of hiatal hernia    seen on last scan- as slight    History of thrombocytopenia    History of TIAs    Hyperlipidemia    Hypertension    Irritable bowel syndrome 11/17/2016   rec trial of citrucel/ diet 11/16/2016 >>>      Local edema 02/07/2018   Malaise and fatigue 12/08/2020   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 [place]. And Dr. 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 Renal parapelvic cysts 10/06/16 on CT , right kidney midpole 1.2 cm, right kidney anterior pole 1.1 cm, and to the left kidney lower pole anterior position 3.5cm all felt to be benign   Right bundle branch block 10/20/2020   Right lower lobe pulmonary nodule 11/09/2017   Shingles    internal- obstruction of bowels & gallbladder   Solitary pulmonary nodule 11/16/2016   CT ABd 03/24/09 linerar densities in RLL and  RML c/w scarring o/w clear CT Abd 10/20/16 c/w 7 mm nodule  - CT chest 02/13/2017 :  slt enlarged to 9 mm   - PET 04/04/17 :1. These slowly enlarging 9 mm right lower lobe pulmonary nodule along the right hemidiaphragm does not appear hypermetabolic today. Location adjacent to the hemidiaphragm can cause false negatives due to motion artifact related to the   Stage 3a chronic kidney disease ([place]) 06/17/2020   Stroke ([place])    Swelling of limb-Left foot 01/01/2015   Vitamin B12 deficiency 06/21/2017    Patient Active Problem List   Diagnosis Date Noted   Hypertension    History of TIAs    History of hiatal hernia    Carotid stenosis    CAD (coronary artery disease)    Arthritis    Abnormal stress test    Malaise and fatigue 12/08/2020   Right bundle branch block 10/20/2020   First degree AV block 10/20/2020   Stage 3a chronic kidney disease ([place]) 06/17/2020   Ascending aortic aneurysm ([place]) 05/15/2020   Dizziness 05/06/2020   Stroke ([place])    Shingles    PAD (peripheral artery disease) ([place])    History of thrombocytopenia    Cancer ([place])    Chronic kidney disease    Dyspnea on exertion 04/02/2020   Edema of both lower extremities due to peripheral venous insufficiency 02/03/2020   Prediabetes 12/25/2018   Ear itch 05/14/2018   Local edema 02/07/2018   Malignant neoplasm of left lung ([place]) 12/08/2017   Right lower lobe pulmonary nodule 11/09/2017   Vitamin B12 deficiency 06/21/2017   Irritable bowel syndrome 11/17/2016   Solitary pulmonary nodule 11/16/2016   Renal cysts, acquired, bilateral 10/24/2016   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 Oneida Alar   CAROTID ENDARTERECTOMY  04/25/11   Right Carotid by Dr. Oneida Alar   COLONOSCOPY  02/23/2017   Colonic polyp status post polypectomy. Pancolonic diverticulosis predominatly in the sigmoid colon. Tubular adenoma   LEFT HEART CATH AND CORONARY ANGIOGRAPHY N/A 02/08/2021   Procedure: LEFT HEART CATH AND CORONARY ANGIOGRAPHY;  Surgeon: Burnell Blanks, MD;  Location: [place] [facility];  Service: Cardiovascular;  Laterality: N/A;   Septoplasty, nasal w/ submucosal resection  1960   SQUAMOUS CELL CARCINOMA EXCISION     pt said  numerous times   TONSILLECTOMY     VIDEO ASSISTED THORACOSCOPY (VATS)/WEDGE RESECTION Right 11/09/2017   Procedure: RIGHT VIDEO ASSISTED THORACOSCOPY WITH Chales Salmon RESECTION;  Surgeon: [name], MD;  Location: [place];  Service: Thoracic;  Laterality: Right;       Family History  Problem Relation Age of Onset   Cancer Mother 10       Breast   Stroke Mother    Diabetes Mother    Hyperlipidemia Mother    Other Mother        varicose veins   Heart attack Mother    Heart disease Mother        Left ankle swelling   Hypertension Mother    Deep vein thrombosis Mother    Varicose Veins Mother    Diabetes Father    Heart attack Father    Other Sister        Tumor in Lung and Brain   Cancer Sister        Tumor   Lung  and  Brain   Cancer Brother        BCC-SCC-Merkle Cell   Colon cancer Neg Hx    Esophageal cancer Neg Hx     Social History   Tobacco Use   Smoking status: Former    Packs/day: 1.00    Years: 16.00    Pack years: 16.00    Types: Cigarettes    Quit date: 09/26/1972    Years since quitting: 48.5   Smokeless tobacco: Never  Vaping Use   Vaping Use: Never used  Substance Use Topics   Alcohol use:  Yes    Alcohol/week: 2.0 standard drinks    Types: 2 Glasses of wine per week    Comment:  2-3 Drinks of Scotch per Day   Drug use: No    Home Medications Prior to Admission medications   Medication Sig Start Date End Date Taking? Authorizing Provider  acetaminophen (TYLENOL) 500 MG tablet Take 1,000 mg by mouth every 6 (six) hours as needed for mild pain or moderate pain (for pain.).    [provider]  aspirin EC 81 MG tablet Take 81 mg by mouth daily.    [provider]  clopidogrel (PLAVIX) 75 MG tablet Take 75 mg by mouth at bedtime.     [provider]  colesevelam (WELCHOL) 625 MG tablet Take 1,250 mg by mouth 2 (two) times daily with a meal.    [provider]  furosemide (LASIX) 40 MG tablet Take 1 tablet (40 mg total) by mouth daily. 03/16/21 06/14/21  Park Liter, MD  losartan (COZAAR) 50 MG tablet Take 50 mg by mouth daily. 11/24/20   [provider]  Polyethyl Glycol-Propyl Glycol (SYSTANE ULTRA OP) Place 1 drop into both eyes daily. Unknown strength    [provider]  potassium chloride (KLOR-CON) 10 MEQ tablet Take 2 tablets (20 mEq total) by mouth daily. 01/27/21 04/27/21  Park Liter, MD  rosuvastatin (CRESTOR) 40 MG tablet Take 1 tablet (40 mg total) by mouth daily. 04/14/11   Wellington Hampshire, MD  valACYclovir (VALTREX) 1000 MG tablet Take 1,000 mg by mouth 2 (two) times daily as needed (for fever blisters.).  05/19/17   [provider]  zinc gluconate 50 MG tablet Take 50 mg by mouth 2 (two) times a week.    [provider]    Allergies    Patient has no known allergies.  Review of Systems  Review of Systems  Constitutional:  Positive for fatigue. Negative for activity change, appetite change and fever.  HENT:  Negative for congestion and rhinorrhea.   Eyes:  Negative for visual disturbance.  Respiratory:  Negative for cough, chest tightness and shortness of breath.   Cardiovascular:   Positive for palpitations and leg swelling.  Gastrointestinal:  Negative for abdominal pain, nausea and vomiting.  Genitourinary:  Negative for dysuria and flank pain.  Musculoskeletal:  Negative for arthralgias and myalgias.  Skin:  Negative for rash.  Neurological:  Positive for weakness. Negative for dizziness and headaches.   all other systems are negative except as noted in the HPI and PMH.   Physical Exam Updated Vital Signs BP (!) 176/81 (BP Location: Right Arm)   Pulse (!) 47   Temp 98.3 F (36.8 C) (Oral)   Resp 16   SpO2 98%   Physical Exam Vitals and nursing note reviewed.  Constitutional:      General: He is not in acute distress.    Appearance: He is well-developed.  HENT:     Head: Normocephalic and atraumatic.     Mouth/Throat:     Pharynx: No oropharyngeal exudate.  Eyes:     Conjunctiva/sclera: Conjunctivae normal.     Pupils: Pupils are equal, round, and reactive to light.  Neck:     Comments: No meningismus. Cardiovascular:     Rate and Rhythm: Normal rate. Rhythm irregular.     Heart sounds: Normal heart sounds. No murmur heard.    Comments: Irregular rhythm 40s to 130s Pulmonary:     Effort: Pulmonary effort is normal. No respiratory distress.     Breath sounds: Normal breath sounds.  Abdominal:     Palpations: Abdomen is soft.     Tenderness: There is no abdominal tenderness. There is no guarding or rebound.  Musculoskeletal:        General: No tenderness. Normal range of motion.     Cervical back: Normal range of motion and neck supple.     Right lower leg: Edema present.     Left lower leg: Edema present.  Skin:    General: Skin is warm.  Neurological:     Mental Status: He is alert and oriented to person, place, and time.     Cranial Nerves: No cranial nerve deficit.     Motor: No abnormal muscle tone.     Coordination: Coordination normal.     Comments:  5/5 strength throughout. CN 2-12 intact.Equal grip strength.   Psychiatric:         Behavior: Behavior normal.    ED Results / Procedures / Treatments   Labs (all labs ordered are listed, but only abnormal results are displayed) Labs Reviewed  CBC WITH DIFFERENTIAL/PLATELET - Abnormal; Notable for the following components:      Result Value   Platelets 149 (*)    All other components within normal limits  COMPREHENSIVE METABOLIC PANEL - Abnormal; Notable for the following components:   Calcium 8.7 (*)    Total Protein 6.4 (*)    All other components within normal limits  BRAIN NATRIURETIC PEPTIDE - Abnormal; Notable for the following components:   B Natriuretic Peptide 187.1 (*)    All other components within normal limits  RESP PANEL BY RT-PCR (FLU A&B, COVID) ARPGX2  D-DIMER, QUANTITATIVE  TROPONIN I (HIGH SENSITIVITY)    EKG EKG Interpretation  Date/Time:  Thursday March 18 2021 22:56:05 EDT Ventricular Rate:  36 PR Interval:  QRS Duration: 142 QT Interval:  499 QTC Calculation: 387 R Axis:   85 Text Interpretation: Atrial flutter Right bundle branch block new Atrial fibrillation Confirmed by Ezequiel Essex [phone]) on 03/18/2021 10:59:43 PM  Radiology No results found.  Procedures Procedures   Medications Ordered in ED Medications - No data to display  ED Course  I have reviewed the triage vital signs and the nursing notes.  Pertinent labs & imaging results that were available during my care of the patient were reviewed by me and considered in my medical decision making (see chart for details).    MDM Rules/Calculators/A&P                         Several days of increased leg swelling, elevated blood pressure and dyspnea with exertion.  Has new onset atrial flutter.  Blood pressure stable mental status stable.  Catheterization earlier this year showed no obstructive CAD.  Does have a normal EF on last echocardiogram.  Chest x-ray without significant edema.  IV Lasix given.  Troponin negative.  Age-adjusted D-dimer is negative.  PE  considered less likely.  Discussed with cardiology Dr. Clayton Bibles.  He will evaluate patient.  Agrees with admission for diuresis as well as further evaluation of his atrial flutter.  Patient will need anticoagulation which he will discuss with patient. He is already on plavix and ASA.  Admission discussed with Dr. Marlowe Sax.   This patients CHA2DS2-VASc Score and unadjusted Ischemic Stroke Rate (% per year) is equal to 4.8 % stroke rate/year from a score of 4  Above score calculated as 1 point each if present [CHF, HTN, DM, Vascular=MI/PAD/Aortic Plaque, Age if 65-74, or Male] Above score calculated as 2 points each if present [Age > 75, or Stroke/TIA/TE]  Final Clinical Impression(s) / ED Diagnoses Final diagnoses:  Atrial flutter, unspecified type [facility])    Rx / [place] Orders ED Discharge Orders     None        Karsyn Rochin, Annie Main, MD 03/19/21 [phone]

## 2021-03-19 ENCOUNTER — Other Ambulatory Visit (HOSPITAL_COMMUNITY): Payer: Self-pay

## 2021-03-19 ENCOUNTER — Inpatient Hospital Stay (HOSPITAL_COMMUNITY): Payer: Medicare Other

## 2021-03-19 ENCOUNTER — Encounter (HOSPITAL_COMMUNITY): Payer: Self-pay | Admitting: Internal Medicine

## 2021-03-19 DIAGNOSIS — I2510 Atherosclerotic heart disease of native coronary artery without angina pectoris: Secondary | ICD-10-CM | POA: Diagnosis present

## 2021-03-19 DIAGNOSIS — Z86010 Personal history of colonic polyps: Secondary | ICD-10-CM | POA: Diagnosis not present

## 2021-03-19 DIAGNOSIS — I452 Bifascicular block: Secondary | ICD-10-CM | POA: Diagnosis present

## 2021-03-19 DIAGNOSIS — E785 Hyperlipidemia, unspecified: Secondary | ICD-10-CM | POA: Diagnosis present

## 2021-03-19 DIAGNOSIS — I4891 Unspecified atrial fibrillation: Secondary | ICD-10-CM | POA: Diagnosis present

## 2021-03-19 DIAGNOSIS — I340 Nonrheumatic mitral (valve) insufficiency: Secondary | ICD-10-CM | POA: Diagnosis not present

## 2021-03-19 DIAGNOSIS — I4892 Unspecified atrial flutter: Principal | ICD-10-CM

## 2021-03-19 DIAGNOSIS — Z85118 Personal history of other malignant neoplasm of bronchus and lung: Secondary | ICD-10-CM | POA: Diagnosis not present

## 2021-03-19 DIAGNOSIS — I712 Thoracic aortic aneurysm, without rupture: Secondary | ICD-10-CM | POA: Diagnosis not present

## 2021-03-19 DIAGNOSIS — I440 Atrioventricular block, first degree: Secondary | ICD-10-CM | POA: Diagnosis not present

## 2021-03-19 DIAGNOSIS — I739 Peripheral vascular disease, unspecified: Secondary | ICD-10-CM | POA: Diagnosis not present

## 2021-03-19 DIAGNOSIS — Z8616 Personal history of COVID-19: Secondary | ICD-10-CM | POA: Diagnosis not present

## 2021-03-19 DIAGNOSIS — Z20822 Contact with and (suspected) exposure to covid-19: Secondary | ICD-10-CM | POA: Diagnosis not present

## 2021-03-19 DIAGNOSIS — Z85828 Personal history of other malignant neoplasm of skin: Secondary | ICD-10-CM | POA: Diagnosis not present

## 2021-03-19 DIAGNOSIS — E7800 Pure hypercholesterolemia, unspecified: Secondary | ICD-10-CM | POA: Diagnosis not present

## 2021-03-19 DIAGNOSIS — E538 Deficiency of other specified B group vitamins: Secondary | ICD-10-CM | POA: Diagnosis not present

## 2021-03-19 DIAGNOSIS — I4510 Unspecified right bundle-branch block: Secondary | ICD-10-CM | POA: Diagnosis not present

## 2021-03-19 DIAGNOSIS — I483 Typical atrial flutter: Secondary | ICD-10-CM

## 2021-03-19 DIAGNOSIS — M1990 Unspecified osteoarthritis, unspecified site: Secondary | ICD-10-CM | POA: Diagnosis not present

## 2021-03-19 DIAGNOSIS — I130 Hypertensive heart and chronic kidney disease with heart failure and stage 1 through stage 4 chronic kidney disease, or unspecified chronic kidney disease: Secondary | ICD-10-CM | POA: Diagnosis not present

## 2021-03-19 DIAGNOSIS — I509 Heart failure, unspecified: Secondary | ICD-10-CM | POA: Diagnosis present

## 2021-03-19 DIAGNOSIS — K589 Irritable bowel syndrome without diarrhea: Secondary | ICD-10-CM | POA: Diagnosis present

## 2021-03-19 DIAGNOSIS — I351 Nonrheumatic aortic (valve) insufficiency: Secondary | ICD-10-CM | POA: Diagnosis not present

## 2021-03-19 DIAGNOSIS — I5033 Acute on chronic diastolic (congestive) heart failure: Secondary | ICD-10-CM | POA: Diagnosis not present

## 2021-03-19 DIAGNOSIS — M5136 Other intervertebral disc degeneration, lumbar region: Secondary | ICD-10-CM | POA: Diagnosis present

## 2021-03-19 DIAGNOSIS — R7303 Prediabetes: Secondary | ICD-10-CM | POA: Diagnosis present

## 2021-03-19 DIAGNOSIS — N1831 Chronic kidney disease, stage 3a: Secondary | ICD-10-CM | POA: Diagnosis not present

## 2021-03-19 DIAGNOSIS — Z8673 Personal history of transient ischemic attack (TIA), and cerebral infarction without residual deficits: Secondary | ICD-10-CM | POA: Diagnosis not present

## 2021-03-19 DIAGNOSIS — I10 Essential (primary) hypertension: Secondary | ICD-10-CM | POA: Diagnosis not present

## 2021-03-19 DIAGNOSIS — E782 Mixed hyperlipidemia: Secondary | ICD-10-CM

## 2021-03-19 DIAGNOSIS — Z823 Family history of stroke: Secondary | ICD-10-CM | POA: Diagnosis not present

## 2021-03-19 HISTORY — DX: Typical atrial flutter: I48.3

## 2021-03-19 LAB — RESP PANEL BY RT-PCR (FLU A&B, COVID) ARPGX2
Influenza A by PCR: NEGATIVE
Influenza B by PCR: NEGATIVE
SARS Coronavirus 2 by RT PCR: NEGATIVE

## 2021-03-19 LAB — ECHOCARDIOGRAM COMPLETE
AR max vel: 1.97 cm2
AV Area VTI: 2.24 cm2
AV Area mean vel: 1.96 cm2
AV Mean grad: 2.7 mmHg
AV Peak grad: 5.1 mmHg
Ao pk vel: 1.13 m/s
Area-P 1/2: 4.89 cm2
Height: 68 in
S' Lateral: 3.2 cm
Weight: 2955.2 oz

## 2021-03-19 LAB — TROPONIN I (HIGH SENSITIVITY)
Troponin I (High Sensitivity): 14 ng/L (ref <18)
Troponin I (High Sensitivity): 13 ng/L (ref <18)

## 2021-03-19 LAB — TSH: TSH: 2.64 u[IU]/mL (ref 0.350–4.500)

## 2021-03-19 LAB — D-DIMER, QUANTITATIVE: D-Dimer, Quant: 0.74 ug/mL-FEU — ABNORMAL HIGH (ref 0.00–0.50)

## 2021-03-19 MED ORDER — APIXABAN 5 MG PO TABS: 5.0000 mg | ORAL_TABLET | Freq: Two times a day (BID) | ORAL | Status: DC

## 2021-03-19 MED ORDER — POTASSIUM CHLORIDE ER 10 MEQ PO TBCR
20.0000 meq | EXTENDED_RELEASE_TABLET | Freq: Every day | ORAL | Status: AC
  Administered 2021-03-19: 20 meq via ORAL
  Filled 2021-03-19 (×2): qty 2

## 2021-03-19 MED ORDER — COLESEVELAM HCL 625 MG PO TABS
1250.0000 mg | ORAL_TABLET | Freq: Two times a day (BID) | ORAL | Status: DC
  Administered 2021-03-19 – 2021-03-22 (×6): 1250 mg via ORAL
  Filled 2021-03-19 (×8): qty 2

## 2021-03-19 MED ORDER — HEPARIN (PORCINE) 25000 UT/250ML-% IV SOLN: 1200.0000 [IU]/h | INTRAVENOUS | Status: DC

## 2021-03-19 MED ORDER — FUROSEMIDE 10 MG/ML IJ SOLN
40.0000 mg | Freq: Every day | INTRAMUSCULAR | Status: DC
  Filled 2021-03-19: qty 4

## 2021-03-19 MED ORDER — ACETAMINOPHEN 650 MG RE SUPP: 650.0000 mg | Freq: Four times a day (QID) | RECTAL | Status: DC | PRN

## 2021-03-19 MED ORDER — HEPARIN BOLUS VIA INFUSION
4000.0000 [IU] | Freq: Once | INTRAVENOUS | Status: DC
  Filled 2021-03-19: qty 4000

## 2021-03-19 MED ORDER — FUROSEMIDE 10 MG/ML IJ SOLN
40.0000 mg | Freq: Every day | INTRAMUSCULAR | Status: AC
  Administered 2021-03-19: 40 mg via INTRAVENOUS

## 2021-03-19 MED ORDER — LOSARTAN POTASSIUM 50 MG PO TABS
50.0000 mg | ORAL_TABLET | Freq: Every day | ORAL | Status: DC
  Administered 2021-03-19 – 2021-03-22 (×4): 50 mg via ORAL
  Filled 2021-03-19 (×4): qty 1

## 2021-03-19 MED ORDER — ACETAMINOPHEN 325 MG PO TABS
650.0000 mg | ORAL_TABLET | Freq: Four times a day (QID) | ORAL | Status: DC | PRN
  Administered 2021-03-19 – 2021-03-21 (×2): 650 mg via ORAL
  Filled 2021-03-19 (×2): qty 2

## 2021-03-19 MED ORDER — ROSUVASTATIN CALCIUM 20 MG PO TABS
40.0000 mg | ORAL_TABLET | Freq: Every day | ORAL | Status: DC
  Administered 2021-03-19 – 2021-03-21 (×3): 40 mg via ORAL
  Filled 2021-03-19 (×3): qty 2

## 2021-03-19 MED ORDER — POTASSIUM CHLORIDE ER 10 MEQ PO TBCR
20.0000 meq | EXTENDED_RELEASE_TABLET | Freq: Every day | ORAL | Status: DC
  Filled 2021-03-19: qty 2

## 2021-03-19 MED ORDER — APIXABAN 5 MG PO TABS
5.0000 mg | ORAL_TABLET | Freq: Two times a day (BID) | ORAL | Status: DC
  Administered 2021-03-19 – 2021-03-22 (×7): 5 mg via ORAL
  Filled 2021-03-19 (×7): qty 1

## 2021-03-19 NOTE — Progress Notes (Addendum)
Progress Note  Patient Name: Louis Barton Date of Encounter: 03/19/2021  Primary Cardiologist: Jenne Campus, MD  Subjective   Feeling a little better this AM, less SOB, but edema persists. No acute dizziness this AM. States this was more of a problem 2-3 months ago but less frequent recently. When internal medicine was evaluating patient in the room at the same time, he said possibly has had some abdominal distension/bloating but he says it's difficult to tell.  Inpatient Medications    Scheduled Meds:  apixaban  5 mg Oral BID   colesevelam  1,250 mg Oral BID WC   furosemide  40 mg Intravenous Daily   losartan  50 mg Oral Daily   rosuvastatin  40 mg Oral QHS   Continuous Infusions:  PRN Meds: acetaminophen **OR** acetaminophen   Vital Signs    Vitals:   03/19/21 0442 03/19/21 0443 03/19/21 0530 03/19/21 0716  BP:   140/77 (!) 147/80  Pulse:   (!) 52 (!) 51  Resp:   18 16  Temp:   97.8 F (36.6 C) 98 F (36.7 C)  TempSrc:   Oral   SpO2: (!) 89% 94% 97% 97%  Weight:      Height:        Intake/Output Summary (Last 24 hours) at 03/19/2021 0815 Last data filed at 03/19/2021 0737 Gross per 24 hour  Intake 0 ml  Output 1325 ml  Net -1325 ml   Last 3 Weights 03/19/2021 03/02/2021 02/15/2021  Weight (lbs) 184 lb 11.2 oz 188 lb 188 lb 12.8 oz  Weight (kg) 83.779 kg 85.276 kg 85.639 kg     Telemetry    Atrial flutter SVR HR mid 30s-50s - Personally Reviewed  Physical Exam   GEN: No acute distress.  HEENT: Normocephalic, atraumatic, sclera non-icteric. Neck: No JVD or bruits. Cardiac: Irregularly irregular, bradycardic, no murmurs, rubs, or gallops.  Respiratory: Decreased BS R lung base in setting of prior lung recention otherwise clear to auscultation bilaterally. Breathing is unlabored. GI: Soft, nontender, non-distended, BS +x 4. MS: no deformity. Extremities: No clubbing or cyanosis. 1+ LE/pedal edema. Distal pedal pulses are 2+ and equal  bilaterally. Neuro:  AAOx3. Follows commands. Psych:  Responds to questions appropriately with a normal affect - cheerful.  Labs    High Sensitivity Troponin:   Recent Labs  Lab 03/18/21 2259 03/19/21 0128  TROPONINIHS 14 13      Cardiac EnzymesNo results for input(s): TROPONINI in the last 168 hours. No results for input(s): TROPIPOC in the last 168 hours.   Chemistry Recent Labs  Lab 03/18/21 1816  NA 135  K 4.2  CL 101  CO2 26  GLUCOSE 89  BUN 12  CREATININE 0.91  CALCIUM 8.7*  PROT 6.4*  ALBUMIN 3.6  AST 21  ALT 17  ALKPHOS 44  BILITOT 0.8  GFRNONAA >60  ANIONGAP 8     Hematology Recent Labs  Lab 03/18/21 1816  WBC 6.9  RBC 4.97  HGB 15.2  HCT 47.4  MCV 95.4  MCH 30.6  MCHC 32.1  RDW 13.8  PLT 149*    BNP Recent Labs  Lab 03/18/21 1816  BNP 187.1*     DDimer  Recent Labs  Lab 03/18/21 2259  DDIMER 0.74*     Radiology    DG Chest Portable 1 View  Result Date: 03/18/2021 CLINICAL DATA:  80 year old male with shortness of breath. EXAM: PORTABLE CHEST 1 VIEW COMPARISON:  Chest radiograph dated 01/19/2021. FINDINGS: Chronic right  lung base atelectasis/scarring pleural thickening. The lungs are clear. There is no pneumothorax. Stable cardiomediastinal silhouette. Atherosclerotic calcification of the aorta. No acute osseous pathology. IMPRESSION: 1. No acute cardiopulmonary process. 2. Stable chronic changes of the right lung base. Electronically Signed   By: Anner Crete M.D.   On: 03/18/2021 23:23    Cardiac Studies   Cardiac Cath 01/2021 Mid Cx to Dist Cx lesion is 20% stenosed. Prox LAD to Mid LAD lesion is 20% stenosed.   1. Mild non-obstructive CAD 2. Mild elevation LVEDP   Recommendations: Medical management of mild CAD  2D Echo 03/2020   1. Left ventricular ejection fraction, by estimation, is 60 to 65%. The  left ventricle has normal function. The left ventricle has no regional  wall motion abnormalities. There is mild  left ventricular hypertrophy.  Left ventricular diastolic parameters  were normal.   2. Right ventricular systolic function is normal. The right ventricular  size is normal. Tricuspid regurgitation signal is inadequate for assessing  PA pressure.   3. The mitral valve is normal in structure. Trivial mitral valve  regurgitation.   4. The aortic valve was not well visualized. Aortic valve regurgitation  is not visualized. No aortic stenosis is present.   5. Aortic dilatation noted. There is mild dilatation of the ascending  aorta measuring 38 mm.   6. The inferior vena cava is normal in size with greater than 50%  respiratory variability, suggesting right atrial pressure of 3 mmHg.   Patient Profile     80 y.o. 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  He has been following with [name] over the last year for various symptoms including lower extremity edema, weakness, fatigue, DOE and exhaustion. 03/2020 BNP was normal. 2D echo 06/2020 had shown normal EF. He had worn a heart monitor 09/2020 that showed 1st degree AVB, 1 episode of asymptomatic 2nd degree type 1 AVB, 1 episode of NSVT (5 beats), 1 run SVT (4 beats). He had an abnormal stress test 12/2020 prompting cardiac Cath 01/2021 showed only 20% Cx, 20% LAD, mildly elevated LVEDP. He recently noticed elevated BP and slow HR with worsening edema so came to the ER and was found to be in atrial flutter with SVR.  Assessment & Plan    1. Atrial flutter with slow ventricular response (new), with prior underlying conduction disease with intermittent RBBB, first degree AVB and prior 2nd dgree AVB type 1 - duration of flutter not known but his symptoms pre-date onset of this arrhythmia so rate/rhythm are  not clearly related to his previous symptomatology - he is not acutely dizzy or hemodynamically unstable so does not warrant urgent pacemaker - however, with evidence of underlying conduction disease I feel he is at increased risk for requiring a pacemaker in the near future - I discussed case with Joseph Art, PA with our EP team, as well as Dr. Audie Box - continue plan for Eliquis, then proceed with TEE/DCCV on Monday. Per EP APP, there is a possibility that he could revert back to prior baseline HRs and may not require device, but in the event that HR remains slow, may need pacemaker  - originally there was consideration for heparin instead but confirmed that EP Team prefers to continue plan for Eliquis (d/w pharmacist) -  have arranged TEE/DCCV for Monday with Dr. Stanford Breed at 7:30am, orders written - OK to eat today - check thyroid  Shared Decision Making/Informed Consent The risks [stroke, cardiac arrhythmias rarely resulting in the need for a temporary or permanent pacemaker, skin irritation or burns, esophageal damage, perforation (1:10,000 risk), bleeding, pharyngeal hematoma as well as other potential complications associated with conscious sedation including aspiration, arrhythmia, respiratory failure and death], benefits (treatment guidance, restoration of normal sinus rhythm, diagnostic support) and alternatives of a transesophageal echocardiogram guided cardioversion were discussed in detail with Mr. Verdi and he is willing to proceed.   2. Shortness of breath, lower extremity edema, felt multifactorial with possible acute diastolic CHF  - these symptoms were present for the last year, independent of new onset flutter, so may need outpatient pulmonary workup follow discharge - BNP 187 -> written to start IV Lasix, agree with continuing through today and reassessing in AM - will continue home potassium 24meq this AM -  please review plan for potassium tomorrow in context of subsequent diuretic  dosing - follow daily weights, I/O's - d-dimer 0.74 but felt normal for age-adjustment, will defer to IM whether PE/DVT workup needed - of note, LE venous duplex in 2021 showed "interstitial fluid noted throughout the calf - Possible obstruction  proximal to the inguinal ligament" - per d/w Dr. Audie Box, will order CT abd/pelvis without contrast  3. Essential HTN - hypertensive on arrival, 140s this AM, follow with losartan - would avoid aggressively lowering given his low HR  4. Hyperlipidemia - continue statin  5. Carotid artery disease s/p prior CEAs L 2005, R 2012 - continue statin - DAPT stoppped in order to start anticoagulation instead  6. Mild dilation of ascending aorta by echo 2021 - f/u echo pending  For questions or updates, please contact [place] Please consult [email or web address] for contact info under Cardiology/STEMI.  Signed, Charlie Pitter, PA-C 03/19/2021, 8:15 AM

## 2021-03-19 NOTE — Progress Notes (Signed)
Patient ambulated approximately 80 feet with RN. SpO2 at 89-91%, RA, during ambulation. SpO2 at rest upon returning to bed at 94%, RA.

## 2021-03-19 NOTE — Progress Notes (Signed)
ANTICOAGULATION CONSULT NOTE - Initial Consult  Pharmacy Consult for Apixaban  Indication: Atrial flutter  Not on File  Patient Measurements: Height: 5\' 8"  (172.7 cm) Weight: 83.8 kg (184 lb 11.2 oz) (scale b) IBW/kg (Calculated) : 68.4  Vital Signs: Temp: 98.2 F (36.8 C) (06/24 0202) Temp Source: Oral (06/24 0202) BP: 155/74 (06/24 0202) Pulse Rate: 91 (06/24 0202)  Labs: Recent Labs    03/18/21 1816 03/18/21 2259 03/19/21 0128  HGB 15.2  --   --   HCT 47.4  --   --   PLT 149*  --   --   CREATININE 0.91  --   --   TROPONINIHS  --  14 13    Estimated Creatinine Clearance: 66 mL/min (by C-G formula based on SCr of 0.91 mg/dL).   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 signficant DDD present with vacuum effect L4-5 and L5-S1 flat discs and anterior spurring noted   Dizziness 05/06/2020   Dyslipidemia 03/18/2015   Dyspnea on exertion 04/02/2020   Ear itch 05/14/2018   Edema 11/16/2015   Edema of both lower extremities due to peripheral venous insufficiency 02/03/2020   First degree AV block 10/20/2020   History of hiatal  hernia    seen on last scan- as slight    History of thrombocytopenia    History of TIAs    Hyperlipidemia    Hypertension    Irritable bowel syndrome 11/17/2016   rec trial of citrucel/ diet 11/16/2016 >>>      Local edema 02/07/2018   Malaise and fatigue 12/08/2020   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 [place]. And Dr. 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 These slowly enlarging 9 mm right lower lobe pulmonary nodule along the right hemidiaphragm does not appear hypermetabolic today. 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 To begin oral anti-coagulant therapy with Apixaban. CBC/renal function good. No major drug interactions identified.  Goal of Therapy:  Monitor platelets by anticoagulation protocol: Yes   Plan:  Apixaban 5 mg BID Daily CBC Monitor for bleeding Will need Apixaban  education   Narda Bonds, PharmD, BCPS Clinical Pharmacist Phone: [phone]

## 2021-03-19 NOTE — Progress Notes (Addendum)
ANTICOAGULATION CONSULT NOTE - Initial Consult  Pharmacy Consult for heparin Indication: atrial fibrillation  Not on File  Patient Measurements: Height: 5\' 8"  (172.7 cm) Weight: 83.8 kg (184 lb 11.2 oz) (scale b) IBW/kg (Calculated) : 68.4 Heparin Dosing Weight: 83.8 kg   Vital Signs: Temp: 98 F (36.7 C) (06/24 0716) Temp Source: Oral (06/24 0530) BP: 147/80 (06/24 0716) Pulse Rate: 51 (06/24 0716)  Labs: Recent Labs    03/18/21 1816 03/18/21 2259 03/19/21 0128  HGB 15.2  --   --   HCT 47.4  --   --   PLT 149*  --   --   CREATININE 0.91  --   --   TROPONINIHS  --  14 13    Estimated Creatinine Clearance: 66 mL/min (by C-G formula based on SCr of 0.91 mg/dL).   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 signficant DDD present with vacuum effect L4-5 and L5-S1 flat discs and anterior spurring noted   Dizziness 05/06/2020   Dyslipidemia 03/18/2015   Dyspnea on exertion 04/02/2020   Ear itch 05/14/2018   Edema 11/16/2015   Edema of both lower extremities due to peripheral venous insufficiency 02/03/2020   First degree AV block  10/20/2020   History of hiatal hernia    seen on last scan- as slight    History of thrombocytopenia    History of TIAs    Hyperlipidemia    Hypertension    Irritable bowel syndrome 11/17/2016   rec trial of citrucel/ diet 11/16/2016 >>>      Local edema 02/07/2018   Malaise and fatigue 12/08/2020   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 [place]. And Dr. 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 Renal parapelvic cysts 10/06/16 on CT , right kidney midpole 1.2 cm, right kidney anterior pole 1.1 cm, and to the left kidney lower pole anterior position 3.5cm all felt to be benign   Right bundle branch block 10/20/2020   Right lower lobe pulmonary nodule 11/09/2017   Shingles    internal- obstruction of bowels & gallbladder   Solitary pulmonary nodule 11/16/2016   CT ABd 03/24/09 linerar densities in RLL and RML c/w scarring o/w clear CT Abd 10/20/16 c/w 7 mm nodule  -  CT chest 02/13/2017 :  slt enlarged to 9 mm   - PET 04/04/17 :1. These slowly enlarging 9 mm right lower lobe pulmonary nodule along the right hemidiaphragm does not appear hypermetabolic today. Location adjacent to the hemidiaphragm can cause false negatives due to motion artifact related to the   Stage 3a chronic kidney disease ([place]) 06/17/2020   Stroke (HCC)    Swelling of limb-Left foot 01/01/2015   Vitamin B12 deficiency 06/21/2017    Medications:  Scheduled:   colesevelam  1,250 mg Oral BID WC   furosemide  40 mg Intravenous Daily   losartan  50 mg Oral Daily   rosuvastatin  40 mg Oral QHS    Assessment: 73 yom presenting with elevated BP, worsening swelling, and persistent SOB - found to be in Aflutter. No AC PTA.    Plan for apixaban originally but given low HR and possible need to TEE/DCCV in future, will do heparin first. Never received initial dose of apixaban.   Goal of Therapy:  Heparin level 0.3-0.7 units/ml Monitor platelets by anticoagulation protocol: Yes   Plan:  Give 4000 units bolus x 1 Start heparin infusion at 1200 units/hr Check anti-Xa level in 8 hours and daily while on heparin Continue to monitor H&H and platelets  Antonietta Jewel, PharmD, [place] Pharmacist  Phone: [phone] 03/19/2021 9:01 AM  Please check AMION for all [place] phone numbers After 10:00 PM, call [place] [phone]  ADDENDUM Discussed with cardiology and plan to go back to apixaban. Given age >57 but Scr <1.5 and wt>60 kg - qualifies for 5 mg BID.   Antonietta Jewel, PharmD, [place] Clinical Pharmacist

## 2021-03-19 NOTE — TOC Benefit Eligibility Note (Signed)
Patient Teacher, English as a foreign language completed.    The patient is currently admitted and upon discharge could be taking Eliquis 5 mg.  The current 30 day co-pay is, $47.00.   The patient is insured through [place] Patient Advocate Specialist [place] [employer] Direct Number: [phone]  Fax: [phone]

## 2021-03-19 NOTE — Progress Notes (Addendum)
   Patient seen and examined at bedside, patient admitted after midnight, please see earlier detailed admission note by Shela Leff, MD. Briefly, patient presented secondary to dyspnea on exertion in setting of new onset atrial fibrillation and history of bradycardia.  Subjective: Feeling fine. Walked overnight without symptoms. No chest pain or dyspnea currently. No palpitations.  BP (!) 147/80 (BP Location: Right Arm)   Pulse (!) 51   Temp 98 F (36.7 C)   Resp 16   Ht 5\' 8"  (1.727 m)   Wt 83.8 kg Comment: scale b  SpO2 97%   BMI 28.08 kg/m   General exam: Appears calm and comfortable Respiratory system: Clear to auscultation. Respiratory effort normal. Cardiovascular system: Normal S1. Fixed/split S2. Regular rhythm. Slightly slow rate. No murmurs, rubs, gallops or clicks. Gastrointestinal system: Abdomen is slightly distended, soft and nontender. No organomegaly or masses felt. Normal bowel sounds heard. Central nervous system: Alert and oriented. No focal neurological deficits. Musculoskeletal: 2+ BLE edema. No calf tenderness Skin: No cyanosis. No rashes Psychiatry: Judgement and insight appear normal. Mood & affect appropriate.   Brief assessment/Plan:  Atrial flutter with slow ventricular rate New onset atrial fibrillation. CHA2DS2-VASc Score is 6. Cardiology consulted on admission and are recommending Eliquis, Transthoracic Echocardiogram, Transesophageal Echocardiogram/DCCV; patient has been made NPO. -Cardiology recommendations: pending today  Bilateral LE edema Chronic. Recent Transthoracic Echocardiogram with normal EF from 03/2020. Given Lasix IV in the ED and transitioned to Lasix PO on admission. Low concern for DVT. -Continue Lasix per cardiology recommendations  PAD Patient is on Aspirin and Plavix as an outpatient which have been stopped by cardiology in favor of Eliquis for new onset atrial fibrillation.  Primary hypertension -Continue  Losartan  Hyperlipidemia -Continue Crestor  Family communication: None at bedside DVT prophylaxis: Eliquis Disposition: Discharge likely home in several days per cardiology recommendations  Discussed with cardiology service. Appropriate for cardiology service at this time. TRH will sign off. Please re-consult for medical questions.  Cordelia Poche, MD [facility] 03/19/2021, 7:16 AM

## 2021-03-19 NOTE — Evaluation (Signed)
Physical Therapy Evaluation Patient Details Name: Louis Barton MRN: [id number] DOB: September 11, 1941 Today's Date: 03/19/2021   History of Present Illness  Louis Barton is a 80 y.o. male with medical history significant of CAD, PVD, first and second-degree AV block, hypertension, hyperlipidemia, history of lung cancer status post resection, AAA, carotid stenosis, prediabetes, CVA presented to the ED with complaints of dyspnea with exertion, bilateral leg swelling, and elevated blood pressure.  Clinical Impression  Patient presents with mobility close to functional baseline.  SOB slightly with ambulation, but SpO2 >90% throughout on RA.  Feel stable for home when medically ready without PT follow up.  Patient can ambulate with nursing.  No further skilled PT needs at this time.    Follow Up Recommendations No PT follow up    Equipment Recommendations  None recommended by PT    Recommendations for Other Services       Precautions / Restrictions Precautions Precautions: Fall      Mobility  Bed Mobility Overal bed mobility: Modified Independent                  Transfers Overall transfer level: Modified independent                  Ambulation/Gait Ambulation/Gait assistance: Independent   Assistive device: None Gait Pattern/deviations: Decreased stride length;Shuffle     General Gait Details: shuffles at times, ambulating on RA with SpO2 94%, HR 64  Stairs            Wheelchair Mobility    Modified Rankin (Stroke Patients Only)       Balance Overall balance assessment: No apparent balance deficits (not formally assessed)                                           Pertinent Vitals/Pain Pain Assessment: No/denies pain    Home Living Family/patient expects to be discharged to:: Private residence Living Arrangements: Spouse/significant other Available Help at Discharge: Family;Available 24 hours/day Type of Home: House Home  Access: Level entry;Stairs to enter   Entrance Stairs-Number of Steps: 1 at front, level at side Home Layout: One level Home Equipment: Cane - single point;Walker - 2 wheels;Shower seat      Prior Function Level of Independence: Independent               Hand Dominance   Dominant Hand: Right    Extremity/Trunk Assessment        Lower Extremity Assessment Lower Extremity Assessment: Overall WFL for tasks assessed (with B LE edema)       Communication   Communication: No difficulties  Cognition Arousal/Alertness: Awake/alert Behavior During Therapy: WFL for tasks assessed/performed Overall Cognitive Status: Within Functional Limits for tasks assessed                                        General Comments General comments (skin integrity, edema, etc.): toileted independent in bathroom    Exercises     Assessment/Plan    PT Assessment Patent does not need any further PT services  PT Problem List         PT Treatment Interventions      PT Goals (Current goals can be found in the Care Plan section)  Acute Rehab PT Goals PT  Goal Formulation: All assessment and education complete, DC therapy    Frequency     Barriers to discharge        Co-evaluation               AM-PAC PT "6 Clicks" Mobility  Outcome Measure Help needed turning from your back to your side while in a flat bed without using bedrails?: None Help needed moving from lying on your back to sitting on the side of a flat bed without using bedrails?: None Help needed moving to and from a bed to a chair (including a wheelchair)?: None Help needed standing up from a chair using your arms (e.g., wheelchair or bedside chair)?: None Help needed to walk in hospital room?: None Help needed climbing 3-5 steps with a railing? : None 6 Click Score: 24    End of Session   Activity Tolerance: Patient tolerated treatment well Patient left: in bed;with call bell/phone within  reach   PT Visit Diagnosis: Muscle weakness (generalized) (M62.81)    Time: 1191-4782 PT Time Calculation (min) (ACUTE ONLY): 27 min   Charges:   PT Evaluation $PT Eval Low Complexity: 1 Low PT Treatments $Gait Training: 8-22 mins        Magda Kiel, PT Acute Rehabilitation Services Pager:[phone] Office:[phone] 03/19/2021   Reginia Naas 03/19/2021, 12:01 PM

## 2021-03-19 NOTE — Progress Notes (Signed)
IM inquired whether patient is appropriate for cardiology to assume as primary team - Dr. Audie Box agreeable. Changed attending to Dr. Audie Box.

## 2021-03-19 NOTE — H&P (Signed)
History and Physical    Louis Barton GMW:[id number] DOB: 07-Jan-1941 DOA: 03/18/2021  PCP: Raina Mina., MD Patient coming from: Home  Chief Complaint: Dyspnea on exertion  HPI: Louis Barton is a 80 y.o. male with medical history significant of CAD, PVD, first and second-degree AV block, hypertension, hyperlipidemia, history of lung cancer status post resection, AAA, carotid stenosis, prediabetes, CVA presented to the ED with complaints of dyspnea with exertion, bilateral leg swelling, and elevated blood pressure.  Found to be in new onset atrial flutter with slow ventricular rate.  Blood pressure elevated with systolic up to 440H.  Labs showing WBC 6.9, hemoglobin 15.2, platelet count 149K.  Sodium 135, potassium 4.2, chloride 101, bicarb 26, BUN 12, creatinine 0.9, glucose 89.  BNP 187.  Initial high-sensitivity troponin negative, repeat pending.  Age-adjusted D-dimer within normal range.  COVID and influenza PCR negative.  Chest x-ray showing no acute cardiopulmonary process; stable chronic changes of the right lung base. Patient was given IV Lasix 40 mg.  Cardiology consulted.  Patient states he has been feeling weak since he had COVID back in November 2020.  For the past 2 to 3 months he is experiencing dyspnea on exertion.  His legs have been very swollen lately and his physician changed his medications from hydrochlorothiazide to Lasix.  Dose of Lasix was increased last week as he continued to have swelling in his legs.  States sometimes his heart rate drops down to the 30s and his cardiologist told him that he needed a pacemaker.  Denies lightheadedness/dizziness, chest pain, or episodes of syncope. Yesterday when he checked his blood pressure and it was in the 190s.   No other complaints.  Review of Systems:  All systems reviewed and apart from history of presenting illness, are negative.  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 signficant DDD present with vacuum effect L4-5 and L5-S1 flat discs and anterior spurring noted   Dizziness 05/06/2020   Dyslipidemia 03/18/2015   Dyspnea on exertion 04/02/2020   Ear itch 05/14/2018   Edema 11/16/2015   Edema of both lower extremities due to peripheral venous insufficiency 02/03/2020   First degree AV block 10/20/2020   History of hiatal hernia    seen on last scan- as slight    History of thrombocytopenia    History of TIAs    Hyperlipidemia    Hypertension    Irritable bowel syndrome 11/17/2016   rec trial of citrucel/ diet 11/16/2016 >>>      Local edema 02/07/2018   Malaise and fatigue  12/08/2020   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 [place]. And Dr. 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 Renal parapelvic cysts 10/06/16 on CT , right kidney midpole 1.2 cm, right kidney anterior pole 1.1 cm,  and to the left kidney lower pole anterior position 3.5cm all felt to be benign   Right bundle branch block 10/20/2020   Right lower lobe pulmonary nodule 11/09/2017   Shingles    internal- obstruction of bowels & gallbladder   Solitary pulmonary nodule 11/16/2016   CT ABd 03/24/09 linerar densities in RLL and RML c/w scarring o/w clear CT Abd 10/20/16 c/w 7 mm nodule  - CT chest 02/13/2017 :  slt enlarged to 9 mm   - PET 04/04/17 :1. These slowly enlarging 9 mm right lower lobe pulmonary nodule along the right hemidiaphragm does not appear hypermetabolic today. Location adjacent to the hemidiaphragm can cause false negatives due to motion artifact related to the   Stage 3a chronic kidney disease ([place]) 06/17/2020   Stroke ([place])    Swelling of limb-Left foot 01/01/2015   Vitamin B12 deficiency 06/21/2017    Past Surgical History:  Procedure Laterality Date   adenocarcinoma  2019   Removed   CAROTID ENDARTERECTOMY  2005   Left carotid by Dr. Oneida Alar   CAROTID ENDARTERECTOMY  04/25/11   Right Carotid by Dr. Oneida Alar   COLONOSCOPY  02/23/2017   Colonic polyp status post polypectomy. Pancolonic diverticulosis predominatly in the sigmoid colon. Tubular adenoma   LEFT HEART CATH AND CORONARY ANGIOGRAPHY N/A 02/08/2021   Procedure: LEFT HEART CATH AND CORONARY ANGIOGRAPHY;  Surgeon: Burnell Blanks, MD;  Location: [facility];  Service: Cardiovascular;  Laterality: N/A;   Septoplasty, nasal w/ submucosal resection  1960   SQUAMOUS CELL CARCINOMA EXCISION     pt said numerous times   TONSILLECTOMY     VIDEO ASSISTED THORACOSCOPY (VATS)/WEDGE RESECTION Right 11/09/2017   Procedure: RIGHT VIDEO ASSISTED THORACOSCOPY WITH Chales Salmon RESECTION;  Surgeon: [name], MD;  Location: [facility];  Service: Thoracic;  Laterality: Right;     reports that he quit smoking about 48 years ago. His smoking use included cigarettes. He has a 16.00 pack-year smoking history. He has never used smokeless  tobacco. He reports previous alcohol use of about 2.0 standard drinks of alcohol per week. He reports that he does not use drugs.  Not on File  Family History  Problem Relation Age of Onset   Cancer Mother 33       Breast   Stroke Mother    Diabetes Mother    Hyperlipidemia Mother    Other Mother        varicose veins   Heart attack Mother    Heart disease Mother        Left ankle swelling   Hypertension Mother    Deep vein thrombosis Mother    Varicose Veins Mother    Diabetes Father    Heart attack Father    Other Sister        Tumor in Lung and Brain   Cancer Sister        Tumor   Lung  and  Brain  Cancer Brother        BCC-SCC-Merkle Cell   Colon cancer Neg Hx    Esophageal cancer Neg Hx     Prior to Admission medications   Medication Sig Start Date End Date Taking? Authorizing Provider  acetaminophen (TYLENOL) 500 MG tablet Take 1,000 mg by mouth every 6 (six) hours as needed for mild pain or moderate pain (for pain.).   Yes [provider]  aspirin EC 81 MG tablet Take 81 mg by mouth daily.   Yes [provider]  Cholecalciferol (VITAMIN D3) 50 MCG (2000 UT) TABS Take 2,000 Units by mouth daily.   Yes [provider]  clopidogrel (PLAVIX) 75 MG tablet Take 75 mg by mouth at bedtime.    Yes [provider]  colesevelam (WELCHOL) 625 MG tablet Take 1,250 mg by mouth 2 (two) times daily with a meal.   Yes [provider]  Folic Acid (FOLATE PO) Take 1 tablet by mouth daily with lunch.   Yes [provider]  furosemide (LASIX) 40 MG tablet Take 1 tablet (40 mg total) by mouth daily. 03/16/21 06/14/21 Yes Park Liter, MD  losartan (COZAAR) 50 MG tablet Take 50 mg by mouth daily. 11/24/20  Yes [provider]  Polyethyl Glycol-Propyl Glycol (SYSTANE ULTRA OP) Place 1 drop into both eyes daily. Unknown strength   Yes [provider]  potassium chloride (KLOR-CON) 10 MEQ tablet Take 2 tablets (20 mEq  total) by mouth daily. Patient taking differently: Take 10 mEq by mouth 2 (two) times daily. 01/27/21 04/27/21 Yes Park Liter, MD  rosuvastatin (CRESTOR) 40 MG tablet Take 1 tablet (40 mg total) by mouth daily. Patient taking differently: Take 40 mg by mouth at bedtime. 04/14/11  Yes Wellington Hampshire, MD  valACYclovir (VALTREX) 1000 MG tablet Take 1,000 mg by mouth 2 (two) times daily as needed (for fever blisters.).  05/19/17  Yes [provider]    Physical Exam: Vitals:   03/19/21 0045 03/19/21 0130 03/19/21 0202 03/19/21 0211  BP: (!) 191/74 (!) 163/59 (!) 155/74   Pulse: (!) 40 (!) 40 91   Resp: 15 17 20    Temp:  97.7 F (36.5 C) 98.2 F (36.8 C)   TempSrc:  Oral Oral   SpO2: 96% 99% 96%   Weight:    83.8 kg  Height:    5\' 8"  (1.727 m)    Physical Exam Constitutional:      General: He is not in acute distress. HENT:     Head: Normocephalic and atraumatic.  Eyes:     Extraocular Movements: Extraocular movements intact.     Conjunctiva/sclera: Conjunctivae normal.  Cardiovascular:     Rate and Rhythm: Normal rate and regular rhythm.     Pulses: Normal pulses.  Pulmonary:     Effort: Pulmonary effort is normal. No respiratory distress.     Breath sounds: Normal breath sounds. No wheezing or rales.  Abdominal:     General: Bowel sounds are normal. There is no distension.     Palpations: Abdomen is soft.     Tenderness: There is no abdominal tenderness.  Musculoskeletal:     Cervical back: Normal range of motion and neck supple.     Comments: +4 pitting edema of bilateral lower extremities  Skin:    General: Skin is warm and dry.  Neurological:     General: No focal deficit present.     Mental Status: He is alert and oriented to person,  place, and time.     Labs on Admission: I have personally reviewed following labs and imaging studies  CBC: Recent Labs  Lab 03/18/21 1816  WBC 6.9  NEUTROABS 3.8  HGB 15.2  HCT 47.4  MCV 95.4  PLT 149*    Basic Metabolic Panel: Recent Labs  Lab 03/18/21 1816  NA 135  K 4.2  CL 101  CO2 26  GLUCOSE 89  BUN 12  CREATININE 0.91  CALCIUM 8.7*   GFR: Estimated Creatinine Clearance: 66 mL/min (by C-G formula based on SCr of 0.91 mg/dL). Liver Function Tests: Recent Labs  Lab 03/18/21 1816  AST 21  ALT 17  ALKPHOS 44  BILITOT 0.8  PROT 6.4*  ALBUMIN 3.6   No results for input(s): LIPASE, AMYLASE in the last 168 hours. No results for input(s): AMMONIA in the last 168 hours. Coagulation Profile: No results for input(s): INR, PROTIME in the last 168 hours. Cardiac Enzymes: No results for input(s): CKTOTAL, CKMB, CKMBINDEX, TROPONINI in the last 168 hours. BNP (last 3 results) Recent Labs    04/02/20 0959  PROBNP 327   HbA1C: No results for input(s): HGBA1C in the last 72 hours. CBG: No results for input(s): GLUCAP in the last 168 hours. Lipid Profile: No results for input(s): CHOL, HDL, LDLCALC, TRIG, CHOLHDL, LDLDIRECT in the last 72 hours. Thyroid Function Tests: No results for input(s): TSH, T4TOTAL, FREET4, T3FREE, THYROIDAB in the last 72 hours. Anemia Panel: No results for input(s): VITAMINB12, FOLATE, FERRITIN, TIBC, IRON, RETICCTPCT in the last 72 hours. Urine analysis:    Component Value Date/Time   COLORURINE YELLOW 11/07/2017 [place] 11/07/2017 1233   LABSPEC 1.018 11/07/2017 1233   PHURINE 6.0 11/07/2017 1233   GLUCOSEU NEGATIVE 11/07/2017 1233   HGBUR NEGATIVE 11/07/2017 1233   BILIRUBINUR NEGATIVE 11/07/2017 1233   KETONESUR NEGATIVE 11/07/2017 1233   PROTEINUR NEGATIVE 11/07/2017 1233   UROBILINOGEN 1.0 04/21/2011 0834   NITRITE NEGATIVE 11/07/2017 1233   LEUKOCYTESUR NEGATIVE 11/07/2017 1233    Radiological Exams on Admission: DG Chest Portable 1 View  Result Date: 03/18/2021 CLINICAL DATA:  80 year old male with shortness of breath. EXAM: PORTABLE CHEST 1 VIEW COMPARISON:  Chest radiograph dated 01/19/2021. FINDINGS:  Chronic right lung base atelectasis/scarring pleural thickening. The lungs are clear. There is no pneumothorax. Stable cardiomediastinal silhouette. Atherosclerotic calcification of the aorta. No acute osseous pathology. IMPRESSION: 1. No acute cardiopulmonary process. 2. Stable chronic changes of the right lung base. Electronically Signed   By: Anner Crete M.D.   On: 03/18/2021 23:23    EKG: Independently reviewed.  Atrial flutter, RBBB.  Assessment/Plan Principal Problem:   Atrial flutter (HCC) Active Problems:   Hyperlipidemia   Prediabetes   PAD (peripheral artery disease) ([place])   Hypertension   New onset atrial flutter with slow ventricular rate Blood pressure elevated.  CHA2DS2-VASc 6. -Cardiology saw the patient and started Eliquis for anticoagulation with plans for TTE in the morning.  Keep n.p.o. for potential TEE/DCCV.  Bilateral lower extremity edema BNP mildly elevated but chest x-ray without pulmonary edema.  Echo done in July 2021 showing normal EF.  He was given a dose of IV Lasix in the ED. -TTE ordered.  Transition to p.o. Lasix 40 mg daily in the morning.  PVD -Aspirin and Plavix have been stopped by cardiology.  Started on Eliquis due to new onset atrial flutter.  Hypertension Given atrial flutter with slow ventricular rate, cardiology recommending not treating hypertension aggressively. -Continue home losartan.  Hyperlipidemia -Continue Crestor  Prediabetes A1c 5.4 on 12/28/2020.  DVT prophylaxis: Eliquis Code Status: Patient wishes to be full code. Family Communication: No family available at this time. Disposition Plan: Status is: Inpatient  Remains inpatient appropriate because:Ongoing diagnostic testing needed not appropriate for outpatient work up and Inpatient level of care appropriate due to severity of illness  Dispo: The patient is from: Home              Anticipated d/c is to: Home              Patient currently is not medically stable to  d/c.   Difficult to place patient No  Consults called: Cardiology Level of care: Level of care: Telemetry Cardiac  The medical decision making on this patient was of high complexity and the patient is at high risk for clinical deterioration, therefore this is a level 3 visit.  Shela Leff MD [facility]  If 7PM-7AM, please contact night-coverage [email or web address]  03/19/2021, 2:59 AM

## 2021-03-19 NOTE — Plan of Care (Signed)
  Problem: Education: Goal: Knowledge of General Education information will improve Description: Including pain rating scale, medication(s)/side effects and non-pharmacologic comfort measures Outcome: Progressing   Problem: Clinical Measurements: Goal: Ability to maintain clinical measurements within normal limits will improve Outcome: Progressing Goal: Will remain free from infection Outcome: Progressing Goal: Diagnostic test results will improve Outcome: Progressing Goal: Respiratory complications will improve Outcome: Progressing Goal: Cardiovascular complication will be avoided Outcome: Progressing   Problem: Activity: Goal: Risk for activity intolerance will decrease Outcome: Progressing   Problem: Coping: Goal: Level of anxiety will decrease Outcome: Progressing   Problem: Elimination: Goal: Will not experience complications related to bowel motility Outcome: Progressing Goal: Will not experience complications related to urinary retention Outcome: Progressing   Problem: Pain Managment: Goal: General experience of comfort will improve Outcome: Progressing   Problem: Safety: Goal: Ability to remain free from injury will improve Outcome: Progressing   Problem: Education: Goal: Ability to demonstrate management of disease process will improve Outcome: Progressing Goal: Ability to verbalize understanding of medication therapies will improve Outcome: Progressing  Problem: Activity: Goal: Capacity to carry out activities will improve Outcome: Progressing   Problem: Cardiac: Goal: Ability to achieve and maintain adequate cardiopulmonary perfusion will improve Outcome: Progressing   Problem: Education: Goal: Knowledge of disease or condition will improve Outcome: Progressing Goal: Understanding of medication regimen will improve Outcome: Progressing   Problem: Cardiac: Goal: Ability to achieve and maintain adequate cardiopulmonary perfusion will  improve Outcome: Progressing

## 2021-03-19 NOTE — ED Notes (Signed)
RN attempted to call report to [facility]

## 2021-03-19 NOTE — Consult Note (Signed)
Cardiology Consultation:   Patient ID: Louis Barton MRN: [id number]; DOB: 09/14/1941  Admit date: 03/18/2021 Date of Consult: 03/19/2021  PCP:  Raina Mina., MD   [facility] Providers Cardiologist:  Jenne Campus, MD        Patient Profile:   Louis Barton is a 80 y.o. male with a hx of nonobstructive CAD, essential hypertension, BL carotid stenosis s/p BL CEA, NSCLC s/p wedge resection (RLL), and HLD who is being seen 03/19/2021 for the evaluation of Dr. Wyvonnia Dusky at the request of Dr. Wyvonnia Dusky.  History of Present Illness:   [name] tells me that for the last 6 months or so he really has not felt himself.  Prior to [place], he attempted to go to the gym regularly and was able to workout on the elliptical for a good period of time.  Over the last 6 months or so, however, he generally has been too fatigued and short of breath to return to the gym.  He describes a rather insidious onset of dyspnea on exertion that while stable has been persistent over this time period.  Simple things like walking at a brisk pace will tire him out rather quickly.  He attributes some of this to "laziness."  Concurrent with this shortness of breath, [name] has also noticed frequent palpitations at night.  He has sense that his heart was out of rhythm for some time.  He denies any dizziness, lightheadedness, or syncope.  Additionally, over the last week or 2 he has also noticed worsening lower extremity swelling.  His left leg is always slightly more swollen than his right, but both have significantly swelled over the last week or 2.  He was switched from hydrochlorothiazide to Lasix, and while he did feel like he was peeing a lot more, the swelling has yet to resolve.  Earlier this week [name] noticed that his blood pressure was elevated into the 190s.  He has consistently had systolic blood pressure readings in the 160s to 190s.  His heart rates have also at times drifted down into the 30s.  He and his  family were concerned with his elevated pressure blood pressures, worsening swelling and persistent shortness of breath, so they decided to come to the emergency department for evaluation this evening.  On arrival to the emergency department, [name] was noted to be in atrial flutter with variable but slow ventricular response.  His heart rates have ranged from the 30s to the 76H, with systolic blood pressures to the 190s.  His BNP was slightly elevated.  A D-dimer was slightly elevated but adjusted for age was within the normal range.   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 On imaging;   Cramps of left lower  extremity-Calf  > right calf 01/01/2015   Degeneration of lumbar intervertebral disc 11/16/2015   Formatting of this note might be different from the original. 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 [place]. And Dr. 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 These slowly enlarging 9 mm right lower lobe pulmonary nodule along the right hemidiaphragm does not appear hypermetabolic today. Location adjacent to the hemidiaphragm can cause false negatives due to motion artifact related to the   Stage 3a chronic kidney disease ([place]) 06/17/2020   Stroke ([place])    Swelling of limb-Left foot 01/01/2015   Vitamin B12 deficiency 06/21/2017    Past Surgical History:  Procedure Laterality Date   adenocarcinoma  2019   Removed   CAROTID ENDARTERECTOMY  2005   Left carotid by Dr. Oneida Alar   CAROTID ENDARTERECTOMY  04/25/11   Right Carotid by Dr. Oneida Alar   COLONOSCOPY  02/23/2017   Colonic polyp status post polypectomy. Pancolonic diverticulosis predominatly in the sigmoid colon. Tubular adenoma   LEFT HEART CATH AND CORONARY ANGIOGRAPHY N/A 02/08/2021   Procedure: LEFT HEART CATH AND CORONARY ANGIOGRAPHY;   Surgeon: Burnell Blanks, MD;  Location: [facility];  Service: Cardiovascular;  Laterality: N/A;   Septoplasty, nasal w/ submucosal resection  1960   SQUAMOUS CELL CARCINOMA EXCISION     pt said numerous times   TONSILLECTOMY     VIDEO ASSISTED THORACOSCOPY (VATS)/WEDGE RESECTION Right 11/09/2017   Procedure: RIGHT VIDEO ASSISTED THORACOSCOPY WITH Chales Salmon RESECTION;  Surgeon: [name], MD;  Location: [facility];  Service: Thoracic;  Laterality: Right;     Home Medications:  Prior to Admission medications   Medication Sig Start Date End Date Taking? Authorizing Provider  acetaminophen (TYLENOL) 500 MG tablet Take 1,000 mg by mouth every 6 (six) hours as needed for mild pain or  moderate pain (for pain.).   Yes [provider]  aspirin EC 81 MG tablet Take 81 mg by mouth daily.   Yes [provider]  Cholecalciferol (VITAMIN D3) 50 MCG (2000 UT) TABS Take 2,000 Units by mouth daily.   Yes [provider]  clopidogrel (PLAVIX) 75 MG tablet Take 75 mg by mouth at bedtime.    Yes [provider]  colesevelam (WELCHOL) 625 MG tablet Take 1,250 mg by mouth 2 (two) times daily with a meal.   Yes [provider]  Folic Acid (FOLATE PO) Take 1 tablet by mouth daily with lunch.   Yes [provider]  furosemide (LASIX) 40 MG tablet Take 1 tablet (40 mg total) by mouth daily. 03/16/21 06/14/21 Yes Park Liter, MD  losartan (COZAAR) 50 MG tablet Take 50 mg by mouth daily. 11/24/20  Yes [provider]  Polyethyl Glycol-Propyl Glycol (SYSTANE ULTRA OP) Place 1 drop into both eyes daily. Unknown strength   Yes [provider]  potassium chloride (KLOR-CON) 10 MEQ tablet Take 2 tablets (20 mEq total) by mouth daily. Patient taking differently: Take 10 mEq by mouth 2 (two) times daily. 01/27/21 04/27/21 Yes Park Liter, MD  rosuvastatin (CRESTOR) 40 MG tablet Take 1 tablet (40 mg total) by mouth  daily. Patient taking differently: Take 40 mg by mouth at bedtime. 04/14/11  Yes Wellington Hampshire, MD  valACYclovir (VALTREX) 1000 MG tablet Take 1,000 mg by mouth 2 (two) times daily as needed (for fever blisters.).  05/19/17  Yes [provider]    Inpatient Medications: Scheduled Meds:  Continuous Infusions:  PRN Meds:   Allergies:   No Known Allergies  Social History:   Social History   Socioeconomic History   Marital status: Married    Spouse name: Not on file   Number of children: 2   Years of education: Not on file   Highest education level: Not on file  Occupational History   Not on file  Tobacco Use   Smoking status: Former    Packs/day: 1.00    Years: 16.00    Pack years: 16.00    Types: Cigarettes    Quit date: 09/26/1972    Years since quitting: 48.5   Smokeless tobacco: Never  Vaping Use   Vaping Use: Never used  Substance and Sexual Activity   Alcohol use: Yes    Alcohol/week: 2.0 standard drinks    Types: 2 Glasses of wine per week    Comment:  2-3 Drinks of Scotch per Day   Drug use: No   Sexual activity: Not on file  Other Topics Concern   Not on file  Social History Narrative   Not on file   Social Determinants of Health   Financial Resource Strain: Not on file  Food Insecurity: Not on file  Transportation Needs: Not on file  Physical Activity: Not on file  Stress: Not on file  Social Connections: Not on file  Intimate Partner Violence: Not on file    Family History:    Family History  Problem Relation Age of Onset   Cancer Mother 106       Breast   Stroke Mother    Diabetes Mother    Hyperlipidemia Mother    Other Mother        varicose veins   Heart attack Mother    Heart disease Mother        Left ankle swelling   Hypertension Mother  Deep vein thrombosis Mother    Varicose Veins Mother    Diabetes Father    Heart attack Father    Other Sister        Tumor in Lung and Brain   Cancer Sister        Tumor   Lung   and  Brain   Cancer Brother        BCC-SCC-Merkle Cell   Colon cancer Neg Hx    Esophageal cancer Neg Hx      ROS:  Please see the history of present illness.   All other ROS reviewed and negative.     Physical Exam/Data:   Vitals:   03/18/21 2257 03/19/21 0000 03/19/21 0045 03/19/21 0130  BP: (!) 176/81 (!) 178/69 (!) 191/74 (!) 163/59  Pulse: (!) 47 (!) 36 (!) 40 (!) 40  Resp: 16 18 15 17   Temp:    97.7 F (36.5 C)  TempSrc:    Oral  SpO2: 98% 98% 96% 99%    Intake/Output Summary (Last 24 hours) at 03/19/2021 0144 Last data filed at 03/19/2021 0120 Gross per 24 hour  Intake --  Output 800 ml  Net -800 ml   Last 3 Weights 03/02/2021 02/15/2021 02/08/2021  Weight (lbs) 188 lb 188 lb 12.8 oz 186 lb  Weight (kg) 85.276 kg 85.639 kg 84.369 kg     There is no height or weight on file to calculate BMI.  General:  Well nourished, well developed, in no acute distress HEENT: normal Lymph: no adenopathy Neck: no JVD Endocrine:  No thryomegaly Vascular: No carotid bruits; FA pulses 2+ bilaterally without bruits  Cardiac:  Bradycardic, irregular rhythm, no murmur  Lungs:  clear to auscultation bilaterally, no wheezing, rhonchi or rales  Abd: soft, nontender, no hepatomegaly  Ext: 1+ LE edema (L slightly more than R) Musculoskeletal:  No deformities, BUE and BLE strength normal and equal Skin: warm and dry  Neuro:  CNs 2-12 intact, no focal abnormalities noted Psych:  Normal affect   EKG:  The EKG was personally reviewed and demonstrates:  atrial flutter with variable AV block, RBBB  Relevant CV Studies: LHC (01/2021): Mid Cx to Dist Cx lesion is 20% stenosed. Prox LAD to Mid LAD lesion is 20% stenosed.   1. Mild non-obstructive CAD 2. Mild elevation LVEDP  TTE (03/2020):  1. Left ventricular ejection fraction, by estimation, is 60 to 65%. The  left ventricle has normal function. The left ventricle has no regional  wall motion abnormalities. There is mild left  ventricular hypertrophy.  Left ventricular diastolic parameters  were normal.   2. Right ventricular systolic function is normal. The right ventricular  size is normal. Tricuspid regurgitation signal is inadequate for assessing  PA pressure.   3. The mitral valve is normal in structure. Trivial mitral valve  regurgitation.   4. The aortic valve was not well visualized. Aortic valve regurgitation  is not visualized. No aortic stenosis is present.   5. Aortic dilatation noted. There is mild dilatation of the ascending  aorta measuring 38 mm.   6. The inferior vena cava is normal in size with greater than 50%  respiratory variability, suggesting right atrial pressure of 3 mmHg.   Laboratory Data:  High Sensitivity Troponin:   Recent Labs  Lab 03/18/21 2259  TROPONINIHS 14     Chemistry Recent Labs  Lab 03/18/21 1816  NA 135  K 4.2  CL 101  CO2 26  GLUCOSE 89  BUN 12  CREATININE 0.91  CALCIUM 8.7*  GFRNONAA >60  ANIONGAP 8    Recent Labs  Lab 03/18/21 1816  PROT 6.4*  ALBUMIN 3.6  AST 21  ALT 17  ALKPHOS 44  BILITOT 0.8   Hematology Recent Labs  Lab 03/18/21 1816  WBC 6.9  RBC 4.97  HGB 15.2  HCT 47.4  MCV 95.4  MCH 30.6  MCHC 32.1  RDW 13.8  PLT 149*   BNP Recent Labs  Lab 03/18/21 1816  BNP 187.1*    DDimer  Recent Labs  Lab 03/18/21 2259  DDIMER 0.74*     Radiology/Studies:  DG Chest Portable 1 View  Result Date: 03/18/2021 CLINICAL DATA:  80 year old male with shortness of breath. EXAM: PORTABLE CHEST 1 VIEW COMPARISON:  Chest radiograph dated 01/19/2021. FINDINGS: Chronic right lung base atelectasis/scarring pleural thickening. The lungs are clear. There is no pneumothorax. Stable cardiomediastinal silhouette. Atherosclerotic calcification of the aorta. No acute osseous pathology. IMPRESSION: 1. No acute cardiopulmonary process. 2. Stable chronic changes of the right lung base. Electronically Signed   By: Anner Crete M.D.   On:  03/18/2021 23:23     Assessment and Plan:   Mr. Plog presents this evening with persistent SOB, LE edema, and fatigue and was found to have new onset atrial flutter with slow ventricular rates.   Mr. Moncada symptoms are likely multifactorial. While his LV systolic function was previously normal (and I suspect remains), I do not think he tolerates this degree of AV dyssynchrony very well. He also had significant baseline conduction disease which may have progressed. 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 He has some LE edema but his filling pressures on my exam do not appear markedly elevated. He is significantly bradycardic but with quite elevated blood pressures.   He has already been given IV lasix and has had robust UOP with this. I think we can transition him back to his PO diuretics tomorrow. He will remain hypertensive as long as he is this bradycardic, so I would not aggressively treat. Rather I would just continue his home hypertensive regimen. Once he is back in sinus rhythm, we may need to uptitrate. For his new atrial flutter, I would start oral anticoagulation this evening. 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 I think a reasonable first step would be electrical cardioversion to restore sinus rhythm (he will need a TEE prior to). Once he is back in sinus rhythm, I think an assessment of chronotropic competence would be appropriate (eg ETT). 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   Recommendations: - Start eliquis 5 mg BID - Please STOP plavix and aspirin  - TTE in the AM - Please keep NPO for potential TEE/DCCV - Can transition back to PO lasix in the AM  - Cont losartan - Cont crestor/welchol  For questions or updates, please contact [place] Please consult  [email or web address] for contact info under    Signed, Ronaldo Miyamoto, MD  03/19/2021 1:44 AM

## 2021-03-19 NOTE — Progress Notes (Signed)
ED Attempted to give report however, pt just been pended for less than a min on the admission board. Still not approved.

## 2021-03-20 LAB — CBC
HCT: 43.7 % (ref 39.0–52.0)
Hemoglobin: 14.6 g/dL (ref 13.0–17.0)
MCH: 30.8 pg (ref 26.0–34.0)
MCHC: 33.4 g/dL (ref 30.0–36.0)
MCV: 92.2 fL (ref 80.0–100.0)
Platelets: 139 10*3/uL — ABNORMAL LOW (ref 150–400)
RBC: 4.74 MIL/uL (ref 4.22–5.81)
RDW: 13.6 % (ref 11.5–15.5)
WBC: 7.2 10*3/uL (ref 4.0–10.5)
nRBC: 0 % (ref 0.0–0.2)

## 2021-03-20 LAB — MAGNESIUM: Magnesium: 1.8 mg/dL (ref 1.7–2.4)

## 2021-03-20 LAB — BASIC METABOLIC PANEL
Anion gap: 6 (ref 5–15)
BUN: 14 mg/dL (ref 8–23)
CO2: 26 mmol/L (ref 22–32)
Calcium: 8.6 mg/dL — ABNORMAL LOW (ref 8.9–10.3)
Chloride: 106 mmol/L (ref 98–111)
Creatinine, Ser: 0.97 mg/dL (ref 0.61–1.24)
GFR, Estimated: >60 mL/min (ref >60)
Glucose, Bld: 97 mg/dL (ref 70–99)
Potassium: 3.5 mmol/L (ref 3.5–5.1)
Sodium: 138 mmol/L (ref 135–145)

## 2021-03-20 NOTE — Evaluation (Signed)
Occupational Therapy Evaluation Patient Details Name: Louis Barton MRN: [id number] DOB: 1941-06-26 Today's Date: 03/20/2021    History of Present Illness Louis Barton is a 80 y.o. male with medical history significant of CAD, PVD, first and second-degree AV block, hypertension, hyperlipidemia, history of lung cancer status post resection, AAA, carotid stenosis, prediabetes, CVA presented to the ED with complaints of dyspnea with exertion, bilateral leg swelling, and elevated blood pressure.   Clinical Impression    Pt. Is Mod I with ADLs and transfers. Pt. Balance was intact during session. Pt. Stated he was concerned about fall when transferring into tub. Discussed with pt. A tub transfer bench and how to preform transfer. Ed pt. On [employer] organization that can install grab bars in home.   Follow Up Recommendations  No OT follow up    Equipment Recommendations  None recommended by OT    Recommendations for Other Services       Precautions / Restrictions Precautions Precautions: Fall Restrictions Weight Bearing Restrictions: No      Mobility Bed Mobility Overal bed mobility: Modified Independent                  Transfers Overall transfer level: Modified independent                    Balance                                           ADL either performed or assessed with clinical judgement   ADL                                               Vision Baseline Vision/History: Wears glasses Wears Glasses: At all times Patient Visual Report: No change from baseline       Perception     Praxis      Pertinent Vitals/Pain Pain Assessment: No/denies pain     Hand Dominance Right   Extremity/Trunk Assessment     Lower Extremity Assessment Lower Extremity Assessment: Defer to PT evaluation   Cervical / Trunk Assessment Cervical / Trunk Assessment: Normal   Communication Communication Communication: No  difficulties   Cognition Arousal/Alertness: Awake/alert Behavior During Therapy: WFL for tasks assessed/performed Overall Cognitive Status: Within Functional Limits for tasks assessed                                     General Comments       Exercises     Shoulder Instructions      Home Living Family/patient expects to be discharged to:: Private residence Living Arrangements: Spouse/significant other Available Help at Discharge: Family;Available 24 hours/day Type of Home: House Home Access: Level entry;Stairs to enter Entrance Stairs-Number of Steps: 1 at front, level at side   Home Layout: One level     Bathroom Shower/Tub: Teacher, early years/pre: Standard     Home Equipment: Cane - single point;Walker - 2 wheels;Shower seat   Additional Comments: Pt. states he does not use any dme      Prior Functioning/Environment Level of Independence: Independent  OT Problem List:        OT Treatment/Interventions:      OT Goals(Current goals can be found in the care plan section) Acute Rehab OT Goals Patient Stated Goal: go home  OT Frequency:     Barriers to D/C:            Co-evaluation              AM-PAC OT "6 Clicks" Daily Activity     Outcome Measure Help from another person eating meals?: None Help from another person taking care of personal grooming?: None Help from another person toileting, which includes using toliet, bedpan, or urinal?: None Help from another person bathing (including washing, rinsing, drying)?: None Help from another person to put on and taking off regular upper body clothing?: None Help from another person to put on and taking off regular lower body clothing?: None 6 Click Score: 24   End of Session Nurse Communication:  (ok therapy)  Activity Tolerance: Patient tolerated treatment well Patient left: in bed;with call bell/phone within reach                   Time:  1030-1100 OT Time Calculation (min): 30 min Charges:  OT General Charges $OT Visit: 1 Visit OT Evaluation $OT Eval Low Complexity: 1 Low  03/20/2021 Louis Barton OT/L    Louis Barton 03/20/2021, 11:12 AM

## 2021-03-20 NOTE — Progress Notes (Signed)
Cardiology Progress Note  Patient ID: Louis Barton MRN: [id number] DOB: [birth year] Date of Encounter: 03/20/2021  Primary Cardiologist: Jenne Campus, MD  Subjective   Chief Complaint: Fatigue.  HPI: Reports he is still weak and fatigued.  Breathing has improved but not back to baseline.  Good diuresis yesterday.  Appears euvolemic.  Remains in atrial flutter.  ROS:  All other ROS reviewed and negative. Pertinent positives noted in the HPI.     Inpatient Medications  Scheduled Meds:  apixaban  5 mg Oral BID   colesevelam  1,250 mg Oral BID WC   losartan  50 mg Oral Daily   rosuvastatin  40 mg Oral QHS   Continuous Infusions:  PRN Meds: acetaminophen **OR** acetaminophen   Vital Signs   Vitals:   03/20/21 0006 03/20/21 0303 03/20/21 0427 03/20/21 0752  BP: 130/67  134/80 137/76  Pulse: (!) 54  (!) 52 78  Resp: 18  18 18   Temp: 97.8 F (36.6 C)  97.8 F (36.6 C) 97.7 F (36.5 C)  TempSrc: Oral  Oral Oral  SpO2: 97%  98% 97%  Weight:  81.6 kg    Height:        Intake/Output Summary (Last 24 hours) at 03/20/2021 0915 Last data filed at 03/20/2021 4481 Gross per 24 hour  Intake 570 ml  Output 1700 ml  Net -1130 ml   Last 3 Weights 03/20/2021 03/19/2021 03/02/2021  Weight (lbs) 179 lb 14.4 oz 184 lb 11.2 oz 188 lb  Weight (kg) 81.602 kg 83.779 kg 85.276 kg      Telemetry  Overnight telemetry shows atrial flutter with heart rates in the 70s, which I personally reviewed.   Physical Exam   Vitals:   03/20/21 0006 03/20/21 0303 03/20/21 0427 03/20/21 0752  BP: 130/67  134/80 137/76  Pulse: (!) 54  (!) 52 78  Resp: 18  18 18   Temp: 97.8 F (36.6 C)  97.8 F (36.6 C) 97.7 F (36.5 C)  TempSrc: Oral  Oral Oral  SpO2: 97%  98% 97%  Weight:  81.6 kg    Height:        Intake/Output Summary (Last 24 hours) at 03/20/2021 0915 Last data filed at 03/20/2021 0823 Gross per 24 hour  Intake 570 ml  Output 1700 ml  Net -1130 ml    Last 3 Weights 03/20/2021  03/19/2021 03/02/2021  Weight (lbs) 179 lb 14.4 oz 184 lb 11.2 oz 188 lb  Weight (kg) 81.602 kg 83.779 kg 85.276 kg    Body mass index is 27.35 kg/m.  General: Well nourished, well developed, in no acute distress Head: Atraumatic, normal size  Eyes: PEERLA, EOMI  Neck: Supple, no JVD Endocrine: No thryomegaly Cardiac: Normal S1, S2; irregular rhythm, no murmurs Lungs: Clear to auscultation bilaterally, no wheezing, rhonchi or rales  Abd: Soft, nontender, no hepatomegaly  Ext: No edema, pulses 2+ Musculoskeletal: No deformities, BUE and BLE strength normal and equal Skin: Warm and dry, no rashes   Neuro: Alert and oriented to person, place, time, and situation, CNII-XII grossly intact, no focal deficits  Psych: Normal mood and affect   Labs  High Sensitivity Troponin:   Recent Labs  Lab 03/18/21 2259 03/19/21 0128  TROPONINIHS 14 13     Cardiac EnzymesNo results for input(s): TROPONINI in the last 168 hours. No results for input(s): TROPIPOC in the last 168 hours.  Chemistry Recent Labs  Lab 03/18/21 1816 03/20/21 0241  NA 135 138  K 4.2 3.5  CL 101 106  CO2 26 26  GLUCOSE 89 97  BUN 12 14  CREATININE 0.91 0.97  CALCIUM 8.7* 8.6*  PROT 6.4*  --   ALBUMIN 3.6  --   AST 21  --   ALT 17  --   ALKPHOS 44  --   BILITOT 0.8  --   GFRNONAA >60 >60  ANIONGAP 8 6    Hematology Recent Labs  Lab 03/18/21 1816 03/20/21 0241  WBC 6.9 7.2  RBC 4.97 4.74  HGB 15.2 14.6  HCT 47.4 43.7  MCV 95.4 92.2  MCH 30.6 30.8  MCHC 32.1 33.4  RDW 13.8 13.6  PLT 149* 139*   BNP Recent Labs  Lab 03/18/21 1816  BNP 187.1*    DDimer  Recent Labs  Lab 03/18/21 2259  DDIMER 0.74*     Radiology  CT ABDOMEN PELVIS WO CONTRAST  Result Date: 03/19/2021 CLINICAL DATA:  Abdominal distension, lower extremity edema, abnormal lower extremity venous ultrasound question more central clot, history stage III A chronic kidney disease, coronary artery disease, hypertension, hiatal hernia,  former smoker EXAM: CT ABDOMEN AND PELVIS WITHOUT CONTRAST TECHNIQUE: Multidetector CT imaging of the abdomen and pelvis was performed following the standard protocol without IV contrast. Sagittal and coronal MPR images reconstructed from axial data set. COMPARISON:  02/04/2020 FINDINGS: Lower chest: Postsurgical changes RIGHT lower lobe with small RIGHT pleural effusion and RIGHT lower lobe atelectasis versus scarring. Remaining lung bases clear. Hepatobiliary: Gallbladder and liver normal appearance Pancreas: Normal appearance Spleen: Normal appearance Adrenals/Urinary Tract: Adrenal glands normal appearance. BILATERAL renal cysts including peripelvic cysts, largest cyst anterior LEFT kidney 3.9 x 2.7 cm. Ureters and bladder unremarkable. Stomach/Bowel: Normal appendix. Redundant sigmoid colon. Lipoma of the third portion of the duodenum 29 x 10 x 19 mm. Small hiatal hernia. Stomach and bowel loops otherwise normal appearance. Vascular/Lymphatic: Atherosclerotic calcifications involving coronary arteries, aorta, visceral arteries, iliac arteries, common femoral arteries. Aorta normal caliber. No adenopathy. Reproductive: Unremarkable prostate gland and seminal vesicles Other: No free air or free fluid.  No hernia. Musculoskeletal: Scattered degenerative disc disease changes lumbar spine. IMPRESSION: BILATERAL renal cysts. 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 Small hiatal hernia. Stable postsurgical changes and probable atelectasis/scarring at RIGHT lung base. Scattered atherosclerotic calcifications including coronary arteries and visceral arteries. No acute intra-abdominal or intrapelvic abnormalities. Aortic Atherosclerosis (ICD10-I70.0). Electronically Signed   By: Lavonia Dana M.D.   On: 03/19/2021 14:16   DG Chest Portable 1 View  Result Date: 03/18/2021 CLINICAL DATA:  80 year old male with shortness of breath. EXAM: PORTABLE CHEST 1 VIEW COMPARISON:  Chest radiograph dated 01/19/2021. FINDINGS: Chronic  right lung base atelectasis/scarring pleural thickening. The lungs are clear. There is no pneumothorax. Stable cardiomediastinal silhouette. Atherosclerotic calcification of the aorta. No acute osseous pathology. IMPRESSION: 1. No acute cardiopulmonary process. 2. Stable chronic changes of the right lung base. Electronically Signed   By: Anner Crete M.D.   On: 03/18/2021 23:23   ECHOCARDIOGRAM COMPLETE  Result Date: 03/19/2021    ECHOCARDIOGRAM REPORT   Patient Name:   JHONATHAN DESROCHES Date of Exam: 03/19/2021 Medical Rec #:  [id number]     Height:       68.0 in Accession #:    [id number]    Weight:       184.7 lb Date of Birth:  10-Aug-1941      BSA:          1.976 m Patient Age:    28 years  BP:           147/80 mmHg Patient Gender: M             HR:           51 bpm. Exam Location:  Inpatient Procedure: 2D Echo, Cardiac Doppler and Color Doppler Indications:    Atrial flutter  History:        Patient has prior history of Echocardiogram examinations, most                 recent 04/13/2020. Stroke; Risk Factors:Hypertension and                 Dyslipidemia.  Sonographer:    Cammy Brochure Referring Phys: [id number] [place]  1. Left ventricular ejection fraction, by estimation, is 60 to 65%. The left ventricle has normal function. The left ventricle has no regional wall motion abnormalities. There is mild left ventricular hypertrophy. Left ventricular diastolic parameters are indeterminate.  2. Right ventricular systolic function is normal. The right ventricular size is normal.  3. The mitral valve is normal in structure. Mild mitral valve regurgitation. No evidence of mitral stenosis.  4. The aortic valve is calcified. Aortic valve regurgitation is trivial. Mild to moderate aortic valve sclerosis/calcification is present, without any evidence of aortic stenosis.  5. The inferior vena cava is normal in size with greater than 50% respiratory variability, suggesting right atrial pressure of  3 mmHg. FINDINGS  Left Ventricle: Left ventricular ejection fraction, by estimation, is 60 to 65%. The left ventricle has normal function. The left ventricle has no regional wall motion abnormalities. The left ventricular internal cavity size was normal in size. There is  mild left ventricular hypertrophy. Left ventricular diastolic parameters are indeterminate. Right Ventricle: The right ventricular size is normal.Right ventricular systolic function is normal. Left Atrium: Left atrial size was normal in size. Right Atrium: Right atrial size was normal in size. Pericardium: There is no evidence of pericardial effusion. Mitral Valve: The mitral valve is normal in structure. Mild mitral annular calcification. Mild mitral valve regurgitation. No evidence of mitral valve stenosis. Tricuspid Valve: The tricuspid valve is normal in structure. Tricuspid valve regurgitation is trivial. No evidence of tricuspid stenosis. Aortic Valve: The aortic valve is calcified. Aortic valve regurgitation is trivial. Mild to moderate aortic valve sclerosis/calcification is present, without any evidence of aortic stenosis. Aortic valve mean gradient measures 2.7 mmHg. Aortic valve peak  gradient measures 5.1 mmHg. Aortic valve area, by VTI measures 2.24 cm. Pulmonic Valve: The pulmonic valve was grossly normal. Pulmonic valve regurgitation is not visualized. No evidence of pulmonic stenosis. Aorta: The aortic root is normal in size and structure. Venous: The inferior vena cava is normal in size with greater than 50% respiratory variability, suggesting right atrial pressure of 3 mmHg. IAS/Shunts: No atrial level shunt detected by color flow Doppler.  LEFT VENTRICLE PLAX 2D LVIDd:         4.40 cm LVIDs:         3.20 cm LV PW:         1.00 cm LV IVS:        1.00 cm LVOT diam:     2.10 cm LV SV:         48 LV SV Index:   25 LVOT Area:     3.46 cm  RIGHT VENTRICLE          IVC RV Basal diam:  4.10 cm  IVC diam: 1.50 cm  LEFT ATRIUM              Index       RIGHT ATRIUM           Index LA diam:        3.30 cm 1.67 cm/m  RA Area:     20.90 cm LA Vol (A2C):   74.3 ml 37.61 ml/m RA Volume:   54.70 ml  27.69 ml/m LA Vol (A4C):   52.3 ml 26.47 ml/m LA Biplane Vol: 61.8 ml 31.28 ml/m  AORTIC VALVE AV Area (Vmax):    1.97 cm AV Area (Vmean):   1.96 cm AV Area (VTI):     2.24 cm AV Vmax:           112.90 cm/s AV Vmean:          78.033 cm/s AV VTI:            0.217 m AV Peak Grad:      5.1 mmHg AV Mean Grad:      2.7 mmHg LVOT Vmax:         64.15 cm/s LVOT Vmean:        44.050 cm/s LVOT VTI:          0.140 m LVOT/AV VTI ratio: 0.65  AORTA Ao Root diam: 3.40 cm Ao Asc diam:  3.20 cm MITRAL VALVE MV Area (PHT): 4.89 cm    SHUNTS MV Decel Time: 155 msec    Systemic VTI:  0.14 m MV E velocity: 89.30 cm/s  Systemic Diam: 2.10 cm MV A velocity: 44.00 cm/s MV E/A ratio:  2.03 Kirk Ruths MD Electronically signed by Kirk Ruths MD Signature Date/Time: 03/19/2021/2:37:00 PM    Final     Cardiac Studies  TTE 03/19/2021  1. Left ventricular ejection fraction, by estimation, is 60 to 65%. The  left ventricle has normal function. The left ventricle has no regional  wall motion abnormalities. There is mild left ventricular hypertrophy.  Left ventricular diastolic parameters  are indeterminate.   2. Right ventricular systolic function is normal. The right ventricular  size is normal.   3. The mitral valve is normal in structure. Mild mitral valve  regurgitation. No evidence of mitral stenosis.   4. The aortic valve is calcified. Aortic valve regurgitation is trivial.  Mild to moderate aortic valve sclerosis/calcification is present, without  any evidence of aortic stenosis.   5. The inferior vena cava is normal in size with greater than 50%  respiratory variability, suggesting right atrial pressure of 3 mmHg.   LHC 02/08/2021  Mid Cx to Dist Cx lesion is 20% stenosed. Prox LAD to Mid LAD lesion is 20% stenosed.   1. Mild non-obstructive CAD 2.  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  Found to be in atrial flutter with SVR.   Assessment & Plan   New onset atrial flutter with SVR/first-degree AV block/right bundle branch block -Admitted with weakness and fatigue.  A bit volume up.  Was diuresed.  Found to be in atrial flutter with SVR. -Suspect he has underlying significant conduction disease.  He has an intermittent right bundle branch block at times.  Has a first-degree AV block on prior EKGs. -He has  been diuresed and is euvolemic. -Case discussed briefly with the EP.  They have recommended TEE/cardioversion on Monday.  We will proceed with this.  He is on Eliquis 5 mg twice daily. -He is rate controlled.  Rate changed a bit.  I suspect his right bundle branch block is improved.  He has had intermittent right bundle branch block.  We will repeat an EKG today. -Echo shows normal LV function. -He describes symptoms of weakness and fatigue for several weeks.  I would recommend rhythm control strategy with trial of cardioversion.  Plan to do this Monday.  Risk and benefits discussed.  He is willing to proceed. -TSH is normal.  2.  Acute on chronic HFpEF -Admitted with slight volume up.  Received 2 doses of IV Lasix.  Euvolemic today. -Oral diuretics as needed.  3. HTN -Home losartan  4.  Nonobstructive CAD -Continue statin  FEN -No intravenous fluids -Diet: Heart healthy -DVT PPx: Eliquis -Code: Full  For questions or updates, please contact [place] Please consult [email or web address] for contact info under   Time Spent with Patient: I have spent a total of 35 minutes with patient  reviewing hospital notes, telemetry, EKGs, labs and examining the patient as well as establishing an assessment and plan that was discussed with the patient.  > 50% of time was spent in direct patient care.    Signed, Addison Naegeli. Audie Box, MD, [place]  03/20/2021 9:15 AM

## 2021-03-21 LAB — CBC
HCT: 44.3 % (ref 39.0–52.0)
Hemoglobin: 14.5 g/dL (ref 13.0–17.0)
MCH: 30.7 pg (ref 26.0–34.0)
MCHC: 32.7 g/dL (ref 30.0–36.0)
MCV: 93.9 fL (ref 80.0–100.0)
Platelets: 130 10*3/uL — ABNORMAL LOW (ref 150–400)
RBC: 4.72 MIL/uL (ref 4.22–5.81)
RDW: 13.5 % (ref 11.5–15.5)
WBC: 7.9 10*3/uL (ref 4.0–10.5)
nRBC: 0 % (ref 0.0–0.2)

## 2021-03-21 LAB — BASIC METABOLIC PANEL
Anion gap: 8 (ref 5–15)
BUN: 17 mg/dL (ref 8–23)
CO2: 25 mmol/L (ref 22–32)
Calcium: 8.5 mg/dL — ABNORMAL LOW (ref 8.9–10.3)
Chloride: 105 mmol/L (ref 98–111)
Creatinine, Ser: 0.98 mg/dL (ref 0.61–1.24)
GFR, Estimated: >60 mL/min (ref >60)
Glucose, Bld: 95 mg/dL (ref 70–99)
Potassium: 3.5 mmol/L (ref 3.5–5.1)
Sodium: 138 mmol/L (ref 135–145)

## 2021-03-21 MED ORDER — SODIUM CHLORIDE 0.9 % IV SOLN: INTRAVENOUS | Status: DC

## 2021-03-21 NOTE — Progress Notes (Signed)
Cardiology Progress Note  Patient ID: Louis Barton MRN: [id number] DOB: Aug 10, 1941 Date of Encounter: 03/21/2021  Primary Cardiologist: Jenne Campus, MD  Subjective   Chief Complaint: None.  HPI: Remains in atrial flutter.  Heart rate in the 30s overnight.  No symptoms.  Euvolemic.  ROS:  All other ROS reviewed and negative. Pertinent positives noted in the HPI.     Inpatient Medications  Scheduled Meds:  apixaban  5 mg Oral BID   colesevelam  1,250 mg Oral BID WC   losartan  50 mg Oral Daily   rosuvastatin  40 mg Oral QHS   Continuous Infusions:  PRN Meds: acetaminophen **OR** acetaminophen   Vital Signs   Vitals:   03/20/21 1700 03/20/21 2018 03/21/21 0514 03/21/21 0759  BP: (!) 142/80 (!) 141/87 139/81 128/80  Pulse: 73 (!) 44 (!) 54 75  Resp: 17 16 18 16   Temp: 97.8 F (36.6 C) 97.9 F (36.6 C) 98.4 F (36.9 C) 98.2 F (36.8 C)  TempSrc: Oral Oral  Oral  SpO2: 97% 99% 97% 96%  Weight:   82.3 kg   Height:        Intake/Output Summary (Last 24 hours) at 03/21/2021 0926 Last data filed at 03/21/2021 2229 Gross per 24 hour  Intake 600 ml  Output 875 ml  Net -275 ml   Last 3 Weights 03/21/2021 03/20/2021 03/19/2021  Weight (lbs) 181 lb 6.4 oz 179 lb 14.4 oz 184 lb 11.2 oz  Weight (kg) 82.283 kg 81.602 kg 83.779 kg      Telemetry  Overnight telemetry shows atrial flutter heart rate 30-80 bpm, which I personally reviewed.   ECG  The most recent ECG shows atrial flutter, which I personally reviewed.   Physical Exam   Vitals:   03/20/21 1700 03/20/21 2018 03/21/21 0514 03/21/21 0759  BP: (!) 142/80 (!) 141/87 139/81 128/80  Pulse: 73 (!) 44 (!) 54 75  Resp: 17 16 18 16   Temp: 97.8 F (36.6 C) 97.9 F (36.6 C) 98.4 F (36.9 C) 98.2 F (36.8 C)  TempSrc: Oral Oral  Oral  SpO2: 97% 99% 97% 96%  Weight:   82.3 kg   Height:        Intake/Output Summary (Last 24 hours) at 03/21/2021 0926 Last data filed at 03/21/2021 7989 Gross per 24 hour  Intake  600 ml  Output 875 ml  Net -275 ml    Last 3 Weights 03/21/2021 03/20/2021 03/19/2021  Weight (lbs) 181 lb 6.4 oz 179 lb 14.4 oz 184 lb 11.2 oz  Weight (kg) 82.283 kg 81.602 kg 83.779 kg    Body mass index is 27.58 kg/m.  General: Well nourished, well developed, in no acute distress Head: Atraumatic, normal size  Eyes: PEERLA, EOMI  Neck: Supple, no JVD Endocrine: No thryomegaly Cardiac: Normal S1, S2; irregular rhythm Lungs: Clear to auscultation bilaterally, no wheezing, rhonchi or rales  Abd: Soft, nontender, no hepatomegaly  Ext: No edema, pulses 2+ Musculoskeletal: No deformities, BUE and BLE strength normal and equal Skin: Warm and dry, no rashes   Neuro: Alert and oriented to person, place, time, and situation, CNII-XII grossly intact, no focal deficits  Psych: Normal mood and affect   Labs  High Sensitivity Troponin:   Recent Labs  Lab 03/18/21 2259 03/19/21 0128  TROPONINIHS 14 13     Cardiac EnzymesNo results for input(s): TROPONINI in the last 168 hours. No results for input(s): TROPIPOC in the last 168 hours.  Chemistry Recent Labs  Lab  03/18/21 1816 03/20/21 0241 03/21/21 0405  NA 135 138 138  K 4.2 3.5 3.5  CL 101 106 105  CO2 26 26 25   GLUCOSE 89 97 95  BUN 12 14 17   CREATININE 0.91 0.97 0.98  CALCIUM 8.7* 8.6* 8.5*  PROT 6.4*  --   --   ALBUMIN 3.6  --   --   AST 21  --   --   ALT 17  --   --   ALKPHOS 44  --   --   BILITOT 0.8  --   --   GFRNONAA >60 >60 >60  ANIONGAP 8 6 8     Hematology Recent Labs  Lab 03/18/21 1816 03/20/21 0241 03/21/21 0405  WBC 6.9 7.2 7.9  RBC 4.97 4.74 4.72  HGB 15.2 14.6 14.5  HCT 47.4 43.7 44.3  MCV 95.4 92.2 93.9  MCH 30.6 30.8 30.7  MCHC 32.1 33.4 32.7  RDW 13.8 13.6 13.5  PLT 149* 139* 130*   BNP Recent Labs  Lab 03/18/21 1816  BNP 187.1*    DDimer  Recent Labs  Lab 03/18/21 2259  DDIMER 0.74*     Radiology  CT ABDOMEN PELVIS WO CONTRAST  Result Date: 03/19/2021 CLINICAL DATA:  Abdominal  distension, lower extremity edema, abnormal lower extremity venous ultrasound question more central clot, history stage III A chronic kidney disease, coronary artery disease, hypertension, hiatal hernia, former smoker EXAM: CT ABDOMEN AND PELVIS WITHOUT CONTRAST TECHNIQUE: Multidetector CT imaging of the abdomen and pelvis was performed following the standard protocol without IV contrast. Sagittal and coronal MPR images reconstructed from axial data set. COMPARISON:  02/04/2020 FINDINGS: Lower chest: Postsurgical changes RIGHT lower lobe with small RIGHT pleural effusion and RIGHT lower lobe atelectasis versus scarring. Remaining lung bases clear. Hepatobiliary: Gallbladder and liver normal appearance Pancreas: Normal appearance Spleen: Normal appearance Adrenals/Urinary Tract: Adrenal glands normal appearance. BILATERAL renal cysts including peripelvic cysts, largest cyst anterior LEFT kidney 3.9 x 2.7 cm. Ureters and bladder unremarkable. Stomach/Bowel: Normal appendix. Redundant sigmoid colon. Lipoma of the third portion of the duodenum 29 x 10 x 19 mm. Small hiatal hernia. Stomach and bowel loops otherwise normal appearance. Vascular/Lymphatic: Atherosclerotic calcifications involving coronary arteries, aorta, visceral arteries, iliac arteries, common femoral arteries. Aorta normal caliber. No adenopathy. Reproductive: Unremarkable prostate gland and seminal vesicles Other: No free air or free fluid.  No hernia. Musculoskeletal: Scattered degenerative disc disease changes lumbar spine. IMPRESSION: BILATERAL renal cysts. 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 Small hiatal hernia. Stable postsurgical changes and probable atelectasis/scarring at RIGHT lung base. Scattered atherosclerotic calcifications including coronary arteries and visceral arteries. No acute intra-abdominal or intrapelvic abnormalities. Aortic Atherosclerosis (ICD10-I70.0). Electronically Signed   By: Lavonia Dana M.D.   On: 03/19/2021 14:16    ECHOCARDIOGRAM COMPLETE  Result Date: 03/19/2021    ECHOCARDIOGRAM REPORT   Patient Name:   Louis Barton Date of Exam: 03/19/2021 Medical Rec #:  [id number]     Height:       68.0 in Accession #:    [id number]    Weight:       184.7 lb Date of Birth:  11-08-1940      BSA:          1.976 m Patient Age:    61 years      BP:           147/80 mmHg Patient Gender: M             HR:  51 bpm. Exam Location:  Inpatient Procedure: 2D Echo, Cardiac Doppler and Color Doppler Indications:    Atrial flutter  History:        Patient has prior history of Echocardiogram examinations, most                 recent 04/13/2020. Stroke; Risk Factors:Hypertension and                 Dyslipidemia.  Sonographer:    Cammy Brochure Referring Phys: [id number] [place]  1. Left ventricular ejection fraction, by estimation, is 60 to 65%. The left ventricle has normal function. The left ventricle has no regional wall motion abnormalities. There is mild left ventricular hypertrophy. Left ventricular diastolic parameters are indeterminate.  2. Right ventricular systolic function is normal. The right ventricular size is normal.  3. The mitral valve is normal in structure. Mild mitral valve regurgitation. No evidence of mitral stenosis.  4. The aortic valve is calcified. Aortic valve regurgitation is trivial. Mild to moderate aortic valve sclerosis/calcification is present, without any evidence of aortic stenosis.  5. The inferior vena cava is normal in size with greater than 50% respiratory variability, suggesting right atrial pressure of 3 mmHg. FINDINGS  Left Ventricle: Left ventricular ejection fraction, by estimation, is 60 to 65%. The left ventricle has normal function. The left ventricle has no regional wall motion abnormalities. The left ventricular internal cavity size was normal in size. There is  mild left ventricular hypertrophy. Left ventricular diastolic parameters are indeterminate. Right Ventricle: The  right ventricular size is normal.Right ventricular systolic function is normal. Left Atrium: Left atrial size was normal in size. Right Atrium: Right atrial size was normal in size. Pericardium: There is no evidence of pericardial effusion. Mitral Valve: The mitral valve is normal in structure. Mild mitral annular calcification. Mild mitral valve regurgitation. No evidence of mitral valve stenosis. Tricuspid Valve: The tricuspid valve is normal in structure. Tricuspid valve regurgitation is trivial. No evidence of tricuspid stenosis. Aortic Valve: The aortic valve is calcified. Aortic valve regurgitation is trivial. Mild to moderate aortic valve sclerosis/calcification is present, without any evidence of aortic stenosis. Aortic valve mean gradient measures 2.7 mmHg. Aortic valve peak  gradient measures 5.1 mmHg. Aortic valve area, by VTI measures 2.24 cm. Pulmonic Valve: The pulmonic valve was grossly normal. Pulmonic valve regurgitation is not visualized. No evidence of pulmonic stenosis. Aorta: The aortic root is normal in size and structure. Venous: The inferior vena cava is normal in size with greater than 50% respiratory variability, suggesting right atrial pressure of 3 mmHg. IAS/Shunts: No atrial level shunt detected by color flow Doppler.  LEFT VENTRICLE PLAX 2D LVIDd:         4.40 cm LVIDs:         3.20 cm LV PW:         1.00 cm LV IVS:        1.00 cm LVOT diam:     2.10 cm LV SV:         48 LV SV Index:   25 LVOT Area:     3.46 cm  RIGHT VENTRICLE          IVC RV Basal diam:  4.10 cm  IVC diam: 1.50 cm LEFT ATRIUM             Index       RIGHT ATRIUM           Index LA diam:  3.30 cm 1.67 cm/m  RA Area:     20.90 cm LA Vol (A2C):   74.3 ml 37.61 ml/m RA Volume:   54.70 ml  27.69 ml/m LA Vol (A4C):   52.3 ml 26.47 ml/m LA Biplane Vol: 61.8 ml 31.28 ml/m  AORTIC VALVE AV Area (Vmax):    1.97 cm AV Area (Vmean):   1.96 cm AV Area (VTI):     2.24 cm AV Vmax:           112.90 cm/s AV Vmean:           78.033 cm/s AV VTI:            0.217 m AV Peak Grad:      5.1 mmHg AV Mean Grad:      2.7 mmHg LVOT Vmax:         64.15 cm/s LVOT Vmean:        44.050 cm/s LVOT VTI:          0.140 m LVOT/AV VTI ratio: 0.65  AORTA Ao Root diam: 3.40 cm Ao Asc diam:  3.20 cm MITRAL VALVE MV Area (PHT): 4.89 cm    SHUNTS MV Decel Time: 155 msec    Systemic VTI:  0.14 m MV E velocity: 89.30 cm/s  Systemic Diam: 2.10 cm MV A velocity: 44.00 cm/s MV E/A ratio:  2.03 Kirk Ruths MD Electronically signed by Kirk Ruths MD Signature Date/Time: 03/19/2021/2:37:00 PM    Final     Cardiac Studies   TTE 03/19/2021  1. Left ventricular ejection fraction, by estimation, is 60 to 65%. The  left ventricle has normal function. The left ventricle has no regional  wall motion abnormalities. There is mild left ventricular hypertrophy.  Left ventricular diastolic parameters  are indeterminate.   2. Right ventricular systolic function is normal. The right ventricular  size is normal.   3. The mitral valve is normal in structure. Mild mitral valve  regurgitation. No evidence of mitral stenosis.   4. The aortic valve is calcified. Aortic valve regurgitation is trivial.  Mild to moderate aortic valve sclerosis/calcification is present, without  any evidence of aortic stenosis.   5. The inferior vena cava is normal in size with greater than 50%  respiratory variability, suggesting right atrial pressure of 3 mmHg.   LHC 02/08/2021  Mid Cx to Dist Cx lesion is 20% stenosed. Prox LAD to Mid LAD lesion is 20% stenosed.   1. Mild non-obstructive CAD 2. 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  Found to be in atrial flutter with SVR.  Assessment & Plan   New onset atrial flutter with SVR/first-degree AV block/right bundle branch block -Admitted with weakness and fatigue.  Did have a bit of volume on him.  Diuresed.  Found to be in atrial flutter with SVR. -Clearly he has underlying conduction disease.  Case was discussed with the EP on Friday.  I have recommended TEE/cardioversion and proceed with Eliquis.  We will see what his conduction does after cardioversion. -No need for rate control meds.  Avoid AV nodal agents given significant conduction disease. -Continue Eliquis 5 mg twice daily. -N.p.o. at midnight for TEE/cardioversion. -TSH normal. -Echo shows normal LV function.  2.  Acute on chronic HFpEF -Diuresed this admission.  Euvolemic. -Oral diuretics as needed.  3.  Hypertension -Home losartan  4.  Nonobstructive CAD -Statin  FEN -No intravenous fluids -Diet: Heart healthy -DVT PPx: Eliquis -Code: Full -N.p.o. at midnight for TEE/cardioversion tomorrow.  Anticipate discharge tomorrow afternoon if everything goes smoothly. For questions or updates, please contact Cecilia Please consult [email or web address] for contact info under   Time Spent with Patient: I have spent a total of 25 minutes with patient reviewing hospital notes, telemetry, EKGs, labs and examining the patient as well as establishing an assessment and plan that was discussed with the patient.  > 50% of time was spent in direct patient care.    Signed, Addison Naegeli. Audie Box, MD, [place]  03/21/2021 9:26 AM

## 2021-03-21 NOTE — H&P (View-Only) (Signed)
Cardiology Progress Note  Patient ID: Louis Barton MRN: [id number] DOB: 27-Oct-1940 Date of Encounter: 03/21/2021  Primary Cardiologist: Jenne Campus, MD  Subjective   Chief Complaint: None.  HPI: Remains in atrial flutter.  Heart rate in the 30s overnight.  No symptoms.  Euvolemic.  ROS:  All other ROS reviewed and negative. Pertinent positives noted in the HPI.     Inpatient Medications  Scheduled Meds:  apixaban  5 mg Oral BID   colesevelam  1,250 mg Oral BID WC   losartan  50 mg Oral Daily   rosuvastatin  40 mg Oral QHS   Continuous Infusions:  PRN Meds: acetaminophen **OR** acetaminophen   Vital Signs   Vitals:   03/20/21 1700 03/20/21 2018 03/21/21 0514 03/21/21 0759  BP: (!) 142/80 (!) 141/87 139/81 128/80  Pulse: 73 (!) 44 (!) 54 75  Resp: 17 16 18 16   Temp: 97.8 F (36.6 C) 97.9 F (36.6 C) 98.4 F (36.9 C) 98.2 F (36.8 C)  TempSrc: Oral Oral  Oral  SpO2: 97% 99% 97% 96%  Weight:   82.3 kg   Height:        Intake/Output Summary (Last 24 hours) at 03/21/2021 0926 Last data filed at 03/21/2021 4166 Gross per 24 hour  Intake 600 ml  Output 875 ml  Net -275 ml   Last 3 Weights 03/21/2021 03/20/2021 03/19/2021  Weight (lbs) 181 lb 6.4 oz 179 lb 14.4 oz 184 lb 11.2 oz  Weight (kg) 82.283 kg 81.602 kg 83.779 kg      Telemetry  Overnight telemetry shows atrial flutter heart rate 30-80 bpm, which I personally reviewed.   ECG  The most recent ECG shows atrial flutter, which I personally reviewed.   Physical Exam   Vitals:   03/20/21 1700 03/20/21 2018 03/21/21 0514 03/21/21 0759  BP: (!) 142/80 (!) 141/87 139/81 128/80  Pulse: 73 (!) 44 (!) 54 75  Resp: 17 16 18 16   Temp: 97.8 F (36.6 C) 97.9 F (36.6 C) 98.4 F (36.9 C) 98.2 F (36.8 C)  TempSrc: Oral Oral  Oral  SpO2: 97% 99% 97% 96%  Weight:   82.3 kg   Height:        Intake/Output Summary (Last 24 hours) at 03/21/2021 0926 Last data filed at 03/21/2021 0630 Gross per 24 hour  Intake  600 ml  Output 875 ml  Net -275 ml    Last 3 Weights 03/21/2021 03/20/2021 03/19/2021  Weight (lbs) 181 lb 6.4 oz 179 lb 14.4 oz 184 lb 11.2 oz  Weight (kg) 82.283 kg 81.602 kg 83.779 kg    Body mass index is 27.58 kg/m.  General: Well nourished, well developed, in no acute distress Head: Atraumatic, normal size  Eyes: PEERLA, EOMI  Neck: Supple, no JVD Endocrine: No thryomegaly Cardiac: Normal S1, S2; irregular rhythm Lungs: Clear to auscultation bilaterally, no wheezing, rhonchi or rales  Abd: Soft, nontender, no hepatomegaly  Ext: No edema, pulses 2+ Musculoskeletal: No deformities, BUE and BLE strength normal and equal Skin: Warm and dry, no rashes   Neuro: Alert and oriented to person, place, time, and situation, CNII-XII grossly intact, no focal deficits  Psych: Normal mood and affect   Labs  High Sensitivity Troponin:   Recent Labs  Lab 03/18/21 2259 03/19/21 0128  TROPONINIHS 14 13     Cardiac EnzymesNo results for input(s): TROPONINI in the last 168 hours. No results for input(s): TROPIPOC in the last 168 hours.  Chemistry Recent Labs  Lab  03/18/21 1816 03/20/21 0241 03/21/21 0405  NA 135 138 138  K 4.2 3.5 3.5  CL 101 106 105  CO2 26 26 25   GLUCOSE 89 97 95  BUN 12 14 17   CREATININE 0.91 0.97 0.98  CALCIUM 8.7* 8.6* 8.5*  PROT 6.4*  --   --   ALBUMIN 3.6  --   --   AST 21  --   --   ALT 17  --   --   ALKPHOS 44  --   --   BILITOT 0.8  --   --   GFRNONAA >60 >60 >60  ANIONGAP 8 6 8     Hematology Recent Labs  Lab 03/18/21 1816 03/20/21 0241 03/21/21 0405  WBC 6.9 7.2 7.9  RBC 4.97 4.74 4.72  HGB 15.2 14.6 14.5  HCT 47.4 43.7 44.3  MCV 95.4 92.2 93.9  MCH 30.6 30.8 30.7  MCHC 32.1 33.4 32.7  RDW 13.8 13.6 13.5  PLT 149* 139* 130*   BNP Recent Labs  Lab 03/18/21 1816  BNP 187.1*    DDimer  Recent Labs  Lab 03/18/21 2259  DDIMER 0.74*     Radiology  CT ABDOMEN PELVIS WO CONTRAST  Result Date: 03/19/2021 CLINICAL DATA:  Abdominal  distension, lower extremity edema, abnormal lower extremity venous ultrasound question more central clot, history stage III A chronic kidney disease, coronary artery disease, hypertension, hiatal hernia, former smoker EXAM: CT ABDOMEN AND PELVIS WITHOUT CONTRAST TECHNIQUE: Multidetector CT imaging of the abdomen and pelvis was performed following the standard protocol without IV contrast. Sagittal and coronal MPR images reconstructed from axial data set. COMPARISON:  02/04/2020 FINDINGS: Lower chest: Postsurgical changes RIGHT lower lobe with small RIGHT pleural effusion and RIGHT lower lobe atelectasis versus scarring. Remaining lung bases clear. Hepatobiliary: Gallbladder and liver normal appearance Pancreas: Normal appearance Spleen: Normal appearance Adrenals/Urinary Tract: Adrenal glands normal appearance. BILATERAL renal cysts including peripelvic cysts, largest cyst anterior LEFT kidney 3.9 x 2.7 cm. Ureters and bladder unremarkable. Stomach/Bowel: Normal appendix. Redundant sigmoid colon. Lipoma of the third portion of the duodenum 29 x 10 x 19 mm. Small hiatal hernia. Stomach and bowel loops otherwise normal appearance. Vascular/Lymphatic: Atherosclerotic calcifications involving coronary arteries, aorta, visceral arteries, iliac arteries, common femoral arteries. Aorta normal caliber. No adenopathy. Reproductive: Unremarkable prostate gland and seminal vesicles Other: No free air or free fluid.  No hernia. Musculoskeletal: Scattered degenerative disc disease changes lumbar spine. IMPRESSION: BILATERAL renal cysts. 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 Small hiatal hernia. Stable postsurgical changes and probable atelectasis/scarring at RIGHT lung base. Scattered atherosclerotic calcifications including coronary arteries and visceral arteries. No acute intra-abdominal or intrapelvic abnormalities. Aortic Atherosclerosis (ICD10-I70.0). Electronically Signed   By: Lavonia Dana M.D.   On: 03/19/2021 14:16    ECHOCARDIOGRAM COMPLETE  Result Date: 03/19/2021    ECHOCARDIOGRAM REPORT   Patient Name:   Louis Barton Date of Exam: 03/19/2021 Medical Rec #:  [id number]     Height:       68.0 in Accession #:    [id number]    Weight:       184.7 lb Date of Birth:  20-Nov-1940      BSA:          1.976 m Patient Age:    23 years      BP:           147/80 mmHg Patient Gender: M             HR:  51 bpm. Exam Location:  Inpatient Procedure: 2D Echo, Cardiac Doppler and Color Doppler Indications:    Atrial flutter  History:        Patient has prior history of Echocardiogram examinations, most                 recent 04/13/2020. Stroke; Risk Factors:Hypertension and                 Dyslipidemia.  Sonographer:    Cammy Brochure Referring Phys: [id number] [place]  1. Left ventricular ejection fraction, by estimation, is 60 to 65%. The left ventricle has normal function. The left ventricle has no regional wall motion abnormalities. There is mild left ventricular hypertrophy. Left ventricular diastolic parameters are indeterminate.  2. Right ventricular systolic function is normal. The right ventricular size is normal.  3. The mitral valve is normal in structure. Mild mitral valve regurgitation. No evidence of mitral stenosis.  4. The aortic valve is calcified. Aortic valve regurgitation is trivial. Mild to moderate aortic valve sclerosis/calcification is present, without any evidence of aortic stenosis.  5. The inferior vena cava is normal in size with greater than 50% respiratory variability, suggesting right atrial pressure of 3 mmHg. FINDINGS  Left Ventricle: Left ventricular ejection fraction, by estimation, is 60 to 65%. The left ventricle has normal function. The left ventricle has no regional wall motion abnormalities. The left ventricular internal cavity size was normal in size. There is  mild left ventricular hypertrophy. Left ventricular diastolic parameters are indeterminate. Right Ventricle: The  right ventricular size is normal.Right ventricular systolic function is normal. Left Atrium: Left atrial size was normal in size. Right Atrium: Right atrial size was normal in size. Pericardium: There is no evidence of pericardial effusion. Mitral Valve: The mitral valve is normal in structure. Mild mitral annular calcification. Mild mitral valve regurgitation. No evidence of mitral valve stenosis. Tricuspid Valve: The tricuspid valve is normal in structure. Tricuspid valve regurgitation is trivial. No evidence of tricuspid stenosis. Aortic Valve: The aortic valve is calcified. Aortic valve regurgitation is trivial. Mild to moderate aortic valve sclerosis/calcification is present, without any evidence of aortic stenosis. Aortic valve mean gradient measures 2.7 mmHg. Aortic valve peak  gradient measures 5.1 mmHg. Aortic valve area, by VTI measures 2.24 cm. Pulmonic Valve: The pulmonic valve was grossly normal. Pulmonic valve regurgitation is not visualized. No evidence of pulmonic stenosis. Aorta: The aortic root is normal in size and structure. Venous: The inferior vena cava is normal in size with greater than 50% respiratory variability, suggesting right atrial pressure of 3 mmHg. IAS/Shunts: No atrial level shunt detected by color flow Doppler.  LEFT VENTRICLE PLAX 2D LVIDd:         4.40 cm LVIDs:         3.20 cm LV PW:         1.00 cm LV IVS:        1.00 cm LVOT diam:     2.10 cm LV SV:         48 LV SV Index:   25 LVOT Area:     3.46 cm  RIGHT VENTRICLE          IVC RV Basal diam:  4.10 cm  IVC diam: 1.50 cm LEFT ATRIUM             Index       RIGHT ATRIUM           Index LA diam:  3.30 cm 1.67 cm/m  RA Area:     20.90 cm LA Vol (A2C):   74.3 ml 37.61 ml/m RA Volume:   54.70 ml  27.69 ml/m LA Vol (A4C):   52.3 ml 26.47 ml/m LA Biplane Vol: 61.8 ml 31.28 ml/m  AORTIC VALVE AV Area (Vmax):    1.97 cm AV Area (Vmean):   1.96 cm AV Area (VTI):     2.24 cm AV Vmax:           112.90 cm/s AV Vmean:           78.033 cm/s AV VTI:            0.217 m AV Peak Grad:      5.1 mmHg AV Mean Grad:      2.7 mmHg LVOT Vmax:         64.15 cm/s LVOT Vmean:        44.050 cm/s LVOT VTI:          0.140 m LVOT/AV VTI ratio: 0.65  AORTA Ao Root diam: 3.40 cm Ao Asc diam:  3.20 cm MITRAL VALVE MV Area (PHT): 4.89 cm    SHUNTS MV Decel Time: 155 msec    Systemic VTI:  0.14 m MV E velocity: 89.30 cm/s  Systemic Diam: 2.10 cm MV A velocity: 44.00 cm/s MV E/A ratio:  2.03 Kirk Ruths MD Electronically signed by Kirk Ruths MD Signature Date/Time: 03/19/2021/2:37:00 PM    Final     Cardiac Studies   TTE 03/19/2021  1. Left ventricular ejection fraction, by estimation, is 60 to 65%. The  left ventricle has normal function. The left ventricle has no regional  wall motion abnormalities. There is mild left ventricular hypertrophy.  Left ventricular diastolic parameters  are indeterminate.   2. Right ventricular systolic function is normal. The right ventricular  size is normal.   3. The mitral valve is normal in structure. Mild mitral valve  regurgitation. No evidence of mitral stenosis.   4. The aortic valve is calcified. Aortic valve regurgitation is trivial.  Mild to moderate aortic valve sclerosis/calcification is present, without  any evidence of aortic stenosis.   5. The inferior vena cava is normal in size with greater than 50%  respiratory variability, suggesting right atrial pressure of 3 mmHg.   LHC 02/08/2021  Mid Cx to Dist Cx lesion is 20% stenosed. Prox LAD to Mid LAD lesion is 20% stenosed.   1. Mild non-obstructive CAD 2. 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  Found to be in atrial flutter with SVR.  Assessment & Plan   New onset atrial flutter with SVR/first-degree AV block/right bundle branch block -Admitted with weakness and fatigue.  Did have a bit of volume on him.  Diuresed.  Found to be in atrial flutter with SVR. -Clearly he has underlying conduction disease.  Case was discussed with the EP on Friday.  I have recommended TEE/cardioversion and proceed with Eliquis.  We will see what his conduction does after cardioversion. -No need for rate control meds.  Avoid AV nodal agents given significant conduction disease. -Continue Eliquis 5 mg twice daily. -N.p.o. at midnight for TEE/cardioversion. -TSH normal. -Echo shows normal LV function.  2.  Acute on chronic HFpEF -Diuresed this admission.  Euvolemic. -Oral diuretics as needed.  3.  Hypertension -Home losartan  4.  Nonobstructive CAD -Statin  FEN -No intravenous fluids -Diet: Heart healthy -DVT PPx: Eliquis -Code: Full -N.p.o. at midnight for TEE/cardioversion tomorrow.  Anticipate discharge tomorrow afternoon if everything goes smoothly. For questions or updates, please contact [place] Please consult [email or web address] for contact info under   Time Spent with Patient: I have spent a total of 25 minutes with patient reviewing hospital notes, telemetry, EKGs, labs and examining the patient as well as establishing an assessment and plan that was discussed with the patient.  > 50% of time was spent in direct patient care.    Signed, Addison Naegeli. Audie Box, MD, [place]  03/21/2021 9:26 AM

## 2021-03-22 ENCOUNTER — Inpatient Hospital Stay (HOSPITAL_COMMUNITY): Payer: Medicare Other | Admitting: Certified Registered Nurse Anesthetist

## 2021-03-22 ENCOUNTER — Encounter (HOSPITAL_COMMUNITY)
Admission: EM | Disposition: A | Discharge status: Home / Self Care | Payer: Self-pay | Source: Home / Self Care | Attending: Cardiovascular Disease

## 2021-03-22 ENCOUNTER — Other Ambulatory Visit (HOSPITAL_COMMUNITY): Payer: Self-pay

## 2021-03-22 ENCOUNTER — Inpatient Hospital Stay (HOSPITAL_COMMUNITY): Payer: Medicare Other

## 2021-03-22 ENCOUNTER — Encounter (HOSPITAL_COMMUNITY): Payer: Self-pay | Admitting: Internal Medicine

## 2021-03-22 DIAGNOSIS — I2583 Coronary atherosclerosis due to lipid rich plaque: Secondary | ICD-10-CM

## 2021-03-22 DIAGNOSIS — I5033 Acute on chronic diastolic (congestive) heart failure: Secondary | ICD-10-CM

## 2021-03-22 DIAGNOSIS — I2510 Atherosclerotic heart disease of native coronary artery without angina pectoris: Secondary | ICD-10-CM

## 2021-03-22 DIAGNOSIS — I351 Nonrheumatic aortic (valve) insufficiency: Secondary | ICD-10-CM

## 2021-03-22 DIAGNOSIS — E7800 Pure hypercholesterolemia, unspecified: Secondary | ICD-10-CM

## 2021-03-22 DIAGNOSIS — I10 Essential (primary) hypertension: Secondary | ICD-10-CM

## 2021-03-22 DIAGNOSIS — I340 Nonrheumatic mitral (valve) insufficiency: Secondary | ICD-10-CM

## 2021-03-22 HISTORY — PX: TEE WITHOUT CARDIOVERSION: SHX5443

## 2021-03-22 HISTORY — PX: CARDIOVERSION: SHX1299

## 2021-03-22 LAB — BASIC METABOLIC PANEL
Anion gap: 5 (ref 5–15)
BUN: 16 mg/dL (ref 8–23)
CO2: 27 mmol/L (ref 22–32)
Calcium: 8.5 mg/dL — ABNORMAL LOW (ref 8.9–10.3)
Chloride: 107 mmol/L (ref 98–111)
Creatinine, Ser: 1.11 mg/dL (ref 0.61–1.24)
GFR, Estimated: >60 mL/min (ref >60)
Glucose, Bld: 89 mg/dL (ref 70–99)
Potassium: 4 mmol/L (ref 3.5–5.1)
Sodium: 139 mmol/L (ref 135–145)

## 2021-03-22 LAB — CBC
HCT: 43.1 % (ref 39.0–52.0)
Hemoglobin: 14.4 g/dL (ref 13.0–17.0)
MCH: 31.3 pg (ref 26.0–34.0)
MCHC: 33.4 g/dL (ref 30.0–36.0)
MCV: 93.7 fL (ref 80.0–100.0)
Platelets: 129 10*3/uL — ABNORMAL LOW (ref 150–400)
RBC: 4.6 MIL/uL (ref 4.22–5.81)
RDW: 13.5 % (ref 11.5–15.5)
WBC: 6.3 10*3/uL (ref 4.0–10.5)
nRBC: 0 % (ref 0.0–0.2)

## 2021-03-22 SURGERY — ECHOCARDIOGRAM, TRANSESOPHAGEAL
Anesthesia: Monitor Anesthesia Care

## 2021-03-22 MED ORDER — ROSUVASTATIN CALCIUM 40 MG PO TABS: 40.0000 mg | ORAL_TABLET | Freq: Every day | ORAL | Status: DC

## 2021-03-22 MED ORDER — LIDOCAINE 2% (20 MG/ML) 5 ML SYRINGE
INTRAMUSCULAR | Status: DC | PRN
  Administered 2021-03-22: 40 mg via INTRAVENOUS

## 2021-03-22 MED ORDER — EPHEDRINE SULFATE-NACL 50-0.9 MG/10ML-% IV SOSY
PREFILLED_SYRINGE | INTRAVENOUS | Status: DC | PRN
Start: 2021-03-22 — End: 2021-03-22
  Administered 2021-03-22: 5 mg via INTRAVENOUS

## 2021-03-22 MED ORDER — FUROSEMIDE 40 MG PO TABS
40.0000 mg | ORAL_TABLET | Freq: Every day | ORAL | 3 refills | Status: DC | PRN
  Filled 2021-03-22: qty 90, 90d supply, fill #0

## 2021-03-22 MED ORDER — APIXABAN 5 MG PO TABS
5.0000 mg | ORAL_TABLET | Freq: Two times a day (BID) | ORAL | 3 refills | Status: DC
  Filled 2021-03-22: qty 60, 30d supply, fill #0

## 2021-03-22 MED ORDER — PROPOFOL 500 MG/50ML IV EMUL
INTRAVENOUS | Status: DC | PRN
  Administered 2021-03-22: 100 ug/kg/min via INTRAVENOUS

## 2021-03-22 MED ORDER — POTASSIUM CHLORIDE ER 10 MEQ PO TBCR: 20.0000 meq | EXTENDED_RELEASE_TABLET | Freq: Every day | ORAL | 3 refills | Status: DC | PRN

## 2021-03-22 MED ORDER — SODIUM CHLORIDE 0.9 % IV SOLN: INTRAVENOUS | Status: DC | PRN

## 2021-03-22 MED ORDER — BUTAMBEN-TETRACAINE-BENZOCAINE 2-2-14 % EX AERO
INHALATION_SPRAY | CUTANEOUS | Status: DC | PRN
  Administered 2021-03-22: 2 via TOPICAL

## 2021-03-22 MED ORDER — PROPOFOL 10 MG/ML IV BOLUS
INTRAVENOUS | Status: DC | PRN
  Administered 2021-03-22 (×2): 10 mg via INTRAVENOUS

## 2021-03-22 NOTE — Progress Notes (Signed)
    Transesophageal Echocardiogram Note  JURGEN GROENEVELD [id number] November 10, 1940  Procedure: Transesophageal Echocardiogram Indications: Atrial flutter  Procedure Details Consent: Obtained Time Out: Verified patient identification, verified procedure, site/side was marked, verified correct patient position, special equipment/implants available, Radiology Safety Procedures followed,  medications/allergies/relevent history reviewed, required imaging and test results available.  Performed  Medications:  Pt sedated by anesthesia with lidocaine 40 mg and diprovan 156 mg IV total.  Normal LV function; mild biatrial enlargement; mild AI, MR and TR; no LAA thrombus.  Pt subsequently underwent DCCV with 120J to sinus bradycardia with no complications; continue apixaban.    Complications: No apparent complications Patient did tolerate procedure well.  Kirk Ruths, MD

## 2021-03-22 NOTE — Transfer of Care (Signed)
Immediate Anesthesia Transfer of Care Note  Patient: Louis Barton  Procedure(s) Performed: TRANSESOPHAGEAL ECHOCARDIOGRAM (TEE) CARDIOVERSION  Patient Location: Endoscopy Unit  Anesthesia Type:MAC  Level of Consciousness: drowsy and patient cooperative  Airway & Oxygen Therapy: Patient Spontanous Breathing and Patient connected to face mask oxygen  Post-op Assessment: Report given to RN and Post -op Vital signs reviewed and stable  Post vital signs: Reviewed  Last Vitals:  Vitals Value Taken Time  BP 123/59 03/22/21 0806  Temp    Pulse 58 03/22/21 0807  Resp 20 03/22/21 0807  SpO2 96 % 03/22/21 0807  Vitals shown include unvalidated device data.  Last Pain:  Vitals:   03/22/21 0700  TempSrc: Temporal  PainSc: 0-No pain         Complications: No notable events documented.

## 2021-03-22 NOTE — Anesthesia Preprocedure Evaluation (Addendum)
Anesthesia Evaluation  Patient identified by MRN, date of birth, ID band Patient awake    Reviewed: Allergy & Precautions, NPO status , Patient's Chart, lab work & pertinent test results  Airway Mallampati: II  TM Distance: >3 FB Neck ROM: Full    Dental  (+) Teeth Intact   Pulmonary former smoker,    breath sounds clear to auscultation       Cardiovascular hypertension,  Rhythm:Regular Rate:Bradycardia     Neuro/Psych    GI/Hepatic   Endo/Other    Renal/GU      Musculoskeletal   Abdominal   Peds  Hematology   Anesthesia Other Findings   Reproductive/Obstetrics                            Anesthesia Physical Anesthesia Plan  ASA: 3  Anesthesia Plan: MAC   Post-op Pain Management:    Induction: Intravenous  PONV Risk Score and Plan: Ondansetron and Propofol infusion  Airway Management Planned: Natural Airway and Nasal Cannula  Additional Equipment:   Intra-op Plan:   Post-operative Plan:   Informed Consent: I have reviewed the patients History and Physical, chart, labs and discussed the procedure including the risks, benefits and alternatives for the proposed anesthesia with the patient or authorized representative who has indicated his/her understanding and acceptance.       Plan Discussed with: CRNA and Anesthesiologist  Anesthesia Plan Comments:         Anesthesia Quick Evaluation

## 2021-03-22 NOTE — Anesthesia Procedure Notes (Signed)
Procedure Name: MAC Date/Time: 03/22/2021 7:36 AM Performed by: Janene Harvey, CRNA Pre-anesthesia Checklist: Patient identified, Emergency Drugs available, Suction available and Patient being monitored Patient Re-evaluated:Patient Re-evaluated prior to induction Oxygen Delivery Method: Nasal cannula Induction Type: IV induction Placement Confirmation: positive ETCO2 Dental Injury: Teeth and Oropharynx as per pre-operative assessment

## 2021-03-22 NOTE — Progress Notes (Signed)
  Echocardiogram Echocardiogram Transesophageal with color and doppler has been performed.  [name] M 03/22/2021, 8:12 AM

## 2021-03-22 NOTE — Discharge Summary (Signed)
Discharge Summary    Patient ID: Louis Barton MRN: [id number]; DOB: Mar 21, 1941  Admit date: 03/18/2021 Discharge date: 03/22/2021  PCP:  Raina Mina., MD   [facility] Providers Cardiologist:  Jenne Campus, MD        Discharge Diagnoses    Principal Problem:   Atrial flutter [facility]) Active Problems:   Hyperlipidemia   Prediabetes   PAD (peripheral artery disease) ([place])   Hypertension    Diagnostic Studies/Procedures    Echocardiogram 03/19/21: 1. Left ventricular ejection fraction, by estimation, is 60 to 65%. The  left ventricle has normal function. The left ventricle has no regional  wall motion abnormalities. There is mild left ventricular hypertrophy.  Left ventricular diastolic parameters  are indeterminate.   2. Right ventricular systolic function is normal. The right ventricular  size is normal.   3. The mitral valve is normal in structure. Mild mitral valve  regurgitation. No evidence of mitral stenosis.   4. The aortic valve is calcified. Aortic valve regurgitation is trivial.  Mild to moderate aortic valve sclerosis/calcification is present, without  any evidence of aortic stenosis.   5. The inferior vena cava is normal in size with greater than 50%  respiratory variability, suggesting right atrial pressure of 3 mmHg. _____________   History of Present Illness     Louis Barton is a 80 y.o. 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   Louis Barton reported that for the last 6 months or so he really has not felt himself.  Prior to [place], he attempted to go to the gym regularly and was able to workout on the elliptical for a good period of time.  Over the last 6 months or so, however, he generally has been too fatigued and short of breath to return to the gym.  He describes a rather insidious  onset of dyspnea on exertion that while stable has been persistent over this time period.  Simple things like walking at a brisk pace will tire him out rather quickly.  He attributes some of this to "laziness."  Concurrent with this shortness of breath, Louis Barton has also noticed frequent palpitations at night.  He has sense that his heart was out of rhythm for some time.  He denies any dizziness, lightheadedness, or syncope.  Additionally, over the last week or 2 he has also noticed worsening lower extremity swelling.  His left leg is always slightly more swollen than his right, but both have significantly swelled over the last week or 2.  He was switched from hydrochlorothiazide to Lasix, and while he did feel like he was peeing a lot more, the swelling has yet to resolve.   Earlier in the week Louis Barton noticed that his blood pressure was elevated into the 190s.  He has consistently had systolic blood pressure readings in the 160s to 190s.  His heart rates have also at times drifted down into the 30s.  He and his family were concerned with his elevated blood pressures, worsening swelling and persistent shortness of breath, so they decided to come to the emergency department for evaluation this evening.   On arrival to the emergency department, Louis Barton was noted to be in atrial flutter with variable but slow ventricular response.  His heart rates have ranged from the 30s to the 33A, with systolic blood pressures to  the 190s.  His BNP was slightly elevated.  A D-dimer was slightly elevated but adjusted for age was within the normal range.      Hospital Course     Consultants: None   1. New onset atrial flutter with SVR: patient presented with weakness and fatigue. He was found to be in atrial flutter with SVR with rates int he 30s at times. Case discussed with EP and decision made to pursue TEE/DCCV. Patient was started on eliquis 5mg  BID. Aspirin and plavix (for history of TIA) were discontinued to  minimize bleeding risk. Patient underwent TEE with successful DCCV with conversion to sinus bradycardia 03/22/21. There were no immediate complications following the procedure and patient was deemed safe for discharge home. AV nodal blocking agents held due to baseline bradycardia - Continue apixaban 5mg  BID   2.  Acute on chronic HFpEF: patient reported increased LE edema prior to admission. He was diuresed with IV lasix 40mg  x2 doses on the day of admission, then further diuresis held. He maintained euvolemic status. Weight on the day of discharge was 181 lbs with UOP net -3.5L this admission. Recommended for prn lasix at discharge.  - Continue lasix as needed for weight gain of 3lbs overnight or 5lbs in 1 week.    3.  Hypertension: BP well controlled this admission on home losartan - Continue Losartan 50mg  daily   4.  Nonobstructive CAD: mild disease noted on [facility] 02/08/2021. No recent anginal complaints. Aspirin discontinued to minimize bleeding risk. Not on Bblocker due to baseline bradycardia.  - Continue Crestor 40mg  daily and Welchol  5. History of TIA: no recent neurological issues. Plavix discontinued to minimize bleeding risk - Continue statin    Did the patient have an acute coronary syndrome (MI, NSTEMI, STEMI, etc) this admission?:  No                               Did the patient have a percutaneous coronary intervention (stent / angioplasty)?:  No.       _____________  Discharge Vitals Blood pressure 124/70, pulse (!) 59, temperature 98.1 F (36.7 C), temperature source Oral, resp. rate 18, height 5\' 8"  (1.727 m), weight 82.1 kg, SpO2 98 %.  Filed Weights   03/21/21 0514 03/22/21 0543 03/22/21 0700  Weight: 82.3 kg 82.2 kg 82.1 kg    Labs & Radiologic Studies    CBC Recent Labs    03/21/21 0405 03/22/21 0402  WBC 7.9 6.3  HGB 14.5 14.4  HCT 44.3 43.1  MCV 93.9 93.7  PLT 130* 767*   Basic Metabolic Panel Recent Labs    03/20/21 0241 03/21/21 0405  03/22/21 0402  NA 138 138 139  K 3.5 3.5 4.0  CL 106 105 107  CO2 26 25 27   GLUCOSE 97 95 89  BUN 14 17 16   CREATININE 0.97 0.98 1.11  CALCIUM 8.6* 8.5* 8.5*  MG 1.8  --   --    Liver Function Tests No results for input(s): AST, ALT, ALKPHOS, BILITOT, PROT, ALBUMIN in the last 72 hours. No results for input(s): LIPASE, AMYLASE in the last 72 hours. High Sensitivity Troponin:   Recent Labs  Lab 03/18/21 2259 03/19/21 0128  TROPONINIHS 14 13    BNP Invalid input(s): POCBNP D-Dimer No results for input(s): DDIMER in the last 72 hours. Hemoglobin A1C No results for input(s): HGBA1C in the last 72 hours. Fasting Lipid Panel No results for  input(s): CHOL, HDL, LDLCALC, TRIG, CHOLHDL, LDLDIRECT in the last 72 hours. Thyroid Function Tests No results for input(s): TSH, T4TOTAL, T3FREE, THYROIDAB in the last 72 hours.  Invalid input(s): FREET3 _____________  CT ABDOMEN PELVIS WO CONTRAST  Result Date: 03/19/2021 CLINICAL DATA:  Abdominal distension, lower extremity edema, abnormal lower extremity venous ultrasound question more central clot, history stage III A chronic kidney disease, coronary artery disease, hypertension, hiatal hernia, former smoker EXAM: CT ABDOMEN AND PELVIS WITHOUT CONTRAST TECHNIQUE: Multidetector CT imaging of the abdomen and pelvis was performed following the standard protocol without IV contrast. Sagittal and coronal MPR images reconstructed from axial data set. COMPARISON:  02/04/2020 FINDINGS: Lower chest: Postsurgical changes RIGHT lower lobe with small RIGHT pleural effusion and RIGHT lower lobe atelectasis versus scarring. Remaining lung bases clear. Hepatobiliary: Gallbladder and liver normal appearance Pancreas: Normal appearance Spleen: Normal appearance Adrenals/Urinary Tract: Adrenal glands normal appearance. BILATERAL renal cysts including peripelvic cysts, largest cyst anterior LEFT kidney 3.9 x 2.7 cm. Ureters and bladder unremarkable. Stomach/Bowel:  Normal appendix. Redundant sigmoid colon. Lipoma of the third portion of the duodenum 29 x 10 x 19 mm. Small hiatal hernia. Stomach and bowel loops otherwise normal appearance. Vascular/Lymphatic: Atherosclerotic calcifications involving coronary arteries, aorta, visceral arteries, iliac arteries, common femoral arteries. Aorta normal caliber. No adenopathy. Reproductive: Unremarkable prostate gland and seminal vesicles Other: No free air or free fluid.  No hernia. Musculoskeletal: Scattered degenerative disc disease changes lumbar spine. IMPRESSION: BILATERAL renal cysts. 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 Small hiatal hernia. Stable postsurgical changes and probable atelectasis/scarring at RIGHT lung base. Scattered atherosclerotic calcifications including coronary arteries and visceral arteries. No acute intra-abdominal or intrapelvic abnormalities. Aortic Atherosclerosis (ICD10-I70.0). Electronically Signed   By: Lavonia Dana M.D.   On: 03/19/2021 14:16   DG Chest Portable 1 View  Result Date: 03/18/2021 CLINICAL DATA:  80 year old male with shortness of breath. EXAM: PORTABLE CHEST 1 VIEW COMPARISON:  Chest radiograph dated 01/19/2021. FINDINGS: Chronic right lung base atelectasis/scarring pleural thickening. The lungs are clear. There is no pneumothorax. Stable cardiomediastinal silhouette. Atherosclerotic calcification of the aorta. No acute osseous pathology. IMPRESSION: 1. No acute cardiopulmonary process. 2. Stable chronic changes of the right lung base. Electronically Signed   By: Anner Crete M.D.   On: 03/18/2021 23:23   ECHOCARDIOGRAM COMPLETE  Result Date: 03/19/2021    ECHOCARDIOGRAM REPORT   Patient Name:   CRYSTIAN FRITH Date of Exam: 03/19/2021 Medical Rec #:  [id number]     Height:       68.0 in Accession #:    [id number]    Weight:       184.7 lb Date of Birth:  Jul 04, 1941      BSA:          1.976 m Patient Age:    62 years      BP:           147/80 mmHg Patient Gender: M              HR:           51 bpm. Exam Location:  Inpatient Procedure: 2D Echo, Cardiac Doppler and Color Doppler Indications:    Atrial flutter  History:        Patient has prior history of Echocardiogram examinations, most                 recent 04/13/2020. Stroke; Risk Factors:Hypertension and  Dyslipidemia.  Sonographer:    Cammy Brochure Referring Phys: [id number] [place]  1. Left ventricular ejection fraction, by estimation, is 60 to 65%. The left ventricle has normal function. The left ventricle has no regional wall motion abnormalities. There is mild left ventricular hypertrophy. Left ventricular diastolic parameters are indeterminate.  2. Right ventricular systolic function is normal. The right ventricular size is normal.  3. The mitral valve is normal in structure. Mild mitral valve regurgitation. No evidence of mitral stenosis.  4. The aortic valve is calcified. Aortic valve regurgitation is trivial. Mild to moderate aortic valve sclerosis/calcification is present, without any evidence of aortic stenosis.  5. The inferior vena cava is normal in size with greater than 50% respiratory variability, suggesting right atrial pressure of 3 mmHg. FINDINGS  Left Ventricle: Left ventricular ejection fraction, by estimation, is 60 to 65%. The left ventricle has normal function. The left ventricle has no regional wall motion abnormalities. The left ventricular internal cavity size was normal in size. There is  mild left ventricular hypertrophy. Left ventricular diastolic parameters are indeterminate. Right Ventricle: The right ventricular size is normal.Right ventricular systolic function is normal. Left Atrium: Left atrial size was normal in size. Right Atrium: Right atrial size was normal in size. Pericardium: There is no evidence of pericardial effusion. Mitral Valve: The mitral valve is normal in structure. Mild mitral annular calcification. Mild mitral valve regurgitation. No evidence of  mitral valve stenosis. Tricuspid Valve: The tricuspid valve is normal in structure. Tricuspid valve regurgitation is trivial. No evidence of tricuspid stenosis. Aortic Valve: The aortic valve is calcified. Aortic valve regurgitation is trivial. Mild to moderate aortic valve sclerosis/calcification is present, without any evidence of aortic stenosis. Aortic valve mean gradient measures 2.7 mmHg. Aortic valve peak  gradient measures 5.1 mmHg. Aortic valve area, by VTI measures 2.24 cm. Pulmonic Valve: The pulmonic valve was grossly normal. Pulmonic valve regurgitation is not visualized. No evidence of pulmonic stenosis. Aorta: The aortic root is normal in size and structure. Venous: The inferior vena cava is normal in size with greater than 50% respiratory variability, suggesting right atrial pressure of 3 mmHg. IAS/Shunts: No atrial level shunt detected by color flow Doppler.  LEFT VENTRICLE PLAX 2D LVIDd:         4.40 cm LVIDs:         3.20 cm LV PW:         1.00 cm LV IVS:        1.00 cm LVOT diam:     2.10 cm LV SV:         48 LV SV Index:   25 LVOT Area:     3.46 cm  RIGHT VENTRICLE          IVC RV Basal diam:  4.10 cm  IVC diam: 1.50 cm LEFT ATRIUM             Index       RIGHT ATRIUM           Index LA diam:        3.30 cm 1.67 cm/m  RA Area:     20.90 cm LA Vol (A2C):   74.3 ml 37.61 ml/m RA Volume:   54.70 ml  27.69 ml/m LA Vol (A4C):   52.3 ml 26.47 ml/m LA Biplane Vol: 61.8 ml 31.28 ml/m  AORTIC VALVE AV Area (Vmax):    1.97 cm AV Area (Vmean):   1.96 cm AV Area (VTI):  2.24 cm AV Vmax:           112.90 cm/s AV Vmean:          78.033 cm/s AV VTI:            0.217 m AV Peak Grad:      5.1 mmHg AV Mean Grad:      2.7 mmHg LVOT Vmax:         64.15 cm/s LVOT Vmean:        44.050 cm/s LVOT VTI:          0.140 m LVOT/AV VTI ratio: 0.65  AORTA Ao Root diam: 3.40 cm Ao Asc diam:  3.20 cm MITRAL VALVE MV Area (PHT): 4.89 cm    SHUNTS MV Decel Time: 155 msec    Systemic VTI:  0.14 m MV E velocity:  89.30 cm/s  Systemic Diam: 2.10 cm MV A velocity: 44.00 cm/s MV E/A ratio:  2.03 Kirk Ruths MD Electronically signed by Kirk Ruths MD Signature Date/Time: 03/19/2021/2:37:00 PM    Final    ECHO TEE  Result Date: 03/22/2021    TRANSESOPHOGEAL ECHO REPORT   Patient Name:   HAAKON TITSWORTH Date of Exam: 03/22/2021 Medical Rec #:  [id number]     Height:       68.0 in Accession #:    [id number]    Weight:       181.0 lb Date of Birth:  05/21/1941      BSA:          1.959 m Patient Age:    29 years      BP:           193/73 mmHg Patient Gender: M             HR:           97 bpm. Exam Location:  Inpatient Procedure: Transesophageal Echo, Cardiac Doppler and Color Doppler Indications:     Atrial flutter I48.92  History:         Patient has prior history of Echocardiogram examinations, most                  recent 03/19/2021. CAD, Carotid Disease and PAD,                  Arrythmias:Bradycardia, First degree AV block and RBBB; Risk                  Factors:Dyslipidemia and Hypertension. Ascending aortic                  aneurysm. Chronic kidney disease.  Sonographer:     [name] RDCS Referring Phys:  [id number] [place] Diagnosing Phys: Kirk Ruths MD PROCEDURE: After discussion of the risks and benefits of a TEE, an informed consent was obtained from the patient. TEE procedure time was 38 minutes. The transesophogeal probe was passed without difficulty through the esophogus of the patient. Imaged were obtained with the patient in a left lateral decubitus position. Local oropharyngeal anesthetic was provided with Cetacaine. Sedation performed by different physician. The patient was monitored while under deep sedation. Anesthestetic sedation was provided intravenously by Anesthesiology: 156.29mg  of Propofol, 40mg  of Lidocaine. Image quality was good. The patient's vital signs; including heart rate, blood pressure, and oxygen saturation; remained stable throughout the procedure. The patient developed no  complications during the procedure. A successful direct current cardioversion was performed at 120 joules with 1 attempt. IMPRESSIONS  1. Left ventricular  ejection fraction, by estimation, is 55 to 60%. The left ventricle has normal function.  2. Right ventricular systolic function is normal. The right ventricular size is normal.  3. Left atrial size was mildly dilated. No left atrial/left atrial appendage thrombus was detected.  4. Right atrial size was mildly dilated.  5. The mitral valve is normal in structure. Mild mitral valve regurgitation.  6. The aortic valve is tricuspid. Aortic valve regurgitation is mild. Mild aortic valve sclerosis is present, with no evidence of aortic valve stenosis.  7. There is Moderate (Grade III) plaque involving the descending aorta. FINDINGS  Left Ventricle: Left ventricular ejection fraction, by estimation, is 55 to 60%. The left ventricle has normal function. The left ventricular internal cavity size was normal in size. Right Ventricle: The right ventricular size is normal. Right vetricular wall thickness was not assessed. Left Atrium: Left atrial size was mildly dilated. No left atrial/left atrial appendage thrombus was detected. Right Atrium: Right atrial size was mildly dilated. Pericardium: Trivial pericardial effusion is present. Mitral Valve: The mitral valve is normal in structure. Mild mitral valve regurgitation. Tricuspid Valve: The tricuspid valve is normal in structure. Tricuspid valve regurgitation is mild. Aortic Valve: The aortic valve is tricuspid. Aortic valve regurgitation is mild. Mild aortic valve sclerosis is present, with no evidence of aortic valve stenosis. Pulmonic Valve: The pulmonic valve was normal in structure. Pulmonic valve regurgitation is not visualized. Aorta: The aortic root is normal in size and structure. There is moderate (Grade III) plaque involving the descending aorta. IAS/Shunts: No atrial level shunt detected by color flow Doppler.    AORTA Ao Root diam: 3.70 cm Ao Asc diam:  3.70 cm Kirk Ruths MD Electronically signed by Kirk Ruths MD Signature Date/Time: 03/22/2021/9:20:31 AM    Final    Disposition   Pt is being discharged home today in good condition.  Follow-up Plans & Appointments     Follow-up Information     Park Liter, MD Follow up on 04/12/2021.   Specialty: Cardiology Why: Please arrive 15 minutes early for your 3pm post-hospital cardiology appointment Contact information: [place] [address] [phone]         Raina Mina., MD. Daphane Shepherd on 03/31/2021.   Specialty: Internal Medicine Why: @8 :00am Contact information: [place] [address] [phone]                  Discharge Medications   Allergies as of 03/22/2021   No Known Allergies      Medication List     STOP taking these medications    aspirin EC 81 MG tablet   clopidogrel 75 MG tablet Commonly known as: PLAVIX       TAKE these medications    acetaminophen 500 MG tablet Commonly known as: TYLENOL Take 1,000 mg by mouth every 6 (six) hours as needed for mild pain or moderate pain (for pain.).   apixaban 5 MG Tabs tablet Commonly known as: ELIQUIS Take 1 tablet (5 mg total) by mouth 2 (two) times daily.   colesevelam 625 MG tablet Commonly known as: WELCHOL Take 1,250 mg by mouth 2 (two) times daily with a meal.   FOLATE PO Take 1 tablet by mouth daily with lunch.   furosemide 40 MG tablet Commonly known as: LASIX Take 1 tablet (40 mg total) by mouth daily as needed for edema (for weight gain of 3lbs overnight, 5 lbs in 1 week). What changed:  when  to take this reasons to take this   losartan 50 MG tablet Commonly known as: COZAAR Take 50 mg by mouth daily.   potassium chloride 10 MEQ tablet Commonly known as: KLOR-CON Take 2 tablets (20 mEq total) by mouth daily as needed (take on days you take lasix). What changed:  when to take  this reasons to take this   rosuvastatin 40 MG tablet Commonly known as: CRESTOR Take 1 tablet (40 mg total) by mouth at bedtime.   SYSTANE ULTRA OP Place 1 drop into both eyes daily. Unknown strength   valACYclovir 1000 MG tablet Commonly known as: VALTREX Take 1,000 mg by mouth 2 (two) times daily as needed (for fever blisters.).   Vitamin D3 50 MCG (2000 UT) Tabs Take 2,000 Units by mouth daily.           Outstanding Labs/Studies   None  Duration of Discharge Encounter   Greater than 30 minutes including physician time.  Signed, Abigail Butts, PA-C 03/22/2021, 1:21 PM

## 2021-03-22 NOTE — Progress Notes (Signed)
Patient transported to Endo for cardioversion/TEE

## 2021-03-22 NOTE — Plan of Care (Signed)
  Problem: Education: Goal: Knowledge of General Education information will improve Description: Including pain rating scale, medication(s)/side effects and non-pharmacologic comfort measures Outcome: Progressing   Problem: Health Behavior/Discharge Planning: Goal: Ability to manage health-related needs will improve Outcome: Progressing   Problem: Activity: Goal: Risk for activity intolerance will decrease Outcome: Progressing   

## 2021-03-22 NOTE — TOC Benefit Eligibility Note (Signed)
Transition of Care [facility]) Benefit Eligibility Note    Patient Details  Name: Louis Barton MRN: [id number] Date of Birth: [birth year]   Medication/Dose: Arne Cleveland  5 MG  BID  Covered?: Yes  Tier: 3 Drug  Prescription Coverage Preferred Pharmacy: [facility] with Person/Company/Phone Number:: ANN  @  [employer]  [facility]  #  [id number]  Co-Pay: $47.00  Prior Approval: No  Deductible: Met (OUT-OF-POCKET : Linton Ham Phone Number: 03/22/2021, 2:17 PM

## 2021-03-22 NOTE — Progress Notes (Signed)
Cardiology Progress Note  Patient ID: Louis Barton MRN: [id number] DOB: 1941/01/23 Date of Encounter: 03/22/2021  Primary Cardiologist: Jenne Campus, MD  Subjective   Chief Complaint: None.  HPI:  Heart rate in the 30s overnight in atrial flutter.  No symptoms.  Euvolemic. S/P successful DCCV to NSR this am.   ROS:  All other ROS reviewed and negative. Pertinent positives noted in the HPI.     Inpatient Medications  Scheduled Meds:  apixaban  5 mg Oral BID   colesevelam  1,250 mg Oral BID WC   losartan  50 mg Oral Daily   rosuvastatin  40 mg Oral QHS   Continuous Infusions:  PRN Meds: acetaminophen **OR** acetaminophen   Vital Signs   Vitals:   03/22/21 0816 03/22/21 0825 03/22/21 0925 03/22/21 0927  BP: 138/64 (!) 146/70  130/76  Pulse: 61 63  (!) 57  Resp: 19 19 18    Temp:    97.6 F (36.4 C)  TempSrc:    Oral  SpO2: 94% 96%  99%  Weight:      Height:        Intake/Output Summary (Last 24 hours) at 03/22/2021 0949 Last data filed at 03/22/2021 0804 Gross per 24 hour  Intake 645.93 ml  Output 1025 ml  Net -379.07 ml    Last 3 Weights 03/22/2021 03/22/2021 03/21/2021  Weight (lbs) 181 lb 181 lb 3.2 oz 181 lb 6.4 oz  Weight (kg) 82.101 kg 82.192 kg 82.283 kg      Telemetry  Overnight telemetry shows atrial flutter heart rate 30-80 bpm.  Now sinus bradycardia  s/p DCCV - personally reviewed.   ECG  Sinus bradycardia at 59bpm with RBBB and 1st degree AV block and nonspecific T wave abnormality - I personally reviewed.   Physical Exam   Vitals:   03/22/21 0816 03/22/21 0825 03/22/21 0925 03/22/21 0927  BP: 138/64 (!) 146/70  130/76  Pulse: 61 63  (!) 57  Resp: 19 19 18    Temp:    97.6 F (36.4 C)  TempSrc:    Oral  SpO2: 94% 96%  99%  Weight:      Height:        Intake/Output Summary (Last 24 hours) at 03/22/2021 0949 Last data filed at 03/22/2021 0804 Gross per 24 hour  Intake 645.93 ml  Output 1025 ml  Net -379.07 ml     Last 3 Weights  03/22/2021 03/22/2021 03/21/2021  Weight (lbs) 181 lb 181 lb 3.2 oz 181 lb 6.4 oz  Weight (kg) 82.101 kg 82.192 kg 82.283 kg    Body mass index is 27.52 kg/m.  GEN: Well nourished, well developed in no acute distress HEENT: Normal NECK: No JVD; No carotid bruits LYMPHATICS: No lymphadenopathy CARDIAC:RRR, no murmurs, rubs, gallops RESPIRATORY:  Clear to auscultation without rales, wheezing or rhonchi  ABDOMEN: Soft, non-tender, non-distended MUSCULOSKELETAL:  No edema; No deformity  SKIN: Warm and dry NEUROLOGIC:  Alert and oriented x 3 PSYCHIATRIC:  Normal affect   Labs  High Sensitivity Troponin:   Recent Labs  Lab 03/18/21 2259 03/19/21 0128  TROPONINIHS 14 13      Cardiac EnzymesNo results for input(s): TROPONINI in the last 168 hours. No results for input(s): TROPIPOC in the last 168 hours.  Chemistry Recent Labs  Lab 03/18/21 1816 03/20/21 0241 03/21/21 0405 03/22/21 0402  NA 135 138 138 139  K 4.2 3.5 3.5 4.0  CL 101 106 105 107  CO2 26 26 25 27   GLUCOSE  89 97 95 89  BUN 12 14 17 16   CREATININE 0.91 0.97 0.98 1.11  CALCIUM 8.7* 8.6* 8.5* 8.5*  PROT 6.4*  --   --   --   ALBUMIN 3.6  --   --   --   AST 21  --   --   --   ALT 17  --   --   --   ALKPHOS 44  --   --   --   BILITOT 0.8  --   --   --   GFRNONAA >60 >60 >60 >60  ANIONGAP 8 6 8 5      Hematology Recent Labs  Lab 03/20/21 0241 03/21/21 0405 03/22/21 0402  WBC 7.2 7.9 6.3  RBC 4.74 4.72 4.60  HGB 14.6 14.5 14.4  HCT 43.7 44.3 43.1  MCV 92.2 93.9 93.7  MCH 30.8 30.7 31.3  MCHC 33.4 32.7 33.4  RDW 13.6 13.5 13.5  PLT 139* 130* 129*    BNP Recent Labs  Lab 03/18/21 1816  BNP 187.1*     DDimer  Recent Labs  Lab 03/18/21 2259  DDIMER 0.74*      Radiology  ECHO TEE  Result Date: 03/22/2021    TRANSESOPHOGEAL ECHO REPORT   Patient Name:   Louis Barton Date of Exam: 03/22/2021 Medical Rec #:  [id number]     Height:       68.0 in Accession #:    [id number]    Weight:       181.0 lb  Date of Birth:  1941/06/16      BSA:          1.959 m Patient Age:    80 years      BP:           193/73 mmHg Patient Gender: M             HR:           97 bpm. Exam Location:  Inpatient Procedure: Transesophageal Echo, Cardiac Doppler and Color Doppler Indications:     Atrial flutter I48.92  History:         Patient has prior history of Echocardiogram examinations, most                  recent 03/19/2021. CAD, Carotid Disease and PAD,                  Arrythmias:Bradycardia, First degree AV block and RBBB; Risk                  Factors:Dyslipidemia and Hypertension. Ascending aortic                  aneurysm. Chronic kidney disease.  Sonographer:     [name] RDCS Referring Phys:  [id number] [place] Diagnosing Phys: Kirk Ruths MD PROCEDURE: After discussion of the risks and benefits of a TEE, an informed consent was obtained from the patient. TEE procedure time was 38 minutes. The transesophogeal probe was passed without difficulty through the esophogus of the patient. Imaged were obtained with the patient in a left lateral decubitus position. Local oropharyngeal anesthetic was provided with Cetacaine. Sedation performed by different physician. The patient was monitored while under deep sedation. Anesthestetic sedation was provided intravenously by Anesthesiology: 156.29mg  of Propofol, 40mg  of Lidocaine. Image quality was good. The patient's vital signs; including heart rate, blood pressure, and oxygen saturation; remained stable throughout the procedure. The patient developed no complications during the  procedure. A successful direct current cardioversion was performed at 120 joules with 1 attempt. IMPRESSIONS  1. Left ventricular ejection fraction, by estimation, is 55 to 60%. The left ventricle has normal function.  2. Right ventricular systolic function is normal. The right ventricular size is normal.  3. Left atrial size was mildly dilated. No left atrial/left atrial appendage thrombus was detected.   4. Right atrial size was mildly dilated.  5. The mitral valve is normal in structure. Mild mitral valve regurgitation.  6. The aortic valve is tricuspid. Aortic valve regurgitation is mild. Mild aortic valve sclerosis is present, with no evidence of aortic valve stenosis.  7. There is Moderate (Grade III) plaque involving the descending aorta. FINDINGS  Left Ventricle: Left ventricular ejection fraction, by estimation, is 55 to 60%. The left ventricle has normal function. The left ventricular internal cavity size was normal in size. Right Ventricle: The right ventricular size is normal. Right vetricular wall thickness was not assessed. Left Atrium: Left atrial size was mildly dilated. No left atrial/left atrial appendage thrombus was detected. Right Atrium: Right atrial size was mildly dilated. Pericardium: Trivial pericardial effusion is present. Mitral Valve: The mitral valve is normal in structure. Mild mitral valve regurgitation. Tricuspid Valve: The tricuspid valve is normal in structure. Tricuspid valve regurgitation is mild. Aortic Valve: The aortic valve is tricuspid. Aortic valve regurgitation is mild. Mild aortic valve sclerosis is present, with no evidence of aortic valve stenosis. Pulmonic Valve: The pulmonic valve was normal in structure. Pulmonic valve regurgitation is not visualized. Aorta: The aortic root is normal in size and structure. There is moderate (Grade III) plaque involving the descending aorta. IAS/Shunts: No atrial level shunt detected by color flow Doppler.   AORTA Ao Root diam: 3.70 cm Ao Asc diam:  3.70 cm Kirk Ruths MD Electronically signed by Kirk Ruths MD Signature Date/Time: 03/22/2021/9:20:31 AM    Final     Cardiac Studies   TTE 03/19/2021  1. Left ventricular ejection fraction, by estimation, is 60 to 65%. The  left ventricle has normal function. The left ventricle has no regional  wall motion abnormalities. There is mild left ventricular hypertrophy.  Left  ventricular diastolic parameters  are indeterminate.   2. Right ventricular systolic function is normal. The right ventricular  size is normal.   3. The mitral valve is normal in structure. Mild mitral valve  regurgitation. No evidence of mitral stenosis.   4. The aortic valve is calcified. Aortic valve regurgitation is trivial.  Mild to moderate aortic valve sclerosis/calcification is present, without  any evidence of aortic stenosis.   5. The inferior vena cava is normal in size with greater than 50%  respiratory variability, suggesting right atrial pressure of 3 mmHg.   LHC 02/08/2021  Mid Cx to Dist Cx lesion is 20% stenosed. Prox LAD to Mid LAD lesion is 20% stenosed.   1. Mild non-obstructive CAD 2. 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  Found to be in atrial flutter with SVR.  Assessment & Plan   New onset atrial flutter with SVR/first-degree AV block/right bundle branch block -Admitted with weakness and fatigue.  Did have a bit of volume on him.  Diuresed.  Found to be  in atrial flutter with SVR. -Clearly he has underlying conduction disease.  Case was discussed with the EP on Friday.   -now s/p TEE/DCCV today to sinus bradycardia -past DCCV EKG showed sinus bradycardia at 59bpm with RBBB and prolonged 1st degree AVB -TSH normal -2D echo with normal LVF/TEE today showed normal heart function with mild LAE, mild AI/MR and TR with no LAA thrombus -No need for rate control meds.   -Avoid AV nodal agents given significant conduction disease. -Continue Eliquis 5 mg twice daily.  2.  Acute on chronic HFpEF -Diuresed this admission and is  net neg 3.1L -SCr 1.11 today and K+ 4 -Oral diuretics as needed.  3.  Hypertension -BP well controlled on exam today -continue Losartan 50mg  daily  4.  Nonobstructive CAD -denies any angina -continue Crestor 40mg  daily and Welchol -no ASA due to DOAC -no BB due to bradycardia and conduction system disease  He is very stable post TEE/DDCV earlier this am. Does not appear volume overloaded. HR stable in upper 50's low 60's.  OK for discharge home.  Will not need any AVN blocking agents given his resting bradycardia.  Will set up outpt followup with [name]  For questions or updates, please contact [place] Please consult [email or web address] for contact info under     Signed, Fransico Him, MD [place]  03/22/2021 9:49 AM

## 2021-03-22 NOTE — TOC Transition Note (Signed)
Transition of Care [facility]) - CM/SW Discharge Note   Patient Details  Name: Louis Barton MRN: [id number] Date of Birth: 12-10-1940  Transition of [facility]) CM/SW Contact:  Zenon Mayo, RN Phone Number: 03/22/2021, 2:14 PM   Clinical Narrative:    Patient is for dc today, will be on eliquis, TOC to fill for him the first 30 days free.  TOC will deliver meds to patient room.   Final next level of care: Home/Self Care Barriers to Discharge: No Barriers Identified   Patient Goals and CMS Choice Patient states their goals for this hospitalization and ongoing recovery are:: return home   Choice offered to / list presented to : NA  Discharge Placement                       Discharge Plan and Services                  DME Agency: NA       HH Arranged: NA          Social Determinants of Health (SDOH) Interventions     Readmission Risk Interventions No flowsheet data found.

## 2021-03-22 NOTE — Plan of Care (Signed)

## 2021-03-22 NOTE — Progress Notes (Signed)
D/C instruction given and reviewed. Tele and IV removed, tolerated well. Awaiting TOC med delivery.

## 2021-03-22 NOTE — Interval H&P Note (Signed)
History and Physical Interval Note:  03/22/2021 7:20 AM  Louis Barton  has presented today for surgery, with the diagnosis of A-FIB.  The various methods of treatment have been discussed with the patient and family. After consideration of risks, benefits and other options for treatment, the patient has consented to  Procedure(s): TRANSESOPHAGEAL ECHOCARDIOGRAM (TEE) (N/A) CARDIOVERSION (N/A) as a surgical intervention.  The patient's history has been reviewed, patient examined, no change in status, stable for surgery.  I have reviewed the patient's chart and labs.  Questions were answered to the patient's satisfaction.     Kirk Ruths

## 2021-03-23 NOTE — Anesthesia Postprocedure Evaluation (Signed)
Anesthesia Post Note  Patient: Louis Barton  Procedure(s) Performed: TRANSESOPHAGEAL ECHOCARDIOGRAM (TEE) CARDIOVERSION     Patient location during evaluation: Endoscopy Anesthesia Type: MAC Level of consciousness: awake and alert Pain management: pain level controlled Vital Signs Assessment: post-procedure vital signs reviewed and stable Respiratory status: spontaneous breathing, nonlabored ventilation, respiratory function stable and patient connected to nasal cannula oxygen Cardiovascular status: blood pressure returned to baseline and stable Postop Assessment: no apparent nausea or vomiting Anesthetic complications: no   No notable events documented.  Last Vitals:  Vitals:   03/22/21 0927 03/22/21 1320  BP: 130/76 124/70  Pulse: (!) 57 (!) 59  Resp:    Temp: 36.4 C 36.7 C  SpO2: 99% 98%    Last Pain:  Vitals:   03/22/21 1320  TempSrc: Oral  PainSc:                  Tawanda Schall COKER

## 2021-03-25 ENCOUNTER — Encounter (HOSPITAL_COMMUNITY): Payer: Self-pay | Admitting: Cardiology

## 2021-03-26 ENCOUNTER — Telehealth: Payer: Self-pay | Admitting: Nurse Practitioner

## 2021-03-26 ENCOUNTER — Other Ambulatory Visit: Payer: Self-pay

## 2021-03-26 ENCOUNTER — Ambulatory Visit (INDEPENDENT_AMBULATORY_CARE_PROVIDER_SITE_OTHER): Payer: Medicare Other | Admitting: Cardiology

## 2021-03-26 ENCOUNTER — Encounter: Payer: Self-pay | Admitting: Cardiology

## 2021-03-26 ENCOUNTER — Telehealth: Payer: Self-pay | Admitting: Emergency Medicine

## 2021-03-26 VITALS — BP 168/94 | HR 55 | Ht 68.0 in | Wt 186.0 lb

## 2021-03-26 DIAGNOSIS — R519 Headache, unspecified: Secondary | ICD-10-CM | POA: Diagnosis not present

## 2021-03-26 DIAGNOSIS — I639 Cerebral infarction, unspecified: Secondary | ICD-10-CM

## 2021-03-26 DIAGNOSIS — I440 Atrioventricular block, first degree: Secondary | ICD-10-CM

## 2021-03-26 DIAGNOSIS — I2510 Atherosclerotic heart disease of native coronary artery without angina pectoris: Secondary | ICD-10-CM

## 2021-03-26 DIAGNOSIS — I4510 Unspecified right bundle-branch block: Secondary | ICD-10-CM

## 2021-03-26 DIAGNOSIS — Z7901 Long term (current) use of anticoagulants: Secondary | ICD-10-CM | POA: Diagnosis not present

## 2021-03-26 DIAGNOSIS — I4892 Unspecified atrial flutter: Secondary | ICD-10-CM

## 2021-03-26 MED ORDER — LOSARTAN POTASSIUM 100 MG PO TABS: 100.0000 mg | ORAL_TABLET | Freq: Every day | ORAL | 1 refills | Status: DC

## 2021-03-26 NOTE — Patient Instructions (Signed)
Medication Instructions:  Your physician has recommended you make the following change in your medication:  INCREASE: Losartan 100 mg daily  *If you need a refill on your cardiac medications before your next appointment, please call your pharmacy*   Lab Work: Your physician recommends that you return for lab work in 1 week : BMP  If you have labs (blood work) drawn today and your tests are completely normal, you will receive your results only by: [place] (if you have MyChart) OR A paper copy in the mail If you have any lab test that is abnormal or we need to change your treatment, we will call you to review the results.   Testing/Procedures: Non-Cardiac CT scanning, (CAT scanning), is a noninvasive, special x-ray that produces cross-sectional images of the body using x-rays and a computer. CT scans help physicians diagnose and treat medical conditions. For some CT exams, a contrast material is used to enhance visibility in the area of the body being studied. CT scans provide greater clarity and reveal more details than regular x-ray exams.    Follow-Up: At [facility], you and your health needs are our priority.  As part of our continuing mission to provide you with exceptional heart care, we have created designated Provider Care Teams.  These Care Teams include your primary Cardiologist (physician) and Advanced Practice Providers (APPs -  Physician Assistants and Nurse Practitioners) who all work together to provide you with the care you need, when you need it.  We recommend signing up for the patient portal called "MyChart".  Sign up information is provided on this After Visit Summary.  MyChart is used to connect with patients for Virtual Visits (Telemedicine).  Patients are able to view lab/test results, encounter notes, upcoming appointments, etc.  Non-urgent messages can be sent to your provider as well.   To learn more about what you can do with MyChart, go to  [email or web address].    Your next appointment:   4 week(s)  The format for your next appointment:   In Person  Provider:   Jenne Campus, MD   Other Instructions

## 2021-03-26 NOTE — Telephone Encounter (Signed)
Tried to call patient to inform him of CT results. Left message for him to return call.   HEAD CT was normal.

## 2021-03-26 NOTE — Progress Notes (Signed)
Cardiology Office Note:    Date:  03/26/2021   ID:  Louis Barton, DOB [birth year], MRN [id number]  PCP:  Raina Mina., MD  Cardiologist:  Jenne Campus, MD    Referring MD: Raina Mina., MD   Chief Complaint  Patient presents with   Hypertension    History of Present Illness:    Louis Barton is a 80 y.o. male with quite complex past medical history.  That include coronary artery disease, mild nonobstructive, cardiac catheterization done in May 2022 showed 20% mid and distal circumflex as well as 20% of mid LAD lesion.  Also history of essential hypertension first-degree AV block, status post bilateral carotic endarterectomy, non-small cell lung cancer status post wedge resection and right lower lobe, remote history of TIA.  Recently he ended up being in [facility] because of episode of atrial flutter with slow ventricular rate.  He did have TEE done and then eventually electrical cardioversion.  He comes today to my office after that that visit.  Overall he is not doing well still not feeling right.  He complains of having some weakness and fatigue on top of that since this morning he started complaining of mild headache on the right side of his head gradually getting worse.  He said usually he is able to suppress headache with Tylenol but not today headache is getting gradually worse.  His blood pressure also appears to be elevated.  Denies having dizziness or passing out.  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 signficant DDD present with vacuum effect L4-5 and L5-S1 flat discs and anterior spurring noted   Dizziness 05/06/2020   Dyslipidemia 03/18/2015   Dyspnea on exertion 04/02/2020   Ear itch 05/14/2018   Edema 11/16/2015   Edema of both lower extremities due to peripheral venous insufficiency 02/03/2020   First degree AV block 10/20/2020   History of hiatal hernia    seen on last scan- as slight    History of thrombocytopenia    History of TIAs    Hyperlipidemia    Hypertension    Irritable bowel syndrome 11/17/2016   rec trial of citrucel/ diet 11/16/2016 >>>      Local edema 02/07/2018   Malaise and fatigue 12/08/2020   Malignant neoplasm of left lung ([place]) 12/08/2017   Stage 1A. [place]. And Dr. 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 Renal parapelvic cysts 10/06/16 on CT , right kidney midpole 1.2 cm, right kidney anterior pole 1.1 cm, and to the left kidney lower pole anterior position 3.5cm all felt to be benign   Right bundle branch block 10/20/2020   Right lower lobe pulmonary nodule 11/09/2017   Shingles    internal- obstruction of bowels & gallbladder   Solitary pulmonary nodule 11/16/2016   CT ABd 03/24/09 linerar densities in RLL and RML c/w scarring o/w clear CT Abd 10/20/16 c/w 7 mm nodule  - CT chest 02/13/2017 :  slt enlarged to 9 mm    - PET 04/04/17 :1. These slowly enlarging 9 mm right lower lobe pulmonary nodule along the right hemidiaphragm does not appear hypermetabolic today. Location adjacent to the hemidiaphragm can cause false negatives due to motion artifact related to the   Stage 3a chronic kidney disease ([place]) 06/17/2020   Stroke ([place])    Swelling of limb-Left foot 01/01/2015   Vitamin B12 deficiency 06/21/2017    Past Surgical History:  Procedure Laterality Date   adenocarcinoma  2019   Removed   CARDIOVERSION N/A 03/22/2021   Procedure: CARDIOVERSION;  Surgeon: Lelon Perla, MD;  Location: [place];  Service: Cardiovascular;  Laterality: N/A;   CAROTID ENDARTERECTOMY  2005   Left carotid by Dr. Oneida Alar   CAROTID ENDARTERECTOMY  04/25/11   Right Carotid by Dr. Oneida Alar   COLONOSCOPY  02/23/2017   Colonic polyp status post polypectomy. Pancolonic diverticulosis predominatly in the sigmoid colon. Tubular adenoma   LEFT HEART CATH AND CORONARY ANGIOGRAPHY N/A 02/08/2021   Procedure: LEFT HEART CATH AND CORONARY ANGIOGRAPHY;  Surgeon: Burnell Blanks, MD;  Location: [place] [facility];  Service: Cardiovascular;  Laterality: N/A;   Septoplasty, nasal w/ submucosal resection  1960   SQUAMOUS CELL CARCINOMA EXCISION     pt said numerous times   TEE WITHOUT CARDIOVERSION N/A 03/22/2021   Procedure: TRANSESOPHAGEAL ECHOCARDIOGRAM (TEE);  Surgeon: Lelon Perla, MD;  Location: [place];  Service: Cardiovascular;  Laterality: N/A;   TONSILLECTOMY     VIDEO ASSISTED THORACOSCOPY (VATS)/WEDGE RESECTION Right 11/09/2017   Procedure: RIGHT VIDEO ASSISTED THORACOSCOPY WITH Chales Salmon RESECTION;  Surgeon: [name], MD;  Location: [place];  Service: Thoracic;  Laterality: Right;    Current Medications: Current Meds  Medication Sig   acetaminophen (TYLENOL) 500 MG tablet Take 1,000 mg by mouth every 6 (six) hours as needed for mild pain or moderate pain (for pain.).   apixaban (ELIQUIS) 5 MG TABS  tablet Take 1 tablet (5 mg total) by mouth 2 (two) times daily.   Cholecalciferol (VITAMIN D3) 50 MCG (2000 UT) TABS Take 2,000 Units by mouth daily.   colesevelam (WELCHOL) 625 MG tablet Take 1,250 mg by mouth 2 (two) times daily with a meal.   Folic Acid (FOLATE PO) Take 1 tablet by mouth daily with lunch. Unknown strength   furosemide (LASIX) 40 MG tablet Take 1 tablet (40 mg total) by mouth daily as needed for edema (for weight gain of 3lbs overnight, 5 lbs in 1 week).   Polyethyl Glycol-Propyl Glycol (SYSTANE ULTRA OP) Place 1 drop into both eyes daily. Unknown strength   potassium chloride (KLOR-CON) 10 MEQ tablet Take 2 tablets (20 mEq total) by mouth daily as needed (take on days you take lasix).   rosuvastatin (CRESTOR) 40 MG tablet Take 1 tablet (  40 mg total) by mouth at bedtime.   valACYclovir (VALTREX) 1000 MG tablet Take 1,000 mg by mouth 2 (two) times daily as needed (for fever blisters.).    [DISCONTINUED] losartan (COZAAR) 50 MG tablet Take 50 mg by mouth daily.     Allergies:   Patient has no known allergies.   Social History   Socioeconomic History   Marital status: Married    Spouse name: Not on file   Number of children: 2   Years of education: Not on file   Highest education level: Not on file  Occupational History   Not on file  Tobacco Use   Smoking status: Former    Packs/day: 1.00    Years: 16.00    Pack years: 16.00    Types: Cigarettes    Quit date: 09/26/1972    Years since quitting: 48.5   Smokeless tobacco: Never  Vaping Use   Vaping Use: Never used  Substance and Sexual Activity   Alcohol use: Not Currently    Alcohol/week: 2.0 standard drinks    Types: 2 Glasses of wine per week   Drug use: No   Sexual activity: Not on file  Other Topics Concern   Not on file  Social History Narrative   Not on file   Social Determinants of Health   Financial Resource Strain: Not on file  Food Insecurity: Not on file  Transportation Needs: Not on file   Physical Activity: Not on file  Stress: Not on file  Social Connections: Not on file     Family History: The patient's family history includes Cancer in his brother and sister; Cancer (age of onset: 25) in his mother; Deep vein thrombosis in his mother; Diabetes in his father and mother; Heart attack in his father and mother; Heart disease in his mother; Hyperlipidemia in his mother; Hypertension in his mother; Other in his mother and sister; Stroke in his mother; Varicose Veins in his mother. There is no history of Colon cancer or Esophageal cancer. ROS:   Please see the history of present illness.    All 14 point review of systems negative except as described per history of present illness  EKGs/Labs/Other Studies Reviewed:      Recent Labs: 04/02/2020: NT-Pro BNP 327 03/18/2021: ALT 17; B Natriuretic Peptide 187.1 03/19/2021: TSH 2.640 03/20/2021: Magnesium 1.8 03/22/2021: BUN 16; Creatinine, Ser 1.11; Hemoglobin 14.4; Platelets 129; Potassium 4.0; Sodium 139  Recent Lipid Panel    Component Value Date/Time   CHOL 159 11/21/2018 0836   TRIG 82 11/21/2018 0836   HDL 59 11/21/2018 0836   CHOLHDL 2.7 11/21/2018 0836   LDLCALC 84 11/21/2018 0836    Physical Exam:    VS:  BP (!) 168/94 (BP Location: Left Arm, Patient Position: Sitting)   Pulse (!) 55   Ht 5\' 8"  (1.727 m)   Wt 186 lb (84.4 kg)   SpO2 95%   BMI 28.28 kg/m     Wt Readings from Last 3 Encounters:  03/26/21 186 lb (84.4 kg)  03/22/21 181 lb (82.1 kg)  03/02/21 188 lb (85.3 kg)     GEN:  Well nourished, well developed in no acute distress HEENT: Normal NECK: No JVD; No carotid bruits LYMPHATICS: No lymphadenopathy CARDIAC: RRR, no murmurs, no rubs, no gallops RESPIRATORY:  Clear to auscultation without rales, wheezing or rhonchi  ABDOMEN: Soft, non-tender, non-distended MUSCULOSKELETAL:  No edema; No deformity  SKIN: Warm and dry LOWER EXTREMITIES: no swelling NEUROLOGIC:  Alert and oriented  x  3 PSYCHIATRIC:  Normal affect   ASSESSMENT:    1. Intractable headache, unspecified chronicity pattern, unspecified headache type   2. Anticoagulated   3. Cerebrovascular accident (CVA), unspecified mechanism ([place])   4. Coronary artery disease involving native coronary artery of native heart without angina pectoris   5. First degree AV block   6. Right bundle branch block   7. Paroxysmal atrial flutter (HCC)    PLAN:    In order of problems listed above:  Headache he is on anticoagulation of course concern is about having some intracranial pathology.  I will ask him to have CT of his head today to rule out any significant bleed. Coronary artery disease only mild based on cardiac catheterization just a month ago continue risk factors modifications First-degree AV block.  Noted on the EKG with right bundle branch block is a chronic findings. History of paroxysmal atrial flutter just few days ago he required electrical cardioversion since that time he seems to be doing well.  EKG today showed sinus rhythm. Overall is a constellation of some atypical symptoms we will do CT make sure there is no significant intracranial pathology I will also increase the dose of his losartan 100 mg daily I will check his Chem-7 next week to make sure he can tolerate this dose with no problems   Medication Adjustments/Labs and Tests Ordered: Current medicines are reviewed at length with the patient today.  Concerns regarding medicines are outlined above.  Orders Placed This Encounter  Procedures   CT Head Wo Contrast   Basic metabolic panel   EKG [id number]   Medication changes:  Meds ordered this encounter  Medications   losartan (COZAAR) 100 MG tablet    Sig: Take 1 tablet (100 mg total) by mouth daily.    Dispense:  90 tablet    Refill:  1    Signed, Park Liter, MD, [facility] 03/26/2021 3:44 PM    [facility]

## 2021-03-26 NOTE — Telephone Encounter (Signed)
   Pt had head CT earlier today @ [facility] and missed call from our [place] office /w result.  I reviewed his chart.  I cannot pull up the CT report but note that prior phone note indicates that the study was normal.  Caller verbalized understanding and was grateful for the call back.  Murray Hodgkins, NP 03/26/2021, 7:15 PM

## 2021-03-31 ENCOUNTER — Other Ambulatory Visit (HOSPITAL_COMMUNITY): Payer: Self-pay

## 2021-03-31 ENCOUNTER — Telehealth (HOSPITAL_COMMUNITY): Payer: Self-pay

## 2021-03-31 DIAGNOSIS — R4189 Other symptoms and signs involving cognitive functions and awareness: Secondary | ICD-10-CM | POA: Insufficient documentation

## 2021-03-31 DIAGNOSIS — G3184 Mild cognitive impairment, so stated: Secondary | ICD-10-CM

## 2021-03-31 DIAGNOSIS — I5032 Chronic diastolic (congestive) heart failure: Secondary | ICD-10-CM

## 2021-03-31 HISTORY — DX: Chronic diastolic (congestive) heart failure: I50.32

## 2021-03-31 HISTORY — DX: Mild cognitive impairment of uncertain or unknown etiology: G31.84

## 2021-03-31 NOTE — Telephone Encounter (Signed)
Pharmacy Transitions of Care Follow-up Telephone Call  Date of discharge: 03/22/21  Discharge Diagnosis: Aflutter  How have you been since you were released from the hospital? Patient doing well, only had to use lasix twice since discharge.   Medication changes made at discharge:     START taking: Eliquis (apixaban)   CHANGE how you take: furosemide (LASIX)  potassium chloride (KLOR-CON)   STOP taking: aspirin EC 81 MG tablet  clopidogrel 75 MG tablet (PLAVIX)   Medication changes verified by the patient?  Yes    Medication Accessibility:  Home Pharmacy: [facility]   Was the patient provided with refills on discharged medications? Yes   Have all prescriptions been transferred from [facility] to home pharmacy? Yes   Is the patient able to afford medications? Patient has [employer]    Medication Review:  APIXABAN (ELIQUIS)  Apixaban 5 mg BID initiated on 03/22/21.  - Discussed importance of taking medication around the same time everyday  - Reviewed potential DDIs with patient  - Advised patient of medications to avoid (NSAIDs, ASA)  - Educated that Tylenol (acetaminophen) will be the preferred analgesic to prevent risk of bleeding  - Emphasized importance of monitoring for signs and symptoms of bleeding (abnormal bruising, prolonged bleeding, nose bleeds, bleeding from gums, discolored urine, black tarry stools)  - Advised patient to alert all providers of anticoagulation therapy prior to starting a new medication or having a procedure   Follow-up Appointments:  PCP Hospital f/u appt confirmed? Dr. Bea Graff on 03/31/21 in Internal Medicine  [facility] f/u appt confirmed? Yes Scheduled to see [name] on 05/06/21 @ Cardiology. Going for additional blood work 04/02/21  If their condition worsens, is the pt aware to call PCP or go to the Emergency Dept.? yes  Final Patient Assessment: Patient doing well, has follow up scheduled and refills at  home pharmacy.

## 2021-04-03 LAB — BASIC METABOLIC PANEL
BUN/Creatinine Ratio: 11 (ref 10–24)
BUN: 10 mg/dL (ref 8–27)
CO2: 25 mmol/L (ref 20–29)
Calcium: 8.9 mg/dL (ref 8.6–10.2)
Chloride: 105 mmol/L (ref 96–106)
Creatinine, Ser: 0.89 mg/dL (ref 0.76–1.27)
Glucose: 123 mg/dL — ABNORMAL HIGH (ref 65–99)
Potassium: 4.1 mmol/L (ref 3.5–5.2)
Sodium: 142 mmol/L (ref 134–144)
eGFR: 86 mL/min/{1.73_m2} (ref >59)

## 2021-04-12 ENCOUNTER — Ambulatory Visit: Payer: Medicare Other | Admitting: Cardiology

## 2021-05-04 ENCOUNTER — Telehealth: Payer: Self-pay | Admitting: Cardiology

## 2021-05-04 ENCOUNTER — Emergency Department (HOSPITAL_COMMUNITY): Payer: Medicare Other

## 2021-05-04 ENCOUNTER — Inpatient Hospital Stay (HOSPITAL_COMMUNITY)
Admission: EM | Admit: 2021-05-04 | Discharge: 2021-05-06 | DRG: 177 | Disposition: A | Discharge status: Home / Self Care | Payer: Medicare Other | Attending: Family Medicine | Admitting: Family Medicine

## 2021-05-04 DIAGNOSIS — Z85118 Personal history of other malignant neoplasm of bronchus and lung: Secondary | ICD-10-CM

## 2021-05-04 DIAGNOSIS — E538 Deficiency of other specified B group vitamins: Secondary | ICD-10-CM | POA: Diagnosis present

## 2021-05-04 DIAGNOSIS — U071 COVID-19: Secondary | ICD-10-CM | POA: Diagnosis not present

## 2021-05-04 DIAGNOSIS — R600 Localized edema: Secondary | ICD-10-CM

## 2021-05-04 DIAGNOSIS — N189 Chronic kidney disease, unspecified: Secondary | ICD-10-CM | POA: Insufficient documentation

## 2021-05-04 DIAGNOSIS — Z79899 Other long term (current) drug therapy: Secondary | ICD-10-CM

## 2021-05-04 DIAGNOSIS — G459 Transient cerebral ischemic attack, unspecified: Secondary | ICD-10-CM

## 2021-05-04 DIAGNOSIS — E785 Hyperlipidemia, unspecified: Secondary | ICD-10-CM | POA: Diagnosis present

## 2021-05-04 DIAGNOSIS — I459 Conduction disorder, unspecified: Secondary | ICD-10-CM | POA: Diagnosis present

## 2021-05-04 DIAGNOSIS — I440 Atrioventricular block, first degree: Secondary | ICD-10-CM | POA: Diagnosis present

## 2021-05-04 DIAGNOSIS — Z833 Family history of diabetes mellitus: Secondary | ICD-10-CM

## 2021-05-04 DIAGNOSIS — Z8673 Personal history of transient ischemic attack (TIA), and cerebral infarction without residual deficits: Secondary | ICD-10-CM

## 2021-05-04 DIAGNOSIS — Z83438 Family history of other disorder of lipoprotein metabolism and other lipidemia: Secondary | ICD-10-CM

## 2021-05-04 DIAGNOSIS — Z87891 Personal history of nicotine dependence: Secondary | ICD-10-CM

## 2021-05-04 DIAGNOSIS — G9341 Metabolic encephalopathy: Secondary | ICD-10-CM | POA: Diagnosis present

## 2021-05-04 DIAGNOSIS — I2510 Atherosclerotic heart disease of native coronary artery without angina pectoris: Secondary | ICD-10-CM | POA: Diagnosis present

## 2021-05-04 DIAGNOSIS — I130 Hypertensive heart and chronic kidney disease with heart failure and stage 1 through stage 4 chronic kidney disease, or unspecified chronic kidney disease: Secondary | ICD-10-CM | POA: Diagnosis present

## 2021-05-04 DIAGNOSIS — I4510 Unspecified right bundle-branch block: Secondary | ICD-10-CM | POA: Diagnosis present

## 2021-05-04 DIAGNOSIS — G9340 Encephalopathy, unspecified: Secondary | ICD-10-CM | POA: Diagnosis present

## 2021-05-04 DIAGNOSIS — I483 Typical atrial flutter: Secondary | ICD-10-CM | POA: Diagnosis present

## 2021-05-04 DIAGNOSIS — I639 Cerebral infarction, unspecified: Secondary | ICD-10-CM | POA: Insufficient documentation

## 2021-05-04 DIAGNOSIS — R001 Bradycardia, unspecified: Secondary | ICD-10-CM | POA: Diagnosis present

## 2021-05-04 DIAGNOSIS — Z8249 Family history of ischemic heart disease and other diseases of the circulatory system: Secondary | ICD-10-CM

## 2021-05-04 DIAGNOSIS — C801 Malignant (primary) neoplasm, unspecified: Secondary | ICD-10-CM | POA: Insufficient documentation

## 2021-05-04 DIAGNOSIS — N1831 Chronic kidney disease, stage 3a: Secondary | ICD-10-CM | POA: Diagnosis present

## 2021-05-04 DIAGNOSIS — Z7901 Long term (current) use of anticoagulants: Secondary | ICD-10-CM

## 2021-05-04 DIAGNOSIS — R479 Unspecified speech disturbances: Secondary | ICD-10-CM | POA: Diagnosis not present

## 2021-05-04 DIAGNOSIS — Z823 Family history of stroke: Secondary | ICD-10-CM

## 2021-05-04 DIAGNOSIS — R7303 Prediabetes: Secondary | ICD-10-CM | POA: Diagnosis present

## 2021-05-04 DIAGNOSIS — I5032 Chronic diastolic (congestive) heart failure: Secondary | ICD-10-CM | POA: Diagnosis present

## 2021-05-04 DIAGNOSIS — I700 Atherosclerosis of aorta: Secondary | ICD-10-CM | POA: Diagnosis present

## 2021-05-04 DIAGNOSIS — Z808 Family history of malignant neoplasm of other organs or systems: Secondary | ICD-10-CM

## 2021-05-04 DIAGNOSIS — I480 Paroxysmal atrial fibrillation: Secondary | ICD-10-CM | POA: Diagnosis present

## 2021-05-04 DIAGNOSIS — I739 Peripheral vascular disease, unspecified: Secondary | ICD-10-CM | POA: Diagnosis present

## 2021-05-04 LAB — COMPREHENSIVE METABOLIC PANEL
ALT: 13 U/L (ref 0–44)
AST: 21 U/L (ref 15–41)
Albumin: 3.2 g/dL — ABNORMAL LOW (ref 3.5–5.0)
Alkaline Phosphatase: 40 U/L (ref 38–126)
Anion gap: 6 (ref 5–15)
BUN: 13 mg/dL (ref 8–23)
CO2: 26 mmol/L (ref 22–32)
Calcium: 8.5 mg/dL — ABNORMAL LOW (ref 8.9–10.3)
Chloride: 105 mmol/L (ref 98–111)
Creatinine, Ser: 0.99 mg/dL (ref 0.61–1.24)
GFR, Estimated: >60 mL/min (ref >60)
Glucose, Bld: 108 mg/dL — ABNORMAL HIGH (ref 70–99)
Potassium: 3.9 mmol/L (ref 3.5–5.1)
Sodium: 137 mmol/L (ref 135–145)
Total Bilirubin: 0.8 mg/dL (ref 0.3–1.2)
Total Protein: 5.8 g/dL — ABNORMAL LOW (ref 6.5–8.1)

## 2021-05-04 LAB — RAPID URINE DRUG SCREEN, HOSP PERFORMED
Amphetamines: NOT DETECTED
Barbiturates: NOT DETECTED
Benzodiazepines: NOT DETECTED
Cocaine: NOT DETECTED
Opiates: NOT DETECTED
Tetrahydrocannabinol: NOT DETECTED

## 2021-05-04 LAB — CBC
HCT: 44.5 % (ref 39.0–52.0)
Hemoglobin: 14.7 g/dL (ref 13.0–17.0)
MCH: 30.7 pg (ref 26.0–34.0)
MCHC: 33 g/dL (ref 30.0–36.0)
MCV: 92.9 fL (ref 80.0–100.0)
Platelets: 122 10*3/uL — ABNORMAL LOW (ref 150–400)
RBC: 4.79 MIL/uL (ref 4.22–5.81)
RDW: 13.8 % (ref 11.5–15.5)
WBC: 8.8 10*3/uL (ref 4.0–10.5)
nRBC: 0 % (ref 0.0–0.2)

## 2021-05-04 LAB — URINALYSIS, ROUTINE W REFLEX MICROSCOPIC
Bacteria, UA: NONE SEEN
Bilirubin Urine: NEGATIVE
Glucose, UA: NEGATIVE mg/dL
Ketones, ur: 5 mg/dL — AB
Leukocytes,Ua: NEGATIVE
Nitrite: NEGATIVE
Protein, ur: NEGATIVE mg/dL
Specific Gravity, Urine: >1.046 — ABNORMAL HIGH (ref 1.005–1.030)
pH: 6 (ref 5.0–8.0)

## 2021-05-04 LAB — DIFFERENTIAL
Abs Immature Granulocytes: 0.02 10*3/uL (ref 0.00–0.07)
Basophils Absolute: 0 10*3/uL (ref 0.0–0.1)
Basophils Relative: 0 %
Eosinophils Absolute: 0 10*3/uL (ref 0.0–0.5)
Eosinophils Relative: 0 %
Immature Granulocytes: 0 %
Lymphocytes Relative: 8 %
Lymphs Abs: 0.7 10*3/uL (ref 0.7–4.0)
Monocytes Absolute: 1 10*3/uL (ref 0.1–1.0)
Monocytes Relative: 12 %
Neutro Abs: 7 10*3/uL (ref 1.7–7.7)
Neutrophils Relative %: 80 %

## 2021-05-04 LAB — I-STAT CHEM 8, ED
BUN: 13 mg/dL (ref 8–23)
Calcium, Ion: 1.14 mmol/L — ABNORMAL LOW (ref 1.15–1.40)
Chloride: 104 mmol/L (ref 98–111)
Creatinine, Ser: 0.9 mg/dL (ref 0.61–1.24)
Glucose, Bld: 106 mg/dL — ABNORMAL HIGH (ref 70–99)
HCT: 45 % (ref 39.0–52.0)
Hemoglobin: 15.3 g/dL (ref 13.0–17.0)
Potassium: 3.9 mmol/L (ref 3.5–5.1)
Sodium: 139 mmol/L (ref 135–145)
TCO2: 26 mmol/L (ref 22–32)

## 2021-05-04 LAB — LIPASE, BLOOD: Lipase: 23 U/L (ref 11–51)

## 2021-05-04 LAB — TROPONIN I (HIGH SENSITIVITY)
Troponin I (High Sensitivity): 12 ng/L (ref <18)
Troponin I (High Sensitivity): 15 ng/L (ref <18)

## 2021-05-04 LAB — PROTIME-INR
INR: 1.2 (ref 0.8–1.2)
Prothrombin Time: 15.4 seconds — ABNORMAL HIGH (ref 11.4–15.2)

## 2021-05-04 LAB — RESP PANEL BY RT-PCR (FLU A&B, COVID) ARPGX2
Influenza A by PCR: NEGATIVE
Influenza B by PCR: NEGATIVE
SARS Coronavirus 2 by RT PCR: POSITIVE — AB

## 2021-05-04 LAB — ETHANOL: Alcohol, Ethyl (B): <10 mg/dL (ref <10)

## 2021-05-04 LAB — CBG MONITORING, ED: Glucose-Capillary: 109 mg/dL — ABNORMAL HIGH (ref 70–99)

## 2021-05-04 LAB — APTT: aPTT: 28 seconds (ref 24–36)

## 2021-05-04 MED ORDER — SODIUM CHLORIDE 0.9 % IV BOLUS
500.0000 mL | Freq: Once | INTRAVENOUS | Status: AC
  Administered 2021-05-04: 500 mL via INTRAVENOUS

## 2021-05-04 MED ORDER — IOHEXOL 350 MG/ML SOLN
75.0000 mL | Freq: Once | INTRAVENOUS | Status: AC | PRN
  Administered 2021-05-04: 65 mL via INTRAVENOUS

## 2021-05-04 MED ORDER — SODIUM CHLORIDE 0.9 % IV SOLN
100.0000 mL/h | INTRAVENOUS | Status: DC
  Administered 2021-05-04 – 2021-05-06 (×2): 100 mL/h via INTRAVENOUS

## 2021-05-04 NOTE — ED Triage Notes (Signed)
Pt BIB [facility], called out for severe RLQ/flank pain that is firm to touch. Pain started 3 days ago. Pt previously drank scotch until about 6 months ago. On EMS monitor pt was in first degree heart block with a rate of 42. Pt given 0.5 of atropine, rate up to 51. Pt had an ablation 3 weeks ago for atrial flutter, possible pacemaker implantation pending.

## 2021-05-04 NOTE — Telephone Encounter (Signed)
Pt c/o swelling: STAT is pt has developed SOB within 24 hours  If swelling, where is the swelling located? legs  How much weight have you gained and in what time span? Not sure   Have you gained 3 pounds in a day or 5 pounds in a week? yes  Do you have a log of your daily weights (if so, list)? no  Are you currently taking a fluid pill? yes  Are you currently SOB? Sometimes   Have you traveled recently? No   Pts wife states that hes not able to sit up, his legs are swollen.[name]

## 2021-05-04 NOTE — ED Notes (Signed)
Patient transported to MRI 

## 2021-05-04 NOTE — ED Notes (Signed)
Daughter Lorre Nick [phone] would like an update

## 2021-05-04 NOTE — Telephone Encounter (Signed)
FYI Spoke with Chrys Racer who states that they have called 911 and EMS arrived at their home while on the phone. Advised to callback for any additional concerns.

## 2021-05-04 NOTE — Consult Note (Signed)
Neurology Consultation  Reason for Consult: Speech difficulty Referring Physician: Dr. Tomi Bamberger  CC: Speech difficulty  History is obtained from:, Chart  HPI: Louis Barton is a 80 y.o. male past medical history of BPPV, coronary artery disease, dyslipidemia, hypertension stage Ia left lung cancer, prior history of stroke, atrial flutter on Eliquis with a recent cardioversion, presented to the emergency room for complaints of abdominal pain in the right lower quadrant/flank. He was noted to be somewhat bradycardic, given 0.5 of atropine and rate went up to the 50s.  When the EDP assessed him, he started having some word finding difficulty having some difficulty with naming as well as making long sentences as well as fluency. A code stroke was called because of his history of stroke, risk factors and sudden onset of speech difficulty. On my initial evaluation, NIH stroke scale was 0. Noncontrasted head CT was completed stat-see details below   LKW: 5:20 PM today tpa given?: no, low NIH Premorbid modified Rankin scale (mRS): 0  ROS: Full ROS was performed and is negative except as noted in the HPI.  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 On imaging;   Cramps of left lower extremity-Calf  > right calf 01/01/2015   Degeneration of lumbar intervertebral  disc 11/16/2015   Formatting of this note might be different from the original. signficant DDD present with vacuum effect L4-5 and L5-S1 flat discs and anterior spurring noted   Dizziness 05/06/2020   Dyslipidemia 03/18/2015   Dyspnea on exertion 04/02/2020   Ear itch 05/14/2018   Edema 11/16/2015   Edema of both lower extremities due to peripheral venous insufficiency 02/03/2020   First degree AV block 10/20/2020   History of hiatal hernia    seen on last scan- as slight    History of thrombocytopenia    History of TIAs    Hyperlipidemia    Hypertension    Irritable bowel syndrome 11/17/2016   rec trial of citrucel/ diet 11/16/2016 >>>      Local edema 02/07/2018   Malaise and fatigue 12/08/2020   Malignant neoplasm of left lung ([place]) 12/08/2017   Stage 1A. [place]. And Dr. 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 Renal parapelvic cysts 10/06/16 on CT , right kidney midpole 1.2 cm, right kidney anterior pole 1.1 cm, and to the left kidney lower pole anterior position 3.5cm all felt to be benign   Right bundle branch block 10/20/2020   Right lower lobe pulmonary  nodule 11/09/2017   Shingles    internal- obstruction of bowels & gallbladder   Solitary pulmonary nodule 11/16/2016   CT ABd 03/24/09 linerar densities in RLL and RML c/w scarring o/w clear CT Abd 10/20/16 c/w 7 mm nodule  - CT chest 02/13/2017 :  slt enlarged to 9 mm   - PET 04/04/17 :1. These slowly enlarging 9 mm right lower lobe pulmonary nodule along the right hemidiaphragm does not appear hypermetabolic today. Location adjacent to the hemidiaphragm can cause false negatives due to motion artifact related to the   Stage 3a chronic kidney disease ([place])  06/17/2020   Stroke ([place])    Swelling of limb-Left foot 01/01/2015   Vitamin B12 deficiency 06/21/2017     Family History  Problem Relation Age of Onset   Cancer Mother 75       Breast   Stroke Mother    Diabetes Mother    Hyperlipidemia Mother    Other Mother        varicose veins   Heart attack Mother    Heart disease Mother        Left ankle swelling   Hypertension Mother    Deep vein thrombosis Mother    Varicose Veins Mother    Diabetes Father    Heart attack Father    Other Sister        Tumor in Lung and Brain   Cancer Sister        Tumor   Lung  and  Brain   Cancer Brother        BCC-SCC-Merkle Cell   Colon cancer Neg Hx    Esophageal cancer Neg Hx    Social History:   reports that he quit smoking about 48 years ago. His smoking use included cigarettes. He has a 16.00 pack-year smoking history. He has never used smokeless tobacco. He reports previous alcohol use of about 2.0 standard drinks of alcohol per week. He reports that he does not use drugs.  Medications  Current Facility-Administered Medications:    sodium chloride 0.9 % bolus 500 mL, 500 mL, Intravenous, Once **FOLLOWED BY** 0.9 %  sodium chloride infusion, 100 mL/hr, Intravenous, Continuous, Dorie Rank, MD  Current Outpatient Medications:    acetaminophen (TYLENOL) 500 MG tablet, Take 1,000 mg by mouth every 6 (six) hours as needed for mild pain or moderate pain (for pain.)., Disp: , Rfl:    apixaban (ELIQUIS) 5 MG TABS tablet, Take 1 tablet (5 mg total) by mouth 2 (two) times daily., Disp: 180 tablet, Rfl: 3   Cholecalciferol (VITAMIN D3) 50 MCG (2000 UT) TABS, Take 2,000 Units by mouth daily., Disp: , Rfl:    colesevelam (WELCHOL) 625 MG tablet, Take 1,250 mg by mouth 2 (two) times daily with a meal., Disp: , Rfl:    Folic Acid (FOLATE PO), Take 1 tablet by mouth daily with lunch. Unknown strength, Disp: , Rfl:    furosemide (LASIX) 40 MG tablet, Take 1 tablet (40 mg total) by mouth daily as needed for  edema (for weight gain of 3lbs overnight, 5 lbs in 1 week)., Disp: 90 tablet, Rfl: 3   losartan (COZAAR) 100 MG tablet, Take 1 tablet (100 mg total) by mouth daily., Disp: 90 tablet, Rfl: 1   Polyethyl Glycol-Propyl Glycol (SYSTANE ULTRA OP), Place 1 drop into both eyes daily. Unknown strength, Disp: , Rfl:    potassium chloride (KLOR-CON) 10 MEQ tablet, Take 2 tablets (20 mEq total) by mouth daily as needed (take on  days you take lasix)., Disp: 180 tablet, Rfl: 3   rosuvastatin (CRESTOR) 40 MG tablet, Take 1 tablet (40 mg total) by mouth at bedtime., Disp: , Rfl:    valACYclovir (VALTREX) 1000 MG tablet, Take 1,000 mg by mouth 2 (two) times daily as needed (for fever blisters.). , Disp: , Rfl:    Exam: Current vital signs: BP (!) 139/54   Pulse (!) 44   Temp 97.6 F (36.4 C) (Oral)   Resp (!) 23   SpO2 93%  Vital signs in last 24 hours: Temp:  [97.6 F (36.4 C)] 97.6 F (36.4 C) (08/09 1900) Pulse Rate:  [44-47] 44 (08/09 1915) Resp:  [20-26] 23 (08/09 1915) BP: (139-143)/(54-66) 139/54 (08/09 1915) SpO2:  [93 %-99 %] 93 % (08/09 1915) General: Awake alert in no distress HEENT: Normocephalic/atraumatic Lungs: Clear CVS: Bradycardic Abdomen with some flank tenderness Extremities warm well perfused Neurological exam Awake alert oriented x3 Speech is not dysarthric He did have a somewhat of a hesitancy naming some simple objects such as a watchband but repetition, comprehension and fluency otherwise were intact. Cranial nerves II to XII intact Motor examination with no drift in any of the 4 extremities Sensation intact to light touch without extinction Coordination with no dysmetria NIH stroke scale-1 for ?Aphasia  Labs I have reviewed labs in epic and the results pertinent to this consultation are:   CBC    Component Value Date/Time   WBC 8.8 05/04/2021 1821   RBC 4.79 05/04/2021 1821   HGB 15.3 05/04/2021 1828   HGB 15.7 01/19/2021 1200   HCT 45.0 05/04/2021 1828    HCT 47.1 01/19/2021 1200   PLT 122 (L) 05/04/2021 1821   PLT 149 (L) 01/19/2021 1200   MCV 92.9 05/04/2021 1821   MCV 93 01/19/2021 1200   MCH 30.7 05/04/2021 1821   MCHC 33.0 05/04/2021 1821   RDW 13.8 05/04/2021 1821   RDW 12.8 01/19/2021 1200   LYMPHSABS 0.7 05/04/2021 1821   MONOABS 1.0 05/04/2021 1821   EOSABS 0.0 05/04/2021 1821   BASOSABS 0.0 05/04/2021 1821    CMP     Component Value Date/Time   NA 139 05/04/2021 1828   NA 142 04/02/2021 1507   K 3.9 05/04/2021 1828   CL 104 05/04/2021 1828   CO2 25 04/02/2021 1507   GLUCOSE 106 (H) 05/04/2021 1828   BUN 13 05/04/2021 1828   BUN 10 04/02/2021 1507   CREATININE 0.90 05/04/2021 1828   CREATININE 0.90 08/06/2020 0922   CALCIUM 8.9 04/02/2021 1507   PROT 6.4 (L) 03/18/2021 1816   PROT 6.7 11/21/2018 0836   ALBUMIN 3.6 03/18/2021 1816   ALBUMIN 4.0 11/21/2018 0836   AST 21 03/18/2021 1816   AST 23 08/06/2020 0922   ALT 17 03/18/2021 1816   ALT 16 08/06/2020 0922   ALKPHOS 44 03/18/2021 1816   BILITOT 0.8 03/18/2021 1816   BILITOT 0.9 08/06/2020 0922   GFRNONAA >60 03/22/2021 0402   GFRNONAA >60 08/06/2020 0922   GFRAA 89 04/02/2020 0959    Lipid Panel     Component Value Date/Time   CHOL 159 11/21/2018 0836   TRIG 82 11/21/2018 0836   HDL 59 11/21/2018 0836   CHOLHDL 2.7 11/21/2018 0836   LDLCALC 84 11/21/2018 0836     Imaging I have reviewed the images obtained:  CT-head-aspects 10. CTA head and neck: With no emergent large vessel occlusion.  Occlusion of the nondominant left vertebral artery at origin with nonopacification in the  neck.  Bilateral carotid bifurcation atherosclerosis without greater than 50% stenosis.  MRI examination of the brain  Assessment:  80 year old with above past medical history presenting for evaluation abdominal pain and was noted to have some word finding difficulty/hesitation with speech. Exam largely reassuring Noncontrasted head CT and CTA head and neck unremarkable  for acute process or emergent LVO Low NIH and the fact that he is on Eliquis hence not a candidate for IV tPA. This could be a TIA due to hypoperfusion in the setting of bradycardia. He is adequately treated with Eliquis. His most recent 2D echocardiogram from June did not show any evidence of a thrombus. I think he needs an MRI to ensure that there is no acute stroke, and if that is the case, he does not need any further work-up from a neurological standpoint.   Impression: TIA versus hypoperfusion involving the left hemisphere in the setting of bradycardia  Recommendations: MRI brain without contrast If positive for stroke-we will need full stroke work-up but if negative for stroke, no further stroke work-up needed. Continue Eliquis-there is no evidence of a large stroke on the CT head and no LVO. Management of abdominal pain per primary team as you are Check UA, chest x-ray, drug screen Plan discussed in detail with Dr. Tomi Bamberger at the patient's bedside  -- Amie Portland, MD Neurologist [facility] Pager: [phone]

## 2021-05-04 NOTE — ED Provider Notes (Signed)
[facility] EMERGENCY DEPARTMENT Provider Note   CSN: [id number] Arrival date & time: 05/04/21  1800  An emergency department physician performed an initial assessment on this suspected stroke patient at 1820.  History Chief Complaint  Patient presents with   Bradycardia   Abdominal Pain    Louis Barton is a 80 y.o. male.   Abdominal Pain  Patient presents to the ED for evaluation of weakness as well as abdominal pain and bradycardia.  History obtained from the patient and his wife.  She states patient had significant weakness starting earlier today.  He was having difficulty even sitting up.  Patient complains of having some mild pain in the right lower abdomen.  He denies any vomiting or diarrhea.  No fevers.  Patient has history of cardioversion procedure back in June of this year.  He also has subsequently had episodes of bradycardia down into the 30s.  Patient apparently was seen at an urgent care today because of some issues with sore throat cough headache congestion.  He also has noticed some hoarseness.  While the patient was in the ED he started experiencing some confusion and difficulty finding words.  Patient indicated the symptoms only started on the way to the ED.  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 signficant DDD present with vacuum effect L4-5 and L5-S1 flat discs and anterior spurring noted   Dizziness 05/06/2020   Dyslipidemia 03/18/2015   Dyspnea on exertion 04/02/2020   Ear itch 05/14/2018   Edema 11/16/2015   Edema of both lower extremities due to peripheral venous insufficiency 02/03/2020   First degree AV block 10/20/2020   History of hiatal hernia    seen on last scan- as slight    History of thrombocytopenia    History of TIAs    Hyperlipidemia    Hypertension    Irritable bowel syndrome 11/17/2016   rec trial of citrucel/ diet 11/16/2016 >>>      Local edema 02/07/2018   Malaise and fatigue 12/08/2020   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 [place]. And Dr. 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 Renal parapelvic cysts 10/06/16 on CT , right kidney midpole 1.2 cm, right kidney anterior pole 1.1 cm, and  to the left kidney lower pole anterior position 3.5cm all felt to be benign   Right bundle branch block 10/20/2020   Right lower lobe pulmonary nodule 11/09/2017   Shingles    internal- obstruction of bowels & gallbladder   Solitary pulmonary nodule 11/16/2016   CT ABd 03/24/09 linerar densities in RLL and RML c/w scarring o/w clear CT Abd 10/20/16 c/w 7 mm nodule  - CT chest 02/13/2017 :  slt enlarged to 9 mm   - PET 04/04/17 :1. These slowly enlarging 9 mm right lower lobe pulmonary nodule along the right hemidiaphragm does not appear hypermetabolic today. 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   Stroke ([place])    Swelling of limb-Left foot 01/01/2015   Vitamin B12 deficiency 06/21/2017    Patient Active Problem List   Diagnosis Date Noted   Paroxysmal atrial flutter ([place]) 03/19/2021   Hypertension    History of TIAs    History of hiatal hernia    Carotid stenosis    CAD (coronary artery disease)    Arthritis    Abnormal stress test    Malaise and fatigue 12/08/2020   Right bundle branch block 10/20/2020   First degree AV block 10/20/2020   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   Dizziness 05/06/2020   Stroke ([place])    Shingles    PAD (peripheral artery disease) ([place])    History of thrombocytopenia    Cancer ([place])    Chronic kidney disease    Dyspnea on exertion 04/02/2020   Edema of both lower extremities due to peripheral venous insufficiency 02/03/2020   Prediabetes 12/25/2018   Ear itch 05/14/2018   Local edema 02/07/2018   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   Right lower lobe pulmonary nodule 11/09/2017   Vitamin B12 deficiency 06/21/2017   Irritable bowel syndrome 11/17/2016   Solitary pulmonary nodule 11/16/2016   Renal cysts, acquired, bilateral 10/24/2016   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   Benign paroxysmal positional vertigo 08/08/2016   Atherosclerosis 11/16/2015   Edema 11/16/2015   Coronary arteriosclerosis 11/16/2015   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 Oneida Alar   CAROTID ENDARTERECTOMY  04/25/11   Right Carotid by Dr. Oneida Alar   COLONOSCOPY  02/23/2017   Colonic polyp status post polypectomy. Pancolonic diverticulosis predominatly in the sigmoid colon. Tubular adenoma   LEFT HEART CATH AND CORONARY ANGIOGRAPHY N/A 02/08/2021   Procedure: LEFT HEART CATH AND CORONARY ANGIOGRAPHY;  Surgeon: Burnell Blanks, MD;  Location: [facility];  Service: Cardiovascular;  Laterality: N/A;   Septoplasty, nasal w/ submucosal resection  1960   SQUAMOUS CELL CARCINOMA EXCISION     pt said numerous times   TEE WITHOUT CARDIOVERSION N/A 03/22/2021   Procedure: TRANSESOPHAGEAL ECHOCARDIOGRAM (TEE);  Surgeon: Stanford Breed,  Denice Bors, MD;  Location: [facility] ENDOSCOPY;  Service: Cardiovascular;  Laterality: N/A;   TONSILLECTOMY     VIDEO ASSISTED THORACOSCOPY (VATS)/WEDGE RESECTION Right 11/09/2017   Procedure: RIGHT VIDEO ASSISTED THORACOSCOPY WITH Chales Salmon RESECTION;  Surgeon: [name], MD;  Location: [facility];  Service: Thoracic;  Laterality: Right;       Family History  Problem Relation Age of Onset   Cancer Mother 24       Breast   Stroke Mother    Diabetes Mother    Hyperlipidemia Mother    Other Mother        varicose veins   Heart attack Mother    Heart disease Mother        Left ankle swelling   Hypertension Mother    Deep vein thrombosis Mother    Varicose Veins Mother    Diabetes Father    Heart attack Father    Other Sister        Tumor in Lung and Brain   Cancer Sister        Tumor   Lung  and  Brain   Cancer Brother        BCC-SCC-Merkle Cell   Colon cancer Neg Hx    Esophageal cancer Neg Hx     Social History   Tobacco Use   Smoking status: Former    Packs/day: 1.00    Years: 16.00    Pack years: 16.00    Types: Cigarettes    Quit date: 09/26/1972    Years  since quitting: 48.6   Smokeless tobacco: Never  Vaping Use   Vaping Use: Never used  Substance Use Topics   Alcohol use: Not Currently    Alcohol/week: 2.0 standard drinks    Types: 2 Glasses of wine per week   Drug use: No    Home Medications Prior to Admission medications   Medication Sig Start Date End Date Taking? Authorizing Provider  acetaminophen (TYLENOL) 500 MG tablet Take 1,000 mg by mouth every 6 (six) hours as needed for mild pain or moderate pain (for pain.).    [provider]  apixaban (ELIQUIS) 5 MG TABS tablet Take 1 tablet (5 mg total) by mouth 2 (two) times daily. 03/22/21   Kroeger, Lorelee Cover., PA-C  Cholecalciferol (VITAMIN D3) 50 MCG (2000 UT) TABS Take 2,000 Units by mouth daily.    [provider]  colesevelam (WELCHOL) 625 MG tablet Take 1,250 mg by mouth 2 (two) times daily with a meal.    [provider]  Folic Acid (FOLATE PO) Take 1 tablet by mouth daily with lunch. Unknown strength    [provider]  furosemide (LASIX) 40 MG tablet Take 1 tablet (40 mg total) by mouth daily as needed for edema (for weight gain of 3lbs overnight, 5 lbs in 1 week). 03/22/21 06/20/21  Kroeger, Lorelee Cover., PA-C  losartan (COZAAR) 100 MG tablet Take 1 tablet (100 mg total) by mouth daily. 03/26/21   Park Liter, MD  Polyethyl Glycol-Propyl Glycol (SYSTANE ULTRA OP) Place 1 drop into both eyes daily. Unknown strength    [provider]  potassium chloride (KLOR-CON) 10 MEQ tablet Take 2 tablets (20 mEq total) by mouth daily as needed (take on days you take lasix). 03/22/21 06/20/21  Kroeger, Lorelee Cover., PA-C  rosuvastatin (CRESTOR) 40 MG tablet Take 1 tablet (40 mg total) by mouth at bedtime. 03/22/21   Kroeger, Lorelee Cover., PA-C  valACYclovir (VALTREX) 1000 MG tablet Take 1,000  mg by mouth 2 (two) times daily as needed (for fever blisters.).  05/19/17   [provider]    Allergies    Patient has no known allergies.  Review of  Systems   Review of Systems  Gastrointestinal:  Positive for abdominal pain.  All other systems reviewed and are negative.  Physical Exam Updated Vital Signs BP 122/67   Pulse (!) 42   Temp 97.6 F (36.4 C) (Oral)   Resp 17   SpO2 96%   Physical Exam Vitals and nursing note reviewed.  Constitutional:      General: He is not in acute distress.    Appearance: He is well-developed.  HENT:     Head: Normocephalic and atraumatic.     Right Ear: External ear normal.     Left Ear: External ear normal.  Eyes:     General: No scleral icterus.       Right eye: No discharge.        Left eye: No discharge.     Conjunctiva/sclera: Conjunctivae normal.  Neck:     Trachea: No tracheal deviation.  Cardiovascular:     Rate and Rhythm: Normal rate and regular rhythm.  Pulmonary:     Effort: Pulmonary effort is normal. No respiratory distress.     Breath sounds: Normal breath sounds. No stridor. No wheezing or rales.  Abdominal:     General: Bowel sounds are normal. There is no distension.     Palpations: Abdomen is soft.     Tenderness: There is no abdominal tenderness. There is no guarding or rebound.  Genitourinary:    Testes:        Right: Swelling present.        Left: Swelling present.  Musculoskeletal:        General: No tenderness.     Cervical back: Neck supple.  Skin:    General: Skin is warm and dry.     Findings: No rash.  Neurological:     Mental Status: He is alert and oriented to person, place, and time.     GCS: GCS eye subscore is 4. GCS verbal subscore is 5. GCS motor subscore is 6.     Cranial Nerves: No cranial nerve deficit (No facial droop, extraocular movements intact, tongue midline ).     Sensory: No sensory deficit.     Motor: No abnormal muscle tone or seizure activity.     Coordination: Coordination normal.     Comments: No pronator drift bilateral upper extrem, able to hold both legs off bed for 5 seconds, sensation intact in all extremities, no  visual field cuts, no left or right sided neglect, some past-pointing and slow l finger-nose exam , no nystagmus noted; difficulty forming sentences and recalling some words, was able to name objects such as watch and eyeglasses   EMS noted the patient was bradycardic with a heart rate in the 40s.  He was given 0.5 mg of atropine with improvement of his heart rate into the 50s.  ED Results / Procedures / Treatments   Labs (all labs ordered are listed, but only abnormal results are displayed) Labs Reviewed  PROTIME-INR - Abnormal; Notable for the following components:      Result Value   Prothrombin Time 15.4 (*)    All other components within normal limits  CBC - Abnormal; Notable for the following components:   Platelets 122 (*)    All other components within normal limits  COMPREHENSIVE METABOLIC PANEL -  Abnormal; Notable for the following components:   Glucose, Bld 108 (*)    Calcium 8.5 (*)    Total Protein 5.8 (*)    Albumin 3.2 (*)    All other components within normal limits  URINALYSIS, ROUTINE W REFLEX MICROSCOPIC - Abnormal; Notable for the following components:   Specific Gravity, Urine >1.046 (*)    Hgb urine dipstick SMALL (*)    Ketones, ur 5 (*)    All other components within normal limits  I-STAT CHEM 8, ED - Abnormal; Notable for the following components:   Glucose, Bld 106 (*)    Calcium, Ion 1.14 (*)    All other components within normal limits  CBG MONITORING, ED - Abnormal; Notable for the following components:   Glucose-Capillary 109 (*)    All other components within normal limits  RESP PANEL BY RT-PCR (FLU A&B, COVID) ARPGX2  ETHANOL  APTT  DIFFERENTIAL  RAPID URINE DRUG SCREEN, HOSP PERFORMED  LIPASE, BLOOD  TROPONIN I (HIGH SENSITIVITY)  TROPONIN I (HIGH SENSITIVITY)    EKG None  Radiology CT HEAD CODE STROKE WO CONTRAST  Result Date: 05/04/2021 CLINICAL DATA:  Code stroke.  Neuro deficit, acute, stroke suspected EXAM: CT HEAD WITHOUT CONTRAST  TECHNIQUE: Contiguous axial images were obtained from the base of the skull through the vertex without intravenous contrast. COMPARISON:  03/26/2021. FINDINGS: Brain: No evidence of acute large vascular territory infarction, hemorrhage, hydrocephalus, extra-axial collection or mass lesion/mass effect. Similar nonspecific mineralization in the right cerebellum. Similar generalized cerebral atrophy with ex vacuo ventricular dilation. Similar mild patchy white matter hypoattenuation, nonspecific but most likely related to chronic microvascular ischemic disease. Partially empty sella. Vascular: No hyperdense vessel identified. Skull: No acute fracture. Sinuses/Orbits: Mild paranasal sinus mucosal thickening. Small/atretic left maxillary sinus Other: No mastoid effusions. ASPECTS [facility] Stroke Program Early CT Score) Total score (0-10 with 10 being normal): 10. IMPRESSION: No evidence of acute large vascular territory infarct or acute hemorrhage. ASPECTS is 10. Code stroke imaging results were communicated on 05/04/2021 at 6:44 pm to provider Dr. Rory Percy via secure text paging. Electronically Signed   By: Margaretha Sheffield MD   On: 05/04/2021 18:44   CT ANGIO HEAD NECK W WO CM (CODE STROKE)  Result Date: 05/04/2021 CLINICAL DATA:  Stroke follow-up. EXAM: CT ANGIOGRAPHY HEAD AND NECK TECHNIQUE: Multidetector CT imaging of the head and neck was performed using the standard protocol during bolus administration of intravenous contrast. Multiplanar CT image reconstructions and MIPs were obtained to evaluate the vascular anatomy. Carotid stenosis measurements (when applicable) are obtained utilizing NASCET criteria, using the distal internal carotid diameter as the denominator. CONTRAST:  55mL OMNIPAQUE IOHEXOL 350 MG/ML SOLN COMPARISON:  None. FINDINGS: CTA NECK FINDINGS Aortic arch: Great vessel origins are patent. 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 Right carotid system: Calcific and noncalcific  tortuous atherosclerosis at the carotid bifurcation without greater than 50% stenosis. Tortuous ICA at the skull base. Left carotid system: Mild narrowing of the common carotid artery due to calcific atherosclerosis. Ulcerated calcific and noncalcific atherosclerosis at the carotid bifurcation without greater than 50% stenosis. Tortuous ICA at the skull base. Vertebral arteries: Mild stenosis of the right vertebral artery origin due to calcific atherosclerosis. Tortuous right vertebral artery without greater than 50% stenosis. Non dominant left vertebral artery is occluded at its origin with non opacification in neck. Faint reconstitution of the V3 and V4 vertebral artery, likely from collaterals. Skeleton: Moderate to severe multilevel degenerative disc disease, greatest at C3-C4,  C4-C5 and C5-C6. Other neck: No acute findings. Upper chest: Visualized lung apices are clear. Review of the MIP images confirms the above findings CTA HEAD FINDINGS Anterior circulation: Bilateral intracranial ICAs, MCAs, and ACAs are patent without proximal flow limiting stenosis. No aneurysm identified. Posterior circulation: Small left intradural vertebral artery. Right intradural vertebral artery is patent without significant stenosis. The basilar artery and bilateral posterior cerebral arteries are patent without hemodynamically significant proximal stenosis. Fetal type left PCA. Prominent right posterior communicating artery with small right P1 PCA. Small basilar artery. Venous sinuses: As permitted by contrast timing, patent. Anatomic variants: Detailed above. Review of the MIP images confirms the above findings IMPRESSION: CTA Head: No large vessel occlusion or proximal hemodynamically significant stenosis. CTA neck: 1. 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 2. Bilateral carotid bifurcation atherosclerosis without greater than 50% stenosis. 3. Moderate stenosis of the left  subclavian artery origin. Electronically Signed   By: Margaretha Sheffield MD   On: 05/04/2021 19:13    Procedures Procedures   Medications Ordered in ED Medications  sodium chloride 0.9 % bolus 500 mL (has no administration in time range)    Followed by  0.9 %  sodium chloride infusion (has no administration in time range)  iohexol (OMNIPAQUE) 350 MG/ML injection 75 mL (65 mLs Intravenous Contrast Given 05/04/21 1852)    ED Course  I have reviewed the triage vital signs and the nursing notes.  Pertinent labs & imaging results that were available during my care of the patient were reviewed by me and considered in my medical decision making (see chart for details).  Clinical Course as of 05/04/21 2233  Tue May 04, 2021  2056 CBC normal.  Metabolic panel normal. [JK]  2102 CT head without acute findings.  CT angio head without large vessel occlusion.  Abnormalities on CT angio portion of the neck noted [JK]  2103 Mental status has improved.  Patient is speaking clearly [JK]    Clinical Course User Index [JK] Dorie Rank, MD   MDM Rules/Calculators/A&P                           Patient presented to the ED for evaluation of URI type symptoms, bradycardia, weakness.  While the patient was in the ED he did have an episode of difficulty with his speech.  concerned about the possibility of stroke.  Code stroke was activated.  Appreciate Dr. Johny Chess assistance.  Patient's symptoms started to improve.  No indication for tPA.  No signs of large vessel occlusion.  Suspect symptoms are related to possible TIA.  Patient continue have bradycardia in the ED.  He has remained normotensive.  We will continue to monitor.  May benefit from cardiology consultation in the a.m.  Patient did complain of flank pain although was not having any tenderness on my exam.  Did add on a noncontrast renal CT.  Patient's COVID test is positive.  Certainly possible this could be causing some of the l flank pain myalgias.  Will  admit for TIA work-up bradycardia evaluation Final Clinical Impression(s) / ED Diagnoses Final diagnoses:  TIA (transient ischemic attack)  Bradycardia  Leg edema    Rx / DC Orders ED Discharge Orders     None        Dorie Rank, MD 05/04/21 2240

## 2021-05-05 ENCOUNTER — Observation Stay (HOSPITAL_COMMUNITY): Payer: Medicare Other

## 2021-05-05 ENCOUNTER — Telehealth: Payer: Self-pay | Admitting: Cardiology

## 2021-05-05 DIAGNOSIS — I441 Atrioventricular block, second degree: Secondary | ICD-10-CM | POA: Diagnosis not present

## 2021-05-05 DIAGNOSIS — I480 Paroxysmal atrial fibrillation: Secondary | ICD-10-CM | POA: Diagnosis present

## 2021-05-05 DIAGNOSIS — R600 Localized edema: Secondary | ICD-10-CM

## 2021-05-05 DIAGNOSIS — I5032 Chronic diastolic (congestive) heart failure: Secondary | ICD-10-CM | POA: Diagnosis present

## 2021-05-05 DIAGNOSIS — E538 Deficiency of other specified B group vitamins: Secondary | ICD-10-CM | POA: Diagnosis present

## 2021-05-05 DIAGNOSIS — I459 Conduction disorder, unspecified: Secondary | ICD-10-CM

## 2021-05-05 DIAGNOSIS — Z833 Family history of diabetes mellitus: Secondary | ICD-10-CM | POA: Diagnosis not present

## 2021-05-05 DIAGNOSIS — Z8249 Family history of ischemic heart disease and other diseases of the circulatory system: Secondary | ICD-10-CM | POA: Diagnosis not present

## 2021-05-05 DIAGNOSIS — U071 COVID-19: Secondary | ICD-10-CM | POA: Diagnosis present

## 2021-05-05 DIAGNOSIS — I739 Peripheral vascular disease, unspecified: Secondary | ICD-10-CM | POA: Diagnosis present

## 2021-05-05 DIAGNOSIS — I483 Typical atrial flutter: Secondary | ICD-10-CM | POA: Diagnosis present

## 2021-05-05 DIAGNOSIS — I4892 Unspecified atrial flutter: Secondary | ICD-10-CM

## 2021-05-05 DIAGNOSIS — Z808 Family history of malignant neoplasm of other organs or systems: Secondary | ICD-10-CM | POA: Diagnosis not present

## 2021-05-05 DIAGNOSIS — Z79899 Other long term (current) drug therapy: Secondary | ICD-10-CM | POA: Diagnosis not present

## 2021-05-05 DIAGNOSIS — E785 Hyperlipidemia, unspecified: Secondary | ICD-10-CM | POA: Diagnosis present

## 2021-05-05 DIAGNOSIS — G9341 Metabolic encephalopathy: Secondary | ICD-10-CM | POA: Diagnosis present

## 2021-05-05 DIAGNOSIS — G9340 Encephalopathy, unspecified: Secondary | ICD-10-CM | POA: Diagnosis not present

## 2021-05-05 DIAGNOSIS — Z7901 Long term (current) use of anticoagulants: Secondary | ICD-10-CM | POA: Diagnosis not present

## 2021-05-05 DIAGNOSIS — Z87891 Personal history of nicotine dependence: Secondary | ICD-10-CM | POA: Diagnosis not present

## 2021-05-05 DIAGNOSIS — R7303 Prediabetes: Secondary | ICD-10-CM | POA: Diagnosis present

## 2021-05-05 DIAGNOSIS — G459 Transient cerebral ischemic attack, unspecified: Secondary | ICD-10-CM | POA: Diagnosis present

## 2021-05-05 DIAGNOSIS — R001 Bradycardia, unspecified: Secondary | ICD-10-CM

## 2021-05-05 DIAGNOSIS — I2510 Atherosclerotic heart disease of native coronary artery without angina pectoris: Secondary | ICD-10-CM | POA: Diagnosis present

## 2021-05-05 DIAGNOSIS — I700 Atherosclerosis of aorta: Secondary | ICD-10-CM | POA: Diagnosis present

## 2021-05-05 DIAGNOSIS — I4510 Unspecified right bundle-branch block: Secondary | ICD-10-CM | POA: Diagnosis present

## 2021-05-05 DIAGNOSIS — Z83438 Family history of other disorder of lipoprotein metabolism and other lipidemia: Secondary | ICD-10-CM | POA: Diagnosis not present

## 2021-05-05 DIAGNOSIS — Z85118 Personal history of other malignant neoplasm of bronchus and lung: Secondary | ICD-10-CM | POA: Diagnosis not present

## 2021-05-05 DIAGNOSIS — Z823 Family history of stroke: Secondary | ICD-10-CM | POA: Diagnosis not present

## 2021-05-05 DIAGNOSIS — I130 Hypertensive heart and chronic kidney disease with heart failure and stage 1 through stage 4 chronic kidney disease, or unspecified chronic kidney disease: Secondary | ICD-10-CM | POA: Diagnosis present

## 2021-05-05 DIAGNOSIS — N1831 Chronic kidney disease, stage 3a: Secondary | ICD-10-CM | POA: Diagnosis present

## 2021-05-05 DIAGNOSIS — Z8673 Personal history of transient ischemic attack (TIA), and cerebral infarction without residual deficits: Secondary | ICD-10-CM | POA: Diagnosis not present

## 2021-05-05 HISTORY — DX: Bradycardia, unspecified: R00.1

## 2021-05-05 HISTORY — DX: COVID-19: U07.1

## 2021-05-05 HISTORY — DX: Encephalopathy, unspecified: G93.40

## 2021-05-05 HISTORY — DX: Conduction disorder, unspecified: I45.9

## 2021-05-05 LAB — COMPREHENSIVE METABOLIC PANEL
ALT: 13 U/L (ref 0–44)
AST: 19 U/L (ref 15–41)
Albumin: 3 g/dL — ABNORMAL LOW (ref 3.5–5.0)
Alkaline Phosphatase: 34 U/L — ABNORMAL LOW (ref 38–126)
Anion gap: 9 (ref 5–15)
BUN: 11 mg/dL (ref 8–23)
CO2: 25 mmol/L (ref 22–32)
Calcium: 8.5 mg/dL — ABNORMAL LOW (ref 8.9–10.3)
Chloride: 104 mmol/L (ref 98–111)
Creatinine, Ser: 1.04 mg/dL (ref 0.61–1.24)
GFR, Estimated: >60 mL/min (ref >60)
Glucose, Bld: 99 mg/dL (ref 70–99)
Potassium: 4.2 mmol/L (ref 3.5–5.1)
Sodium: 138 mmol/L (ref 135–145)
Total Bilirubin: 0.5 mg/dL (ref 0.3–1.2)
Total Protein: 5.6 g/dL — ABNORMAL LOW (ref 6.5–8.1)

## 2021-05-05 LAB — CBC WITH DIFFERENTIAL/PLATELET
Abs Immature Granulocytes: 0.02 10*3/uL (ref 0.00–0.07)
Basophils Absolute: 0 10*3/uL (ref 0.0–0.1)
Basophils Relative: 0 %
Eosinophils Absolute: 0 10*3/uL (ref 0.0–0.5)
Eosinophils Relative: 0 %
HCT: 43.1 % (ref 39.0–52.0)
Hemoglobin: 14.3 g/dL (ref 13.0–17.0)
Immature Granulocytes: 0 %
Lymphocytes Relative: 19 %
Lymphs Abs: 1.4 10*3/uL (ref 0.7–4.0)
MCH: 30.9 pg (ref 26.0–34.0)
MCHC: 33.2 g/dL (ref 30.0–36.0)
MCV: 93.1 fL (ref 80.0–100.0)
Monocytes Absolute: 1.1 10*3/uL — ABNORMAL HIGH (ref 0.1–1.0)
Monocytes Relative: 15 %
Neutro Abs: 4.8 10*3/uL (ref 1.7–7.7)
Neutrophils Relative %: 66 %
Platelets: 107 10*3/uL — ABNORMAL LOW (ref 150–400)
RBC: 4.63 MIL/uL (ref 4.22–5.81)
RDW: 14.2 % (ref 11.5–15.5)
WBC: 7.4 10*3/uL (ref 4.0–10.5)
nRBC: 0 % (ref 0.0–0.2)

## 2021-05-05 LAB — APTT
aPTT: 137 seconds — ABNORMAL HIGH (ref 24–36)
aPTT: 136 seconds — ABNORMAL HIGH (ref 24–36)

## 2021-05-05 LAB — D-DIMER, QUANTITATIVE: D-Dimer, Quant: 0.33 ug/mL-FEU (ref 0.00–0.50)

## 2021-05-05 LAB — HEPARIN LEVEL (UNFRACTIONATED)
Heparin Unfractionated: >1.1 IU/mL — ABNORMAL HIGH (ref 0.30–0.70)
Heparin Unfractionated: >1.1 IU/mL — ABNORMAL HIGH (ref 0.30–0.70)

## 2021-05-05 LAB — TSH: TSH: 0.946 u[IU]/mL (ref 0.350–4.500)

## 2021-05-05 LAB — C-REACTIVE PROTEIN: CRP: 6.2 mg/dL — ABNORMAL HIGH (ref <1.0)

## 2021-05-05 LAB — MAGNESIUM: Magnesium: 1.7 mg/dL (ref 1.7–2.4)

## 2021-05-05 MED ORDER — NIRMATRELVIR/RITONAVIR (PAXLOVID)TABLET
3.0000 | ORAL_TABLET | Freq: Two times a day (BID) | ORAL | Status: DC
Start: 2021-05-05 — End: 2021-05-05

## 2021-05-05 MED ORDER — NIRMATRELVIR/RITONAVIR (PAXLOVID)TABLET
3.0000 | ORAL_TABLET | Freq: Two times a day (BID) | ORAL | Status: DC
  Administered 2021-05-06: 3 via ORAL
  Filled 2021-05-05 (×3): qty 30

## 2021-05-05 MED ORDER — ATORVASTATIN CALCIUM 80 MG PO TABS: 80.0000 mg | ORAL_TABLET | Freq: Every day | ORAL | Status: DC

## 2021-05-05 MED ORDER — ONDANSETRON HCL 4 MG PO TABS
4.0000 mg | ORAL_TABLET | Freq: Four times a day (QID) | ORAL | Status: DC | PRN
  Filled 2021-05-05: qty 1

## 2021-05-05 MED ORDER — ONDANSETRON HCL 4 MG/2ML IJ SOLN: 4.0000 mg | Freq: Four times a day (QID) | INTRAMUSCULAR | Status: DC | PRN

## 2021-05-05 MED ORDER — FUROSEMIDE 40 MG PO TABS
40.0000 mg | ORAL_TABLET | Freq: Every day | ORAL | Status: DC
  Administered 2021-05-05 – 2021-05-06 (×2): 40 mg via ORAL
  Filled 2021-05-05: qty 2
  Filled 2021-05-05: qty 1

## 2021-05-05 MED ORDER — NIRMATRELVIR/RITONAVIR (PAXLOVID)TABLET
3.0000 | ORAL_TABLET | Freq: Two times a day (BID) | ORAL | Status: DC
  Filled 2021-05-05: qty 30

## 2021-05-05 MED ORDER — HEPARIN (PORCINE) 25000 UT/250ML-% IV SOLN
900.0000 [IU]/h | INTRAVENOUS | Status: DC
  Administered 2021-05-05: 1200 [IU]/h via INTRAVENOUS
  Administered 2021-05-05: 1100 [IU]/h via INTRAVENOUS
  Filled 2021-05-05 (×2): qty 250

## 2021-05-05 MED ORDER — ACETAMINOPHEN 325 MG PO TABS
650.0000 mg | ORAL_TABLET | Freq: Four times a day (QID) | ORAL | Status: DC | PRN
  Administered 2021-05-05: 650 mg via ORAL
  Filled 2021-05-05: qty 2

## 2021-05-05 MED ORDER — POLYVINYL ALCOHOL 1.4 % OP SOLN
1.0000 [drp] | Freq: Every day | OPHTHALMIC | Status: DC
  Administered 2021-05-06: 1 [drp] via OPHTHALMIC
  Filled 2021-05-05: qty 15

## 2021-05-05 MED ORDER — ROSUVASTATIN CALCIUM 20 MG PO TABS: 40.0000 mg | ORAL_TABLET | Freq: Every day | ORAL | Status: DC

## 2021-05-05 NOTE — Progress Notes (Signed)
Patient to 2C13 from ED. Vital signs obtained. On monitor CCMD notified. CHG bath completed. Alert and oriented to room and call light. Call bell within reach.  Era Bumpers, RN

## 2021-05-05 NOTE — ED Notes (Signed)
Daughter Lorre Nick [phone] would like an update

## 2021-05-05 NOTE — Progress Notes (Signed)
PROGRESS NOTE    Louis Barton  MGQ:[id number] DOB: Jun 14, 1941 DOA: 05/04/2021 PCP: Louis Mina., MD   Chief Complaint  Patient presents with   Bradycardia   Abdominal Pain   Brief Narrative:  Louis Barton is Louis Barton 80 y.o. male with medical history significant of Louis Barton.Flutter with SVR in June s/p DCCV on eliquis, not on any nodal blocking agents due to h/o SVR and intermittent bradycardia; HTN, B CEAs.   Pt presents to ED for evaluation of generalized weakness, abd pain, bradycardia.   Symptoms onset earlier today, weakness to point of being unable to sit up.  Some mild pain in R lower abdomen.   Associated sore throat, headache, cough, congestion.   No vomiting no diarrhea no fevers.     ED Course: In the ED has now started to have some confusion and difficulty with word finding.  Ultimately code stroke called and seen by neurology.   No LVO on CTA.   MRI brain - no acute findings (may have had prior SAH at some point in past).   COVID-19 is positive.   Satting mid 90s on RA.   HR is going in and out of Zyanna Leisinger 2nd degree AV block with 2:1 conduction and rate in the 30s when it is occurring.   Assessment & Plan:   Principal Problem:   Acute encephalopathy Active Problems:   Symptomatic bradycardia   Typical atrial flutter (HCC)   Heart block   COVID-19 virus infection   Sinus bradycardia  Acute metabolic encephalopathy  Either AMS due to COVID-19 and/or intermittent heart block (looks like Rubi Tooley 2nd degree 2:1 with bradycardia in the 30s). 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  Symptomatic bradycardia  heart block with 2:1 conduction  First Degree AV Block Intermittent 2:1 block on telemetry Appreciate cardiology recommendation, note reasonable for outpatient EP eval, but no present indication for pacing - cards notes bifasicular block TSH wnl Check magnesium (wnl)  COVID-19 COVID pathway No O2 requirement currently Planned for paxlovid.  Borderline for mab, could consider this as his confusion at presentation may have been 2/2 brady vs covid (but now seems clearly to baseline).  Will discuss Brylyn Novakovich bit further with pt/pharmacy - mab would be Tayanna Talford bit easier with med interactions. Daily labs Cont pulse ox No O2 requirement yet (O2 sat occasionally <94%, but not consistently, and fluctuating, will hold off on steroids)  No remdesivir due to black box warning about remdesivir possibly causing bradycardia   COVID-19 Labs  Recent Labs    05/05/21 0357  DDIMER 0.33  CRP 6.2*    Lab Results  Component Value Date   SARSCOV2NAA POSITIVE (Cloy Cozzens) 05/04/2021   [place] NEGATIVE 03/18/2021   [place] NEGATIVE 02/05/2021    PAF - Currently going between sinus rhythm and 2nd degree HB with 2:1 conduction (no fib or flutter today). S/p DCCV in June Hold eliquis and use heparin gtt instead due to using Paxlovid for COVID-19 treatment Not on any nodal blocking agents due to h/o Praise Stennett.Flutter  with SVR.  HTN - Holding losartan  DVT prophylaxis: heparin gtt Code Status: full  Family Communication: (none at bedside Disposition:   Status is: Observation  The patient remains OBS appropriate and will d/c before 2 midnights.  Dispo: The patient is from: Home              Anticipated d/c is to: Home              Patient currently is not medically stable to d/c.   Difficult to place patient No       Consultants:   Neurology cardiology  Procedures:  none  Antimicrobials:  Anti-infectives (From admission, onward)    Start     Dose/Rate Route Frequency Ordered Stop   05/05/21 1000  nirmatrelvir/ritonavir EUA (PAXLOVID) TABS 3 tablet        3 tablet Oral 2 times daily 05/05/21 0716 05/10/21 0959   05/05/21 0200  nirmatrelvir/ritonavir EUA (PAXLOVID) TABS 3 tablet  Status:  Discontinued        3 tablet Oral 2 times daily 05/05/21 0147 05/05/21 0716          Subjective: No new complaints, asking when he can eat  Objective: Vitals:   05/05/21 1445 05/05/21 1515 05/05/21 1530 05/05/21 1624  BP: (!) 164/69 104/79 (!) 162/71 (!) 167/61  Pulse: 61 65 (!) 59 (!) 50  Resp: (!) 23 (!) 21 (!) 25 20  Temp:    (!) 97.5 F (36.4 C)  TempSrc:    Oral  SpO2: 94% 91% 94% 95%  Weight:      Height:        Intake/Output Summary (Last 24 hours) at 05/05/2021 1657 Last data filed at 05/05/2021 1603 Gross per 24 hour  Intake 1616.67 ml  Output 1625 ml  Net -8.33 ml   Filed Weights   05/05/21 0115  Weight: 84.4 kg    Examination:  General exam: Appears calm and comfortable  Respiratory system: unlabored Cardiovascular system: RRR Gastrointestinal system: Abdomen is nondistended, soft and nontender.  Central nervous system: Alert and oriented. CN 2-12 intact.  No focal neurological deficits. Extremities: no LEE Skin: No rashes, lesions or ulcers Psychiatry: Judgement and insight appear normal. Mood & affect appropriate.     Data Reviewed: I have personally reviewed following labs and imaging studies  CBC: Recent Labs  Lab 05/04/21 1821 05/04/21 1828 05/05/21 0357  WBC 8.8  --  7.4  NEUTROABS 7.0  --  4.8  HGB 14.7 15.3 14.3  HCT 44.5 45.0 43.1  MCV 92.9  --  93.1  PLT 122*  --  107*    Basic Metabolic Panel: Recent Labs  Lab 05/04/21 1821 05/04/21 1828 05/05/21 0357  NA 137 139 138  K 3.9 3.9 4.2  CL 105 104 104  CO2 26  --  25  GLUCOSE 108* 106* 99  BUN 13 13 11    CREATININE 0.99 0.90 1.04  CALCIUM 8.5*  --  8.5*  MG  --   --  1.7    GFR: Estimated Creatinine Clearance: 57.9 mL/min (by C-G formula based on SCr of 1.04 mg/dL).  Liver Function Tests: Recent Labs  Lab 05/04/21 1821 05/05/21 0357  AST 21 19  ALT 13 13  ALKPHOS 40 34*  BILITOT 0.8 0.5  PROT 5.8* 5.6*  ALBUMIN 3.2* 3.0*    CBG: Recent Labs  Lab 05/04/21 1821  GLUCAP 109*     Recent Results (from the past 240 hour(s))  Resp Panel by  RT-PCR (Flu Jaynee Winters&B, Covid) Nasopharyngeal Swab     Status: Abnormal   Collection Time: 05/04/21  7:34 PM   Specimen: Nasopharyngeal Swab; Nasopharyngeal(NP) swabs in vial transport medium  Result Value Ref Range Status   SARS Coronavirus 2 by RT PCR POSITIVE (Hannia Matchett) NEGATIVE Final    Comment: RESULT CALLED TO, READ BACK BY AND VERIFIED WITH: C NUCKLES RN 2112 05/04/21 Enzo Treu BROWNING (NOTE) SARS-CoV-2 target nucleic acids are DETECTED.  The SARS-CoV-2 RNA is generally detectable in upper respiratory specimens during the acute phase of infection. Positive results are indicative of the presence of the identified virus, but do not rule out bacterial infection or co-infection with other pathogens not detected by the test. Clinical correlation with patient history and other diagnostic information is necessary to determine patient infection status. The expected result is Negative.  Fact Sheet for Patients: EntrepreneurPulse.com.au  Fact Sheet for Healthcare Providers: IncredibleEmployment.be  This test is not yet approved or cleared by the [place] FDA and  has been authorized for detection and/or diagnosis of SARS-CoV-2 by FDA under an Emergency Use Authorization (EUA).  This EUA will remain in effect (meaning this test can  be used) for the duration of  the COVID-19 declaration under Section 564(b)(1) of the Act, 21 U.S.C. section 360bbb-3(b)(1), unless the authorization is terminated or revoked  sooner.     Influenza Chavie Kolinski by PCR NEGATIVE NEGATIVE Final   Influenza B by PCR NEGATIVE NEGATIVE Final    Comment: (NOTE) The Xpert Xpress SARS-CoV-2/FLU/RSV plus assay is intended as an aid in the diagnosis of influenza from Nasopharyngeal swab specimens and should not be used as Jahnyla Parrillo sole basis for treatment. Nasal washings and aspirates are unacceptable for Xpert Xpress SARS-CoV-2/FLU/RSV testing.  Fact Sheet for Patients: EntrepreneurPulse.com.au  Fact Sheet for Healthcare Providers: IncredibleEmployment.be  This test is not yet approved or cleared by the [place] FDA and has been authorized for detection and/or diagnosis of SARS-CoV-2 by FDA under an Emergency Use Authorization (EUA). This EUA will remain in effect (meaning this test can be used) for the duration of the COVID-19 declaration under Section 564(b)(1) of the Act, 21 U.S.C. section 360bbb-3(b)(1), unless the authorization is terminated or revoked.  Performed at [facility], [place] [address] [place], [place] [address]          Radiology Studies: MR BRAIN WO CONTRAST  Result Date: 05/04/2021 CLINICAL DATA:  Initial evaluation for acute TIA. EXAM: MRI HEAD WITHOUT CONTRAST TECHNIQUE: Multiplanar, multiecho pulse sequences of the brain and surrounding structures were obtained without intravenous contrast. COMPARISON:  CTs from earlier the same day. Comparison also made with prior MRI from 05/13/2020. FINDINGS: Brain: Cerebral volume within normal limits for age. Scattered patchy T2/FLAIR hyperintensity involving the periventricular and deep white matter both cerebral hemispheres most consistent with chronic small vessel ischemic disease, mild for age. Few small superimposed remote lacunar infarcts noted about the right caudate. Two tiny remote cortical infarcts at the high right frontal lobe noted as well, unchanged. No abnormal foci of restricted diffusion to suggest acute or  subacute ischemia. Gray-white matter differentiation maintained. No encephalomalacia to suggest chronic cortical infarction. No acute intracranial hemorrhage. Chronic hemosiderin staining/siderosis seen along the cortical sulcus at the right frontal lobe, suggesting prior subarachnoid hemorrhage at this location, stable (series 13, image 39). No acute hemorrhage seen within this region on prior CT. No other foci of susceptibility artifact to suggest acute or chronic intracranial hemorrhage. No mass lesion, midline shift or mass effect. No  hydrocephalus or extra-axial fluid collection. Pituitary gland suprasellar region normal. Midline structures intact. Vascular: Irregular flow void within the hypoplastic left V4 segment, consistent with previously identified occlusion. Major intracranial vascular flow voids are otherwise maintained. Skull and upper cervical spine: Craniocervical junction within normal limits. Bone marrow signal intensity normal. No scalp soft tissue abnormality. Sinuses/Orbits: Globes and orbital soft tissues demonstrate no acute finding. Scattered mucosal thickening noted within the ethmoidal air cells and maxillary sinuses. Paranasal sinuses otherwise clear. Trace left mastoid effusion noted, of doubtful significance. Inner ear structures grossly normal. Other: None. IMPRESSION: 1. No acute intracranial abnormality. 2. Chronic hemosiderin staining along Matin Mattioli cortical sulcus at the right frontal lobe, suggesting prior subarachnoid hemorrhage at this location. No acute hemorrhage seen within this region on prior CT. 3. Underlying mild chronic microvascular ischemic disease for age. Electronically Signed   By: Jeannine Boga M.D.   On: 05/04/2021 23:16   DG CHEST PORT 1 VIEW  Result Date: 05/05/2021 CLINICAL DATA:  COVID-19 positivity EXAM: PORTABLE CHEST 1 VIEW COMPARISON:  03/18/2021 FINDINGS: Cardiac shadow is prominent but accentuated by the portable technique. Aortic calcifications are  noted. Lungs are well aerated bilaterally and again demonstrate chronic scarring and pleural thickening in the right lung base stable from the prior exam. No new focal abnormality is seen. IMPRESSION: Chronic changes in the right base. No acute abnormality noted. Electronically Signed   By: Inez Catalina M.D.   On: 05/05/2021 01:27   CT HEAD CODE STROKE WO CONTRAST  Result Date: 05/04/2021 CLINICAL DATA:  Code stroke.  Neuro deficit, acute, stroke suspected EXAM: CT HEAD WITHOUT CONTRAST TECHNIQUE: Contiguous axial images were obtained from the base of the skull through the vertex without intravenous contrast. COMPARISON:  03/26/2021. FINDINGS: Brain: No evidence of acute large vascular territory infarction, hemorrhage, hydrocephalus, extra-axial collection or mass lesion/mass effect. Similar nonspecific mineralization in the right cerebellum. Similar generalized cerebral atrophy with ex vacuo ventricular dilation. Similar mild patchy white matter hypoattenuation, nonspecific but most likely related to chronic microvascular ischemic disease. Partially empty sella. Vascular: No hyperdense vessel identified. Skull: No acute fracture. Sinuses/Orbits: Mild paranasal sinus mucosal thickening. Small/atretic left maxillary sinus Other: No mastoid effusions. ASPECTS [facility] Stroke Program Early CT Score) Total score (0-10 with 10 being normal): 10. IMPRESSION: No evidence of acute large vascular territory infarct or acute hemorrhage. ASPECTS is 10. Code stroke imaging results were communicated on 05/04/2021 at 6:44 pm to provider Dr. Rory Percy via secure text paging. Electronically Signed   By: Margaretha Sheffield MD   On: 05/04/2021 18:44   CT ANGIO HEAD NECK W WO CM (CODE STROKE)  Result Date: 05/04/2021 CLINICAL DATA:  Stroke follow-up. EXAM: CT ANGIOGRAPHY HEAD AND NECK TECHNIQUE: Multidetector CT imaging of the head and neck was performed using the standard protocol during bolus administration of intravenous contrast.  Multiplanar CT image reconstructions and MIPs were obtained to evaluate the vascular anatomy. Carotid stenosis measurements (when applicable) are obtained utilizing NASCET criteria, using the distal internal carotid diameter as the denominator. CONTRAST:  27mL OMNIPAQUE IOHEXOL 350 MG/ML SOLN COMPARISON:  None. FINDINGS: CTA NECK FINDINGS Aortic arch: Great vessel origins are patent. 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 Right carotid system: Calcific and noncalcific tortuous atherosclerosis at the carotid bifurcation without greater than 50% stenosis. Tortuous ICA at the skull base. Left carotid system: Mild narrowing of the common carotid artery due to calcific atherosclerosis. Ulcerated calcific and noncalcific atherosclerosis at the carotid bifurcation without greater than 50% stenosis. Tortuous  ICA at the skull base. Vertebral arteries: Mild stenosis of the right vertebral artery origin due to calcific atherosclerosis. Tortuous right vertebral artery without greater than 50% stenosis. Non dominant left vertebral artery is occluded at its origin with non opacification in neck. Faint reconstitution of the V3 and V4 vertebral artery, likely from collaterals. Skeleton: Moderate to severe multilevel degenerative disc disease, greatest at C3-C4, C4-C5 and C5-C6. Other neck: No acute findings. Upper chest: Visualized lung apices are clear. Review of the MIP images confirms the above findings CTA HEAD FINDINGS Anterior circulation: Bilateral intracranial ICAs, MCAs, and ACAs are patent without proximal flow limiting stenosis. No aneurysm identified. Posterior circulation: Small left intradural vertebral artery. Right intradural vertebral artery is patent without significant stenosis. The basilar artery and bilateral posterior cerebral arteries are patent without hemodynamically significant proximal stenosis. Fetal type left PCA. Prominent right posterior communicating artery with small right P1 PCA.  Small basilar artery. Venous sinuses: As permitted by contrast timing, patent. Anatomic variants: Detailed above. Review of the MIP images confirms the above findings IMPRESSION: CTA Head: No large vessel occlusion or proximal hemodynamically significant stenosis. CTA neck: 1. 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 2. Bilateral carotid bifurcation atherosclerosis without greater than 50% stenosis. 3. 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 Electronically Signed   By: Margaretha Sheffield MD   On: 05/04/2021 19:13        Scheduled Meds:  furosemide  40 mg Oral Daily   nirmatrelvir/ritonavir EUA  3 tablet Oral BID   polyvinyl alcohol  1 drop Both Eyes Daily   Continuous Infusions:  sodium chloride 100 mL/hr (05/05/21 1250)   heparin 1,100 Units/hr (05/05/21 1326)     LOS: 0 days    Time spent: over 30 min    Louis Helper, MD [facility]   To contact the attending provider between 7A-7P or the covering provider during after hours 7P-7A, please log into the web site [email or web address] and access using universal [place] password for that web site. If you do not have the password, please call the hospital operator.  05/05/2021, 4:57 PM

## 2021-05-05 NOTE — Progress Notes (Signed)
[place] for heparin Indication:  Aflutter  No Known Allergies  Patient Measurements: Height: 5\' 8"  (172.7 cm) Weight: 84.4 kg (186 lb) IBW/kg (Calculated) : 68.4  Vital Signs: Temp: 97.7 F (36.5 C) (08/10 2000) Temp Source: Oral (08/10 2000) BP: 124/85 (08/10 2000) Pulse Rate: 49 (08/10 2000)  Labs: Recent Labs    05/04/21 1821 05/04/21 1828 05/04/21 2047 05/05/21 0357 05/05/21 1142 05/05/21 2036  HGB 14.7 15.3  --  14.3  --   --   HCT 44.5 45.0  --  43.1  --   --   PLT 122*  --   --  107*  --   --   APTT 28  --   --   --  137* 136*  LABPROT 15.4*  --   --   --   --   --   INR 1.2  --   --   --   --   --   HEPARINUNFRC  --   --   --   --  >1.10* >1.10*  CREATININE 0.99 0.90  --  1.04  --   --   TROPONINIHS 12  --  15  --   --   --      Estimated Creatinine Clearance: 57.9 mL/min (by C-G formula based on SCr of 1.04 mg/dL).   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 signficant DDD present with vacuum effect L4-5 and L5-S1 flat discs and anterior spurring noted   Dizziness  05/06/2020   Dyslipidemia 03/18/2015   Dyspnea on exertion 04/02/2020   Ear itch 05/14/2018   Edema 11/16/2015   Edema of both lower extremities due to peripheral venous insufficiency 02/03/2020   First degree AV block 10/20/2020   History of hiatal hernia    seen on last scan- as slight    History of thrombocytopenia    History of TIAs    Hyperlipidemia    Hypertension    Irritable bowel syndrome 11/17/2016   rec trial of citrucel/ diet 11/16/2016 >>>      Local edema 02/07/2018   Malaise and fatigue 12/08/2020   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 [place]. And Dr. 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 These slowly enlarging 9 mm right lower lobe pulmonary nodule along the right hemidiaphragm does not appear hypermetabolic today. Location adjacent to the hemidiaphragm can cause false negatives due to motion artifact related to the   Stage 3a chronic kidney disease ([place]) 06/17/2020   Stroke [facility])    Swelling of limb-Left foot 01/01/2015   Vitamin B12 deficiency 06/21/2017    Assessment: 80yo male presents to ED w/ AMS, found to be in first-degree heart  block by EMS w/ rate in the 30s, improved only to 40s after atropine, found to be Covid positive with high risk of progressing to severe dz >> to be treated with Paxlovid; Paxlovid cannot be administered w/ Eliquis, which pt takes for Aflutter (last dose taken 8/9 8a), so admitting DO requests change to UFH during five-day course of Paxlovid without options for other Covid therapy (remdesivir carries the risk of worsening bradycardia).  Of note pt was being evaluated for CVA/TIA but MRI is negative.  Repeat aPTT remains elevated.  Goal of Therapy:  Heparin level 0.3-0.7 units/ml aPTT 66-102 seconds Monitor platelets by anticoagulation protocol: Yes   Plan:  Decrease heparin to 900 units/h Recheck aPTT and heparin level in 8h  Arrie Senate, PharmD, [place], [place] Pharmacist [phone] Please check AMION for all [place] numbers 05/05/2021

## 2021-05-05 NOTE — ED Notes (Signed)
Given lunch meal. Pt speaking on phone.

## 2021-05-05 NOTE — Consult Note (Addendum)
Cardiology Consultation:   Patient ID: Louis Barton MRN: [id number]; DOB: [birth year]  Admit date: 05/04/2021 Date of Consult: 05/05/2021  PCP:  Raina Mina., MD   [facility] Providers Cardiologist:  Jenne Campus, MD   {   Patient Profile:   Louis Barton is a 80 y.o. 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  History of Present Illness:   Louis Barton states he has been feeling generalized weakness and fatigue for 2 weeks that has progressively worsening for a week to the point that he barely gets out of his bed. Patient complains of having some mild pain in the right lower abdomen. He says his BP has been high in the past week  (~170/37mmHg). When asked about bradycardia, he says he has a history of slow heart rate. He was seen at an urgent care today for evaluation of sore throat, headache, cough and a fever.  He was found to be COVID-positive.  Patient reports he received 2 COVID vaccinations and had a history of COVID in 2020. In ED, patient was evaluated by our neurology for stroke evaluation.   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 signficant DDD present with vacuum effect L4-5 and L5-S1 flat discs and anterior spurring noted   Dizziness 05/06/2020   Dyslipidemia 03/18/2015   Dyspnea on exertion 04/02/2020   Ear itch 05/14/2018   Edema 11/16/2015   Edema of both lower extremities due to peripheral venous insufficiency 02/03/2020   First degree AV block 10/20/2020   History of hiatal hernia    seen on last scan- as slight    History of thrombocytopenia    History of TIAs    Hyperlipidemia    Hypertension    Irritable bowel syndrome 11/17/2016   rec trial of citrucel/ diet 11/16/2016 >>>      Local edema 02/07/2018   Malaise and fatigue 12/08/2020   Malignant neoplasm of left lung ([place]) 12/08/2017   Stage 1A. [place]. And Dr. 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 Renal parapelvic cysts 10/06/16 on CT , right kidney midpole 1.2 cm, right kidney anterior pole 1.1 cm, and to the left kidney lower pole anterior position 3.5cm all felt to be benign   Right bundle branch block 10/20/2020   Right lower lobe pulmonary nodule 11/09/2017   Shingles    internal- obstruction of bowels & gallbladder   Solitary pulmonary nodule 11/16/2016   CT ABd 03/24/09 linerar densities in RLL and RML c/w scarring o/w clear CT Abd 10/20/16 c/w 7 mm nodule  - CT chest 02/13/2017 :  slt  enlarged to 9 mm   - PET 04/04/17 :1. These slowly enlarging 9 mm right lower lobe pulmonary nodule along the right hemidiaphragm does not appear hypermetabolic today. Location adjacent to the hemidiaphragm can cause false negatives due to motion artifact related to the   Stage 3a chronic kidney disease ([place]) 06/17/2020   Stroke ([place])    Swelling of limb-Left foot 01/01/2015   Vitamin B12 deficiency 06/21/2017    Past Surgical History:  Procedure Laterality Date   adenocarcinoma  2019   Removed   CARDIOVERSION N/A 03/22/2021   Procedure: CARDIOVERSION;  Surgeon: Lelon Perla, MD;  Location: [place];  Service: Cardiovascular;  Laterality: N/A;   CAROTID ENDARTERECTOMY  2005   Left carotid by Dr. Oneida Alar   CAROTID ENDARTERECTOMY  04/25/11   Right Carotid by Dr. Oneida Alar   COLONOSCOPY  02/23/2017   Colonic polyp status post polypectomy. Pancolonic diverticulosis predominatly in the sigmoid colon. Tubular adenoma   LEFT HEART CATH AND CORONARY ANGIOGRAPHY N/A 02/08/2021   Procedure: LEFT HEART CATH AND CORONARY ANGIOGRAPHY;  Surgeon: Burnell Blanks, MD;  Location: [place] [facility];  Service: Cardiovascular;  Laterality: N/A;   Septoplasty, nasal w/ submucosal resection  1960   SQUAMOUS CELL CARCINOMA EXCISION     pt said numerous times   TEE WITHOUT CARDIOVERSION N/A 03/22/2021   Procedure: TRANSESOPHAGEAL ECHOCARDIOGRAM (TEE);  Surgeon: Lelon Perla, MD;  Location[facility];  Service: Cardiovascular;  Laterality: N/A;   TONSILLECTOMY     VIDEO ASSISTED THORACOSCOPY (VATS)/WEDGE RESECTION Right 11/09/2017   Procedure: RIGHT VIDEO ASSISTED THORACOSCOPY WITH Chales Salmon RESECTION;  Surgeon: [name], MD;  Location: [facility] OR;  Service: Thoracic;  Laterality: Right;     Inpatient Medications: Scheduled Meds:  Continuous Infusions:  sodium chloride 100 mL/hr (05/04/21 2352)   PRN Meds:   Allergies:   No Known Allergies  Social History:   Social History    Socioeconomic History   Marital status: Married    Spouse name: Not on file   Number of children: 2   Years of education: Not on file   Highest education level: Not on file  Occupational History   Not on file  Tobacco Use   Smoking status: Former    Packs/day: 1.00    Years: 16.00    Pack years: 16.00    Types: Cigarettes    Quit date: 09/26/1972    Years since quitting: 48.6   Smokeless tobacco: Never  Vaping Use   Vaping Use: Never used  Substance and Sexual Activity   Alcohol use: Not Currently    Alcohol/week: 2.0 standard drinks    Types: 2 Glasses of wine per week   Drug use: No   Sexual activity: Not on file  Other Topics Concern   Not on file  Social History Narrative  Not on file   Social Determinants of Health   Financial Resource Strain: Not on file  Food Insecurity: Not on file  Transportation Needs: Not on file  Physical Activity: Not on file  Stress: Not on file  Social Connections: Not on file  Intimate Partner Violence: Not on file    Family History:    Family History  Problem Relation Age of Onset   Cancer Mother 31       Breast   Stroke Mother    Diabetes Mother    Hyperlipidemia Mother    Other Mother        varicose veins   Heart attack Mother    Heart disease Mother        Left ankle swelling   Hypertension Mother    Deep vein thrombosis Mother    Varicose Veins Mother    Diabetes Father    Heart attack Father    Other Sister        Tumor in Lung and Brain   Cancer Sister        Tumor   Lung  and  Brain   Cancer Brother        BCC-SCC-Merkle Cell   Colon cancer Neg Hx    Esophageal cancer Neg Hx      ROS:  Please see the history of present illness.   All other ROS reviewed and negative.     Physical Exam/Data:   Vitals:   05/04/21 2145 05/05/21 0030 05/05/21 0035 05/05/21 0114  BP: (!) 156/54 (!) 128/54    Pulse: (!) 40 67 (!) 36   Resp: (!) 24 (!) 25 (!) 24   Temp:      TempSrc:      SpO2: 94% 91% 93% 94%     Intake/Output Summary (Last 24 hours) at 05/05/2021 0127 Last data filed at 05/04/2021 2118 Gross per 24 hour  Intake 616.67 ml  Output --  Net 616.67 ml   Last 3 Weights 03/26/2021 03/22/2021 03/22/2021  Weight (lbs) 186 lb 181 lb 181 lb 3.2 oz  Weight (kg) 84.369 kg 82.101 kg 82.192 kg     There is no height or weight on file to calculate BMI.  General:  well developed, in no acute distress HEENT: normal Lymph: no adenopathy Neck: no JVD Endocrine:  No thryomegaly Vascular: No carotid bruits; FA pulses 2+ bilaterally without bruits  Cardiac:  normal S1, S2; RRR; no murmur  Lungs:  clear to auscultation bilaterally, no wheezing, rhonchi or rales  Abd: soft, nontender, no hepatomegaly  Ext: 3+ pitting edema bilaterally  Musculoskeletal:  No deformities, BUE and BLE strength 4/5 and equal Skin: warm and dry  Neuro:  CNs 2-12 intact, no focal abnormalities noted Psych:  Normal affect   EKG:  The EKG was personally reviewed and demonstrates:  sinus rhythm with 1st degree AVB Telemetry:  Telemetry was personally reviewed and demonstrates:  sinus rhythm,   Relevant CV Studies: TTE 03/19/21  1. Left ventricular ejection fraction, by estimation, is 60 to 65%. The  left ventricle has normal function. The left ventricle has no regional  wall motion abnormalities. There is mild left ventricular hypertrophy.  Left ventricular diastolic parameters  are indeterminate.   2. Right ventricular systolic function is normal. The right ventricular  size is normal.   3. The mitral valve is normal in structure. Mild mitral valve  regurgitation. No evidence of mitral stenosis.   4. The aortic valve is calcified. Aortic valve regurgitation  is trivial.  Mild to moderate aortic valve sclerosis/calcification is present, without  any evidence of aortic stenosis.   5. The inferior vena cava is normal in size with greater than 50%  respiratory variability, suggesting right atrial pressure of 3 mmHg.    Laboratory Data:  High Sensitivity Troponin:   Recent Labs  Lab 05/04/21 1821 05/04/21 2047  TROPONINIHS 12 15     Chemistry Recent Labs  Lab 05/04/21 1821 05/04/21 1828  NA 137 139  K 3.9 3.9  CL 105 104  CO2 26  --   GLUCOSE 108* 106*  BUN 13 13  CREATININE 0.99 0.90  CALCIUM 8.5*  --   GFRNONAA >60  --   ANIONGAP 6  --     Recent Labs  Lab 05/04/21 1821  PROT 5.8*  ALBUMIN 3.2*  AST 21  ALT 13  ALKPHOS 40  BILITOT 0.8   Hematology Recent Labs  Lab 05/04/21 1821 05/04/21 1828  WBC 8.8  --   RBC 4.79  --   HGB 14.7 15.3  HCT 44.5 45.0  MCV 92.9  --   MCH 30.7  --   MCHC 33.0  --   RDW 13.8  --   PLT 122*  --    BNPNo results for input(s): BNP, PROBNP in the last 168 hours.  DDimer No results for input(s): DDIMER in the last 168 hours.   Radiology/Studies:  MR BRAIN WO CONTRAST  Result Date: 05/04/2021 CLINICAL DATA:  Initial evaluation for acute TIA. EXAM: MRI HEAD WITHOUT CONTRAST TECHNIQUE: Multiplanar, multiecho pulse sequences of the brain and surrounding structures were obtained without intravenous contrast. COMPARISON:  CTs from earlier the same day. Comparison also made with prior MRI from 05/13/2020. FINDINGS: Brain: Cerebral volume within normal limits for age. Scattered patchy T2/FLAIR hyperintensity involving the periventricular and deep white matter both cerebral hemispheres most consistent with chronic small vessel ischemic disease, mild for age. Few small superimposed remote lacunar infarcts noted about the right caudate. Two tiny remote cortical infarcts at the high right frontal lobe noted as well, unchanged. No abnormal foci of restricted diffusion to suggest acute or subacute ischemia. Gray-white matter differentiation maintained. No encephalomalacia to suggest chronic cortical infarction. No acute intracranial hemorrhage. Chronic hemosiderin staining/siderosis seen along the cortical sulcus at the right frontal lobe, suggesting prior  subarachnoid hemorrhage at this location, stable (series 13, image 39). No acute hemorrhage seen within this region on prior CT. No other foci of susceptibility artifact to suggest acute or chronic intracranial hemorrhage. No mass lesion, midline shift or mass effect. No hydrocephalus or extra-axial fluid collection. Pituitary gland suprasellar region normal. Midline structures intact. Vascular: Irregular flow void within the hypoplastic left V4 segment, consistent with previously identified occlusion. Major intracranial vascular flow voids are otherwise maintained. Skull and upper cervical spine: Craniocervical junction within normal limits. Bone marrow signal intensity normal. No scalp soft tissue abnormality. Sinuses/Orbits: Globes and orbital soft tissues demonstrate no acute finding. Scattered mucosal thickening noted within the ethmoidal air cells and maxillary sinuses. Paranasal sinuses otherwise clear. Trace left mastoid effusion noted, of doubtful significance. Inner ear structures grossly normal. Other: None. IMPRESSION: 1. No acute intracranial abnormality. 2. Chronic hemosiderin staining along a cortical sulcus at the right frontal lobe, suggesting prior subarachnoid hemorrhage at this location. No acute hemorrhage seen within this region on prior CT. 3. Underlying mild chronic microvascular ischemic disease for age. Electronically Signed   By: Jeannine Boga M.D.   On: 05/04/2021 23:16  CT HEAD CODE STROKE WO CONTRAST  Result Date: 05/04/2021 CLINICAL DATA:  Code stroke.  Neuro deficit, acute, stroke suspected EXAM: CT HEAD WITHOUT CONTRAST TECHNIQUE: Contiguous axial images were obtained from the base of the skull through the vertex without intravenous contrast. COMPARISON:  03/26/2021. FINDINGS: Brain: No evidence of acute large vascular territory infarction, hemorrhage, hydrocephalus, extra-axial collection or mass lesion/mass effect. Similar nonspecific mineralization in the right  cerebellum. Similar generalized cerebral atrophy with ex vacuo ventricular dilation. Similar mild patchy white matter hypoattenuation, nonspecific but most likely related to chronic microvascular ischemic disease. Partially empty sella. Vascular: No hyperdense vessel identified. Skull: No acute fracture. Sinuses/Orbits: Mild paranasal sinus mucosal thickening. Small/atretic left maxillary sinus Other: No mastoid effusions. ASPECTS [facility] Stroke Program Early CT Score) Total score (0-10 with 10 being normal): 10. IMPRESSION: No evidence of acute large vascular territory infarct or acute hemorrhage. ASPECTS is 10. Code stroke imaging results were communicated on 05/04/2021 at 6:44 pm to provider Dr. Rory Percy via secure text paging. Electronically Signed   By: Margaretha Sheffield MD   On: 05/04/2021 18:44   CT ANGIO HEAD NECK W WO CM (CODE STROKE)  Result Date: 05/04/2021 CLINICAL DATA:  Stroke follow-up. EXAM: CT ANGIOGRAPHY HEAD AND NECK TECHNIQUE: Multidetector CT imaging of the head and neck was performed using the standard protocol during bolus administration of intravenous contrast. Multiplanar CT image reconstructions and MIPs were obtained to evaluate the vascular anatomy. Carotid stenosis measurements (when applicable) are obtained utilizing NASCET criteria, using the distal internal carotid diameter as the denominator. CONTRAST:  7mL OMNIPAQUE IOHEXOL 350 MG/ML SOLN COMPARISON:  None. FINDINGS: CTA NECK FINDINGS Aortic arch: Great vessel origins are patent. 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 Right carotid system: Calcific and noncalcific tortuous atherosclerosis at the carotid bifurcation without greater than 50% stenosis. Tortuous ICA at the skull base. Left carotid system: Mild narrowing of the common carotid artery due to calcific atherosclerosis. Ulcerated calcific and noncalcific atherosclerosis at the carotid bifurcation without greater than 50% stenosis. Tortuous ICA at the skull  base. Vertebral arteries: Mild stenosis of the right vertebral artery origin due to calcific atherosclerosis. Tortuous right vertebral artery without greater than 50% stenosis. Non dominant left vertebral artery is occluded at its origin with non opacification in neck. Faint reconstitution of the V3 and V4 vertebral artery, likely from collaterals. Skeleton: Moderate to severe multilevel degenerative disc disease, greatest at C3-C4, C4-C5 and C5-C6. Other neck: No acute findings. Upper chest: Visualized lung apices are clear. Review of the MIP images confirms the above findings CTA HEAD FINDINGS Anterior circulation: Bilateral intracranial ICAs, MCAs, and ACAs are patent without proximal flow limiting stenosis. No aneurysm identified. Posterior circulation: Small left intradural vertebral artery. Right intradural vertebral artery is patent without significant stenosis. The basilar artery and bilateral posterior cerebral arteries are patent without hemodynamically significant proximal stenosis. Fetal type left PCA. Prominent right posterior communicating artery with small right P1 PCA. Small basilar artery. Venous sinuses: As permitted by contrast timing, patent. Anatomic variants: Detailed above. Review of the MIP images confirms the above findings IMPRESSION: CTA Head: No large vessel occlusion or proximal hemodynamically significant stenosis. CTA neck: 1. 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 2. Bilateral carotid bifurcation atherosclerosis without greater than 50% stenosis. 3. 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 Electronically Signed   By: Margaretha Sheffield MD   On: 05/04/2021 19:13     Assessment and Plan:  2:1 AV block/ first degree AVB -patient is admitted for acute on chronic fatigue and generalized weakness in the setting of symptomatic COVID infection (cough, sore throat and reported fever last night) -his telemetry  demonstrated intermittent 2:1 AV block with narrow QRS, baseline LONG PR 1st AV block with normal even high BP, patient's mental status stable, and not on home AVN block agent -TTE in June showed normal LVEF -continue Telemetry -continue to correct electrolytes and COVID management per primary team -Hold any AVN blocking agents -No need for temp pacing or permanent pacing at this time -keep atropine at bedside for unstable bradycardia.  Avoid other medications could potentially cause bradycardia.  -Limit any possible vagal triggers -We will follow along and reassess if pacing is going to be needed  Risk Assessment/Risk Scores:     For questions or updates, please contact [place] Please consult [email or web address] for contact info under    Signed, Laurice Record, MD  05/05/2021 1:28 AM   Personally seen and examined. Agree with Fellow above with the following comments: Briefly 80 yo M with a history of mild non-obstructive CAD, AFL s/p recent DCCV 6/22; 1st HB and intermittent RBBB (BIFB) HTN, CAS, Hx of prior TID, and history of [place] s/p resection. Patient notes his chief complaint was sore throat, cough, fevere and found to have COVID-19.  Seen urgently in the ED with AMS; at this time, patient has in 2:1 AV block (morphologically 1st HB).  Both when assessed overnight and today mentating well Exam notable for bilateral pitting edema; no crackles but decrease respiratory effort. Personally reviewed relevant tests; ECG and telemetry consider with BIFB with prolong PR Would recommend  - no present indications for pacing; it would be reasonable for outpatient EP eval; patient has been told for two years that he may eventually need PPM - AC for his AFL; can be changed from [place] as needed for COVID-19 therapy - continue home statin - would return home lasix (ordered)  Rudean Haskell, MD [place]  [address] [place], [place] [address] [phone]  1:05 PM

## 2021-05-05 NOTE — Telephone Encounter (Signed)
Wrong encounter. Disregard.

## 2021-05-05 NOTE — ED Notes (Signed)
Pt alert, NAD, calm, interactive, denies pain, sob, nausea.

## 2021-05-05 NOTE — H&P (Signed)
History and Physical    Louis Barton UUV:[id number] DOB: 1940/12/30 DOA: 05/04/2021  PCP: Raina Mina., MD  Patient coming from: Home  I have personally briefly reviewed patient's old medical records in [place]  Chief Complaint: Bradycardia, abd pain  HPI: Louis Barton is a 79 y.o. male with medical history significant of A.Flutter with SVR in June s/p DCCV on eliquis, not on any nodal blocking agents due to h/o SVR and intermittent bradycardia; HTN, B CEAs.  Pt presents to ED for evaluation of generalized weakness, abd pain, bradycardia.  Symptoms onset earlier today, weakness to point of being unable to sit up.  Some mild pain in R lower abdomen.  Associated sore throat, headache, cough, congestion.  No vomiting no diarrhea no fevers.   ED Course: In the ED has now started to have some confusion and difficulty with word finding.  Ultimately code stroke called and seen by neurology.  No LVO on CTA.  MRI brain - no acute findings (may have had prior SAH at some point in past).  COVID-19 is positive.  Satting mid 90s on RA.  HR is going in and out of a 2nd degree AV block with 2:1 conduction and rate in the 30s when it is occurring.   Review of Systems: As per HPI, otherwise all review of systems negative.  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 signficant DDD present with vacuum effect L4-5 and L5-S1 flat discs and anterior spurring noted   Dizziness 05/06/2020   Dyslipidemia 03/18/2015   Dyspnea on exertion 04/02/2020   Ear itch 05/14/2018   Edema 11/16/2015   Edema of both lower extremities due to peripheral venous insufficiency 02/03/2020   First degree AV block 10/20/2020   History of hiatal hernia    seen on last scan- as slight    History of thrombocytopenia    History of TIAs    Hyperlipidemia    Hypertension    Irritable bowel syndrome 11/17/2016   rec trial of citrucel/ diet 11/16/2016 >>>      Local edema 02/07/2018   Malaise and fatigue 12/08/2020   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 [place]. And Dr. 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 Renal  parapelvic cysts 10/06/16 on CT , right kidney midpole 1.2 cm, right kidney anterior pole 1.1 cm, and to the left kidney lower pole anterior position 3.5cm all felt to be benign   Right bundle branch block 10/20/2020   Right lower lobe pulmonary nodule 11/09/2017   Shingles    internal- obstruction of bowels & gallbladder   Solitary pulmonary nodule 11/16/2016   CT ABd 03/24/09 linerar densities in RLL and RML c/w scarring o/w clear CT Abd 10/20/16 c/w 7 mm nodule  - CT chest 02/13/2017 :  slt enlarged to 9 mm   - PET 04/04/17 :1. These slowly enlarging 9 mm right lower lobe pulmonary nodule along the right hemidiaphragm does not appear hypermetabolic today.  Location adjacent to the hemidiaphragm can cause false negatives due to motion artifact related to the   Stage 3a chronic kidney disease ([place]) 06/17/2020   Stroke ([place])    Swelling of limb-Left foot 01/01/2015   Vitamin B12 deficiency 06/21/2017    Past Surgical History:  Procedure Laterality Date   adenocarcinoma  2019   Removed   CARDIOVERSION N/A 03/22/2021   Procedure: CARDIOVERSION;  Surgeon: Lelon Perla, MD;  Location: [facility];  Service: Cardiovascular;  Laterality: N/A;   CAROTID ENDARTERECTOMY  2005   Left carotid by Dr. Oneida Alar   CAROTID ENDARTERECTOMY  04/25/11   Right Carotid by Dr. Oneida Alar   COLONOSCOPY  02/23/2017   Colonic polyp status post polypectomy. Pancolonic diverticulosis predominatly in the sigmoid colon. Tubular adenoma   LEFT HEART CATH AND CORONARY ANGIOGRAPHY N/A 02/08/2021   Procedure: LEFT HEART CATH AND CORONARY ANGIOGRAPHY;  Surgeon: Burnell Blanks, MD;  Location: [facility];  Service: Cardiovascular;  Laterality: N/A;   Septoplasty, nasal w/ submucosal resection  1960   SQUAMOUS CELL CARCINOMA EXCISION     pt said numerous times   TEE WITHOUT CARDIOVERSION N/A 03/22/2021   Procedure: TRANSESOPHAGEAL ECHOCARDIOGRAM (TEE);  Surgeon: Lelon Perla, MD;  Location: [facility];  Service: Cardiovascular;  Laterality: N/A;   TONSILLECTOMY     VIDEO ASSISTED THORACOSCOPY (VATS)/WEDGE RESECTION Right 11/09/2017   Procedure: RIGHT VIDEO ASSISTED THORACOSCOPY WITH [place];  Surgeon: [name], MD;  Location: [facility];  Service: Thoracic;  Laterality: Right;     reports that he quit smoking about 48 years ago. His smoking use included cigarettes. He has a 16.00 pack-year smoking history. He has never used smokeless tobacco. He reports previous alcohol use of about 2.0 standard drinks of alcohol per week. He reports that he does not use drugs.  No Known Allergies  Family History  Problem Relation Age of Onset   Cancer  Mother 50       Breast   Stroke Mother    Diabetes Mother    Hyperlipidemia Mother    Other Mother        varicose veins   Heart attack Mother    Heart disease Mother        Left ankle swelling   Hypertension Mother    Deep vein thrombosis Mother    Varicose Veins Mother    Diabetes Father    Heart attack Father    Other Sister        Tumor in Lung and Brain   Cancer Sister        Tumor   Lung  and  Brain   Cancer Brother        BCC-SCC-Merkle Cell   Colon cancer Neg Hx  Esophageal cancer Neg Hx      Prior to Admission medications   Medication Sig Start Date End Date Taking? Authorizing Provider  acetaminophen (TYLENOL) 500 MG tablet Take 1,000 mg by mouth every 6 (six) hours as needed for mild pain or moderate pain (for pain.).   Yes [provider]  apixaban (ELIQUIS) 5 MG TABS tablet Take 1 tablet (5 mg total) by mouth 2 (two) times daily. 03/22/21  Yes Kroeger, Lorelee Cover., PA-C  Cholecalciferol (VITAMIN D3) 50 MCG (2000 UT) TABS Take 2,000 Units by mouth daily.   Yes [provider]  colesevelam (WELCHOL) 625 MG tablet Take 1,250 mg by mouth 2 (two) times daily with a meal.   Yes [provider]  fluticasone (FLONASE) 50 MCG/ACT nasal spray Place 1 spray into both nostrils daily as needed for allergies. 04/07/21  Yes [provider]  Folic Acid (FOLATE PO) Take 1 tablet by mouth daily with lunch. Unknown strength   Yes [provider]  furosemide (LASIX) 40 MG tablet Take 1 tablet (40 mg total) by mouth daily as needed for edema (for weight gain of 3lbs overnight, 5 lbs in 1 week). 03/22/21 06/20/21 Yes Kroeger, Lorelee Cover., PA-C  losartan (COZAAR) 100 MG tablet Take 1 tablet (100 mg total) by mouth daily. 03/26/21  Yes Park Liter, MD  Polyethyl Glycol-Propyl Glycol (SYSTANE ULTRA OP) Place 1 drop into both eyes daily. Unknown strength   Yes [provider]  potassium chloride (KLOR-CON) 10 MEQ tablet Take 2 tablets (20  mEq total) by mouth daily as needed (take on days you take lasix). 03/22/21 06/20/21 Yes Kroeger, Lorelee Cover., PA-C  rosuvastatin (CRESTOR) 40 MG tablet Take 1 tablet (40 mg total) by mouth at bedtime. 03/22/21  Yes Kroeger, Daleen Snook M., PA-C  valACYclovir (VALTREX) 1000 MG tablet Take 1,000 mg by mouth 2 (two) times daily as needed (for fever blisters.).  05/19/17  Yes [provider]    Physical Exam: Vitals:   05/05/21 0030 05/05/21 0035 05/05/21 0114 05/05/21 0115  BP: (!) 128/54     Pulse: 67 (!) 36    Resp: (!) 25 (!) 24    Temp:      TempSrc:      SpO2: 91% 93% 94%   Weight:    84.4 kg  Height:    5\' 8"  (1.727 m)    Constitutional: NAD, mildly confused Eyes: PERRL, lids and conjunctivae normal ENMT: Mucous membranes are moist. Posterior pharynx clear of any exudate or lesions.Normal dentition.  Neck: normal, supple, no masses, no thyromegaly Respiratory: clear to auscultation bilaterally, no wheezing, no crackles. Normal respiratory effort. No accessory muscle use.  Cardiovascular: Bradycardic, 3+ BLE pitting edema Abdomen: no tenderness, no masses palpated. No hepatosplenomegaly. Bowel sounds positive.  Musculoskeletal: no clubbing / cyanosis. No joint deformity upper and lower extremities. Good ROM, no contractures. Normal muscle tone.  Skin: no rashes, lesions, ulcers. No induration Neurologic: CN 2-12 grossly intact. Sensation intact, DTR normal. Strength 5/5 in all 4.  Psychiatric: Confused.   Labs on Admission: I have personally reviewed following labs and imaging studies  CBC: Recent Labs  Lab 05/04/21 1821 05/04/21 1828  WBC 8.8  --   NEUTROABS 7.0  --   HGB 14.7 15.3  HCT 44.5 45.0  MCV 92.9  --   PLT 122*  --    Basic Metabolic Panel: Recent Labs  Lab 05/04/21 1821 05/04/21 1828  NA 137 139  K 3.9 3.9  CL 105  104  CO2 26  --   GLUCOSE 108* 106*  BUN 13 13  CREATININE 0.99 0.90  CALCIUM 8.5*  --    GFR: Estimated Creatinine Clearance: 67  mL/min (by C-G formula based on SCr of 0.9 mg/dL). Liver Function Tests: Recent Labs  Lab 05/04/21 1821  AST 21  ALT 13  ALKPHOS 40  BILITOT 0.8  PROT 5.8*  ALBUMIN 3.2*   Recent Labs  Lab 05/04/21 1856  LIPASE 23   No results for input(s): AMMONIA in the last 168 hours. Coagulation Profile: Recent Labs  Lab 05/04/21 1821  INR 1.2   Cardiac Enzymes: No results for input(s): CKTOTAL, CKMB, CKMBINDEX, TROPONINI in the last 168 hours. BNP (last 3 results) No results for input(s): PROBNP in the last 8760 hours. HbA1C: No results for input(s): HGBA1C in the last 72 hours. CBG: Recent Labs  Lab 05/04/21 1821  GLUCAP 109*   Lipid Profile: No results for input(s): CHOL, HDL, LDLCALC, TRIG, CHOLHDL, LDLDIRECT in the last 72 hours. Thyroid Function Tests: No results for input(s): TSH, T4TOTAL, FREET4, T3FREE, THYROIDAB in the last 72 hours. Anemia Panel: No results for input(s): VITAMINB12, FOLATE, FERRITIN, TIBC, IRON, RETICCTPCT in the last 72 hours. Urine analysis:    Component Value Date/Time   COLORURINE YELLOW 05/04/2021 2028   APPEARANCEUR CLEAR 05/04/2021 2028   LABSPEC >1.046 (H) 05/04/2021 2028   PHURINE 6.0 05/04/2021 2028   GLUCOSEU NEGATIVE 05/04/2021 2028   HGBUR SMALL (A) 05/04/2021 2028   BILIRUBINUR NEGATIVE 05/04/2021 2028   KETONESUR 5 (A) 05/04/2021 2028   PROTEINUR NEGATIVE 05/04/2021 2028   UROBILINOGEN 1.0 04/21/2011 0834   NITRITE NEGATIVE 05/04/2021 2028   LEUKOCYTESUR NEGATIVE 05/04/2021 2028    Radiological Exams on Admission: MR BRAIN WO CONTRAST  Result Date: 05/04/2021 CLINICAL DATA:  Initial evaluation for acute TIA. EXAM: MRI HEAD WITHOUT CONTRAST TECHNIQUE: Multiplanar, multiecho pulse sequences of the brain and surrounding structures were obtained without intravenous contrast. COMPARISON:  CTs from earlier the same day. Comparison also made with prior MRI from 05/13/2020. FINDINGS: Brain: Cerebral volume within normal limits for  age. Scattered patchy T2/FLAIR hyperintensity involving the periventricular and deep white matter both cerebral hemispheres most consistent with chronic small vessel ischemic disease, mild for age. Few small superimposed remote lacunar infarcts noted about the right caudate. Two tiny remote cortical infarcts at the high right frontal lobe noted as well, unchanged. No abnormal foci of restricted diffusion to suggest acute or subacute ischemia. Gray-white matter differentiation maintained. No encephalomalacia to suggest chronic cortical infarction. No acute intracranial hemorrhage. Chronic hemosiderin staining/siderosis seen along the cortical sulcus at the right frontal lobe, suggesting prior subarachnoid hemorrhage at this location, stable (series 13, image 39). No acute hemorrhage seen within this region on prior CT. No other foci of susceptibility artifact to suggest acute or chronic intracranial hemorrhage. No mass lesion, midline shift or mass effect. No hydrocephalus or extra-axial fluid collection. Pituitary gland suprasellar region normal. Midline structures intact. Vascular: Irregular flow void within the hypoplastic left V4 segment, consistent with previously identified occlusion. Major intracranial vascular flow voids are otherwise maintained. Skull and upper cervical spine: Craniocervical junction within normal limits. Bone marrow signal intensity normal. No scalp soft tissue abnormality. Sinuses/Orbits: Globes and orbital soft tissues demonstrate no acute finding. Scattered mucosal thickening noted within the ethmoidal air cells and maxillary sinuses. Paranasal sinuses otherwise clear. Trace left mastoid effusion noted, of doubtful significance. Inner ear structures grossly normal. Other: None. IMPRESSION: 1. No acute intracranial abnormality.  2. Chronic hemosiderin staining along a cortical sulcus at the right frontal lobe, suggesting prior subarachnoid hemorrhage at this location. No acute hemorrhage  seen within this region on prior CT. 3. Underlying mild chronic microvascular ischemic disease for age. Electronically Signed   By: Jeannine Boga M.D.   On: 05/04/2021 23:16   DG CHEST PORT 1 VIEW  Result Date: 05/05/2021 CLINICAL DATA:  COVID-19 positivity EXAM: PORTABLE CHEST 1 VIEW COMPARISON:  03/18/2021 FINDINGS: Cardiac shadow is prominent but accentuated by the portable technique. Aortic calcifications are noted. Lungs are well aerated bilaterally and again demonstrate chronic scarring and pleural thickening in the right lung base stable from the prior exam. No new focal abnormality is seen. IMPRESSION: Chronic changes in the right base. No acute abnormality noted. Electronically Signed   By: Inez Catalina M.D.   On: 05/05/2021 01:27   CT HEAD CODE STROKE WO CONTRAST  Result Date: 05/04/2021 CLINICAL DATA:  Code stroke.  Neuro deficit, acute, stroke suspected EXAM: CT HEAD WITHOUT CONTRAST TECHNIQUE: Contiguous axial images were obtained from the base of the skull through the vertex without intravenous contrast. COMPARISON:  03/26/2021. FINDINGS: Brain: No evidence of acute large vascular territory infarction, hemorrhage, hydrocephalus, extra-axial collection or mass lesion/mass effect. Similar nonspecific mineralization in the right cerebellum. Similar generalized cerebral atrophy with ex vacuo ventricular dilation. Similar mild patchy white matter hypoattenuation, nonspecific but most likely related to chronic microvascular ischemic disease. Partially empty sella. Vascular: No hyperdense vessel identified. Skull: No acute fracture. Sinuses/Orbits: Mild paranasal sinus mucosal thickening. Small/atretic left maxillary sinus Other: No mastoid effusions. ASPECTS [facility] Stroke Program Early CT Score) Total score (0-10 with 10 being normal): 10. IMPRESSION: No evidence of acute large vascular territory infarct or acute hemorrhage. ASPECTS is 10. Code stroke imaging results were communicated on  05/04/2021 at 6:44 pm to provider Dr. Rory Percy via secure text paging. Electronically Signed   By: Margaretha Sheffield MD   On: 05/04/2021 18:44   CT ANGIO HEAD NECK W WO CM (CODE STROKE)  Result Date: 05/04/2021 CLINICAL DATA:  Stroke follow-up. EXAM: CT ANGIOGRAPHY HEAD AND NECK TECHNIQUE: Multidetector CT imaging of the head and neck was performed using the standard protocol during bolus administration of intravenous contrast. Multiplanar CT image reconstructions and MIPs were obtained to evaluate the vascular anatomy. Carotid stenosis measurements (when applicable) are obtained utilizing NASCET criteria, using the distal internal carotid diameter as the denominator. CONTRAST:  72mL OMNIPAQUE IOHEXOL 350 MG/ML SOLN COMPARISON:  None. FINDINGS: CTA NECK FINDINGS Aortic arch: Great vessel origins are patent. 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 Right carotid system: Calcific and noncalcific tortuous atherosclerosis at the carotid bifurcation without greater than 50% stenosis. Tortuous ICA at the skull base. Left carotid system: Mild narrowing of the common carotid artery due to calcific atherosclerosis. Ulcerated calcific and noncalcific atherosclerosis at the carotid bifurcation without greater than 50% stenosis. Tortuous ICA at the skull base. Vertebral arteries: Mild stenosis of the right vertebral artery origin due to calcific atherosclerosis. Tortuous right vertebral artery without greater than 50% stenosis. Non dominant left vertebral artery is occluded at its origin with non opacification in neck. Faint reconstitution of the V3 and V4 vertebral artery, likely from collaterals. Skeleton: Moderate to severe multilevel degenerative disc disease, greatest at C3-C4, C4-C5 and C5-C6. Other neck: No acute findings. Upper chest: Visualized lung apices are clear. Review of the MIP images confirms the above findings CTA HEAD FINDINGS Anterior circulation: Bilateral intracranial ICAs, MCAs, and ACAs are  patent without proximal flow limiting stenosis. No aneurysm identified. Posterior circulation: Small left intradural vertebral artery. Right intradural vertebral artery is patent without significant stenosis. The basilar artery and bilateral posterior cerebral arteries are patent without hemodynamically significant proximal stenosis. Fetal type left PCA. Prominent right posterior communicating artery with small right P1 PCA. Small basilar artery. Venous sinuses: As permitted by contrast timing, patent. Anatomic variants: Detailed above. Review of the MIP images confirms the above findings IMPRESSION: CTA Head: No large vessel occlusion or proximal hemodynamically significant stenosis. CTA neck: 1. 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 2. Bilateral carotid bifurcation atherosclerosis without greater than 50% stenosis. 3. 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 Electronically Signed   By: Margaretha Sheffield MD   On: 05/04/2021 19:13    EKG: Independently reviewed.   Assessment/Plan Principal Problem:   Acute encephalopathy Active Problems:   Symptomatic bradycardia   Paroxysmal atrial flutter (HCC)   Heart block   COVID-19 virus infection    Acute encephalopathy - Either AMS due to COVID-19 and/or intermittent heart block (looks like a 2nd degree 2:1 with bradycardia in the 30s). H/o bradycardia in past: specifically SVR when he had A.Flutter in June of this year.  No where in chart do I see any mention of 2nd degree (or higher) heart block. Neuro concerned for possible hypoperfusion in setting of symptomatic bradycardia. Cards consulted and seeing pt: see their consult note for details. Symptomatic bradycardia / heart block with 2:1 conduction - HR going between 60s and 30s (has 2:1 heart block when in the 30s).  See rhythm strip above. Cards consult, seeing pt, see their consult note but per my Discussion with Dr.  Alfred Levins: Keep NPO but unlikely he will need cards procedure today No need for emergent PPM No need for vasopressor or other rate increasing agent. Check magnesium Check TSH (was normal in June) COVID-19 COVID pathway No O2 requirement currently Paxlovid Daily labs Cont pulse ox No O2 requirement yet, doesn't technically need steroids at this point. No remdesivir due to black box warning about remdesivir possibly causing bradycardia Not really candidate for MAB therapy since being admitted to hospital for complications which may or may not be related to COVID-19. PAF - Currently going between sinus rhythm and 2nd degree HB with 2:1 conduction (no fib or flutter today). S/p DCCV in June Hold eliquis and use heparin gtt instead due to using Paxlovid for COVID-19 treatment Not on any nodal blocking agents due to h/o A.Flutter with SVR. HTN - Holding losartan  DVT prophylaxis: Heparin gtt Code Status: Full Family Communication: No family in room Disposition Plan: Home after mental status improved, bradycardia addressed Consults called: Cards Dr. Alfred Levins Admission status: Place in obs     Treshaun Carrico, [place] [facility]  How to contact the [facility] Attending or Consulting provider [place] or covering provider during after hours [place], for this patient?  Check the care team in [facility] and look for a) attending/consulting [facility] provider listed and b) the [facility] team listed Log into [email or web address]  Amion Physician Scheduling and messaging for groups and whole hospitals  On call and physician scheduling software for group practices, residents, hospitalists and other medical providers for call, clinic, rotation and shift schedules. On[employer] is a hospital-wide system for scheduling doctors and paging doctors on call. EasyPlot is for scientific plotting and data analysis.  [email or web address]  and use [facility]'s universal password to  access. If you do not have the password, please contact the  hospital operator.  Locate the [facility] provider you are looking for under [facility] and page to a number that you can be directly reached. If you still have difficulty reaching the provider, please page the [facility] (Director on Call) for the Hospitalists listed on amion for assistance.  05/05/2021, 1:54 AM

## 2021-05-05 NOTE — Progress Notes (Signed)
[place] for heparin Indication:  Aflutter  No Known Allergies  Patient Measurements: Height: 5\' 8"  (172.7 cm) Weight: 84.4 kg (186 lb) IBW/kg (Calculated) : 68.4  Vital Signs: BP: 133/88 (08/10 1245) Pulse Rate: 56 (08/10 1245)  Labs: Recent Labs    05/04/21 1821 05/04/21 1828 05/04/21 2047 05/05/21 0357 05/05/21 1142  HGB 14.7 15.3  --  14.3  --   HCT 44.5 45.0  --  43.1  --   PLT 122*  --   --  107*  --   APTT 28  --   --   --  137*  LABPROT 15.4*  --   --   --   --   INR 1.2  --   --   --   --   HEPARINUNFRC  --   --   --   --  >1.10*  CREATININE 0.99 0.90  --  1.04  --   TROPONINIHS 12  --  15  --   --      Estimated Creatinine Clearance: 57.9 mL/min (by C-G formula based on SCr of 1.04 mg/dL).   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 signficant DDD present with vacuum effect L4-5 and L5-S1 flat discs and anterior spurring noted   Dizziness 05/06/2020   Dyslipidemia 03/18/2015   Dyspnea on exertion 04/02/2020   Ear itch 05/14/2018   Edema 11/16/2015   Edema of both lower  extremities due to peripheral venous insufficiency 02/03/2020   First degree AV block 10/20/2020   History of hiatal hernia    seen on last scan- as slight    History of thrombocytopenia    History of TIAs    Hyperlipidemia    Hypertension    Irritable bowel syndrome 11/17/2016   rec trial of citrucel/ diet 11/16/2016 >>>      Local edema 02/07/2018   Malaise and fatigue 12/08/2020   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 [place]. And Dr. 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 Renal parapelvic cysts 10/06/16 on CT , right kidney midpole 1.2 cm, right kidney anterior pole 1.1 cm, and to the left kidney lower pole anterior position 3.5cm all felt to be benign  Right bundle branch block 10/20/2020   Right lower lobe pulmonary nodule 11/09/2017   Shingles    internal- obstruction of bowels & gallbladder   Solitary pulmonary nodule 11/16/2016   CT ABd 03/24/09 linerar densities in RLL and RML c/w scarring o/w clear CT Abd 10/20/16 c/w 7 mm nodule  - CT chest 02/13/2017 :  slt enlarged to 9 mm   - PET 04/04/17 :1. These slowly enlarging 9 mm right lower lobe pulmonary nodule along the right hemidiaphragm does not appear hypermetabolic today. 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/17/2020   Stroke [facility])    Swelling of limb-Left foot 01/01/2015   Vitamin B12 deficiency 06/21/2017    Assessment: 80yo male presents to ED w/ AMS, found to be in first-degree heart block by EMS w/ rate in the 30s, improved only to 40s after atropine, found to be Covid positive with high risk of progressing to  severe dz >> to be treated with Paxlovid; Paxlovid cannot be administered w/ Eliquis, which pt takes for Aflutter (last dose taken 8/9 8a), so admitting DO requests change to UFH during five-day course of Paxlovid without options for other Covid therapy (remdesivir carries the risk of worsening bradycardia).  Of note pt was being evaluated for CVA/TIA but MRI is negative.  Initial aPTT supratherapeutic at 137 seconds, Anti-Xa level elevated as expected with Eliquis use  Goal of Therapy:  Heparin level 0.3-0.7 units/ml aPTT 66-102 seconds Monitor platelets by anticoagulation protocol: Yes   Plan:  Decrease heparin gtt to 1100 units/hr F/u 8 hour aPTT F/u paxlovid plan and ability to restart Eliquis  Bertis Ruddy, PharmD Clinical Pharmacist ED Pharmacist Phone # [phone] 05/05/2021 1:17 PM

## 2021-05-05 NOTE — Progress Notes (Signed)
ANTICOAGULATION CONSULT NOTE - Initial Consult  Pharmacy Consult for heparin Indication:  Aflutter  No Known Allergies  Patient Measurements: Height: 5\' 8"  (172.7 cm) Weight: 84.4 kg (186 lb) IBW/kg (Calculated) : 68.4  Vital Signs: Temp: 97.6 F (36.4 C) (08/09 1900) Temp Source: Oral (08/09 1900) BP: 128/54 (08/10 0030) Pulse Rate: 36 (08/10 0035)  Labs: Recent Labs    05/04/21 1821 05/04/21 1828 05/04/21 2047  HGB 14.7 15.3  --   HCT 44.5 45.0  --   PLT 122*  --   --   APTT 28  --   --   LABPROT 15.4*  --   --   INR 1.2  --   --   CREATININE 0.99 0.90  --   TROPONINIHS 12  --  15    Estimated Creatinine Clearance: 67 mL/min (by C-G formula based on SCr of 0.9 mg/dL).   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 signficant DDD present with vacuum effect L4-5 and L5-S1 flat discs and anterior spurring noted   Dizziness 05/06/2020   Dyslipidemia 03/18/2015   Dyspnea on exertion 04/02/2020   Ear itch 05/14/2018   Edema 11/16/2015   Edema of both lower extremities due to peripheral venous insufficiency 02/03/2020   First  degree AV block 10/20/2020   History of hiatal hernia    seen on last scan- as slight    History of thrombocytopenia    History of TIAs    Hyperlipidemia    Hypertension    Irritable bowel syndrome 11/17/2016   rec trial of citrucel/ diet 11/16/2016 >>>      Local edema 02/07/2018   Malaise and fatigue 12/08/2020   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 [place]. And Dr. 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 Renal parapelvic cysts 10/06/16 on CT , right kidney midpole 1.2 cm, right kidney anterior pole 1.1 cm, and to the left kidney lower pole anterior position 3.5cm all felt to be benign   Right bundle branch block 10/20/2020   Right lower lobe pulmonary nodule 11/09/2017   Shingles    internal- obstruction of bowels & gallbladder   Solitary pulmonary nodule 11/16/2016   CT ABd 03/24/09 linerar densities in RLL and RML c/w  scarring o/w clear CT Abd 10/20/16 c/w 7 mm nodule  - CT chest 02/13/2017 :  slt enlarged to 9 mm   - PET 04/04/17 :1. These slowly enlarging 9 mm right lower lobe pulmonary nodule along the right hemidiaphragm does not appear hypermetabolic today. Location adjacent to the hemidiaphragm can cause false negatives due to motion artifact related to the   Stage 3a chronic kidney disease ([place]) 06/17/2020   Stroke [facility])    Swelling of limb-Left foot 01/01/2015   Vitamin B12 deficiency 06/21/2017    Assessment: 80yo male presents to ED w/ AMS, found to be in first-degree heart block by EMS w/ rate in the 30s, improved only to 40s after atropine, found to be Covid positive with high risk of progressing to severe dz >> to be treated with Paxlovid; Paxlovid cannot be administered  w/ Eliquis, which pt takes for Aflutter (last dose taken 8/9 8a), so admitting DO requests change to UFH during five-day course of Paxlovid without options for other Covid therapy (remdesivir carries the risk of worsening bradycardia).  Of note pt was being evaluated for CVA/TIA but MRI is negative.  Goal of Therapy:  Heparin level 0.3-0.7 units/ml aPTT 66-102 seconds Monitor platelets by anticoagulation protocol: Yes   Plan:  Heparin infusion at 1200 units/hr and monitor heparin levels, aPTT (while apixaban affects anti-Xa), and CBC. Resume home Eliquis after 5 days of Paxlovid; if pt is discharged prior to the 5-day course, consider LMWH vs adjusted Eliquis dose (2.5mg  BID has been used for administering interacting meds) vs holding Eliquis until course complete.  Wynona Neat, PharmD, BCPS  05/05/2021,2:58 AM

## 2021-05-06 ENCOUNTER — Inpatient Hospital Stay (HOSPITAL_COMMUNITY): Payer: Medicare Other

## 2021-05-06 ENCOUNTER — Other Ambulatory Visit (HOSPITAL_COMMUNITY): Payer: Self-pay

## 2021-05-06 ENCOUNTER — Ambulatory Visit: Payer: Medicare Other | Admitting: Cardiology

## 2021-05-06 LAB — CBC WITH DIFFERENTIAL/PLATELET
Abs Immature Granulocytes: 0.01 10*3/uL (ref 0.00–0.07)
Basophils Absolute: 0 10*3/uL (ref 0.0–0.1)
Basophils Relative: 0 %
Eosinophils Absolute: 0.1 10*3/uL (ref 0.0–0.5)
Eosinophils Relative: 1 %
HCT: 42.8 % (ref 39.0–52.0)
Hemoglobin: 14.2 g/dL (ref 13.0–17.0)
Immature Granulocytes: 0 %
Lymphocytes Relative: 26 %
Lymphs Abs: 1.8 10*3/uL (ref 0.7–4.0)
MCH: 30.5 pg (ref 26.0–34.0)
MCHC: 33.2 g/dL (ref 30.0–36.0)
MCV: 92 fL (ref 80.0–100.0)
Monocytes Absolute: 1 10*3/uL (ref 0.1–1.0)
Monocytes Relative: 16 %
Neutro Abs: 3.8 10*3/uL (ref 1.7–7.7)
Neutrophils Relative %: 57 %
Platelets: 99 10*3/uL — ABNORMAL LOW (ref 150–400)
RBC: 4.65 MIL/uL (ref 4.22–5.81)
RDW: 14.2 % (ref 11.5–15.5)
WBC: 6.7 10*3/uL (ref 4.0–10.5)
nRBC: 0 % (ref 0.0–0.2)

## 2021-05-06 LAB — COMPREHENSIVE METABOLIC PANEL
ALT: 14 U/L (ref 0–44)
AST: 22 U/L (ref 15–41)
Albumin: 2.9 g/dL — ABNORMAL LOW (ref 3.5–5.0)
Alkaline Phosphatase: 32 U/L — ABNORMAL LOW (ref 38–126)
Anion gap: 10 (ref 5–15)
BUN: 11 mg/dL (ref 8–23)
CO2: 24 mmol/L (ref 22–32)
Calcium: 8.5 mg/dL — ABNORMAL LOW (ref 8.9–10.3)
Chloride: 105 mmol/L (ref 98–111)
Creatinine, Ser: 0.82 mg/dL (ref 0.61–1.24)
GFR, Estimated: >60 mL/min (ref >60)
Glucose, Bld: 86 mg/dL (ref 70–99)
Potassium: 3.7 mmol/L (ref 3.5–5.1)
Sodium: 139 mmol/L (ref 135–145)
Total Bilirubin: 0.9 mg/dL (ref 0.3–1.2)
Total Protein: 5.7 g/dL — ABNORMAL LOW (ref 6.5–8.1)

## 2021-05-06 LAB — HEPARIN LEVEL (UNFRACTIONATED): Heparin Unfractionated: 0.66 IU/mL (ref 0.30–0.70)

## 2021-05-06 LAB — D-DIMER, QUANTITATIVE: D-Dimer, Quant: 0.32 ug/mL-FEU (ref 0.00–0.50)

## 2021-05-06 LAB — APTT: aPTT: 101 seconds — ABNORMAL HIGH (ref 24–36)

## 2021-05-06 LAB — C-REACTIVE PROTEIN: CRP: 7.7 mg/dL — ABNORMAL HIGH (ref <1.0)

## 2021-05-06 MED ORDER — APIXABAN 5 MG PO TABS: 5.0000 mg | ORAL_TABLET | Freq: Two times a day (BID) | ORAL | 3 refills | Status: DC

## 2021-05-06 MED ORDER — LOSARTAN POTASSIUM 50 MG PO TABS
100.0000 mg | ORAL_TABLET | Freq: Every day | ORAL | Status: DC
  Administered 2021-05-06: 100 mg via ORAL
  Filled 2021-05-06: qty 2

## 2021-05-06 MED ORDER — ROSUVASTATIN CALCIUM 40 MG PO TABS: 40.0000 mg | ORAL_TABLET | Freq: Every day | ORAL | Status: AC

## 2021-05-06 MED ORDER — NIRMATRELVIR/RITONAVIR (PAXLOVID)TABLET
3.0000 | ORAL_TABLET | Freq: Two times a day (BID) | ORAL | 0 refills | Status: AC
  Filled 2021-05-06: qty 30, 5d supply, fill #0

## 2021-05-06 NOTE — Progress Notes (Signed)
[place] for heparin Indication:  Aflutter  No Known Allergies  Patient Measurements: Height: 5\' 8"  (172.7 cm) Weight: 84.4 kg (186 lb) IBW/kg (Calculated) : 68.4  Vital Signs: Temp: 98 F (36.7 C) (08/10 2345) Temp Source: Oral (08/10 2345) BP: 134/65 (08/11 0300) Pulse Rate: 56 (08/11 0300)  Labs: Recent Labs    05/04/21 1821 05/04/21 1828 05/04/21 2047 05/05/21 0357 05/05/21 1142 05/05/21 2036 05/06/21 0604  HGB 14.7 15.3  --  14.3  --   --  14.2  HCT 44.5 45.0  --  43.1  --   --  42.8  PLT 122*  --   --  107*  --   --  99*  APTT 28  --   --   --  137* 136* 101*  LABPROT 15.4*  --   --   --   --   --   --   INR 1.2  --   --   --   --   --   --   HEPARINUNFRC  --   --   --   --  >1.10* >1.10* 0.66  CREATININE 0.99 0.90  --  1.04  --   --  0.82  TROPONINIHS 12  --  15  --   --   --   --      Estimated Creatinine Clearance: 73.5 mL/min (by C-G formula based on SCr of 0.82 mg/dL).   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 signficant DDD present with vacuum effect L4-5 and L5-S1  flat discs and anterior spurring noted   Dizziness 05/06/2020   Dyslipidemia 03/18/2015   Dyspnea on exertion 04/02/2020   Ear itch 05/14/2018   Edema 11/16/2015   Edema of both lower extremities due to peripheral venous insufficiency 02/03/2020   First degree AV block 10/20/2020   History of hiatal hernia    seen on last scan- as slight    History of thrombocytopenia    History of TIAs    Hyperlipidemia    Hypertension    Irritable bowel syndrome 11/17/2016   rec trial of citrucel/ diet 11/16/2016 >>>      Local edema 02/07/2018   Malaise and fatigue 12/08/2020   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 [place]. And Dr. 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 These slowly enlarging 9 mm right lower lobe pulmonary nodule along the right hemidiaphragm does not appear hypermetabolic today. Location adjacent to the hemidiaphragm can cause false negatives due to motion artifact related to the   Stage 3a chronic kidney disease ([place]) 06/17/2020   Stroke [facility])    Swelling of limb-Left foot 01/01/2015   Vitamin B12 deficiency 06/21/2017    Assessment: 80yo male presents  to ED w/ AMS, found to be in first-degree heart block by EMS w/ rate in the 30s, improved only to 40s after atropine, found to be Covid positive with high risk of progressing to severe dz >> to be treated with Paxlovid; Paxlovid cannot be administered w/ Eliquis, which pt takes for Aflutter (last dose taken 8/9 8a), so admitting DO requests change to UFH during five-day course of Paxlovid without options for other Covid therapy (remdesivir carries the risk of worsening bradycardia).  Of note pt was being evaluated for CVA/TIA but MRI is negative.  aPTT came back therapeutic at 101, heparin level also came back therapeutic at 0.66, on 900 units/hr. Hgb 14.2, plt 99. No s/sx of bleeding or infusion issues.   Goal of Therapy:  Heparin level 0.3-0.7 units/ml aPTT 66-102 seconds Monitor platelets by anticoagulation protocol: Yes   Plan:  Continue heparin infusion at 900 units/h Recheck aPTT and heparin level in 8h Monitor HL, CBC, and for s/sx of bleeding  Antonietta Jewel, PharmD, [place] Pharmacist  Phone: [phone] 05/06/2021 8:09 AM  Please check AMION for all [place] phone numbers After 10:00 PM, call [place] [phone]

## 2021-05-06 NOTE — Evaluation (Signed)
Physical Therapy Evaluation Patient Details Name: Louis Barton MRN: [id number] DOB: 1941-09-12 Today's Date: 05/06/2021   History of Present Illness  Pt is an 80 y.o. male admitted 05/04/21 with generalized weakness, abdominal pain; code stroke called in ED due to confusion, difficulty word finding. Brain MRI negative for acute finding. Workup for acute encephalopathy, symptomatic bradycardia, (+) COVID-19. PMH includes aflutter on eliquis (s/p DCCV 02/2021), bradycardia, HTN.   Clinical Impression  Pt presents with an overall decrease in functional mobility secondary to above. PTA, pt independent, working in [employer], lives at home with wife. Today, pt tolerating short bout of activity with min guard; reports primarily limited by fatigue and soreness "since I haven't been up since Monday." Expect pt to progress well with mobility the more he is up and moving. Pt would benefit from continued acute PT services to maximize functional mobility and independence prior to d/c with HHPT services.  HR 60s SpO2 90-96% on RA    Follow Up Recommendations Home health PT;Supervision - Intermittent    Equipment Recommendations  None recommended by PT    Recommendations for Other Services       Precautions / Restrictions Precautions Precautions: Fall Restrictions Weight Bearing Restrictions: No      Mobility  Bed Mobility Overal bed mobility: Modified Independent             General bed mobility comments: HOB elevated    Transfers Overall transfer level: Needs assistance Equipment used: None Transfers: Sit to/from Stand Sit to Stand: Min guard         General transfer comment: Increased time and effort, pt attributes to hip/generalized soreness ("This is my first time up since Monday")  Ambulation/Gait Ambulation/Gait assistance: Min guard Gait Distance (Feet): 12 Feet Assistive device: None Gait Pattern/deviations: Step-through pattern;Decreased stride length;Antalgic;Trunk  flexed Gait velocity: Decreased   General Gait Details: Slow, stiff gait without DME, intermittent UE support on bed rail for stability; min guard for balance; pt declined further distance wanting to eat breakfast  Stairs            Wheelchair Mobility    Modified Rankin (Stroke Patients Only)       Balance Overall balance assessment: Needs assistance   Sitting balance-Leahy Scale: Good     Standing balance support: No upper extremity supported;During functional activity Standing balance-Leahy Scale: Fair                               Pertinent Vitals/Pain Pain Assessment: Faces Faces Pain Scale: Hurts a little bit Pain Location: R hip Pain Descriptors / Indicators: Sore Pain Intervention(s): Monitored during session    Home Living Family/patient expects to be discharged to:: Private residence Living Arrangements: Spouse/significant other Available Help at Discharge: Family;Available 24 hours/day Type of Home: House Home Access: Stairs to enter     Home Layout: One level Home Equipment: Cane - single point;Walker - 2 wheels;Shower seat;Walker - 4 wheels      Prior Function Level of Independence: Independent         Comments: Still working in [place]; drives. Denies recent falls. Frustrated by long-term effects of COVID ("we've tried every test known to man, and we've still got dizziness")     Hand Dominance   Dominant Hand: Right    Extremity/Trunk Assessment   Upper Extremity Assessment Upper Extremity Assessment: Overall WFL for tasks assessed    Lower Extremity Assessment Lower Extremity Assessment:  Generalized weakness       Communication   Communication: No difficulties  Cognition Arousal/Alertness: Awake/alert Behavior During Therapy: WFL for tasks assessed/performed Overall Cognitive Status: Within Functional Limits for tasks assessed                                 General Comments: WFL  for simple tasks; required some redirection in conversation      General Comments General comments (skin integrity, edema, etc.): Discussed d/c planning and potential DME needs; pt willing to consider HHPT services if needed. HR 60s; SpO2 >/90% on RA; productive cough, which pt attributes to administration of COVID test (not from [place] itself)    Exercises     Assessment/Plan    PT Assessment Patient needs continued PT services  PT Problem List Decreased strength;Decreased activity tolerance;Decreased balance;Decreased mobility;Cardiopulmonary status limiting activity       PT Treatment Interventions DME instruction;Gait training;Stair training;Functional mobility training;Therapeutic activities;Therapeutic exercise;Balance training;Patient/family education    PT Goals (Current goals can be found in the Care Plan section)  Acute Rehab PT Goals Patient Stated Goal: Return home, back to work PT Goal Formulation: With patient Time For Goal Achievement: 05/20/21 Potential to Achieve Goals: Good    Frequency Min 3X/week   Barriers to discharge        Co-evaluation               AM-PAC PT "6 Clicks" Mobility  Outcome Measure Help needed turning from your back to your side while in a flat bed without using bedrails?: None Help needed moving from lying on your back to sitting on the side of a flat bed without using bedrails?: None Help needed moving to and from a bed to a chair (including a wheelchair)?: A Little Help needed standing up from a chair using your arms (e.g., wheelchair or bedside chair)?: A Little Help needed to walk in hospital room?: A Little Help needed climbing 3-5 steps with a railing? : A Little 6 Click Score: 20    End of Session   Activity Tolerance: Patient tolerated treatment well;Patient limited by fatigue Patient left: in chair;with call bell/phone within reach Nurse Communication: Mobility status PT Visit Diagnosis: Other abnormalities of gait  and mobility (R26.89);Muscle weakness (generalized) (M62.81)    Time: 6203-5597 PT Time Calculation (min) (ACUTE ONLY): 21 min   Charges:   PT Evaluation $PT Eval Moderate Complexity: 1 Mod     Mabeline Caras, PT, DPT Acute Rehabilitation Services  Pager [phone] Office [place] 05/06/2021, 8:45 AM

## 2021-05-06 NOTE — Progress Notes (Signed)
Progress Note  Patient Name: Louis Barton Date of Encounter: 05/06/2021  Primary Cardiologist: Jenne Campus, MD   Subjective   No events over night.  Patient and daughter have many questions: daugther notes that when patient's heart rate is low he feels more sluggish and this inhibits his working out.  Patient notes that he is urinating a lot on the fluid pill.  Note symptoms with PVCs  Inpatient Medications    Scheduled Meds:  furosemide  40 mg Oral Daily   nirmatrelvir/ritonavir EUA  3 tablet Oral BID   polyvinyl alcohol  1 drop Both Eyes Daily   Continuous Infusions:  sodium chloride 100 mL/hr (05/05/21 1250)   heparin 900 Units/hr (05/05/21 2155)   PRN Meds: acetaminophen, ondansetron **OR** ondansetron (ZOFRAN) IV   Vital Signs    Vitals:   05/05/21 1624 05/05/21 2000 05/05/21 2345 05/06/21 0300  BP: (!) 167/61 124/85 (!) 157/67 134/65  Pulse: (!) 50 (!) 49 62 (!) 56  Resp: 20 (!) 24 19 20   Temp: (!) 97.5 F (36.4 C) 97.7 F (36.5 C) 98 F (36.7 C)   TempSrc: Oral Oral Oral   SpO2: 95% 94% 93% 93%  Weight:      Height:        Intake/Output Summary (Last 24 hours) at 05/06/2021 0748 Last data filed at 05/05/2021 1800 Gross per 24 hour  Intake 1240 ml  Output 1625 ml  Net -385 ml   Filed Weights   05/05/21 0115  Weight: 84.4 kg    Telemetry    Sinus rhythm with Mobitz Type I HB, 2:1 HB (mislabeled as Mobitz Type II) and PVCs; no VTACH  - Personally Reviewed  ECG    No new last 24 hours - Personally Reviewed  Physical Exam   GEN: No acute distress.   Neck: No JVD Cardiac: regular rate and rhythm , no murmurs, rubs, or gallops.  Respiratory: Clear to auscultation bilaterally. GI: Soft, nontender, non-distended  MS: Trace non pitting edema; No deformity. Neuro:  Nonfocal  Psych: Normal affect   Labs    Chemistry Recent Labs  Lab 05/04/21 1821 05/04/21 1828 05/05/21 0357 05/06/21 0604  NA 137 139 138 139  K 3.9 3.9 4.2 3.7  CL  105 104 104 105  CO2 26  --  25 24  GLUCOSE 108* 106* 99 86  BUN 13 13 11 11   CREATININE 0.99 0.90 1.04 0.82  CALCIUM 8.5*  --  8.5* 8.5*  PROT 5.8*  --  5.6* 5.7*  ALBUMIN 3.2*  --  3.0* 2.9*  AST 21  --  19 22  ALT 13  --  13 14  ALKPHOS 40  --  34* 32*  BILITOT 0.8  --  0.5 0.9  GFRNONAA >60  --  >60 >60  ANIONGAP 6  --  9 10     Hematology Recent Labs  Lab 05/04/21 1821 05/04/21 1828 05/05/21 0357 05/06/21 0604  WBC 8.8  --  7.4 6.7  RBC 4.79  --  4.63 4.65  HGB 14.7 15.3 14.3 14.2  HCT 44.5 45.0 43.1 42.8  MCV 92.9  --  93.1 92.0  MCH 30.7  --  30.9 30.5  MCHC 33.0  --  33.2 33.2  RDW 13.8  --  14.2 14.2  PLT 122*  --  107* 99*    Cardiac EnzymesNo results for input(s): TROPONINI in the last 168 hours. No results for input(s): TROPIPOC in the last 168 hours.  BNPNo results for input(s): BNP, PROBNP in the last 168 hours.   DDimer  Recent Labs  Lab 05/05/21 0357 05/06/21 0604  DDIMER 0.33 0.32     Radiology    MR BRAIN WO CONTRAST  Result Date: 05/04/2021 CLINICAL DATA:  Initial evaluation for acute TIA. EXAM: MRI HEAD WITHOUT CONTRAST TECHNIQUE: Multiplanar, multiecho pulse sequences of the brain and surrounding structures were obtained without intravenous contrast. COMPARISON:  CTs from earlier the same day. Comparison also made with prior MRI from 05/13/2020. FINDINGS: Brain: Cerebral volume within normal limits for age. Scattered patchy T2/FLAIR hyperintensity involving the periventricular and deep white matter both cerebral hemispheres most consistent with chronic small vessel ischemic disease, mild for age. Few small superimposed remote lacunar infarcts noted about the right caudate. Two tiny remote cortical infarcts at the high right frontal lobe noted as well, unchanged. No abnormal foci of restricted diffusion to suggest acute or subacute ischemia. Gray-white matter differentiation maintained. No encephalomalacia to suggest chronic cortical infarction.  No acute intracranial hemorrhage. Chronic hemosiderin staining/siderosis seen along the cortical sulcus at the right frontal lobe, suggesting prior subarachnoid hemorrhage at this location, stable (series 13, image 39). No acute hemorrhage seen within this region on prior CT. No other foci of susceptibility artifact to suggest acute or chronic intracranial hemorrhage. No mass lesion, midline shift or mass effect. No hydrocephalus or extra-axial fluid collection. Pituitary gland suprasellar region normal. Midline structures intact. Vascular: Irregular flow void within the hypoplastic left V4 segment, consistent with previously identified occlusion. Major intracranial vascular flow voids are otherwise maintained. Skull and upper cervical spine: Craniocervical junction within normal limits. Bone marrow signal intensity normal. No scalp soft tissue abnormality. Sinuses/Orbits: Globes and orbital soft tissues demonstrate no acute finding. Scattered mucosal thickening noted within the ethmoidal air cells and maxillary sinuses. Paranasal sinuses otherwise clear. Trace left mastoid effusion noted, of doubtful significance. Inner ear structures grossly normal. Other: None. IMPRESSION: 1. No acute intracranial abnormality. 2. Chronic hemosiderin staining along a cortical sulcus at the right frontal lobe, suggesting prior subarachnoid hemorrhage at this location. No acute hemorrhage seen within this region on prior CT. 3. Underlying mild chronic microvascular ischemic disease for age. Electronically Signed   By: Jeannine Boga M.D.   On: 05/04/2021 23:16   DG CHEST PORT 1 VIEW  Result Date: 05/05/2021 CLINICAL DATA:  COVID-19 positivity EXAM: PORTABLE CHEST 1 VIEW COMPARISON:  03/18/2021 FINDINGS: Cardiac shadow is prominent but accentuated by the portable technique. Aortic calcifications are noted. Lungs are well aerated bilaterally and again demonstrate chronic scarring and pleural thickening in the right lung base  stable from the prior exam. No new focal abnormality is seen. IMPRESSION: Chronic changes in the right base. No acute abnormality noted. Electronically Signed   By: Inez Catalina M.D.   On: 05/05/2021 01:27   CT HEAD CODE STROKE WO CONTRAST  Result Date: 05/04/2021 CLINICAL DATA:  Code stroke.  Neuro deficit, acute, stroke suspected EXAM: CT HEAD WITHOUT CONTRAST TECHNIQUE: Contiguous axial images were obtained from the base of the skull through the vertex without intravenous contrast. COMPARISON:  03/26/2021. FINDINGS: Brain: No evidence of acute large vascular territory infarction, hemorrhage, hydrocephalus, extra-axial collection or mass lesion/mass effect. Similar nonspecific mineralization in the right cerebellum. Similar generalized cerebral atrophy with ex vacuo ventricular dilation. Similar mild patchy white matter hypoattenuation, nonspecific but most likely related to chronic microvascular ischemic disease. Partially empty sella. Vascular: No hyperdense vessel identified. Skull: No acute fracture. Sinuses/Orbits: Mild paranasal sinus mucosal thickening.  Small/atretic left maxillary sinus Other: No mastoid effusions. ASPECTS [facility] Stroke Program Early CT Score) Total score (0-10 with 10 being normal): 10. IMPRESSION: No evidence of acute large vascular territory infarct or acute hemorrhage. ASPECTS is 10. Code stroke imaging results were communicated on 05/04/2021 at 6:44 pm to provider Dr. Rory Percy via secure text paging. Electronically Signed   By: Margaretha Sheffield MD   On: 05/04/2021 18:44   CT ANGIO HEAD NECK W WO CM (CODE STROKE)  Result Date: 05/04/2021 CLINICAL DATA:  Stroke follow-up. EXAM: CT ANGIOGRAPHY HEAD AND NECK TECHNIQUE: Multidetector CT imaging of the head and neck was performed using the standard protocol during bolus administration of intravenous contrast. Multiplanar CT image reconstructions and MIPs were obtained to evaluate the vascular anatomy. Carotid stenosis measurements (when  applicable) are obtained utilizing NASCET criteria, using the distal internal carotid diameter as the denominator. CONTRAST:  47mL OMNIPAQUE IOHEXOL 350 MG/ML SOLN COMPARISON:  None. FINDINGS: CTA NECK FINDINGS Aortic arch: Great vessel origins are patent. 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 Right carotid system: Calcific and noncalcific tortuous atherosclerosis at the carotid bifurcation without greater than 50% stenosis. Tortuous ICA at the skull base. Left carotid system: Mild narrowing of the common carotid artery due to calcific atherosclerosis. Ulcerated calcific and noncalcific atherosclerosis at the carotid bifurcation without greater than 50% stenosis. Tortuous ICA at the skull base. Vertebral arteries: Mild stenosis of the right vertebral artery origin due to calcific atherosclerosis. Tortuous right vertebral artery without greater than 50% stenosis. Non dominant left vertebral artery is occluded at its origin with non opacification in neck. Faint reconstitution of the V3 and V4 vertebral artery, likely from collaterals. Skeleton: Moderate to severe multilevel degenerative disc disease, greatest at C3-C4, C4-C5 and C5-C6. Other neck: No acute findings. Upper chest: Visualized lung apices are clear. Review of the MIP images confirms the above findings CTA HEAD FINDINGS Anterior circulation: Bilateral intracranial ICAs, MCAs, and ACAs are patent without proximal flow limiting stenosis. No aneurysm identified. Posterior circulation: Small left intradural vertebral artery. Right intradural vertebral artery is patent without significant stenosis. The basilar artery and bilateral posterior cerebral arteries are patent without hemodynamically significant proximal stenosis. Fetal type left PCA. Prominent right posterior communicating artery with small right P1 PCA. Small basilar artery. Venous sinuses: As permitted by contrast timing, patent. Anatomic variants: Detailed above. Review of the MIP  images confirms the above findings IMPRESSION: CTA Head: No large vessel occlusion or proximal hemodynamically significant stenosis. CTA neck: 1. 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 2. Bilateral carotid bifurcation atherosclerosis without greater than 50% stenosis. 3. 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 Electronically Signed   By: Margaretha Sheffield MD   On: 05/04/2021 19:13    Cardiac Studies   Cardiac Event Monitoring: Date: 11/03/20 Results: Summary and conclusions: 1 episode of  ventricular tachycardia but only 5 beats, asymptomatic. 1 run of supraventricular tachycardia, only 4 beats, asymptomatic. First-degree AV block noted. Second-degree type I AV block noted during the night however one episode happened at 10:30 in the morning.  Asymptomatic.  Transthoracic Echocardiogram: Date: 03/19/2021 Results . Left ventricular ejection fraction, by estimation, is 60 to 65%. The  left ventricle has normal function. The left ventricle has no regional  wall motion abnormalities. There is mild left ventricular hypertrophy.  Left ventricular diastolic parameters  are indeterminate.   2. Right ventricular systolic function is normal. The right ventricular  size is  normal.   3. The mitral valve is normal in structure. Mild mitral valve  regurgitation. No evidence of mitral stenosis.   4. The aortic valve is calcified. Aortic valve regurgitation is trivial.  Mild to moderate aortic valve sclerosis/calcification is present, without  any evidence of aortic stenosis.   5. The inferior vena cava is normal in size with greater than 50%  respiratory variability, suggesting right atrial pressure of 3 mmHg. :  Transesophageal Echocardiogram: Date: 03/22/21 Results:   1. Left ventricular ejection fraction, by estimation, is 55 to 60%. The  left ventricle has normal function.   2. Right ventricular systolic function is  normal. The right ventricular  size is normal.   3. Left atrial size was mildly dilated. No left atrial/left atrial  appendage thrombus was detected.   4. Right atrial size was mildly dilated.   5. The mitral valve is normal in structure. Mild mitral valve  regurgitation.   6. The aortic valve is tricuspid. Aortic valve regurgitation is mild.  Mild aortic valve sclerosis is present, with no evidence of aortic valve  stenosis.   7. There is Moderate (Grade III) plaque involving the descending aorta.    NM Stress Testing : Date: 12/31/20 Results: Nuclear stress EF: 76%. There was no ST segment deviation noted during stress. Defect 1: There is a medium defect of moderate severity present in the basal inferior, mid inferior and apical inferior location. Findings consistent with ischemia. This is an intermediate risk study.  Left/Right Heart Catheterizations: Date: 02/08/2021 Results: Mid Cx to Dist Cx lesion is 20% stenosed. Prox LAD to Mid LAD lesion is 20% stenosed.   1. Mild non-obstructive CAD 2. Mild elevation LVEDP   Recommendations: Medical management of mild CAD     Patient Profile     80 y.o. 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  - CHADSVASC 6-7 (query of prior HFpEF) - has conduction disease without PPM indication presently; asymptomatic and 2:1 is largely nocturnal  - given risks for tachy-brady syndrome and description of chronotropic incompentence, will arrange outpatient EP f/u - AC per primary given concomitant COVID-19 receiving therapy   Non Obstructive CAD CAS, hx of TIA HTN Aortic atherosclerosis COVID-19  - AC as above, no ASA - will return home losartan today (100mg  PO Daily; ordered) - When finished with Paxlovid, would return  rosuvastatin  If continues to do well, will sign off 05/07/21 Arranging f/u with Dr. Edyth Gunnels  For questions or updates, please contact [facility] Please consult [email or web address] for contact info under Cardiology/STEMI.      Signed, Werner Lean, MD  05/06/2021, 7:48 AM

## 2021-05-06 NOTE — TOC Transition Note (Signed)
Transition of Care [facility]) - CM/SW Discharge Note   Patient Details  Name: Louis Barton MRN: [id number] Date of Birth: [birth year]  Transition of Care [facility]) CM/SW Contact:  Verdell Carmine, RN Phone Number: 05/06/2021, 3:38 PM   Clinical Narrative:     Called patient to discuss PT home health services. He stated he has not had this before and  does not want to think about it right now maybe tomorrow. I offered to read the listings off of medicare.gov for [facility]. He did not want to partake in this and said he would call his doctor for outpatient therapy later.        Patient Goals and CMS Choice        Discharge Placement                       Discharge Plan and Services                                     Social Determinants of Health (SDOH) Interventions     Readmission Risk Interventions No flowsheet data found.

## 2021-05-06 NOTE — Plan of Care (Signed)
  Problem: Education: Goal: Knowledge of General Education information will improve Description: Including pain rating scale, medication(s)/side effects and non-pharmacologic comfort measures Outcome: Completed/Met   Problem: Health Behavior/Discharge Planning: Goal: Ability to manage health-related needs will improve Outcome: Completed/Met   Problem: Clinical Measurements: Goal: Ability to maintain clinical measurements within normal limits will improve Outcome: Completed/Met Goal: Will remain free from infection Outcome: Completed/Met Goal: Diagnostic test results will improve Outcome: Completed/Met Goal: Respiratory complications will improve Outcome: Completed/Met Goal: Cardiovascular complication will be avoided Outcome: Completed/Met   Problem: Activity: Goal: Risk for activity intolerance will decrease Outcome: Completed/Met   Problem: Nutrition: Goal: Adequate nutrition will be maintained Outcome: Completed/Met   Problem: Coping: Goal: Level of anxiety will decrease Outcome: Completed/Met   Problem: Elimination: Goal: Will not experience complications related to bowel motility Outcome: Completed/Met Goal: Will not experience complications related to urinary retention Outcome: Completed/Met   Problem: Pain Managment: Goal: General experience of comfort will improve Outcome: Completed/Met   Problem: Safety: Goal: Ability to remain free from injury will improve Outcome: Completed/Met   Problem: Skin Integrity: Goal: Risk for impaired skin integrity will decrease Outcome: Completed/Met   Problem: Acute Rehab PT Goals(only PT should resolve) Goal: Patient Will Transfer Sit To/From Stand Outcome: Completed/Met Goal: Pt Will Ambulate Outcome: Completed/Met   Problem: Acute Rehab OT Goals (only OT should resolve) Goal: Pt. Will Perform Grooming Outcome: Completed/Met Goal: Pt. Will Perform Lower Body Dressing Outcome: Completed/Met Goal: Pt. Will Transfer  To Toilet Outcome: Completed/Met Goal: Pt. Will Perform Tub/Shower Transfer Outcome: Completed/Met Goal: OT Additional ADL Goal #1 Outcome: Completed/Met

## 2021-05-06 NOTE — Evaluation (Signed)
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 signficant DDD present with vacuum effect L4-5 and L5-S1 flat discs and anterior spurring noted   Dizziness 05/06/2020   Dyslipidemia 03/18/2015   Dyspnea on exertion 04/02/2020   Ear itch 05/14/2018   Edema 11/16/2015   Edema of both lower extremities due to peripheral venous insufficiency 02/03/2020   First degree AV block 10/20/2020   History of hiatal hernia    seen on last scan- as slight    History of thrombocytopenia    History of TIAs    Hyperlipidemia    Hypertension    Irritable bowel syndrome 11/17/2016   rec trial of citrucel/ diet 11/16/2016 >>>      Local edema 02/07/2018   Malaise and fatigue 12/08/2020   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 [place]. And Dr. 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 These slowly enlarging 9 mm right lower lobe pulmonary nodule along the right hemidiaphragm does not appear hypermetabolic today. Location adjacent to the hemidiaphragm can cause false negatives due to motion artifact related to the   Stage 3a chronic kidney disease ([place]) 06/17/2020   Stroke ([place])    Swelling of limb-Left foot 01/01/2015   Vitamin B12 deficiency 06/21/2017   Past Surgical History:  Past Surgical History:  Procedure Laterality Date   adenocarcinoma  2019   Removed  CARDIOVERSION N/A 03/22/2021   Procedure: CARDIOVERSION;  Surgeon: Lelon Perla, MD;  Location: [facility] ENDOSCOPY;  Service: Cardiovascular;  Laterality: N/A;   CAROTID ENDARTERECTOMY  2005   Left carotid by Dr. Oneida Alar   CAROTID ENDARTERECTOMY  04/25/11   Right Carotid by Dr. Oneida Alar   COLONOSCOPY  02/23/2017   Colonic polyp status post polypectomy. Pancolonic diverticulosis predominatly in the sigmoid colon. Tubular adenoma   LEFT HEART CATH AND CORONARY ANGIOGRAPHY N/A 02/08/2021   Procedure: LEFT HEART CATH AND CORONARY ANGIOGRAPHY;  Surgeon: Burnell Blanks, MD;  Location: [place] [facility];   Service: Cardiovascular;  Laterality: N/A;   Septoplasty, nasal w/ submucosal resection  1960   SQUAMOUS CELL CARCINOMA EXCISION     pt said numerous times   TEE WITHOUT CARDIOVERSION N/A 03/22/2021   Procedure: TRANSESOPHAGEAL ECHOCARDIOGRAM (TEE);  Surgeon: Lelon Perla, MD;  Location: [facility] ENDOSCOPY;  Service: Cardiovascular;  Laterality: N/A;   TONSILLECTOMY     VIDEO ASSISTED THORACOSCOPY (VATS)/WEDGE RESECTION Right 11/09/2017   Procedure: RIGHT VIDEO ASSISTED THORACOSCOPY WITH Chales Salmon RESECTION;  Surgeon: [name], MD;  Location: [place];  Service: Thoracic;  Laterality: Right;   HPI:  Pt is an 80 y.o. male admitted 05/04/21 with generalized weakness, abdominal pain; code stroke called in ED due to confusion, difficulty word finding. Brain MRI negative for acute finding. CXR 8/10 with no acute findings. Workup for acute encephalopathy, symptomatic bradycardia, (+) COVID-19. PMH includes aflutter on eliquis (s/p DCCV 02/2021), bradycardia, HTN.   Assessment / Plan / Recommendation Clinical Impression  Pt presents with functional swallowing as assessed clincally. Pt tolerated all consistencies trialed with no clinical s/s of aspiration and exhibited good oral clearance of solids.  Pt has no further ST needs.  SLP will sign off at this time.  Recommend continuing regular texture diet with thin liquids. SLP Visit Diagnosis: Dysphagia, unspecified (R13.10)    Aspiration Risk  No limitations    Diet Recommendation Regular;Thin liquid   Liquid Administration via: Cup;Straw Medication Administration: Whole meds with liquid Supervision: Patient able to self feed Postural Changes: Seated upright at 90 degrees    Other  Recommendations Oral Care Recommendations: Oral care BID   Follow up Recommendations None      Frequency and Duration  (N/A)          Prognosis Prognosis for Safe Diet Advancement:  (N/A)      Swallow Study   General Date of Onset: 05/04/21 HPI: Pt is  an 80 y.o. male admitted 05/04/21 with generalized weakness, abdominal pain; code stroke called in ED due to confusion, difficulty word finding. Brain MRI negative for acute finding. CXR 8/10 with no acute findings. Workup for acute encephalopathy, symptomatic bradycardia, (+) COVID-19. PMH includes aflutter on eliquis (s/p DCCV 02/2021), bradycardia, HTN. Type of Study: Bedside Swallow Evaluation Previous Swallow Assessment: none Diet Prior to this Study: Regular;Thin liquids Temperature Spikes Noted: No Respiratory Status: Nasal cannula History of Recent Intubation: No Behavior/Cognition: Alert;Cooperative;Pleasant mood Oral Cavity Assessment: Within Functional Limits Oral Care Completed by SLP: No Oral Cavity - Dentition: Adequate natural dentition Vision: Functional for self-feeding Self-Feeding Abilities: Able to feed self Patient Positioning: Upright in bed Baseline Vocal Quality: Normal Volitional Swallow: Able to elicit    Oral/Motor/Sensory Function Overall Oral Motor/Sensory Function: Within functional limits Facial ROM: Within Functional Limits Facial Symmetry: Within Functional Limits Lingual ROM: Within Functional Limits Lingual Symmetry: Within Functional Limits Lingual Strength: Within Functional Limits Velum: Within Functional Limits Mandible: Within Functional Limits  Ice Chips Ice chips: Not tested   Thin Liquid Thin Liquid: Within functional limits Presentation: Cup;Straw    Nectar Thick Nectar Thick Liquid: Not tested   Honey Thick Honey Thick Liquid: Not tested   Puree Puree: Within functional limits Presentation: Spoon   Solid     Solid: Within functional limits Presentation: [place], [place], [place] Office: [phone]  05/06/2021,1:19 PM

## 2021-05-06 NOTE — Evaluation (Signed)
Occupational Therapy Evaluation Patient Details Name: Louis Barton MRN: [id number] DOB: 1941-02-22 Today's Date: 05/06/2021    History of Present Illness Pt is an 80 y.o. male admitted 05/04/21 with generalized weakness, abdominal pain; code stroke called in ED due to confusion, difficulty word finding. Brain MRI negative for acute finding. Workup for acute encephalopathy, symptomatic bradycardia, (+) COVID-19. PMH includes aflutter on eliquis (s/p DCCV 02/2021), bradycardia, HTN.   Clinical Impression   PTA patient independent and working. Admitted for above and limited by problem list below, including generalized weakness and decreased activity tolerance. Requires cueing for safety, problem solving during session, question recall and would benefit from further cognitive assessment.  Min guard for transfers and in room mobility, up to min guard for ADLs.  Would benefit from continued OT services acutely but anticipate he will progress well with no further needs after dc home.  Will follow.     Follow Up Recommendations  No OT follow up;Supervision - Intermittent    Equipment Recommendations  None recommended by OT    Recommendations for Other Services       Precautions / Restrictions Precautions Precautions: Fall Restrictions Weight Bearing Restrictions: No      Mobility Bed Mobility Overal bed mobility: Modified Independent             General bed mobility comments: HOB elevated    Transfers Overall transfer level: Needs assistance Equipment used: None Transfers: Sit to/from Stand Sit to Stand: Min guard         General transfer comment: min guard for safety/balance    Balance Overall balance assessment: Needs assistance   Sitting balance-Leahy Scale: Good     Standing balance support: No upper extremity supported;During functional activity Standing balance-Leahy Scale: Fair                             ADL either performed or assessed with  clinical judgement   ADL Overall ADL's : Needs assistance/impaired     Grooming: Min guard;Wash/dry face;Standing           Upper Body Dressing : Set up;Sitting   Lower Body Dressing: Min guard;Sit to/from stand   Toilet Transfer: Min guard;Ambulation Toilet Transfer Details (indicate cue type and reason): simulated to recliner         Functional mobility during ADLs: Min guard       Vision         Perception     Praxis      Pertinent Vitals/Pain Pain Assessment: No/denies pain     Hand Dominance Right   Extremity/Trunk Assessment Upper Extremity Assessment Upper Extremity Assessment: Overall WFL for tasks assessed   Lower Extremity Assessment Lower Extremity Assessment: Defer to PT evaluation       Communication Communication Communication: No difficulties   Cognition Arousal/Alertness: Awake/alert Behavior During Therapy: WFL for tasks assessed/performed Overall Cognitive Status: Impaired/Different from baseline Area of Impairment: Memory;Attention;Problem solving                   Current Attention Level: Sustained Memory: Decreased short-term memory       Problem Solving: Requires verbal cues General Comments: pt requires redirection to task, cueing for safety, and question recall as pt doesn't recall walking with PT this am.--continue assessment   General Comments  VSS on RA    Exercises     Shoulder Instructions      Home Living Family/patient expects to be discharged  to:: Private residence Living Arrangements: Spouse/significant other Available Help at Discharge: Family;Available 24 hours/day Type of Home: House Home Access: Stairs to enter     Home Layout: One level     Bathroom Shower/Tub: Teacher, early years/pre: Standard     Home Equipment: Cane - single point;Walker - 2 wheels;Shower seat;Walker - 4 wheels          Prior Functioning/Environment Level of Independence: Independent         Comments: Still working in [place]; drives. Denies recent falls. Frustrated by long-term effects of COVID ("we've tried every test known to man, and we've still got dizziness")        OT Problem List: Impaired balance (sitting and/or standing);Decreased strength;Decreased activity tolerance;Decreased safety awareness;Decreased knowledge of use of DME or AE;Decreased knowledge of precautions;Cardiopulmonary status limiting activity      OT Treatment/Interventions: Self-care/ADL training;Therapeutic exercise;DME and/or AE instruction;Therapeutic activities;Patient/family education;Energy conservation    OT Goals(Current goals can be found in the care plan section) Acute Rehab OT Goals Patient Stated Goal: Return home, back to work OT Goal Formulation: With patient Time For Goal Achievement: 05/20/21 Potential to Achieve Goals: Good  OT Frequency: Min 2X/week   Barriers to D/C:            Co-evaluation              AM-PAC OT "6 Clicks" Daily Activity     Outcome Measure Help from another person eating meals?: A Little Help from another person taking care of personal grooming?: A Little Help from another person toileting, which includes using toliet, bedpan, or urinal?: A Little Help from another person bathing (including washing, rinsing, drying)?: A Little Help from another person to put on and taking off regular upper body clothing?: A Little Help from another person to put on and taking off regular lower body clothing?: A Little 6 Click Score: 18   End of Session Nurse Communication: Mobility status  Activity Tolerance: Patient tolerated treatment well Patient left: in chair;with call bell/phone within reach;with nursing/sitter in room  OT Visit Diagnosis: Other abnormalities of gait and mobility (R26.89);Muscle weakness (generalized) (M62.81)                Time: 5883-2549 OT Time Calculation (min): 27 min Charges:  OT General Charges $OT Visit: 1  Visit OT Evaluation $OT Eval Moderate Complexity: 1 Mod OT Treatments $Self Care/Home Management : 8-22 mins  Jolaine Artist, OT Acute Rehabilitation Services Pager [phone] Office [phone]   Delight Stare 05/06/2021, 1:04 PM

## 2021-05-06 NOTE — Progress Notes (Signed)
Pt got discharged to home, discharge instructions provided and patient showed understanding to it, IV taken out,Telemonitor DC,pt left unit in wheelchair with all of the belongings accompanied with a family member (daughter)  Palma Holter, Therapist, sports

## 2021-05-06 NOTE — Discharge Summary (Signed)
Physician Discharge Summary  Louis Barton QPY:[id number] DOB: 1941-03-25 DOA: 05/04/2021  PCP: Raina Mina., MD  Admit date: 05/04/2021 Discharge date: 05/06/2021  Time spent: 40 minutes  Recommendations for Outpatient Follow-up:  Follow outpatient CBC/CMP Follow with cardiology outpatient  Follow up with EP outpatient for pacemaker evaluation Treating for covid with paxlovid - holding eliquis/crestor while he's taking these medicines and plans to resume 48 hrs after completion of paxlovid Quarantine per CDC guidelines CXR with mild interstitial edema.  Follow volume status, he's on lasix as needed at home (he's received 2 doses while admitted, instructed to take again tomorrow, then resume as prescribed) - follow outpatient with cardiology for adjustment of regimen Follow up CXR outpatient  Discharge Diagnoses:  Principal Problem:   Acute encephalopathy Active Problems:   Symptomatic bradycardia   Typical atrial flutter (HCC)   Heart block   COVID-19 virus infection   Sinus bradycardia   COVID   Discharge Condition: stable  Diet recommendation: heart healthy  Filed Weights   05/05/21 0115  Weight: 84.4 kg    History of present illness:  Louis Barton is Louis Barton 80 y.o. male with medical history significant of Louis Barton.Flutter with SVR in June s/p DCCV on eliquis, not on any nodal blocking agents due to h/o SVR and intermittent bradycardia; HTN, B CEAs.  He was admitted in the setting of acute metabolic encephalopathy, weakness, and concern for symptomatic bradycardia.  Code stroke called in ED for confusion and word finding.  MRI negative, but he was found to be positive for covid.  He was admitted for management of his symptoms and out of concern for symptomatic bradycardia.    Cardiology evaluated him and recommended outpatient EP evaluation.  He's been treated with paxlovid, plan for discharge on paxlovid for covid 19 infection.  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 CTA head/neck without LVO, bilateral carotid bifurcation atherosclerosis without greater than 50% stenosis, 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, 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  Suspect his weakness and encephalopathy were related to his covid infection. Discussed with pharmacy, cardiology.  Planning for paxlovid.  To simplify, will hold eliquis while he's on paxlovid (discussed risk of stroke with pt, wife, daughter - pt agreeable).  Hold eliquis/crestor for 7 days (48 hrs after paxlovid complete).  CXR with mild interstitial edema, maintaining sats with activity (see below)  No remdesivir due to black box warning about remdesivir possibly causing bradycardia  COVID-19 Labs  Recent Labs    05/05/21 0357 05/06/21 0604  DDIMER 0.33 0.32  CRP  6.2* 7.7*    Lab Results  Component Value Date   SARSCOV2NAA POSITIVE (Louis Barton) 05/04/2021   [place]  NEGATIVE 03/18/2021   [place] NEGATIVE 02/05/2021    # Mild Interstitial Edema  HFpEF  1.  Lasix prescribed prn prior to admission, he's received 2 doses while admitted, will have him take additional dose tomorrow, then resume as needed dosing  2.  Follow up with cardiology and PCP outpatient   3.  Follow up chest x ray outpatient  PAF - Currently going between sinus rhythm and 2nd degree HB with 2:1 conduction (no fib or flutter today). S/p DCCV in June Hold eliquis and use heparin gtt instead due to using Paxlovid for COVID-19 treatment Not on any nodal blocking agents due to h/o Louis Barton.Flutter with SVR.   HTN - Holding losartan  Procedures: none  Consultations: cardiology  Discharge Exam: Vitals:   05/06/21 1543 05/06/21 1600  BP: (!) 165/75 140/80  Pulse: 62 65  Resp: 20 20  Temp: 97.9 F (36.6 C)   SpO2: 93% 91%   Comfortable with possible discharge, though worried about exposing wife Comfortable with discharge plan Discussed with daughter and wife  General: No acute distress. Cardiovascular: RRR Lungs: no clear adventitious lung sounds with isolation scope Abdomen: Soft, nontender, nondistended  Neurological: Alert and oriented 3. Moves all extremities 4 . Cranial nerves II through XII grossly intact. Skin: Warm and dry. No rashes or lesions. Extremities: No clubbing or cyanosis. No edema.  Discharge Instructions   Discharge Instructions     Call MD for:  difficulty breathing, headache or visual disturbances   Complete by: As directed    Call MD for:  extreme fatigue   Complete by: As directed    Call MD for:  hives   Complete by: As directed    Call MD for:  persistant dizziness or light-headedness   Complete by: As directed    Call MD for:  persistant nausea and vomiting   Complete by: As directed    Call MD for:  redness, tenderness, or signs of infection (pain, swelling, redness, odor or green/yellow discharge around incision site)   Complete  by: As directed    Call MD for:  severe uncontrolled pain   Complete by: As directed    Call MD for:  temperature >100.4   Complete by: As directed    Diet - low sodium heart healthy   Complete by: As directed    Discharge instructions   Complete by: As directed    You were seen for COVID 19 infection, you've been started on paxlovid.  Do NOT take the eliquis or the crestor while you take paxlovid.  You can resume these medicines 2 days after you complete the paxlovid.    You should isolate for 10 days after your positive test.  Your isolation can discontinue after 8/19.    You had confusion, which I think was related to your covid infection.  You're doing well at this time.  No additional workup was recommended by neurology.  Cardiology is going to arrange outpatient follow up for Allie Gerhold pacemaker evaluation.  Please follow up outpatient with the EP doctors as planned.  Follow up with your cardiologist outpatient as planned.  Take your lasix and potassium tomorrow.  Then follow your weights daily and take as prescribed (which is as need for weight gain and or swelling).   Return for new, recurrent, or worsening symptoms.  Please ask your PCP to request records from this  hospitalization so they know what was done and what the next steps will be.   Increase activity slowly   Complete by: As directed       Allergies as of 05/06/2021   No Known Allergies      Medication List     TAKE these medications    acetaminophen 500 MG tablet Commonly known as: TYLENOL Take 1,000 mg by mouth every 6 (six) hours as needed for mild pain or moderate pain (for pain.).   apixaban 5 MG Tabs tablet Commonly known as: ELIQUIS Take 1 tablet (5 mg total) by mouth 2 (two) times daily. Start taking on: May 13, 2021 What changed: These instructions start on May 13, 2021. If you are unsure what to do until then, ask your doctor or other care provider.   colesevelam 625 MG tablet Commonly known  as: WELCHOL Take 1,250 mg by mouth 2 (two) times daily with Teresha Hanks meal.   fluticasone 50 MCG/ACT nasal spray Commonly known as: FLONASE Place 1 spray into both nostrils daily as needed for allergies.   FOLATE PO Take 1 tablet by mouth daily with lunch. Unknown strength   furosemide 40 MG tablet Commonly known as: LASIX Take 1 tablet (40 mg total) by mouth daily as needed for edema (for weight gain of 3lbs overnight, 5 lbs in 1 week).   losartan 100 MG tablet Commonly known as: COZAAR Take 1 tablet (100 mg total) by mouth daily.   nirmatrelvir/ritonavir EUA Tabs Commonly known as: PAXLOVID Take 3 tablets by mouth 2 (two) times daily for 5 days. Take nirmatrelvir (150 mg) two tablets twice daily for 5 days and ritonavir (100 mg) one tablet twice daily for 5 days.   potassium chloride 10 MEQ tablet Commonly known as: KLOR-CON Take 2 tablets (20 mEq total) by mouth daily as needed (take on days you take lasix).   rosuvastatin 40 MG tablet Commonly known as: CRESTOR Take 1 tablet (40 mg total) by mouth at bedtime. Start taking on: May 13, 2021 What changed: These instructions start on May 13, 2021. If you are unsure what to do until then, ask your doctor or other care provider.   SYSTANE ULTRA OP Place 1 drop into both eyes daily. Unknown strength   valACYclovir 1000 MG tablet Commonly known as: VALTREX Take 1,000 mg by mouth 2 (two) times daily as needed (for fever blisters.).   Vitamin D3 50 MCG (2000 UT) Tabs Take 2,000 Units by mouth daily.       No Known Allergies  Follow-up Information     Park Liter, MD Follow up on 05/12/2021.   Specialty: Cardiology Why: at 2 pm for your post hospital follow up with cardiology Contact information: [place] [address] [phone]                  The results of significant diagnostics from this hospitalization (including imaging, microbiology, ancillary and laboratory) are listed below for  reference.    Significant Diagnostic Studies: MR BRAIN WO CONTRAST  Result Date: 05/04/2021 CLINICAL DATA:  Initial evaluation for acute TIA. EXAM: MRI HEAD WITHOUT CONTRAST TECHNIQUE: Multiplanar, multiecho pulse sequences of the brain and surrounding structures were obtained without intravenous contrast. COMPARISON:  CTs from earlier the same day. Comparison also made with prior MRI from 05/13/2020. FINDINGS: Brain: Cerebral volume within normal limits for age. Scattered patchy T2/FLAIR hyperintensity involving the periventricular and deep white matter both cerebral hemispheres most consistent with chronic small vessel ischemic  disease, mild for age. Few small superimposed remote lacunar infarcts noted about the right caudate. Two tiny remote cortical infarcts at the high right frontal lobe noted as well, unchanged. No abnormal foci of restricted diffusion to suggest acute or subacute ischemia. Gray-white matter differentiation maintained. No encephalomalacia to suggest chronic cortical infarction. No acute intracranial hemorrhage. Chronic hemosiderin staining/siderosis seen along the cortical sulcus at the right frontal lobe, suggesting prior subarachnoid hemorrhage at this location, stable (series 13, image 39). No acute hemorrhage seen within this region on prior CT. No other foci of susceptibility artifact to suggest acute or chronic intracranial hemorrhage. No mass lesion, midline shift or mass effect. No hydrocephalus or extra-axial fluid collection. Pituitary gland suprasellar region normal. Midline structures intact. Vascular: Irregular flow void within the hypoplastic left V4 segment, consistent with previously identified occlusion. Major intracranial vascular flow voids are otherwise maintained. Skull and upper cervical spine: Craniocervical junction within normal limits. Bone marrow signal intensity normal. No scalp soft tissue abnormality. Sinuses/Orbits: Globes and orbital soft tissues demonstrate  no acute finding. Scattered mucosal thickening noted within the ethmoidal air cells and maxillary sinuses. Paranasal sinuses otherwise clear. Trace left mastoid effusion noted, of doubtful significance. Inner ear structures grossly normal. Other: None. IMPRESSION: 1. No acute intracranial abnormality. 2. Chronic hemosiderin staining along Ameyah Bangura cortical sulcus at the right frontal lobe, suggesting prior subarachnoid hemorrhage at this location. No acute hemorrhage seen within this region on prior CT. 3. Underlying mild chronic microvascular ischemic disease for age. Electronically Signed   By: Jeannine Boga M.D.   On: 05/04/2021 23:16   DG CHEST PORT 1 VIEW  Result Date: 05/06/2021 CLINICAL DATA:  Bradycardia. Chest and abdominal pain. Shortness of breath EXAM: PORTABLE CHEST 1 VIEW COMPARISON:  05/05/2021 FINDINGS: Midline trachea. Moderate cardiomegaly. Chronic pleuroparenchymal opacity in the inferior right hemithorax. No left-sided pleural effusion. No pneumothorax. Developing mild interstitial edema. Mild left base airspace disease. IMPRESSION: Cardiomegaly with developing mild interstitial edema. Chronic pleuroparenchymal opacification in the inferior right hemithorax. Suspect developing left base atelectasis. Electronically Signed   By: Abigail Miyamoto M.D.   On: 05/06/2021 10:35   DG CHEST PORT 1 VIEW  Result Date: 05/05/2021 CLINICAL DATA:  COVID-19 positivity EXAM: PORTABLE CHEST 1 VIEW COMPARISON:  03/18/2021 FINDINGS: Cardiac shadow is prominent but accentuated by the portable technique. Aortic calcifications are noted. Lungs are well aerated bilaterally and again demonstrate chronic scarring and pleural thickening in the right lung base stable from the prior exam. No new focal abnormality is seen. IMPRESSION: Chronic changes in the right base. No acute abnormality noted. Electronically Signed   By: Inez Catalina M.D.   On: 05/05/2021 01:27   CT HEAD CODE STROKE WO CONTRAST  Result Date:  05/04/2021 CLINICAL DATA:  Code stroke.  Neuro deficit, acute, stroke suspected EXAM: CT HEAD WITHOUT CONTRAST TECHNIQUE: Contiguous axial images were obtained from the base of the skull through the vertex without intravenous contrast. COMPARISON:  03/26/2021. FINDINGS: Brain: No evidence of acute large vascular territory infarction, hemorrhage, hydrocephalus, extra-axial collection or mass lesion/mass effect. Similar nonspecific mineralization in the right cerebellum. Similar generalized cerebral atrophy with ex vacuo ventricular dilation. Similar mild patchy white matter hypoattenuation, nonspecific but most likely related to chronic microvascular ischemic disease. Partially empty sella. Vascular: No hyperdense vessel identified. Skull: No acute fracture. Sinuses/Orbits: Mild paranasal sinus mucosal thickening. Small/atretic left maxillary sinus Other: No mastoid effusions. ASPECTS [facility] Stroke Program Early CT Score) Total score (0-10 with 10 being normal): 10. IMPRESSION: No evidence  of acute large vascular territory infarct or acute hemorrhage. ASPECTS is 10. Code stroke imaging results were communicated on 05/04/2021 at 6:44 pm to provider Dr. Rory Percy via secure text paging. Electronically Signed   By: Margaretha Sheffield MD   On: 05/04/2021 18:44   CT ANGIO HEAD NECK W WO CM (CODE STROKE)  Result Date: 05/04/2021 CLINICAL DATA:  Stroke follow-up. EXAM: CT ANGIOGRAPHY HEAD AND NECK TECHNIQUE: Multidetector CT imaging of the head and neck was performed using the standard protocol during bolus administration of intravenous contrast. Multiplanar CT image reconstructions and MIPs were obtained to evaluate the vascular anatomy. Carotid stenosis measurements (when applicable) are obtained utilizing NASCET criteria, using the distal internal carotid diameter as the denominator. CONTRAST:  70mL OMNIPAQUE IOHEXOL 350 MG/ML SOLN COMPARISON:  None. FINDINGS: CTA NECK FINDINGS Aortic arch: Great vessel origins are patent.  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 Right carotid system: Calcific and noncalcific tortuous atherosclerosis at the carotid bifurcation without greater than 50% stenosis. Tortuous ICA at the skull base. Left carotid system: Mild narrowing of the common carotid artery due to calcific atherosclerosis. Ulcerated calcific and noncalcific atherosclerosis at the carotid bifurcation without greater than 50% stenosis. Tortuous ICA at the skull base. Vertebral arteries: Mild stenosis of the right vertebral artery origin due to calcific atherosclerosis. Tortuous right vertebral artery without greater than 50% stenosis. Non dominant left vertebral artery is occluded at its origin with non opacification in neck. Faint reconstitution of the V3 and V4 vertebral artery, likely from collaterals. Skeleton: Moderate to severe multilevel degenerative disc disease, greatest at C3-C4, C4-C5 and C5-C6. Other neck: No acute findings. Upper chest: Visualized lung apices are clear. Review of the MIP images confirms the above findings CTA HEAD FINDINGS Anterior circulation: Bilateral intracranial ICAs, MCAs, and ACAs are patent without proximal flow limiting stenosis. No aneurysm identified. Posterior circulation: Small left intradural vertebral artery. Right intradural vertebral artery is patent without significant stenosis. The basilar artery and bilateral posterior cerebral arteries are patent without hemodynamically significant proximal stenosis. Fetal type left PCA. Prominent right posterior communicating artery with small right P1 PCA. Small basilar artery. Venous sinuses: As permitted by contrast timing, patent. Anatomic variants: Detailed above. Review of the MIP images confirms the above findings IMPRESSION: CTA Head: No large vessel occlusion or proximal hemodynamically significant stenosis. CTA neck: 1. 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 2. Bilateral  carotid bifurcation atherosclerosis without greater than 50% stenosis. 3. 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 Electronically Signed   By: Margaretha Sheffield MD   On: 05/04/2021 19:13    Microbiology: Recent Results (from the past 240 hour(s))  Resp Panel by RT-PCR (Flu Alyvia Derk&B, Covid) Nasopharyngeal Swab     Status: Abnormal   Collection Time: 05/04/21  7:34 PM   Specimen: Nasopharyngeal Swab; Nasopharyngeal(NP) swabs in vial transport medium  Result Value Ref Range Status   SARS Coronavirus 2 by RT PCR POSITIVE (Shawnette Augello) NEGATIVE Final    Comment: RESULT CALLED TO, READ BACK BY AND VERIFIED WITH: C NUCKLES RN [id number] 05/04/21 Chayim Bialas BROWNING (NOTE) SARS-CoV-2 target nucleic acids are DETECTED.  The SARS-CoV-2 RNA is generally detectable in upper respiratory specimens during the acute phase of infection. Positive results are indicative of the presence of the identified virus, but do not rule out bacterial infection or co-infection with other pathogens not detected by the test. Clinical correlation with patient history and other diagnostic information is necessary to determine  patient infection status. The expected result is Negative.  Fact Sheet for Patients: EntrepreneurPulse.com.au  Fact Sheet for Healthcare Providers: IncredibleEmployment.be  This test is not yet approved or cleared by the [place] FDA and  has been authorized for detection and/or diagnosis of SARS-CoV-2 by FDA under an Emergency Use Authorization (EUA).  This EUA will remain in effect (meaning this test can  be used) for the duration of  the COVID-19 declaration under Section 564(b)(1) of the Act, 21 U.S.C. section 360bbb-3(b)(1), unless the authorization is terminated or revoked sooner.     Influenza [place] by PCR NEGATIVE NEGATIVE Final   Influenza B by PCR NEGATIVE NEGATIVE Final    Comment: (NOTE) The Xpert Xpress SARS-CoV-2/FLU/RSV plus assay is intended as an aid in  the diagnosis of influenza from Nasopharyngeal swab specimens and should not be used as Rozalyn Osland sole basis for treatment. Nasal washings and aspirates are unacceptable for Xpert Xpress SARS-CoV-2/FLU/RSV testing.  Fact Sheet for Patients: EntrepreneurPulse.com.au  Fact Sheet for Healthcare Providers: IncredibleEmployment.be  This test is not yet approved or cleared by the [place] FDA and has been authorized for detection and/or diagnosis of SARS-CoV-2 by FDA under an Emergency Use Authorization (EUA). This EUA will remain in effect (meaning this test can be used) for the duration of the COVID-19 declaration under Section 564(b)(1) of the Act, 21 U.S.C. section 360bbb-3(b)(1), unless the authorization is terminated or revoked.  Performed at [facility], [place] [address], [place], [place] [address]      Labs: Basic Metabolic Panel: Recent Labs  Lab 05/04/21 1821 05/04/21 1828 05/05/21 0357 05/06/21 0604  NA 137 139 138 139  K 3.9 3.9 4.2 3.7  CL 105 104 104 105  CO2 26  --  25 24  GLUCOSE 108* 106* 99 86  BUN 13 13 11 11   CREATININE 0.99 0.90 1.04 0.82  CALCIUM 8.5*  --  8.5* 8.5*  MG  --   --  1.7  --    Liver Function Tests: Recent Labs  Lab 05/04/21 1821 05/05/21 0357 05/06/21 0604  AST 21 19 22   ALT 13 13 14   ALKPHOS 40 34* 32*  BILITOT 0.8 0.5 0.9  PROT 5.8* 5.6* 5.7*  ALBUMIN 3.2* 3.0* 2.9*   Recent Labs  Lab 05/04/21 1856  LIPASE 23   No results for input(s): AMMONIA in the last 168 hours. CBC: Recent Labs  Lab 05/04/21 1821 05/04/21 1828 05/05/21 0357 05/06/21 0604  WBC 8.8  --  7.4 6.7  NEUTROABS 7.0  --  4.8 3.8  HGB 14.7 15.3 14.3 14.2  HCT 44.5 45.0 43.1 42.8  MCV 92.9  --  93.1 92.0  PLT 122*  --  107* 99*   Cardiac Enzymes: No results for input(s): CKTOTAL, CKMB, CKMBINDEX, TROPONINI in the last 168 hours. BNP: BNP (last 3 results) Recent Labs    03/18/21 1816  BNP 187.1*    ProBNP  (last 3 results) No results for input(s): PROBNP in the last 8760 hours.  CBG: Recent Labs  Lab 05/04/21 1821  GLUCAP 109*       Signed:  Fayrene Helper MD.  [facility] 05/06/2021, 6:51 PM

## 2021-05-06 NOTE — Progress Notes (Addendum)
Notified Pharmacy and Kayleen Memos, DO. pt refused Paxlovid. Pt was educated last night on importance of medication for COVID. Pt stated " I don't have COVID" Pt ask last night if we could test his sputum to see what is wrong with him because he doesn't have COVID but he thinks he has something else. This morning pt stated he wants to take whatever drug is prescribed for COVID. Pt informed the drug is Paxlovid and the next dose is due this morning and we will make sure he receives it.

## 2021-05-06 NOTE — Plan of Care (Signed)
Message sent to EP office coordinator for arranging PPM evaluation appointment for the patient.

## 2021-05-11 ENCOUNTER — Telehealth: Payer: Self-pay | Admitting: Internal Medicine

## 2021-05-11 NOTE — Telephone Encounter (Signed)
Paged by Louis Barton re BP reading of 220/90. Called Louis Barton. He is completely asymptomatic. He has checked his BP multiple times in both arms with readings generally in the 160s-180s. I asked him to repeat his BP in 1 hour. If his systolic is over 262 he will take this AM losartan this evening. If again over 200 he will give me a call again. I advised him to come to the ED with any change in symptoms. He will f/u with his cardiologist tomorrow.

## 2021-05-12 ENCOUNTER — Ambulatory Visit (INDEPENDENT_AMBULATORY_CARE_PROVIDER_SITE_OTHER): Payer: Medicare Other | Admitting: Cardiology

## 2021-05-12 ENCOUNTER — Encounter: Payer: Self-pay | Admitting: Cardiology

## 2021-05-12 ENCOUNTER — Other Ambulatory Visit: Payer: Self-pay

## 2021-05-12 VITALS — BP 160/90 | HR 85 | Ht 68.0 in | Wt 181.0 lb

## 2021-05-12 DIAGNOSIS — I459 Conduction disorder, unspecified: Secondary | ICD-10-CM | POA: Diagnosis not present

## 2021-05-12 DIAGNOSIS — E7800 Pure hypercholesterolemia, unspecified: Secondary | ICD-10-CM | POA: Diagnosis not present

## 2021-05-12 DIAGNOSIS — I5032 Chronic diastolic (congestive) heart failure: Secondary | ICD-10-CM | POA: Diagnosis not present

## 2021-05-12 DIAGNOSIS — C3492 Malignant neoplasm of unspecified part of left bronchus or lung: Secondary | ICD-10-CM

## 2021-05-12 MED ORDER — FUROSEMIDE 40 MG PO TABS: 40.0000 mg | ORAL_TABLET | Freq: Every day | ORAL | 3 refills | Status: DC

## 2021-05-12 MED ORDER — AMLODIPINE BESYLATE 2.5 MG PO TABS: 2.5000 mg | ORAL_TABLET | Freq: Every day | ORAL | 3 refills | Status: DC

## 2021-05-12 MED ORDER — POTASSIUM CHLORIDE CRYS ER 20 MEQ PO TBCR: 20.0000 meq | EXTENDED_RELEASE_TABLET | Freq: Every day | ORAL | 3 refills | Status: DC

## 2021-05-12 NOTE — Patient Instructions (Addendum)
Medication Instructions:  Your physician has recommended you make the following change in your medication:  START: Furosemide 40 mg take one tablet by mouth daily.  START: Potassium 20 meq take one tablet by mouth daily.  START: Amlodipine 2.5 mg take one tablet by mouth daily.  *If you need a refill on your cardiac medications before your next appointment, please call your pharmacy*   Lab Work: Your physician recommends that you return for lab work in: 1 week BMP If you have labs (blood work) drawn today and your tests are completely normal, you will receive your results only by: [place] (if you have MyChart) OR A paper copy in the mail If you have any lab test that is abnormal or we need to change your treatment, we will call you to review the results.   Testing/Procedures: None   Follow-Up: At [facility], you and your health needs are our priority.  As part of our continuing mission to provide you with exceptional heart care, we have created designated Provider Care Teams.  These Care Teams include your primary Cardiologist (physician) and Advanced Practice Providers (APPs -  Physician Assistants and Nurse Practitioners) who all work together to provide you with the care you need, when you need it.  We recommend signing up for the patient portal called "MyChart".  Sign up information is provided on this After Visit Summary.  MyChart is used to connect with patients for Virtual Visits (Telemedicine).  Patients are able to view lab/test results, encounter notes, upcoming appointments, etc.  Non-urgent messages can be sent to your provider as well.   To learn more about what you can do with MyChart, go to [email or web address].    Your next appointment:   2 month(s)  The format for your next appointment:   In Person  Provider:   Jenne Campus, MD   Other Instructions

## 2021-05-12 NOTE — Progress Notes (Signed)
Cardiology Office Note:    Date:  05/12/2021   ID:  Louis Barton, DOB 08/08/1941, MRN [id number]  PCP:  Raina Mina., MD  Cardiologist:  Jenne Campus, MD    Referring MD: Raina Mina., MD   Chief Complaint  Patient presents with   covid 05/04/2021    C/o fatigue, bilateral feet swelling and pain\.     History of Present Illness:    Louis Barton is a 80 y.o. male   with quite complex past medical history.  That include coronary artery disease, mild nonobstructive, cardiac catheterization done in May 2022 showed 20% mid and distal circumflex as well as 20% of mid LAD lesion.  Also history of essential hypertension first-degree AV block, status post bilateral carotic endarterectomy, non-small cell lung cancer status post wedge resection and right lower lobe, remote history of TIA.  Recently he ended up being in [facility] because of episode of atrial flutter with slow ventricular rate.  He did have TEE done and then eventually electrical cardioversion.  Also a week ago he ended up going to the hospital because of confusion there was some issue about potentially having CVA/TIA, however he was find to be positive for COVID.  He was given antiviral medication however took only for 3 days.  He comes today to my office for follow-up.  Also when he was in the hospital he was noted to be bradycardic with 2 1 AV conduction.  He comes today to my office with his daughter.  He said he still complain of being weak tired exhausted.  Does have chronic swelling of lower extremities.  He did stop his antiviral medication went back on anticoagulation.  Denies have any chest pain tightness squeezing pressure burning chest.  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 signficant DDD present with vacuum effect L4-5 and L5-S1 flat discs and anterior spurring noted   Dizziness 05/06/2020   Dyslipidemia 03/18/2015   Dyspnea on exertion 04/02/2020   Ear itch 05/14/2018   Edema 11/16/2015   Edema of both lower extremities due to peripheral venous insufficiency 02/03/2020   First degree AV block 10/20/2020   History of hiatal hernia    seen on last scan- as slight    History of thrombocytopenia    History of TIAs    Hyperlipidemia    Hypertension    Irritable bowel syndrome 11/17/2016   rec trial of citrucel/ diet 11/16/2016 >>>      Local edema 02/07/2018   Malaise and fatigue 12/08/2020   Malignant neoplasm of  left lung ([place]) 12/08/2017   Stage 1A. [place]. And Dr. 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 Renal parapelvic cysts 10/06/16 on CT , right kidney midpole 1.2 cm, right kidney anterior pole 1.1 cm, and to the left kidney lower pole anterior position 3.5cm all felt to be benign   Right bundle branch block 10/20/2020   Right lower lobe pulmonary nodule 11/09/2017   Shingles     internal- obstruction of bowels & gallbladder   Solitary pulmonary nodule 11/16/2016   CT ABd 03/24/09 linerar densities in RLL and RML c/w scarring o/w clear CT Abd 10/20/16 c/w 7 mm nodule  - CT chest 02/13/2017 :  slt enlarged to 9 mm   - PET 04/04/17 :1. These slowly enlarging 9 mm right lower lobe pulmonary nodule along the right hemidiaphragm does not appear hypermetabolic today. Location adjacent to the hemidiaphragm can cause false negatives due to motion artifact related to the   Stage 3a chronic kidney disease ([place]) 06/17/2020   Stroke ([place])    Swelling of limb-Left foot 01/01/2015   Vitamin B12 deficiency 06/21/2017    Past Surgical History:  Procedure Laterality Date   adenocarcinoma  2019   Removed   CARDIOVERSION N/A 03/22/2021   Procedure: CARDIOVERSION;  Surgeon: Lelon Perla, MD;  Location: [facility];  Service: Cardiovascular;  Laterality: N/A;   CAROTID ENDARTERECTOMY  2005   Left carotid by Dr. Oneida Alar   CAROTID ENDARTERECTOMY  04/25/11   Right Carotid by Dr. Oneida Alar   COLONOSCOPY  02/23/2017   Colonic polyp status post polypectomy. Pancolonic diverticulosis predominatly in the sigmoid colon. Tubular adenoma   LEFT HEART CATH AND CORONARY ANGIOGRAPHY N/A 02/08/2021   Procedure: LEFT HEART CATH AND CORONARY ANGIOGRAPHY;  Surgeon: Burnell Blanks, MD;  Location: [facility];  Service: Cardiovascular;  Laterality: N/A;   Septoplasty, nasal w/ submucosal resection  1960   SQUAMOUS CELL CARCINOMA EXCISION     pt said numerous times   TEE WITHOUT CARDIOVERSION N/A 03/22/2021   Procedure: TRANSESOPHAGEAL ECHOCARDIOGRAM (TEE);  Surgeon: Lelon Perla, MD;  Location: [facility];  Service: Cardiovascular;  Laterality: N/A;   TONSILLECTOMY     VIDEO ASSISTED THORACOSCOPY (VATS)/WEDGE RESECTION Right 11/09/2017   Procedure: RIGHT VIDEO ASSISTED THORACOSCOPY WITH Chales Salmon RESECTION;  Surgeon: [name], MD;  Location: [facility];  Service: Thoracic;   Laterality: Right;    Current Medications: Current Meds  Medication Sig   acetaminophen (TYLENOL) 500 MG tablet Take 1,000 mg by mouth every 6 (six) hours as needed for mild pain or moderate pain (for pain.).   [START ON 05/13/2021] apixaban (ELIQUIS) 5 MG TABS tablet Take 1 tablet (5 mg total) by mouth 2 (two) times daily.   Cholecalciferol (VITAMIN D3) 50 MCG (2000 UT) TABS Take 2,000 Units by mouth daily.   colesevelam (WELCHOL) 625 MG tablet Take 1,250 mg by mouth 2 (two) times daily with a meal.   fluticasone (FLONASE) 50 MCG/ACT nasal spray Place 1 spray into both nostrils daily as needed for allergies.   Folic Acid (FOLATE PO) Take 1 tablet by mouth daily with lunch. Unknown strength   furosemide (LASIX) 40 MG tablet Take 1 tablet (40 mg total) by  mouth daily as needed for edema (for weight gain of 3lbs overnight, 5 lbs in 1 week).   losartan (COZAAR) 100 MG tablet Take 1 tablet (100 mg total) by mouth daily.   Polyethyl Glycol-Propyl Glycol (SYSTANE ULTRA OP) Place 1 drop into both eyes daily. Unknown strength   potassium chloride (KLOR-CON) 10 MEQ tablet Take 2 tablets (20 mEq total) by mouth daily as needed (take on days you take lasix).   [START ON 05/13/2021] rosuvastatin (CRESTOR) 40 MG tablet Take 1 tablet (40 mg total) by mouth at bedtime.   valACYclovir (VALTREX) 1000 MG tablet Take 1,000 mg by mouth 2 (two) times daily as needed (for fever blisters.).      Allergies:   Patient has no known allergies.   Social History   Socioeconomic History   Marital status: Married    Spouse name: Not on file   Number of children: 2   Years of education: Not on file   Highest education level: Not on file  Occupational History   Not on file  Tobacco Use   Smoking status: Former    Packs/day: 1.00    Years: 16.00    Pack years: 16.00    Types: Cigarettes    Quit date: 09/26/1972    Years since quitting: 48.6   Smokeless tobacco: Never  Vaping Use   Vaping Use: Never used   Substance and Sexual Activity   Alcohol use: Not Currently    Alcohol/week: 2.0 standard drinks    Types: 2 Glasses of wine per week   Drug use: No   Sexual activity: Not on file  Other Topics Concern   Not on file  Social History Narrative   Not on file   Social Determinants of Health   Financial Resource Strain: Not on file  Food Insecurity: Not on file  Transportation Needs: Not on file  Physical Activity: Not on file  Stress: Not on file  Social Connections: Not on file     Family History: The patient's family history includes Cancer in his brother and sister; Cancer (age of onset: 96) in his mother; Deep vein thrombosis in his mother; Diabetes in his father and mother; Heart attack in his father and mother; Heart disease in his mother; Hyperlipidemia in his mother; Hypertension in his mother; Other in his mother and sister; Stroke in his mother; Varicose Veins in his mother. There is no history of Colon cancer or Esophageal cancer. ROS:   Please see the history of present illness.    All 14 point review of systems negative except as described per history of present illness  EKGs/Labs/Other Studies Reviewed:      Recent Labs: 03/18/2021: B Natriuretic Peptide 187.1 05/05/2021: Magnesium 1.7; TSH 0.946 05/06/2021: ALT 14; BUN 11; Creatinine, Ser 0.82; Hemoglobin 14.2; Platelets 99; Potassium 3.7; Sodium 139  Recent Lipid Panel    Component Value Date/Time   CHOL 159 11/21/2018 0836   TRIG 82 11/21/2018 0836   HDL 59 11/21/2018 0836   CHOLHDL 2.7 11/21/2018 0836   LDLCALC 84 11/21/2018 0836    Physical Exam:    VS:  BP (!) 160/90 (BP Location: Left Arm, Patient Position: Sitting)   Pulse 85   Ht 5\' 8"  (1.727 m)   Wt 181 lb (82.1 kg)   SpO2 92%   BMI 27.52 kg/m     Wt Readings from Last 3 Encounters:  05/12/21 181 lb (82.1 kg)  05/05/21 186 lb (84.4 kg)  03/26/21 186 lb (84.4 kg)  GEN:  Well nourished, well developed in no acute distress HEENT:  Normal NECK: No JVD; No carotid bruits LYMPHATICS: No lymphadenopathy CARDIAC: RRR, no murmurs, no rubs, no gallops RESPIRATORY:  Clear to auscultation without rales, wheezing or rhonchi  ABDOMEN: Soft, non-tender, non-distended MUSCULOSKELETAL: 1+ pitting edema bilaterally; No deformity  SKIN: Warm and dry LOWER EXTREMITIES: no swelling NEUROLOGIC:  Alert and oriented x 3 PSYCHIATRIC:  Normal affect   ASSESSMENT:    1. Chronic heart failure with preserved ejection fraction ([place])   2. Heart block   3. Malignant neoplasm of left lung, unspecified part of lung ([place])   4. Pure hypercholesterolemia    PLAN:    In order of problems listed above:  Chronic congestive heart failure he does have swelling of lower extremities asking to go back on furosemide 40 and potassium 20. Essential hypertension blood pressure still elevated.  Will restart diuretic as well as I will add small dose of amlodipine only 2.5 mg of course concern is about swelling of lower extremities. Second-degree AV block previously noted during the night but now become more concerning.  He is cleared to see EP team for consideration of pacemaker implantation. Dyslipidemia we will continue present management. I did review record from [facility] for this visit   Medication Adjustments/Labs and Tests Ordered: Current medicines are reviewed at length with the patient today.  Concerns regarding medicines are outlined above.  No orders of the defined types were placed in this encounter.  Medication changes: No orders of the defined types were placed in this encounter.   Signed, Park Liter, MD, [facility] 05/12/2021 2:49 PM    [place]

## 2021-05-17 ENCOUNTER — Other Ambulatory Visit: Payer: Self-pay

## 2021-05-17 ENCOUNTER — Ambulatory Visit (INDEPENDENT_AMBULATORY_CARE_PROVIDER_SITE_OTHER): Payer: Medicare Other | Admitting: Cardiology

## 2021-05-17 ENCOUNTER — Encounter: Payer: Self-pay | Admitting: Cardiology

## 2021-05-17 VITALS — BP 132/78 | HR 82 | Ht 68.0 in | Wt 175.2 lb

## 2021-05-17 DIAGNOSIS — Z01818 Encounter for other preprocedural examination: Secondary | ICD-10-CM | POA: Diagnosis not present

## 2021-05-17 DIAGNOSIS — I459 Conduction disorder, unspecified: Secondary | ICD-10-CM

## 2021-05-17 DIAGNOSIS — Z01812 Encounter for preprocedural laboratory examination: Secondary | ICD-10-CM

## 2021-05-17 LAB — CBC
Hematocrit: 46.4 % (ref 37.5–51.0)
Hemoglobin: 15.8 g/dL (ref 13.0–17.7)
MCH: 30.7 pg (ref 26.6–33.0)
MCHC: 34.1 g/dL (ref 31.5–35.7)
MCV: 90 fL (ref 79–97)
Platelets: 206 10*3/uL (ref 150–450)
RBC: 5.15 x10E6/uL (ref 4.14–5.80)
RDW: 14.3 % (ref 11.6–15.4)
WBC: 7.2 10*3/uL (ref 3.4–10.8)

## 2021-05-17 LAB — BASIC METABOLIC PANEL
BUN/Creatinine Ratio: 17 (ref 10–24)
BUN: 15 mg/dL (ref 8–27)
CO2: 27 mmol/L (ref 20–29)
Calcium: 9.2 mg/dL (ref 8.6–10.2)
Chloride: 105 mmol/L (ref 96–106)
Creatinine, Ser: 0.86 mg/dL (ref 0.76–1.27)
Glucose: 91 mg/dL (ref 65–99)
Potassium: 4.3 mmol/L (ref 3.5–5.2)
Sodium: 138 mmol/L (ref 134–144)
eGFR: 86 mL/min/{1.73_m2} (ref >59)

## 2021-05-17 NOTE — Addendum Note (Signed)
Addended by: Marciano Sequin on: 05/17/2021 04:12 PM   Modules accepted: Orders

## 2021-05-17 NOTE — Progress Notes (Signed)
Electrophysiology Office Note   Date:  05/17/2021   ID:  Louis Barton 16-Apr-1941, MRN [id number]  PCP:  Louis Mina., Barton  Cardiologist: Louis Barton Primary Electrophysiologist:  Louis Barton    Chief Complaint: Fatigue   History of Present Illness: Louis Barton is a 80 y.o. male who is being seen today for the evaluation of second-degree heart block at the request of Louis Mina., Barton. Presenting today for electrophysiology evaluation.  He has a history significant for nonobstructive coronary artery disease, hypertension, bilateral carotid endarterectomy, non-small cell lung cancer status post wedge resection, TIA.  He went to [facility] with atrial flutter and slow ventricular rate.    He presented to the hospital in early August 2020 with confusion.  He was noted to be COVID-positive.  He was bradycardic in 2-1 conduction.  Today, he denies symptoms of palpitations, chest pain, shortness of breath, orthopnea, PND, lower extremity edema, claudication, dizziness, presyncope, syncope, bleeding, or neurologic sequela. The patient is tolerating medications without difficulties.  Today he feels well.  He has normal conduction today.  When he has secondary heart block with heart rates in the 30s, and feels sluggish fatigue.   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 signficant DDD present with vacuum effect L4-5 and L5-S1 flat discs and anterior spurring noted   Dizziness 05/06/2020   Dyslipidemia 03/18/2015   Dyspnea on exertion 04/02/2020   Ear itch 05/14/2018   Edema 11/16/2015   Edema of both lower extremities due to peripheral venous insufficiency 02/03/2020   First degree AV block 10/20/2020   History of hiatal hernia    seen on last scan- as slight    History of thrombocytopenia    History of TIAs    Hyperlipidemia    Hypertension    Irritable bowel syndrome 11/17/2016   rec trial of citrucel/ diet 11/16/2016 >>>      Local edema 02/07/2018   Malaise and fatigue 12/08/2020   Malignant neoplasm of left lung (Louis Barton) 12/08/2017   Stage 1A. [place]. And Dr. 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 Renal parapelvic cysts 10/06/16 on CT , right kidney midpole 1.2 cm, right kidney anterior pole 1.1 cm, and to the left kidney lower pole  anterior position 3.5cm all felt to be benign   Right bundle branch block 10/20/2020   Right lower lobe pulmonary nodule 11/09/2017   Shingles    internal- obstruction of bowels & gallbladder   Solitary pulmonary nodule 11/16/2016   CT ABd 03/24/09 linerar densities in RLL and RML c/w scarring o/w clear CT Abd 10/20/16 c/w 7 mm nodule  - CT chest 02/13/2017 :  slt enlarged to 9 mm   - PET 04/04/17 :1. These slowly enlarging 9 mm right lower lobe pulmonary nodule along the right hemidiaphragm does not appear  hypermetabolic today. Location adjacent to the hemidiaphragm can cause false negatives due to motion artifact related to the   Stage 3a chronic kidney disease (Louis Barton) 06/17/2020   Stroke (Louis Barton)    Swelling of limb-Left foot 01/01/2015   Vitamin B12 deficiency 06/21/2017   Past Surgical History:  Procedure Laterality Date   adenocarcinoma  2019   Removed   CARDIOVERSION N/A 03/22/2021   Procedure: CARDIOVERSION;  Surgeon: Lelon Perla, Barton;  Location: [place];  Service: Cardiovascular;  Laterality: N/A;   CAROTID ENDARTERECTOMY  2005   Left carotid by Dr. Oneida Alar   CAROTID ENDARTERECTOMY  04/25/11   Right Carotid by Dr. Oneida Alar   COLONOSCOPY  02/23/2017   Colonic polyp status post polypectomy. Pancolonic diverticulosis predominatly in the sigmoid colon. Tubular adenoma   LEFT HEART CATH AND CORONARY ANGIOGRAPHY N/A 02/08/2021   Procedure: LEFT HEART CATH AND CORONARY ANGIOGRAPHY;  Surgeon: Burnell Blanks, Barton;  Location: [facility];  Service: Cardiovascular;  Laterality: N/A;   Septoplasty, nasal w/ submucosal resection  1960   SQUAMOUS CELL CARCINOMA EXCISION     pt said numerous times   TEE WITHOUT CARDIOVERSION N/A 03/22/2021   Procedure: TRANSESOPHAGEAL ECHOCARDIOGRAM (TEE);  Surgeon: Lelon Perla, Barton;  Location: [place];  Service: Cardiovascular;  Laterality: N/A;   TONSILLECTOMY     VIDEO ASSISTED THORACOSCOPY (VATS)/WEDGE RESECTION Right 11/09/2017   Procedure: RIGHT VIDEO ASSISTED THORACOSCOPY WITH Chales Salmon RESECTION;  Surgeon: [name], Barton;  Location: [place];  Service: Thoracic;  Laterality: Right;     Current Outpatient Medications  Medication Sig Dispense Refill   acetaminophen (TYLENOL) 500 MG tablet Take 1,000 mg by mouth every 6 (six) hours as needed for mild pain or moderate pain (for pain.).     amLODipine (NORVASC) 2.5 MG tablet Take 1 tablet (2.5 mg total) by mouth daily. 180 tablet 3   apixaban (ELIQUIS) 5 MG TABS tablet Take 1  tablet (5 mg total) by mouth 2 (two) times daily. 180 tablet 3   Cholecalciferol (VITAMIN D3) 50 MCG (2000 UT) TABS Take 2,000 Units by mouth daily.     colesevelam (WELCHOL) 625 MG tablet Take 1,250 mg by mouth 2 (two) times daily with a meal.     fluticasone (FLONASE) 50 MCG/ACT nasal spray Place 1 spray into both nostrils daily as needed for allergies.     Folic Acid (FOLATE PO) Take 1 tablet by mouth daily with lunch. Unknown strength     furosemide (LASIX) 40 MG tablet Take 1 tablet (40 mg total) by mouth daily. 90 tablet 3   losartan (COZAAR) 100 MG tablet Take 1 tablet (100 mg total) by mouth daily. 90 tablet 1   Polyethyl Glycol-Propyl Glycol (SYSTANE ULTRA OP) Place 1 drop into both eyes daily. Unknown strength     potassium chloride (KLOR-CON) 10 MEQ tablet Take 2 tablets (20 mEq total) by mouth daily as needed (take  on days you take lasix). 180 tablet 3   potassium chloride SA (KLOR-CON) 20 MEQ tablet Take 1 tablet (20 mEq total) by mouth daily. 90 tablet 3   rosuvastatin (CRESTOR) 40 MG tablet Take 1 tablet (40 mg total) by mouth at bedtime.     valACYclovir (VALTREX) 1000 MG tablet Take 1,000 mg by mouth 2 (two) times daily as needed (for fever blisters.).      No current facility-administered medications for this visit.    Allergies:   Patient has no known allergies.   Social History:  The patient  reports that he quit smoking about 48 years ago. His smoking use included cigarettes. He has a 16.00 pack-year smoking history. He has never used smokeless tobacco. He reports that he does not currently use alcohol after a past usage of about 2.0 standard drinks per week. He reports that he does not use drugs.   Family History:  The patient's family history includes Cancer in his brother and sister; Cancer (age of onset: 86) in his mother; Deep vein thrombosis in his mother; Diabetes in his father and mother; Heart attack in his father and mother; Heart disease in his mother;  Hyperlipidemia in his mother; Hypertension in his mother; Other in his mother and sister; Stroke in his mother; Varicose Veins in his mother.    ROS:  Please see the history of present illness.   Otherwise, review of systems is positive for none.   All other systems are reviewed and negative.    PHYSICAL EXAM: VS:  BP 132/78   Pulse 82   Ht 5\' 8"  (1.727 m)   Wt 175 lb 3.2 oz (79.5 kg)   SpO2 99%   BMI 26.64 kg/m  , BMI Body mass index is 26.64 kg/m. GEN: Well nourished, well developed, in no acute distress  HEENT: normal  Neck: no JVD, carotid bruits, or masses Cardiac: RRR; no murmurs, rubs, or gallops,no edema  Respiratory:  clear to auscultation bilaterally, normal work of breathing GI: soft, nontender, nondistended, + BS MS: no deformity or atrophy  Skin: warm and dry Neuro:  Strength and sensation are intact Psych: euthymic mood, full affect  EKG:  EKG is ordered today. Personal review of the ekg ordered shows sinus rhythm, rate 82  Recent Labs: 03/18/2021: B Natriuretic Peptide 187.1 05/05/2021: Magnesium 1.7; TSH 0.946 05/06/2021: ALT 14; BUN 11; Creatinine, Ser 0.82; Hemoglobin 14.2; Platelets 99; Potassium 3.7; Sodium 139    Lipid Panel     Component Value Date/Time   CHOL 159 11/21/2018 0836   TRIG 82 11/21/2018 0836   HDL 59 11/21/2018 0836   CHOLHDL 2.7 11/21/2018 0836   LDLCALC 84 11/21/2018 0836     Wt Readings from Last 3 Encounters:  05/17/21 175 lb 3.2 oz (79.5 kg)  05/12/21 181 lb (82.1 kg)  05/05/21 186 lb (84.4 kg)      Other studies Reviewed: Additional studies/ records that were reviewed today include: TTE 03/19/21  Review of the above records today demonstrates:   1. Left ventricular ejection fraction, by estimation, is 60 to 65%. The  left ventricle has normal function. The left ventricle has no regional  wall motion abnormalities. There is mild left ventricular hypertrophy.  Left ventricular diastolic parameters  are indeterminate.    2. Right ventricular systolic function is normal. The right ventricular  size is normal.   3. The mitral valve is normal in structure. Mild mitral valve  regurgitation. No evidence of mitral stenosis.  4. The aortic valve is calcified. Aortic valve regurgitation is trivial.  Mild to moderate aortic valve sclerosis/calcification is present, without  any evidence of aortic stenosis.   5. The inferior vena cava is normal in size with greater than 50%  respiratory variability, suggesting right atrial pressure of 3 mmHg.    ASSESSMENT AND PLAN:  1.  Second-degree AV block: Has had episodes of 2-1 AV block intermittently.  He has weakness and fatigue when he has these episodes.  He is on no rate controlling medications.  Due to that, we Stanly Si plan for pacemaker implant.  Risks and benefits of been discussed include bleeding, tamponade, infection, pneumothorax.  He understand these risks and agreed to the procedure.  We Rain Friedt have him hold Eliquis for the 2 days prior to implant.  2.  Chronic diastolic heart failure: No obvious volume overload  3.  Typical atrial flutter: Currently on Eliquis.  Had slow ventricular rate and has had 2-1 AV block and thus not on rate controlling medications.  CHA2DS2-VASc of at least 5.  Case discussed with primary cardiology  Current medicines are reviewed at length with the patient today.   The patient does not have concerns regarding his medicines.  The following changes were made today:  none  Labs/ tests ordered today include:  Orders Placed This Encounter  Procedures   Basic metabolic panel   CBC   EKG 12-Lead     Disposition:   FU with Mickayla Trouten 3 months  Signed, Nasean Zapf Meredith Leeds, Barton  05/17/2021 3:58 PM     [facility] [address] [place] [address] [phone] (office) [phone] (fax)

## 2021-05-17 NOTE — Addendum Note (Signed)
Addended by: Stanton Kidney on: 05/17/2021 03:55 PM   Modules accepted: Orders

## 2021-05-17 NOTE — Patient Instructions (Signed)
Medication Instructions:  Your physician recommends that you continue on your current medications as directed. Please refer to the Current Medication list given to you today.     * If you need a refill on your cardiac medications before your next appointment, please call your pharmacy. *   Labwork: See instructions below   Testing/Procedures: Your physician has recommended that you have a pacemaker inserted. A pacemaker is a small device that is placed under the skin of your chest or abdomen to help control abnormal heart rhythms. This device uses electrical pulses to prompt the heart to beat at a normal rate. Pacemakers are used to treat heart rhythms that are too slow. Wire (leads) are attached to the pacemaker that goes into the chambers of you heart. This is done in the hospital and usually requires and overnight stay. Please follow the instructions below, located under the special instructions section.   Follow-Up: Your physician recommends that you schedule a wound check appointment 10-14 days, after your procedure on 07/07/2021, with the device clinic.  Your physician recommends that you schedule a follow up appointment in 91 days, after your procedure on 07/07/2021, with Dr. Curt Bears.   Thank you for choosing [facility]!!   Trinidad Curet, RN [phone]   Any Other Special Instructions Will Be Listed Below (If Applicable).     Implantable Device Instructions  You are scheduled for:                  _____ Permanent Transvenous Pacemaker  on  07/07/2021  with Dr. Curt Bears .  1.   Please arrive at the [facility], Entrance "A"  at [facility] at  12:30 pm on the day of your procedure. (The address is [address]  Do not eat or drink after midnight the night before your procedure.  3.   Complete pre procedure  lab work between 9/19 - 10/7 at the [facility] location. You do not have to be fasting.  4.   A) - Do NOT take these medications for  2  days prior to your procedure: Eliquis.  Take your last dose 10/9           - Hold the following medications the morning of your procedure: Lasix           - All of your remaining medications may be taken with a small amount of water the morning of your procedure.  5.  Plan for an overnight stay, but you may be discharged home after your procedure. If you use your phone frequently bring your phone charger, in case you have to stay.  If you are discharged after your procedure you will need someone to drive you home and be with your for 24 hours after your procedure.  6.  Wash your chest and neck with surgical scrub the evening before and the morning of your procedure.  Rinse well. Please review the surgical scrub instruction sheet below.                                                                                                                *  If you have ANY questions after you get home, please call Trinidad Curet, RN @ [phone].  * Every attempt is made to prevent procedures from being rescheduled.  Due to the nature of  Electrophysiology, rescheduling can happen.  The physician is always aware and directs the staff when this occurs.    [place] - Preparing For Surgery (surgical scrub)  Before surgery, you can play an important role. Because skin is not sterile, your skin needs to be as free of germs as possible. You can reduce the number of germs on your skin by washing with CHG (chlorahexidine gluconate) Soap before surgery.  CHG is an antiseptic cleaner which kills germs and bonds with the skin to continue killing germs even after washing.   Please do not use if you have an allergy to CHG or antibacterial soaps.  If your skin becomes reddened/irritated stop using the CHG.   Do not shave (including legs and underarms) for at least 48 hours prior to first CHG shower.  It is OK to shave your face.  Please follow these instructions carefully:  1.  Shower the night before surgery and  the morning of surgery with CHG.  2.  If you choose to wash your hair, wash your hair first as usual with your normal shampoo.  3.  After you shampoo, rinse your hair and body thoroughly to remove the shampoo.  4.  Use CHG as you would any other liquid soap.  You can apply CHG directly to the skin and wash gently with a clean washcloth. 5.  Apply the CHG Soap to your body ONLY FROM THE NECK DOWN.  Do not use on open wounds or open sores.  Avoid contact with your eyes, ears, mouth and genitals (private parts).  Wash genitals (private parts) with your normal soap.  6.  Wash thoroughly, paying special attention to the area where your surgery will be performed.  7.  Thoroughly rinse your body with warm water from the neck down.   8.  DO NOT shower/wash with your normal soap after using and rinsing off the CHG soap.  9.  Pat yourself dry with a clean towel.           10.  Wear clean pajamas.           11.  Place clean sheets on your bed the night of your first shower and do not sleep with pets.  Day of Surgery: Do not apply any deodorants/lotions.  Please wear clean clothes to the hospital/surgery center.     Pacemaker Implantation, Adult Pacemaker implantation is a procedure to place a pacemaker inside your chest. A pacemaker is a small computer that sends electrical signals to the heart and helps your heart beat normally. A pacemaker also stores information about your heart rhythms. You may need pacemaker implantation if you: Have a slow heartbeat (bradycardia). Faint (syncope). Have shortness of breath (dyspnea) due to heart problems.  The pacemaker attaches to your heart through a wire, called a lead. Sometimes just one lead is needed. Other times, there will be two leads. There are two types of pacemakers: Transvenous pacemaker. This type is placed under the skin or muscle of your chest. The lead goes through a vein in the chest area to reach the inside of the heart. Epicardial pacemaker.  This type is placed under the skin or muscle of your chest or belly. The lead goes through your chest to the outside of the heart.  Tell  a health care provider about: Any allergies you have. All medicines you are taking, including vitamins, herbs, eye drops, creams, and over-the-counter medicines. Any problems you or family members have had with anesthetic medicines. Any blood or bone disorders you have. Any surgeries you have had. Any medical conditions you have. Whether you are pregnant or may be pregnant. What are the risks? Generally, this is a safe procedure. However, problems may occur, including: Infection. Bleeding. Failure of the pacemaker or the lead. Collapse of a lung or bleeding into a lung. Blood clot inside a blood vessel with a lead. Damage to the heart. Infection inside the heart (endocarditis). Allergic reactions to medicines.  What happens before the procedure? Staying hydrated Follow instructions from your health care provider about hydration, which may include: Up to 2 hours before the procedure - you may continue to drink clear liquids, such as water, clear fruit juice, black coffee, and plain tea.  Eating and drinking restrictions Follow instructions from your health care provider about eating and drinking, which may include: 8 hours before the procedure - stop eating heavy meals or foods such as meat, fried foods, or fatty foods. 6 hours before the procedure - stop eating light meals or foods, such as toast or cereal. 6 hours before the procedure - stop drinking milk or drinks that contain milk. 2 hours before the procedure - stop drinking clear liquids.  Medicines Ask your health care provider about: Changing or stopping your regular medicines. This is especially important if you are taking diabetes medicines or blood thinners. Taking medicines such as aspirin and ibuprofen. These medicines can thin your blood. Do not take these medicines before your  procedure if your health care provider instructs you not to. You may be given antibiotic medicine to help prevent infection. General instructions You will have a heart evaluation. This may include an electrocardiogram (ECG), chest X-ray, and heart imaging (echocardiogram,  or echo) tests. You will have blood tests. Do not use any products that contain nicotine or tobacco, such as cigarettes and e-cigarettes. If you need help quitting, ask your health care provider. Plan to have someone take you home from the hospital or clinic. If you will be going home right after the procedure, plan to have someone with you for 24 hours. Ask your health care provider how your surgical site will be marked or identified. What happens during the procedure? To reduce your risk of infection: Your health care team will wash or sanitize their hands. Your skin will be washed with soap. Hair may be removed from the surgical area. An IV tube will be inserted into one of your veins. You will be given one or more of the following: A medicine to help you relax (sedative). A medicine to numb the area (local anesthetic). A medicine to make you fall asleep (general anesthetic). If you are getting a transvenous pacemaker: An incision will be made in your upper chest. A pocket will be made for the pacemaker. It may be placed under the skin or between layers of muscle. The lead will be inserted into a blood vessel that returns to the heart. While X-rays are taken by an imaging machine (fluoroscopy), the lead will be advanced through the vein to the inside of your heart. The other end of the lead will be tunneled under the skin and attached to the pacemaker. If you are getting an epicardial pacemaker: An incision will be made near your ribs or breastbone (sternum) for the lead. The  lead will be attached to the outside of your heart. Another incision will be made in your chest or upper belly to create a pocket for the  pacemaker. The free end of the lead will be tunneled under the skin and attached to the pacemaker. The transvenous or epicardial pacemaker will be tested. Imaging studies may be done to check the lead position. The incisions will be closed with stitches (sutures), adhesive strips, or skin glue. Bandages (dressing) will be placed over the incisions. The procedure may vary among health care providers and hospitals. What happens after the procedure? Your blood pressure, heart rate, breathing rate, and blood oxygen level will be monitored until the medicines you were given have worn off. You will be given antibiotics and pain medicine. ECG and chest x-rays will be done. You will wear a continuous type of ECG (Holter monitor) to check your heart rhythm. Your health care provider will program the pacemaker. Do not drive for 24 hours if you received a sedative. This information is not intended to replace advice given to you by your health care provider. Make sure you discuss any questions you have with your health care provider. Document Released: 09/02/2002 Document Revised: 04/01/2016 Document Reviewed: 02/24/2016 Elsevier Interactive Patient Education  2018 [employer].     Pacemaker Implantation, Adult, Care After This sheet gives you information about how to care for yourself after your procedure. Your health care provider may also give you more specific instructions. If you have problems or questions, contact your health care provider. What can I expect after the procedure? After the procedure, it is common to have: Mild pain. Slight bruising. Some swelling over the incision. A slight bump over the skin where the device was placed. Sometimes, it is possible to feel the device under the skin. This is normal.  Follow these instructions at home: Medicines Take over-the-counter and prescription medicines only as told by your health care provider. If you were prescribed an antibiotic  medicine, take it as told by your health care provider. Do not stop taking the antibiotic even if you start to feel better. Wound care Do not remove the bandage on your chest until directed to do so by your health care provider. After your bandage is removed, you may see pieces of tape called skin adhesive strips over the area where the cut was made (incision site). Let them fall off on their own. Check the incision site every day to make sure it is not infected, bleeding, or starting to pull apart. Do not use lotions or ointments near the incision site unless directed to do so. Keep the incision area clean and dry for 2-3 days after the procedure or as directed by your health care provider. It takes several weeks for the incision site to completely heal. Do not take baths, swim, or use a hot tub for 7-10 days or as otherwise directed by your health care provider. Activity Do not drive or use heavy machinery while taking prescription pain medicine. Do not drive for 24 hours if you were given a medicine to help you relax (sedative). Check with your health care provider before you start to drive or play sports. Avoid sudden jerking, pulling, or chopping movements that pull your upper arm far away from your body. Avoid these movements for at least 6 weeks or as long as told by your health care provider. Do not lift your upper arm above your shoulders for at least 6 weeks or as long as told  by your health care provider. This means no tennis, golf, or swimming. You may go back to work when your health care provider says it is okay. Pacemaker care You may be shown how to transfer data from your pacemaker through the phone to your health care provider. Always let all health care providers know about your pacemaker before you have any medical procedures or tests. Wear a medical ID bracelet or necklace stating that you have a pacemaker. Carry a pacemaker ID card with you at all times. Your pacemaker  battery will last for 5-15 years. Routine checks by your health care provider will let the health care provider know when the battery is starting to run down. The pacemaker will need to be replaced when the battery starts to run down. Do not use amateur Chief of Staff. Other electrical devices are safe to use, including power tools, lawn mowers, and speakers. If you are unsure of whether something is safe to use, ask your health care provider. When using your cell phone, hold it to the ear opposite the pacemaker. Do not leave your cell phone in a pocket over the pacemaker. Avoid places or objects that have a strong electric or magnetic field, including: Engineer, maintenance. When at the airport, let officials know that you have a pacemaker. Power plants. Large electrical generators. Radiofrequency transmission towers, such as cell phone and radio towers. General instructions Weigh yourself every day. If you suddenly gain weight, fluid may be building up in your body. Keep all follow-up visits as told by your health care provider. This is important. Contact a health care provider if: You gain weight suddenly. Your legs or feet swell. It feels like your heart is fluttering or skipping beats (heart palpitations). You have chills or a fever. You have more redness, swelling, or pain around your incisions. You have more fluid or blood coming from your incisions. Your incisions feel warm to the touch. You have pus or a bad smell coming from your incisions. Get help right away if: You have chest pain. You have trouble breathing or are short of breath. You become extremely tired. You are light-headed or you faint. This information is not intended to replace advice given to you by your health care provider. Make sure you discuss any questions you have with your health care provider. Document Released: 04/01/2005 Document Revised: 06/24/2016 Document Reviewed:  06/24/2016 Elsevier Interactive Patient Education  2018 [place] Discharge Instructions for  Pacemaker/Defibrillator Patients  ACTIVITY No heavy lifting or vigorous activity with your left/right arm for 6 to 8 weeks.  Do not raise your left/right arm above your head for one week.  Gradually raise your affected arm as drawn below.           __  NO DRIVING for     ; you may begin driving on     .  WOUND CARE Keep the wound area clean and dry.  Do not get this area wet for one week. No showers for one week; you may shower on     . The tape/steri-strips on your wound will fall off; do not pull them off.  No bandage is needed on the site.  DO  NOT apply any creams, oils, or ointments to the wound area. If you notice any drainage or discharge from the wound, any swelling or bruising at the site, or you develop a fever > 101? F after you are discharged home, call  the office at once.  SPECIAL INSTRUCTIONS You are still able to use cellular telephones; use the ear opposite the side where you have your pacemaker/defibrillator.  Avoid carrying your cellular phone near your device. When traveling through airports, show security personnel your identification card to avoid being screened in the metal detectors.  Ask the security personnel to use the hand wand. Avoid arc welding equipment, MRI testing (magnetic resonance imaging), TENS units (transcutaneous nerve stimulators).  Call the office for questions about other devices. Avoid electrical appliances that are in poor condition or are not properly grounded. Microwave ovens are safe to be near or to operate.  ADDITIONAL INFORMATION FOR DEFIBRILLATOR PATIENTS SHOULD YOUR DEVICE GO OFF: If your device goes off ONCE and you feel fine afterward, notify the device clinic nurses. If your device goes off ONCE and you do not feel well afterward, call 911. If your device goes off TWICE, call 911. If your device goes off THREE TIMES  IN ONE DAY, call 911.  DO NOT DRIVE YOURSELF OR A FAMILY MEMBER WITH A DEFIBRILLATOR TO THE HOSPITAL--CALL 911.

## 2021-05-21 DIAGNOSIS — S93402A Sprain of unspecified ligament of left ankle, initial encounter: Secondary | ICD-10-CM | POA: Insufficient documentation

## 2021-05-21 HISTORY — DX: Sprain of unspecified ligament of left ankle, initial encounter: S93.402A

## 2021-06-21 ENCOUNTER — Telehealth: Payer: Self-pay | Admitting: Cardiology

## 2021-06-21 NOTE — Telephone Encounter (Signed)
Patient is requesting to speak with Dr. Curt Bears' nurse. He states he would like to reschedule his PPM implant for 2023 and he would like to discuss further with RN. Please return call when able.

## 2021-06-28 NOTE — Telephone Encounter (Signed)
Returned call to Pt.  He would like to hold off on pacemaker implant at this time.  He has an upcoming work up with oncology, and feels that he should delay his pacemaker implant until that work up is complete.  Pt states his heart rates have been between 55-70 beats per minute.  Has not had any symptoms of syncope, presyncope, dizziness.  Advised Pt to discuss with Dr. Raliegh Ip (has appt 07/15/2021) to discuss further.  Will send Pt dates available in January 2023 for reschedule.  Per Pt ok to send via mychart.  He will respond.

## 2021-07-06 ENCOUNTER — Telehealth: Payer: Self-pay | Admitting: Cardiology

## 2021-07-06 NOTE — Telephone Encounter (Signed)
Louis Barton is calling requesting to speak with Sonia Baller in regards to setting up a date to get his pacemaker.

## 2021-07-06 NOTE — Telephone Encounter (Signed)
Pt would like move up procedure date to 11/30. Aware I will be in touch to go over instructions. Patient verbalized understanding and agreeable to plan.

## 2021-07-15 ENCOUNTER — Other Ambulatory Visit: Payer: Self-pay

## 2021-07-15 ENCOUNTER — Ambulatory Visit (INDEPENDENT_AMBULATORY_CARE_PROVIDER_SITE_OTHER): Payer: Medicare Other | Admitting: Cardiology

## 2021-07-15 ENCOUNTER — Encounter: Payer: Self-pay | Admitting: Cardiology

## 2021-07-15 VITALS — BP 130/78 | HR 50 | Ht 68.0 in | Wt 181.0 lb

## 2021-07-15 DIAGNOSIS — C3492 Malignant neoplasm of unspecified part of left bronchus or lung: Secondary | ICD-10-CM

## 2021-07-15 DIAGNOSIS — I5032 Chronic diastolic (congestive) heart failure: Secondary | ICD-10-CM | POA: Diagnosis not present

## 2021-07-15 DIAGNOSIS — I10 Essential (primary) hypertension: Secondary | ICD-10-CM

## 2021-07-15 DIAGNOSIS — I4892 Unspecified atrial flutter: Secondary | ICD-10-CM | POA: Diagnosis not present

## 2021-07-15 DIAGNOSIS — I483 Typical atrial flutter: Secondary | ICD-10-CM | POA: Diagnosis not present

## 2021-07-15 DIAGNOSIS — R001 Bradycardia, unspecified: Secondary | ICD-10-CM

## 2021-07-15 MED ORDER — FUROSEMIDE 40 MG PO TABS: 20.0000 mg | ORAL_TABLET | Freq: Every day | ORAL | 3 refills | Status: DC

## 2021-07-15 NOTE — Progress Notes (Signed)
Cardiology Office Note:    Date:  07/15/2021   ID:  Louis Barton, DOB 1941/03/07, MRN [id number]  PCP:  Raina Mina., MD  Cardiologist:  Jenne Campus, MD    Referring MD: Raina Mina., MD   Chief Complaint  Patient presents with   Follow-up  I am doing fine  History of Present Illness:    Louis Barton is a 80 y.o. male   with quite complex past medical history.  That include coronary artery disease, mild nonobstructive, cardiac catheterization done in May 2022 showed 20% mid and distal circumflex as well as 20% of mid LAD lesion.  Also history of essential hypertension first-degree AV block, status post bilateral carotic endarterectomy, non-small cell lung cancer status post wedge resection and right lower lobe, remote history of TIA.  Recently he ended up being in [facility] because of episode of atrial flutter with slow ventricular rate.  He did have TEE done and then eventually electrical cardioversion.  Few months ago he came to hospital with confusion and suspicion for CVA/TIA has been raised however future evaluation showed found that he was positive for COVID-19.  Luckily recovered quite nicely.  While he was in the hospital he was bradycardic with 2-1 AV conduction.  He was eventually referred to EP team for consideration of pacemaker implantation he was scheduled to have it done however postpone it initially want to have it done at the beginning of January but now he is scheduled for November 30. He comes today to my office for follow-up overall he seems to be doing well he actually told me that he went back to gym at the gym however he went on elliptical was able to do only 3 minutes and he is disappointed with himself.  Notices heart rate slowing down no dizziness no passing out.  No shortness of breath chest pain tightness squeezing pressure burning chest.  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    Coronary arteriosclerosis 11/16/2015   Formatting of this note might be different from the original. 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 signficant DDD present with vacuum effect L4-5 and L5-S1 flat discs and anterior spurring noted   Dizziness 05/06/2020   Dyslipidemia 03/18/2015   Dyspnea on exertion 04/02/2020   Ear itch 05/14/2018   Edema 11/16/2015   Edema of both lower extremities due to peripheral venous insufficiency 02/03/2020   First degree AV block 10/20/2020   History of hiatal hernia    seen on last scan- as slight    History of thrombocytopenia    History of TIAs    Hyperlipidemia    Hypertension    Irritable bowel syndrome 11/17/2016   rec  trial of citrucel/ diet 11/16/2016 >>>      Local edema 02/07/2018   Malaise and fatigue 12/08/2020   Malignant neoplasm of left lung ([place]) 12/08/2017   Stage 1A. [place]. And Dr. 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 Renal parapelvic cysts 10/06/16 on CT , right kidney midpole 1.2 cm, right kidney anterior pole 1.1 cm, and to the left  kidney lower pole anterior position 3.5cm all felt to be benign   Right bundle branch block 10/20/2020   Right lower lobe pulmonary nodule 11/09/2017   Shingles    internal- obstruction of bowels & gallbladder   Solitary pulmonary nodule 11/16/2016   CT ABd 03/24/09 linerar densities in RLL and RML c/w scarring o/w clear CT Abd 10/20/16 c/w 7 mm nodule  - CT chest 02/13/2017 :  slt enlarged to 9 mm   - PET 04/04/17 :1. These slowly enlarging 9 mm right lower lobe pulmonary nodule along the right hemidiaphragm does not appear hypermetabolic today. Location adjacent to the hemidiaphragm can cause false negatives due to motion artifact related to the   Stage 3a chronic kidney disease ([place]) 06/17/2020   Stroke ([place])    Swelling of limb-Left foot 01/01/2015   Vitamin B12 deficiency 06/21/2017    Past Surgical History:  Procedure Laterality Date   adenocarcinoma  2019   Removed   CARDIOVERSION N/A 03/22/2021   Procedure: CARDIOVERSION;  Surgeon: Lelon Perla, MD;  Location: [place];  Service: Cardiovascular;  Laterality: N/A;   CAROTID ENDARTERECTOMY  2005   Left carotid by Dr. Oneida Alar   CAROTID ENDARTERECTOMY  04/25/11   Right Carotid by Dr. Oneida Alar   COLONOSCOPY  02/23/2017   Colonic polyp status post polypectomy. Pancolonic diverticulosis predominatly in the sigmoid colon. Tubular adenoma   LEFT HEART CATH AND CORONARY ANGIOGRAPHY N/A 02/08/2021   Procedure: LEFT HEART CATH AND CORONARY ANGIOGRAPHY;  Surgeon: Burnell Blanks, MD;  Location: [place] [facility];  Service: Cardiovascular;  Laterality: N/A;   Septoplasty, nasal w/ submucosal resection  1960   SQUAMOUS CELL CARCINOMA EXCISION     pt said numerous times   TEE WITHOUT CARDIOVERSION N/A 03/22/2021   Procedure: TRANSESOPHAGEAL ECHOCARDIOGRAM (TEE);  Surgeon: Lelon Perla, MD;  Location: [place];  Service: Cardiovascular;  Laterality: N/A;   TONSILLECTOMY     VIDEO ASSISTED THORACOSCOPY (VATS)/WEDGE RESECTION Right  11/09/2017   Procedure: RIGHT VIDEO ASSISTED THORACOSCOPY WITH Chales Salmon RESECTION;  Surgeon: [name], MD;  Location: [place];  Service: Thoracic;  Laterality: Right;    Current Medications: Current Meds  Medication Sig   acetaminophen (TYLENOL) 500 MG tablet Take 1,000 mg by mouth every 6 (six) hours as needed for mild pain or moderate pain (for pain.).   amLODipine (NORVASC) 2.5 MG tablet Take 1 tablet (2.5 mg total) by mouth daily.   apixaban (ELIQUIS) 5 MG TABS tablet Take 1 tablet (5 mg total) by mouth 2 (two) times daily.   Cholecalciferol (VITAMIN D3) 50 MCG (2000 UT) TABS Take 2,000 Units by mouth daily.   colesevelam (WELCHOL) 625 MG tablet Take 1,250 mg by mouth 2 (two) times daily with a meal.   fluticasone (FLONASE) 50 MCG/ACT nasal spray Place 1 spray  into both nostrils daily as needed for allergies.   Folic Acid (FOLATE PO) Take 1 tablet by mouth daily with lunch. Unknown strength   Polyethyl Glycol-Propyl Glycol (SYSTANE ULTRA OP) Place 1 drop into both eyes daily. Unknown strength   potassium chloride SA (KLOR-CON) 20 MEQ tablet Take 1 tablet (20 mEq total) by mouth daily.   rosuvastatin (CRESTOR) 40 MG tablet Take 1 tablet (40 mg total) by mouth at bedtime.   valACYclovir (VALTREX) 1000 MG tablet Take 1,000 mg by mouth 2 (two) times daily as needed (for fever blisters.).    [DISCONTINUED] furosemide (LASIX) 40 MG tablet Take 1 tablet (40 mg total) by mouth daily.     Allergies:   Patient has no known allergies.   Social History   Socioeconomic History   Marital status: Married    Spouse name: Not on file   Number of children: 2   Years of education: Not on file   Highest education level: Not on file  Occupational History   Not on file  Tobacco Use   Smoking status: Former    Packs/day: 1.00    Years: 16.00    Pack years: 16.00    Types: Cigarettes    Quit date: 09/26/1972    Years since quitting: 48.8   Smokeless tobacco: Never  Vaping Use   Vaping  Use: Never used  Substance and Sexual Activity   Alcohol use: Not Currently    Alcohol/week: 2.0 standard drinks    Types: 2 Glasses of wine per week   Drug use: No   Sexual activity: Not on file  Other Topics Concern   Not on file  Social History Narrative   Not on file   Social Determinants of Health   Financial Resource Strain: Not on file  Food Insecurity: Not on file  Transportation Needs: Not on file  Physical Activity: Not on file  Stress: Not on file  Social Connections: Not on file     Family History: The patient's family history includes Cancer in his brother and sister; Cancer (age of onset: 62) in his mother; Deep vein thrombosis in his mother; Diabetes in his father and mother; Heart attack in his father and mother; Heart disease in his mother; Hyperlipidemia in his mother; Hypertension in his mother; Other in his mother and sister; Stroke in his mother; Varicose Veins in his mother. There is no history of Colon cancer or Esophageal cancer. ROS:   Please see the history of present illness.    All 14 point review of systems negative except as described per history of present illness  EKGs/Labs/Other Studies Reviewed:      Recent Labs: 03/18/2021: B Natriuretic Peptide 187.1 05/05/2021: Magnesium 1.7; TSH 0.946 05/06/2021: ALT 14 05/17/2021: BUN 15; Creatinine, Ser 0.86; Hemoglobin 15.8; Platelets 206; Potassium 4.3; Sodium 138  Recent Lipid Panel    Component Value Date/Time   CHOL 159 11/21/2018 0836   TRIG 82 11/21/2018 0836   HDL 59 11/21/2018 0836   CHOLHDL 2.7 11/21/2018 0836   LDLCALC 84 11/21/2018 0836    Physical Exam:    VS:  BP 130/78 (BP Location: Right Arm, Patient Position: Sitting, Cuff Size: Normal)   Pulse (!) 50   Ht 5\' 8"  (1.727 m)   Wt 181 lb (82.1 kg)   SpO2 95%   BMI 27.52 kg/m     Wt Readings from Last 3 Encounters:  07/15/21 181 lb (82.1 kg)  05/17/21 175 lb 3.2 oz (79.5 kg)  05/12/21  181 lb (82.1 kg)     GEN:  Well  nourished, well developed in no acute distress HEENT: Normal NECK: No JVD; No carotid bruits LYMPHATICS: No lymphadenopathy CARDIAC: RRR, no murmurs, no rubs, no gallops RESPIRATORY:  Clear to auscultation without rales, wheezing or rhonchi  ABDOMEN: Soft, non-tender, non-distended MUSCULOSKELETAL:  No edema; No deformity  SKIN: Warm and dry LOWER EXTREMITIES: no swelling NEUROLOGIC:  Alert and oriented x 3 PSYCHIATRIC:  Normal affect   ASSESSMENT:    1. Chronic heart failure with preserved ejection fraction (HCC)   2. Paroxysmal atrial flutter ([place])   3. Typical atrial flutter ([place])   4. Primary hypertension   5. Malignant neoplasm of left lung, unspecified part of lung ([place])   6. Symptomatic bradycardia    PLAN:    In order of problems listed above:  Paroxysmal atrial flutter.  EKG today sadly show again the presence of atrial flutter with controlled ventricular rate 71 there are also some PVCs present.  He is anticoagulated which I will continue.  I will not give him any antiarrhythmic medication or IV blocking agent because of bradycardia and history of second-degree AV block.  I think the proper way to manage this problem is to pursue pacemaker implantation that he is already scheduled to have it done and then consider cardioversion versus antiarrhythmic therapy or even ablation. Essential hypertension: Blood pressure seems to well controlled today continue present management. Swelling of lower extremities he takes 40 mg of Lasix on as-needed basis which is usually 2 or 3 times a week.  He is asking me if he can take 20 mg daily which I told him yes next week we will check Chem-7 especially potassium and that if we did have difficulty before. History of malignant lung cancer.  That being followed by oncology. Congestive heart failure with preserved left ventricle ejection fraction overall seems to be compensated today does have minimal swelling of lower extremities as always     Medication Adjustments/Labs and Tests Ordered: Current medicines are reviewed at length with the patient today.  Concerns regarding medicines are outlined above.  Orders Placed This Encounter  Procedures   Basic metabolic panel   EKG 16-OMAY   Medication changes:  Meds ordered this encounter  Medications   furosemide (LASIX) 40 MG tablet    Sig: Take 0.5 tablets (20 mg total) by mouth daily.    Dispense:  90 tablet    Refill:  3    Signed, Park Liter, MD, [facility] 07/15/2021 10:58 AM    [place]

## 2021-07-15 NOTE — Patient Instructions (Addendum)
Medication Instructions:  Your physician has recommended you make the following change in your medication:   Decrease your lasix (furosemide) to 20 mg daily.  *If you need a refill on your cardiac medications before your next appointment, please call your pharmacy*   Lab Work: Your physician recommends that you return for lab work in: 1 week for a BMET. You will not need an appointment or to fast.  If you have labs (blood work) drawn today and your tests are completely normal, you will receive your results only by: [place] (if you have MyChart) OR A paper copy in the mail If you have any lab test that is abnormal or we need to change your treatment, we will call you to review the results.   Testing/Procedures: None ordered   Follow-Up: At [facility], you and your health needs are our priority.  As part of our continuing mission to provide you with exceptional heart care, we have created designated Provider Care Teams.  These Care Teams include your primary Cardiologist (physician) and Advanced Practice Providers (APPs -  Physician Assistants and Nurse Practitioners) who all work together to provide you with the care you need, when you need it.  We recommend signing up for the patient portal called "MyChart".  Sign up information is provided on this After Visit Summary.  MyChart is used to connect with patients for Virtual Visits (Telemedicine).  Patients are able to view lab/test results, encounter notes, upcoming appointments, etc.  Non-urgent messages can be sent to your provider as well.   To learn more about what you can do with MyChart, go to [email or web address].    Your next appointment:   3 month(s)  The format for your next appointment:   In Person  Provider:   Jenne Campus, MD   Other Instructions NA

## 2021-07-19 ENCOUNTER — Telehealth: Payer: Self-pay | Admitting: Cardiology

## 2021-07-19 NOTE — Telephone Encounter (Signed)
Per pt felt fine yesterday was watching football and just happen to check it Pt remains asymptomatic and all morning HR has been in the 50's Will await return call from nurse to reschedule procedure ./cy

## 2021-07-19 NOTE — Telephone Encounter (Signed)
Pt reports mostly non symptomatic low HRs. Says HRs have been running low, in the 40/50s, for while now. Recently a few HRs have been dropping into the 30s, but it doesn't last more than 30 seconds. Pt did have some dizziness yesterday. He would like to move up his procedure (PPM implant). Aware I will see what I can do and let him know this week. Pt appreciative of seeing what we can do.

## 2021-07-19 NOTE — Telephone Encounter (Signed)
STAT if HR is under 50 or over 120 (normal HR is 60-100 beats per minute)  What is your heart rate? 44 yesterday and low forties, this morning 54- yesterday had to put salt in his hand and put in his mouth and drink some water it brought heart rate up to 53,54, and 55  Do you have a log of your heart rate readings (document readings)? yes  Do you have any other symptoms? No symptoms-he wants his pacemaker put in this week if possible or sooner than 08-25-21., it is now scheduled for 08-25-21

## 2021-07-20 NOTE — Telephone Encounter (Signed)
Pt returning call from [facility] yesterday. Please advise pt further.  Pt can be reached at [place]  number has been updated in pt's demographics

## 2021-07-21 NOTE — Telephone Encounter (Signed)
Patient calling back.   °

## 2021-07-21 NOTE — Telephone Encounter (Signed)
Pt aware I will be in touch by the end of  the week with a plan. He is agreeable to plan.

## 2021-07-23 ENCOUNTER — Telehealth: Payer: Self-pay | Admitting: Cardiology

## 2021-07-23 NOTE — Telephone Encounter (Signed)
Called pt and offered 11/2 to move PPM implant date to...Marland KitchenMarland Kitchen pt agreeable to plan. Aware I will send updated instructions via mychart. Advised to call office if questions. Patient verbalized understanding and agreeable to plan.

## 2021-07-23 NOTE — Telephone Encounter (Signed)
Patient is requesting to speak with Dr. Curt Bears' nurse to schedule PPM implant as previously discussed. Please assist when able.

## 2021-07-26 LAB — BASIC METABOLIC PANEL
BUN/Creatinine Ratio: 8 — ABNORMAL LOW (ref 10–24)
BUN: 8 mg/dL (ref 8–27)
CO2: 24 mmol/L (ref 20–29)
Calcium: 9.3 mg/dL (ref 8.6–10.2)
Chloride: 103 mmol/L (ref 96–106)
Creatinine, Ser: 0.97 mg/dL (ref 0.76–1.27)
Glucose: 96 mg/dL (ref 70–99)
Potassium: 4.2 mmol/L (ref 3.5–5.2)
Sodium: 140 mmol/L (ref 134–144)
eGFR: 78 mL/min/{1.73_m2} (ref >59)

## 2021-07-27 NOTE — Pre-Procedure Instructions (Signed)
Instructed patient on the following items: Arrival time 1330 Nothing to eat or drink after midnight No meds AM of procedure Responsible person to drive you home and stay with you for 24 hrs Wash with special soap night before and morning of procedure If on anti-coagulant drug instructions Eliquis- last dose was Sunday 10/30.

## 2021-07-28 ENCOUNTER — Ambulatory Visit (HOSPITAL_COMMUNITY): Payer: Medicare Other

## 2021-07-28 ENCOUNTER — Ambulatory Visit (HOSPITAL_COMMUNITY)
Admission: RE | Admit: 2021-07-28 | Discharge: 2021-07-28 | Disposition: A | Discharge status: Home / Self Care | Payer: Medicare Other | Source: Ambulatory Visit | Attending: Cardiology | Admitting: Cardiology

## 2021-07-28 ENCOUNTER — Other Ambulatory Visit: Payer: Self-pay

## 2021-07-28 ENCOUNTER — Encounter (HOSPITAL_COMMUNITY)
Admission: RE | Disposition: A | Discharge status: Home / Self Care | Payer: Self-pay | Source: Ambulatory Visit | Attending: Cardiology

## 2021-07-28 DIAGNOSIS — I129 Hypertensive chronic kidney disease with stage 1 through stage 4 chronic kidney disease, or unspecified chronic kidney disease: Secondary | ICD-10-CM | POA: Insufficient documentation

## 2021-07-28 DIAGNOSIS — I441 Atrioventricular block, second degree: Secondary | ICD-10-CM | POA: Diagnosis not present

## 2021-07-28 DIAGNOSIS — I2510 Atherosclerotic heart disease of native coronary artery without angina pectoris: Secondary | ICD-10-CM | POA: Diagnosis not present

## 2021-07-28 DIAGNOSIS — R001 Bradycardia, unspecified: Secondary | ICD-10-CM

## 2021-07-28 DIAGNOSIS — Z8249 Family history of ischemic heart disease and other diseases of the circulatory system: Secondary | ICD-10-CM | POA: Diagnosis not present

## 2021-07-28 DIAGNOSIS — Z87891 Personal history of nicotine dependence: Secondary | ICD-10-CM | POA: Diagnosis not present

## 2021-07-28 DIAGNOSIS — Z8673 Personal history of transient ischemic attack (TIA), and cerebral infarction without residual deficits: Secondary | ICD-10-CM | POA: Diagnosis not present

## 2021-07-28 DIAGNOSIS — N1831 Chronic kidney disease, stage 3a: Secondary | ICD-10-CM | POA: Diagnosis not present

## 2021-07-28 DIAGNOSIS — R5383 Other fatigue: Secondary | ICD-10-CM | POA: Insufficient documentation

## 2021-07-28 DIAGNOSIS — I459 Conduction disorder, unspecified: Secondary | ICD-10-CM

## 2021-07-28 DIAGNOSIS — Z95818 Presence of other cardiac implants and grafts: Secondary | ICD-10-CM

## 2021-07-28 HISTORY — PX: PACEMAKER IMPLANT: EP1218

## 2021-07-28 SURGERY — PACEMAKER IMPLANT

## 2021-07-28 MED ORDER — FENTANYL CITRATE (PF) 100 MCG/2ML IJ SOLN
INTRAMUSCULAR | Status: DC | PRN
  Administered 2021-07-28: 25 ug via INTRAVENOUS

## 2021-07-28 MED ORDER — LIDOCAINE HCL (PF) 1 % IJ SOLN
INTRAMUSCULAR | Status: DC | PRN
  Administered 2021-07-28: 60 mL

## 2021-07-28 MED ORDER — CHLORHEXIDINE GLUCONATE 4 % EX LIQD: 4.0000 "application " | Freq: Once | CUTANEOUS | Status: DC

## 2021-07-28 MED ORDER — CEFAZOLIN SODIUM-DEXTROSE 2-4 GM/100ML-% IV SOLN
INTRAVENOUS | Status: AC
  Filled 2021-07-28: qty 100

## 2021-07-28 MED ORDER — CEFAZOLIN SODIUM-DEXTROSE 2-4 GM/100ML-% IV SOLN
2.0000 g | INTRAVENOUS | Status: AC
  Administered 2021-07-28: 2 g via INTRAVENOUS

## 2021-07-28 MED ORDER — SODIUM CHLORIDE 0.9 % IV SOLN
80.0000 mg | INTRAVENOUS | Status: AC
  Administered 2021-07-28: 80 mg

## 2021-07-28 MED ORDER — HEPARIN (PORCINE) IN NACL 1000-0.9 UT/500ML-% IV SOLN
INTRAVENOUS | Status: DC | PRN
  Administered 2021-07-28: 500 mL

## 2021-07-28 MED ORDER — HYDRALAZINE HCL 20 MG/ML IJ SOLN
INTRAMUSCULAR | Status: DC | PRN
  Administered 2021-07-28: 10 mg via INTRAVENOUS

## 2021-07-28 MED ORDER — ACETAMINOPHEN 325 MG PO TABS: 325.0000 mg | ORAL_TABLET | ORAL | Status: DC | PRN

## 2021-07-28 MED ORDER — LIDOCAINE HCL (PF) 1 % IJ SOLN
INTRAMUSCULAR | Status: AC
  Filled 2021-07-28: qty 60

## 2021-07-28 MED ORDER — SODIUM CHLORIDE 0.9 % IV SOLN: INTRAVENOUS | Status: DC

## 2021-07-28 MED ORDER — APIXABAN 5 MG PO TABS: 5.0000 mg | ORAL_TABLET | Freq: Two times a day (BID) | ORAL | 3 refills | Status: DC

## 2021-07-28 MED ORDER — MIDAZOLAM HCL 5 MG/5ML IJ SOLN
INTRAMUSCULAR | Status: AC
  Filled 2021-07-28: qty 5

## 2021-07-28 MED ORDER — SODIUM CHLORIDE 0.9 % IV SOLN
INTRAVENOUS | Status: AC
  Filled 2021-07-28: qty 2

## 2021-07-28 MED ORDER — CEFAZOLIN SODIUM-DEXTROSE 1-4 GM/50ML-% IV SOLN
1.0000 g | Freq: Four times a day (QID) | INTRAVENOUS | Status: DC
  Administered 2021-07-28: 1 g via INTRAVENOUS
  Filled 2021-07-28 (×2): qty 50

## 2021-07-28 MED ORDER — ONDANSETRON HCL 4 MG/2ML IJ SOLN: 4.0000 mg | Freq: Four times a day (QID) | INTRAMUSCULAR | Status: DC | PRN

## 2021-07-28 MED ORDER — MIDAZOLAM HCL 5 MG/5ML IJ SOLN
INTRAMUSCULAR | Status: DC | PRN
  Administered 2021-07-28: 1 mg via INTRAVENOUS

## 2021-07-28 MED ORDER — FENTANYL CITRATE (PF) 100 MCG/2ML IJ SOLN
INTRAMUSCULAR | Status: AC
  Filled 2021-07-28: qty 2

## 2021-07-28 SURGICAL SUPPLY — 14 items
CABLE SURGICAL S-101-97-12 (CABLE) ×2 IMPLANT
CATH RIGHTSITE C315HIS02 (CATHETERS) ×1 IMPLANT
IPG PACE AZUR XT DR MRI W1DR01 (Pacemaker) IMPLANT
LEAD CAPSURE NOVUS 5076-52CM (Lead) ×1 IMPLANT
LEAD SELECT SECURE 3830 383069 (Lead) IMPLANT
MAT PREVALON FULL STRYKER (MISCELLANEOUS) ×1 IMPLANT
PACE AZURE XT DR MRI W1DR01 (Pacemaker) ×2 IMPLANT
PAD PRO RADIOLUCENT 2001M-C (PAD) ×2 IMPLANT
SELECT SECURE 3830 383069 (Lead) ×2 IMPLANT
SHEATH 7FR PRELUDE SNAP 13 (SHEATH) ×1 IMPLANT
SHEATH 9FR PRELUDE SNAP 13 (SHEATH) ×1 IMPLANT
SHEATH PROBE COVER 6X72 (BAG) ×1 IMPLANT
TRAY PACEMAKER INSERTION (PACKS) ×2 IMPLANT
WIRE HITORQ VERSACORE ST 145CM (WIRE) ×1 IMPLANT

## 2021-07-28 NOTE — H&P (View-Only) (Signed)
Electrophysiology Office Note   Date:  07/28/2021   ID:  Louis Barton, DOB 1941-06-28, MRN [id number]  PCP:  Raina Mina., MD  Cardiologist: Agustin Cree Primary Electrophysiologist:  Arbutus Nelligan Meredith Leeds, MD    Chief Complaint: Fatigue   History of Present Illness: Louis Barton is a 80 y.o. male who is being seen today for the evaluation of second-degree heart block at the request of No ref. provider found. Presenting today for electrophysiology evaluation.  He has a history significant for nonobstructive coronary artery disease, hypertension, bilateral carotid endarterectomy, non-small cell lung cancer status post wedge resection, TIA.  He went to [facility] with atrial flutter and slow ventricular rate.    He presented to the hospital in early August 2020 with confusion.  He was noted to be COVID-positive.  He was bradycardic in 2-1 conduction.  Today, denies symptoms of palpitations, chest pain, shortness of breath, orthopnea, PND, lower extremity edema, claudication, dizziness, presyncope, syncope, bleeding, or neurologic sequela. The patient is tolerating medications without difficulties. Plan ablation today.    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 On imaging;   Cramps of left lower extremity-Calf  > right calf 01/01/2015    Degeneration of lumbar intervertebral disc 11/16/2015   Formatting of this note might be different from the original. signficant DDD present with vacuum effect L4-5 and L5-S1 flat discs and anterior spurring noted   Dizziness 05/06/2020   Dyslipidemia 03/18/2015   Dyspnea on exertion 04/02/2020   Ear itch 05/14/2018   Edema 11/16/2015   Edema of both lower extremities due to peripheral venous insufficiency 02/03/2020   First degree AV block 10/20/2020   History of hiatal hernia    seen on last scan- as slight    History of thrombocytopenia    History of TIAs    Hyperlipidemia    Hypertension    Irritable bowel syndrome 11/17/2016   rec trial of citrucel/ diet 11/16/2016 >>>      Local edema 02/07/2018   Malaise and fatigue 12/08/2020   Malignant neoplasm of left lung ([place]) 12/08/2017   Stage 1A. [place]. And Dr. 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 Renal parapelvic cysts 10/06/16 on CT , right kidney midpole 1.2 cm, right kidney anterior pole 1.1 cm, and to the left kidney lower pole anterior position 3.5cm all felt to be benign   Right bundle branch block 10/20/2020   Right lower lobe pulmonary nodule 11/09/2017   Shingles  internal- obstruction of bowels & gallbladder   Solitary pulmonary nodule 11/16/2016   CT ABd 03/24/09 linerar densities in RLL and RML c/w scarring o/w clear CT Abd 10/20/16 c/w 7 mm nodule  - CT chest 02/13/2017 :  slt enlarged to 9 mm   - PET 04/04/17 :1. These slowly enlarging 9 mm right lower lobe pulmonary nodule along the right hemidiaphragm does not appear hypermetabolic today. Location adjacent to the hemidiaphragm can cause false negatives due to motion artifact related to the    Stage 3a chronic kidney disease ([place]) 06/17/2020   Stroke ([place])    Swelling of limb-Left foot 01/01/2015   Vitamin B12 deficiency 06/21/2017   Past Surgical History:  Procedure Laterality Date   adenocarcinoma  2019   Removed   CARDIOVERSION N/A 03/22/2021   Procedure: CARDIOVERSION;  Surgeon: Lelon Perla, MD;  Location: [place];  Service: Cardiovascular;  Laterality: N/A;   CAROTID ENDARTERECTOMY  2005   Left carotid by Dr. Oneida Alar   CAROTID ENDARTERECTOMY  04/25/11   Right Carotid by Dr. Oneida Alar   COLONOSCOPY  02/23/2017   Colonic polyp status post polypectomy. Pancolonic diverticulosis predominatly in the sigmoid colon. Tubular adenoma   LEFT HEART CATH AND CORONARY ANGIOGRAPHY N/A 02/08/2021   Procedure: LEFT HEART CATH AND CORONARY ANGIOGRAPHY;  Surgeon: Burnell Blanks, MD;  Location: [place] CV LAB;  Service: Cardiovascular;  Laterality: N/A;   Septoplasty, nasal w/ submucosal resection  1960   SQUAMOUS CELL CARCINOMA EXCISION     pt said numerous times   TEE WITHOUT CARDIOVERSION N/A 03/22/2021   Procedure: TRANSESOPHAGEAL ECHOCARDIOGRAM (TEE);  Surgeon: Lelon Perla, MD;  [facility] ENDOSCOPY;  Service: Cardiovascular;  Laterality: N/A;   TONSILLECTOMY     VIDEO ASSISTED THORACOSCOPY (VATS)/WEDGE RESECTION Right 11/09/2017   Procedure: RIGHT VIDEO ASSISTED THORACOSCOPY WITH [place];  Surgeon: [name], MD;  Location: [place];  Service: Thoracic;  Laterality: Right;     Current Facility-Administered Medications  Medication Dose Route Frequency Provider Last Rate Last Admin   0.9 %  sodium chloride infusion   Intravenous Continuous Saheed Carrington Hassell Done, MD       ceFAZolin (ANCEF) IVPB 2g/100 mL premix  2 g Intravenous On Call Jasimine Simms Hassell Done, MD       chlorhexidine (HIBICLENS) 4 % liquid 4 application  4 application Topical Once Jerl Munyan Hassell Done, MD       gentamicin (GARAMYCIN) 80 mg in sodium chloride 0.9 % 500 mL irrigation   80 mg Irrigation On Call Constance Haw, MD        Allergies:   Patient has no known allergies.   Social History:  The patient  reports that he quit smoking about 48 years ago. His smoking use included cigarettes. He has a 16.00 pack-year smoking history. He has never used smokeless tobacco. He reports that he does not currently use alcohol after a past usage of about 2.0 standard drinks per week. He reports that he does not use drugs.   Family History:  The patient's family history includes Cancer in his brother and sister; Cancer (age of onset: 36) in his mother; Deep vein thrombosis in his mother; Diabetes in his father and mother; Heart attack in his father and mother; Heart disease in his mother; Hyperlipidemia in his mother; Hypertension in his mother; Other in his mother and sister; Stroke in his mother; Varicose Veins in his mother.   ROS:  Please see the history of present illness.  Otherwise, review of systems is positive for none.   All other systems are reviewed and negative.   PHYSICAL EXAM: VS:  BP (!) 152/86   Pulse 73   Temp 98 F (36.7 C) (Oral)   Resp 15   Ht 5\' 8"  (1.727 m)   Wt 78.5 kg   SpO2 99%   BMI 26.30 kg/m  , BMI Body mass index is 26.3 kg/m. GEN: Well nourished, well developed, in no acute distress  HEENT: normal  Neck: no JVD, carotid bruits, or masses Cardiac: RRR; no murmurs, rubs, or gallops,no edema  Respiratory:  clear to auscultation bilaterally, normal work of breathing GI: soft, nontender, nondistended, + BS MS: no deformity or atrophy  Skin: warm and dry Neuro:  Strength and sensation are intact Psych: euthymic mood, full affect  Recent Labs: 03/18/2021: B Natriuretic Peptide 187.1 05/05/2021: Magnesium 1.7; TSH 0.946 05/06/2021: ALT 14 05/17/2021: Hemoglobin 15.8; Platelets 206 07/26/2021: BUN 8; Creatinine, Ser 0.97; Potassium 4.2; Sodium 140    Lipid Panel     Component Value Date/Time   CHOL 159 11/21/2018 0836   TRIG 82  11/21/2018 0836   HDL 59 11/21/2018 0836   CHOLHDL 2.7 11/21/2018 0836   LDLCALC 84 11/21/2018 0836     Wt Readings from Last 3 Encounters:  07/28/21 78.5 kg  07/15/21 82.1 kg  05/17/21 79.5 kg      Other studies Reviewed: Additional studies/ records that were reviewed today include: TTE 03/19/21  Review of the above records today demonstrates:   1. Left ventricular ejection fraction, by estimation, is 60 to 65%. The  left ventricle has normal function. The left ventricle has no regional  wall motion abnormalities. There is mild left ventricular hypertrophy.  Left ventricular diastolic parameters  are indeterminate.   2. Right ventricular systolic function is normal. The right ventricular  size is normal.   3. The mitral valve is normal in structure. Mild mitral valve  regurgitation. No evidence of mitral stenosis.   4. The aortic valve is calcified. Aortic valve regurgitation is trivial.  Mild to moderate aortic valve sclerosis/calcification is present, without  any evidence of aortic stenosis.   5. The inferior vena cava is normal in size with greater than 50%  respiratory variability, suggesting right atrial pressure of 3 mmHg.    ASSESSMENT AND PLAN:  1.  Second-degree AV block: DELORIS MITTAG has presented today for surgery, with the diagnosis of heart block.  The various methods of treatment have been discussed with the patient and family. After consideration of risks, benefits and other options for treatment, the patient has consented to  Procedure(s): Pacemaker implant as a surgical intervention .  Risks include but not limited to bleeding, infection, pneumothorax, perforation, tamponade, vascular damage, renal failure, MI, stroke, death, and lead dislodgement . The patient's history has been reviewed, patient examined, no change in status, stable for surgery.  I have reviewed the patient's chart and labs.  Questions were answered to the patient's satisfaction.    Dequavion Follette  Curt Bears, MD 07/28/2021 10:16 AM

## 2021-07-28 NOTE — H&P (Signed)
Electrophysiology Office Note   Date:  07/28/2021   ID:  Louis Barton, DOB 06-15-1941, MRN [id number]  PCP:  Raina Mina., MD  Cardiologist: Agustin Cree Primary Electrophysiologist:  Sindi Beckworth Meredith Leeds, MD    Chief Complaint: Fatigue   History of Present Illness: Louis Barton is a 80 y.o. male who is being seen today for the evaluation of second-degree heart block at the request of No ref. provider found. Presenting today for electrophysiology evaluation.  He has a history significant for nonobstructive coronary artery disease, hypertension, bilateral carotid endarterectomy, non-small cell lung cancer status post wedge resection, TIA.  He went to [facility] with atrial flutter and slow ventricular rate.    He presented to the hospital in early August 2020 with confusion.  He was noted to be COVID-positive.  He was bradycardic in 2-1 conduction.  Today, denies symptoms of palpitations, chest pain, shortness of breath, orthopnea, PND, lower extremity edema, claudication, dizziness, presyncope, syncope, bleeding, or neurologic sequela. The patient is tolerating medications without difficulties. Plan ablation today.    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 On imaging;   Cramps of left lower extremity-Calf  > right calf 01/01/2015    Degeneration of lumbar intervertebral disc 11/16/2015   Formatting of this note might be different from the original. signficant DDD present with vacuum effect L4-5 and L5-S1 flat discs and anterior spurring noted   Dizziness 05/06/2020   Dyslipidemia 03/18/2015   Dyspnea on exertion 04/02/2020   Ear itch 05/14/2018   Edema 11/16/2015   Edema of both lower extremities due to peripheral venous insufficiency 02/03/2020   First degree AV block 10/20/2020   History of hiatal hernia    seen on last scan- as slight    History of thrombocytopenia    History of TIAs    Hyperlipidemia    Hypertension    Irritable bowel syndrome 11/17/2016   rec trial of citrucel/ diet 11/16/2016 >>>      Local edema 02/07/2018   Malaise and fatigue 12/08/2020   Malignant neoplasm of left lung ([place]) 12/08/2017   Stage 1A. [place]. And Dr. [name]   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   Occlusion and stenosis of carotid artery without mention of cerebral infarction 12/01/2011   PAD (peripheral artery disease) ([place])    Prediabetes 12/25/2018   Preop cardiovascular exam 04/14/2011   Renal cysts, acquired, bilateral 10/24/2016   Formatting of this note might be different from the original. Renal parapelvic cysts 10/06/16 on CT , right kidney midpole 1.2 cm, right kidney anterior pole 1.1 cm, and to the left kidney lower pole anterior position 3.5cm all felt to be benign   Right bundle branch block 10/20/2020   Right lower lobe pulmonary nodule 11/09/2017   Shingles  internal- obstruction of bowels & gallbladder   Solitary pulmonary nodule 11/16/2016   CT ABd 03/24/09 linerar densities in RLL and RML c/w scarring o/w clear CT Abd 10/20/16 c/w 7 mm nodule  - CT chest 02/13/2017 :  slt enlarged to 9 mm   - PET 04/04/17 :1. These slowly enlarging 9 mm right lower lobe pulmonary nodule along the right hemidiaphragm does not appear hypermetabolic today. Location adjacent to the hemidiaphragm can cause false negatives due to motion artifact related to the    Stage 3a chronic kidney disease ([place]) 06/17/2020   Stroke ([place])    Swelling of limb-Left foot 01/01/2015   Vitamin B12 deficiency 06/21/2017   Past Surgical History:  Procedure Laterality Date   adenocarcinoma  2019   Removed   CARDIOVERSION N/A 03/22/2021   Procedure: CARDIOVERSION;  Surgeon: Lelon Perla, MD;  Location: [place];  Service: Cardiovascular;  Laterality: N/A;   CAROTID ENDARTERECTOMY  2005   Left carotid by Dr. Oneida Alar   CAROTID ENDARTERECTOMY  04/25/11   Right Carotid by Dr. Oneida Alar   COLONOSCOPY  02/23/2017   Colonic polyp status post polypectomy. Pancolonic diverticulosis predominatly in the sigmoid colon. Tubular adenoma   LEFT HEART CATH AND CORONARY ANGIOGRAPHY N/A 02/08/2021   Procedure: LEFT HEART CATH AND CORONARY ANGIOGRAPHY;  Surgeon: Burnell Blanks, MD;  Location: [place] CV LAB;  Service: Cardiovascular;  Laterality: N/A;   Septoplasty, nasal w/ submucosal resection  1960   SQUAMOUS CELL CARCINOMA EXCISION     pt said numerous times   TEE WITHOUT CARDIOVERSION N/A 03/22/2021   Procedure: TRANSESOPHAGEAL ECHOCARDIOGRAM (TEE);  Surgeon: Lelon Perla, MD;  Location: [facility] ENDOSCOPY;  Service: Cardiovascular;  Laterality: N/A;   TONSILLECTOMY     VIDEO ASSISTED THORACOSCOPY (VATS)/WEDGE RESECTION Right 11/09/2017   Procedure: RIGHT VIDEO ASSISTED THORACOSCOPY WITH [place];  Surgeon: [name], MD;  Location: [place];  Service: Thoracic;  Laterality: Right;     Current Facility-Administered Medications  Medication Dose Route Frequency Provider Last Rate Last Admin   0.9 %  sodium chloride infusion   Intravenous Continuous Redell Bhandari Hassell Done, MD       ceFAZolin (ANCEF) IVPB 2g/100 mL premix  2 g Intravenous On Call Layton Tappan Hassell Done, MD       chlorhexidine (HIBICLENS) 4 % liquid 4 application  4 application Topical Once Thai Burgueno Hassell Done, MD       gentamicin (GARAMYCIN) 80 mg in sodium chloride 0.9 % 500 mL irrigation   80 mg Irrigation On Call Constance Haw, MD        Allergies:   Patient has no known allergies.   Social History:  The patient  reports that he quit smoking about 48 years ago. His smoking use included cigarettes. He has a 16.00 pack-year smoking history. He has never used smokeless tobacco. He reports that he does not currently use alcohol after a past usage of about 2.0 standard drinks per week. He reports that he does not use drugs.   Family History:  The patient's family history includes Cancer in his brother and sister; Cancer (age of onset: 4) in his mother; Deep vein thrombosis in his mother; Diabetes in his father and mother; Heart attack in his father and mother; Heart disease in his mother; Hyperlipidemia in his mother; Hypertension in his mother; Other in his mother and sister; Stroke in his mother; Varicose Veins in his mother.   ROS:  Please see the history of present illness.  Otherwise, review of systems is positive for none.   All other systems are reviewed and negative.   PHYSICAL EXAM: VS:  BP (!) 152/86   Pulse 73   Temp 98 F (36.7 C) (Oral)   Resp 15   Ht 5\' 8"  (1.727 m)   Wt 78.5 kg   SpO2 99%   BMI 26.30 kg/m  , BMI Body mass index is 26.3 kg/m. GEN: Well nourished, well developed, in no acute distress  HEENT: normal  Neck: no JVD, carotid bruits, or masses Cardiac: RRR; no murmurs, rubs, or gallops,no edema  Respiratory:  clear to auscultation bilaterally, normal work of breathing GI: soft, nontender, nondistended, + BS MS: no deformity or atrophy  Skin: warm and dry Neuro:  Strength and sensation are intact Psych: euthymic mood, full affect  Recent Labs: 03/18/2021: B Natriuretic Peptide 187.1 05/05/2021: Magnesium 1.7; TSH 0.946 05/06/2021: ALT 14 05/17/2021: Hemoglobin 15.8; Platelets 206 07/26/2021: BUN 8; Creatinine, Ser 0.97; Potassium 4.2; Sodium 140    Lipid Panel     Component Value Date/Time   CHOL 159 11/21/2018 0836   TRIG 82  11/21/2018 0836   HDL 59 11/21/2018 0836   CHOLHDL 2.7 11/21/2018 0836   LDLCALC 84 11/21/2018 0836     Wt Readings from Last 3 Encounters:  07/28/21 78.5 kg  07/15/21 82.1 kg  05/17/21 79.5 kg      Other studies Reviewed: Additional studies/ records that were reviewed today include: TTE 03/19/21  Review of the above records today demonstrates:   1. Left ventricular ejection fraction, by estimation, is 60 to 65%. The  left ventricle has normal function. The left ventricle has no regional  wall motion abnormalities. There is mild left ventricular hypertrophy.  Left ventricular diastolic parameters  are indeterminate.   2. Right ventricular systolic function is normal. The right ventricular  size is normal.   3. The mitral valve is normal in structure. Mild mitral valve  regurgitation. No evidence of mitral stenosis.   4. The aortic valve is calcified. Aortic valve regurgitation is trivial.  Mild to moderate aortic valve sclerosis/calcification is present, without  any evidence of aortic stenosis.   5. The inferior vena cava is normal in size with greater than 50%  respiratory variability, suggesting right atrial pressure of 3 mmHg.    ASSESSMENT AND PLAN:  1.  Second-degree AV block: ETAI COPADO has presented today for surgery, with the diagnosis of heart block.  The various methods of treatment have been discussed with the patient and family. After consideration of risks, benefits and other options for treatment, the patient has consented to  Procedure(s): Pacemaker implant as a surgical intervention .  Risks include but not limited to bleeding, infection, pneumothorax, perforation, tamponade, vascular damage, renal failure, MI, stroke, death, and lead dislodgement . The patient's history has been reviewed, patient examined, no change in status, stable for surgery.  I have reviewed the patient's chart and labs.  Questions were answered to the patient's satisfaction.    Maxmilian Trostel  Curt Bears, MD 07/28/2021 10:16 AM

## 2021-07-28 NOTE — Progress Notes (Signed)
Discharge instructions reviewed with pt and his wife both voice understanding. Informed to restart Eliqulis  tomorrow evening.

## 2021-07-28 NOTE — Discharge Instructions (Signed)
After Your Pacemaker   You have a [employer]onic Pacemaker  ACTIVITY Do not lift your arm above shoulder height for 1 week after your procedure. After 7 days, you may progress as below.  You should remove your sling 24 hours after your procedure, unless otherwise instructed by your provider.     Wednesday August 04, 2021  Thursday August 05, 2021 Friday August 06, 2021 Saturday August 07, 2021   Do not lift, push, pull, or carry anything over 10 pounds with the affected arm until 6 weeks (Wednesday September 08, 2021 ) after your procedure.   You may drive AFTER your wound check, unless you have been told otherwise by your provider.   Ask your healthcare provider when you can go back to work   INCISION/Dressing If you are on a blood thinner such as Coumadin, Xarelto, Eliquis, Plavix, or Pradaxa please confirm with your provider when this should be resumed.   If large square, outer bandage is left in place, this can be removed after 24 hours from your procedure. Do not remove steri-strips or glue as below.   Monitor your Pacemaker site for redness, swelling, and drainage. Call the device clinic at [phone] if you experience these symptoms or fever/chills.  If your incision is sealed with Steri-strips or staples, you may shower 10 days after your procedure or when told by your provider. Do not remove the steri-strips or let the shower hit directly on your site. You may wash around your site with soap and water.    If you were discharged in a sling, please do not wear this during the day more than 48 hours after your surgery unless otherwise instructed. This may increase the risk of stiffness and soreness in your shoulder.   Avoid lotions, ointments, or perfumes over your incision until it is well-healed.  You may use a hot tub or a pool AFTER your wound check appointment if the incision is completely closed.  PAcemaker Alerts:  Some alerts are vibratory and others beep. These are  NOT emergencies. Please call our office to let us know. If this occurs at night or on weekends, it can wait until the next business day. Send a remote transmission.  If your device is capable of reading fluid status (for heart failure), you will be offered monthly monitoring to review this with you.   DEVICE MANAGEMENT Remote monitoring is used to monitor your pacemaker from home. This monitoring is scheduled every 91 days by our office. It allows [place] to keep an eye on the functioning of your device to ensure it is working properly. You will routinely see your Electrophysiologist annually (more often if necessary).   You should receive your ID card for your new device in 4-8 weeks. Keep this card with you at all times once received. Consider wearing a medical alert bracelet or necklace.  Your Pacemaker may be MRI compatible. This will be discussed at your next office visit/wound check.  You should avoid contact with strong electric or magnetic fields.   Do not use amateur (ham) radio equipment or electric (arc) welding torches. MP3 player headphones with magnets should not be used. Some devices are safe to use if held at least 12 inches (30 cm) from your Pacemaker. These include power tools, lawn mowers, and speakers. If you are unsure if something is safe to use, ask your health care provider.  When using your cell phone, hold it to the ear that is on the opposite side from the  Pacemaker. Do not leave your cell phone in a pocket over the Pacemaker.  You may safely use electric blankets, heating pads, computers, and microwave ovens.  Call the office right away if: You have chest pain. You feel more short of breath than you have felt before. You feel more light-headed than you have felt before. Your incision starts to open up.  This information is not intended to replace advice given to you by your health care provider. Make sure you discuss any questions you have with your health care provider.

## 2021-07-29 ENCOUNTER — Encounter (HOSPITAL_COMMUNITY): Payer: Medicare Other

## 2021-07-29 ENCOUNTER — Ambulatory Visit: Payer: Medicare Other

## 2021-07-29 ENCOUNTER — Encounter (HOSPITAL_COMMUNITY): Payer: Self-pay | Admitting: Cardiology

## 2021-07-29 ENCOUNTER — Telehealth: Payer: Self-pay

## 2021-07-29 NOTE — Telephone Encounter (Signed)
Follow-up after same day discharge: Implant date: 07/28/21 MD: Allegra Lai, MD Device: [employer] PPM  Location: Left chest    Wound check visit: 08/11/21 90 day MD follow-up: 11/23/20  Remote Transmission received:Yes- registered relay monitor in [facility]link and assisted with manual transmission.  Pt noted to be in [place] which is known.  Pt is on [place].    Dressing removed: Not yet, educated to remove today along with sling.  Advised of s/s of infection to monitor for and report.  Keep steri-strips dry and intact until wound check.  Reviewed activity restrictions.

## 2021-07-29 NOTE — Telephone Encounter (Signed)
-----   Message from Shirley Friar, PA-C sent at 07/29/2021  9:09 AM EDT ----- Same day PPM 07/28/2021 WC

## 2021-08-04 ENCOUNTER — Telehealth: Payer: Self-pay | Admitting: Cardiology

## 2021-08-04 NOTE — Telephone Encounter (Signed)
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 please advise

## 2021-08-04 NOTE — Telephone Encounter (Signed)
Called patient to have device clinic apt for possible PNS.   Patient would like to wait until his upcoming apt. 08/11/21. States it is not bothering him much and not at night. He will call back if increases or begins to bother him.

## 2021-08-05 ENCOUNTER — Ambulatory Visit (INDEPENDENT_AMBULATORY_CARE_PROVIDER_SITE_OTHER): Payer: Medicare Other

## 2021-08-05 ENCOUNTER — Other Ambulatory Visit: Payer: Self-pay

## 2021-08-05 DIAGNOSIS — R001 Bradycardia, unspecified: Secondary | ICD-10-CM | POA: Diagnosis not present

## 2021-08-05 NOTE — Progress Notes (Signed)
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 RA sensing and impedance consistant with implant measurement. 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 +OAC. RV lead 3830 for L bundle pacing. Failure to capture at max output and pulse width. Patient admits to reaching overhead several days ago just prior to onset of continuous hiccups. Dr. Curt Bears made aware and patient will be scheduled for lead revision. Not dependent today. Reprogrammed to [facility] to prevent PNS with pacing. Steri-strips remain intact with no evidence of bleeding or infection. Estimated longevity 13 years 11 months. Patient enrolled in remote follow-up. Will follow up post lead revision. Patient aware Dr. Curt Bears RN will call to schedule.

## 2021-08-09 NOTE — Progress Notes (Signed)
[facility]  [address] [place],  [place]  [address] [phone]  Clinic Day:  08/18/2021  Referring physician: Raina Mina., MD  This document serves as a record of services personally performed by Hosie Poisson, MD. It was created on their behalf by [facility], a trained medical scribe. The creation of this record is based on the scribe's personal observations and the provider's statements to them.  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  We began seeing him in March 2019 for follow-up.  He was found to have an enlarging right lower lobe nodule in January 2019 and had a wedge resection by VATS procedure by Dr. Modesto Charon in February.  Pathology revealed an invasive moderately differentiated adenocarcinoma measuring 1 cm with 14 - nodes.  His CEA was elevated at 9 postoperatively.He is on observation only.  He is also being followed by the urologist for multiple renal cysts.  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  Colonoscopy in May 2018 revealed colon polyps with pathology revealing tubular adenomas.  Upper endoscopy and colonoscopy in 2019 did not reveal any malignancy, so we feel we have ruled out a gastrointestinal source as the cause of the increased CEA.  The CEA increased to 10.5 in November, but then was back down to 8.9 in February.  Repeat CT imaging of the chest, abdomen and pelvis did not reveal any evidence of malignancy.  Previously seen right pleural effusion had decreased, there was a similar appearance of the suspected rounded atelectasis at the right lung base, which was also decreased.  CEA at that time was 9.9.  There was stable bilateral renal cysts.  He had surgery of the left lateral and  posterior neck for additional squamous cell carcinomas of the skin.  Repeat CT chest, abdomen and pelvis in October revealed stable rounded presumed atelectasis in the right lower lobe along the resection site with stable scarring, stable volume loss in the right lower lobe and right middle lobe, as well as stable small adjacent right pleural effusion with adjacent pleural thickening without changes suggestive of active malignancy.  The bilateral renal cysts were stable.  CT imaging from May 2021 revealed stable appearance of right pleural thickening, trace pleural effusion and adjacent density along the wedge resection site favoring rounded atelectasis and scarring in the right lower lobe.  No compelling findings of active malignancy.  CEA was 10.5, previously 9.7, but this tends to fluctuate up and down in this range.  He has some residual effects from COVID-19 that he had in November 2020. CEA from May 2022 was up to 11.0 from 10.3 the year before. CT scans of abdomen and pelvis were negative. Last CT chest was in September 2021 and showed scarring only.  INTERVAL HISTORY:  Louis Barton is here for routine follow up and states that he is doing fair. Cardiac catheterization from May revealed only 20% stenosis. He states that he had a pacemaker placed this month, and also had a lead replaced Monday. He remains limited in range of motion of the left arm. He continues to follow up with [name] and will see him again on January 24th. He had COVID for a second time back in April 2022,  which was fairly severe. He did have an episode of anxiety recently. He has had persistent productive cough with milky white to clear sputum for the past several months. CBC from November 18th was normal and chemistries from today are unremarkable. His  appetite is good, and he has gained 6 and 1/2 pounds since his last visit.  He denies fever, chills or other signs of infection.  He denies nausea, vomiting, bowel issues, or  abdominal pain.  He denies sore throat, cough, dyspnea, or chest pain.  REVIEW OF SYSTEMS:  Review of Systems  Constitutional:  Positive for fatigue (mild). Negative for appetite change, chills, fever and unexpected weight change.  HENT:  Negative.    Eyes: Negative.   Respiratory:  Positive for cough (productive with milky white to clear sputum and sinus drainage). Negative for chest tightness, hemoptysis, shortness of breath and wheezing.   Cardiovascular: Negative.  Negative for chest pain, leg swelling and palpitations.  Gastrointestinal: Negative.  Negative for abdominal distention, abdominal pain, blood in stool, constipation, diarrhea, nausea and vomiting.  Endocrine: Negative.   Genitourinary: Negative.  Negative for difficulty urinating, dysuria, frequency and hematuria.   Musculoskeletal: Negative.  Negative for arthralgias, back pain, flank pain, gait problem and myalgias.       Tenderness and limited range of motion of the left shoulder and arm due to recent pacemaker placement  Skin: Negative.   Neurological: Negative.  Negative for dizziness, extremity weakness, gait problem, headaches, light-headedness, numbness, seizures and speech difficulty.  Hematological: Negative.   Psychiatric/Behavioral:  Negative for depression and sleep disturbance. The patient is nervous/anxious (occasional).   All other systems reviewed and are negative.   VITALS:  Blood pressure 116/62, pulse 69, temperature 97.7 F (36.5 C), temperature source Oral, resp. rate 20, height 5\' 8"  (1.727 m), weight 181 lb 8 oz (82.3 kg), SpO2 97 %.  Wt Readings from Last 3 Encounters:  08/18/21 181 lb 8 oz (82.3 kg)  08/16/21 175 lb (79.4 kg)  08/11/21 178 lb 9.6 oz (81 kg)    Body mass index is 27.6 kg/m.  Performance status (ECOG): 1 - Symptomatic but completely ambulatory  PHYSICAL EXAM:  Physical Exam Constitutional:      General: He is not in acute distress.    Appearance: Normal appearance. He is  normal weight.  HENT:     Head: Normocephalic and atraumatic.  Eyes:     General: No scleral icterus.    Extraocular Movements: Extraocular movements intact.     Conjunctiva/sclera: Conjunctivae normal.     Pupils: Pupils are equal, round, and reactive to light.  Cardiovascular:     Rate and Rhythm: Normal rate and regular rhythm.     Pulses: Normal pulses.     Heart sounds: Normal heart sounds. No murmur heard.   No friction rub. No gallop.  Pulmonary:     Effort: Pulmonary effort is normal. No respiratory distress.     Breath sounds: Decreased breath sounds (of the right base) present.  Chest:     Comments: Mild swelling of the left upper chest with steri-strips in place but appears to be healing well. Abdominal:     General: Bowel sounds are normal. There is no distension.     Palpations: Abdomen is soft. There is no hepatomegaly, splenomegaly or mass.     Tenderness: There is no abdominal tenderness.  Musculoskeletal:        General: Normal range of motion.     Cervical back: Normal range of  motion and neck supple.     Right lower leg: 1+ Edema present.     Left lower leg: 2+ Edema present.  Lymphadenopathy:     Cervical: No cervical adenopathy.  Skin:    General: Skin is warm and dry.  Neurological:     General: No focal deficit present.     Mental Status: He is alert and oriented to person, place, and time. Mental status is at baseline.  Psychiatric:        Mood and Affect: Mood normal.        Behavior: Behavior normal.        Thought Content: Thought content normal.        Judgment: Judgment normal.    LABS:   CBC Latest Ref Rng & Units 08/13/2021 05/17/2021 05/06/2021  WBC 3.4 - 10.8 x10E3/uL 7.4 7.2 6.7  Hemoglobin 13.0 - 17.7 g/dL 14.4 15.8 14.2  Hematocrit 37.5 - 51.0 % 44.1 46.4 42.8  Platelets 150 - 450 x10E3/uL 158 206 99(L)   CMP Latest Ref Rng & Units 08/18/2021 07/26/2021 05/17/2021  Glucose 70 - 99 mg/dL - 96 91  BUN 4 - 21 14 8 15   Creatinine 0.6 -  1.3 0.9 0.97 0.86  Sodium 137 - 147 138 140 138  Potassium 3.4 - 5.3 3.7 4.2 4.3  Chloride 99 - 108 108 103 105  CO2 13 - 22 24(A) 24 27  Calcium 8.7 - 10.7 8.5(A) 9.3 9.2  Total Protein 6.5 - 8.1 g/dL - - -  Total Bilirubin 0.3 - 1.2 mg/dL - - -  Alkaline Phos 25 - 125 43 - -  AST 14 - 40 23 - -  ALT 10 - 40 15 - -     Lab Results  Component Value Date   CEA1 11.0 (H) 02/12/2021   /  CEA  Date Value Ref Range Status  02/12/2021 11.0 (H) 0.0 - 4.7 ng/mL Final    Comment:    (NOTE)                             Nonsmokers          <3.9                             Smokers             <5.6 [employer] Electrochemiluminescence Immunoassay (ECLIA) Values obtained with different assay methods or kits cannot be used interchangeably.  Results cannot be interpreted as absolute evidence of the presence or absence of malignant disease. Performed At: [facility] [place], [place] [id number] Rush Farmer MD HD:[id number]     STUDIES:  DG Chest 2 View  Result Date: 08/16/2021 CLINICAL DATA:  Cardiac device in-situ. EXAM: CHEST - 2 VIEW COMPARISON:  07/28/2021 FINDINGS: Stable mild cardiomegaly. Dual lead pacemaker remains in appropriate position. Chronic pleural-parenchymal scarring is again seen in the right lung base. No pneumothorax visualized. Left lung remains clear. IMPRESSION: Stable mild cardiomegaly and right basilar pleural-parenchymal scarring. No acute findings or active disease. Electronically Signed   By: Marlaine Hind M.D.   On: 08/16/2021 17:48   DG Chest 2 View  Result Date: 07/28/2021 CLINICAL DATA:  Cardiac device in-situ, other.  Pacemaker implant. EXAM: CHEST - 2 VIEW COMPARISON:  05/06/2021 FINDINGS: Two views of the chest demonstrate a left dual chamber cardiac pacemaker. Leads in the region  of the right atrial appendage and right ventricle. Negative for a pneumothorax. Again noted are postsurgical changes in the right lower chest with  chronic pleural and parenchymal densities in the right lower chest. Heart size is stable. IMPRESSION: 1. Placement of dual-chamber cardiac pacemaker. Negative for a pneumothorax. 2. Chronic pleural and parenchymal densities in the right lower chest with postoperative changes. Electronically Signed   By: Markus Daft M.D.   On: 07/28/2021 16:03   EP PPM/ICD IMPLANT  Result Date: 08/16/2021 SURGEON:  Will Meredith Leeds, MD   PREPROCEDURE DIAGNOSIS:  second degree AV block   POSTPROCEDURE DIAGNOSIS:  second degree AV block    PROCEDURES:  1.  RV lead revision   INTRODUCTION: MASATO PETTIE is a 80 y.o. 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  The patient reports intermittent episodes of fatigue over the past few months.  He had a prior pacemaker implant, though his RV lead threshold was elevated after device check.  He presents today for RV lead revision.   DESCRIPTION OF PROCEDURE:  Informed written consent was obtained, and  the patient was brought to the electrophysiology lab in a fasting state.  The patient required no sedation for the procedure today.  The patients left chest was prepped and draped in the usual sterile fashion by the EP lab staff. The skin overlying the left deltopectoral region was infiltrated with lidocaine for local analgesia.  A 4-cm incision was made over the left deltopectoral region.  Dissection down to the prior pacemaker was performed and the prior pacemaker was removed from the pocket.  The RV lead was disconnected from the pacemaker and using gentle traction, removed from the vein. RV Lead Placement: The left axillary vein was cannulated.  Through the left axillary vein, a Medtronic model 7169 (serial number [id number]) right ventricular lead was advanced with fluoroscopic visualization into the left bundle branch position.  Initial right ventricular lead R-waves measured 8 mV with an impedance of 793 ohms and a threshold of 0.6 V at 0.5  msec.  Both leads were secured to the pectoralis fascia using #2-0 silk over the suture sleeves. Device Placement:  The leads were then connected to the pre-existing Medtronic Azure XT DR MRI SureScan (serial number [id number]) pacemaker.  The pocket was irrigated with copious gentamicin solution.  The pacemaker was then placed into the pocket.  The pocket was then closed in 3 layers with 2.0 Vicryl suture for the subcutaneous and 3.0 Vicryl suture subcuticular layers.  Steri-Strips and a sterile dressing were then applied. EBL<48ml. There were no early apparent complications.   CONCLUSIONS:  1. Successful RV lead revision of a Medtronic Azure XT DR MRI SureScan dual-chamber pacemaker for symptomatic bradycardia  2. No early apparent complications.       Will Meredith Leeds, MD 08/16/2021 1:54 PM  EP PPM/ICD IMPLANT  Result Date: 07/28/2021 SURGEON:  Will Meredith Leeds, MD   PREPROCEDURE DIAGNOSIS:  second degree AV block   POSTPROCEDURE DIAGNOSIS:  second degree AV block    PROCEDURES:  1. Pacemaker implantation.   INTRODUCTION: Louis Barton is a 80 y.o. 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  The patient reports intermittent episodes of fatigue over the past few months.  No reversible causes have been identified.  The patient therefore presents today for pacemaker implantation.   DESCRIPTION OF PROCEDURE:  Informed written consent was obtained, and  the patient was brought  to the electrophysiology lab in a fasting state.  The patient required no sedation for the procedure today.  The patients left chest was prepped and draped in the usual sterile fashion by the EP lab staff. The skin overlying the left deltopectoral region was infiltrated with lidocaine for local analgesia.  A 4-cm incision was made over the left deltopectoral region.  A left subcutaneous pacemaker pocket was fashioned using a combination of sharp and blunt dissection. Electrocautery was required  to assure hemostasis.  RA/RV Lead Placement: The left axillary vein was cannulated.  Through the left axillary vein, a Medtronic model 5076 (serial number [id number]) right atrial lead and a Medtronic model 1610 (serial number [id number]) right ventricular lead were advanced with fluoroscopic visualization into the right atrial appendage and left bundle area positions respectively.  Initial atrial lead P- waves measured 3.52mV with impedance of 1024 ohms in atrial flutter.  Left bundle lead R-waves measured 11 mV with an impedance of 694 ohms and a threshold of 0.9 V at 0.5 msec.  Both leads were secured to the pectoralis fascia using #2-0 silk over the suture sleeves. Device Placement:  The leads were then connected to a Medtronic Azure XT DR MRI SureScan (serial number [id number]) pacemaker.  The pocket was irrigated with copious gentamicin solution.  The pacemaker was then placed into the pocket.  The pocket was then closed in 3 layers with 2.0 Vicryl suture for the subcutaneous and 3.0 Vicryl suture subcuticular layers.  Steri-Strips and a sterile dressing were then applied. EBL<20ml. There were no early apparent complications.   CONCLUSIONS:  1. Successful implantation of a Medtronic Azure XT DR MRI SureScan dual-chamber pacemaker for symptomatic bradycardia  2. No early apparent complications.       Will Meredith Leeds, MD 07/28/2021 12:08 PM  CUP PACEART INCLINIC DEVICE CHECK  Result Date: 08/11/2021 Wound check appointment. Steri-strips removed. Wound without redness or edema. Incision edges approximated, wound well healed. Device previously interrogated 08/05/21. See note. Patient awaits lead revision scheduling. Dr. Curt Bears aware.  VAS US CAROTID  Result Date: 08/11/2021 Carotid Arterial Duplex Study Patient Name:  Louis Barton  Date of Exam:   08/11/2021 Medical Rec #: [id number]      Accession #:    [id number] Date of Birth: 10-09-1940       Patient Gender: M Patient Age:   15 years Exam  Location:  [facility] Procedure:      VAS US CAROTID Referring Phys: Vonna Kotyk ROBINS --------------------------------------------------------------------------------  Indications:       HX of BIL CEA - follow up exam. Risk Factors:      Hypertension, hyperlipidemia, past history of smoking,                    coronary artery disease, prior CVA, PAD. Other Factors:     CKD, TIA, CHF, HX of BIl CEA, PM. Comparison Study:  Previous exam 07/09/2020 bilateral ICA 1039% Performing Technologist: Jody Hill RVT, RDMS  Examination Guidelines: A complete evaluation includes B-mode imaging, spectral Doppler, color Doppler, and power Doppler as needed of all accessible portions of each vessel. Bilateral testing is considered an integral part of a complete examination. Limited examinations for reoccurring indications may be performed as noted.  Right Carotid Findings: +----------+--------+--------+--------+-------------------------+--------+           PSV cm/sEDV cm/sStenosisPlaque Description       Comments +----------+--------+--------+--------+-------------------------+--------+ CCA Prox  78      13  calcific                          +----------+--------+--------+--------+-------------------------+--------+ CCA Distal82      18              heterogenous and calcific         +----------+--------+--------+--------+-------------------------+--------+ ICA Prox  48      13                                                +----------+--------+--------+--------+-------------------------+--------+ ICA Distal75      26                                                +----------+--------+--------+--------+-------------------------+--------+ ECA       69      4                                                 +----------+--------+--------+--------+-------------------------+--------+ +----------+--------+-------+----------------+-------------------+           PSV cm/sEDV  cmsDescribe        Arm Pressure (mmHG) +----------+--------+-------+----------------+-------------------+ Subclavian119            Multiphasic, WNL                    +----------+--------+-------+----------------+-------------------+ +---------+--------+--+--------+--+---------+ VertebralPSV cm/s52EDV cm/s12Antegrade +---------+--------+--+--------+--+---------+  Left Carotid Findings: +----------+--------+--------+--------+---------------------+------------------+           PSV cm/sEDV cm/sStenosisPlaque Description   Comments           +----------+--------+--------+--------+---------------------+------------------+ CCA Prox  91      26              calcific and smooth                     +----------+--------+--------+--------+---------------------+------------------+ CCA Distal92      21              calcific,                                                                 heterogenous and                                                          smooth                                  +----------+--------+--------+--------+---------------------+------------------+ ICA Prox  38      16              heterogenous and  hypoechoic                              +----------+--------+--------+--------+---------------------+------------------+ ICA Distal70      23                                                      +----------+--------+--------+--------+---------------------+------------------+ ECA       88      21                                   intimal thickening +----------+--------+--------+--------+---------------------+------------------+ +----------+--------+--------+----------------+-------------------+           PSV cm/sEDV cm/sDescribe        Arm Pressure (mmHG) +----------+--------+--------+----------------+-------------------+ [id number]              Multiphasic,  WNL                    +----------+--------+--------+----------------+-------------------+ +---------+--------+--------+--------------+ VertebralPSV cm/sEDV cm/sNot identified +---------+--------+--------+--------------+   Summary: Right Carotid: The extracranial vessels were near-normal with only minimal wall                thickening or plaque. Left Carotid: The extracranial vessels were near-normal with only minimal wall               thickening or plaque. Vertebrals:  Right vertebral artery demonstrates antegrade flow. Left vertebral              artery was not visualized. Subclavians: Normal flow hemodynamics were seen in bilateral subclavian              arteries. *See table(s) above for measurements and observations.  Electronically signed by Harold Barban MD on 08/11/2021 at 8:52:39 PM.    Final       HISTORY:   Allergies: Not on File  Current Medications: Current Outpatient Medications  Medication Sig Dispense Refill   acetaminophen (TYLENOL) 500 MG tablet Take 1,000 mg by mouth every 8 (eight) hours as needed for mild pain or moderate pain (for pain.).     amLODipine (NORVASC) 2.5 MG tablet Take 2.5 mg by mouth daily.     apixaban (ELIQUIS) 5 MG TABS tablet Take 1 tablet (5 mg total) by mouth 2 (two) times daily. Resume 11/3 with the evening dose. 180 tablet 3   Cholecalciferol (VITAMIN D3) 50 MCG (2000 UT) TABS Take 2,000 Units by mouth every other day.     colesevelam (WELCHOL) 625 MG tablet Take 1,250 mg by mouth 2 (two) times daily with a meal.     fluticasone (FLONASE) 50 MCG/ACT nasal spray Place 1 spray into both nostrils 2 (two) times daily as needed for allergies.     furosemide (LASIX) 40 MG tablet Take 0.5 tablets (20 mg total) by mouth daily. 90 tablet 3   Polyethyl Glycol-Propyl Glycol (SYSTANE ULTRA OP) Place 1 drop into both eyes daily. Unknown strength     potassium chloride (KLOR-CON) 10 MEQ tablet Take 10 mEq by mouth 2 (two) times daily.     rosuvastatin  (CRESTOR) 40 MG tablet Take 1 tablet (40 mg total) by mouth at bedtime.     valACYclovir (VALTREX) 1000 MG tablet Take 1,000 mg by mouth 2 (two) times daily as  needed (for fever blisters.).      zinc gluconate 50 MG tablet Take 50 mg by mouth 2 (two) times a week.     No current facility-administered medications for this visit.     ASSESSMENT & PLAN:   Assessment:    1. 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  He remains without evidence of disease nearly 4 years postop.  2. Mild elevation of the CEA of uncertain etiology, which tends to fluctuate up and down, but is persistent and was up to 11.0 in May 2022.  3. Multiple renal cysts being monitored by Dr. Tamala Julian, his urologist with [facility].  4. Multiple skin cancers. He had a squamous cell carcinoma resected from the upper right anterior chest.   5. History of COVID-19 from November 2020 and again in April 2022.  6.  Multiple colon polyps.  His last colonoscopy was in October 2019 with Dr. Lyndel Safe, which revealed two polyps consistent with tubular adenomas.  I therefore recommend that he undergo at least 1 more colonoscopy as he is a fairly healthy individual, especially with the persistently elevated CEA.    Plan: I will contact him with the results of the CEA. We will see him back in 6 months with CBC, CMP, CEA and CT chest for repeat evaluation. If the CEA has significantly increased, we will obtain CT imaging sooner. The patient understands the plans discussed today and is in agreement with them.  The patient knows to contact our office if his develops concerns prior to his next appointment.  I provided 30 minutes of face-to-face time during this this encounter and > 50% was spent counseling as documented under my assessment and plan.    I, Rita Ohara, am acting as scribe for Derwood Kaplan, MD  I have reviewed this report as typed by the medical scribe, and it is  complete and accurate.

## 2021-08-11 ENCOUNTER — Ambulatory Visit (HOSPITAL_COMMUNITY)
Admission: RE | Admit: 2021-08-11 | Discharge: 2021-08-11 | Disposition: A | Discharge status: Home / Self Care | Payer: Medicare Other | Source: Ambulatory Visit | Attending: Physician Assistant | Admitting: Physician Assistant

## 2021-08-11 ENCOUNTER — Ambulatory Visit (HOSPITAL_BASED_OUTPATIENT_CLINIC_OR_DEPARTMENT_OTHER)
Admission: RE | Admit: 2021-08-11 | Discharge: 2021-08-11 | Disposition: A | Discharge status: Home / Self Care | Payer: Medicare Other | Source: Ambulatory Visit

## 2021-08-11 ENCOUNTER — Encounter (HOSPITAL_COMMUNITY): Payer: Self-pay | Admitting: Physician Assistant

## 2021-08-11 ENCOUNTER — Ambulatory Visit (INDEPENDENT_AMBULATORY_CARE_PROVIDER_SITE_OTHER): Payer: Medicare Other

## 2021-08-11 ENCOUNTER — Other Ambulatory Visit: Payer: Self-pay | Admitting: *Deleted

## 2021-08-11 ENCOUNTER — Other Ambulatory Visit: Payer: Self-pay

## 2021-08-11 VITALS — BP 108/70 | HR 69 | Ht 68.0 in | Wt 178.6 lb

## 2021-08-11 DIAGNOSIS — Z85118 Personal history of other malignant neoplasm of bronchus and lung: Secondary | ICD-10-CM | POA: Diagnosis not present

## 2021-08-11 DIAGNOSIS — I4892 Unspecified atrial flutter: Secondary | ICD-10-CM | POA: Diagnosis not present

## 2021-08-11 DIAGNOSIS — I4510 Unspecified right bundle-branch block: Secondary | ICD-10-CM

## 2021-08-11 DIAGNOSIS — D6869 Other thrombophilia: Secondary | ICD-10-CM

## 2021-08-11 DIAGNOSIS — Z8673 Personal history of transient ischemic attack (TIA), and cerebral infarction without residual deficits: Secondary | ICD-10-CM | POA: Diagnosis not present

## 2021-08-11 DIAGNOSIS — Z950 Presence of cardiac pacemaker: Secondary | ICD-10-CM | POA: Insufficient documentation

## 2021-08-11 DIAGNOSIS — I2510 Atherosclerotic heart disease of native coronary artery without angina pectoris: Secondary | ICD-10-CM | POA: Diagnosis not present

## 2021-08-11 DIAGNOSIS — Z7901 Long term (current) use of anticoagulants: Secondary | ICD-10-CM | POA: Diagnosis not present

## 2021-08-11 DIAGNOSIS — I441 Atrioventricular block, second degree: Secondary | ICD-10-CM | POA: Diagnosis not present

## 2021-08-11 DIAGNOSIS — I6523 Occlusion and stenosis of bilateral carotid arteries: Secondary | ICD-10-CM

## 2021-08-11 DIAGNOSIS — I4819 Other persistent atrial fibrillation: Secondary | ICD-10-CM

## 2021-08-11 DIAGNOSIS — I130 Hypertensive heart and chronic kidney disease with heart failure and stage 1 through stage 4 chronic kidney disease, or unspecified chronic kidney disease: Secondary | ICD-10-CM | POA: Insufficient documentation

## 2021-08-11 HISTORY — DX: Other thrombophilia: D68.69

## 2021-08-11 HISTORY — DX: Other persistent atrial fibrillation: I48.19

## 2021-08-11 LAB — CUP PACEART INCLINIC DEVICE CHECK
Date Time Interrogation Session: 20221116160423
Implantable Lead Implant Date: 20221102
Implantable Lead Implant Date: 20221102
Implantable Lead Location: 753859
Implantable Lead Location: 753860
Implantable Lead Model: 3830
Implantable Lead Model: 5076
Implantable Pulse Generator Implant Date: 20221102

## 2021-08-11 NOTE — Progress Notes (Signed)
Wound check appointment. Steri-strips removed. Wound without redness or edema. Incision edges approximated, wound well healed. Device previously interrogated 08/05/21. See note. Patient awaits lead revision scheduling. Dr. Curt Bears aware.

## 2021-08-11 NOTE — Progress Notes (Signed)
Primary Care Physician: Raina Mina., MD Primary Cardiologist: Dr Agustin Cree  Primary Electrophysiologist: Dr Curt Bears Referring Physician: Dr Manning Charity is a 80 y.o. 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 Patient underwent PPM implant on 07/28/21 with Dr Curt Bears. His device interrogation at his wound check showed 36% atrial flutter burden. He has know atrial flutter and has been on Eliquis for a CHADS2VASC score of 6. He is unaware of his afib today. He does have some fatigue and dyspnea with exertion but this predates the onset of his afib. Patient admits to reaching overhead after PPM implant just prior to onset of continuous hiccups. Dr. Curt Bears made aware and patient is pending lead revision.  Today, he denies symptoms of palpitations, chest pain, orthopnea, PND, lower extremity edema, dizziness, presyncope, syncope, snoring, daytime somnolence, bleeding, or neurologic sequela. The patient is tolerating medications without difficulties and is otherwise without complaint today.    Atrial Fibrillation Risk Factors:  he does not have symptoms or diagnosis of sleep apnea. he does not have a history of rheumatic fever. he does have a history of alcohol use.   he has a BMI of Body mass index is 27.16 kg/m.[name] Filed Weights   08/11/21 1325  Weight: 81 kg    Family History  Problem Relation Age of Onset   Cancer Mother 66       Breast   Stroke Mother    Diabetes Mother    Hyperlipidemia Mother    Other Mother        varicose veins   Heart attack Mother    Heart disease Mother        Left ankle swelling   Hypertension Mother    Deep vein thrombosis Mother    Varicose Veins Mother    Diabetes Father    Heart attack Father    Other Sister        Tumor in Lung  and Brain   Cancer Sister        Tumor   Lung  and  Brain   Cancer Brother        BCC-SCC-Merkle Cell   Colon cancer Neg Hx    Esophageal cancer Neg Hx      Atrial Fibrillation Management history:  Previous antiarrhythmic drugs: none Previous cardioversions: 03/22/21 Previous ablations: none CHADS2VASC score: 6 Anticoagulation history: Eliquis   Past Medical History:  Diagnosis Date   Abnormal stress test    Aftercare following surgery of the circulatory system, NEC 12/19/2013   Arthritis    DDD- aging    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 On imaging;   Cramps of left lower extremity-Calf  > right calf 01/01/2015   Degeneration of lumbar intervertebral disc 11/16/2015  Formatting of this note might be different from the original. signficant DDD present with vacuum effect L4-5 and L5-S1 flat discs and anterior spurring noted   Dizziness 05/06/2020   Dyslipidemia 03/18/2015   Dyspnea on exertion 04/02/2020   Ear itch 05/14/2018   Edema 11/16/2015   Edema of both lower extremities due to peripheral venous insufficiency 02/03/2020   First degree AV block 10/20/2020   History of hiatal hernia    seen on last scan- as slight    History of thrombocytopenia    History of TIAs    Hyperlipidemia    Hypertension    Irritable bowel syndrome 11/17/2016   rec trial of citrucel/ diet 11/16/2016 >>>      Local edema 02/07/2018   Malaise and fatigue 12/08/2020   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 [place]. And Dr. 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 These slowly enlarging 9 mm right lower lobe pulmonary nodule along the right hemidiaphragm does not appear hypermetabolic today. Location adjacent to the hemidiaphragm can cause false negatives due to motion artifact related to the   Stage 3a chronic kidney disease ([place]) 06/17/2020   Stroke ([place])    Swelling of limb-Left foot 01/01/2015   Vitamin B12 deficiency 06/21/2017   Past Surgical History:  Procedure Laterality Date   adenocarcinoma  2019   Removed   CARDIOVERSION N/A 03/22/2021   Procedure: CARDIOVERSION;  Surgeon: Lelon Perla, MD;  Location: [facility];  Service: Cardiovascular;  Laterality: N/A;   CAROTID ENDARTERECTOMY  2005   Left carotid by Dr. Oneida Alar   CAROTID ENDARTERECTOMY  04/25/11   Right Carotid by Dr. Oneida Alar   COLONOSCOPY  02/23/2017   Colonic polyp status post polypectomy. Pancolonic diverticulosis predominatly in the sigmoid colon. Tubular adenoma   LEFT HEART CATH AND CORONARY ANGIOGRAPHY N/A 02/08/2021   Procedure: LEFT HEART CATH AND CORONARY ANGIOGRAPHY;  Surgeon: Burnell Blanks, MD;  Location: [facility];  Service: Cardiovascular;  Laterality: N/A;   PACEMAKER IMPLANT N/A 07/28/2021   Procedure: PACEMAKER IMPLANT;  Surgeon: Constance Haw, MD;  Location: [facility];  Service: Cardiovascular;  Laterality: N/A;   Septoplasty, nasal w/ submucosal resection  1960   SQUAMOUS CELL CARCINOMA EXCISION     pt said numerous times   TEE WITHOUT CARDIOVERSION N/A 03/22/2021   Procedure: TRANSESOPHAGEAL ECHOCARDIOGRAM (TEE);  Surgeon: Lelon Perla, MD;  Location: [facility];  Service: Cardiovascular;  Laterality: N/A;   TONSILLECTOMY     VIDEO ASSISTED THORACOSCOPY (VATS)/WEDGE RESECTION Right 11/09/2017   Procedure:  RIGHT VIDEO ASSISTED THORACOSCOPY WITH Chales Salmon RESECTION;  Surgeon: [name], MD;  Location: [facility];  Service: Thoracic;  Laterality: Right;    Current Outpatient Medications  Medication Sig Dispense Refill   acetaminophen (TYLENOL) 500 MG tablet Take 500-1,000 mg by mouth every 6 (six) hours as needed for mild pain or moderate pain (for pain.).     amLODipine (NORVASC) 2.5 MG tablet Take 1 tablet (2.5 mg total) by mouth daily. 180 tablet 3   apixaban (ELIQUIS) 5 MG TABS tablet Take 1 tablet (5 mg total) by mouth 2 (two) times daily. Resume 11/3 with the evening dose. 180 tablet 3   Cholecalciferol (VITAMIN D3) 50 MCG (2000 UT) TABS Take 2,000 Units by mouth daily.     colesevelam (WELCHOL) 625 MG tablet Take 1,250 mg by mouth 2 (two) times daily with a meal.     fluticasone (FLONASE) 50 MCG/ACT nasal spray Place 1 spray into both nostrils 2 (two) times daily as needed for allergies.     Folic Acid (FOLATE PO) Take 1 tablet by mouth once a week. Unknown strength     furosemide (LASIX) 40 MG tablet Take 0.5 tablets (20 mg total) by mouth daily. (Patient taking differently: Take 20 mg by mouth every 3 (three) days.) 90 tablet 3   Polyethyl Glycol-Propyl Glycol (SYSTANE ULTRA OP) Place 1 drop into both eyes daily.  Unknown strength     potassium chloride (KLOR-CON) 10 MEQ tablet Take 20 mEq by mouth daily.     rosuvastatin (CRESTOR) 40 MG tablet Take 1 tablet (40 mg total) by mouth at bedtime.     valACYclovir (VALTREX) 1000 MG tablet Take 1,000 mg by mouth 2 (two) times daily as needed (for fever blisters.).      No current facility-administered medications for this encounter.    No Known Allergies  Social History   Socioeconomic History   Marital status: Married    Spouse name: Not on file   Number of children: 2   Years of education: Not on file   Highest education level: Not on file  Occupational History   Not on file  Tobacco Use   Smoking status: Former    Packs/day: 1.00    Years: 16.00    Pack years: 16.00    Types: Cigarettes    Quit date: 09/26/1972    Years since quitting: 48.9   Smokeless tobacco: Never   Tobacco comments:    Former smoker 08/11/21  Vaping Use   Vaping Use: Never used  Substance and Sexual Activity   Alcohol use: Not Currently    Alcohol/week: 7.0 standard drinks    Types: 7 Shots of liquor per week    Comment: 1 shot daily 08/11/21   Drug use: No   Sexual activity: Not on file  Other Topics Concern   Not on file  Social History Narrative   Not on file   Social Determinants of Health   Financial Resource Strain: Not on file  Food Insecurity: Not on file  Transportation Needs: Not on file  Physical Activity: Not on file  Stress: Not on file  Social Connections: Not on file  Intimate Partner Violence: Not on file     ROS- All systems are reviewed and negative except as per the HPI above.  Physical Exam: Vitals:   08/11/21 1325  BP: 108/70  Pulse: 69  Weight: 81 kg  Height: 5\' 8"  (1.727 m)    GEN- The patient is a well appearing elderly  male, alert and oriented x 3 today.   Head- normocephalic, atraumatic Eyes-  Sclera clear, conjunctiva pink Ears- hearing intact Oropharynx- clear Neck- supple  Lungs- Clear to ausculation  bilaterally, normal work of breathing Heart- irregular rate and rhythm, no murmurs, rubs or gallops  GI- soft, NT, ND, + BS Extremities- no clubbing, cyanosis, or edema MS- no significant deformity or atrophy Skin- no rash or lesion Psych- euthymic mood, full affect Neuro- strength and sensation are intact  Wt Readings from Last 3 Encounters:  08/11/21 81 kg  07/28/21 78.5 kg  07/15/21 82.1 kg    EKG today demonstrates  Afib, PVC Vent. rate 69 BPM PR interval * ms QRS duration 130 ms QT/QTcB 420/450 ms  Echo 03/19/21 demonstrated   1. Left ventricular ejection fraction, by estimation, is 60 to 65%. The left ventricle has normal function. The left ventricle has no regional wall motion abnormalities. There is mild left ventricular hypertrophy. Left ventricular diastolic parameters are indeterminate.   2. Right ventricular systolic function is normal. The right ventricular  size is normal.   3. The mitral valve is normal in structure. Mild mitral valve  regurgitation. No evidence of mitral stenosis.   4. The aortic valve is calcified. Aortic valve regurgitation is trivial.  Mild to moderate aortic valve sclerosis/calcification is present, without any evidence of aortic stenosis.   5. The inferior vena cava is normal in size with greater than 50%  respiratory variability, suggesting right atrial pressure of 3 mmHg.   Epic records are reviewed at length today  CHA2DS2-VASc Score = 6  The patient's score is based upon: CHF History: 0 HTN History: 1 Diabetes History: 0 Stroke History: 2 Vascular Disease History: 1 Age Score: 2 Gender Score: 0      ASSESSMENT AND PLAN: 1. Persistent Atrial Fibrillation/atrial flutter The patient's CHA2DS2-VASc score is 6, indicating a 9.7% annual risk of stroke.   Patient in rate controlled afib, overall asymptomatic. Will not pursue rhythm control right now given the need for lead revision. Continue Eliquis 5 mg BID  2. Secondary  Hypercoagulable State (ICD10:  D68.69) The patient is at significant risk for stroke/thromboembolism based upon his CHA2DS2-VASc Score of 6.  Continue Apixaban (Eliquis).   3. 2nd degree AV block S/p PPM, plans for lead revision per Dr Curt Bears.   Follow up with Dr Curt Bears for lead revision.    [facility] [address] [place], [place] [address] [phone] 08/11/2021 2:12 PM

## 2021-08-11 NOTE — Patient Instructions (Signed)
   After Your Pacemaker   Monitor your pacemaker site for redness, swelling, and drainage. Call the device clinic at [phone] if you experience these symptoms or fever/chills.  You will be contacted by Dr. Curt Bears RN to schedule Pacemaker lead revision.   Your incision was closed with Steri-strips or staples:  You may shower 7 days after your procedure and wash your incision with soap and water. Avoid lotions, ointments, or perfumes over your incision until it is well-healed.  You may use a hot tub or a pool after your wound check appointment if the incision is completely closed.  Do not lift, push or pull greater than 10 pounds with the affected arm until 6 weeks after your procedure. There are no other restrictions in arm movement after your wound check appointment.  You may drive, unless driving has been restricted by your healthcare providers.  Your Pacemaker is not MRI compatible.  Remote monitoring is used to monitor your pacemaker from home. This monitoring is scheduled every 91 days by our office. It allows [place] to keep an eye on the functioning of your device to ensure it is working properly. You will routinely see your Electrophysiologist annually (more often if necessary).

## 2021-08-13 ENCOUNTER — Encounter: Payer: Self-pay | Admitting: *Deleted

## 2021-08-13 ENCOUNTER — Telehealth: Payer: Self-pay | Admitting: *Deleted

## 2021-08-13 DIAGNOSIS — R001 Bradycardia, unspecified: Secondary | ICD-10-CM

## 2021-08-13 DIAGNOSIS — Z01812 Encounter for preprocedural laboratory examination: Secondary | ICD-10-CM

## 2021-08-13 NOTE — Telephone Encounter (Signed)
Spoke to pt. Scheduled for PPM lead revision Monday, 11/21. Pt will stop by the [facility] office this afternoon to have CBC drawn. Aware I will send instructions via mychart. Patient verbalized understanding and agreeable to plan.

## 2021-08-14 LAB — CBC
Hematocrit: 44.1 % (ref 37.5–51.0)
Hemoglobin: 14.4 g/dL (ref 13.0–17.7)
MCH: 30.5 pg (ref 26.6–33.0)
MCHC: 32.7 g/dL (ref 31.5–35.7)
MCV: 93 fL (ref 79–97)
Platelets: 158 10*3/uL (ref 150–450)
RBC: 4.72 x10E6/uL (ref 4.14–5.80)
RDW: 12.9 % (ref 11.6–15.4)
WBC: 7.4 10*3/uL (ref 3.4–10.8)

## 2021-08-16 ENCOUNTER — Other Ambulatory Visit: Payer: Self-pay

## 2021-08-16 ENCOUNTER — Encounter (HOSPITAL_COMMUNITY)
Admission: RE | Disposition: A | Discharge status: Home / Self Care | Payer: Self-pay | Source: Ambulatory Visit | Attending: Cardiology

## 2021-08-16 ENCOUNTER — Ambulatory Visit (HOSPITAL_COMMUNITY)
Admission: RE | Admit: 2021-08-16 | Discharge: 2021-08-16 | Disposition: A | Discharge status: Home / Self Care | Payer: Medicare Other | Source: Ambulatory Visit | Attending: Cardiology | Admitting: Cardiology

## 2021-08-16 ENCOUNTER — Ambulatory Visit (HOSPITAL_COMMUNITY): Payer: Medicare Other

## 2021-08-16 DIAGNOSIS — Z8616 Personal history of COVID-19: Secondary | ICD-10-CM | POA: Insufficient documentation

## 2021-08-16 DIAGNOSIS — I129 Hypertensive chronic kidney disease with stage 1 through stage 4 chronic kidney disease, or unspecified chronic kidney disease: Secondary | ICD-10-CM | POA: Diagnosis not present

## 2021-08-16 DIAGNOSIS — R001 Bradycardia, unspecified: Secondary | ICD-10-CM | POA: Diagnosis present

## 2021-08-16 DIAGNOSIS — I459 Conduction disorder, unspecified: Secondary | ICD-10-CM

## 2021-08-16 DIAGNOSIS — Z95818 Presence of other cardiac implants and grafts: Secondary | ICD-10-CM

## 2021-08-16 DIAGNOSIS — I441 Atrioventricular block, second degree: Secondary | ICD-10-CM | POA: Diagnosis not present

## 2021-08-16 DIAGNOSIS — I2510 Atherosclerotic heart disease of native coronary artery without angina pectoris: Secondary | ICD-10-CM | POA: Diagnosis not present

## 2021-08-16 DIAGNOSIS — N189 Chronic kidney disease, unspecified: Secondary | ICD-10-CM | POA: Insufficient documentation

## 2021-08-16 DIAGNOSIS — T82110A Breakdown (mechanical) of cardiac electrode, initial encounter: Secondary | ICD-10-CM | POA: Diagnosis not present

## 2021-08-16 DIAGNOSIS — Z8673 Personal history of transient ischemic attack (TIA), and cerebral infarction without residual deficits: Secondary | ICD-10-CM | POA: Insufficient documentation

## 2021-08-16 HISTORY — PX: LEAD REVISION/REPAIR: EP1213

## 2021-08-16 SURGERY — LEAD REVISION/REPAIR

## 2021-08-16 MED ORDER — LIDOCAINE HCL (PF) 1 % IJ SOLN
INTRAMUSCULAR | Status: DC | PRN
  Administered 2021-08-16: 60 mL

## 2021-08-16 MED ORDER — SODIUM CHLORIDE 0.9 % IV SOLN: INTRAVENOUS | Status: DC

## 2021-08-16 MED ORDER — SODIUM CHLORIDE 0.9 % IV SOLN
80.0000 mg | INTRAVENOUS | Status: AC
  Administered 2021-08-16: 80 mg

## 2021-08-16 MED ORDER — ONDANSETRON HCL 4 MG/2ML IJ SOLN: 4.0000 mg | Freq: Four times a day (QID) | INTRAMUSCULAR | Status: DC | PRN

## 2021-08-16 MED ORDER — LIDOCAINE HCL (PF) 1 % IJ SOLN
INTRAMUSCULAR | Status: AC
  Filled 2021-08-16: qty 30

## 2021-08-16 MED ORDER — HEPARIN (PORCINE) IN NACL 1000-0.9 UT/500ML-% IV SOLN
INTRAVENOUS | Status: AC
  Filled 2021-08-16: qty 500

## 2021-08-16 MED ORDER — CHLORHEXIDINE GLUCONATE 4 % EX LIQD: 60.0000 mL | Freq: Once | CUTANEOUS | Status: DC

## 2021-08-16 MED ORDER — HEPARIN (PORCINE) IN NACL 1000-0.9 UT/500ML-% IV SOLN
INTRAVENOUS | Status: DC | PRN
  Administered 2021-08-16: 500 mL

## 2021-08-16 MED ORDER — CEFAZOLIN SODIUM-DEXTROSE 1-4 GM/50ML-% IV SOLN
1.0000 g | Freq: Four times a day (QID) | INTRAVENOUS | Status: DC
  Administered 2021-08-16: 1 g via INTRAVENOUS
  Filled 2021-08-16: qty 50

## 2021-08-16 MED ORDER — MIDAZOLAM HCL 5 MG/5ML IJ SOLN
INTRAMUSCULAR | Status: AC
  Filled 2021-08-16: qty 5

## 2021-08-16 MED ORDER — SODIUM CHLORIDE 0.9 % IV SOLN
INTRAVENOUS | Status: AC
  Filled 2021-08-16: qty 2

## 2021-08-16 MED ORDER — CEFAZOLIN SODIUM-DEXTROSE 2-4 GM/100ML-% IV SOLN
INTRAVENOUS | Status: AC
  Filled 2021-08-16: qty 100

## 2021-08-16 MED ORDER — ACETAMINOPHEN 325 MG PO TABS: 325.0000 mg | ORAL_TABLET | ORAL | Status: DC | PRN

## 2021-08-16 MED ORDER — MIDAZOLAM HCL 5 MG/5ML IJ SOLN
INTRAMUSCULAR | Status: DC | PRN
  Administered 2021-08-16 (×2): 1 mg via INTRAVENOUS

## 2021-08-16 MED ORDER — CEFAZOLIN SODIUM-DEXTROSE 2-4 GM/100ML-% IV SOLN
2.0000 g | INTRAVENOUS | Status: AC
  Administered 2021-08-16: 2 g via INTRAVENOUS

## 2021-08-16 MED ORDER — FENTANYL CITRATE (PF) 100 MCG/2ML IJ SOLN
INTRAMUSCULAR | Status: DC | PRN
  Administered 2021-08-16 (×2): 25 ug via INTRAVENOUS

## 2021-08-16 MED ORDER — FENTANYL CITRATE (PF) 100 MCG/2ML IJ SOLN
INTRAMUSCULAR | Status: AC
  Filled 2021-08-16: qty 2

## 2021-08-16 SURGICAL SUPPLY — 10 items
CABLE SURGICAL S-101-97-12 (CABLE) ×2 IMPLANT
CATH RIGHTSITE C315HIS02 (CATHETERS) ×2 IMPLANT
LEAD SELECT SECURE 3830 383069 (Lead) ×1 IMPLANT
PAD DEFIB RADIO PHYSIO CONN (PAD) ×2 IMPLANT
POUCH AIGIS-R ANTIBACT PPM (Mesh General) ×2 IMPLANT
SELECT SECURE 3830 383069 (Lead) ×2 IMPLANT
SHEATH 9FR PRELUDE SNAP 13 (SHEATH) ×2 IMPLANT
SLITTER 6232ADJ (MISCELLANEOUS) ×2 IMPLANT
TRAY PACEMAKER INSERTION (PACKS) ×2 IMPLANT
WIRE HI TORQ VERSACORE-J 145CM (WIRE) ×2 IMPLANT

## 2021-08-16 NOTE — Interval H&P Note (Signed)
History and Physical Interval Note:  08/16/2021 10:00 AM  Louis Barton  has presented today for surgery, with the diagnosis of heart block.  The various methods of treatment have been discussed with the patient and family. After consideration of risks, benefits and other options for treatment, the patient has consented to  Procedure(s): LEAD REVISION/REPAIR (N/A) as a surgical intervention.  The patient's history has been reviewed, patient examined, no change in status, stable for surgery.  I have reviewed the patient's chart and labs.  Questions were answered to the patient's satisfaction.     [name] [employer]

## 2021-08-16 NOTE — Discharge Instructions (Signed)
    Supplemental Discharge Instructions for  Pacemaker/Defibrillator Patients  Tomorrow, 08/17/21, send in a device transmission  Activity No heavy lifting or vigorous activity with your left/right arm for 6 to 8 weeks.  Do not raise your left/right arm above your head for one week.  Gradually raise your affected arm as drawn below.             08/21/21                   08/22/21                   08/23/21                 08/24/21 __  NO DRIVING until cleared to at your wound check visit  [place] the wound area clean and dry.  Do not get this area wet , no showers until cleared to at your wound check visit. Tomorrow, 08/17/21, remove the arm sling  Tomorrow, 08/17/21 remove the outer LARGE plastic bandage.  Underneath the plastic bandage there are steri strips (paper tapes), DO NOT remove these. The tape/steri-strips on your wound will fall off; do not pull them off.  No bandage is needed on the site.  DO  NOT apply any creams, oils, or ointments to the wound area. If you notice any drainage or discharge from the wound, any swelling or bruising at the site, or you develop a fever > 101? F after you are discharged home, call the office at once.  Special Instructions You are still able to use cellular telephones; use the ear opposite the side where you have your pacemaker/defibrillator.  Avoid carrying your cellular phone near your device. When traveling through airports, show security personnel your identification card to avoid being screened in the metal detectors.  Ask the security personnel to use the hand wand. Avoid arc welding equipment, MRI testing (magnetic resonance imaging), TENS units (transcutaneous nerve stimulators).  Call the office for questions about other devices. Avoid electrical appliances that are in poor condition or are not properly grounded. Microwave ovens are safe to be near or to operate.

## 2021-08-17 ENCOUNTER — Encounter (HOSPITAL_COMMUNITY): Payer: Self-pay | Admitting: Cardiology

## 2021-08-17 ENCOUNTER — Other Ambulatory Visit: Payer: Self-pay | Admitting: Oncology

## 2021-08-17 ENCOUNTER — Telehealth: Payer: Self-pay | Admitting: Cardiology

## 2021-08-17 DIAGNOSIS — C3492 Malignant neoplasm of unspecified part of left bronchus or lung: Secondary | ICD-10-CM

## 2021-08-17 NOTE — Telephone Encounter (Signed)
Pt c/o of Chest Pain: STAT if CP now or developed within 24 hours  1. Are you having CP right now? No   2. Are you experiencing any other symptoms (ex. SOB, nausea, vomiting, sweating)? Painful wound last night, pain has lightened since this morning   3. How long have you been experiencing CP? Started during procedure yesterday, woke up with pain at 2-3 AM  4. Is your CP continuous or coming and going? Continuous last night   5. Have you taken Nitroglycerin? No    CP woke him up last night around 2-3 AM and was continuous until passing. States he also had this pain during his procedure. He had severe wound pain last night as well that has since lightened up. Requested this information be sent to Dr. Curt Bears to make him aware. Reports this call was being made for notation purposes.  ?

## 2021-08-17 NOTE — Progress Notes (Signed)
Patient stated pain last knight was severe went up into his throat. Pain now 5/10. Instructed patient to notify Dr. Curt Bears office.

## 2021-08-17 NOTE — Telephone Encounter (Signed)
Pt reports the pain he had last night was in the wound but it was 5-6X greater than the pain he had when he had original device implant.   Reviewed episode with him from last night.  He states he was awakened by severe pain in incision.  The pain made it hard to take a deep breath.    Manual transmission received last night at 9:30pm, shows: MDT alert AT/AF >= 24hrs for 8 days.  Presenting today regular AP/BP 11/21 atrial bipolar lead warning, impedance 437 ohms, pt. underwent RV lead revision 11/21 AF burden 65.7%, Eliquis prescribed.  11/16 ov note stated "will not pursue rhythm control until lead revised". 1 NSVT, 12 beats? pt. was in recovery at this time.  Pt will send updated transmission for review.   He currently feels well and denies any cardiac symptoms outside of mild wound pain which at current time is about 1-2/10 on pain scale.  He has not needed any tylenol today.    Advised pt I would call back after transmission received.

## 2021-08-17 NOTE — Telephone Encounter (Signed)
Manual transmission received.  No arrythmia occurring last night at 2am.   Pt is currently in AF but this is a known issue.   Follow-up after same day discharge: Implant date: lead revision 08/16/21 MD: Allegra Lai, MD Device: [employer]onic PPM Location: Left chest   Wound check visit: 08/26/21 90 day MD follow-up: 11/23/21  Remote Transmission received:yes- see notes   Dressing removed: not yet, pt educated to remove bandage tonight.

## 2021-08-18 ENCOUNTER — Other Ambulatory Visit: Payer: Self-pay | Admitting: Hematology and Oncology

## 2021-08-18 ENCOUNTER — Inpatient Hospital Stay: Payer: Medicare Other

## 2021-08-18 ENCOUNTER — Encounter: Payer: Self-pay | Admitting: Oncology

## 2021-08-18 ENCOUNTER — Other Ambulatory Visit: Payer: Self-pay | Admitting: Oncology

## 2021-08-18 ENCOUNTER — Inpatient Hospital Stay: Payer: Medicare Other | Attending: Oncology | Admitting: Oncology

## 2021-08-18 VITALS — BP 116/62 | HR 69 | Temp 97.7°F | Resp 20 | Ht 68.0 in | Wt 181.5 lb

## 2021-08-18 DIAGNOSIS — C3490 Malignant neoplasm of unspecified part of unspecified bronchus or lung: Secondary | ICD-10-CM

## 2021-08-18 DIAGNOSIS — R978 Other abnormal tumor markers: Secondary | ICD-10-CM | POA: Diagnosis not present

## 2021-08-18 DIAGNOSIS — Z85118 Personal history of other malignant neoplasm of bronchus and lung: Secondary | ICD-10-CM | POA: Insufficient documentation

## 2021-08-18 DIAGNOSIS — R970 Elevated carcinoembryonic antigen [CEA]: Secondary | ICD-10-CM | POA: Insufficient documentation

## 2021-08-18 DIAGNOSIS — C3492 Malignant neoplasm of unspecified part of left bronchus or lung: Secondary | ICD-10-CM

## 2021-08-18 DIAGNOSIS — N281 Cyst of kidney, acquired: Secondary | ICD-10-CM | POA: Insufficient documentation

## 2021-08-18 LAB — BASIC METABOLIC PANEL
BUN: 14 (ref 4–21)
CO2: 24 — AB (ref 13–22)
Chloride: 108 (ref 99–108)
Creatinine: 0.9 (ref 0.6–1.3)
Glucose: 113
Potassium: 3.7 (ref 3.4–5.3)
Sodium: 138 (ref 137–147)

## 2021-08-18 LAB — COMPREHENSIVE METABOLIC PANEL
Albumin: 3.5 (ref 3.5–5.0)
Calcium: 8.5 — AB (ref 8.7–10.7)

## 2021-08-18 LAB — HEPATIC FUNCTION PANEL
ALT: 15 (ref 10–40)
AST: 23 (ref 14–40)
Alkaline Phosphatase: 43 (ref 25–125)
Bilirubin, Total: 1

## 2021-08-19 LAB — CEA: CEA: 10.2 ng/mL — ABNORMAL HIGH (ref 0.0–4.7)

## 2021-08-23 ENCOUNTER — Telehealth: Payer: Self-pay

## 2021-08-23 NOTE — Telephone Encounter (Signed)
-----   Message from Derwood Kaplan, MD sent at 08/23/2021  7:32 AM EST ----- Regarding: call Tell him CEA is better at 10.2, will continue to monitor

## 2021-08-23 NOTE — Telephone Encounter (Signed)
Patient notified

## 2021-08-26 ENCOUNTER — Other Ambulatory Visit: Payer: Self-pay

## 2021-08-26 ENCOUNTER — Ambulatory Visit (INDEPENDENT_AMBULATORY_CARE_PROVIDER_SITE_OTHER): Payer: Medicare Other

## 2021-08-26 DIAGNOSIS — R001 Bradycardia, unspecified: Secondary | ICD-10-CM | POA: Diagnosis not present

## 2021-08-26 LAB — CUP PACEART INCLINIC DEVICE CHECK
Battery Remaining Longevity: 154 mo
Battery Voltage: 3.2 V
Brady Statistic AP VP Percent: 69.75 %
Brady Statistic AP VS Percent: 0.14 %
Brady Statistic AS VP Percent: 29.72 %
Brady Statistic AS VS Percent: 0.42 %
Brady Statistic RA Percent Paced: 44.16 %
Brady Statistic RV Percent Paced: 83.62 %
Date Time Interrogation Session: 20221201160251
Implantable Lead Implant Date: 20221102
Implantable Lead Implant Date: 20221121
Implantable Lead Location: 753859
Implantable Lead Location: 753860
Implantable Lead Model: 3830
Implantable Lead Model: 5076
Implantable Pulse Generator Implant Date: 20221102
Lead Channel Impedance Value: 304 Ohm
Lead Channel Impedance Value: 361 Ohm
Lead Channel Impedance Value: 475 Ohm
Lead Channel Impedance Value: 494 Ohm
Lead Channel Pacing Threshold Amplitude: 0.75 V
Lead Channel Pacing Threshold Pulse Width: 0.4 ms
Lead Channel Sensing Intrinsic Amplitude: 1.5 mV
Lead Channel Sensing Intrinsic Amplitude: 10 mV
Lead Channel Setting Pacing Amplitude: 3.5 V
Lead Channel Setting Pacing Amplitude: 3.5 V
Lead Channel Setting Pacing Pulse Width: 0.4 ms
Lead Channel Setting Sensing Sensitivity: 0.9 mV

## 2021-08-26 NOTE — Progress Notes (Signed)
Wound check appointment s/p lead revision. Steri-strips removed. Wound without redness or edema. Incision edges approximated, wound well healed. Normal device function. Pt currently in AF since ~ 9pm last night.  Known history of PAF, overall V rates controlled by pacing.  Thresholds, sensing, and impedances consistent with implant measurements. Device programmed at 3.5V for extra safety margin until 3 month visit. Histogram distribution appropriate for patient and level of activity. AT/AF Burden 37.3%, +OAC. Patient AF managed in AF clinic, currently following rate control strategy.  No high ventricular rates noted. Patient educated about wound care, arm mobility, lifting restrictions. ROV with Dr. Curt Bears on 09/22/22.  Patient is enrolled in remote monitoring, next scheduled check 11/16/21.

## 2021-08-26 NOTE — Patient Instructions (Signed)

## 2021-10-07 ENCOUNTER — Ambulatory Visit: Payer: Medicare Other | Admitting: Cardiology

## 2021-10-19 ENCOUNTER — Encounter: Payer: Self-pay | Admitting: Cardiology

## 2021-10-19 ENCOUNTER — Other Ambulatory Visit: Payer: Self-pay

## 2021-10-19 ENCOUNTER — Ambulatory Visit (INDEPENDENT_AMBULATORY_CARE_PROVIDER_SITE_OTHER): Payer: Medicare Other | Admitting: Cardiology

## 2021-10-19 VITALS — BP 118/68 | HR 65 | Ht 69.0 in | Wt 183.6 lb

## 2021-10-19 DIAGNOSIS — Z950 Presence of cardiac pacemaker: Secondary | ICD-10-CM

## 2021-10-19 DIAGNOSIS — R0789 Other chest pain: Secondary | ICD-10-CM | POA: Diagnosis not present

## 2021-10-19 DIAGNOSIS — I739 Peripheral vascular disease, unspecified: Secondary | ICD-10-CM | POA: Diagnosis not present

## 2021-10-19 DIAGNOSIS — I2510 Atherosclerotic heart disease of native coronary artery without angina pectoris: Secondary | ICD-10-CM | POA: Diagnosis not present

## 2021-10-19 HISTORY — DX: Presence of cardiac pacemaker: Z95.0

## 2021-10-19 NOTE — Progress Notes (Signed)
Cardiology Office Note:    Date:  10/19/2021   ID:  Louis Barton, DOB 1941/03/29, MRN [id number]  PCP:  Raina Mina., MD  Cardiologist:  Jenne Campus, MD    Referring MD: Raina Mina., MD   Chief Complaint  Patient presents with   Medication Management   Shortness of Breath   coughing   Chest Pain    Ongoing for 2-3 weeks     History of Present Illness:    Louis Barton is a 81 y.o. male   with quite complex past medical history.  That include coronary artery disease, mild nonobstructive, cardiac catheterization done in May 2022 showed 20% mid and distal circumflex as well as 20% of mid LAD lesion.  Also history of essential hypertension first-degree AV block, status post bilateral carotic endarterectomy, non-small cell lung cancer status post wedge resection and right lower lobe, remote history of TIA.  Recently he ended up being in [facility] because of episode of atrial flutter with slow ventricular rate.  He did have TEE done and then eventually electrical cardioversion.  Few months ago he came to hospital with confusion and suspicion for CVA/TIA has been raised however future evaluation showed found that he was positive for COVID-19.  Luckily recovered quite nicely.  While he was in the hospital he was bradycardic with 2-1 AV conduction.  He was eventually referred to EP team for consideration of pacemaker implantation he was scheduled to have it done however postpone it initially want to have it done at the beginning of January but now he is scheduled for November 30. Is coming today 2 months for follow-up.  Still not feeling well he does have a lot of complaint he complains of failure to get a lot of sputum when he coughs.  He also complained when he leans forward he is getting short of breath.  But overall seems to be getting strength back.  Sadly interrogation of his device showed repeated episode of atrial fibrillation.    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 signficant DDD present with vacuum effect L4-5 and L5-S1 flat discs and anterior spurring noted   Dizziness 05/06/2020   Dyslipidemia 03/18/2015   Dyspnea on exertion 04/02/2020   Ear itch 05/14/2018   Edema 11/16/2015   Edema of both lower extremities due to peripheral venous insufficiency 02/03/2020   First degree AV block 10/20/2020   History of hiatal hernia    seen on last scan- as slight    History of thrombocytopenia    History of TIAs  Hyperlipidemia    Hypertension    Irritable bowel syndrome 11/17/2016   rec trial of citrucel/ diet 11/16/2016 >>>      Local edema 02/07/2018   Malaise and fatigue 12/08/2020   Malignant neoplasm of left lung ([place]) 12/08/2017   Stage 1A. [place]. And Dr. 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 Renal parapelvic cysts 10/06/16 on CT , right  kidney midpole 1.2 cm, right kidney anterior pole 1.1 cm, and to the left kidney lower pole anterior position 3.5cm all felt to be benign   Right bundle branch block 10/20/2020   Right lower lobe pulmonary nodule 11/09/2017   Shingles    internal- obstruction of bowels & gallbladder   Solitary pulmonary nodule 11/16/2016   CT ABd 03/24/09 linerar densities in RLL and RML c/w scarring o/w clear CT Abd 10/20/16 c/w 7 mm nodule  - CT chest 02/13/2017 :  slt enlarged to 9 mm   - PET 04/04/17 :1. These slowly enlarging 9 mm right lower lobe pulmonary nodule along the right hemidiaphragm does not appear hypermetabolic today. Location adjacent to the hemidiaphragm can cause false negatives due to motion artifact related to the   Sprain of left ankle 05/21/2021   Stage 3a chronic kidney disease ([place]) 06/17/2020   Stroke ([place])    Swelling of limb-Left foot 01/01/2015   Vitamin B12 deficiency 06/21/2017    Past Surgical History:  Procedure Laterality Date   adenocarcinoma  2019   Removed   CARDIOVERSION N/A 03/22/2021   Procedure: CARDIOVERSION;  Surgeon: Lelon Perla, MD;  Location: [place];  Service: Cardiovascular;  Laterality: N/A;   CAROTID ENDARTERECTOMY  2005   Left carotid by Dr. Oneida Alar   CAROTID ENDARTERECTOMY  04/25/11   Right Carotid by Dr. Oneida Alar   COLONOSCOPY  02/23/2017   Colonic polyp status post polypectomy. Pancolonic diverticulosis predominatly in the sigmoid colon. Tubular adenoma   LEAD REVISION/REPAIR N/A 08/16/2021   Procedure: LEAD REVISION/REPAIR;  Surgeon: Constance Haw, MD;  Location: [place] [facility];  Service: Cardiovascular;  Laterality: N/A;   LEFT HEART CATH AND CORONARY ANGIOGRAPHY N/A 02/08/2021   Procedure: LEFT HEART CATH AND CORONARY ANGIOGRAPHY;  Surgeon: Burnell Blanks, MD;  Location: [place] [facility];  Service: Cardiovascular;  Laterality: N/A;   PACEMAKER IMPLANT N/A 07/28/2021   Procedure: PACEMAKER IMPLANT;  Surgeon: Constance Haw,  MD;  Location: [place] [facility];  Service: Cardiovascular;  Laterality: N/A;   Septoplasty, nasal w/ submucosal resection  1960   SQUAMOUS CELL CARCINOMA EXCISION     pt said numerous times   TEE WITHOUT CARDIOVERSION N/A 03/22/2021   Procedure: TRANSESOPHAGEAL ECHOCARDIOGRAM (TEE);  Surgeon: Lelon Perla, MD;  Location: [place];  Service: Cardiovascular;  Laterality: N/A;   TONSILLECTOMY     VIDEO ASSISTED THORACOSCOPY (VATS)/WEDGE RESECTION Right 11/09/2017   Procedure: RIGHT VIDEO ASSISTED THORACOSCOPY WITH Chales Salmon RESECTION;  Surgeon: [name], MD;  Location: [place];  Service: Thoracic;  Laterality: Right;    Current Medications: Current Meds  Medication Sig   acetaminophen (TYLENOL) 500 MG tablet Take 1,000 mg by mouth every 8 (eight) hours as needed for mild pain or moderate pain (for pain.).  amLODipine (NORVASC) 2.5 MG tablet Take 2.5 mg by mouth daily.   apixaban (ELIQUIS) 5 MG TABS tablet Take 1 tablet (5 mg total) by mouth 2 (two) times daily. Resume 11/3 with the evening dose.   Cholecalciferol (VITAMIN D3) 50 MCG (2000 UT) TABS Take 2,000 Units by mouth every other day.   colesevelam (WELCHOL) 625 MG tablet Take 1,250 mg by mouth 2 (two) times daily with a meal.   furosemide (LASIX) 40 MG tablet Take 0.5 tablets (20 mg total) by mouth daily.   Polyethyl Glycol-Propyl Glycol (SYSTANE ULTRA OP) Place 1 drop into both eyes daily. Unknown strength   potassium chloride (KLOR-CON) 10 MEQ tablet Take 20 mEq by mouth 2 (two) times daily.   rosuvastatin (CRESTOR) 40 MG tablet Take 1 tablet (40 mg total) by mouth at bedtime.   valACYclovir (VALTREX) 1000 MG tablet Take 1,000 mg by mouth 2 (two) times daily as needed (for fever blisters.).    zinc gluconate 50 MG tablet Take 50 mg by mouth 2 (two) times a week.     Allergies:   Patient has no allergy information on record.   Social History   Socioeconomic History   Marital status: Married    Spouse name: Not  on file   Number of children: 2   Years of education: Not on file   Highest education level: Not on file  Occupational History   Not on file  Tobacco Use   Smoking status: Former    Packs/day: 1.00    Years: 16.00    Pack years: 16.00    Types: Cigarettes    Quit date: 09/26/1972    Years since quitting: 49.0   Smokeless tobacco: Never   Tobacco comments:    Former smoker 08/11/21  Vaping Use   Vaping Use: Never used  Substance and Sexual Activity   Alcohol use: Not Currently    Alcohol/week: 7.0 standard drinks    Types: 7 Shots of liquor per week    Comment: 1 shot daily 08/11/21   Drug use: No   Sexual activity: Not on file  Other Topics Concern   Not on file  Social History Narrative   Not on file   Social Determinants of Health   Financial Resource Strain: Not on file  Food Insecurity: Not on file  Transportation Needs: Not on file  Physical Activity: Not on file  Stress: Not on file  Social Connections: Not on file     Family History: The patient's family history includes Cancer in his brother and sister; Cancer (age of onset: 35) in his mother; Deep vein thrombosis in his mother; Diabetes in his father and mother; Heart attack in his father and mother; Heart disease in his mother; Hyperlipidemia in his mother; Hypertension in his mother; Other in his mother and sister; Stroke in his mother; Varicose Veins in his mother. There is no history of Colon cancer or Esophageal cancer. ROS:   Please see the history of present illness.    All 14 point review of systems negative except as described per history of present illness  EKGs/Labs/Other Studies Reviewed:      Recent Labs: 03/18/2021: B Natriuretic Peptide 187.1 05/05/2021: Magnesium 1.7; TSH 0.946 08/13/2021: Hemoglobin 14.4; Platelets 158 08/18/2021: ALT 15; BUN 14; Creatinine 0.9; Potassium 3.7; Sodium 138  Recent Lipid Panel    Component Value Date/Time   CHOL 159 11/21/2018 0836   TRIG 82 11/21/2018  0836   HDL 59 11/21/2018 0836   CHOLHDL  2.7 11/21/2018 0836   LDLCALC 84 11/21/2018 0836    Physical Exam:    VS:  BP 118/68 (BP Location: Left Arm, Patient Position: Sitting)    Pulse 65    Ht 5\' 9"  (1.753 m)    Wt 183 lb 9.6 oz (83.3 kg)    SpO2 94%    BMI 27.11 kg/m     Wt Readings from Last 3 Encounters:  10/19/21 183 lb 9.6 oz (83.3 kg)  08/18/21 181 lb 8 oz (82.3 kg)  08/16/21 175 lb (79.4 kg)     GEN:  Well nourished, well developed in no acute distress HEENT: Normal NECK: No JVD; No carotid bruits LYMPHATICS: No lymphadenopathy CARDIAC: RRR, no murmurs, no rubs, no gallops RESPIRATORY:  Clear to auscultation without rales, wheezing or rhonchi  ABDOMEN: Soft, non-tender, non-distended MUSCULOSKELETAL:  No edema; No deformity  SKIN: Warm and dry LOWER EXTREMITIES: no swelling NEUROLOGIC:  Alert and oriented x 3 PSYCHIATRIC:  Normal affect   ASSESSMENT:    1. Arteriosclerosis of coronary artery   2. PAD (peripheral artery disease) ([place])   3. Pacemaker   4. Atypical chest pain    PLAN:    In order of problems listed above:  Coronary artery disease cardiac catheterization reviewed showing nonobstructive disease continue risk factors modifications. Peripheral arterial disease stable from that point review. Pacemaker present recently implant.  UTD required revision of the ventricular lead now things are looking good. Atypical chest pain denies having any. Paroxysmal atrial fibrillation.  He is anticoagulant Eliquis which I will continue.  I will send a message to my EP colleague with the question what antiarrhythmic patient to initiate which on my opinion we should.  His ejection fraction is normal he does not have an obstructive coronary artery disease therefore we got multiple choices for antiarrhythmic   Medication Adjustments/Labs and Tests Ordered: Current medicines are reviewed at length with the patient today.  Concerns regarding medicines are outlined above.   No orders of the defined types were placed in this encounter.  Medication changes: No orders of the defined types were placed in this encounter.   Signed, Park Liter, MD, [facility] 10/19/2021 3:36 PM    [place]

## 2021-10-19 NOTE — Patient Instructions (Signed)
Medication Instructions:  Your physician recommends that you continue on your current medications as directed. Please refer to the Current Medication list given to you today.  *If you need a refill on your cardiac medications before your next appointment, please call your pharmacy*   Lab Work: None If you have labs (blood work) drawn today and your tests are completely normal, you will receive your results only by: [place] (if you have MyChart) OR A paper copy in the mail If you have any lab test that is abnormal or we need to change your treatment, we will call you to review the results.   Testing/Procedures: None   Follow-Up: At [facility], you and your health needs are our priority.  As part of our continuing mission to provide you with exceptional heart care, we have created designated Provider Care Teams.  These Care Teams include your primary Cardiologist (physician) and Advanced Practice Providers (APPs -  Physician Assistants and Nurse Practitioners) who all work together to provide you with the care you need, when you need it.  We recommend signing up for the patient portal called "MyChart".  Sign up information is provided on this After Visit Summary.  MyChart is used to connect with patients for Virtual Visits (Telemedicine).  Patients are able to view lab/test results, encounter notes, upcoming appointments, etc.  Non-urgent messages can be sent to your provider as well.   To learn more about what you can do with MyChart, go to [email or web address].    Your next appointment:   3 month(s)  The format for your next appointment:   In Person  Provider:   Jenne Campus, MD    Other Instructions None

## 2021-10-27 ENCOUNTER — Other Ambulatory Visit: Payer: Self-pay

## 2021-10-27 MED ORDER — AMIODARONE HCL 200 MG PO TABS: 200.0000 mg | ORAL_TABLET | Freq: Two times a day (BID) | ORAL | 3 refills | Status: DC

## 2021-10-28 ENCOUNTER — Ambulatory Visit (INDEPENDENT_AMBULATORY_CARE_PROVIDER_SITE_OTHER): Payer: Medicare Other

## 2021-10-28 DIAGNOSIS — I440 Atrioventricular block, first degree: Secondary | ICD-10-CM | POA: Diagnosis not present

## 2021-10-28 LAB — CUP PACEART REMOTE DEVICE CHECK
Battery Remaining Longevity: 145 mo
Battery Voltage: 3.17 V
Brady Statistic AP VP Percent: 74.24 %
Brady Statistic AP VS Percent: 0.66 %
Brady Statistic AS VP Percent: 24.07 %
Brady Statistic AS VS Percent: 0.98 %
Brady Statistic RA Percent Paced: 41.01 %
Brady Statistic RV Percent Paced: 97.61 %
Date Time Interrogation Session: 20230201214734
Implantable Lead Implant Date: 20221102
Implantable Lead Implant Date: 20221121
Implantable Lead Location: 753859
Implantable Lead Location: 753860
Implantable Lead Model: 3830
Implantable Lead Model: 5076
Implantable Pulse Generator Implant Date: 20221102
Lead Channel Impedance Value: 285 Ohm
Lead Channel Impedance Value: 342 Ohm
Lead Channel Impedance Value: 475 Ohm
Lead Channel Impedance Value: 494 Ohm
Lead Channel Pacing Threshold Amplitude: 0.625 V
Lead Channel Pacing Threshold Amplitude: 0.75 V
Lead Channel Pacing Threshold Pulse Width: 0.4 ms
Lead Channel Pacing Threshold Pulse Width: 0.4 ms
Lead Channel Sensing Intrinsic Amplitude: 1.375 mV
Lead Channel Sensing Intrinsic Amplitude: 1.375 mV
Lead Channel Sensing Intrinsic Amplitude: 9.375 mV
Lead Channel Sensing Intrinsic Amplitude: 9.375 mV
Lead Channel Setting Pacing Amplitude: 2 V
Lead Channel Setting Pacing Amplitude: 2.5 V
Lead Channel Setting Pacing Pulse Width: 0.4 ms
Lead Channel Setting Sensing Sensitivity: 0.9 mV

## 2021-11-03 NOTE — Progress Notes (Signed)
Remote pacemaker transmission.   

## 2021-11-23 ENCOUNTER — Encounter: Payer: Self-pay | Admitting: Cardiology

## 2021-11-23 ENCOUNTER — Ambulatory Visit (INDEPENDENT_AMBULATORY_CARE_PROVIDER_SITE_OTHER): Payer: Medicare Other | Admitting: Cardiology

## 2021-11-23 ENCOUNTER — Other Ambulatory Visit: Payer: Self-pay

## 2021-11-23 ENCOUNTER — Encounter: Payer: Self-pay | Admitting: *Deleted

## 2021-11-23 VITALS — BP 128/70 | HR 86 | Ht 69.0 in | Wt 182.2 lb

## 2021-11-23 DIAGNOSIS — I2510 Atherosclerotic heart disease of native coronary artery without angina pectoris: Secondary | ICD-10-CM | POA: Diagnosis not present

## 2021-11-23 DIAGNOSIS — I483 Typical atrial flutter: Secondary | ICD-10-CM | POA: Diagnosis not present

## 2021-11-23 DIAGNOSIS — I459 Conduction disorder, unspecified: Secondary | ICD-10-CM

## 2021-11-23 DIAGNOSIS — R001 Bradycardia, unspecified: Secondary | ICD-10-CM | POA: Diagnosis not present

## 2021-11-23 NOTE — Progress Notes (Signed)
Electrophysiology Office Note   Date:  11/23/2021   ID:  WARNELL RASNIC, DOB Jan 05, 1941, MRN [id number]  PCP:  Louis Barton., MD  Cardiologist: Agustin Cree Primary Electrophysiologist:  Conya Ellinwood Meredith Leeds, MD    Chief Complaint: Fatigue   History of Present Illness: Louis Barton is a 81 y.o. male who is being seen today for the evaluation of second-degree heart block at the request of Louis Barton., MD. Presenting today for electrophysiology evaluation.  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  He presented to [facility] with atrial flutter and slow ventricular response.  He was hospitalized in 2020 with confusion.  He was noted to be in 2-1 AV block and was diagnosed with COVID.  He had continued to have episodes of 2-1 AV block and is now status post Medtronic dual-chamber pacemaker implanted 07/28/2021 with lead revision on 08/16/2021 due to loss of RV capture.  Today, denies symptoms of palpitations, chest pain, shortness of breath, orthopnea, PND, lower extremity edema, claudication, dizziness, presyncope, syncope, bleeding, or neurologic sequela. The patient is tolerating medications without difficulties.  He is in atrial flutter today.Louis Barton  He has symptoms of weakness and fatigue.  He like to get back into normal rhythm.   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 signficant DDD present with vacuum effect L4-5 and L5-S1 flat discs and anterior spurring noted   Dizziness 05/06/2020   Dyslipidemia 03/18/2015   Dyspnea on exertion 04/02/2020   Ear itch 05/14/2018   Edema 11/16/2015   Edema of both lower extremities due to peripheral venous insufficiency 02/03/2020   First degree AV block 10/20/2020   History of hiatal hernia    seen on last scan- as slight    History of thrombocytopenia    History of TIAs    Hyperlipidemia    Hypertension    Irritable bowel syndrome 11/17/2016   rec trial of citrucel/ diet 11/16/2016 >>>      Local edema 02/07/2018   Malaise and fatigue 12/08/2020   Malignant neoplasm of left lung (Louis Barton) 12/08/2017   Stage 1A. [place]. And Dr. 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 Renal parapelvic cysts 10/06/16 on  CT , right kidney midpole 1.2 cm, right kidney anterior pole 1.1 cm, and to the left kidney lower pole anterior position 3.5cm all felt to be benign   Right bundle branch block 10/20/2020   Right lower lobe pulmonary nodule 11/09/2017   Shingles    internal- obstruction of bowels & gallbladder   Solitary pulmonary nodule 11/16/2016   CT ABd 03/24/09 linerar densities in RLL and RML c/w scarring o/w clear CT Abd 10/20/16 c/w 7 mm nodule  - CT chest 02/13/2017 :  slt enlarged to 9 mm   - PET 04/04/17  :1. These slowly enlarging 9 mm right lower lobe pulmonary nodule along the right hemidiaphragm does not appear hypermetabolic today. Location adjacent to the hemidiaphragm can cause false negatives due to motion artifact related to the   Sprain of left ankle 05/21/2021   Stage 3a chronic kidney disease ([place]) 06/17/2020   Stroke (Louis Barton)    Swelling of limb-Left foot 01/01/2015   Vitamin B12 deficiency 06/21/2017   Past Surgical History:  Procedure Laterality Date   adenocarcinoma  2019   Removed   CARDIOVERSION N/A 03/22/2021   Procedure: CARDIOVERSION;  Surgeon: Lelon Perla, MD;  Location: [place];  Service: Cardiovascular;  Laterality: N/A;   CAROTID ENDARTERECTOMY  2005   Left carotid by Dr. Oneida Alar   CAROTID ENDARTERECTOMY  04/25/11   Right Carotid by Dr. Oneida Alar   COLONOSCOPY  02/23/2017   Colonic polyp status post polypectomy. Pancolonic diverticulosis predominatly in the sigmoid colon. Tubular adenoma   LEAD REVISION/REPAIR N/A 08/16/2021   Procedure: LEAD REVISION/REPAIR;  Surgeon: Constance Haw, MD;  Location: [place] [facility];  Service: Cardiovascular;  Laterality: N/A;   LEFT HEART CATH AND CORONARY ANGIOGRAPHY N/A 02/08/2021   Procedure: LEFT HEART CATH AND CORONARY ANGIOGRAPHY;  Surgeon: Burnell Blanks, MD;  Location: [place] [facility];  Service: Cardiovascular;  Laterality: N/A;   PACEMAKER IMPLANT N/A 07/28/2021   Procedure: PACEMAKER IMPLANT;  Surgeon: Constance Haw, MD;  Location: [place] [facility];  Service: Cardiovascular;  Laterality: N/A;   Septoplasty, nasal w/ submucosal resection  1960   SQUAMOUS CELL CARCINOMA EXCISION     pt said numerous times   TEE WITHOUT CARDIOVERSION N/A 03/22/2021   Procedure: TRANSESOPHAGEAL ECHOCARDIOGRAM (TEE);  Surgeon: Lelon Perla, MD;  Location: [place];  Service: Cardiovascular;  Laterality: N/A;   TONSILLECTOMY     VIDEO ASSISTED THORACOSCOPY (VATS)/WEDGE RESECTION Right 11/09/2017   Procedure:  RIGHT VIDEO ASSISTED THORACOSCOPY WITH Chales Salmon RESECTION;  Surgeon: [name], MD;  Location: [place];  Service: Thoracic;  Laterality: Right;     Current Outpatient Medications  Medication Sig Dispense Refill   acetaminophen (TYLENOL) 500 MG tablet Take 1,000 mg by mouth every 8 (eight) hours as needed for mild pain or moderate pain (for pain.).     amiodarone (PACERONE) 200 MG tablet Take 1 tablet (200 mg total) by mouth 2 (two) times daily. Please take Amiodarone 200 mg twice daily for 2 weeks and then 200 mg daily 90 tablet 3   amLODipine (NORVASC) 2.5 MG tablet Take 2.5 mg by mouth daily.     apixaban (ELIQUIS) 5 MG TABS tablet Take 1 tablet (5 mg total) by mouth 2 (two) times daily. Resume 11/3 with the evening dose. 180 tablet 3   Cholecalciferol (VITAMIN D3) 50 MCG (2000 UT) TABS Take 2,000 Units by mouth every other day.     colesevelam (WELCHOL) 625 MG tablet Take 1,250 mg by  mouth 2 (two) times daily with a meal.     furosemide (LASIX) 20 MG tablet Take 20 mg by mouth daily. Pt sometimes takes one 40 mg tablet daily instead     furosemide (LASIX) 40 MG tablet Take 0.5 tablets (20 mg total) by mouth daily. (Patient taking differently: Take 40 mg by mouth daily. Pt takes one 20 mg tablet by mouth some days) 90 tablet 3   Polyethyl Glycol-Propyl Glycol (SYSTANE ULTRA OP) Place 1 drop into both eyes daily. Unknown strength     potassium chloride (KLOR-CON) 10 MEQ tablet Take 20 mEq by mouth 2 (two) times daily.     rosuvastatin (CRESTOR) 40 MG tablet Take 1 tablet (40 mg total) by mouth at bedtime.     valACYclovir (VALTREX) 1000 MG tablet Take 1,000 mg by mouth 2 (two) times daily as needed (for fever blisters.).      zinc gluconate 50 MG tablet Take 50 mg by mouth 2 (two) times a week.     No current facility-administered medications for this visit.    Allergies:   Patient has no allergy information on record.   Social History:  The patient  reports that he quit smoking  about 49 years ago. His smoking use included cigarettes. He has a 16.00 pack-year smoking history. He has never used smokeless tobacco. He reports that he does not currently use alcohol after a past usage of about 7.0 standard drinks per week. He reports that he does not use drugs.   Family History:  The patient's family history includes Cancer in his brother and sister; Cancer (age of onset: 11) in his mother; Deep vein thrombosis in his mother; Diabetes in his father and mother; Heart attack in his father and mother; Heart disease in his mother; Hyperlipidemia in his mother; Hypertension in his mother; Other in his mother and sister; Stroke in his mother; Varicose Veins in his mother.   ROS:  Please see the history of present illness.   Otherwise, review of systems is positive for none.   All other systems are reviewed and negative.   PHYSICAL EXAM: VS:  BP 128/70    Pulse 86    Ht 5\' 9"  (1.753 m)    Wt 182 lb 3.2 oz (82.6 kg)    SpO2 94%    BMI 26.91 kg/m  , BMI Body mass index is 26.91 kg/m. GEN: Well nourished, well developed, in no acute distress  HEENT: normal  Neck: no JVD, carotid bruits, or masses Cardiac: RRR; no murmurs, rubs, or gallops,no edema  Respiratory:  clear to auscultation bilaterally, normal work of breathing GI: soft, nontender, nondistended, + BS MS: no deformity or atrophy  Skin: warm and dry, device site well healed Neuro:  Strength and sensation are intact Psych: euthymic mood, full affect  EKG:  EKG is ordered today. Personal review of the ekg ordered shows atrial flutter, rate 86, ventricular paced  Personal review of the device interrogation today. Results in [place]: 03/18/2021: B Natriuretic Peptide 187.1 05/05/2021: Magnesium 1.7; TSH 0.946 08/13/2021: Hemoglobin 14.4; Platelets 158 08/18/2021: ALT 15; BUN 14; Creatinine 0.9; Potassium 3.7; Sodium 138    Lipid Panel     Component Value Date/Time   CHOL 159 11/21/2018 0836   TRIG 82  11/21/2018 0836   HDL 59 11/21/2018 0836   CHOLHDL 2.7 11/21/2018 0836   LDLCALC 84 11/21/2018 0836     Wt Readings from Last 3 Encounters:  11/23/21 182 lb 3.2  oz (82.6 kg)  10/19/21 183 lb 9.6 oz (83.3 kg)  08/18/21 181 lb 8 oz (82.3 kg)      Other studies Reviewed: Additional studies/ records that were reviewed today include: TTE 03/19/21  Review of the above records today demonstrates:   1. Left ventricular ejection fraction, by estimation, is 60 to 65%. The  left ventricle has normal function. The left ventricle has no regional  wall motion abnormalities. There is mild left ventricular hypertrophy.  Left ventricular diastolic parameters  are indeterminate.   2. Right ventricular systolic function is normal. The right ventricular  size is normal.   3. The mitral valve is normal in structure. Mild mitral valve  regurgitation. No evidence of mitral stenosis.   4. The aortic valve is calcified. Aortic valve regurgitation is trivial.  Mild to moderate aortic valve sclerosis/calcification is present, without  any evidence of aortic stenosis.   5. The inferior vena cava is normal in size with greater than 50%  respiratory variability, suggesting right atrial pressure of 3 mmHg.    ASSESSMENT AND PLAN:  1.  Second-degree AV block: Has had episodes of 2-1 AV block intermittently.  He is now status post Medtronic dual-chamber pacemaker implanted 07/28/2021.  Failure to capture with his 3830-lead and had lead revision on 08/16/2021.  Device functioning appropriately.  No changes at this time.  2.  Chronic diastolic heart failure: No obvious volume overload.  Plan per primary cardiology  3.  Typical atrial flutter: Currently on Eliquis.  Had slow ventricular response in 2-1 AV block.  CHA2DS2-VASc of at least 5.  He has been loaded on amiodarone.  High risk medication monitoring.  He has fatigue and would like to get back into normal rhythm.  Due to that, we Leighla Chestnutt plan for  cardioversion.   Current medicines are reviewed at length with the patient today.   The patient does not have concerns regarding his medicines.  The following changes were made today: None  Labs/ tests ordered today include:  Orders Placed This Encounter  Procedures   EKG 12-Lead     Disposition:   FU with Kaleb Sek 3 months  Signed, Alexey Rhoads Meredith Leeds, MD  11/23/2021 4:54 PM     [address] [place] [id number] [phone] (office) [phone] (fax)

## 2021-11-23 NOTE — Patient Instructions (Addendum)
Medication Instructions:  Your physician recommends that you continue on your current medications as directed. Please refer to the Current Medication list given to you today.  *If you need a refill on your cardiac medications before your next appointment, please call your pharmacy*   Lab Work: None ordered    Testing/Procedures: Your physician has recommended that you have a Cardioversion (DCCV). Electrical Cardioversion uses a jolt of electricity to your heart either through paddles or wired patches attached to your chest. This is a controlled, usually prescheduled, procedure. Defibrillation is done under light anesthesia in the hospital, and you usually go home the day of the procedure. This is done to get your heart back into a normal rhythm. You are not awake for the procedure. Please see the instruction sheet given to you today.    Follow-Up: At [facility], you and your health needs are our priority.  As part of our continuing mission to provide you with exceptional heart care, we have created designated Provider Care Teams.  These Care Teams include your primary Cardiologist (physician) and Advanced Practice Providers (APPs -  Physician Assistants and Nurse Practitioners) who all work together to provide you with the care you need, when you need it.   Remote monitoring is used to monitor your Pacemaker or ICD from home. This monitoring reduces the number of office visits required to check your device to one time per year. It allows [place] to keep an eye on the functioning of your device to ensure it is working properly. You are scheduled for a device check from home on 01/27/22. You may send your transmission at any time that day. If you have a wireless device, the transmission will be sent automatically. After your physician reviews your transmission, you will receive a postcard with your next transmission date.  Your next appointment:   3 month(s) in [place]  The format for your next  appointment:   In Person  Provider:   Allegra Lai, MD   Thank you for choosing [facility]!!   Trinidad Curet, RN [phone]

## 2021-11-30 ENCOUNTER — Telehealth: Payer: Self-pay | Admitting: Cardiology

## 2021-11-30 NOTE — Telephone Encounter (Signed)
I called and spoke with Manuela Schwartz at Dr. Ofilia Neas office for clarification. She advised that the patient needs tooth #17, not 17 teeth extracted. The patient advised Dr. Steffanie Rainwater that he would need dental clearance for his upcoming surgery. I advised that patient is scheduled for a cardioversion and there is no dental clearance that is needed. I advised that patient will not be able to hold anticoagulation for at least 4 weeks post cardioversion. Manuela Schwartz states that Dr. Steffanie Rainwater does not require Eliquis to be held. Patient may proceed with dental extraction as he wishes.  ? ? ?

## 2021-11-30 NOTE — Telephone Encounter (Signed)
Follow Up: ? ? ? ? Manuela Schwartz from the Dentist office said the patient said,  that our office needs clearance in order to have his procedure here in March.. Dr Thereasa Solo said he could not give him clearance due to all the dental work the patient need, that was listed in the clearance. So they do not need clearance, they just wanted you to know why, he could not give him clearance to have procedure. ?

## 2021-11-30 NOTE — Telephone Encounter (Signed)
? ?  Pre-operative Risk Assessment  ?  ?Patient Name: Louis Barton  ?DOB: 17-Jul-1941 ?MRN: [id number]  ? ? ? ?Request for Surgical Clearance   ? ?Procedure:  17 teeth to be extracted , 2  teeth need crown and post another  1 tooth needs a filling ? ?Date of Surgery:   TBD ?                               ?   ?Surgeon:  Dr Bonne Dolores ?Surgeon's Group or Practice Name:   ?Phone number:  [phone] ?Fax number:  [phone] ?  ?Type of Clearance Requested:   ?- Medical  ?  ?Type of Anesthesia:  Local  ?  ?Additional requests/questions:   ? ?Signed, ?Glyn Ade   ?11/30/2021, 11:49 AM  ? ?

## 2021-12-07 ENCOUNTER — Encounter (HOSPITAL_COMMUNITY): Payer: Self-pay | Admitting: Internal Medicine

## 2021-12-15 ENCOUNTER — Encounter (HOSPITAL_COMMUNITY)
Admission: RE | Disposition: A | Discharge status: Home / Self Care | Payer: Self-pay | Source: Ambulatory Visit | Attending: Internal Medicine

## 2021-12-15 ENCOUNTER — Ambulatory Visit (HOSPITAL_COMMUNITY): Payer: Medicare Other | Admitting: Anesthesiology

## 2021-12-15 ENCOUNTER — Ambulatory Visit (HOSPITAL_BASED_OUTPATIENT_CLINIC_OR_DEPARTMENT_OTHER): Payer: Medicare Other | Admitting: Anesthesiology

## 2021-12-15 ENCOUNTER — Ambulatory Visit (HOSPITAL_COMMUNITY)
Admission: RE | Admit: 2021-12-15 | Discharge: 2021-12-15 | Disposition: A | Discharge status: Home / Self Care | Payer: Medicare Other | Source: Ambulatory Visit | Attending: Internal Medicine | Admitting: Internal Medicine

## 2021-12-15 ENCOUNTER — Other Ambulatory Visit: Payer: Self-pay

## 2021-12-15 DIAGNOSIS — I739 Peripheral vascular disease, unspecified: Secondary | ICD-10-CM | POA: Diagnosis not present

## 2021-12-15 DIAGNOSIS — Z79899 Other long term (current) drug therapy: Secondary | ICD-10-CM | POA: Insufficient documentation

## 2021-12-15 DIAGNOSIS — I700 Atherosclerosis of aorta: Secondary | ICD-10-CM | POA: Insufficient documentation

## 2021-12-15 DIAGNOSIS — N1831 Chronic kidney disease, stage 3a: Secondary | ICD-10-CM | POA: Insufficient documentation

## 2021-12-15 DIAGNOSIS — Z7901 Long term (current) use of anticoagulants: Secondary | ICD-10-CM | POA: Diagnosis not present

## 2021-12-15 DIAGNOSIS — I5032 Chronic diastolic (congestive) heart failure: Secondary | ICD-10-CM | POA: Insufficient documentation

## 2021-12-15 DIAGNOSIS — I4891 Unspecified atrial fibrillation: Secondary | ICD-10-CM

## 2021-12-15 DIAGNOSIS — I2510 Atherosclerotic heart disease of native coronary artery without angina pectoris: Secondary | ICD-10-CM | POA: Diagnosis not present

## 2021-12-15 DIAGNOSIS — Z22322 Carrier or suspected carrier of Methicillin resistant Staphylococcus aureus: Secondary | ICD-10-CM | POA: Diagnosis not present

## 2021-12-15 DIAGNOSIS — Z8673 Personal history of transient ischemic attack (TIA), and cerebral infarction without residual deficits: Secondary | ICD-10-CM

## 2021-12-15 DIAGNOSIS — I441 Atrioventricular block, second degree: Secondary | ICD-10-CM | POA: Insufficient documentation

## 2021-12-15 DIAGNOSIS — I4892 Unspecified atrial flutter: Secondary | ICD-10-CM | POA: Diagnosis present

## 2021-12-15 DIAGNOSIS — E785 Hyperlipidemia, unspecified: Secondary | ICD-10-CM | POA: Diagnosis not present

## 2021-12-15 DIAGNOSIS — I340 Nonrheumatic mitral (valve) insufficiency: Secondary | ICD-10-CM | POA: Diagnosis not present

## 2021-12-15 DIAGNOSIS — I10 Essential (primary) hypertension: Secondary | ICD-10-CM | POA: Diagnosis not present

## 2021-12-15 DIAGNOSIS — Z950 Presence of cardiac pacemaker: Secondary | ICD-10-CM | POA: Insufficient documentation

## 2021-12-15 DIAGNOSIS — Z87891 Personal history of nicotine dependence: Secondary | ICD-10-CM | POA: Insufficient documentation

## 2021-12-15 DIAGNOSIS — I4819 Other persistent atrial fibrillation: Secondary | ICD-10-CM

## 2021-12-15 DIAGNOSIS — I130 Hypertensive heart and chronic kidney disease with heart failure and stage 1 through stage 4 chronic kidney disease, or unspecified chronic kidney disease: Secondary | ICD-10-CM | POA: Diagnosis not present

## 2021-12-15 HISTORY — PX: CARDIOVERSION: SHX1299

## 2021-12-15 LAB — POCT I-STAT, CHEM 8
BUN: 15 mg/dL (ref 8–23)
Calcium, Ion: 1.15 mmol/L (ref 1.15–1.40)
Chloride: 105 mmol/L (ref 98–111)
Creatinine, Ser: 1.1 mg/dL (ref 0.61–1.24)
Glucose, Bld: 87 mg/dL (ref 70–99)
HCT: 49 % (ref 39.0–52.0)
Hemoglobin: 16.7 g/dL (ref 13.0–17.0)
Potassium: 4.2 mmol/L (ref 3.5–5.1)
Sodium: 140 mmol/L (ref 135–145)
TCO2: 25 mmol/L (ref 22–32)

## 2021-12-15 SURGERY — CARDIOVERSION
Anesthesia: General

## 2021-12-15 MED ORDER — PROPOFOL 10 MG/ML IV BOLUS
INTRAVENOUS | Status: DC | PRN
  Administered 2021-12-15: 60 mg via INTRAVENOUS

## 2021-12-15 MED ORDER — SODIUM CHLORIDE 0.9 % IV SOLN: INTRAVENOUS | Status: DC

## 2021-12-15 MED ORDER — LIDOCAINE 2% (20 MG/ML) 5 ML SYRINGE
INTRAMUSCULAR | Status: DC | PRN
  Administered 2021-12-15: 60 mg via INTRAVENOUS

## 2021-12-15 NOTE — Op Note (Signed)
Procedure: Electrical Cardioversion ?Indications:  Atrial Flutter ? ?Procedure Details: ? ?Consent: Risks of procedure as well as the alternatives and risks of each were explained to the (patient/caregiver).  Consent for procedure obtained. ? ?Time Out: Verified patient identification, verified procedure, site/side was marked, verified correct patient position, special equipment/implants available, medications/allergies/relevent history reviewed, required imaging and test results available. PERFORMED. ? ?Patient placed on cardiac monitor, pulse oximetry, supplemental oxygen as necessary.  ?Sedation given:  Propofol ?Pacer pads placed anterior and posterior chest. ? ?Cardioverted 1 time(s).  ?Cardioversion with synchronized biphasic 200J shock. ? ?Evaluation: ?Findings: Post procedure EKG shows: A-V paced ?Complications: None ?Patient did tolerate procedure well. ? ?Time Spent Directly with the Patient: ? ?20 minutes  ? ?Phineas Inches E ?12/15/2021, 10:56 AM ? ?

## 2021-12-15 NOTE — Anesthesia Preprocedure Evaluation (Signed)
Anesthesia Evaluation  ?Patient identified by MRN, date of birth, ID band ?Patient awake ? ? ? ?Reviewed: ?Allergy & Precautions, NPO status , Patient's Chart, lab work & pertinent test results ? ?Airway ?Mallampati: II ? ?TM Distance: >3 FB ?Neck ROM: Full ? ? ? Dental ?no notable dental hx. ? ?  ?Pulmonary ?neg pulmonary ROS, former smoker,  ?  ?Pulmonary exam normal ?breath sounds clear to auscultation ? ? ? ? ? ? Cardiovascular ?hypertension, Pt. on medications ?+ Peripheral Vascular Disease  ?Normal cardiovascular exam+ dysrhythmias Atrial Fibrillation + pacemaker  ?Rhythm:Regular Rate:Normal ? ? ?  ?Neuro/Psych ?TIACVA negative psych ROS  ? GI/Hepatic ?negative GI ROS, Neg liver ROS,   ?Endo/Other  ?negative endocrine ROS ? Renal/GU ?Renal InsufficiencyRenal disease  ?negative genitourinary ?  ?Musculoskeletal ?negative musculoskeletal ROS ?(+)  ? Abdominal ?  ?Peds ?negative pediatric ROS ?(+)  Hematology ?negative hematology ROS ?(+)   ?Anesthesia Other Findings ? ? Reproductive/Obstetrics ?negative OB ROS ? ?  ? ? ? ? ? ? ? ? ? ? ? ? ? ?  ?  ? ? ? ? ? ? ? ? ?Anesthesia Physical ?Anesthesia Plan ? ?ASA: 3 ? ?Anesthesia Plan: General  ? ?Post-op Pain Management: Minimal or no pain anticipated  ? ?Induction: Intravenous ? ?PONV Risk Score and Plan: 2 and Treatment may vary due to age or medical condition ? ?Airway Management Planned: Mask and Nasal Cannula ? ?Additional Equipment:  ? ?Intra-op Plan:  ? ?Post-operative Plan:  ? ?Informed Consent: I have reviewed the patients History and Physical, chart, labs and discussed the procedure including the risks, benefits and alternatives for the proposed anesthesia with the patient or authorized representative who has indicated his/her understanding and acceptance.  ? ? ? ?Dental advisory given ? ?Plan Discussed with: CRNA and Surgeon ? ?Anesthesia Plan Comments:   ? ? ? ? ? ? ?Anesthesia Quick Evaluation ? ?

## 2021-12-15 NOTE — Transfer of Care (Signed)
Immediate Anesthesia Transfer of Care Note ? ?Patient: Louis Barton ? ?Procedure(s) Performed: CARDIOVERSION ? ?Patient Location: PACU and Endoscopy Unit ? ?Anesthesia Type:General ? ?Level of Consciousness: drowsy ? ?Airway & Oxygen Therapy: Patient Spontanous Breathing ? ?Post-op Assessment: Report given to RN and Post -op Vital signs reviewed and stable ? ?Post vital signs: Reviewed and stable ? ?Last Vitals:  ?Vitals Value Taken Time  ?BP 121/68   ?Temp    ?Pulse 61   ?Resp    ?SpO2 98   ? ? ?Last Pain:  ?Vitals:  ? 12/15/21 0949  ?TempSrc: Temporal  ?PainSc: 0-No pain  ?   ? ?  ? ?Complications: No notable events documented. ?

## 2021-12-15 NOTE — Procedures (Signed)
Procedure: Electrical Cardioversion ?Indications:  Atrial Flutter ? ?Procedure Details: ? ?Consent: Risks of procedure as well as the alternatives and risks of each were explained to the (patient/caregiver).  Consent for procedure obtained. ? ?Time Out: Verified patient identification, verified procedure, site/side was marked, verified correct patient position, special equipment/implants available, medications/allergies/relevent history reviewed, required imaging and test results available. PERFORMED. ? ?Patient placed on cardiac monitor, pulse oximetry, supplemental oxygen as necessary.  ?Sedation given:  Propofol ?Pacer pads placed anterior and posterior chest. ? ?Cardioverted 1 time(s).  ?Cardioversion with synchronized biphasic 200J shock. ? ?Evaluation: ?Findings: Post procedure EKG shows: NSR ?Complications: None ?Patient did tolerate procedure well. ? ?Time Spent Directly with the Patient: ? ?20 minutes  ? ?Janina Mayo ?12/15/2021, 10:23 AM ? ?

## 2021-12-15 NOTE — Anesthesia Procedure Notes (Signed)
Date/Time: 12/15/2021 10:35 AM ?Performed by: Trinna Post., CRNA ?Pre-anesthesia Checklist: Patient identified, Timeout performed, Emergency Drugs available, Suction available and Patient being monitored ?Patient Re-evaluated:Patient Re-evaluated prior to induction ?Oxygen Delivery Method: Nasal cannula ?Preoxygenation: Pre-oxygenation with 100% oxygen ?Induction Type: IV induction ?Placement Confirmation: positive ETCO2 ? ? ? ? ?

## 2021-12-15 NOTE — Interval H&P Note (Signed)
History and Physical Interval Note: ? ?12/15/2021 ?10:22 AM ? ?[name]  has presented today for surgery, with the diagnosis of AFIB.  The various methods of treatment have been discussed with the patient and family. After consideration of risks, benefits and other options for treatment, the patient has consented to  Procedure(s): ?CARDIOVERSION (N/A) as a surgical intervention.  The patient's history has been reviewed, patient examined, no change in status, stable for surgery.  I have reviewed the patient's chart and labs.  Questions were answered to the patient's satisfaction.   ? ? ?Louis Barton E ? ? ?

## 2021-12-16 NOTE — Anesthesia Postprocedure Evaluation (Signed)
Anesthesia Post Note ? ?Patient: Louis Barton ? ?Procedure(s) Performed: CARDIOVERSION ? ?  ? ?Patient location during evaluation: PACU ?Anesthesia Type: General ?Level of consciousness: awake and alert ?Pain management: pain level controlled ?Vital Signs Assessment: post-procedure vital signs reviewed and stable ?Respiratory status: spontaneous breathing, nonlabored ventilation, respiratory function stable and patient connected to nasal cannula oxygen ?Cardiovascular status: blood pressure returned to baseline and stable ?Postop Assessment: no apparent nausea or vomiting ?Anesthetic complications: no ? ? ?No notable events documented. ? ?Last Vitals:  ?Vitals:  ? 12/15/21 1100 12/15/21 1104  ?BP: (!) 139/58 132/75  ?Pulse: (!) 59 (!) 59  ?Resp: 14 19  ?Temp:    ?SpO2: 97% 98%  ?  ?Last Pain:  ?Vitals:  ? 12/15/21 1104  ?TempSrc:   ?PainSc: 0-No pain  ? ? ?  ?  ?  ?  ?  ?  ? ?Gianella Chismar S ? ? ? ? ?

## 2021-12-19 ENCOUNTER — Encounter (HOSPITAL_COMMUNITY): Payer: Self-pay | Admitting: Internal Medicine

## 2022-01-05 ENCOUNTER — Telehealth: Payer: Self-pay | Admitting: Cardiology

## 2022-01-05 ENCOUNTER — Encounter: Payer: Self-pay | Admitting: Cardiology

## 2022-01-05 NOTE — Telephone Encounter (Signed)
Daughter call looking to get appt for the patient because it is a wire that is going away for the patient monitor. Please advise  ?

## 2022-01-05 NOTE — Telephone Encounter (Signed)
Attempted to return phone call. No answer, LMTCB. ?

## 2022-01-05 NOTE — Telephone Encounter (Signed)
Pt Daughter states that according to pt. There is a wire that seems to visible under the skin. The wire is migrating to left shoulder. Daughter states that she can upload a picture if need be. Pt and daughter would like this checked b/c there was a loose wire just after having device placed. Pt does not have the hiccups. Please advise.  ?

## 2022-01-26 LAB — CUP PACEART REMOTE DEVICE CHECK
Battery Remaining Longevity: 143 mo
Battery Voltage: 3.12 V
Brady Statistic AP VP Percent: 92.21 %
Brady Statistic AP VS Percent: 0.15 %
Brady Statistic AS VP Percent: 7.58 %
Brady Statistic AS VS Percent: 0.04 %
Brady Statistic RA Percent Paced: 74.9 %
Brady Statistic RV Percent Paced: 99.8 %
Date Time Interrogation Session: 20230503051423
Implantable Lead Implant Date: 20221102
Implantable Lead Implant Date: 20221121
Implantable Lead Location: 753859
Implantable Lead Location: 753860
Implantable Lead Model: 3830
Implantable Lead Model: 5076
Implantable Pulse Generator Implant Date: 20221102
Lead Channel Impedance Value: 266 Ohm
Lead Channel Impedance Value: 361 Ohm
Lead Channel Impedance Value: 456 Ohm
Lead Channel Impedance Value: 475 Ohm
Lead Channel Pacing Threshold Amplitude: 0.75 V
Lead Channel Pacing Threshold Amplitude: 1 V
Lead Channel Pacing Threshold Pulse Width: 0.4 ms
Lead Channel Pacing Threshold Pulse Width: 0.4 ms
Lead Channel Sensing Intrinsic Amplitude: 1.25 mV
Lead Channel Sensing Intrinsic Amplitude: 1.25 mV
Lead Channel Sensing Intrinsic Amplitude: 10.625 mV
Lead Channel Sensing Intrinsic Amplitude: 10.625 mV
Lead Channel Setting Pacing Amplitude: 2 V
Lead Channel Setting Pacing Amplitude: 2 V
Lead Channel Setting Pacing Pulse Width: 0.4 ms
Lead Channel Setting Sensing Sensitivity: 0.9 mV

## 2022-01-27 ENCOUNTER — Ambulatory Visit (INDEPENDENT_AMBULATORY_CARE_PROVIDER_SITE_OTHER): Payer: Medicare Other

## 2022-01-27 DIAGNOSIS — I440 Atrioventricular block, first degree: Secondary | ICD-10-CM

## 2022-01-31 ENCOUNTER — Telehealth: Payer: Self-pay | Admitting: Cardiology

## 2022-01-31 NOTE — Telephone Encounter (Signed)
? ?  Patient c/o Palpitations:  High priority if patient c/o lightheadedness, shortness of breath, or chest pain ? ?How long have you had palpitations/irregular HR/ Afib? Are you having the symptoms now? Not no ? ?Are you currently experiencing lightheadedness, SOB or CP? Lightheaded but it comes and goes . Happens when he is in [place]  ? ?Do you have a history of afib (atrial fibrillation) or irregular heart rhythm? Patient has history of Atrial Flutter  ? ?Have you checked your BP or HR? (document readings if available):  ?Patient will print them out and bring a copy to the [place] office  ? ?Are you experiencing any other symptoms? No ? ?

## 2022-02-01 NOTE — Telephone Encounter (Signed)
Pt states that at night he can feel his heart beating in his ears and there's a "rushing noise" that has been going on for about 6 months. States this am at 5:30 he got up and drank a glass of water, went back to bed, and upon waking, the heart beat and rushing was gone. He wants to know if this could be related to dehydration. He also states that he feels like the procedures thus far have not corrected his atrial flutter and he would like to discuss this further with Dr Raliegh Ip. Pt denies wanting a call back, states he has an appt tomorrow with Dr Raliegh Ip, but wanted to alert him to these issues. Will route to [place] now. ?

## 2022-02-02 ENCOUNTER — Ambulatory Visit (INDEPENDENT_AMBULATORY_CARE_PROVIDER_SITE_OTHER): Payer: Medicare Other | Admitting: Cardiology

## 2022-02-02 ENCOUNTER — Encounter: Payer: Self-pay | Admitting: Cardiology

## 2022-02-02 VITALS — BP 120/84 | HR 50 | Ht 68.0 in | Wt 175.4 lb

## 2022-02-02 DIAGNOSIS — R0789 Other chest pain: Secondary | ICD-10-CM

## 2022-02-02 DIAGNOSIS — E7800 Pure hypercholesterolemia, unspecified: Secondary | ICD-10-CM

## 2022-02-02 DIAGNOSIS — I5032 Chronic diastolic (congestive) heart failure: Secondary | ICD-10-CM

## 2022-02-02 DIAGNOSIS — C3490 Malignant neoplasm of unspecified part of unspecified bronchus or lung: Secondary | ICD-10-CM

## 2022-02-02 DIAGNOSIS — I483 Typical atrial flutter: Secondary | ICD-10-CM

## 2022-02-02 DIAGNOSIS — Z950 Presence of cardiac pacemaker: Secondary | ICD-10-CM | POA: Diagnosis not present

## 2022-02-02 DIAGNOSIS — R001 Bradycardia, unspecified: Secondary | ICD-10-CM

## 2022-02-02 MED ORDER — AMIODARONE HCL 200 MG PO TABS: 200.0000 mg | ORAL_TABLET | Freq: Two times a day (BID) | ORAL | 3 refills | Status: DC

## 2022-02-02 NOTE — Patient Instructions (Signed)
Medication Instructions:  ?Your physician has recommended you make the following change in your medication:  ? ?START: Amiodarone 200 mg twice daily  ? ?*If you need a refill on your cardiac medications before your next appointment, please call your pharmacy* ? ? ?Lab Work: ?None ?If you have labs (blood work) drawn today and your tests are completely normal, you will receive your results only by: ?MyChart Message (if you have MyChart) OR ?A paper copy in the mail ?If you have any lab test that is abnormal or we need to change your treatment, we will call you to review the results. ? ? ?Testing/Procedures: ?None ? ? ?Follow-Up: ?At [facility], you and your health needs are our priority.  As part of our continuing mission to provide you with exceptional heart care, we have created designated Provider Care Teams.  These Care Teams include your primary Cardiologist (physician) and Advanced Practice Providers (APPs -  Physician Assistants and Nurse Practitioners) who all work together to provide you with the care you need, when you need it. ? ?We recommend signing up for the patient portal called "MyChart".  Sign up information is provided on this After Visit Summary.  MyChart is used to connect with patients for Virtual Visits (Telemedicine).  Patients are able to view lab/test results, encounter notes, upcoming appointments, etc.  Non-urgent messages can be sent to your provider as well.   ?To learn more about what you can do with MyChart, go to [email or web address].   ? ?Your next appointment:   ?6 week(s) ? ?The format for your next appointment:   ?In Person ? ?Provider:   ?Jenne Campus, MD  ? ? ?Other Instructions ?None ? ?Important Information About Sugar ? ? ? ? ? ? ?

## 2022-02-02 NOTE — Progress Notes (Signed)
?Cardiology Office Note:   ? ?Date:  02/02/2022  ? ?ID:  Louis Barton, DOB 09-27-1940, MRN [id number] ? ?PCP:  Raina Mina., MD  ?Cardiologist:  Jenne Campus, MD   ? ?Referring MD: Raina Mina., MD  ? ?Chief Complaint  ?Patient presents with  ? Follow-up  ? ? ?History of Present Illness:   ? ?Louis Barton is a 81 y.o. male  with quite complex past medical history.  That include coronary artery disease, mild nonobstructive, cardiac catheterization done in May 2022 showed 20% mid and distal circumflex as well as 20% of mid LAD lesion.  Also history of essential hypertension first-degree AV block, status post bilateral carotic endarterectomy, non-small cell lung cancer status post wedge resection and right lower lobe, remote history of TIA.  Recently he ended up being in [facility] because of episode of atrial flutter with slow ventricular rate.  He did have TEE done and then eventually electrical cardioversion.  ?Few months ago he came to hospital with confusion and suspicion for CVA/TIA has been raised however future evaluation showed found that he was positive for COVID-19.  Luckily recovered quite nicely.  While he was in the hospital he was bradycardic with 2-1 AV conduction.  He was eventually referred to EP team for consideration of pacemaker implantation.  Eventually end of November of last year he did have pacemaker implantation.  There was a problem with the lead to the activity will need to be revised after that he was noted to have typical atrial flutter and in March of this year he did have cardioversion and seems to maintain sinus rhythm since then.  He comes today to follow-up.  Tragically strike his wife who had severe arctic stenosis had TAVI done however shortly after that she got vascular collapse and she had perforated left ventricle surgically intervene however eventually she ended up passing.  Obviously shaken by that but seems to be tolerating distress quite well. ?Chief complaint right  now is the fact that he does not feel well, he complained of having some noise in his ear that he can hear his heart beating sometimes stopping sometimes skipping EKG done today showed paced rhythm does some APCs.  I suspect he does have some irregularity of his heart rate and that is what made him feel that way. ? ?Past Medical History:  ?Diagnosis Date  ? Abnormal stress test   ? Aftercare following surgery of the circulatory system, NEC 12/19/2013  ? Arthritis   ? DDD- aging   ? 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 Atherosclerosis 11/16/2015  ? Atypical chest pain 06/11/2015  ? Benign paroxysmal positional vertigo 08/08/2016  ? Bradycardia 03/18/2015  ? CAD (coronary artery disease)   ? Cancer [facility])   ? 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 Carotid stenosis   ? Chronic kidney disease   ? cysts on both kidneys, seeing [name] in [place], [facility] [place]   ? Coronary arteriosclerosis 11/16/2015  ? Formatting of this note might be different from the original. On imaging;  ? Cramps of left lower extremity-Calf  > right calf 01/01/2015  ? Degeneration of lumbar intervertebral disc 11/16/2015  ? Formatting of this note might be different from the original. signficant DDD present with vacuum effect L4-5 and L5-S1 flat discs and anterior spurring noted  ? Dizziness 05/06/2020  ? Dyslipidemia 03/18/2015  ? Dyspnea on exertion 04/02/2020  ?  Ear itch 05/14/2018  ? Edema 11/16/2015  ? Edema of both lower extremities due to peripheral venous insufficiency 02/03/2020  ? First degree AV block 10/20/2020  ? History of hiatal hernia   ? seen on last scan- as slight   ? History of thrombocytopenia   ? History of TIAs   ? Hyperlipidemia   ? Hypertension   ? Irritable bowel syndrome 11/17/2016  ? rec trial of citrucel/ diet 11/16/2016 >>>     ? Local edema 02/07/2018  ? Malaise and fatigue 12/08/2020  ? Malignant neoplasm of left lung ([place]) 12/08/2017  ? Stage 1A. [place]. And Dr. [name]  ? 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 Occlusion and stenosis of carotid artery without mention of cerebral infarction 12/01/2011  ? PAD (peripheral artery disease) ([place])   ? Prediabetes 12/25/2018  ? Preop cardiovascular exam 04/14/2011  ? Renal cysts, acquired, bilateral 10/24/2016  ? Formatting of this note might be different from the original. Renal parapelvic cysts 10/06/16 on CT , right kidney midpole 1.2 cm, right kidney anterior pole 1.1 cm, and to the left kidney lower pole anterior position 3.5cm all felt to be benign  ? Right bundle branch block 10/20/2020  ? Right lower lobe pulmonary nodule 11/09/2017  ? Shingles   ? internal- obstruction of bowels & gallbladder  ? Solitary pulmonary nodule 11/16/2016  ? CT ABd 03/24/09 linerar densities in RLL and RML c/w scarring o/w clear CT Abd 10/20/16 c/w 7 mm nodule  - CT chest 02/13/2017 :  slt enlarged to 9 mm   - PET 04/04/17 :1. These slowly enlarging 9 mm right lower lobe pulmonary nodule along the right hemidiaphragm does not appear hypermetabolic today. Location adjacent to the hemidiaphragm can cause false negatives due to motion artifact related to the  ? Sprain of left ankle 05/21/2021  ? Stage 3a chronic kidney disease ([place]) 06/17/2020  ? Stroke [facility])   ? Swelling of limb-Left foot 01/01/2015  ? Vitamin B12 deficiency 06/21/2017  ? ? ?Past Surgical History:  ?Procedure Laterality Date  ? adenocarcinoma  2019  ? Removed  ? CARDIOVERSION N/A 03/22/2021  ? Procedure: CARDIOVERSION;  Surgeon: Lelon Perla, MD;  Location: [place];  Service: Cardiovascular;  Laterality: N/A;  ? CARDIOVERSION N/A 12/15/2021  ? Procedure: CARDIOVERSION;  Surgeon: Janina Mayo, MD;  Location: [place];  Service: Cardiovascular;  Laterality: N/A;  ? CAROTID ENDARTERECTOMY  2005  ? Left carotid by Dr. Oneida Alar  ? CAROTID ENDARTERECTOMY  04/25/11  ? Right Carotid by Dr. Oneida Alar  ? COLONOSCOPY  02/23/2017  ? Colonic polyp status post polypectomy. Pancolonic diverticulosis predominatly in the sigmoid colon. Tubular  adenoma  ? LEAD REVISION/REPAIR N/A 08/16/2021  ? Procedure: LEAD REVISION/REPAIR;  Surgeon: Constance Haw, MD;  Location: [place] [facility];  Service: Cardiovascular;  Laterality: N/A;  ? LEFT HEART CATH AND CORONARY ANGIOGRAPHY N/A 02/08/2021  ? Procedure: LEFT HEART CATH AND CORONARY ANGIOGRAPHY;  Surgeon: Burnell Blanks, MD;  Location: [place] [facility];  Service: Cardiovascular;  Laterality: N/A;  ? PACEMAKER IMPLANT N/A 07/28/2021  ? Procedure: PACEMAKER IMPLANT;  Surgeon: Constance Haw, MD;  Location: [place] [facility];  Service: Cardiovascular;  Laterality: N/A;  ? Septoplasty, nasal w/ submucosal resection  1960  ? SQUAMOUS CELL CARCINOMA EXCISION    ? pt said numerous times  ? TEE WITHOUT CARDIOVERSION N/A 03/22/2021  ? Procedure: TRANSESOPHAGEAL ECHOCARDIOGRAM (TEE);  Surgeon: Lelon Perla, MD;  Location: [facility] END[facility];  Service:  Cardiovascular;  Laterality: N/A;  ? TONSILLECTOMY    ? VIDEO ASSISTED THORACOSCOPY (VATS)/WEDGE RESECTION Right 11/09/2017  ? Procedure: RIGHT VIDEO ASSISTED THORACOSCOPY WITH Chales Salmon RESECTION;  Surgeon: [name], MD;  Location: [place];  Service: Thoracic;  Laterality: Right;  ? ? ?Current Medications: ?Current Meds  ?Medication Sig  ? acetaminophen (TYLENOL) 500 MG tablet Take 1,000 mg by mouth every 8 (eight) hours as needed for mild pain or moderate pain (for pain.).  ? amiodarone (PACERONE) 200 MG tablet Take 1 tablet (200 mg total) by mouth 2 (two) times daily.  ? amLODipine (NORVASC) 2.5 MG tablet Take 2.5 mg by mouth daily.  ? apixaban (ELIQUIS) 5 MG TABS tablet Take 1 tablet (5 mg total) by mouth 2 (two) times daily. Resume 11/3 with the evening dose.  ? Cholecalciferol (VITAMIN D3) 50 MCG (2000 UT) TABS Take 4,000 Units by mouth every other day.  ? colesevelam (WELCHOL) 625 MG tablet Take 1,250 mg by mouth 2 (two) times daily with a meal.  ? Cyanocobalamin (VITAMIN B-12 IJ) Inject 1 each as directed every 30 (thirty) days.  ?  furosemide (LASIX) 20 MG tablet Take 20-40 mg by mouth daily. Pt sometimes takes one 40 mg tablet daily instead  ? furosemide (LASIX) 40 MG tablet Take 0.5 tablets (20 mg total) by mouth daily. (Patient tak

## 2022-02-10 NOTE — Progress Notes (Signed)
Remote pacemaker transmission.   

## 2022-02-11 LAB — CBC AND DIFFERENTIAL
HCT: 47 (ref 41–53)
Hemoglobin: 14.8 (ref 13.5–17.5)
Neutrophils Absolute: 4.36
Platelets: 136 10*3/uL — AB (ref 150–400)
WBC: 6.5

## 2022-02-11 LAB — BASIC METABOLIC PANEL
BUN: 14 (ref 4–21)
CO2: 29 — AB (ref 13–22)
Chloride: 105 (ref 99–108)
Creatinine: 1 (ref 0.6–1.3)
Glucose: 104
Potassium: 4.1 mEq/L (ref 3.5–5.1)
Sodium: 139 (ref 137–147)

## 2022-02-11 LAB — CBC: RBC: 4.96 (ref 3.87–5.11)

## 2022-02-11 LAB — HEPATIC FUNCTION PANEL
ALT: 20 U/L (ref 10–40)
AST: 30 (ref 14–40)
Alkaline Phosphatase: 48 (ref 25–125)
Bilirubin, Total: 0.8

## 2022-02-11 LAB — COMPREHENSIVE METABOLIC PANEL
Albumin: 3.9 (ref 3.5–5.0)
Calcium: 8.6 — AB (ref 8.7–10.7)

## 2022-02-14 ENCOUNTER — Other Ambulatory Visit: Payer: Self-pay | Admitting: Oncology

## 2022-02-14 ENCOUNTER — Encounter: Payer: Self-pay | Admitting: Oncology

## 2022-02-14 NOTE — Progress Notes (Signed)
[place]  [address] [place],    [address] [phone]  Clinic Day:  02/15/22  Referring physician: Raina Mina., MD  CHIEF COMPLAINT:  CC: Stage IA (T1a N0 M0) non-small cell lung cancer  Current Treatment:  Observation   HISTORY OF PRESENT ILLNESS:  Louis Barton is a 81 y.o. 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  We began seeing him in March 2019 for follow-up.  He was found to have an enlarging right lower lobe nodule in January 2019 and had a wedge resection by VATS procedure by Dr. Modesto Charon in February.  Pathology revealed an invasive moderately differentiated adenocarcinoma measuring 1 cm with 14 - nodes.  His CEA was elevated at 9 postoperatively.He is on observation only.  He is also being followed by the urologist for multiple renal cysts.  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  Colonoscopy in May 2018 revealed colon polyps with pathology revealing tubular adenomas.  Upper endoscopy and colonoscopy in 2019 did not reveal any malignancy, so we feel we have ruled out a gastrointestinal source as the cause of the increased CEA.  The CEA increased to 10.5 in November, but then was back down to 8.9 in February.  Repeat CT imaging of the chest, abdomen and pelvis did not reveal any evidence of malignancy.  Previously seen right pleural effusion had decreased, there was a similar appearance of the suspected rounded atelectasis at the right lung base, which was also decreased.  CEA at that time was 9.9.  There was stable bilateral renal cysts.  He had surgery of the left lateral and posterior neck for additional squamous cell carcinomas of the skin.  Repeat CT chest, abdomen and pelvis in October revealed stable rounded presumed atelectasis in the right lower lobe along the resection site with stable scarring, stable volume loss in the right lower lobe and right middle  lobe, as well as stable small adjacent right pleural effusion with adjacent pleural thickening without changes suggestive of active malignancy.  The bilateral renal cysts were stable.  CT imaging from May 2021 revealed stable appearance of right pleural thickening, trace pleural effusion and adjacent density along the wedge resection site favoring rounded atelectasis and scarring in the right lower lobe.  No compelling findings of active malignancy.  CEA was 10.5, previously 9.7, but this tends to fluctuate up and down in this range.  He has some residual effects from COVID-19 that he had in November 2020. CEA from May 2022 was up to 11.0 from 10.3 the year before. CT scans of abdomen and pelvis were negative.  CT chest in September 2021 showed scarring only.  INTERVAL HISTORY:  Louis Barton is here for routine follow up and states that he is grieving as he lost his wife in April of this year.  This has caused problems with his sleep and appetite.  He does admit to drinking 3 ounces of scotch at night.  He did have COVID-19 infection 1 year ago and may have even had a stroke at that time.  He is here with repeat CT scan to follow-up on his lung cancer and he has had persistent elevation of his CEA.  The CT scan shows changes of right lower lobe lobectomy and unchanged small loculated right pleural effusion with pleural thickening but no significant change from prior scans.  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  He  complains of left leg edema, which is chronic, and also constipation.  He is using stool softeners for that.  He does have a history of tubular adenomas in 2018 and 2019.  His last colonoscopy was with Dr. Lyndel Safe and we will get him referred again, especially since he has persistent elevation of the CEA and no evidence of recurrent lung cancer. CBC and CMP are normal and CEA remains elevated at 11.2, up from 10.2 six months ago.  His weight is stable.  He denies fever, chills or other signs of infection.  He  denies nausea, vomiting, bowel issues, or abdominal pain.  He denies sore throat, cough, dyspnea, or chest pain.  REVIEW OF SYSTEMS:  Review of Systems  Constitutional:  Positive for fatigue (mild). Negative for appetite change, chills, fever and unexpected weight change.  HENT:  Negative.    Eyes: Negative.   Respiratory:  Negative for chest tightness, hemoptysis, shortness of breath and wheezing. Cough: productive with milky white to clear sputum and sinus drainage.  Cardiovascular: Negative.  Negative for chest pain, leg swelling and palpitations.  Gastrointestinal: Negative.  Negative for abdominal distention, abdominal pain, blood in stool, constipation, diarrhea, nausea and vomiting.  Endocrine: Negative.   Genitourinary: Negative.  Negative for difficulty urinating, dysuria, frequency and hematuria.   Musculoskeletal: Negative.  Negative for arthralgias, back pain, flank pain, gait problem and myalgias.       Tenderness and limited range of motion of the left shoulder and arm due to recent pacemaker placement  Skin: Negative.   Neurological: Negative.  Negative for dizziness, extremity weakness, gait problem, headaches, light-headedness, numbness, seizures and speech difficulty.  Hematological: Negative.   Psychiatric/Behavioral:  Negative for depression and sleep disturbance. The patient is nervous/anxious (occasional).   All other systems reviewed and are negative.    VITALS:  Blood pressure 119/69, pulse 71, temperature (!) 97.5 F (36.4 C), temperature source Oral, resp. rate 18, height 5\' 8"  (1.727 m), weight 174 lb 8 oz (79.2 kg), SpO2 96 %.  Wt Readings from Last 3 Encounters:  03/01/22 168 lb (76.2 kg)  02/15/22 174 lb 8 oz (79.2 kg)  02/02/22 175 lb 6.4 oz (79.6 kg)    Body mass index is 26.53 kg/m.  Performance status (ECOG): 1 - Symptomatic but completely ambulatory  PHYSICAL EXAM:  Physical Exam Constitutional:      General: He is not in acute distress.     Appearance: Normal appearance. He is normal weight.  HENT:     Head: Normocephalic and atraumatic.  Eyes:     General: No scleral icterus.    Extraocular Movements: Extraocular movements intact.     Conjunctiva/sclera: Conjunctivae normal.     Pupils: Pupils are equal, round, and reactive to light.  Cardiovascular:     Rate and Rhythm: Normal rate and regular rhythm.     Pulses: Normal pulses.     Heart sounds: Normal heart sounds. No murmur heard.    No friction rub. No gallop.  Pulmonary:     Effort: Pulmonary effort is normal. No respiratory distress.     Breath sounds: Decreased breath sounds (of the right base) present.  Abdominal:     General: Bowel sounds are normal. There is no distension.     Palpations: Abdomen is soft. There is no hepatomegaly, splenomegaly or mass.     Tenderness: There is no abdominal tenderness.  Musculoskeletal:        General: Normal range of motion.     Cervical  back: Normal range of motion and neck supple.     Right lower leg: 1+ Edema present.     Left lower leg: 2+ Edema present.  Lymphadenopathy:     Cervical: No cervical adenopathy.  Skin:    General: Skin is warm and dry.  Neurological:     General: No focal deficit present.     Mental Status: He is alert and oriented to person, place, and time. Mental status is at baseline.  Psychiatric:        Mood and Affect: Mood normal.        Behavior: Behavior normal.        Thought Content: Thought content normal.        Judgment: Judgment normal.    LABS:      Latest Ref Rng & Units 12/15/2021   10:02 AM 08/13/2021    4:11 PM 05/17/2021   12:00 AM  CBC  WBC 3.4 - 10.8 x10E3/uL  7.4  7.2   Hemoglobin 13.0 - 17.0 g/dL 16.7  14.4  15.8   Hematocrit 39.0 - 52.0 % 49.0  44.1  46.4   Platelets 150 - 450 x10E3/uL  158  206       Latest Ref Rng & Units 12/15/2021   10:02 AM 08/18/2021   12:00 AM 07/26/2021    8:59 AM  CMP  Glucose 70 - 99 mg/dL 87   96   BUN 8 - 23 mg/dL 15  14     8     Creatinine 0.61 - 1.24 mg/dL 1.10  0.9     0.97   Sodium 135 - 145 mmol/L 140  138     140   Potassium 3.5 - 5.1 mmol/L 4.2  3.7     4.2   Chloride 98 - 111 mmol/L 105  108     103   CO2 13 - 22  24     24    Calcium 8.7 - 10.7  8.5     9.3   Alkaline Phos 25 - 125  43       AST 14 - 40  23       ALT 10 - 40  15          This result is from an external source.     Lab Results  Component Value Date   CEA1 10.2 (H) 08/18/2021   /  CEA  Date Value Ref Range Status  08/18/2021 10.2 (H) 0.0 - 4.7 ng/mL Final    Comment:    (NOTE)                             Nonsmokers          <3.9                             Smokers             <5.6 [employer] Electrochemiluminescence Immunoassay (ECLIA) Values obtained with different assay methods or kits cannot be used interchangeably.  Results cannot be interpreted as absolute evidence of the presence or absence of malignant disease. Performed At: [facility] [place], [place] [id number] Rush Farmer MD [id number]     STUDIES:  No results found.    HISTORY:   Allergies: No Known Allergies  Current Medications: Current Outpatient Medications  Medication Sig Dispense Refill   acetaminophen (  TYLENOL) 500 MG tablet Take 1,000 mg by mouth every 8 (eight) hours as needed for mild pain or moderate pain (for pain.).     amiodarone (PACERONE) 200 MG tablet Take 1 tablet (200 mg total) by mouth 2 (two) times daily. 180 tablet 3   amLODipine (NORVASC) 2.5 MG tablet Take 2.5 mg by mouth daily.     apixaban (ELIQUIS) 5 MG TABS tablet Take 1 tablet (5 mg total) by mouth 2 (two) times daily. Resume 11/3 with the evening dose. 180 tablet 3   Cholecalciferol (VITAMIN D3) 50 MCG (2000 UT) TABS Take 4,000 Units by mouth every other day.     colesevelam (WELCHOL) 625 MG tablet Take 1,250 mg by mouth 2 (two) times daily with a meal.     Cyanocobalamin (VITAMIN B-12 IJ) Inject 1 each as directed every 30 (thirty)  days.     furosemide (LASIX) 20 MG tablet TAKE 1 TABLET BY MOUTH DAILY. 90 tablet 2   Polyethyl Glycol-Propyl Glycol (SYSTANE ULTRA OP) Place 1 drop into both eyes daily. Unknown strength     potassium chloride (KLOR-CON) 10 MEQ tablet Take 20 mEq by mouth daily.     rosuvastatin (CRESTOR) 40 MG tablet Take 1 tablet (40 mg total) by mouth at bedtime.     valACYclovir (VALTREX) 1000 MG tablet Take 1,000 mg by mouth 2 (two) times daily as needed (for fever blisters.).      No current facility-administered medications for this visit.     ASSESSMENT & PLAN:   Assessment:    1. 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  He remains without evidence of disease over 4 years postop.  2.  Elevation of the CEA of uncertain etiology, which tends to fluctuate up and down, but is persistent and was up to 11.2 now, but was 11.0 in May 2022.  3. Multiple renal cysts being monitored by Dr. Tamala Julian, his urologist with [facility].  4. Multiple skin cancers. He had a squamous cell carcinoma resected from the upper right anterior chest.   5. History of COVID-19 from November 2020 and again in April 2022.  6.  Multiple colon polyps.  His last colonoscopy was in October 2019 with Dr. Lyndel Safe, which revealed two polyps consistent with tubular adenomas.  I therefore recommend that he undergo at least 1 more colonoscopy as he is a fairly healthy individual, especially with the persistently elevated CEA.    Plan: He needs to have another colonoscopy with Dr. Lyndel Safe and we will push for that.  I will see him back in 6 months with CBC, CMP, and CEA for repeat evaluation. If the CEA has significantly increased, we will obtain CT imaging again. The patient understands the plans discussed today and is in agreement with them.  The patient knows to contact our office if his develops concerns prior to his next appointment.  I provided 30 minutes of face-to-face time during  this this encounter and > 50% was spent counseling as documented under my assessment and plan.

## 2022-02-15 ENCOUNTER — Inpatient Hospital Stay: Payer: Medicare Other | Attending: Oncology | Admitting: Oncology

## 2022-02-15 ENCOUNTER — Telehealth: Payer: Self-pay

## 2022-02-15 ENCOUNTER — Other Ambulatory Visit: Payer: Self-pay

## 2022-02-15 ENCOUNTER — Encounter: Payer: Self-pay | Admitting: Oncology

## 2022-02-15 ENCOUNTER — Other Ambulatory Visit: Payer: Self-pay | Admitting: Oncology

## 2022-02-15 VITALS — BP 119/69 | HR 71 | Temp 97.5°F | Resp 18 | Ht 68.0 in | Wt 174.5 lb

## 2022-02-15 DIAGNOSIS — C3490 Malignant neoplasm of unspecified part of unspecified bronchus or lung: Secondary | ICD-10-CM | POA: Diagnosis not present

## 2022-02-15 NOTE — Telephone Encounter (Signed)
Printed

## 2022-02-15 NOTE — Telephone Encounter (Signed)
-----   Message from Derwood Kaplan, MD sent at 02/14/2022  7:07 PM EDT ----- Regarding: CT Did he get his CT and labs?  If so, pls print

## 2022-02-16 ENCOUNTER — Telehealth: Payer: Self-pay | Admitting: Gastroenterology

## 2022-02-16 NOTE — Telephone Encounter (Signed)
Hi Dr. Lyndel Safe,  Patients daughter Lorre Nick called requesting to schedule a colonoscopy. Stated he visited his Oncologist yesterday and she is recommending for him to have one. Her progress note is available for you  to review in Epic.  Please advise. Thanks

## 2022-02-17 ENCOUNTER — Other Ambulatory Visit: Payer: Self-pay | Admitting: Cardiology

## 2022-02-18 NOTE — Telephone Encounter (Signed)
Can proceed with colonoscopy directly. See my last note. Would need to hold Plavix for 5 days prior and need cardiology clearance prior RG

## 2022-02-22 ENCOUNTER — Telehealth: Payer: Self-pay

## 2022-02-22 NOTE — Telephone Encounter (Signed)
Pt was scheduled for a previsit appointment on 03/01/2022 at 10:30: In person Appointment: Pt made aware: Pt scheduled for an Colonoscopy with Dr. Lyndel Safe on 03/24/2022 at 2:00 in the [facility]  Pt made aware: Request for Cardiac Clearance and Eliguis hold sent to pt cardiologist. Pt made aware Pt verbalized understanding with all questions answered.

## 2022-02-22 NOTE — Telephone Encounter (Signed)
Patient with diagnosis of aflutter on Eliquis for anticoagulation.    Procedure: colonoscopy Date of procedure: 03/24/22  CHA2DS2-VASc Score = 6  This indicates a 9.7% annual risk of stroke. The patient's score is based upon: CHF History: 0 HTN History: 1 Diabetes History: 0 Stroke History: 2 Vascular Disease History: 1 Age Score: 2 Gender Score: 0  CrCl 7mL/min Platelet count 130K  Per office protocol, recommend that patient hold Eliquis for only 1 day prior to procedure. He should resume as soon as safely possible after given his elevated CV risk.

## 2022-02-22 NOTE — Telephone Encounter (Signed)
Clinical pharmacist to review Eliquis 

## 2022-02-22 NOTE — Telephone Encounter (Signed)
Please disregard previous message for the hold on the Plavix. Pt is on [place] Pre-operative Risk Assessment     Louis Barton [birth year] [id number]   Procedure: Colonoscopy Anesthesia type:  MAC Procedure Date: 03/24/2022 Provider: Dr. Lyndel Safe   Type of Clearance needed: Pharmacy/Cardiac/  Medication(s) needing held: Eliquis   Length of time for medication to be held: 2 days   Please review request and advise by either responding to this message or by sending your response to the fax # provided below.   Thank you,   [facility]  Phone: [phone] Fax: [phone] ATTENTION: Gillermina Hu RN

## 2022-02-22 NOTE — Telephone Encounter (Signed)
[facility] Pre-operative Risk Assessment     Louis Barton 1940-10-09 [id number]  Procedure: Colonoscopy Anesthesia type:  MAC Procedure Date: 03/24/2022 Provider: Dr. Lyndel Safe  Type of Clearance needed: Pharmacy/Cardiac/  Medication(s) needing held: Plavix   Length of time for medication to be held: 2 days  Please review request and advise by either responding to this message or by sending your response to the fax # provided below.  Thank you,  [facility]  Phone: [phone] Fax: [phone] ATTENTION: Gillermina Hu RN

## 2022-02-26 IMAGING — CT CT ABD-PELV W/O CM
2 of 4 series · 16 of 46 positions shown, 18 images · non-contrast
Comparison: 02/04/2020

CLINICAL DATA: Abdominal distension, lower extremity edema,
abnormal lower extremity venous ultrasound question more central
clot, history stage III A chronic kidney disease, coronary artery
disease, hypertension, hiatal hernia, former smoker

EXAM:
CT ABDOMEN AND PELVIS WITHOUT CONTRAST
TECHNIQUE: Multidetector CT imaging of the abdomen and pelvis was performed
following the standard protocol without IV contrast. Sagittal and
coronal MPR images reconstructed from axial data set.

[Series 3: a/p w/o 5mm · axial · non-contrast · 0.82mm/px · z∈[+886,+1351]mm · 13 of 103 slices shown, 15 images]
[im 5/103  soft-tissue]
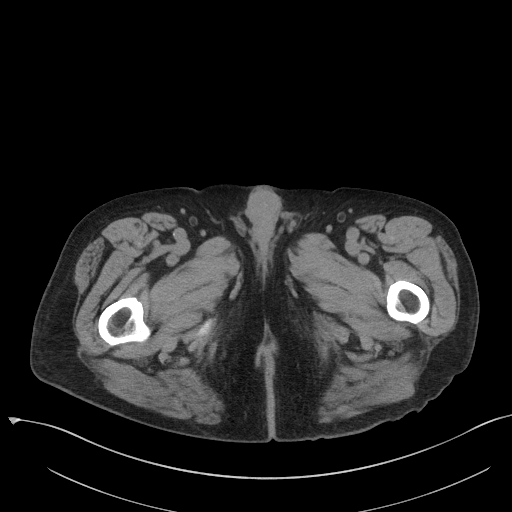
[im 5/103  bone]
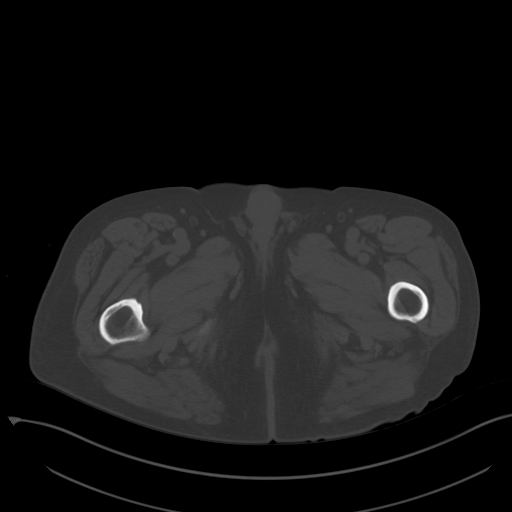
[im 14/103  soft-tissue]
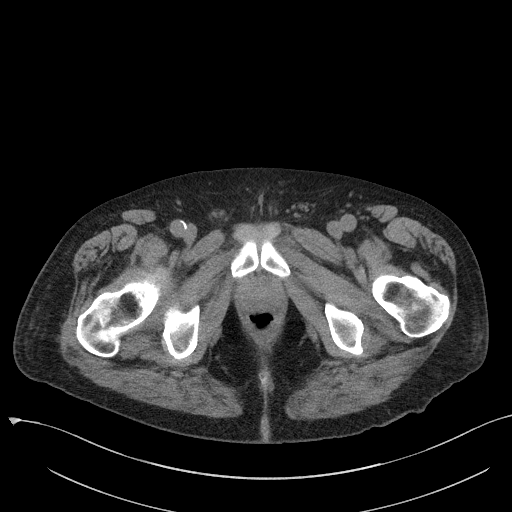
[im 23/103  soft-tissue]
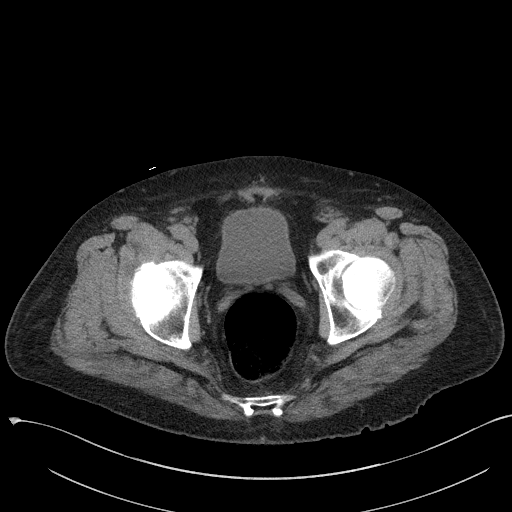
[im 27/103  soft-tissue]
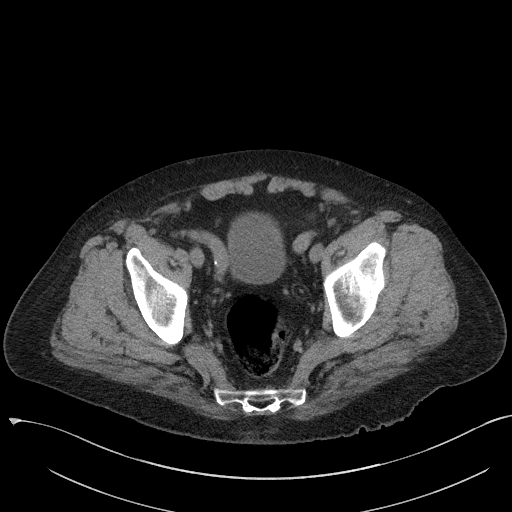
[im 36/103  soft-tissue]
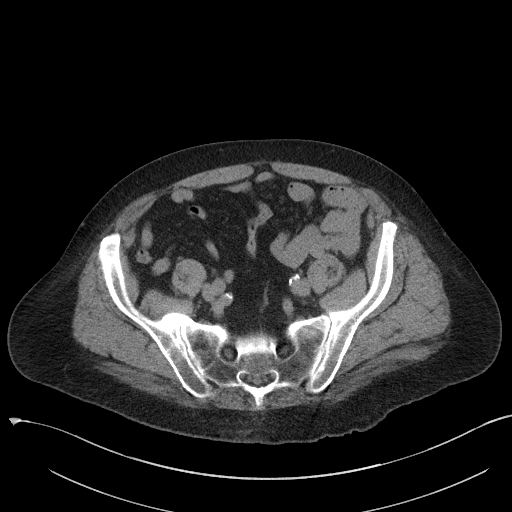
[im 45/103  soft-tissue]
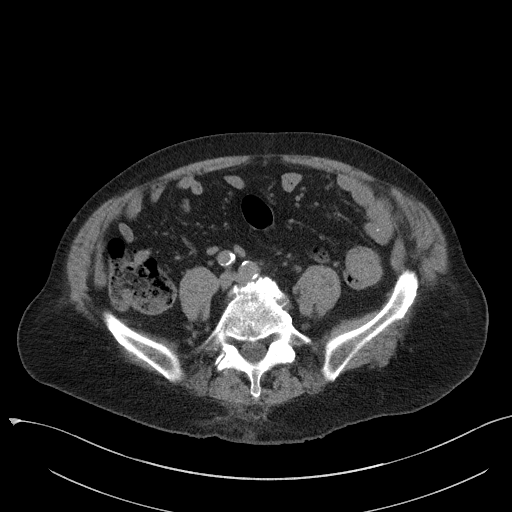
[im 54/103  soft-tissue]
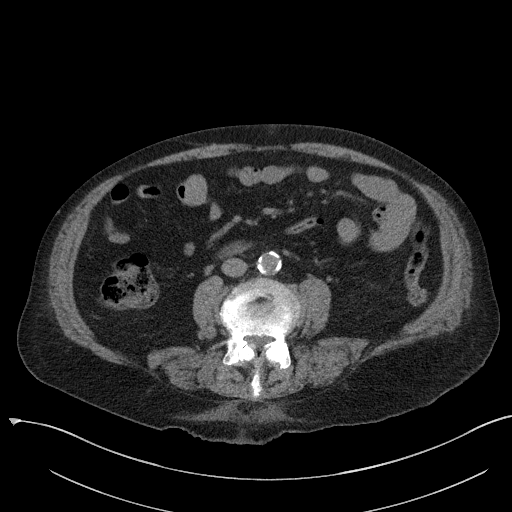
[im 58/103  soft-tissue]
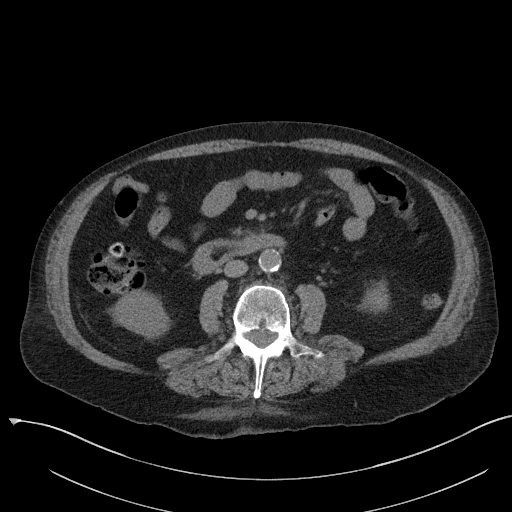
[im 67/103  soft-tissue]
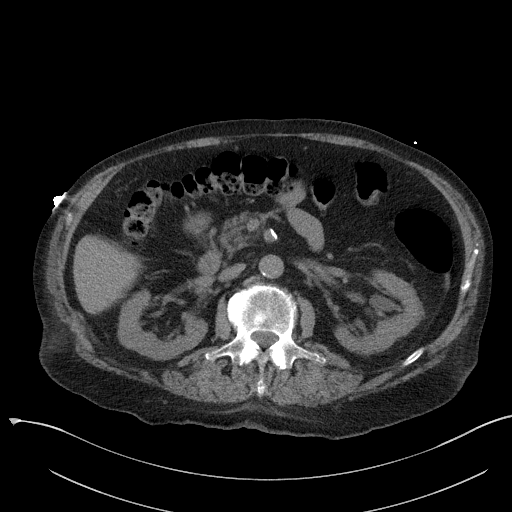
[im 67/103  bone]
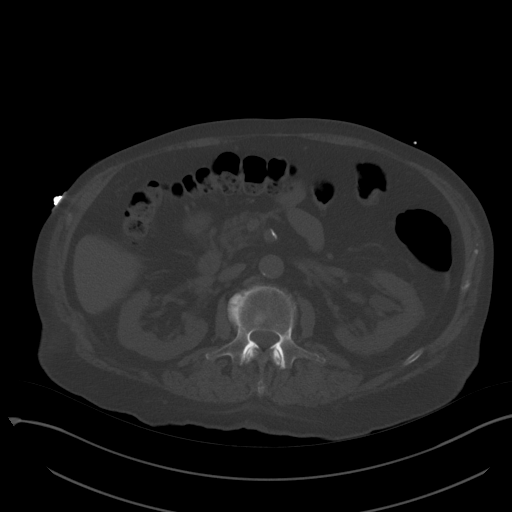
[im 76/103  soft-tissue]
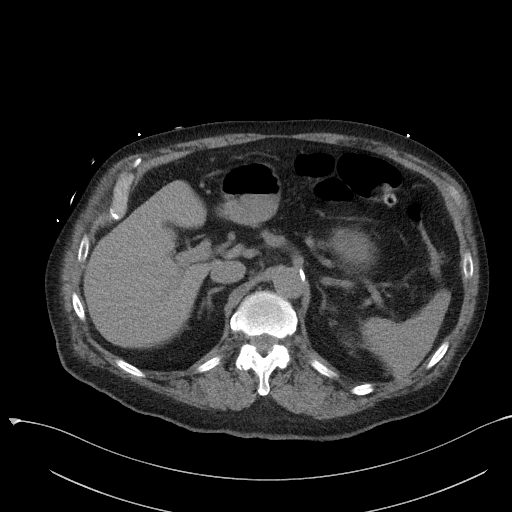
[im 80/103  soft-tissue]
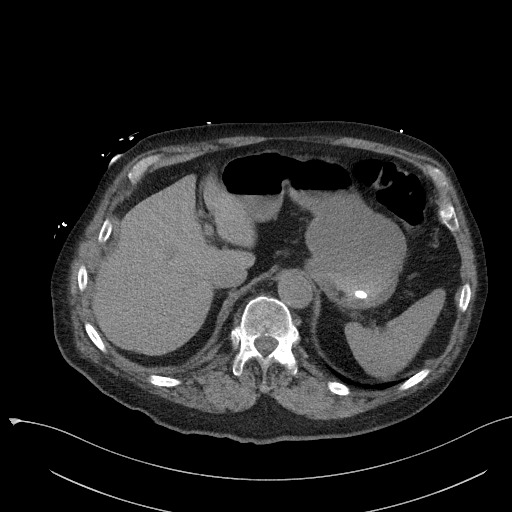
[im 89/103  soft-tissue]
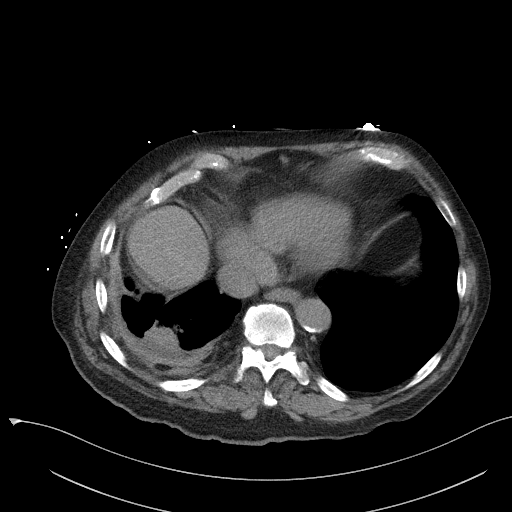
[im 98/103  soft-tissue]
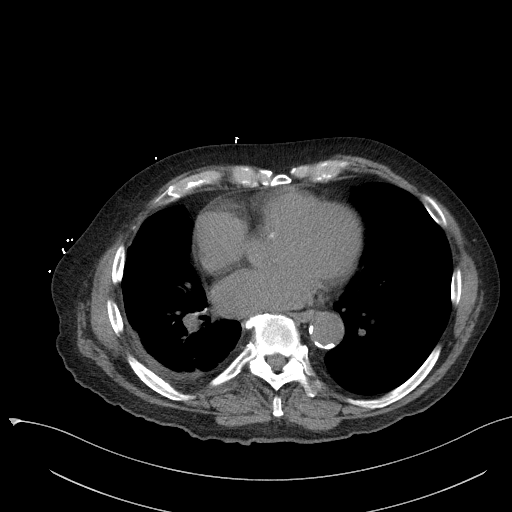

[Series 6: a/p w/o cor · coronal · non-contrast · 0.92mm/px · 3 of 137 slices shown]
[im 46/137  soft-tissue]
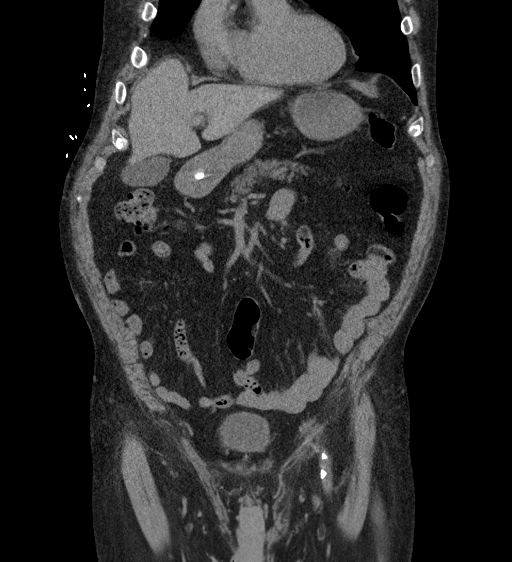
[im 61/137  soft-tissue]
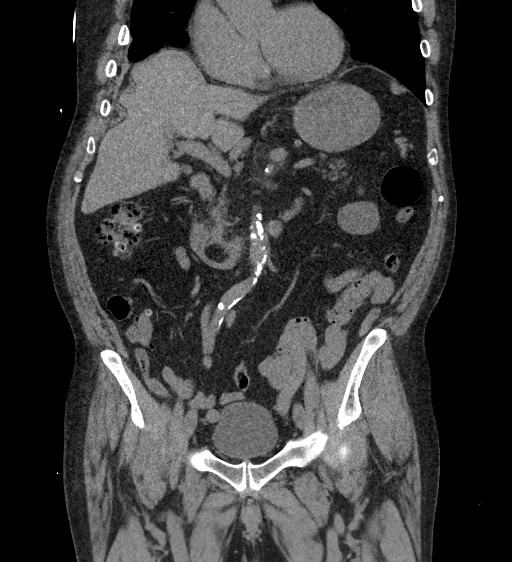
[im 76/137  soft-tissue]
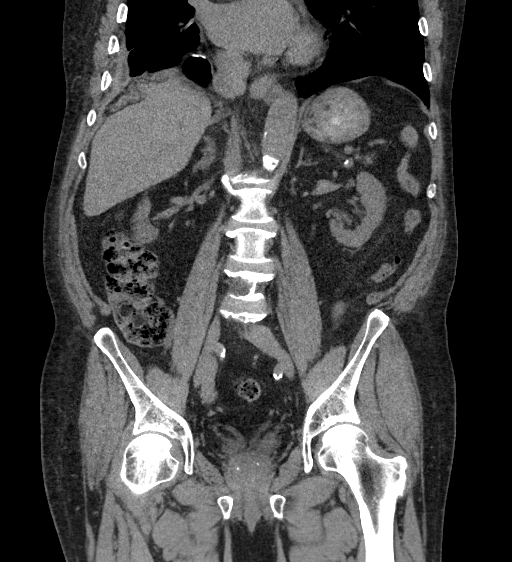

[16 of 46 positions shown; findings below may reference images not displayed]

FINDINGS: Lower chest: Postsurgical changes RIGHT lower lobe with small RIGHT
pleural effusion and RIGHT lower lobe atelectasis versus scarring.
Remaining lung bases clear.

Hepatobiliary: Gallbladder and liver normal appearance

Pancreas: Normal appearance

Spleen: Normal appearance

Adrenals/Urinary Tract: Adrenal glands normal appearance. BILATERAL
renal cysts including peripelvic cysts, largest cyst anterior LEFT
kidney 3.9 x 2.7 cm. Ureters and bladder unremarkable.

Stomach/Bowel: Normal appendix. Redundant sigmoid colon. Lipoma of
the third portion of the duodenum 29 x 10 x 19 mm. Small hiatal
hernia. Stomach and bowel loops otherwise normal appearance.

Vascular/Lymphatic: Atherosclerotic calcifications involving
coronary arteries, aorta, visceral arteries, iliac arteries, common
femoral arteries. Aorta normal caliber. No adenopathy.

Reproductive: Unremarkable prostate gland and seminal vesicles

Other: No free air or free fluid.  No hernia.

Musculoskeletal: Scattered degenerative disc disease changes lumbar
spine.
IMPRESSION: BILATERAL renal cysts.

Small duodenal lipoma 29 x 10 x 19 mm.

Small hiatal hernia.

Stable postsurgical changes and probable atelectasis/scarring at
RIGHT lung base.

Scattered atherosclerotic calcifications including coronary arteries
and visceral arteries.

No acute intra-abdominal or intrapelvic abnormalities.

Aortic Atherosclerosis (E9IBR-61L.L).

## 2022-03-01 ENCOUNTER — Ambulatory Visit (AMBULATORY_SURGERY_CENTER): Payer: Medicare Other | Admitting: *Deleted

## 2022-03-01 ENCOUNTER — Other Ambulatory Visit: Payer: Self-pay

## 2022-03-01 VITALS — Ht 68.0 in | Wt 168.0 lb

## 2022-03-01 DIAGNOSIS — Z86010 Personal history of colonic polyps: Secondary | ICD-10-CM

## 2022-03-01 DIAGNOSIS — R970 Elevated carcinoembryonic antigen [CEA]: Secondary | ICD-10-CM

## 2022-03-01 MED ORDER — PLENVU 140 G PO SOLR: 1.0000 | Freq: Once | ORAL | 0 refills | Status: AC

## 2022-03-01 NOTE — Progress Notes (Signed)
  Pre visit conducted in person, patient was accompanies by daughter.  No egg or soy allergy known to patient  No issues known to pt with past sedation with any surgeries or procedures Patient denies ever being told they had issues or difficulty with intubation  No FH of Malignant Hyperthermia Pt is not on diet pills Pt is not on  home 02  Pt is not on blood thinners  Pt denies issues with constipation  No A fib or A flutter  Coupon to pt in PV today , Code to Pharmacy and  NO PA's for preps discussed with pt In PV today  Discussed with pt there will be an out-of-pocket cost for prep and that varies from $0 to 70 +  dollars - pt verbalized understanding  Pt instructed to use Singlecare.com or GoodRx for a price reduction on prep   PV completed over the phone. Pt verified name, DOB, address and insurance during PV today.  Pt mailed instruction packet with copy of consent form to read and not return, and instructions.  Pt encouraged to call with questions or issues.  If pt has My chart, procedure instructions sent via My Chart  Insurance confirmed with pt at [facility] today

## 2022-03-06 ENCOUNTER — Encounter: Payer: Self-pay | Admitting: Certified Registered Nurse Anesthetist

## 2022-03-07 ENCOUNTER — Telehealth: Payer: Self-pay

## 2022-03-07 NOTE — Telephone Encounter (Signed)
Received fax for pt Eliquis  hold prior to procedure. Documents copied and sent to personal to be scanned into computer system

## 2022-03-14 ENCOUNTER — Encounter: Payer: Medicare Other | Admitting: Cardiology

## 2022-03-14 ENCOUNTER — Ambulatory Visit (AMBULATORY_SURGERY_CENTER): Payer: Medicare Other | Admitting: Gastroenterology

## 2022-03-14 ENCOUNTER — Encounter: Payer: Self-pay | Admitting: Gastroenterology

## 2022-03-14 VITALS — BP 92/59 | HR 60 | Temp 97.3°F | Resp 16 | Ht 68.0 in | Wt 168.0 lb

## 2022-03-14 DIAGNOSIS — Z09 Encounter for follow-up examination after completed treatment for conditions other than malignant neoplasm: Secondary | ICD-10-CM | POA: Diagnosis not present

## 2022-03-14 DIAGNOSIS — R970 Elevated carcinoembryonic antigen [CEA]: Secondary | ICD-10-CM

## 2022-03-14 DIAGNOSIS — Z86010 Personal history of colonic polyps: Secondary | ICD-10-CM | POA: Diagnosis not present

## 2022-03-14 DIAGNOSIS — D175 Benign lipomatous neoplasm of intra-abdominal organs: Secondary | ICD-10-CM

## 2022-03-14 MED ORDER — SODIUM CHLORIDE 0.9 % IV SOLN: 500.0000 mL | Freq: Once | INTRAVENOUS | Status: DC

## 2022-03-14 NOTE — Progress Notes (Signed)
I have reviewed the patient's medical history in detail and updated the computerized patient record.

## 2022-03-14 NOTE — Progress Notes (Signed)
Called to room to assist during endoscopic procedure.  Patient ID and intended procedure confirmed with present staff. Received instructions for my participation in the procedure from the performing physician.  

## 2022-03-14 NOTE — Progress Notes (Signed)
Report given to PACU, vss 

## 2022-03-14 NOTE — Progress Notes (Signed)
Chief Complaint: Elevated CEA  Referring Provider:  Raina Mina., MD, Dr Hinton Rao      ASSESSMENT AND PLAN;   #1. Elevated CEA (9-9.4). H/O smoking in the past. H/O polyps 01/2017, 2019 #2. 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 Being followed by CTS. #3. Borderline bradycardia- undergoing cardiac eval by Dr Raliegh Ip (had recent 48hr cardiac monitor,), s/p stress test 3 yrs ago. Neg 2DE 03/2020. Has appt with Dr Raliegh Ip tomorrow #4. Asymptomatic HH #5. 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  I do agree. - Cardiac clearence prior - Hold plavix 5 days before. Can continue aspirin throughout. -Trend CEA level (could be elevated due to non-GI reasons). He has appt with Dr Hinton Rao.  HPI:    Louis Barton is a 81 y.o. male   For elevated CEA.  Has increased from 9 to 10.4  No GI symptoms  Last colonoscopy October 2019 was negative except for small tubular adenoma.  He had good preparation.   Seen by Dr. Hinton Rao and advised to repeat colonoscopy at an earlier interval.  Despite of his advanced age, he is doing very well medically.  Has appointment with Dr. Raliegh Ip tomorrow    Past GI procedures: -Colonoscopy 06/2018: small polyp s/p polypectomy- 02/23/2017: (PCF)2 tubular adenomas, moderate sigmoid diverticulosis. 01/2014: Tubular adenomas. 09/2008: Colon polyps. 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 [place]. And Dr. 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 Renal parapelvic cysts 10/06/16 on CT , right kidney midpole 1.2 cm, right kidney anterior pole 1.1 cm, and to the left kidney lower pole anterior position 3.5cm all felt to be benign   Right bundle branch block 10/20/2020   Right lower lobe pulmonary nodule 11/09/2017   Shingles    internal- obstruction of bowels & gallbladder   Solitary pulmonary nodule 11/16/2016   CT ABd 03/24/09 linerar densities in RLL and RML c/w scarring o/w clear CT Abd 10/20/16 c/w 7 mm nodule  - CT chest 02/13/2017 :  slt  enlarged to 9 mm   - PET 04/04/17 :1. These slowly enlarging 9 mm right lower lobe pulmonary nodule along the right hemidiaphragm does not appear hypermetabolic today. Location adjacent to the hemidiaphragm can cause false negatives due to motion artifact related to the   Sprain of left ankle 05/21/2021   Stage 3a chronic kidney disease ([place]) 06/17/2020   Stroke ([place])    Swelling of limb-Left foot 01/01/2015   Vitamin B12 deficiency 06/21/2017    Past Surgical History:  Procedure Laterality Date   adenocarcinoma  2019   Removed   CARDIOVERSION N/A 03/22/2021   Procedure: CARDIOVERSION;  Surgeon: Lelon Perla, MD;  Location: [facility];  Service: Cardiovascular;  Laterality: N/A;   CARDIOVERSION N/A 12/15/2021   Procedure: CARDIOVERSION;  Surgeon: Janina Mayo, MD;  Location: [place];  Service: Cardiovascular;  Laterality: N/A;   CAROTID ENDARTERECTOMY  2005   Left carotid by Dr. Oneida Alar   CAROTID ENDARTERECTOMY  04/25/11   Right Carotid by Dr. Oneida Alar   COLONOSCOPY  02/23/2017   Colonic polyp status post polypectomy. Pancolonic diverticulosis predominatly in the sigmoid colon. Tubular adenoma   LEAD REVISION/REPAIR N/A 08/16/2021   Procedure: LEAD REVISION/REPAIR;  Surgeon: Constance Haw, MD;  Location: [place] [facility];  Service: Cardiovascular;  Laterality: N/A;   LEFT HEART CATH AND CORONARY ANGIOGRAPHY N/A 02/08/2021   Procedure: LEFT HEART CATH AND CORONARY ANGIOGRAPHY;  Surgeon: Burnell Blanks, MD;  Location: [place] [facility];  Service: Cardiovascular;  Laterality: N/A;   PACEMAKER IMPLANT N/A 07/28/2021   Procedure: PACEMAKER IMPLANT;  Surgeon: Constance Haw, MD;  Location: [place] [facility];  Service: Cardiovascular;  Laterality: N/A;   Septoplasty, nasal w/ submucosal resection  1960   SQUAMOUS CELL CARCINOMA EXCISION     pt said numerous times   TEE WITHOUT CARDIOVERSION N/A 03/22/2021   Procedure: TRANSESOPHAGEAL ECHOCARDIOGRAM (TEE);   Surgeon: Lelon Perla, MD;  [facility]OSCOPY;  Service: Cardiovascular;  Laterality: N/A;   TONSILLECTOMY     VIDEO ASSISTED THORACOSCOPY (VATS)/WEDGE RESECTION Right 11/09/2017   Procedure: RIGHT VIDEO ASSISTED THORACOSCOPY WITH Chales Salmon RESECTION;  Surgeon: [name], MD;  Location: [place];  Service: Thoracic;  Laterality: Right;    Family History  Problem Relation Age of Onset   Cancer Mother 44       Breast   Stroke Mother    Hyperlipidemia Mother    Other Mother        varicose veins   Heart attack Mother    Heart disease Mother        Left ankle swelling   Hypertension Mother    Deep vein thrombosis Mother    Varicose Veins Mother    Heart attack Father    Other Sister        Tumor in Lung  and Brain   Cancer Sister        Tumor   Lung  and  Brain   Cancer Brother        BCC-SCC-Merkle Cell   Colon cancer Neg Hx    Esophageal cancer Neg Hx    Rectal cancer Neg Hx    Stomach cancer Neg Hx     Social History   Tobacco Use   Smoking status: Former    Packs/day: 1.00    Years: 16.00    Total pack years: 16.00    Types: Cigarettes    Quit date: 09/26/1972    Years since quitting: 49.4   Smokeless tobacco: Never   Tobacco comments:    Former smoker 08/11/21  Vaping Use   Vaping Use: Never used  Substance Use Topics   Alcohol use: Not Currently    Alcohol/week: 7.0 standard drinks of alcohol    Types: 7 Shots of liquor per week    Comment: 1 shot daily 08/11/21   Drug use: No    Current Outpatient Medications  Medication Sig Dispense Refill   amLODipine (NORVASC) 2.5 MG tablet Take 2.5 mg by mouth daily.     Cholecalciferol (VITAMIN D3) 50 MCG (2000 UT) TABS Take 4,000 Units by mouth every other day.     colesevelam (WELCHOL) 625 MG tablet Take 1,250 mg by mouth 2 (two) times daily with a meal.     furosemide (LASIX) 20 MG tablet TAKE 1 TABLET BY MOUTH DAILY. 90 tablet 2   Polyethyl Glycol-Propyl Glycol (SYSTANE ULTRA OP) Place 1 drop into  both eyes daily. Unknown strength     potassium chloride (KLOR-CON) 10 MEQ tablet Take 20 mEq by mouth daily.     rosuvastatin (CRESTOR) 40 MG tablet Take 1 tablet (40 mg total) by mouth at bedtime.     acetaminophen (TYLENOL) 500 MG tablet Take 1,000 mg by mouth every 8 (eight) hours as needed for mild pain or moderate pain (for pain.).     amiodarone (PACERONE) 200 MG tablet Take 1 tablet (200 mg total) by mouth 2 (two) times daily. 180 tablet 3   apixaban (ELIQUIS) 5 MG TABS tablet Take 1 tablet (5 mg total) by mouth 2 (two) times daily. Resume 11/3 with the evening dose. 180 tablet 3   Cyanocobalamin (VITAMIN B-12 IJ) Inject 1 each as directed every 30 (thirty) days.     valACYclovir (VALTREX) 1000 MG tablet Take 1,000 mg by mouth 2 (two) times daily as needed (for fever blisters.).      Current Facility-Administered Medications  Medication Dose Route Frequency Provider Last Rate Last Admin   0.9 %  sodium chloride infusion  500 mL Intravenous Once Jackquline Denmark, MD        No Known Allergies  Review of Systems:  neg     Physical Exam:    BP (!) 111/51   Pulse 84   Temp (!) 97.3 F (36.3 C) (Temporal)   Ht 5\' 8"  (1.727 m)   Wt 168 lb (76.2 kg)   SpO2 96%   BMI 25.54 kg/m  Filed Weights   03/14/22 0805  Weight: 168 lb (76.2 kg)   Constitutional:  Well-developed, in no acute distress. Psychiatric: Normal mood and affect. Behavior is normal. HEENT: Pupils normal.  Conjunctivae are normal. No scleral icterus. Neck supple.  Cardiovascular: Normal rate, regular rhythm. No edema Pulmonary/chest: Effort normal and breath sounds normal. No wheezing, rales or rhonchi. Abdominal: Soft, nondistended. Nontender. Bowel sounds active throughout.  There are no masses palpable. No hepatomegaly. Rectal:  defered Neurological: Alert and oriented to person place and time. Skin: Skin is warm and dry. No rashes noted.  Data Reviewed: I have personally reviewed following labs and imaging  studies  CBC:    Latest Ref Rng & Units 12/15/2021   10:02 AM 08/13/2021    4:11 PM 05/17/2021   12:00 AM  CBC  WBC 3.4 - 10.8 x10E3/uL  7.4  7.2   Hemoglobin 13.0 - 17.0 g/dL 16.7  14.4  15.8   Hematocrit 39.0 - 52.0 % 49.0  44.1  46.4   Platelets 150 - 450 x10E3/uL  158  206     CMP:    Latest Ref Rng & Units 12/15/2021   10:02 AM 08/18/2021   12:00 AM 07/26/2021    8:59 AM  CMP  Glucose 70 - 99 mg/dL 87   96   BUN 8 - 23 mg/dL 15  14     8    Creatinine 0.61 - 1.24 mg/dL 1.10  0.9     0.97   Sodium 135 - 145 mmol/L 140  138     140   Potassium 3.5 - 5.1 mmol/L 4.2  3.7     4.2   Chloride 98 - 111 mmol/L 105  108     103   CO2 13 - 22  24     24    Calcium 8.7 - 10.7  8.5     9.3   Alkaline Phos 25 - 125  43       AST 14 - 40  23       ALT 10 - 40  15          This result is from an external source.   CT 04/30/2018 chest abdomen and pelvis -Surgical changes right lower lobe with moderate sized hydropneumothorax suspicious for small bronchopleural fistula -Moderate hiatal hernia    Carmell Austria, MD 03/14/2022, 9:24 AM  Cc: Raina Mina., MD  Dr. Hinton Rao

## 2022-03-14 NOTE — Op Note (Signed)
[place] Patient Name: Louis Barton Procedure Date: 03/14/2022 9:26 AM MRN: [id number] Endoscopist: Jackquline Denmark , MD Age: 81 Referring MD:  Date of Birth: 1940-11-21 Gender: Male Account #: [id number] Procedure:                Colonoscopy Indications:              High risk colon cancer surveillance: Personal                            history of colonic polyps. Elevated CEA Medicines:                Monitored Anesthesia Care Procedure:                Pre-Anesthesia Assessment:                           - Prior to the procedure, a History and Physical                            was performed, and patient medications and                            allergies were reviewed. The patient's tolerance of                            previous anesthesia was also reviewed. The risks                            and benefits of the procedure and the sedation                            options and risks were discussed with the patient.                            All questions were answered, and informed consent                            was obtained. Prior Anticoagulants: The patient has                            taken Eliquis (apixaban), last dose was 1 day prior                            to procedure. ASA Grade Assessment: III - A patient                            with severe systemic disease. After reviewing the                            risks and benefits, the patient was deemed in                            satisfactory condition to undergo the procedure.  After obtaining informed consent, the colonoscope                            was passed under direct vision. Throughout the                            procedure, the patient's blood pressure, pulse, and                            oxygen saturations were monitored continuously. The                            Olympus PCF-H190DL ([id number]) Colonoscope was                            introduced through  the anus and advanced to the 2                            cm into the ileum. The colonoscopy was performed                            without difficulty. The patient tolerated the                            procedure well. The quality of the bowel                            preparation was good. The terminal ileum, ileocecal                            valve, appendiceal orifice, and rectum were                            photographed. Scope In: 9:30:35 AM Scope Out: 2:87:86 AM Scope Withdrawal Time: 0 hours 12 minutes 35 seconds  Total Procedure Duration: 0 hours 17 minutes 36 seconds  Findings:                 The ileocecal valve was mildly lipomatous and                            prominent. Biopsies were taken with a cold forceps                            for histology.                           A few medium-mouthed diverticula were found in the                            sigmoid colon.                           Non-bleeding internal hemorrhoids were found during  retroflexion. The hemorrhoids were small and Grade                            I (internal hemorrhoids that do not prolapse).                           The terminal ileum appeared normal.                           The exam was otherwise without abnormality on                            direct and retroflexion views. Complications:            No immediate complications. Estimated Blood Loss:     Estimated blood loss: none. Impression:               - Lipomatous ileocecal valve. Biopsied.                           - Diverticulosis in the sigmoid colon.                           - Non-bleeding internal hemorrhoids.                           - The examined portion of the ileum was normal.                           - The examination was otherwise normal on direct                            and retroflexion views. Recommendation:           - Patient has a contact number available for                             emergencies. The signs and symptoms of potential                            delayed complications were discussed with the                            patient. Return to normal activities tomorrow.                            Written discharge instructions were provided to the                            patient.                           - Resume previous diet.                           - Continue present medications.                           -  Await pathology results.                           - Resume Eliquis (apixaban) at prior dose tomorrow.                           - The findings and recommendations were discussed                            with the patient's family. Jackquline Denmark, MD 03/14/2022 9:52:11 AM This report has been signed electronically.

## 2022-03-14 NOTE — Patient Instructions (Signed)
Information on diverticulosis and hemorrhoids given to you today.  Await pathology from the biopsies taken today.  Resume previous diet and medications.  Resume Eliquis at prior dose tomorrow.   YOU HAD AN ENDOSCOPIC PROCEDURE TODAY AT [facility]:   Refer to the procedure report that was given to you for any specific questions about what was found during the examination.  If the procedure report does not answer your questions, please call your gastroenterologist to clarify.  If you requested that your care partner not be given the details of your procedure findings, then the procedure report has been included in a sealed envelope for you to review at your convenience later.  YOU SHOULD EXPECT: Some feelings of bloating in the abdomen. Passage of more gas than usual.  Walking can help get rid of the air that was put into your GI tract during the procedure and reduce the bloating. If you had a lower endoscopy (such as a colonoscopy or flexible sigmoidoscopy) you may notice spotting of blood in your stool or on the toilet paper. If you underwent a bowel prep for your procedure, you may not have a normal bowel movement for a few days.  Please Note:  You might notice some irritation and congestion in your nose or some drainage.  This is from the oxygen used during your procedure.  There is no need for concern and it should clear up in a day or so.  SYMPTOMS TO REPORT IMMEDIATELY:  Following lower endoscopy (colonoscopy or flexible sigmoidoscopy):  Excessive amounts of blood in the stool  Significant tenderness or worsening of abdominal pains  Swelling of the abdomen that is new, acute  Fever of 100F or higher   For urgent or emergent issues, a gastroenterologist can be reached at any hour by calling [phone]. Do not use MyChart messaging for urgent concerns.    DIET:  We do recommend a small meal at first, but then you may proceed to your regular diet.  Drink plenty of  fluids but you should avoid alcoholic beverages for 24 hours.  ACTIVITY:  You should plan to take it easy for the rest of today and you should NOT DRIVE or use heavy machinery until tomorrow (because of the sedation medicines used during the test).    FOLLOW UP: Our staff will call the number listed on your records 24-72 hours following your procedure to check on you and address any questions or concerns that you may have regarding the information given to you following your procedure. If we do not reach you, we will leave a message.  We will attempt to reach you two times.  During this call, we will ask if you have developed any symptoms of COVID 19. If you develop any symptoms (ie: fever, flu-like symptoms, shortness of breath, cough etc.) before then, please call [phone].  If you test positive for Covid 19 in the 2 weeks post procedure, please call and report this information to [place].    If any biopsies were taken you will be contacted by phone or by letter within the next 1-3 weeks.  Please call us at [phone] if you have not heard about the biopsies in 3 weeks.    SIGNATURES/CONFIDENTIALITY: You and/or your care partner have signed paperwork which will be entered into your electronic medical record.  These signatures attest to the fact that that the information above on your After Visit Summary has been reviewed and is understood.  Full responsibility  of the confidentiality of this discharge information lies with you and/or your care-partner.

## 2022-03-15 ENCOUNTER — Telehealth: Payer: Self-pay | Admitting: *Deleted

## 2022-03-15 NOTE — Telephone Encounter (Signed)
  Follow up Call-     03/14/2022    8:08 AM  Call back number  Post procedure Call Back phone  # [phone]  Permission to leave phone message Yes     Patient questions:  Do you have a fever, pain , or abdominal swelling? No. Pain Score  0 *  Have you tolerated food without any problems? Yes.    Have you been able to return to your normal activities? Yes.    Do you have any questions about your discharge instructions: Diet   No. Medications  No. Follow up visit  No.  Do you have questions or concerns about your Care? No.  Actions: * If pain score is 4 or above: No action needed, pain <4.

## 2022-03-16 ENCOUNTER — Encounter: Payer: Self-pay | Admitting: Cardiology

## 2022-03-16 ENCOUNTER — Ambulatory Visit (INDEPENDENT_AMBULATORY_CARE_PROVIDER_SITE_OTHER): Payer: Medicare Other | Admitting: Cardiology

## 2022-03-16 VITALS — BP 104/70 | HR 82 | Ht 69.0 in | Wt 171.6 lb

## 2022-03-16 DIAGNOSIS — C3490 Malignant neoplasm of unspecified part of unspecified bronchus or lung: Secondary | ICD-10-CM

## 2022-03-16 DIAGNOSIS — Z950 Presence of cardiac pacemaker: Secondary | ICD-10-CM | POA: Diagnosis not present

## 2022-03-16 DIAGNOSIS — I739 Peripheral vascular disease, unspecified: Secondary | ICD-10-CM | POA: Diagnosis not present

## 2022-03-16 DIAGNOSIS — I483 Typical atrial flutter: Secondary | ICD-10-CM | POA: Diagnosis not present

## 2022-03-16 DIAGNOSIS — I5032 Chronic diastolic (congestive) heart failure: Secondary | ICD-10-CM

## 2022-03-16 DIAGNOSIS — I2510 Atherosclerotic heart disease of native coronary artery without angina pectoris: Secondary | ICD-10-CM

## 2022-03-16 LAB — CEA: CEA: 11.2

## 2022-03-16 NOTE — Progress Notes (Signed)
Cardiology Office Note:    Date:  03/16/2022   ID:  Louis Barton, DOB [birth year], MRN [id number]  PCP:  Raina Mina., MD  Cardiologist:  Jenne Campus, MD    Referring MD: Raina Mina., MD   Chief Complaint  Patient presents with   Follow-up    History of Present Illness:    Louis Barton is a 81 y.o. male   with quite complex past medical history.  That include coronary artery disease, mild nonobstructive, cardiac catheterization done in May 2022 showed 20% mid and distal circumflex as well as 20% of mid LAD lesion.  Also history of essential hypertension first-degree AV block, status post bilateral carotic endarterectomy, non-small cell lung cancer status post wedge resection and right lower lobe, remote history of TIA.  Recently he ended up being in [facility] because of episode of atrial flutter with slow ventricular rate.  He did have TEE done and then eventually electrical cardioversion.  Few months ago he came to hospital with confusion and suspicion for CVA/TIA has been raised however future evaluation showed found that he was positive for COVID-19.  Luckily recovered quite nicely.  While he was in the hospital he was bradycardic with 2-1 AV conduction.  He was eventually referred to EP team for consideration of pacemaker implantation.  Eventually end of November of last year he did have pacemaker implantation.  There was a problem with the lead to the activity will need to be revised after that he was noted to have typical atrial flutter and in March of this year he did have cardioversion and seems to maintain sinus rhythm since then.  He comes today to follow-up.  Tragically strike his wife who had severe arctic stenosis had TAVI done however shortly after that she got vascular collapse and she had perforated left ventricle surgically intervene however eventually she ended up passing.  Obviously shaken by that but seems to be tolerating distress quite well. He comes today to my  office for follow-up overall he said he is doing better.  But he complained of having weakness and fatigue especially in the afternoon.  His blood pressure today is very soft and I am worried maybe his weakness and fatigue is related to too much blood pressure medications.  He decided to go to gym trying very weak but is trying to do that he is concerned about his weight loss.  He is being seen by oncologist for elevated CEA so far work-up has been negative.  He does have history of lung cancer.  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 On imaging;   Cramps of left lower extremity-Calf  > right calf 01/01/2015   Degeneration of lumbar intervertebral disc 11/16/2015   Formatting of  this note might be different from the original. signficant DDD present with vacuum effect L4-5 and L5-S1 flat discs and anterior spurring noted   Dizziness 05/06/2020   Dyslipidemia 03/18/2015   Dyspnea on exertion 04/02/2020   Ear itch 05/14/2018   Edema 11/16/2015   Edema of both lower extremities due to peripheral venous insufficiency 02/03/2020   First degree AV block 10/20/2020   Heart failure ([place])    History of hiatal hernia    seen on last scan- as slight    History of thrombocytopenia    History of TIAs    Hyperlipidemia    Hypertension    Irritable bowel  syndrome 11/17/2016   rec trial of citrucel/ diet 11/16/2016 >>>      Local edema 02/07/2018   Malaise and fatigue 12/08/2020   Malignant neoplasm of left lung ([place]) 12/08/2017   Stage 1A. [place]. And Dr. 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 These slowly enlarging 9 mm right lower lobe pulmonary nodule along the right hemidiaphragm does not appear hypermetabolic today. Location adjacent to the hemidiaphragm can cause false negatives due to motion artifact related to the   Sprain of left ankle 05/21/2021   Stage 3a chronic kidney disease ([place]) 06/17/2020   Stroke ([place])    Swelling of limb-Left foot 01/01/2015   Vitamin B12 deficiency 06/21/2017    Past Surgical History:  Procedure Laterality Date   adenocarcinoma  2019   Removed   CARDIOVERSION N/A 03/22/2021   Procedure: CARDIOVERSION;  Surgeon: Lelon Perla, MD;  Location: [place];  Service: Cardiovascular;  Laterality: N/A;   CARDIOVERSION N/A 12/15/2021   Procedure: CARDIOVERSION;  Surgeon: Janina Mayo, MD;  Location: [place];  Service: Cardiovascular;  Laterality: N/A;   CAROTID  ENDARTERECTOMY  2005   Left carotid by Dr. Oneida Alar   CAROTID ENDARTERECTOMY  04/25/11   Right Carotid by Dr. Oneida Alar   COLONOSCOPY  02/23/2017   Colonic polyp status post polypectomy. Pancolonic diverticulosis predominatly in the sigmoid colon. Tubular adenoma   LEAD REVISION/REPAIR N/A 08/16/2021   Procedure: LEAD REVISION/REPAIR;  Surgeon: Constance Haw, MD;  Location: [place] [facility];  Service: Cardiovascular;  Laterality: N/A;   LEFT HEART CATH AND CORONARY ANGIOGRAPHY N/A 02/08/2021   Procedure: LEFT HEART CATH AND CORONARY ANGIOGRAPHY;  Surgeon: Burnell Blanks, MD;  Location: [place] [facility];  Service: Cardiovascular;  Laterality: N/A;   PACEMAKER IMPLANT N/A 07/28/2021   Procedure: PACEMAKER IMPLANT;  Surgeon: Constance Haw, MD;  Location: [place] [facility];  Service: Cardiovascular;  Laterality: N/A;   Septoplasty,  nasal w/ submucosal resection  1960   SQUAMOUS CELL CARCINOMA EXCISION     pt said numerous times   TEE WITHOUT CARDIOVERSION N/A 03/22/2021   Procedure: TRANSESOPHAGEAL ECHOCARDIOGRAM (TEE);  Surgeon: Lelon Perla, MD;  Location: [place];  Service: Cardiovascular;  Laterality: N/A;   TONSILLECTOMY     VIDEO ASSISTED THORACOSCOPY (VATS)/WEDGE RESECTION Right 11/09/2017   Procedure: RIGHT VIDEO ASSISTED THORACOSCOPY WITH Chales Salmon RESECTION;  Surgeon: [name], MD;  Location: [place];  Service: Thoracic;  Laterality: Right;    Current Medications: Current Meds  Medication Sig   acetaminophen (TYLENOL) 500 MG tablet Take 1,000 mg by mouth every 8 (eight) hours as needed for mild pain or moderate pain (for pain.).   amiodarone (PACERONE) 200 MG tablet Take 1 tablet (200 mg total) by mouth 2 (two) times daily.   apixaban (ELIQUIS) 5 MG TABS tablet Take 1 tablet (5 mg total) by mouth 2 (two) times daily. Resume 11/3 with the evening dose.   Cholecalciferol (VITAMIN D3) 50 MCG (2000 UT) TABS Take 4,000 Units by mouth every other day.    colesevelam (WELCHOL) 625 MG tablet Take 1,250 mg by mouth 2 (two) times daily with a meal.   Cyanocobalamin (VITAMIN B-12 IJ) Inject 1 each as directed every 30 (thirty) days.   furosemide (LASIX) 20 MG tablet TAKE 1 TABLET BY MOUTH DAILY. (Patient taking differently: Take 20 mg by mouth daily.)   Polyethyl Glycol-Propyl Glycol (SYSTANE ULTRA OP) Place 1 drop into both eyes daily. Unknown strength   potassium chloride (KLOR-CON) 10 MEQ tablet Take 20 mEq by mouth daily.   rosuvastatin (CRESTOR) 40 MG tablet Take 1 tablet (40 mg total) by mouth at bedtime.   valACYclovir (VALTREX) 1000 MG tablet Take 1,000 mg by mouth 2 (two) times daily as needed (for fever blisters.).    [DISCONTINUED] amLODipine (NORVASC) 2.5 MG tablet Take 2.5 mg by mouth daily.     Allergies:   Patient has no known allergies.   Social History   Socioeconomic History   Marital status: Widowed    Spouse name: Not on file   Number of children: 2   Years of education: Not on file   Highest education level: Not on file  Occupational History   Not on file  Tobacco Use   Smoking status: Former    Packs/day: 1.00    Years: 16.00    Total pack years: 16.00    Types: Cigarettes    Quit date: 09/26/1972    Years since quitting: 49.5   Smokeless tobacco: Never   Tobacco comments:    Former smoker 08/11/21  Vaping Use   Vaping Use: Never used  Substance and Sexual Activity   Alcohol use: Not Currently    Alcohol/week: 7.0 standard drinks of alcohol    Types: 7 Shots of liquor per week    Comment: 1 shot daily 08/11/21   Drug use: No   Sexual activity: Not on file  Other Topics Concern   Not on file  Social History Narrative   Not on file   Social Determinants of Health   Financial Resource Strain: Not on file  Food Insecurity: Not on file  Transportation Needs: Not on file  Physical Activity: Not on file  Stress: Not on file  Social Connections: Not on file     Family History: The patient's family  history includes Cancer in his brother and sister; Cancer (age of onset: 33) in his mother; Deep vein thrombosis in  his mother; Heart attack in his father and mother; Heart disease in his mother; Hyperlipidemia in his mother; Hypertension in his mother; Other in his mother and sister; Stroke in his mother; Varicose Veins in his mother. There is no history of Colon cancer, Esophageal cancer, Rectal cancer, or Stomach cancer. ROS:   Please see the history of present illness.    All 14 point review of systems negative except as described per history of present illness  EKGs/Labs/Other Studies Reviewed:      Recent Labs: 03/18/2021: B Natriuretic Peptide 187.1 05/05/2021: Magnesium 1.7; TSH 0.946 02/11/2022: ALT 20; BUN 14; Creatinine 1.0; Hemoglobin 14.8; Platelets 136; Potassium 4.1; Sodium 139  Recent Lipid Panel    Component Value Date/Time   CHOL 159 11/21/2018 0836   TRIG 82 11/21/2018 0836   HDL 59 11/21/2018 0836   CHOLHDL 2.7 11/21/2018 0836   LDLCALC 84 11/21/2018 0836    Physical Exam:    VS:  BP 104/70 (BP Location: Left Arm, Patient Position: Sitting)   Pulse 82   Ht 5\' 9"  (1.753 m)   Wt 171 lb 9.6 oz (77.8 kg)   BMI 25.34 kg/m     Wt Readings from Last 3 Encounters:  03/16/22 171 lb 9.6 oz (77.8 kg)  03/14/22 168 lb (76.2 kg)  03/01/22 168 lb (76.2 kg)     GEN:  Well nourished, well developed in no acute distress HEENT: Normal NECK: No JVD; No carotid bruits LYMPHATICS: No lymphadenopathy CARDIAC: RRR, no murmurs, no rubs, no gallops RESPIRATORY:  Clear to auscultation without rales, wheezing or rhonchi  ABDOMEN: Soft, non-tender, non-distended MUSCULOSKELETAL:  No edema; No deformity  SKIN: Warm and dry LOWER EXTREMITIES: no swelling NEUROLOGIC:  Alert and oriented x 3 PSYCHIATRIC:  Normal affect   ASSESSMENT:    1. Pacemaker   2. Chronic heart failure with preserved ejection fraction ([place])   3. PAD (peripheral artery disease) ([place])   4. Typical atrial  flutter ([place])   5. Arteriosclerosis of coronary artery   6. 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)    PLAN:    In order of problems listed above:  Pacemaker present interrogated normal function, last interrogation done in May 3 he did have episode of atrial fibrillation/atrial flutter however since he has been put on amiodarone seems to be tolerated quite well and arrhythmia control well.  We will continue present management which include anticoagulation Congestive heart failure seems to be stable he does have some swelling of lower extremities as usual.  I will discontinue amlodipine that should help. Typical atrial flutter denies having any recent palpitations Weight loss obviously concern he is being aggressively evaluated by oncology team so far work-up negative.  I suspect his weight loss is related to stress of his wife passing. Peripheral vascular disease stable   Medication Adjustments/Labs and Tests Ordered: Current medicines are reviewed at length with the patient today.  Concerns regarding medicines are outlined above.  Orders Placed This Encounter  Procedures   EKG 12-Lead   Medication changes: No orders of the defined types were placed in this encounter.   Signed, Park Liter, MD, [facility] 03/16/2022 2:00 PM    [place]

## 2022-03-16 NOTE — Patient Instructions (Signed)
Medication Instructions:  Your physician has recommended you make the following change in your medication:  Stop Norvasc  *If you need a refill on your cardiac medications before your next appointment, please call your pharmacy*   Lab Work: NONE If you have labs (blood work) drawn today and your tests are completely normal, you will receive your results only by: [place] (if you have MyChart) OR A paper copy in the mail If you have any lab test that is abnormal or we need to change your treatment, we will call you to review the results.   Testing/Procedures: NONE   Follow-Up: At [facility], you and your health needs are our priority.  As part of our continuing mission to provide you with exceptional heart care, we have created designated Provider Care Teams.  These Care Teams include your primary Cardiologist (physician) and Advanced Practice Providers (APPs -  Physician Assistants and Nurse Practitioners) who all work together to provide you with the care you need, when you need it.  We recommend signing up for the patient portal called "MyChart".  Sign up information is provided on this After Visit Summary.  MyChart is used to connect with patients for Virtual Visits (Telemedicine).  Patients are able to view lab/test results, encounter notes, upcoming appointments, etc.  Non-urgent messages can be sent to your provider as well.   To learn more about what you can do with MyChart, go to [email or web address].    Your next appointment:   4 week(s)  The format for your next appointment:   In Person  Provider:   Jenne Campus, MD    Other Instructions   Important Information About Sugar

## 2022-03-23 ENCOUNTER — Encounter: Payer: Self-pay | Admitting: Gastroenterology

## 2022-03-24 ENCOUNTER — Encounter: Payer: Medicare Other | Admitting: Gastroenterology

## 2022-03-25 ENCOUNTER — Ambulatory Visit (INDEPENDENT_AMBULATORY_CARE_PROVIDER_SITE_OTHER): Payer: Medicare Other | Admitting: Cardiology

## 2022-03-25 VITALS — BP 98/62 | HR 72

## 2022-03-25 DIAGNOSIS — C3490 Malignant neoplasm of unspecified part of unspecified bronchus or lung: Secondary | ICD-10-CM

## 2022-03-25 DIAGNOSIS — I739 Peripheral vascular disease, unspecified: Secondary | ICD-10-CM | POA: Diagnosis not present

## 2022-03-25 DIAGNOSIS — I5032 Chronic diastolic (congestive) heart failure: Secondary | ICD-10-CM | POA: Diagnosis not present

## 2022-03-25 DIAGNOSIS — R601 Generalized edema: Secondary | ICD-10-CM | POA: Diagnosis not present

## 2022-03-25 DIAGNOSIS — I2510 Atherosclerotic heart disease of native coronary artery without angina pectoris: Secondary | ICD-10-CM | POA: Diagnosis not present

## 2022-03-25 DIAGNOSIS — R42 Dizziness and giddiness: Secondary | ICD-10-CM

## 2022-03-25 NOTE — Progress Notes (Signed)
Cardiology Office Note:    Date:  03/25/2022   ID:  Louis Barton, DOB [birth year], MRN [id number]  PCP:  Raina Mina., MD  Cardiologist:  Jenne Campus, MD    Referring MD: Raina Mina., MD   No chief complaint on file. Dizzy  History of Present Illness:    Louis Barton is a 81 y.o. male   with quite complex past medical history.  That include coronary artery disease, mild nonobstructive, cardiac catheterization done in May 2022 showed 20% mid and distal circumflex as well as 20% of mid LAD lesion.  Also history of essential hypertension first-degree AV block, status post bilateral carotic endarterectomy, non-small cell lung cancer status post wedge resection and right lower lobe, remote history of TIA.  Recently he ended up being in [facility] because of episode of atrial flutter with slow ventricular rate.  He did have TEE done and then eventually electrical cardioversion.  Few months ago he came to hospital with confusion and suspicion for CVA/TIA has been raised however future evaluation showed found that he was positive for COVID-19.  Luckily recovered quite nicely.  While he was in the hospital he was bradycardic with 2-1 AV conduction.  He was eventually referred to EP team for consideration of pacemaker implantation.  Eventually end of November of last year he did have pacemaker implantation.  There was a problem with the lead to the activity will need to be revised after that he was noted to have typical atrial flutter and in March of this year he did have cardioversion and seems to maintain sinus rhythm since then.  He comes today to follow-up.  Tragically strike his wife who had severe arctic stenosis had TAVI done however shortly after that she got vascular collapse and she had perforated left ventricle surgically intervene however eventually she ended up passing.  Obviously shaken by that but seems to be tolerating distress quite well. He walks into my office today complaining  of not feeling well.  He was noted to have blood pressure being low.  He took himself and started taking double dose of diuretics he takes 20 mg of Lasix normally he started taking 40 mg for the last 3 days because of swelling of lower extremities swelling improved somewhat but not perfect.  He complained of having weakness fatigue dizziness also when getting up we did check his orthostatic changes today and it is positive.  Denies have any chest pain tightness squeezing pressure burning chest.  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 signficant DDD  present with vacuum effect L4-5 and L5-S1 flat discs and anterior spurring noted   Dizziness 05/06/2020   Dyslipidemia 03/18/2015   Dyspnea on exertion 04/02/2020   Ear itch 05/14/2018   Edema 11/16/2015   Edema of both lower extremities due to peripheral venous insufficiency 02/03/2020   First degree AV block 10/20/2020   Heart failure ([place])    History of hiatal hernia    seen on last scan- as slight    History of thrombocytopenia    History of TIAs    Hyperlipidemia    Hypertension    Irritable bowel  syndrome 11/17/2016   rec trial of citrucel/ diet 11/16/2016 >>>      Local edema 02/07/2018   Malaise and fatigue 12/08/2020   Malignant neoplasm of left lung ([place]) 12/08/2017   Stage 1A. [place]. And Dr. 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 These slowly enlarging 9 mm right lower lobe pulmonary nodule along the right hemidiaphragm does not appear hypermetabolic today. Location adjacent to the hemidiaphragm can cause false negatives due to motion artifact related to the   Sprain of left ankle 05/21/2021   Stage 3a chronic kidney disease ([place]) 06/17/2020   Stroke ([place])    Swelling of limb-Left foot 01/01/2015   Vitamin B12 deficiency 06/21/2017    Past Surgical History:  Procedure Laterality Date   adenocarcinoma  2019   Removed   CARDIOVERSION N/A 03/22/2021   Procedure: CARDIOVERSION;  Surgeon: Lelon Perla, MD;  Location: [place];  Service: Cardiovascular;  Laterality: N/A;   CARDIOVERSION N/A 12/15/2021   Procedure: CARDIOVERSION;  Surgeon: Janina Mayo, MD;  Location: [place];  Service: Cardiovascular;  Laterality: N/A;   CAROTID  ENDARTERECTOMY  2005   Left carotid by Dr. Oneida Alar   CAROTID ENDARTERECTOMY  04/25/11   Right Carotid by Dr. Oneida Alar   COLONOSCOPY  02/23/2017   Colonic polyp status post polypectomy. Pancolonic diverticulosis predominatly in the sigmoid colon. Tubular adenoma   LEAD REVISION/REPAIR N/A 08/16/2021   Procedure: LEAD REVISION/REPAIR;  Surgeon: Constance Haw, MD;  Location: [place] [facility];  Service: Cardiovascular;  Laterality: N/A;   LEFT HEART CATH AND CORONARY ANGIOGRAPHY N/A 02/08/2021   Procedure: LEFT HEART CATH AND CORONARY ANGIOGRAPHY;  Surgeon: Burnell Blanks, MD;  Location: [place] [facility];  Service: Cardiovascular;  Laterality: N/A;   PACEMAKER IMPLANT N/A 07/28/2021   Procedure: PACEMAKER IMPLANT;  Surgeon: Constance Haw, MD;  Location: [place] [facility];  Service: Cardiovascular;  Laterality: N/A;   Septoplasty, nasal w/ submucosal resection  1960   SQUAMOUS CELL  CARCINOMA EXCISION     pt said numerous times   TEE WITHOUT CARDIOVERSION N/A 03/22/2021   Procedure: TRANSESOPHAGEAL ECHOCARDIOGRAM (TEE);  Surgeon: Lelon Perla, MD;  Location: [place];  Service: Cardiovascular;  Laterality: N/A;   TONSILLECTOMY     VIDEO ASSISTED THORACOSCOPY (VATS)/WEDGE RESECTION Right 11/09/2017   Procedure: RIGHT VIDEO ASSISTED THORACOSCOPY WITH Chales Salmon RESECTION;  Surgeon: [name], MD;  Location: [place];  Service: Thoracic;  Laterality: Right;    Current Medications: No outpatient medications have been marked as taking for the 03/25/22 encounter (Office Visit) with Park Liter, MD.     Allergies:   Patient has no known allergies.   Social History   Socioeconomic History   Marital status: Widowed    Spouse name: Not on file   Number of children: 2   Years of education: Not on file   Highest education level: Not on file  Occupational History   Not on file  Tobacco Use   Smoking status: Former    Packs/day: 1.00    Years: 16.00     Total pack years: 16.00    Types: Cigarettes    Quit date: 09/26/1972    Years since quitting: 49.5   Smokeless tobacco: Never   Tobacco comments:    Former smoker 08/11/21  Vaping Use   Vaping Use: Never used  Substance and Sexual Activity   Alcohol use: Not Currently    Alcohol/week: 7.0 standard drinks of alcohol    Types: 7 Shots of liquor per week    Comment: 1 shot daily 08/11/21   Drug use: No   Sexual activity: Not on file  Other Topics Concern   Not on file  Social History Narrative   Not on file   Social Determinants of Health   Financial Resource Strain: Not on file  Food Insecurity: Not on file  Transportation Needs: Not on file  Physical Activity: Not on file  Stress: Not on file  Social Connections: Not on file     Family History: The patient's family history includes Cancer in his brother and sister; Cancer (age of onset: 57) in his mother; Deep vein thrombosis in his mother; Heart attack in his father and mother; Heart disease in his mother; Hyperlipidemia in his mother; Hypertension in his mother; Other in his mother and sister; Stroke in his mother; Varicose Veins in his mother. There is no history of Colon cancer, Esophageal cancer, Rectal cancer, or Stomach cancer. ROS:   Please see the history of present illness.    All 14 point review of systems negative except as described per history of present illness  EKGs/Labs/Other Studies Reviewed:      Recent Labs: 05/05/2021: Magnesium 1.7; TSH 0.946 02/11/2022: ALT 20; BUN 14; Creatinine 1.0; Hemoglobin 14.8; Platelets 136; Potassium 4.1; Sodium 139  Recent Lipid Panel    Component Value Date/Time   CHOL 159 11/21/2018 0836   TRIG 82 11/21/2018 0836   HDL 59 11/21/2018 0836   CHOLHDL 2.7 11/21/2018 0836   LDLCALC 84 11/21/2018 0836    Physical Exam:    VS:  BP 98/62 (BP Location: Left Arm, Patient Position: Sitting)   Pulse 72   SpO2 95%     Wt Readings from Last 3 Encounters:  03/16/22 171 lb  9.6 oz (77.8 kg)  03/14/22 168 lb (76.2 kg)  03/01/22 168 lb (76.2 kg)     GEN:  Well nourished, well developed in no acute distress HEENT: Normal NECK: No  JVD; No carotid bruits LYMPHATICS: No lymphadenopathy CARDIAC: RRR, no murmurs, no rubs, no gallops RESPIRATORY:  Clear to auscultation without rales, wheezing or rhonchi  ABDOMEN: Soft, non-tender, non-distended MUSCULOSKELETAL:  No edema; No deformity  SKIN: Warm and dry LOWER EXTREMITIES: no swelling NEUROLOGIC:  Alert and oriented x 3 PSYCHIATRIC:  Normal affect   ASSESSMENT:    1. Chronic heart failure with preserved ejection fraction ([place])   2. Generalized edema   3. Arteriosclerosis of coronary artery   4. PAD (peripheral artery disease) ([place])   5. Malignant neoplasm of bronchus and lung ([place])   6. Dizziness    PLAN:    In order of problems listed above:  Congestive heart failure minimal swelling of lower extremities.  I will ask him to go back to 20 mg of Lasix every single day, we will check his Chem-7 today.  He does have positive orthostatic changes which indicates dehydration. Atherosclerotic heart disease stable from that point review Dizziness I suspect is related to orthostatic hypotension.   Medication Adjustments/Labs and Tests Ordered: Current medicines are reviewed at length with the patient today.  Concerns regarding medicines are outlined above.  Orders Placed This Encounter  Procedures   Basic metabolic panel   Pro b natriuretic peptide (BNP)   ECHOCARDIOGRAM COMPLETE   Medication changes: No orders of the defined types were placed in this encounter.   Signed, Park Liter, MD, [facility] 03/25/2022 12:04 PM    [place]

## 2022-03-25 NOTE — Patient Instructions (Signed)
Medication Instructions:  Your physician has recommended you make the following change in your medication: Decrease Lasix to 20mg  daily    Lab Work: BMP, ProBNP Today If you have labs (blood work) drawn today and your tests are completely normal, you will receive your results only by: [place] (if you have MyChart) OR A paper copy in the mail If you have any lab test that is abnormal or we need to change your treatment, we will call you to review the results.   Testing/Procedures: Your physician has requested that you have an echocardiogram. Echocardiography is a painless test that uses sound waves to create images of your heart. It provides your doctor with information about the size and shape of your heart and how well your heart's chambers and valves are working. This procedure takes approximately one hour. There are no restrictions for this procedure.    Follow-Up: At [facility], you and your health needs are our priority.  As part of our continuing mission to provide you with exceptional heart care, we have created designated Provider Care Teams.  These Care Teams include your primary Cardiologist (physician) and Advanced Practice Providers (APPs -  Physician Assistants and Nurse Practitioners) who all work together to provide you with the care you need, when you need it.  We recommend signing up for the patient portal called "MyChart".  Sign up information is provided on this After Visit Summary.  MyChart is used to connect with patients for Virtual Visits (Telemedicine).  Patients are able to view lab/test results, encounter notes, upcoming appointments, etc.  Non-urgent messages can be sent to your provider as well.   To learn more about what you can do with MyChart, go to [email or web address].    Your next appointment:   As scheduled  The format for your next appointment:   In Person  Provider:   Jenne Campus, MD    Other Instructions NA

## 2022-03-26 LAB — BASIC METABOLIC PANEL
BUN/Creatinine Ratio: 13 (ref 10–24)
BUN: 14 mg/dL (ref 8–27)
CO2: 26 mmol/L (ref 20–29)
Calcium: 9 mg/dL (ref 8.6–10.2)
Chloride: 101 mmol/L (ref 96–106)
Creatinine, Ser: 1.04 mg/dL (ref 0.76–1.27)
Glucose: 85 mg/dL (ref 70–99)
Potassium: 4.5 mmol/L (ref 3.5–5.2)
Sodium: 141 mmol/L (ref 134–144)
eGFR: 71 mL/min/{1.73_m2} (ref >59)

## 2022-03-26 LAB — PRO B NATRIURETIC PEPTIDE: NT-Pro BNP: 638 pg/mL — ABNORMAL HIGH (ref 0–486)

## 2022-03-30 ENCOUNTER — Telehealth: Payer: Self-pay

## 2022-03-30 NOTE — Telephone Encounter (Signed)
Patient notified

## 2022-03-30 NOTE — Telephone Encounter (Signed)
-----   Message from Park Liter, MD sent at 03/26/2022 11:42 AM EDT ----- There is any evidence of CHF, please wait for results of echocardiogram

## 2022-04-01 ENCOUNTER — Other Ambulatory Visit: Payer: Medicare Other

## 2022-04-04 ENCOUNTER — Other Ambulatory Visit: Payer: Self-pay | Admitting: *Deleted

## 2022-04-04 DIAGNOSIS — I4819 Other persistent atrial fibrillation: Secondary | ICD-10-CM

## 2022-04-04 DIAGNOSIS — I483 Typical atrial flutter: Secondary | ICD-10-CM

## 2022-04-04 MED ORDER — APIXABAN 5 MG PO TABS: 5.0000 mg | ORAL_TABLET | Freq: Two times a day (BID) | ORAL | 2 refills | Status: DC

## 2022-04-04 NOTE — Telephone Encounter (Signed)
Eliquis 5mg  refill request received. Patient is 81 years old, weight-77.8kg, Crea-1.04 on 03/25/2022, Diagnosis-CVA & Afib/flutter, and last seen by [name] on 03/16/2022. Dose is appropriate based on dosing criteria. Will send in refill to requested pharmacy.

## 2022-04-13 ENCOUNTER — Ambulatory Visit (INDEPENDENT_AMBULATORY_CARE_PROVIDER_SITE_OTHER): Payer: Medicare Other

## 2022-04-13 DIAGNOSIS — I5032 Chronic diastolic (congestive) heart failure: Secondary | ICD-10-CM | POA: Diagnosis not present

## 2022-04-13 DIAGNOSIS — R601 Generalized edema: Secondary | ICD-10-CM | POA: Diagnosis not present

## 2022-04-13 LAB — ECHOCARDIOGRAM COMPLETE
Area-P 1/2: 4.39 cm2
MV M vel: 4.89 m/s
MV Peak grad: 95.6 mmHg
Radius: 0.4 cm
S' Lateral: 3.2 cm

## 2022-04-19 DIAGNOSIS — R269 Unspecified abnormalities of gait and mobility: Secondary | ICD-10-CM | POA: Insufficient documentation

## 2022-04-19 HISTORY — DX: Unspecified abnormalities of gait and mobility: R26.9

## 2022-04-22 ENCOUNTER — Ambulatory Visit (INDEPENDENT_AMBULATORY_CARE_PROVIDER_SITE_OTHER): Payer: Medicare Other | Admitting: Cardiology

## 2022-04-22 ENCOUNTER — Encounter: Payer: Self-pay | Admitting: Cardiology

## 2022-04-22 VITALS — BP 130/78 | HR 68 | Ht 68.0 in | Wt 173.0 lb

## 2022-04-22 DIAGNOSIS — I2510 Atherosclerotic heart disease of native coronary artery without angina pectoris: Secondary | ICD-10-CM | POA: Diagnosis not present

## 2022-04-22 DIAGNOSIS — E7800 Pure hypercholesterolemia, unspecified: Secondary | ICD-10-CM

## 2022-04-22 DIAGNOSIS — I739 Peripheral vascular disease, unspecified: Secondary | ICD-10-CM

## 2022-04-22 DIAGNOSIS — I483 Typical atrial flutter: Secondary | ICD-10-CM

## 2022-04-22 NOTE — Progress Notes (Signed)
Cardiology Office Note:    Date:  04/22/2022   ID:  SUHAAS AGENA, DOB 04-Oct-1940, MRN [id number]  PCP:  Raina Mina., MD  Cardiologist:  Jenne Campus, MD    Referring MD: Raina Mina., MD   Chief Complaint  Patient presents with   Medication Management    History of Present Illness:    Louis Barton is a 81 y.o. male with quite complex past medical history.  That include coronary artery disease, mild nonobstructive, cardiac catheterization done in May 2022 showed 20% mid and distal circumflex as well as 20% of mid LAD lesion.  Also history of essential hypertension first-degree AV block, status post bilateral carotic endarterectomy, non-small cell lung cancer status post wedge resection and right lower lobe, remote history of TIA.  Recently he ended up being in [facility] because of episode of atrial flutter with slow ventricular rate.  He did have TEE done and then eventually electrical cardioversion.  Few months ago he came to hospital with confusion and suspicion for CVA/TIA has been raised however future evaluation showed found that he was positive for COVID-19.  Luckily recovered quite nicely.  While he was in the hospital he was bradycardic with 2-1 AV conduction.  He was eventually referred to EP team for consideration of pacemaker implantation.  Eventually end of November of last year he did have pacemaker implantation.  There was a problem with the lead to the activity will need to be revised after that he was noted to have typical atrial flutter and in March of this year he did have cardioversion and seems to maintain sinus rhythm since then.  He comes today to follow-up.  Tragically strike his wife who had severe arctic stenosis had TAVI done however shortly after that she got vascular collapse and she had perforated left ventricle surgically intervene however eventually she ended up passing.  Obviously shaken by that but seems to be tolerating distress quite well. He comes  today 2 months of follow-up.  Overall he said he got good days and bad days.  Recently he started to sell his house and is very stressful obviously he is also stressed out about his wife passing ,  he is very weak tired exhausted and sometimes dizzy last time I stopped his amlodipine because of blood pressure being too low and I was concerned maybe that is what because the problem he seems to be doing fine with blood pressure except for last 2 days blood pressure is elevated.  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 On imaging;   Cramps of left lower extremity-Calf  > right calf 01/01/2015   Degeneration of lumbar intervertebral disc  11/16/2015   Formatting of this note might be different from the original. signficant DDD present with vacuum effect L4-5 and L5-S1 flat discs and anterior spurring noted   Dizziness 05/06/2020   Dyslipidemia 03/18/2015   Dyspnea on exertion 04/02/2020   Ear itch 05/14/2018   Edema 11/16/2015   Edema of both lower extremities due to peripheral venous insufficiency 02/03/2020   First degree AV block 10/20/2020   Heart failure ([place])    History of hiatal hernia    seen on last scan- as slight    History of thrombocytopenia     History of TIAs    Hyperlipidemia    Hypertension    Irritable bowel syndrome 11/17/2016   rec trial of citrucel/ diet 11/16/2016 >>>      Local edema 02/07/2018   Malaise and fatigue 12/08/2020   Malignant neoplasm of left lung ([place]) 12/08/2017   Stage 1A. [place]. And Dr. 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 These slowly enlarging 9 mm right lower lobe pulmonary nodule along the right hemidiaphragm does not appear hypermetabolic today. Location adjacent to the hemidiaphragm can cause false negatives due to motion artifact related to the   Sprain of left ankle 05/21/2021   Stage 3a chronic kidney disease ([place]) 06/17/2020   Stroke ([place])    Swelling of limb-Left foot 01/01/2015   Vitamin B12 deficiency 06/21/2017    Past Surgical History:  Procedure Laterality Date   adenocarcinoma  2019   Removed   CARDIOVERSION N/A 03/22/2021   Procedure: CARDIOVERSION;  Surgeon: Lelon Perla, MD;  Location: [place];  Service: Cardiovascular;  Laterality: N/A;   CARDIOVERSION N/A 12/15/2021   Procedure: CARDIOVERSION;  Surgeon: Janina Mayo, MD;  Location: [place];  Service: Cardiovascular;  Laterality: N/A;   CAROTID ENDARTERECTOMY  2005   Left carotid by Dr. Oneida Alar   CAROTID ENDARTERECTOMY  04/25/11   Right Carotid by Dr. Oneida Alar   COLONOSCOPY  02/23/2017   Colonic polyp status post polypectomy. Pancolonic diverticulosis predominatly in the sigmoid colon. Tubular adenoma   LEAD REVISION/REPAIR N/A 08/16/2021   Procedure: LEAD REVISION/REPAIR;  Surgeon: Constance Haw, MD;  Location: [place] [facility];  Service: Cardiovascular;  Laterality: N/A;   LEFT HEART CATH AND CORONARY ANGIOGRAPHY N/A 02/08/2021   Procedure: LEFT HEART CATH AND CORONARY ANGIOGRAPHY;  Surgeon: Burnell Blanks, MD;  Location: [place] [facility];  Service: Cardiovascular;  Laterality: N/A;   PACEMAKER IMPLANT N/A 07/28/2021   Procedure: PACEMAKER IMPLANT;  Surgeon: Constance Haw, MD;  Location: [place] [facility];  Service: Cardiovascular;  Laterality: N/A;   Septoplasty, nasal w/ submucosal resection  1960   SQUAMOUS CELL CARCINOMA EXCISION     pt said numerous times   TEE WITHOUT CARDIOVERSION N/A 03/22/2021   Procedure: TRANSESOPHAGEAL ECHOCARDIOGRAM (TEE);  Surgeon: Lelon Perla, MD;  Location: [place];  Service: Cardiovascular;  Laterality: N/A;   TONSILLECTOMY     VIDEO ASSISTED THORACOSCOPY (VATS)/WEDGE RESECTION Right 11/09/2017   Procedure: RIGHT VIDEO ASSISTED THORACOSCOPY WITH Chales Salmon RESECTION;  Surgeon: [name], MD;  Location: [place];  Service: Thoracic;  Laterality: Right;    Current Medications: Current Meds  Medication Sig   acetaminophen (TYLENOL) 500 MG tablet Take 1,000 mg by mouth every 8 (eight) hours as needed for mild pain or moderate pain (for pain.).   amiodarone (PACERONE) 200 MG tablet Take 1 tablet (200 mg total) by mouth 2 (two) times daily.   apixaban (ELIQUIS) 5 MG TABS tablet Take 1 tablet (5 mg total) by mouth 2 (two) times daily.   Cholecalciferol (VITAMIN D3) 50 MCG (2000 UT) TABS Take 4,000  Units by mouth every other day.   colesevelam (WELCHOL) 625 MG tablet Take 1,250 mg by mouth 2 (two) times daily with a meal.   Cyanocobalamin (VITAMIN B-12 IJ) Inject 1 each as directed every 30 (thirty) days.   furosemide (LASIX) 20 MG tablet TAKE 1 TABLET BY MOUTH DAILY. (Patient taking differently: Take 20 mg by mouth daily.)   Polyethyl Glycol-Propyl Glycol (SYSTANE ULTRA OP) Place 1 drop into both eyes daily. Unknown strength   potassium chloride (KLOR-CON) 10 MEQ tablet Take 20 mEq by mouth daily.   rosuvastatin (CRESTOR) 40 MG tablet Take 1 tablet (40 mg total) by mouth at bedtime.   valACYclovir (VALTREX) 1000 MG tablet Take 1,000 mg by mouth 2 (two) times daily as needed (for fever blisters.).      Allergies:   Patient has no known allergies.   Social History   Socioeconomic History   Marital status: Widowed    Spouse name: Not on file   Number of children: 2   Years of education: Not on file   Highest education level: Not on file  Occupational History   Not on file  Tobacco Use   Smoking status: Former    Packs/day: 1.00    Years: 16.00    Total pack years: 16.00    Types: Cigarettes    Quit date: 09/26/1972    Years since quitting: 49.6   Smokeless tobacco: Never   Tobacco comments:    Former smoker 08/11/21  Vaping Use   Vaping Use: Never used  Substance and Sexual Activity   Alcohol use: Not Currently    Alcohol/week: 7.0 standard drinks of alcohol    Types: 7 Shots of liquor per week    Comment: 1 shot daily 08/11/21   Drug use: No   Sexual activity: Not on file  Other Topics Concern   Not on file  Social History Narrative   Not on file   Social Determinants of Health   Financial Resource Strain: Not on file  Food Insecurity: Not on file  Transportation Needs: Not on file  Physical Activity: Not on file  Stress: Not on file  Social Connections: Not on file     Family History: The patient's family history includes Cancer in his brother and sister;  Cancer (age of onset: 30) in his mother; Deep vein thrombosis in his mother; Heart attack in his father and mother; Heart disease in his mother; Hyperlipidemia  in his mother; Hypertension in his mother; Other in his mother and sister; Stroke in his mother; Varicose Veins in his mother. There is no history of Colon cancer, Esophageal cancer, Rectal cancer, or Stomach cancer. ROS:   Please see the history of present illness.    All 14 point review of systems negative except as described per history of present illness  EKGs/Labs/Other Studies Reviewed:      Recent Labs: 05/05/2021: Magnesium 1.7; TSH 0.946 02/11/2022: ALT 20; Hemoglobin 14.8; Platelets 136 03/25/2022: BUN 14; Creatinine, Ser 1.04; NT-Pro BNP 638; Potassium 4.5; Sodium 141  Recent Lipid Panel    Component Value Date/Time   CHOL 159 11/21/2018 0836   TRIG 82 11/21/2018 0836   HDL 59 11/21/2018 0836   CHOLHDL 2.7 11/21/2018 0836   LDLCALC 84 11/21/2018 0836    Physical Exam:    VS:  BP 130/78 (BP Location: Left Arm, Patient Position: Sitting)   Pulse 68   Ht 5\' 8"  (1.727 m)   Wt 173 lb (78.5 kg)   SpO2 96%   BMI 26.30 kg/m     Wt Readings from Last 3 Encounters:  04/22/22 173 lb (78.5 kg)  03/16/22 171 lb 9.6 oz (77.8 kg)  03/14/22 168 lb (76.2 kg)     GEN:  Well nourished, well developed in no acute distress HEENT: Normal NECK: No JVD; No carotid bruits LYMPHATICS: No lymphadenopathy CARDIAC: RRR, no murmurs, no rubs, no gallops RESPIRATORY:  Clear to auscultation without rales, wheezing or rhonchi  ABDOMEN: Soft, non-tender, non-distended MUSCULOSKELETAL:  No edema; No deformity  SKIN: Warm and dry LOWER EXTREMITIES: no swelling NEUROLOGIC:  Alert and oriented x 3 PSYCHIATRIC:  Normal affect   ASSESSMENT:    1. Typical atrial flutter ([place])   2. Coronary artery disease involving native coronary artery of native heart without angina pectoris   3. PAD (peripheral artery disease) ([place])   4. Pure  hypercholesterolemia    PLAN:    In order of problems listed above:  Typical atrial flutter.  Anticoagulated which I will continue.  Awaiting interrogation of his device which will be done in few days based on that we decide what to do amiodarone either increase or decrease the dose of the medication Coronary disease stable from that point review Peripheral vascular disease stable Pacemaker present again waiting for interrogation I did review last interrogation from May normal function. High risk medication use amiodarone TSH checked normal in 26 July just 2 days ago liver function test normal Dyslipidemia I did review his blood test done by primary care physician LDL 83 HDL 64 we will continue present management for now   Medication Adjustments/Labs and Tests Ordered: Current medicines are reviewed at length with the patient today.  Concerns regarding medicines are outlined above.  No orders of the defined types were placed in this encounter.  Medication changes: No orders of the defined types were placed in this encounter.   Signed, Park Liter, MD, [facility] 04/22/2022 1:23 PM    [place]

## 2022-04-22 NOTE — Patient Instructions (Signed)
Medication Instructions:  Your physician recommends that you continue on your current medications as directed. Please refer to the Current Medication list given to you today.  *If you need a refill on your cardiac medications before your next appointment, please call your pharmacy*   Lab Work: None If you have labs (blood work) drawn today and your tests are completely normal, you will receive your results only by: [place] (if you have MyChart) OR A paper copy in the mail If you have any lab test that is abnormal or we need to change your treatment, we will call you to review the results.   Testing/Procedures: None   Follow-Up: At [facility], you and your health needs are our priority.  As part of our continuing mission to provide you with exceptional heart care, we have created designated Provider Care Teams.  These Care Teams include your primary Cardiologist (physician) and Advanced Practice Providers (APPs -  Physician Assistants and Nurse Practitioners) who all work together to provide you with the care you need, when you need it.  We recommend signing up for the patient portal called "MyChart".  Sign up information is provided on this After Visit Summary.  MyChart is used to connect with patients for Virtual Visits (Telemedicine).  Patients are able to view lab/test results, encounter notes, upcoming appointments, etc.  Non-urgent messages can be sent to your provider as well.   To learn more about what you can do with MyChart, go to [email or web address].    Your next appointment:   4 month(s)  The format for your next appointment:   In Person  Provider:   Jenne Campus, MD    Other Instructions   Important Information About Sugar

## 2022-04-27 LAB — CUP PACEART REMOTE DEVICE CHECK
Battery Remaining Longevity: 126 mo
Battery Voltage: 3.05 V
Brady Statistic AP VP Percent: 96.22 %
Brady Statistic AP VS Percent: 0.18 %
Brady Statistic AS VP Percent: 3.55 %
Brady Statistic AS VS Percent: 0.05 %
Brady Statistic RA Percent Paced: 96.31 %
Brady Statistic RV Percent Paced: 99.77 %
Date Time Interrogation Session: 20230801230325
Implantable Lead Implant Date: 20221102
Implantable Lead Implant Date: 20221121
Implantable Lead Location: 753859
Implantable Lead Location: 753860
Implantable Lead Model: 3830
Implantable Lead Model: 5076
Implantable Pulse Generator Implant Date: 20221102
Lead Channel Impedance Value: 266 Ohm
Lead Channel Impedance Value: 342 Ohm
Lead Channel Impedance Value: 418 Ohm
Lead Channel Impedance Value: 475 Ohm
Lead Channel Pacing Threshold Amplitude: 0.875 V
Lead Channel Pacing Threshold Amplitude: 1.25 V
Lead Channel Pacing Threshold Pulse Width: 0.4 ms
Lead Channel Pacing Threshold Pulse Width: 0.4 ms
Lead Channel Sensing Intrinsic Amplitude: 0.75 mV
Lead Channel Sensing Intrinsic Amplitude: 0.75 mV
Lead Channel Sensing Intrinsic Amplitude: 10.625 mV
Lead Channel Sensing Intrinsic Amplitude: 10.625 mV
Lead Channel Setting Pacing Amplitude: 2 V
Lead Channel Setting Pacing Amplitude: 2.5 V
Lead Channel Setting Pacing Pulse Width: 0.4 ms
Lead Channel Setting Sensing Sensitivity: 0.9 mV

## 2022-04-28 ENCOUNTER — Ambulatory Visit (INDEPENDENT_AMBULATORY_CARE_PROVIDER_SITE_OTHER): Payer: Medicare Other

## 2022-04-28 DIAGNOSIS — I459 Conduction disorder, unspecified: Secondary | ICD-10-CM | POA: Diagnosis not present

## 2022-05-19 NOTE — Progress Notes (Signed)
Remote pacemaker transmission.   

## 2022-06-20 ENCOUNTER — Encounter: Payer: Self-pay | Admitting: Cardiology

## 2022-06-20 ENCOUNTER — Ambulatory Visit: Payer: Medicare Other | Attending: Cardiology | Admitting: Cardiology

## 2022-06-20 VITALS — BP 142/84 | HR 75 | Ht 68.5 in | Wt 170.0 lb

## 2022-06-20 DIAGNOSIS — D6869 Other thrombophilia: Secondary | ICD-10-CM | POA: Diagnosis present

## 2022-06-20 DIAGNOSIS — I2510 Atherosclerotic heart disease of native coronary artery without angina pectoris: Secondary | ICD-10-CM

## 2022-06-20 DIAGNOSIS — I483 Typical atrial flutter: Secondary | ICD-10-CM | POA: Insufficient documentation

## 2022-06-20 DIAGNOSIS — I441 Atrioventricular block, second degree: Secondary | ICD-10-CM | POA: Insufficient documentation

## 2022-06-20 DIAGNOSIS — R001 Bradycardia, unspecified: Secondary | ICD-10-CM | POA: Insufficient documentation

## 2022-06-20 LAB — CUP PACEART INCLINIC DEVICE CHECK
Battery Remaining Longevity: 133 mo
Battery Voltage: 3.04 V
Brady Statistic AP VP Percent: 94.45 %
Brady Statistic AP VS Percent: 0.25 %
Brady Statistic AS VP Percent: 5.21 %
Brady Statistic AS VS Percent: 0.09 %
Brady Statistic RA Percent Paced: 90.57 %
Brady Statistic RV Percent Paced: 99.67 %
Date Time Interrogation Session: 20230925090951
Implantable Lead Implant Date: 20221102
Implantable Lead Implant Date: 20221121
Implantable Lead Location: 753859
Implantable Lead Location: 753860
Implantable Lead Model: 3830
Implantable Lead Model: 5076
Implantable Pulse Generator Implant Date: 20221102
Lead Channel Impedance Value: 285 Ohm
Lead Channel Impedance Value: 380 Ohm
Lead Channel Impedance Value: 475 Ohm
Lead Channel Impedance Value: 513 Ohm
Lead Channel Pacing Threshold Amplitude: 0.875 V
Lead Channel Pacing Threshold Amplitude: 1 V
Lead Channel Pacing Threshold Pulse Width: 0.4 ms
Lead Channel Pacing Threshold Pulse Width: 0.4 ms
Lead Channel Sensing Intrinsic Amplitude: 1 mV
Lead Channel Sensing Intrinsic Amplitude: 1.25 mV
Lead Channel Sensing Intrinsic Amplitude: 10.625 mV
Lead Channel Sensing Intrinsic Amplitude: 10.625 mV
Lead Channel Setting Pacing Amplitude: 2 V
Lead Channel Setting Pacing Amplitude: 2 V
Lead Channel Setting Pacing Pulse Width: 0.4 ms
Lead Channel Setting Sensing Sensitivity: 0.9 mV

## 2022-06-20 NOTE — Patient Instructions (Signed)
Medication Instructions:  Your physician recommends that you continue on your current medications as directed. Please refer to the Current Medication list given to you today.  *If you need a refill on your cardiac medications before your next appointment, please call your pharmacy*   Lab Work: None ordered   Testing/Procedures: None ordered   Follow-Up: At [facility], you and your health needs are our priority.  As part of our continuing mission to provide you with exceptional heart care, we have created designated Provider Care Teams.  These Care Teams include your primary Cardiologist (physician) and Advanced Practice Providers (APPs -  Physician Assistants and Nurse Practitioners) who all work together to provide you with the care you need, when you need it.  Remote monitoring is used to monitor your Pacemaker or ICD from home. This monitoring reduces the number of office visits required to check your device to one time per year. It allows [place] to keep an eye on the functioning of your device to ensure it is working properly. You are scheduled for a device check from home on 07/28/22. You may send your transmission at any time that day. If you have a wireless device, the transmission will be sent automatically. After your physician reviews your transmission, you will receive a postcard with your next transmission date.  Your next appointment:   1 year(s)  The format for your next appointment:   In Person  Provider:   Allegra Lai, MD    Thank you for choosing [facility]!!   Trinidad Curet, RN [phone]    Other Instructions  Important Information About Sugar

## 2022-06-20 NOTE — Progress Notes (Signed)
Electrophysiology Office Note   Date:  06/20/2022   ID:  Louis Barton, DOB 1941/04/07, MRN [id number]  PCP:  Raina Mina., MD  Cardiologist: Agustin Cree Primary Electrophysiologist:  Quinnten Calvin Meredith Leeds, MD    Chief Complaint: Fatigue   History of Present Illness: Louis Barton is a 81 y.o. male who is being seen today for the evaluation of second-degree heart block at the request of Raina Mina., MD. Presenting today for electrophysiology evaluation.  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  He presented [facility] with atrial flutter and slow ventricular response.    He was hospitalized in 2020 with confusion.  He was noted to be in 2 1 AV block.  Diagnosed with COVID at the time.  He is status post Medtronic dual-chamber pacemaker implanted 07/28/2021 with complete revision on 08/16/2021 due to loss of RV capture.  He is currently on amiodarone for his atrial flutter.  Today, denies symptoms of palpitations, chest pain, shortness of breath, orthopnea, PND, lower extremity edema, claudication, dizziness, presyncope, syncope, bleeding, or neurologic sequela. The patient is tolerating medications without difficulties.  He currently feels well.  He has no chest pain or shortness of breath.  He is able to all of his daily activities.  He has had no further atrial arrhythmias.  He feels much improved in normal rhythm.  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 signficant DDD present with vacuum effect L4-5 and L5-S1 flat discs and anterior spurring noted   Dizziness 05/06/2020   Dyslipidemia 03/18/2015   Dyspnea on exertion 04/02/2020   Ear itch 05/14/2018   Edema 11/16/2015   Edema of both lower extremities due to peripheral venous insufficiency 02/03/2020   First degree AV block 10/20/2020   Heart failure ([place])    History of hiatal hernia    seen on last scan- as slight    History of thrombocytopenia    History of TIAs    Hyperlipidemia    Hypertension    Irritable bowel syndrome 11/17/2016   rec trial of citrucel/ diet 11/16/2016 >>>      Local edema 02/07/2018   Malaise and fatigue 12/08/2020   Malignant neoplasm of left lung ([place]) 12/08/2017   Stage 1A. [place]. And Dr. 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 Renal parapelvic cysts 10/06/16 on CT , right kidney midpole 1.2 cm, right kidney anterior pole 1.1 cm, and to the left kidney lower pole anterior position 3.5cm all felt to be benign   Right bundle branch block 10/20/2020   Right lower lobe pulmonary nodule 11/09/2017   Shingles    internal- obstruction of bowels & gallbladder   Solitary pulmonary nodule 11/16/2016   CT ABd 03/24/09  linerar densities in RLL and RML c/w scarring o/w clear CT Abd 10/20/16 c/w 7 mm nodule  - CT chest 02/13/2017 :  slt enlarged to 9 mm   - PET 04/04/17 :1. These slowly enlarging 9 mm right lower lobe pulmonary nodule along the right hemidiaphragm does not appear hypermetabolic today. Location adjacent to the hemidiaphragm can cause false negatives due to motion artifact related to the   Sprain of left ankle 05/21/2021   Stage 3a chronic kidney disease ([place]) 06/17/2020   Stroke ([place])    Swelling of limb-Left foot 01/01/2015   Vitamin B12 deficiency 06/21/2017   Past Surgical History:  Procedure Laterality Date   adenocarcinoma  2019   Removed   CARDIOVERSION N/A 03/22/2021   Procedure: CARDIOVERSION;  Surgeon: Lelon Perla, MD;  Location: [place];  Service: Cardiovascular;  Laterality: N/A;   CARDIOVERSION N/A 12/15/2021   Procedure: CARDIOVERSION;  Surgeon: Janina Mayo, MD;  Location: [place];  Service: Cardiovascular;  Laterality: N/A;   CAROTID ENDARTERECTOMY  2005   Left carotid by Dr. Oneida Alar   CAROTID ENDARTERECTOMY  04/25/11   Right Carotid by Dr. Oneida Alar   COLONOSCOPY  02/23/2017   Colonic polyp status post polypectomy. Pancolonic diverticulosis predominatly in the sigmoid colon. Tubular adenoma   LEAD REVISION/REPAIR N/A 08/16/2021   Procedure: LEAD REVISION/REPAIR;  Surgeon: Constance Haw, MD;  Location: [place] [facility];  Service: Cardiovascular;  Laterality: N/A;   LEFT HEART CATH AND CORONARY ANGIOGRAPHY N/A 02/08/2021   Procedure: LEFT HEART CATH AND CORONARY ANGIOGRAPHY;  Surgeon: Burnell Blanks, MD;  Location: [place] [facility];  Service: Cardiovascular;  Laterality: N/A;   PACEMAKER IMPLANT N/A 07/28/2021   Procedure: PACEMAKER IMPLANT;  Surgeon: Constance Haw, MD;  Location: [place] [facility];  Service: Cardiovascular;  Laterality: N/A;   Septoplasty, nasal w/ submucosal resection  1960   SQUAMOUS CELL CARCINOMA EXCISION     pt said  numerous times   TEE WITHOUT CARDIOVERSION N/A 03/22/2021   Procedure: TRANSESOPHAGEAL ECHOCARDIOGRAM (TEE);  Surgeon: Lelon Perla, MD;  Location: [place];  Service: Cardiovascular;  Laterality: N/A;   TONSILLECTOMY     VIDEO ASSISTED THORACOSCOPY (VATS)/WEDGE RESECTION Right 11/09/2017   Procedure: RIGHT VIDEO ASSISTED THORACOSCOPY WITH Chales Salmon RESECTION;  Surgeon: [name], MD;  Location: [facility];  Service: Thoracic;  Laterality: Right;     Current Outpatient Medications  Medication Sig Dispense Refill   acetaminophen (TYLENOL) 500 MG tablet Take 1,000 mg by mouth every 8 (eight) hours as needed for mild pain or moderate pain (for pain.).     amiodarone (PACERONE) 200 MG tablet Take 200 mg by mouth daily.     amoxicillin (AMOXIL) 500 MG capsule Take 500 mg by mouth 3 (three) times daily.     apixaban (ELIQUIS) 5 MG TABS tablet Take 1 tablet (5 mg total) by mouth 2 (two) times daily. 180 tablet 2   Cholecalciferol (VITAMIN D3)  50 MCG (2000 UT) TABS Take 4,000 Units by mouth every other day.     colesevelam (WELCHOL) 625 MG tablet Take 1,250 mg by mouth 2 (two) times daily with a meal.     Cyanocobalamin (VITAMIN B-12 IJ) Inject 1 each as directed every 30 (thirty) days.     furosemide (LASIX) 20 MG tablet TAKE 1 TABLET BY MOUTH DAILY. (Patient taking differently: Take 20 mg by mouth daily.) 90 tablet 2   Polyethyl Glycol-Propyl Glycol (SYSTANE ULTRA OP) Place 1 drop into both eyes daily. Unknown strength     potassium chloride (KLOR-CON) 10 MEQ tablet Take 20 mEq by mouth daily.     rosuvastatin (CRESTOR) 40 MG tablet Take 1 tablet (40 mg total) by mouth at bedtime.     valACYclovir (VALTREX) 1000 MG tablet Take 1,000 mg by mouth 2 (two) times daily as needed (for fever blisters.).      No current facility-administered medications for this visit.    Allergies:   Patient has no known allergies.   Social History:  The patient  reports that he quit smoking about 49 years  ago. His smoking use included cigarettes. He has a 16.00 pack-year smoking history. He has never used smokeless tobacco. He reports that he does not currently use alcohol after a past usage of about 7.0 standard drinks of alcohol per week. He reports that he does not use drugs.   Family History:  The patient's family history includes Cancer in his brother and sister; Cancer (age of onset: 35) in his mother; Deep vein thrombosis in his mother; Heart attack in his father and mother; Heart disease in his mother; Hyperlipidemia in his mother; Hypertension in his mother; Other in his mother and sister; Stroke in his mother; Varicose Veins in his mother.   ROS:  Please see the history of present illness.   ROS:  Please see the history of present illness.   Otherwise, review of systems is positive for none.   All other systems are reviewed and negative.   PHYSICAL EXAM: VS:  BP (!) 142/84   Pulse 75   Ht 5' 8.5" (1.74 m)   Wt 170 lb (77.1 kg)   SpO2 98%   BMI 25.47 kg/m  , BMI Body mass index is 25.47 kg/m. GEN: Well nourished, well developed, in no acute distress  HEENT: normal  Neck: no JVD, carotid bruits, or masses Cardiac: RRR; no murmurs, rubs, or gallops,no edema  Respiratory:  clear to auscultation bilaterally, normal work of breathing GI: soft, nontender, nondistended, + BS MS: no deformity or atrophy  Skin: warm and dry, device site well healed Neuro:  Strength and sensation are intact Psych: euthymic mood, full affect  EKG:  EKG is ordered today. Personal review of the ekg ordered shows AV paced  Personal review of the device interrogation today. Results in [place]: 02/11/2022: ALT 20; Hemoglobin 14.8; Platelets 136 03/25/2022: BUN 14; Creatinine, Ser 1.04; NT-Pro BNP 638; Potassium 4.5; Sodium 141    Lipid Panel     Component Value Date/Time   CHOL 159 11/21/2018 0836   TRIG 82 11/21/2018 0836   HDL 59 11/21/2018 0836   CHOLHDL 2.7 11/21/2018 0836   LDLCALC  84 11/21/2018 0836     Wt Readings from Last 3 Encounters:  06/20/22 170 lb (77.1 kg)  04/22/22 173 lb (78.5 kg)  03/16/22 171 lb 9.6 oz (77.8 kg)      Other studies Reviewed: Additional studies/ records that  were reviewed today include: TTE 03/19/21  Review of the above records today demonstrates:   1. Left ventricular ejection fraction, by estimation, is 60 to 65%. The  left ventricle has normal function. The left ventricle has no regional  wall motion abnormalities. There is mild left ventricular hypertrophy.  Left ventricular diastolic parameters  are indeterminate.   2. Right ventricular systolic function is normal. The right ventricular  size is normal.   3. The mitral valve is normal in structure. Mild mitral valve  regurgitation. No evidence of mitral stenosis.   4. The aortic valve is calcified. Aortic valve regurgitation is trivial.  Mild to moderate aortic valve sclerosis/calcification is present, without  any evidence of aortic stenosis.   5. The inferior vena cava is normal in size with greater than 50%  respiratory variability, suggesting right atrial pressure of 3 mmHg.    ASSESSMENT AND PLAN:  1.  Second-degree AV block: Has had episodes of 2-1 AV block intermittently.  Now status post Medtronic dual-chamber pacemaker implanted 07/28/2021.  Had lead revision on 08/16/2021 due to failure to capture with the 3830-lead.  Device function appropriately.  No changes at this time.  2.  Chronic diastolic heart failure: No obvious volume overload.  Continue plan per primary cardiology  3.  Typical atrial flutter: Currently on Eliquis 5 mg twice daily.  CHA2DS2-VASc of at least 5.  Has been loaded on amiodarone.  High risk medication monitoring for amiodarone.  He is remained in sinus rhythm.  We Rylen Hou continue current management.  Current medicines are reviewed at length with the patient today.   The patient does not have concerns regarding his medicines.  The following  changes were made today: None  Labs/ tests ordered today include:  Orders Placed This Encounter  Procedures   CUP [place]   EKG 12-Lead     Disposition:   FU with Anuel Sitter 12 months  Signed, Jaylise Peek Meredith Leeds, MD  06/20/2022 9:26 AM     [place] [address] [phone] (office) [phone] (fax)

## 2022-06-27 ENCOUNTER — Telehealth: Payer: Self-pay | Admitting: Cardiology

## 2022-06-27 NOTE — Telephone Encounter (Signed)
Called patient and he reported the following blood pressure results:  151/88 61 142/83 63 189/77 73 197/77 81 174/99 88  His blood pressure this morning was 156/96.  Blood pressure is high, states he called doctor on call over the weekend.  He took 50 mg losartan of old blood pressure medication he was on as advised by the doctor on call. He would like to get back on blood pressure medication.  States his PCP was the one that took him off the blood pressure medication, he hasn't been on any blood pressure medication for 14 months.  States he is under a lot of stress lately and that may be causing his blood pressure issue.

## 2022-06-27 NOTE — Telephone Encounter (Signed)
Pt c/o BP issue: STAT if pt c/o blurred vision, one-sided weakness or slurred speech  1. What are your last 5 BP readings?   151/88 61 142/83 63 189/77 73 197/77 81 174/99 88  2. Are you having any other symptoms (ex. Dizziness, headache, blurred vision, passed out)? States he is dizzy  3. What is your BP issue? Blood pressure is high, states he called doctor on call over the weekend.  He took 50mg  losartan of old blood pressure medication he was on as advised by the doctor on call. He would like to get back on blood pressure medication.  States his PCP was the one that took him off the blood pressure medication, he hasn't been on any blood pressure medication for 14 months.  States he is under a lot of stress lately and that may be causing his blood pressure issue.

## 2022-06-28 ENCOUNTER — Other Ambulatory Visit: Payer: Self-pay

## 2022-06-28 DIAGNOSIS — I10 Essential (primary) hypertension: Secondary | ICD-10-CM

## 2022-06-28 MED ORDER — LOSARTAN POTASSIUM 50 MG PO TABS: 50.0000 mg | ORAL_TABLET | Freq: Every day | ORAL | 3 refills | Status: DC

## 2022-06-28 NOTE — Telephone Encounter (Signed)
Called the patient and informed him of [name] recommendation below:  "Lets go back on losartan 50 mg daily, Chem-7 need to be checked within a week"   Patient was agreeable with this plan and had no further questions at this time.

## 2022-07-03 NOTE — Progress Notes (Signed)
[facility]  [address] 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  We began seeing him in March 2019 for follow-up.  He was found to have an enlarging right lower lobe nodule in January 2019 and had a wedge resection by VATS procedure by Dr. Modesto Charon in February.  Pathology revealed an invasive moderately differentiated adenocarcinoma measuring 1 cm with 14 - nodes.  His CEA was elevated at 9 postoperatively.He is on observation only.  He is also being followed by the urologist for multiple renal cysts.  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  Colonoscopy in May 2018 revealed colon polyps with pathology revealing tubular adenomas.  Upper endoscopy and colonoscopy in 2019 did not reveal any malignancy, so we feel we have ruled out a gastrointestinal source as the cause of the increased CEA.  The CEA increased to 10.5 in November, but then was back down to 8.9 in February.  Repeat CT imaging of the chest, abdomen and pelvis did not reveal any evidence of malignancy.  Previously seen right pleural effusion had decreased, there was a similar appearance of the suspected rounded atelectasis at the right lung base, which was also decreased.  CEA at that time was 9.9.  There was stable bilateral renal cysts.  He had surgery of the left lateral and posterior neck for additional squamous cell carcinomas of the skin.  Repeat CT chest, abdomen and pelvis in October revealed stable rounded presumed atelectasis in the right lower lobe along the resection site with stable scarring, stable volume loss in the right lower lobe and right middle lobe,  as well as stable small adjacent right pleural effusion with adjacent pleural thickening without changes suggestive of active malignancy.  The bilateral renal cysts were stable.  CT imaging from May 2021 revealed stable appearance of right pleural thickening, trace pleural effusion and adjacent density along the wedge resection site favoring rounded atelectasis and scarring in the right lower lobe.  No compelling findings of active malignancy.  CEA was 10.5, previously 9.7, but this tends to fluctuate up and down in this range.  He has some residual effects from COVID-19 that he had in November 2020. CEA from May 2022 was up to 11.0 from 10.3 the year before. CT scans of abdomen and pelvis were negative.  CT chest in September 2021 showed scarring only.  INTERVAL HISTORY:  Louis Barton is here for routine follow up and states that he is grieving as he lost his wife in April of this year.  His daughter is here with him today and has significant concerns about how he is doing.  One of her concerns is his poor balance.  He does describe a hard fall hitting his left head and a similar second fall later.  He also has been extremely sensitive to any anesthesia.  She is also concerned that he has had difficulty finding words and is occasionally dizzy.  This has caused problems with his sleep and appetite.  He does admit to drinking 3 ounces of scotch at night.  He did have COVID-19 infection 1 year ago and may have  even had a stroke at that time.  He had a repeat CT scan of the chest in May 2023 to follow-up on his lung cancer and this was negative.  He has had persistent elevation of his CEA, and I have no explanation.  The CT scan shows changes of right lower lobe lobectomy and unchanged small loculated right pleural effusion with pleural thickening but no significant change from prior scans.  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  He complains of left leg edema, which is chronic, and also constipation.  He is using stool  softeners for that.  He does have a history of tubular adenomas in 2018 and 2019.  His last colonoscopy was with Dr. Lyndel Safe in June of 2019.  I reviewed his records and he did have an MRI of the brain in August 2022.  A CT of the head was also done last summer.  CBC today reveals a mild thrombocytopenia of 129,000, and CMP is normal other than a mild hyponatremia with a sodium of 134.  His latest CEA remains elevated at 11.2, up from 10.2 six months ago.  His appetite is good, and his weight is stable.  He denies fever, chills or other signs of infection.  He denies nausea, vomiting, bowel issues, or abdominal pain.  He denies sore throat, dyspnea, or chest pain.  He does complain of some frequent cough productive of clear sputum.  We will add a testosterone level and BNP at his daughter's request.  He is now back on losartan 50 mg once daily.  REVIEW OF SYSTEMS:  Review of Systems  Constitutional:  Positive for fatigue (mild). Negative for appetite change, chills, fever and unexpected weight change.  HENT:  Negative.    Eyes: Negative.   Respiratory:  Negative for chest tightness, hemoptysis, shortness of breath and wheezing. Cough: productive with milky white to clear sputum and sinus drainage.  Cardiovascular: Negative.  Negative for chest pain, leg swelling and palpitations.  Gastrointestinal: Negative.  Negative for abdominal distention, abdominal pain, blood in stool, constipation, diarrhea, nausea and vomiting.  Endocrine: Negative.   Genitourinary: Negative.  Negative for difficulty urinating, dysuria, frequency and hematuria.   Musculoskeletal: Negative.  Negative for arthralgias, back pain, flank pain, gait problem and myalgias.  Skin: Negative.   Neurological: Negative.  Negative for dizziness, extremity weakness, gait problem, headaches, light-headedness, numbness, seizures and speech difficulty.  Hematological: Negative.   Psychiatric/Behavioral:  Negative for depression and sleep  disturbance. The patient is nervous/anxious (occasional).   All other systems reviewed and are negative.    VITALS:  Blood pressure (!) 160/89, pulse 81, temperature 97.7 F (36.5 C), temperature source Oral, resp. rate 17, height 5' 8.5" (1.74 m), weight 170 lb 8 oz (77.3 kg), SpO2 98 %.  Wt Readings from Last 3 Encounters:  07/04/22 170 lb 8 oz (77.3 kg)  06/20/22 170 lb (77.1 kg)  04/22/22 173 lb (78.5 kg)    Body mass index is 25.55 kg/m.  Performance status (ECOG): 1 - Symptomatic but completely ambulatory  PHYSICAL EXAM:  Physical Exam Constitutional:      General: He is not in acute distress.    Appearance: Normal appearance. He is normal weight.  HENT:     Head: Normocephalic and atraumatic.  Eyes:     General: No scleral icterus.    Extraocular Movements: Extraocular movements intact.     Conjunctiva/sclera: Conjunctivae normal.     Pupils: Pupils are equal, round, and reactive to light.  Cardiovascular:  Rate and Rhythm: Normal rate and regular rhythm.     Pulses: Normal pulses.     Heart sounds: Normal heart sounds. No murmur heard.    No friction rub. No gallop.  Pulmonary:     Effort: Pulmonary effort is normal. No respiratory distress.     Breath sounds: Decreased breath sounds (of the right base) present.  Abdominal:     General: Bowel sounds are normal. There is no distension.     Palpations: Abdomen is soft. There is no hepatomegaly, splenomegaly or mass.     Tenderness: There is no abdominal tenderness.  Musculoskeletal:        General: Normal range of motion.     Cervical back: Normal range of motion and neck supple.     Right lower leg: 1+ Edema present.     Left lower leg: 2+ Edema present.  Lymphadenopathy:     Cervical: No cervical adenopathy.  Skin:    General: Skin is warm and dry.  Neurological:     General: No focal deficit present.     Mental Status: He is alert and oriented to person, place, and time. Mental status is at baseline.      Gait: Gait abnormal.     Comments: He has a positive Romberg.  When he ambulates, he does have a wide based gait.  Psychiatric:        Mood and Affect: Mood normal.        Behavior: Behavior normal.        Thought Content: Thought content normal.        Judgment: Judgment normal.    LABS:      Latest Ref Rng & Units 07/04/2022   12:00 AM 02/11/2022   12:00 AM 12/15/2021   10:02 AM  CBC  WBC  6.0     6.5       Hemoglobin 13.5 - 17.5 15.6     14.8     16.7   Hematocrit 41 - 53 47     47     49.0   Platelets 150 - 400 K/uL 129     136          This result is from an external source.      Latest Ref Rng & Units 07/04/2022   12:00 AM 03/25/2022   10:21 AM 02/11/2022   12:00 AM  CMP  Glucose 70 - 99 mg/dL  85    BUN 4 - 21 17     14  14       Creatinine 0.6 - 1.3 1.0     1.04  1.0      Sodium 137 - 147 134     141  139      Potassium 3.5 - 5.1 mEq/L 3.9     4.5  4.1      Chloride 99 - 108 105     101  105      CO2 13 - 22 30     26  29       Calcium 8.7 - 10.7 8.6     9.0  8.6      Alkaline Phos 25 - 125 62      48      AST 14 - 40 33      30      ALT 10 - 40 U/L 22      20         This result is from  an external source.     Lab Results  Component Value Date   CEA1 10.4 (H) 07/04/2022   CEA 11.2 02/11/2022   /  CEA  Date Value Ref Range Status  07/04/2022 10.4 (H) 0.0 - 4.7 ng/mL Final    Comment:    (NOTE)                             Nonsmokers          <3.9                             Smokers             <5.6 Roche [employer] Electrochemiluminescence Immunoassay (ECLIA) Values obtained with different assay methods or kits cannot be used interchangeably.  Results cannot be interpreted as absolute evidence of the presence or absence of malignant disease. Performed At: [facility] [place], [place] [id number] Rush Farmer MD [id number]   02/11/2022 11.2  Final    STUDIES:  No results found.    HISTORY:   Allergies: No Known  Allergies  Current Medications: Current Outpatient Medications  Medication Sig Dispense Refill   acetaminophen (TYLENOL) 500 MG tablet Take 1,000 mg by mouth every 8 (eight) hours as needed for mild pain or moderate pain (for pain.).     amiodarone (PACERONE) 200 MG tablet Take 200 mg by mouth daily.     apixaban (ELIQUIS) 5 MG TABS tablet Take 1 tablet (5 mg total) by mouth 2 (two) times daily. 180 tablet 2   Cholecalciferol (VITAMIN D3) 50 MCG (2000 UT) TABS Take 4,000 Units by mouth every other day.     colesevelam (WELCHOL) 625 MG tablet Take 1,250 mg by mouth 2 (two) times daily with a meal.     Cyanocobalamin (VITAMIN B-12 IJ) Inject 1 each as directed every 30 (thirty) days.     furosemide (LASIX) 20 MG tablet TAKE 1 TABLET BY MOUTH DAILY. (Patient taking differently: Take 20 mg by mouth daily.) 90 tablet 2   losartan (COZAAR) 50 MG tablet Take 1 tablet (50 mg total) by mouth daily. 90 tablet 3   Polyethyl Glycol-Propyl Glycol (SYSTANE ULTRA OP) Place 1 drop into both eyes daily. Unknown strength     potassium chloride (KLOR-CON) 10 MEQ tablet Take 20 mEq by mouth daily.     rosuvastatin (CRESTOR) 40 MG tablet Take 1 tablet (40 mg total) by mouth at bedtime.     valACYclovir (VALTREX) 1000 MG tablet Take 1,000 mg by mouth 2 (two) times daily as needed (for fever blisters.).      No current facility-administered medications for this visit.     ASSESSMENT & PLAN:   Assessment:    1. 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  He remains without evidence of disease over 4 1/2 years.  2.  Elevation of the CEA of uncertain etiology, which tends to fluctuate up and down, but is persistent and was up to 11.2 now, but was 11.0 in May 2022.  3. Multiple renal cysts being monitored by Dr. Tamala Julian, his urologist with [facility].  4. Multiple skin cancers. He had a squamous cell carcinoma resected from the upper right anterior chest.   5.  History of COVID-19 from November 2020 and again in April 2022.  6.  Multiple colon polyps.  His last  colonoscopy was in October 2019 with Dr. Lyndel Safe, which revealed two polyps consistent with tubular adenomas.  I therefore recommend that he undergo at least 1 more colonoscopy as he is a fairly healthy individual, especially with the persistently elevated CEA.    7.  Minimal thrombocytopenia.  8.  Mild hyponatremia.  Plan: I discussed with his daughter whether we would pursue any further imaging of his brain, but this was evaluated by Dr. Bea Graff last year.  I am not sure whether he could have an MRI of the brain now that he has a pacemaker.  I did mention that he is a daily drinker of alcohol, although small amounts.  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  I do not feel that his neurologic symptoms are related to malignancy.  I will see him back in 6 months with CBC, CMP, CEA, and CT of the chest for repeat evaluation.  He will then be 5 years postop and we could probably go to yearly scans.  However we may want to continue to monitor his CEA.  At their request, I did add a testosterone level and BNP to his labs today and they are unremarkable.  I have recommended that she discuss some of these concerns with Dr. Bea Graff and he may want to consider a neurology referral.  However I doubt that there is a correctable problem here.  The patient understands the plans discussed today and is in agreement with them.  The patient knows to contact our office if his develops concerns prior to his next appointment.  I provided 30 minutes of face-to-face time during this this encounter and > 50% was spent counseling as documented under my assessment and plan.  ADDENDUM: His repeat CEA today is down somewhat at 10.4.

## 2022-07-04 ENCOUNTER — Other Ambulatory Visit: Payer: Self-pay | Admitting: Oncology

## 2022-07-04 ENCOUNTER — Inpatient Hospital Stay: Payer: Medicare Other | Attending: Oncology | Admitting: Oncology

## 2022-07-04 ENCOUNTER — Encounter: Payer: Self-pay | Admitting: Oncology

## 2022-07-04 ENCOUNTER — Telehealth: Payer: Self-pay | Admitting: Oncology

## 2022-07-04 ENCOUNTER — Inpatient Hospital Stay: Payer: Medicare Other

## 2022-07-04 VITALS — BP 160/89 | HR 81 | Temp 97.7°F | Resp 17 | Ht 68.5 in | Wt 170.5 lb

## 2022-07-04 DIAGNOSIS — E871 Hypo-osmolality and hyponatremia: Secondary | ICD-10-CM | POA: Insufficient documentation

## 2022-07-04 DIAGNOSIS — Z79899 Other long term (current) drug therapy: Secondary | ICD-10-CM | POA: Insufficient documentation

## 2022-07-04 DIAGNOSIS — Z950 Presence of cardiac pacemaker: Secondary | ICD-10-CM | POA: Diagnosis not present

## 2022-07-04 DIAGNOSIS — R970 Elevated carcinoembryonic antigen [CEA]: Secondary | ICD-10-CM | POA: Diagnosis not present

## 2022-07-04 DIAGNOSIS — R600 Localized edema: Secondary | ICD-10-CM | POA: Diagnosis not present

## 2022-07-04 DIAGNOSIS — R5383 Other fatigue: Secondary | ICD-10-CM

## 2022-07-04 DIAGNOSIS — K635 Polyp of colon: Secondary | ICD-10-CM | POA: Diagnosis not present

## 2022-07-04 DIAGNOSIS — C3490 Malignant neoplasm of unspecified part of unspecified bronchus or lung: Secondary | ICD-10-CM

## 2022-07-04 DIAGNOSIS — Z85118 Personal history of other malignant neoplasm of bronchus and lung: Secondary | ICD-10-CM | POA: Insufficient documentation

## 2022-07-04 DIAGNOSIS — Z8673 Personal history of transient ischemic attack (TIA), and cerebral infarction without residual deficits: Secondary | ICD-10-CM | POA: Diagnosis not present

## 2022-07-04 DIAGNOSIS — Z8616 Personal history of COVID-19: Secondary | ICD-10-CM | POA: Insufficient documentation

## 2022-07-04 DIAGNOSIS — D696 Thrombocytopenia, unspecified: Secondary | ICD-10-CM | POA: Diagnosis not present

## 2022-07-04 DIAGNOSIS — Z85828 Personal history of other malignant neoplasm of skin: Secondary | ICD-10-CM | POA: Insufficient documentation

## 2022-07-04 DIAGNOSIS — R601 Generalized edema: Secondary | ICD-10-CM

## 2022-07-04 LAB — COMPREHENSIVE METABOLIC PANEL
Albumin: 4 (ref 3.5–5.0)
Calcium: 8.6 — AB (ref 8.7–10.7)

## 2022-07-04 LAB — CBC: RBC: 4.96 (ref 3.87–5.11)

## 2022-07-04 LAB — BRAIN NATRIURETIC PEPTIDE: B Natriuretic Peptide: 274.5 pg/mL — ABNORMAL HIGH (ref 0.0–100.0)

## 2022-07-04 LAB — CBC AND DIFFERENTIAL
HCT: 47 (ref 41–53)
Hemoglobin: 15.6 (ref 13.5–17.5)
Neutrophils Absolute: 3.84
Platelets: 129 10*3/uL — AB (ref 150–400)
WBC: 6

## 2022-07-04 LAB — BASIC METABOLIC PANEL
BUN: 17 (ref 4–21)
CO2: 30 — AB (ref 13–22)
Chloride: 105 (ref 99–108)
Creatinine: 1 (ref 0.6–1.3)
Glucose: 94
Potassium: 3.9 mEq/L (ref 3.5–5.1)
Sodium: 134 — AB (ref 137–147)

## 2022-07-04 LAB — HEPATIC FUNCTION PANEL
ALT: 22 U/L (ref 10–40)
AST: 33 (ref 14–40)
Alkaline Phosphatase: 62 (ref 25–125)
Bilirubin, Total: 0.9

## 2022-07-04 NOTE — Telephone Encounter (Signed)
07/04/22 Next appt scheduled and confirmed with patient

## 2022-07-05 ENCOUNTER — Telehealth: Payer: Self-pay | Admitting: Cardiology

## 2022-07-05 LAB — TESTOSTERONE: Testosterone: 326 ng/dL (ref 264–916)

## 2022-07-05 LAB — CEA: CEA: 10.4 ng/mL — ABNORMAL HIGH (ref 0.0–4.7)

## 2022-07-05 NOTE — Telephone Encounter (Signed)
Spoke with the patient and gave advisement from Dr. Bettina Gavia. Patient verbalized understanding.

## 2022-07-05 NOTE — Telephone Encounter (Signed)
Patient needs to speak with someone about his recent BNP lab results.  Please advise.

## 2022-07-05 NOTE — Telephone Encounter (Signed)
Spoke with the patient who is concerned about this elevated BNP. He had labs done at his oncologist's office yesterday. Patient states that he dropped the lab work off at the [place] office today and was told that [name] was out of the office all week. He would like for Dr. Bettina Gavia or Dr. Geraldo Pitter to review his lab results and make any recommendations.

## 2022-07-20 ENCOUNTER — Telehealth: Payer: Self-pay | Admitting: *Deleted

## 2022-07-20 ENCOUNTER — Encounter: Payer: Self-pay | Admitting: Cardiology

## 2022-07-20 NOTE — Telephone Encounter (Signed)
   Patient Name: Louis Barton  DOB: 12-19-1940 MRN: [id number]  Primary Cardiologist: Jenne Campus, MD  Chart reviewed as part of pre-operative protocol coverage. Cataract extractions are recognized in guidelines as low risk surgeries that do not typically require specific preoperative testing or holding of blood thinner therapy. Therefore, given past medical history and time since last visit, based on ACC/AHA guidelines, Louis Barton would be at acceptable risk for the planned procedure without further cardiovascular testing.   Will await recommendations from device team regarding PPM.    Loel Dubonnet, NP 07/20/2022, 1:40 PM

## 2022-07-20 NOTE — Telephone Encounter (Signed)
Device clearance faxed.

## 2022-07-20 NOTE — Progress Notes (Signed)
[facility] DEVICE PROGRAMMING  Patient Information: Name:  Louis Barton  DOB:  05-17-1941  MRN:  [id number]   Procedure:   CATARACT EXTRACTION BY PE, IOL-LEFT THEN RIGHT EYE   Date of Surgery:  Clearance TBD                                 Surgeon:  DR. Julian Reil Surgeon's Group or Practice Name:  [facility] Phone number:  [phone] Fax number:  [phone]   Type of Clearance Requested:   - Medical ; NO MEDICATIONS ARE LISTED AS NEEDING TO BE HELD; DR. Alanda Slim IS REQUESTING DEVICE CLEARANCE ON PACEMAKER AS WELL. I WILL SEND A MESSAGE TO THE DEVICE NURSE   Type of Anesthesia:  Local   Device Information:  Clinic EP Physician:  Allegra Lai, MD   Device Type:  Pacemaker Manufacturer and Phone #:  [employer]: [id number] Pacemaker Dependent?:  Yes.   Date of Last Device Check:  06/20/2022 Normal Device Function?:  Yes.    Electrophysiologist's Recommendations:  Have magnet available. Provide continuous ECG monitoring when magnet is used or reprogramming is to be performed.  Procedure should not interfere with device function.  No device programming or magnet placement needed.  Per Device Clinic Standing Orders, Louis Leavell, RN  2:11 PM 07/20/2022

## 2022-07-20 NOTE — Telephone Encounter (Signed)
Device clearance faxed separately. WIll route recommendations via Epic fax function and remove from preop pool.   Loel Dubonnet, NP

## 2022-07-20 NOTE — Telephone Encounter (Signed)
   Pre-operative Risk Assessment    Patient Name: Louis Barton  DOB: 1940/10/03 MRN: [id number]      Request for Surgical Clearance    Procedure:   CATARACT EXTRACTION BY PE, IOL-LEFT THEN RIGHT EYE  Date of Surgery:  Clearance TBD                                 Surgeon:  DR. Julian Reil Surgeon's Group or Practice Name:  [facility] Phone number:  [phone] Fax number:  [phone]   Type of Clearance Requested:   - Medical ; NO MEDICATIONS ARE LISTED AS NEEDING TO BE HELD; DR. Alanda Slim IS REQUESTING DEVICE CLEARANCE ON PACEMAKER AS WELL. I WILL SEND A MESSAGE TO THE DEVICE NURSE   Type of Anesthesia:  Local    Additional requests/questions:    Jiles Prows   07/20/2022, 12:48 PM

## 2022-07-26 LAB — CUP PACEART REMOTE DEVICE CHECK
Battery Remaining Longevity: 129 mo
Battery Voltage: 3.03 V
Brady Statistic AP VP Percent: 95.46 %
Brady Statistic AP VS Percent: 0.7 %
Brady Statistic AS VP Percent: 3.64 %
Brady Statistic AS VS Percent: 0.2 %
Brady Statistic RA Percent Paced: 96.1 %
Brady Statistic RV Percent Paced: 99.1 %
Date Time Interrogation Session: 20231031065223
Implantable Lead Connection Status: 753985
Implantable Lead Connection Status: 753985
Implantable Lead Implant Date: 20221102
Implantable Lead Implant Date: 20221121
Implantable Lead Location: 753859
Implantable Lead Location: 753860
Implantable Lead Model: 3830
Implantable Lead Model: 5076
Implantable Pulse Generator Implant Date: 20221102
Lead Channel Impedance Value: 266 Ohm
Lead Channel Impedance Value: 342 Ohm
Lead Channel Impedance Value: 437 Ohm
Lead Channel Impedance Value: 475 Ohm
Lead Channel Pacing Threshold Amplitude: 0.875 V
Lead Channel Pacing Threshold Amplitude: 1 V
Lead Channel Pacing Threshold Pulse Width: 0.4 ms
Lead Channel Pacing Threshold Pulse Width: 0.4 ms
Lead Channel Sensing Intrinsic Amplitude: 1.375 mV
Lead Channel Sensing Intrinsic Amplitude: 1.375 mV
Lead Channel Sensing Intrinsic Amplitude: 10.625 mV
Lead Channel Sensing Intrinsic Amplitude: 10.625 mV
Lead Channel Setting Pacing Amplitude: 2 V
Lead Channel Setting Pacing Amplitude: 2 V
Lead Channel Setting Pacing Pulse Width: 0.4 ms
Lead Channel Setting Sensing Sensitivity: 0.9 mV
Zone Setting Status: 755011
Zone Setting Status: 755011

## 2022-07-28 ENCOUNTER — Ambulatory Visit (INDEPENDENT_AMBULATORY_CARE_PROVIDER_SITE_OTHER): Payer: Medicare Other

## 2022-07-28 DIAGNOSIS — I441 Atrioventricular block, second degree: Secondary | ICD-10-CM

## 2022-08-01 ENCOUNTER — Other Ambulatory Visit: Payer: Self-pay | Admitting: Cardiology

## 2022-08-04 ENCOUNTER — Telehealth: Payer: Self-pay

## 2022-08-04 ENCOUNTER — Other Ambulatory Visit: Payer: Self-pay

## 2022-08-04 DIAGNOSIS — I10 Essential (primary) hypertension: Secondary | ICD-10-CM

## 2022-08-04 MED ORDER — POTASSIUM CHLORIDE CRYS ER 20 MEQ PO TBCR: 20.0000 meq | EXTENDED_RELEASE_TABLET | Freq: Every day | ORAL | 2 refills | Status: DC

## 2022-08-04 NOTE — Telephone Encounter (Signed)
Per [name] verbally after reviewing CMET from [facility]  07/26/2022 dos, ok to fill potassium chl 18meq daily. Patient notified medication sent

## 2022-08-08 NOTE — Progress Notes (Signed)
Remote pacemaker transmission.   

## 2022-08-17 ENCOUNTER — Ambulatory Visit: Payer: Medicare Other | Admitting: Oncology

## 2022-08-17 ENCOUNTER — Other Ambulatory Visit: Payer: Medicare Other

## 2022-08-23 ENCOUNTER — Ambulatory Visit: Payer: Medicare Other | Attending: Cardiology | Admitting: Cardiology

## 2022-08-23 ENCOUNTER — Encounter: Payer: Self-pay | Admitting: Cardiology

## 2022-08-23 VITALS — BP 132/70 | HR 65 | Ht 68.0 in | Wt 170.6 lb

## 2022-08-23 DIAGNOSIS — I2510 Atherosclerotic heart disease of native coronary artery without angina pectoris: Secondary | ICD-10-CM

## 2022-08-23 DIAGNOSIS — I872 Venous insufficiency (chronic) (peripheral): Secondary | ICD-10-CM

## 2022-08-23 DIAGNOSIS — I5032 Chronic diastolic (congestive) heart failure: Secondary | ICD-10-CM

## 2022-08-23 DIAGNOSIS — I483 Typical atrial flutter: Secondary | ICD-10-CM | POA: Diagnosis not present

## 2022-08-23 DIAGNOSIS — I739 Peripheral vascular disease, unspecified: Secondary | ICD-10-CM | POA: Diagnosis not present

## 2022-08-23 DIAGNOSIS — I10 Essential (primary) hypertension: Secondary | ICD-10-CM | POA: Diagnosis present

## 2022-08-23 NOTE — Patient Instructions (Signed)
Medication Instructions:  Your physician recommends that you continue on your current medications as directed. Please refer to the Current Medication list given to you today.  *If you need a refill on your cardiac medications before your next appointment, please call your pharmacy*   Lab Work: None Ordered If you have labs (blood work) drawn today and your tests are completely normal, you will receive your results only by: [facility] (if you have MyChart) OR A paper copy in the mail If you have any lab test that is abnormal or we need to change your treatment, we will call you to review the results.   Testing/Procedures: None Ordered   Follow-Up: At [facility], you and your health needs are our priority.  As part of our continuing mission to provide you with exceptional heart care, we have created designated Provider Care Teams.  These Care Teams include your primary Cardiologist (physician) and Advanced Practice Providers (APPs -  Physician Assistants and Nurse Practitioners) who all work together to provide you with the care you need, when you need it.  We recommend signing up for the patient portal called "MyChart".  Sign up information is provided on this After Visit Summary.  MyChart is used to connect with patients for Virtual Visits (Telemedicine).  Patients are able to view lab/test results, encounter notes, upcoming appointments, etc.  Non-urgent messages can be sent to your provider as well.   To learn more about what you can do with MyChart, go to [email or web address].    Your next appointment:   4 month(s)  The format for your next appointment:   In Person  Provider:   Jenne Campus, MD    Other Instructions NA

## 2022-08-23 NOTE — Progress Notes (Unsigned)
Cardiology Office Note:    Date:  08/23/2022   ID:  Louis Barton, DOB Feb 10, 1941, MRN [id number]  PCP:  Raina Mina., MD  Cardiologist:  Jenne Campus, MD    Referring MD: Raina Mina., MD   Chief Complaint  Patient presents with   Follow-up    History of Present Illness:    Louis Barton is a 81 y.o. male  with quite complex past medical history.  That include coronary artery disease, mild nonobstructive, cardiac catheterization done in May 2022 showed 20% mid and distal circumflex as well as 20% of mid LAD lesion.  Also history of essential hypertension first-degree AV block, status post bilateral carotic endarterectomy, non-small cell lung cancer status post wedge resection and right lower lobe, remote history of TIA.  Recently he ended up being in [facility] because of episode of atrial flutter with slow ventricular rate.  He did have TEE done and then eventually electrical cardioversion.  Few months ago he came to hospital with confusion and suspicion for CVA/TIA has been raised however future evaluation showed found that he was positive for COVID-19.  Luckily recovered quite nicely.  While he was in the hospital he was bradycardic with 2-1 AV conduction.  He was eventually referred to EP team for consideration of pacemaker implantation.  Eventually end of November of last year he did have pacemaker implantation.  There was a problem with the lead to the activity will need to be revised after that he was noted to have typical atrial flutter and in March of this year he did have cardioversion and seems to maintain sinus rhythm since then.  He comes today to follow-up.  Tragically strike his wife who had severe arctic stenosis had TAVI done however shortly after that she got vascular collapse and she had perforated left ventricle surgically intervene however eventually she ended up passing.   He comes today to my office for follow-up, overall seems to be doing fair.  He did have some  bronchitis-like symptoms with a lot of cough and sputum production but that is getting better.  Denies have any chest pain tightness squeezing pressure burning chest he said he does not feel his heart beating.  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 signficant DDD present with vacuum effect L4-5 and L5-S1 flat discs and anterior spurring noted   Dizziness 05/06/2020   Dyslipidemia 03/18/2015   Dyspnea on exertion 04/02/2020   Ear itch 05/14/2018   Edema 11/16/2015   Edema of both lower  extremities due to peripheral venous insufficiency 02/03/2020   First degree AV block 10/20/2020   Heart failure ([place])    History of hiatal hernia    seen on last scan- as slight    History of thrombocytopenia    History of TIAs    Hyperlipidemia    Hypertension    Irritable bowel syndrome 11/17/2016   rec trial of citrucel/ diet 11/16/2016 >>>      Local edema 02/07/2018   Malaise and fatigue 12/08/2020   Malignant neoplasm of left lung ([place]) 12/08/2017   Stage 1A. [place]. And Dr. 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 These slowly enlarging 9 mm right lower lobe pulmonary nodule along the right hemidiaphragm does not appear hypermetabolic today. Location adjacent to the hemidiaphragm can cause false negatives due to motion artifact related to the   Sprain of left ankle 05/21/2021   Stage 3a chronic kidney disease ([place]) 06/17/2020   Stroke ([place])    Swelling of limb-Left foot 01/01/2015   Vitamin B12 deficiency 06/21/2017    Past Surgical History:  Procedure Laterality Date   adenocarcinoma  2019   Removed   CARDIOVERSION N/A 03/22/2021   Procedure: CARDIOVERSION;  Surgeon: Lelon Perla, MD;  Location: [place];  Service: Cardiovascular;  Laterality: N/A;   CARDIOVERSION N/A 12/15/2021   Procedure: CARDIOVERSION;  Surgeon: Janina Mayo, MD;  Location: [place];  Service: Cardiovascular;  Laterality: N/A;   CAROTID ENDARTERECTOMY  2005   Left carotid by Dr. Oneida Alar   CAROTID ENDARTERECTOMY  04/25/11   Right Carotid by Dr. Oneida Alar   COLONOSCOPY  02/23/2017   Colonic polyp status post polypectomy. Pancolonic diverticulosis predominatly in the sigmoid colon. Tubular adenoma    LEAD REVISION/REPAIR N/A 08/16/2021   Procedure: LEAD REVISION/REPAIR;  Surgeon: Constance Haw, MD;  Location: [place] [facility];  Service: Cardiovascular;  Laterality: N/A;   LEFT HEART CATH AND CORONARY ANGIOGRAPHY N/A 02/08/2021   Procedure: LEFT HEART CATH AND CORONARY ANGIOGRAPHY;  Surgeon: Burnell Blanks, MD;  Location: [place] [facility];  Service: Cardiovascular;  Laterality: N/A;   PACEMAKER IMPLANT N/A 07/28/2021   Procedure: PACEMAKER IMPLANT;  Surgeon: Constance Haw, MD;  Location: [place] [facility];  Service: Cardiovascular;  Laterality: N/A;   Septoplasty, nasal w/ submucosal resection  1960   SQUAMOUS CELL CARCINOMA EXCISION     pt said numerous times   TEE WITHOUT CARDIOVERSION N/A 03/22/2021   Procedure: TRANSESOPHAGEAL ECHOCARDIOGRAM (TEE);  Surgeon: Lelon Perla, MD;  Location: [facility];  Service: Cardiovascular;  Laterality: N/A;   TONSILLECTOMY  VIDEO ASSISTED THORACOSCOPY (VATS)/WEDGE RESECTION Right 11/09/2017   Procedure: RIGHT VIDEO ASSISTED THORACOSCOPY WITH Chales Salmon RESECTION;  Surgeon: [name], MD;  Location: [place];  Service: Thoracic;  Laterality: Right;    Current Medications: Current Meds  Medication Sig   acetaminophen (TYLENOL) 500 MG tablet Take 1,000 mg by mouth every 8 (eight) hours as needed for mild pain or moderate pain (for pain.).   amiodarone (PACERONE) 200 MG tablet Take 200 mg by mouth daily.   apixaban (ELIQUIS) 5 MG TABS tablet Take 1 tablet (5 mg total) by mouth 2 (two) times daily.   Cholecalciferol (VITAMIN D3) 50 MCG (2000 UT) TABS Take 4,000 Units by mouth every other day.   colesevelam (WELCHOL) 625 MG tablet Take 1,250 mg by mouth 2 (two) times daily with a meal.   Cyanocobalamin (VITAMIN B-12 IJ) Inject 1 each as directed every 30 (thirty) days.   furosemide (LASIX) 20 MG tablet TAKE 1 TABLET BY MOUTH DAILY. (Patient taking differently: Take 20 mg by mouth daily.)   losartan (COZAAR) 50 MG  tablet Take 1 tablet (50 mg total) by mouth daily.   Polyethyl Glycol-Propyl Glycol (SYSTANE ULTRA OP) Place 1 drop into both eyes daily. Unknown strength   potassium chloride (KLOR-CON) 10 MEQ tablet Take 20 mEq by mouth daily.   potassium chloride SA (KLOR-CON M20) 20 MEQ tablet Take 1 tablet (20 mEq total) by mouth daily.   rosuvastatin (CRESTOR) 40 MG tablet Take 1 tablet (40 mg total) by mouth at bedtime.   valACYclovir (VALTREX) 1000 MG tablet Take 1,000 mg by mouth 2 (two) times daily as needed (for fever blisters.).      Allergies:   Patient has no known allergies.   Social History   Socioeconomic History   Marital status: Widowed    Spouse name: Not on file   Number of children: 2   Years of education: Not on file   Highest education level: Not on file  Occupational History   Not on file  Tobacco Use   Smoking status: Former    Packs/day: 1.00    Years: 16.00    Total pack years: 16.00    Types: Cigarettes    Quit date: 09/26/1972    Years since quitting: 49.9   Smokeless tobacco: Never   Tobacco comments:    Former smoker 08/11/21  Vaping Use   Vaping Use: Never used  Substance and Sexual Activity   Alcohol use: Not Currently    Alcohol/week: 7.0 standard drinks of alcohol    Types: 7 Shots of liquor per week    Comment: 1 shot daily 08/11/21   Drug use: No   Sexual activity: Not on file  Other Topics Concern   Not on file  Social History Narrative   Not on file   Social Determinants of Health   Financial Resource Strain: Not on file  Food Insecurity: Not on file  Transportation Needs: Not on file  Physical Activity: Not on file  Stress: Not on file  Social Connections: Not on file     Family History: The patient's family history includes Cancer in his brother and sister; Cancer (age of onset: 64) in his mother; Deep vein thrombosis in his mother; Heart attack in his father and mother; Heart disease in his mother; Hyperlipidemia in his mother;  Hypertension in his mother; Other in his mother and sister; Stroke in his mother; Varicose Veins in his mother. There is no history of Colon cancer, Esophageal cancer, Rectal  cancer, or Stomach cancer. ROS:   Please see the history of present illness.    All 14 point review of systems negative except as described per history of present illness  EKGs/Labs/Other Studies Reviewed:      Recent Labs: 03/25/2022: NT-Pro BNP 638 07/04/2022: ALT 22; B Natriuretic Peptide 274.5; BUN 17; Creatinine 1.0; Hemoglobin 15.6; Platelets 129; Potassium 3.9; Sodium 134  Recent Lipid Panel    Component Value Date/Time   CHOL 159 11/21/2018 0836   TRIG 82 11/21/2018 0836   HDL 59 11/21/2018 0836   CHOLHDL 2.7 11/21/2018 0836   LDLCALC 84 11/21/2018 0836    Physical Exam:    VS:  BP 132/70 (BP Location: Right Arm, Patient Position: Sitting)   Pulse 65   Ht 5\' 8"  (1.727 m)   Wt 170 lb 9.6 oz (77.4 kg)   SpO2 98%   BMI 25.94 kg/m     Wt Readings from Last 3 Encounters:  08/23/22 170 lb 9.6 oz (77.4 kg)  07/04/22 170 lb 8 oz (77.3 kg)  06/20/22 170 lb (77.1 kg)     GEN:  Well nourished, well developed in no acute distress HEENT: Normal NECK: No JVD; No carotid bruits LYMPHATICS: No lymphadenopathy CARDIAC: RRR, no murmurs, no rubs, no gallops RESPIRATORY:  Clear to auscultation without rales, wheezing or rhonchi  ABDOMEN: Soft, non-tender, non-distended MUSCULOSKELETAL:  No edema; No deformity  SKIN: Warm and dry LOWER EXTREMITIES: no swelling NEUROLOGIC:  Alert and oriented x 3 PSYCHIATRIC:  Normal affect   ASSESSMENT:    1. Coronary artery disease involving native coronary artery of native heart without angina pectoris   2. PAD (peripheral artery disease) (HCC)   3. Edema of both lower extremities due to peripheral venous insufficiency   4. Typical atrial flutter ([place])   5. Primary hypertension   6. Chronic heart failure with preserved ejection fraction (HCC)    PLAN:    In order  of problems listed above:  Coronary artery disease stable from that point review denies have any symptoms that would indicate reactivation of the problem Peripheral vascular disease, history of carotic endarterectomy last check was in August 11, 2021, we will schedule him to have carotic ultrasound Pacemaker present last interrogation at the end of October, normal function normal battery and lead parameters Paroxysmal atrial flutter maintained sinus rhythm, on Eliquis which I will continue Essential hypertension he does have moments that his blood pressure goes high I told him to monitor this and let me know if needed need to increase dose of losartan to 50 mg twice daily.   Medication Adjustments/Labs and Tests Ordered: Current medicines are reviewed at length with the patient today.  Concerns regarding medicines are outlined above.  No orders of the defined types were placed in this encounter.  Medication changes: No orders of the defined types were placed in this encounter.   Signed, Park Liter, MD, [facility] 08/23/2022 8:36 AM    [place]

## 2022-10-14 NOTE — Progress Notes (Signed)
[facility]  [address] 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  We began seeing him in March 2019 for follow-up.  He was found to have an enlarging right lower lobe nodule in January 2019 and had a wedge resection by VATS procedure by Dr. Charlett Lango in February.  Pathology revealed an invasive moderately differentiated adenocarcinoma measuring 1 cm with 14 - nodes.  His CEA was elevated at 9 postoperatively.He is on observation only.  He is also being followed by the urologist for multiple renal cysts.  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  Colonoscopy in May 2018 revealed colon polyps with pathology revealing tubular adenomas.  Upper endoscopy and colonoscopy in 2019 did not reveal any malignancy, so we feel we have ruled out a gastrointestinal source as the cause of the increased CEA.  The CEA increased to 10.5 in November, but then was back down to 8.9 in February.  Repeat CT imaging of the chest, abdomen and pelvis did not reveal any evidence of malignancy.  Previously seen right pleural effusion had decreased, there was a similar appearance of the suspected rounded atelectasis at the right lung base, which was also decreased.  CEA at that time was 9.9.  There was stable bilateral renal cysts.  He had surgery of the left lateral and posterior neck for additional squamous cell carcinomas of the skin.  Repeat CT chest, abdomen and pelvis in October revealed stable rounded presumed atelectasis in the right lower lobe along the resection site with stable scarring, stable volume loss in the right lower lobe and right middle lobe,  as well as stable small adjacent right pleural effusion with adjacent pleural thickening without changes suggestive of active malignancy.  The bilateral renal cysts were stable.  CT imaging from May 2021 revealed stable appearance of right pleural thickening, trace pleural effusion and adjacent density along the wedge resection site favoring rounded atelectasis and scarring in the right lower lobe.  No compelling findings of active malignancy.  CEA was 10.5, previously 9.7, but this tends to fluctuate up and down in this range.  He has some residual effects from COVID-19 that he had in November 2020. CEA from May 2022 was up to 11.0 from 10.3 the year before. CT scans of abdomen and pelvis were negative.  CT chest in September 2021 showed scarring only.  INTERVAL HISTORY:  Louis Barton is here for a early routine follow up, he comes in with complaints of left flank pain that radiates under his rib for over the past week. Patient states that it is intermittent and is painful when he stretches. He is urinating frequently with urgency but no pain, he is on diuretics and denies hematuria. This is common for him. He states when his urine sits in the toilet it builds up white particles after a while. I ordered a urinanalysis, culture, and a CT scan of chest, abdomin, and pelvis. His CEA in 08/10/2020 was at 10.3, then went up 5/202022 to 11, 08/18/2022 down to 10.2 and as of 07/04/2022 it rose to 10.4. Today's labs are pending of CBC, CMP, and CEA. If  we can schedule his scan this week I would like to see him by Friday 10/21/2022 or very soon the week after. Patient was admitted to the ER on 09/12/2022, he was seen for fall and had scans of his head and neck which were negative. He did have sutures for the laceration of the right eye. His labs for that day were normal except his BUN was at 24. He denies signs of infection such as sore throat, sinus drainage, cough, or urinary symptoms.  He denies fevers or recurrent chills.  He denies pain. He denies nausea, vomiting, chest pain, dyspnea or cough. His weight appetite is good and his has been stable.   REVIEW OF SYSTEMS:  Review of Systems  Constitutional: Negative.  Negative for appetite change, chills, diaphoresis, fatigue, fever and unexpected weight change.  HENT:  Negative.  Negative for hearing loss, lump/mass, mouth sores, nosebleeds, sore throat, tinnitus, trouble swallowing and voice change.   Eyes: Negative.  Negative for eye problems and icterus.  Respiratory: Negative.  Negative for chest tightness, cough, hemoptysis, shortness of breath and wheezing.   Cardiovascular: Negative.  Negative for chest pain, leg swelling and palpitations.  Gastrointestinal: Negative.  Negative for abdominal distention, abdominal pain, blood in stool, constipation, diarrhea, nausea, rectal pain and vomiting.  Endocrine: Negative.   Genitourinary:  Positive for frequency. Negative for bladder incontinence, difficulty urinating, dyspareunia, dysuria, hematuria, nocturia, pelvic pain and penile discharge.   Musculoskeletal:  Positive for flank pain. Negative for arthralgias, back pain, gait problem, myalgias, neck pain and neck stiffness.  Skin: Negative.  Negative for itching, rash and wound.  Neurological: Negative.  Negative for dizziness, extremity weakness, gait problem, headaches, light-headedness, numbness, seizures and speech difficulty.  Hematological: Negative.  Negative for adenopathy. Does not bruise/bleed easily.  Psychiatric/Behavioral:  Negative for confusion, decreased concentration, depression, sleep disturbance and suicidal ideas. The patient is nervous/anxious (occasionally).   All other systems reviewed and are negative.    VITALS:  Blood pressure 139/82, pulse 74, temperature 97.9 F (36.6 C), temperature source Oral, resp. rate 18, height 5\' 8"  (1.727 m), weight 171 lb 1.6 oz (77.6 kg), SpO2 100 %.  Wt Readings from Last 3 Encounters:  10/21/22 171 lb  14.4 oz (78 kg)  10/17/22 171 lb 1.6 oz (77.6 kg)  08/23/22 170 lb 9.6 oz (77.4 kg)    Body mass index is 26.02 kg/m.  Performance status (ECOG): 1 - Symptomatic but completely ambulatory  PHYSICAL EXAM:  Physical Exam Constitutional:      General: He is not in acute distress.    Appearance: Normal appearance. He is normal weight. He is not ill-appearing, toxic-appearing or diaphoretic.  HENT:     Head: Normocephalic and atraumatic.     Right Ear: Tympanic membrane, ear canal and external ear normal. There is no impacted cerumen.     Left Ear: Tympanic membrane, ear canal and external ear normal. There is no impacted cerumen.     Nose: Nose normal. No congestion or rhinorrhea.     Mouth/Throat:     Mouth: Mucous membranes are moist.     Pharynx: Oropharynx is clear. No oropharyngeal exudate or posterior oropharyngeal erythema.  Eyes:     General: No scleral icterus.       Right eye: No discharge.        Left eye: No discharge.     Extraocular Movements: Extraocular movements intact.     Conjunctiva/sclera: Conjunctivae normal.     Pupils: Pupils are equal, round,  and reactive to light.  Neck:     Vascular: No carotid bruit.  Cardiovascular:     Rate and Rhythm: Normal rate and regular rhythm.     Pulses: Normal pulses.     Heart sounds: No murmur heard.    No friction rub. No gallop.  Pulmonary:     Effort: Pulmonary effort is normal. No respiratory distress.     Breath sounds: No stridor. Examination of the right-lower field reveals decreased breath sounds. Decreased breath sounds (of the right base) present. No wheezing, rhonchi or rales.  Chest:     Chest wall: No tenderness.     Comments: Presence of pace maker in the left upper chest wall. Abdominal:     General: Bowel sounds are normal. There is no distension.     Palpations: Abdomen is soft. There is no hepatomegaly, splenomegaly or mass.     Tenderness: There is no abdominal tenderness. There is no right CVA  tenderness, left CVA tenderness, guarding or rebound.     Hernia: No hernia is present.  Musculoskeletal:        General: No swelling, tenderness, deformity or signs of injury. Normal range of motion.     Cervical back: Normal range of motion and neck supple. No rigidity or tenderness.     Right lower leg: No edema.     Left lower leg: Edema present.     Comments: Mild edema.  Lymphadenopathy:     Cervical: No cervical adenopathy.  Skin:    General: Skin is warm and dry.     Coloration: Skin is not jaundiced or pale.     Findings: No bruising, erythema, lesion or rash.  Neurological:     General: No focal deficit present.     Mental Status: He is alert and oriented to person, place, and time. Mental status is at baseline.     Cranial Nerves: No cranial nerve deficit.     Sensory: No sensory deficit.     Motor: No weakness.     Coordination: Coordination normal.     Gait: Gait abnormal.     Deep Tendon Reflexes: Reflexes normal.     Comments: He has a positive Romberg.  When he ambulates, he does have a wide based gait.  Psychiatric:        Mood and Affect: Mood normal.        Behavior: Behavior normal.        Thought Content: Thought content normal.        Judgment: Judgment normal.     LABS:      Latest Ref Rng & Units 10/17/2022    9:09 AM 07/04/2022   12:00 AM 02/11/2022   12:00 AM  CBC  WBC 4.0 - 10.5 K/uL 5.9  6.0     6.5      Hemoglobin 13.0 - 17.0 g/dL 12.2  40.0     18.0      Hematocrit 39.0 - 52.0 % 46.7  47     47      Platelets 150 - 400 K/uL 133  129     136         This result is from an external source.     Ref Range & Units   07/26/2022  WBC 4.4 - 11.0 x 10*3/uL 5.7  RBC 4.50 - 5.90 x 10*6/uL 5.13  Hemoglobin 14.0 - 17.5 G/DL 97.0  Hematocrit 44.9 - 50.4 % 49.2  MCV 80.0 - 96.0 FL 95.9  MCH 27.5 - 33.2 PG 31.0  MCHC 33.0 - 37.0 G/DL 32.4 Low   RDW 12.3 - 17.0 % 14.9  Platelets 150 - 450 X 10*3/uL 120 Low   MPV 6.8 - 10.2 FL 10.6 High        Latest Ref Rng & Units 10/17/2022    9:09 AM 07/04/2022   12:00 AM 03/25/2022   10:21 AM  CMP  Glucose 70 - 99 mg/dL 93   85   BUN 8 - 23 mg/dL 16  17     14    Creatinine 0.61 - 1.24 mg/dL 1.06  1.0     1.04   Sodium 135 - 145 mmol/L 140  134     141   Potassium 3.5 - 5.1 mmol/L 4.3  3.9     4.5   Chloride 98 - 111 mmol/L 104  105     101   CO2 22 - 32 mmol/L 26  30     26    Calcium 8.9 - 10.3 mg/dL 8.5  8.6     9.0   Total Protein 6.5 - 8.1 g/dL 6.4     Total Bilirubin 0.3 - 1.2 mg/dL 0.7     Alkaline Phos 38 - 126 U/L 42  62       AST 15 - 41 U/L 27  33       ALT 0 - 44 U/L 20  22          This result is from an external source.     Ref Range & Units   07/26/2022  Sodium 135 - 146 MMOL/L 139  Potassium 3.5 - 5.3 MMOL/L 4.4  Comment: NO VISIBLE HEMOLYSIS  Chloride 98 - 110 MMOL/L 103  CO2 21 - 31 MMOL/L 29  BUN 8 - 24 MG/DL 18  Glucose 70 - 99 MG/DL 82  Creatinine 0.70 - 1.30 MG/DL 1.13  Calcium 8.5 - 10.5 MG/DL 8.9  Total Protein 6.4 - 8.9 G/DL 6.7  Albumin 3.5 - 5.7 G/DL 4.1  Total Bilirubin 0.0 - 1.0 MG/DL 0.9  Alkaline Phosphatase 34 - 104 IU/L or U/L 54  AST (SGOT) 13 - 39 IU/L or U/L 23  ALT (SGPT) 7 - 52 IU/L or U/L 14   Lab Results  Component Value Date   CEA1 12.2 (H) 10/17/2022   CEA 11.2 02/11/2022   /  CEA  Date Value Ref Range Status  10/17/2022 12.2 (H) 0.0 - 4.7 ng/mL Final    Comment:    (NOTE)                             Nonsmokers          <3.9                             Smokers             <5.6 [employer] Electrochemiluminescence Immunoassay (ECLIA) Values obtained with different assay methods or kits cannot be used interchangeably.  Results cannot be interpreted as absolute evidence of the presence or absence of malignant disease. Performed At: [facility] [place], [place] [id number] Rush Farmer MD HQ:[id number]   02/11/2022 11.2  Final     Ref Range & Units   07/26/2022  FREE T4 0.6 - 1.3 NG/DL  1.1     Ref Range & Units   07/26/2022  TSH 0.45 - 5.00 UIU/ML 3.18   Component Ref Range & Units 07/26/2022  Testosterone 264 - 916 ng/dL 549      Ref Range & Units   07/26/2022  LDL Direct <100 mg/dL 74  Total Cholesterol <382 MG/DL 351  Triglycerides <050 MG/DL 61  HDL Cholesterol >=40 MG/DL 66  Total Chol / HDL Cholesterol <4.5 2.4  Non-HDL Cholesterol MG/DL 94   Component Ref Range & Units 3 mo ago  B Natriuretic Peptide 0.0 - 100.0 pg/mL 274.5 High     Ref Range & Units   07/26/2022    HEMOGLOBIN A1C <5.8 % 5.6    STUDIES:  Exam: 09/12/2022 CT Head Without Contrast CT Maxillofacial Without Contrast CT Cervical Spine Without Contrast Impression: No acute intracranial abnormality. No acute displaced facial fracture. No acute displaced fracture or traumatic listhesis of the cervical spine.       HISTORY:   Allergies: No Known Allergies  Current Medications: Current Outpatient Medications  Medication Sig Dispense Refill   acetaminophen (TYLENOL) 500 MG tablet Take 1,000 mg by mouth every 8 (eight) hours as needed for mild pain or moderate pain (for pain.).     amiodarone (PACERONE) 200 MG tablet Take 200 mg by mouth daily.     apixaban (ELIQUIS) 5 MG TABS tablet Take 1 tablet (5 mg total) by mouth 2 (two) times daily. 180 tablet 2   Cholecalciferol (VITAMIN D3) 50 MCG (2000 UT) TABS Take 4,000 Units by mouth every other day.     colesevelam (WELCHOL) 625 MG tablet Take 1,250 mg by mouth 2 (two) times daily with a meal.     Cyanocobalamin (VITAMIN B-12 IJ) Inject 1 each as directed every 30 (thirty) days.     furosemide (LASIX) 20 MG tablet Take 1 tablet (20 mg total) by mouth daily. 90 tablet 2   losartan (COZAAR) 50 MG tablet Take 1 tablet (50 mg total) by mouth daily. 90 tablet 3   Polyethyl Glycol-Propyl Glycol (SYSTANE ULTRA OP) Place 1 drop into both eyes daily. Unknown strength     potassium chloride SA (KLOR-CON M20) 20 MEQ tablet Take 1 tablet (20  mEq total) by mouth daily. 90 tablet 2   rosuvastatin (CRESTOR) 40 MG tablet Take 1 tablet (40 mg total) by mouth at bedtime.     valACYclovir (VALTREX) 1000 MG tablet Take 1,000 mg by mouth 2 (two) times daily as needed (for fever blisters.).      No current facility-administered medications for this visit.     ASSESSMENT & PLAN:   Assessment:    1. 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  He remains without evidence of disease over 4 1/2 years.  2.  Elevation of the CEA of uncertain etiology, which tends to fluctuate up and down, but is persistent and was up to 10.4 at his last check.  3. Multiple renal cysts being monitored by Dr. Katrinka Blazing, his urologist with [facility].  4. Multiple skin cancers. He had a squamous cell carcinoma resected from the upper right anterior chest.   5. History of COVID-19 from November 2020 and again in April 2022.  6.  Multiple colon polyps.  His last colonoscopy was in October 2019 with Dr. Chales Abrahams, which revealed two polyps consistent with tubular adenomas.  I therefore recommend that he undergo at least 1 more colonoscopy as he is a fairly healthy individual, especially with the persistently elevated CEA.    7.  Minimal  thrombocytopenia.  8.  Mild hyponatremia.  Plan: I will order a urinanalysis and culture, and a CT scan of chest, abdomin, and pelvis. Today's labs are pending of CBC, CMP, and CEA. If we can schedule his scan this week I would like to see him by Friday 10/21/2022 or very soon the week after. 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 I really doubt any of this is related to his lung cancer or even any type of malignancy, but he is anxious. Once we have the results, I will most likely refer him back to Dr. Shary Decamp. The patient understands the plans discussed today and is in agreement with them.  The patient knows to contact our office if his develops concerns prior to his next  appointment.  I provided 30 minutes of face-to-face time during this this encounter and > 50% was spent counseling as documented under my assessment and plan.    I,Jasmine M Lassiter,acting as a scribe for Dellia Beckwith, MD.,have documented all relevant documentation on the behalf of Dellia Beckwith, MD,as directed by  Dellia Beckwith, MD while in the presence of Dellia Beckwith, MD.

## 2022-10-16 ENCOUNTER — Other Ambulatory Visit: Payer: Self-pay | Admitting: Oncology

## 2022-10-16 DIAGNOSIS — C3490 Malignant neoplasm of unspecified part of unspecified bronchus or lung: Secondary | ICD-10-CM

## 2022-10-17 ENCOUNTER — Other Ambulatory Visit: Payer: Self-pay | Admitting: Oncology

## 2022-10-17 ENCOUNTER — Inpatient Hospital Stay: Payer: Medicare Other | Attending: Oncology | Admitting: Oncology

## 2022-10-17 ENCOUNTER — Encounter: Payer: Self-pay | Admitting: Oncology

## 2022-10-17 ENCOUNTER — Inpatient Hospital Stay: Payer: Medicare Other

## 2022-10-17 VITALS — BP 139/82 | HR 74 | Temp 97.9°F | Resp 18 | Ht 68.0 in | Wt 171.1 lb

## 2022-10-17 DIAGNOSIS — Z85118 Personal history of other malignant neoplasm of bronchus and lung: Secondary | ICD-10-CM | POA: Insufficient documentation

## 2022-10-17 DIAGNOSIS — R978 Other abnormal tumor markers: Secondary | ICD-10-CM | POA: Diagnosis not present

## 2022-10-17 DIAGNOSIS — C3490 Malignant neoplasm of unspecified part of unspecified bronchus or lung: Secondary | ICD-10-CM

## 2022-10-17 DIAGNOSIS — N281 Cyst of kidney, acquired: Secondary | ICD-10-CM | POA: Insufficient documentation

## 2022-10-17 DIAGNOSIS — R350 Frequency of micturition: Secondary | ICD-10-CM

## 2022-10-17 LAB — CBC WITH DIFFERENTIAL (CANCER CENTER ONLY)
Abs Immature Granulocytes: 0.02 10*3/uL (ref 0.00–0.07)
Basophils Absolute: 0 10*3/uL (ref 0.0–0.1)
Basophils Relative: 0 %
Eosinophils Absolute: 0.1 10*3/uL (ref 0.0–0.5)
Eosinophils Relative: 2 %
HCT: 46.7 % (ref 39.0–52.0)
Hemoglobin: 14.8 g/dL (ref 13.0–17.0)
Immature Granulocytes: 0 %
Lymphocytes Relative: 23 %
Lymphs Abs: 1.4 10*3/uL (ref 0.7–4.0)
MCH: 31 pg (ref 26.0–34.0)
MCHC: 31.7 g/dL (ref 30.0–36.0)
MCV: 97.9 fL (ref 80.0–100.0)
Monocytes Absolute: 0.7 10*3/uL (ref 0.1–1.0)
Monocytes Relative: 12 %
Neutro Abs: 3.7 10*3/uL (ref 1.7–7.7)
Neutrophils Relative %: 63 %
Platelet Count: 133 10*3/uL — ABNORMAL LOW (ref 150–400)
RBC: 4.77 MIL/uL (ref 4.22–5.81)
RDW: 14.3 % (ref 11.5–15.5)
WBC Count: 5.9 10*3/uL (ref 4.0–10.5)
nRBC: 0 % (ref 0.0–0.2)

## 2022-10-17 LAB — CMP (CANCER CENTER ONLY)
ALT: 20 U/L (ref 0–44)
AST: 27 U/L (ref 15–41)
Albumin: 3.4 g/dL — ABNORMAL LOW (ref 3.5–5.0)
Alkaline Phosphatase: 42 U/L (ref 38–126)
Anion gap: 10 (ref 5–15)
BUN: 16 mg/dL (ref 8–23)
CO2: 26 mmol/L (ref 22–32)
Calcium: 8.5 mg/dL — ABNORMAL LOW (ref 8.9–10.3)
Chloride: 104 mmol/L (ref 98–111)
Creatinine: 1.06 mg/dL (ref 0.61–1.24)
GFR, Estimated: >60 mL/min (ref >60)
Glucose, Bld: 93 mg/dL (ref 70–99)
Potassium: 4.3 mmol/L (ref 3.5–5.1)
Sodium: 140 mmol/L (ref 135–145)
Total Bilirubin: 0.7 mg/dL (ref 0.3–1.2)
Total Protein: 6.4 g/dL — ABNORMAL LOW (ref 6.5–8.1)

## 2022-10-17 LAB — URINALYSIS, COMPLETE (UACMP) WITH MICROSCOPIC
Bilirubin Urine: NEGATIVE
Glucose, UA: NEGATIVE mg/dL
Hgb urine dipstick: NEGATIVE
Ketones, ur: NEGATIVE mg/dL
Leukocytes,Ua: NEGATIVE
Nitrite: NEGATIVE
Protein, ur: NEGATIVE mg/dL
Specific Gravity, Urine: 1.018 (ref 1.005–1.030)
pH: 6 (ref 5.0–8.0)

## 2022-10-18 LAB — CEA: CEA: 12.2 ng/mL — ABNORMAL HIGH (ref 0.0–4.7)

## 2022-10-18 LAB — URINE CULTURE: Culture: <10000 — AB

## 2022-10-20 ENCOUNTER — Ambulatory Visit
Admission: RE | Admit: 2022-10-20 | Discharge: 2022-10-20 | Disposition: A | Discharge status: Home / Self Care | Payer: Medicare Other | Source: Ambulatory Visit | Attending: Oncology | Admitting: Oncology

## 2022-10-20 DIAGNOSIS — C3490 Malignant neoplasm of unspecified part of unspecified bronchus or lung: Secondary | ICD-10-CM

## 2022-10-20 MED ORDER — IOPAMIDOL (ISOVUE-370) INJECTION 76%
100.0000 mL | Freq: Once | INTRAVENOUS | Status: AC | PRN
  Administered 2022-10-20: 100 mL via INTRAVENOUS

## 2022-10-21 ENCOUNTER — Inpatient Hospital Stay (HOSPITAL_BASED_OUTPATIENT_CLINIC_OR_DEPARTMENT_OTHER): Payer: Medicare Other | Admitting: Oncology

## 2022-10-21 ENCOUNTER — Encounter: Payer: Self-pay | Admitting: Oncology

## 2022-10-21 VITALS — BP 144/92 | HR 83 | Temp 97.9°F | Resp 19 | Ht 68.0 in | Wt 171.9 lb

## 2022-10-21 DIAGNOSIS — C3490 Malignant neoplasm of unspecified part of unspecified bronchus or lung: Secondary | ICD-10-CM

## 2022-10-21 DIAGNOSIS — R978 Other abnormal tumor markers: Secondary | ICD-10-CM

## 2022-10-21 NOTE — Progress Notes (Signed)
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  We began seeing him in March 2019 for follow-up.  He was found to have an enlarging right lower lobe nodule in January 2019 and had a wedge resection by VATS procedure by Dr. Modesto Charon in February.  Pathology revealed an invasive moderately differentiated adenocarcinoma measuring 1 cm with 14 - nodes.  His CEA was elevated at 9 postoperatively.He is on observation only.  He is also being followed by the urologist for multiple renal cysts.  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  Colonoscopy in May 2018 revealed colon polyps with pathology revealing tubular adenomas.  Upper endoscopy and colonoscopy in 2019 did not reveal any malignancy, so we feel we have ruled out a gastrointestinal source as the cause of the increased CEA.  The CEA increased to 10.5 in November, but then was back down to 8.9 in February.  Repeat CT imaging of the chest, abdomen and pelvis did not reveal any evidence of malignancy.  Previously seen right pleural effusion had decreased, there was a similar appearance of the suspected rounded atelectasis at the right lung base, which was also decreased.  CEA at that time was 9.9.  There was stable bilateral renal cysts.  He had surgery of the left lateral and posterior neck for additional squamous cell carcinomas of the skin.  Repeat CT chest, abdomen and pelvis in October revealed stable rounded presumed atelectasis in the right lower lobe along the resection site with stable scarring, stable volume loss in the right lower lobe and right middle  lobe, as well as stable small adjacent right pleural effusion with adjacent pleural thickening without changes suggestive of active malignancy.  The bilateral renal cysts were stable.  CT imaging from May 2021 revealed stable appearance of right pleural thickening, trace pleural effusion and adjacent density along the wedge resection site favoring rounded atelectasis and scarring in the right lower lobe.  No compelling findings of active malignancy.  CEA was 10.5, previously 9.7, but this tends to fluctuate up and down in this range.  He has some residual effects from COVID-19 that he had in November 2020. CEA from May 2022 was up to 11.0 from 10.3 the year before. CT scans of abdomen and pelvis were negative.  CT chest in September 2021 showed scarring only.  We continued to monitor regularly.  INTERVAL HISTORY:  Louis Barton is here to review the CT scan results from earlier this week. He came in complaining of left flank pain last week, but I have no explanation for this. 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 His urinalysis and culture were negative with no evidence of hematuria or proteinuria. His right lung reveals stable post surgical changes with adjacent plural thickening and nodular opacity. He has no evidence of metastatic disease in the chest, abdomen, and pelvis. He reports no abdominal pain or any pain after eating. 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 He also has mild fatty  liver, diverticulosis, and significant degenerative changes of the spine and pelvis. 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 Bea Graff. His last colonoscopy was June, 2022. He was anxious about the fact hat his CEA went up from 10.4 to 12.2. This has remained mildly elevated for years now and  fluctuates up and down.  I reassured him. The rest of his labs looked good.  I will see him back in 6 months with CBC, CMP, and CEA.  He denies signs of infection such as sore throat, sinus drainage, cough, or urinary symptoms.  He denies fevers or recurrent chills. He denies pain. He denies nausea, vomiting, chest pain, dyspnea or cough. His weight has been stable.   REVIEW OF SYSTEMS:  Review of Systems  Constitutional: Negative.  Negative for appetite change, chills, diaphoresis, fatigue, fever and unexpected weight change.  HENT:  Negative.  Negative for hearing loss, lump/mass, mouth sores, nosebleeds, sore throat, tinnitus, trouble swallowing and voice change.   Eyes: Negative.  Negative for eye problems and icterus.  Respiratory:  Negative for chest tightness, cough, hemoptysis, shortness of breath and wheezing.   Cardiovascular: Negative.  Negative for chest pain, leg swelling and palpitations.  Gastrointestinal: Negative.  Negative for abdominal distention, abdominal pain, blood in stool, constipation, diarrhea, nausea, rectal pain and vomiting.  Endocrine: Negative.   Genitourinary: Negative.  Negative for bladder incontinence, difficulty urinating, dyspareunia, dysuria, frequency, hematuria, nocturia, pelvic pain and penile discharge.   Musculoskeletal: Negative.  Negative for arthralgias, back pain, flank pain, gait problem, myalgias, neck pain and neck stiffness.  Skin: Negative.  Negative for itching, rash and wound.  Neurological: Negative.  Negative for dizziness, extremity weakness, gait problem, headaches, light-headedness, numbness, seizures and speech difficulty.  Hematological: Negative.  Negative for adenopathy. Does not bruise/bleed easily.  Psychiatric/Behavioral:  Negative for confusion, decreased concentration, depression, sleep disturbance and suicidal ideas. The patient is nervous/anxious (occasional).   All other systems reviewed and are negative.    VITALS:  Blood  pressure (!) 144/92, pulse 83, temperature 97.9 F (36.6 C), temperature source Oral, resp. rate 19, height 5\' 8"  (1.727 m), weight 171 lb 14.4 oz (78 kg), SpO2 98 %.  Wt Readings from Last 3 Encounters:  10/21/22 171 lb 14.4 oz (78 kg)  10/17/22 171 lb 1.6 oz (77.6 kg)  08/23/22 170 lb 9.6 oz (77.4 kg)    Body mass index is 26.14 kg/m.  Performance status (ECOG): 1 - Symptomatic but completely ambulatory  PHYSICAL EXAM:  Physical exam was deferred to discuss the results of his scans. Physical Exam Constitutional:      General: He is not in acute distress.    Appearance: Normal appearance. He is normal weight. He is not ill-appearing, toxic-appearing or diaphoretic.  HENT:     Head: Normocephalic and atraumatic.     Right Ear: Tympanic membrane, ear canal and external ear normal. There is no impacted cerumen.     Left Ear: Tympanic membrane, ear canal and external ear normal. There is no impacted cerumen.     Nose: Nose normal. No congestion or rhinorrhea.     Mouth/Throat:     Mouth: Mucous membranes are moist.     Pharynx: Oropharynx is clear. No oropharyngeal exudate or posterior oropharyngeal erythema.  Eyes:     General: No scleral icterus.       Right  eye: No discharge.        Left eye: No discharge.     Extraocular Movements: Extraocular movements intact.     Conjunctiva/sclera: Conjunctivae normal.     Pupils: Pupils are equal, round, and reactive to light.  Neck:     Vascular: No carotid bruit.  Cardiovascular:     Rate and Rhythm: Normal rate and regular rhythm.     Pulses: Normal pulses.     Heart sounds: Normal heart sounds. No murmur heard.    No friction rub. No gallop.  Pulmonary:     Effort: Pulmonary effort is normal. No respiratory distress.     Breath sounds: No stridor. No decreased breath sounds, wheezing, rhonchi or rales.  Chest:     Chest wall: No tenderness.  Abdominal:     General: Bowel sounds are normal. There is no distension.      Palpations: Abdomen is soft. There is no hepatomegaly, splenomegaly or mass.     Tenderness: There is no abdominal tenderness. There is no right CVA tenderness, left CVA tenderness, guarding or rebound.     Hernia: No hernia is present.  Musculoskeletal:        General: No swelling, tenderness, deformity or signs of injury. Normal range of motion.     Cervical back: Normal range of motion and neck supple. No rigidity or tenderness.     Right lower leg: No edema.     Left lower leg: No edema.  Lymphadenopathy:     Cervical: No cervical adenopathy.  Skin:    General: Skin is warm and dry.     Coloration: Skin is not jaundiced or pale.     Findings: No bruising, erythema, lesion or rash.  Neurological:     General: No focal deficit present.     Mental Status: He is alert and oriented to person, place, and time. Mental status is at baseline.     Cranial Nerves: No cranial nerve deficit.     Sensory: No sensory deficit.     Motor: No weakness.     Coordination: Coordination normal.     Gait: Gait normal.     Deep Tendon Reflexes: Reflexes normal.  Psychiatric:        Mood and Affect: Mood normal.        Behavior: Behavior normal.        Thought Content: Thought content normal.        Judgment: Judgment normal.     LABS:      Latest Ref Rng & Units 10/17/2022    9:09 AM 07/04/2022   12:00 AM 02/11/2022   12:00 AM  CBC  WBC 4.0 - 10.5 K/uL 5.9  6.0     6.5      Hemoglobin 13.0 - 17.0 g/dL 14.8  15.6     14.8      Hematocrit 39.0 - 52.0 % 46.7  47     47      Platelets 150 - 400 K/uL 133  129     136         This result is from an external source.      Latest Ref Rng & Units 10/17/2022    9:09 AM 07/04/2022   12:00 AM 03/25/2022   10:21 AM  CMP  Glucose 70 - 99 mg/dL 93   85   BUN 8 - 23 mg/dL 16  17     14    Creatinine 0.61 - 1.24 mg/dL 1.06  1.0     1.04   Sodium 135 - 145 mmol/L 140  134     141   Potassium 3.5 - 5.1 mmol/L 4.3  3.9     4.5   Chloride 98 - 111 mmol/L 104   105     101   CO2 22 - 32 mmol/L 26  30     26    Calcium 8.9 - 10.3 mg/dL 8.5  8.6     9.0   Total Protein 6.5 - 8.1 g/dL 6.4     Total Bilirubin 0.3 - 1.2 mg/dL 0.7     Alkaline Phos 38 - 126 U/L 42  62       AST 15 - 41 U/L 27  33       ALT 0 - 44 U/L 20  22          This result is from an external source.   Lab Results  Component Value Date   CEA1 12.2 (H) 10/17/2022   CEA 11.2 02/11/2022   /  CEA  Date Value Ref Range Status  10/17/2022 12.2 (H) 0.0 - 4.7 ng/mL Final    Comment:    (NOTE)                             Nonsmokers          <3.9                             Smokers             <5.6 [employer] Electrochemiluminescence Immunoassay (ECLIA) Values obtained with different assay methods or kits cannot be used interchangeably.  Results cannot be interpreted as absolute evidence of the presence or absence of malignant disease. Performed At: [facility] [place], [place] [id number] Rush Farmer MD KK:[id number]   02/11/2022 11.2  Final    Ref Range & Units   07/26/2022  HEMOGLOBIN A1C <5.8 % 5.6  eAG MG/DL 114     Ref Range & Units   07/26/2022  LDL Direct <100 mg/dL 74  Total Cholesterol <200 MG/DL 160  Triglycerides <150 MG/DL 61  HDL Cholesterol >=60 MG/DL 66  Total Chol / HDL Cholesterol <4.5 2.4  Non-HDL Cholesterol MG/DL 94     Ref Range & Units   07/26/2022  TSH 0.45 - 5.00 UIU/ML 3.18     Ref Range & Units   07/26/2022  FREE T4 0.6 - 1.3 NG/DL 1.1    STUDIES:  CUP PACEART REMOTE DEVICE CHECK  Result Date: 10/26/2022 Scheduled remote reviewed. Normal device function.  Next remote 91 days. LA  CT CHEST ABDOMEN PELVIS W CONTRAST  Result Date: 10/20/2022 CLINICAL DATA:  Staging non metastatic non-small-cell lung cancer. Left flank pain for 2 months with some weight loss. Pacemaker. EXAM: CT CHEST, ABDOMEN, AND PELVIS WITH CONTRAST TECHNIQUE: Multidetector CT imaging of the chest, abdomen and pelvis was  performed following the standard protocol during bolus administration of intravenous contrast. RADIATION DOSE REDUCTION: This exam was performed according to the departmental dose-optimization program which includes automated exposure control, adjustment of the mA and/or kV according to patient size and/or use of iterative reconstruction technique. CONTRAST:  127mL ISOVUE-370 IOPAMIDOL (ISOVUE-370) INJECTION 76% COMPARISON:  CT 02/11/2022 chest. Multiple older chest CTs as well. Abdomen and pelvis CT 03/19/2021 and older. FINDINGS: CT CHEST FINDINGS Cardiovascular: Left upper  chest pacemaker. Heart is slightly enlarged. No pericardial effusion. The thoracic aorta has normal course and caliber with scattered atherosclerotic calcified plaque including extending along the origin of the brachiocephalic and left subclavian arteries. Mediastinum/Nodes: No specific abnormal lymph node enlargement identified in the axillary region or hilum or mediastinum. Small hiatal hernia. Normal caliber thoracic esophagus. Lungs/Pleura: The left lung is grossly clear. No consolidation, pneumothorax or effusion. The right lung once again has a tiny pleural effusion at the base with pleural thickening. There is adjacent opacity with surgical changes which somewhat nodular. Based on the appearance is very well could be rounded atelectasis. Adjacent areas of scarring and fibrotic changes. There is also volume loss along the right hemithorax compared to left. No dominant new nodule. Musculoskeletal: Mild curvature of the spine with some scattered degenerative change. CT ABDOMEN PELVIS FINDINGS Hepatobiliary: Mild fatty liver infiltration. No space-occupying liver lesion. Gallbladder is nondilated. Patent portal vein. Contrast bolus morning late arterial phase. Pancreas: Global pancreatic fatty atrophy without mass lesion or ductal dilatation. Spleen: Spleen is nonenlarged. Adrenals/Urinary Tract: Adrenal glands are preserved. Mild renal  atrophy bilaterally. Multiple parapelvic renal cysts. Small parenchymal exophytic cysts along the lower pole of the right kidney laterally for example posteriorly on series 11, image 25 measures 17 mm in maximal transverse dimension with Hounsfield units of 19 on the first postcontrast dataset. Small lesion anterolateral as well is similar. Both of these were seen on the study of May 2021 as well. Bosniak 1. No specific imaging follow-up. Additional exophytic Bosniak 2 lesion from the anterior aspect of the left kidney measuring maximal transverse dimension of 4.3 cm with Hounsfield unit of 25. This also stable going back to study of May 2021. No specific recommended imaging follow-up by itself. The ureters are normal course and caliber of the bladder. Preserved contours of the urinary bladder. Stomach/Bowel: There is a redundant course to the sigmoid colon extending into the left upper quadrant. This sigmoid colon is collapsed. More proximally the colon is nondilated. A few scattered colonic diverticula. Moderate stool. Normal appendix seen caudal to the cecum. On coronal imaging the appendix appears to extend along the opening of a small hernia along the anterior right hemipelvis as seen on sagittal images 65 through 75 of series 8. Again the appendix is nondilated. Stomach is relatively collapsed. There is a presumed mural lipoma along the proximal third portion of the duodenal. Small bowel is nondilated. Vascular/Lymphatic: Normal caliber aorta and IVC with scattered atherosclerotic calcified plaque. 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 The IMA is patent proximally. Please correlate for any symptoms. No specific abnormal lymph node enlargement present in the abdomen and pelvis. Reproductive: Prostate is unremarkable. Other: No ascites. Musculoskeletal: Curvature of spine. Scattered degenerative changes of the spine and pelvis.  There are bridging osteophytes along the sacroiliac joints. Multilevel canal narrowing along the lumbar spine from degenerative change. Disc bulging and osteophytes. IMPRESSION: 1. No evidence of metastatic disease in the chest, abdomen or pelvis. 2. Stable postsurgical changes of the right lung with adjacent pleural thickening and nodular opacity, favored to represent rounded atelectasis. 3. Mild fatty liver infiltration. 4. Scattered colonic diverticula without evidence of acute diverticulitis. 5. 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 The IMA is patent proximally. Please correlate for any symptoms of mesenteric ischemia. 6. Small hernia along the anterior  right hemipelvis containing the appendix. 7. Aortic atherosclerosis. Aortic Atherosclerosis (ICD10-I70.0). Electronically Signed   By: Jill Side M.D.   On: 10/20/2022 15:55      HISTORY:   Allergies: No Known Allergies  Current Medications: Current Outpatient Medications  Medication Sig Dispense Refill   acetaminophen (TYLENOL) 500 MG tablet Take 1,000 mg by mouth every 8 (eight) hours as needed for mild pain or moderate pain (for pain.).     amiodarone (PACERONE) 200 MG tablet Take 200 mg by mouth daily.     apixaban (ELIQUIS) 5 MG TABS tablet Take 1 tablet (5 mg total) by mouth 2 (two) times daily. 180 tablet 2   Cholecalciferol (VITAMIN D3) 50 MCG (2000 UT) TABS Take 4,000 Units by mouth every other day.     colesevelam (WELCHOL) 625 MG tablet Take 1,250 mg by mouth 2 (two) times daily with a meal.     Cyanocobalamin (VITAMIN B-12 IJ) Inject 1 each as directed every 30 (thirty) days.     furosemide (LASIX) 20 MG tablet Take 1 tablet (20 mg total) by mouth daily. 90 tablet 2   losartan (COZAAR) 50 MG tablet Take 1 tablet (50 mg total) by mouth daily. 90 tablet 3   Polyethyl Glycol-Propyl Glycol (SYSTANE ULTRA OP) Place 1 drop into both eyes daily. Unknown  strength     potassium chloride SA (KLOR-CON M20) 20 MEQ tablet Take 1 tablet (20 mEq total) by mouth daily. 90 tablet 2   rosuvastatin (CRESTOR) 40 MG tablet Take 1 tablet (40 mg total) by mouth at bedtime.     valACYclovir (VALTREX) 1000 MG tablet Take 1,000 mg by mouth 2 (two) times daily as needed (for fever blisters.).      No current facility-administered medications for this visit.     ASSESSMENT & PLAN:   Assessment:    1. 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  He remains without evidence of disease for nearly 5 years. His right lung reveals stable post surgical changes with adjacent plural thickening and nodular opacity. He has no evidence of metastatic disease in the chest, abdomen, and pelvis.  2.  Elevation of the CEA of uncertain etiology, which tends to fluctuate up and down, but is persistent and was up to 12.2 from 10.4 at this time.  3. Multiple renal cysts being monitored by Dr. Tamala Julian, his urologist with [facility].  4. Multiple skin cancers. He had a squamous cell carcinoma resected from the upper right anterior chest.   5. Multiple colon polyps.  His last colonoscopy was in October 2019 with Dr. Lyndel Safe, which revealed two polyps consistent with tubular adenomas.  I therefore recommend that he undergo at least 1 more colonoscopy even though he is in his 13's, as he is a fairly healthy individual, especially with the persistently elevated CEA.    6.  Minimal thrombocytopenia.   Plan: His right lung reveals stable post surgical changes with adjacent plural thickening and nodular opacity. He has no evidence of metastatic disease in the chest, abdomen, and pelvis. He was anxious about the fact hat his CEA went up from 10.4 to 12.2. This has remained mildly elevated for years now and fluctuates up and down.  I reassured him that we will continue to monitor it. The rest of his labs looked good.  I will see him back in 6  months with CBC, CMP, and CEA. The patient understands the plans discussed today and is in  agreement with them.  The patient knows to contact our office if his develops concerns prior to his next appointment.   I provided 15 minutes of face-to-face time during this this encounter and > 50% was spent counseling as documented under my assessment and plan.   I,Jasmine M Lassiter,acting as a scribe for Derwood Kaplan, MD.,have documented all relevant documentation on the behalf of Derwood Kaplan, MD,as directed by  Derwood Kaplan, MD while in the presence of Derwood Kaplan, MD.

## 2022-10-26 LAB — CUP PACEART REMOTE DEVICE CHECK
Battery Remaining Longevity: 115 mo
Battery Voltage: 3.01 V
Brady Statistic AP VP Percent: 95.4 %
Brady Statistic AP VS Percent: 1.74 %
Brady Statistic AS VP Percent: 2.6 %
Brady Statistic AS VS Percent: 0.26 %
Brady Statistic RA Percent Paced: 97.12 %
Brady Statistic RV Percent Paced: 98 %
Date Time Interrogation Session: 20240130015247
Implantable Lead Connection Status: 753985
Implantable Lead Connection Status: 753985
Implantable Lead Implant Date: 20221102
Implantable Lead Implant Date: 20221121
Implantable Lead Location: 753859
Implantable Lead Location: 753860
Implantable Lead Model: 3830
Implantable Lead Model: 5076
Implantable Pulse Generator Implant Date: 20221102
Lead Channel Impedance Value: 266 Ohm
Lead Channel Impedance Value: 342 Ohm
Lead Channel Impedance Value: 418 Ohm
Lead Channel Impedance Value: 475 Ohm
Lead Channel Pacing Threshold Amplitude: 0.875 V
Lead Channel Pacing Threshold Amplitude: 1.125 V
Lead Channel Pacing Threshold Pulse Width: 0.4 ms
Lead Channel Pacing Threshold Pulse Width: 0.4 ms
Lead Channel Sensing Intrinsic Amplitude: 1.125 mV
Lead Channel Sensing Intrinsic Amplitude: 1.125 mV
Lead Channel Sensing Intrinsic Amplitude: 10.625 mV
Lead Channel Sensing Intrinsic Amplitude: 10.625 mV
Lead Channel Setting Pacing Amplitude: 2 V
Lead Channel Setting Pacing Amplitude: 2.5 V
Lead Channel Setting Pacing Pulse Width: 0.4 ms
Lead Channel Setting Sensing Sensitivity: 0.9 mV
Zone Setting Status: 755011
Zone Setting Status: 755011

## 2022-10-27 ENCOUNTER — Ambulatory Visit (INDEPENDENT_AMBULATORY_CARE_PROVIDER_SITE_OTHER): Payer: Medicare Other

## 2022-10-27 DIAGNOSIS — I459 Conduction disorder, unspecified: Secondary | ICD-10-CM | POA: Diagnosis not present

## 2022-11-02 ENCOUNTER — Other Ambulatory Visit: Payer: Self-pay

## 2022-11-02 MED ORDER — FUROSEMIDE 20 MG PO TABS: 20.0000 mg | ORAL_TABLET | Freq: Every day | ORAL | 2 refills | Status: DC

## 2022-11-09 ENCOUNTER — Ambulatory Visit: Payer: Medicare Other | Attending: Cardiology

## 2022-11-09 VITALS — BP 140/82 | HR 76 | Ht 68.0 in | Wt 172.0 lb

## 2022-11-09 DIAGNOSIS — R0609 Other forms of dyspnea: Secondary | ICD-10-CM | POA: Diagnosis present

## 2022-11-09 NOTE — Progress Notes (Signed)
Patient ID: Louis Barton, male   DOB: 1941-03-08, 82 y.o.   MRN: [id number]   Nurse Visit   Date of Encounter: 11/09/2022 ID: Louis Barton, DOB 1940/12/19, MRN [id number]  PCP:  Raina Mina., MD   [facility] Cardiologist:  Jenne Campus, MD Electrophysiologist:  Constance Haw, MD      Visit Details   VS:  There were no vitals taken for this visit. , BMI There is no height or weight on file to calculate BMI.  Wt Readings from Last 3 Encounters:  10/21/22 171 lb 14.4 oz (78 kg)  10/17/22 171 lb 1.6 oz (77.6 kg)  08/23/22 170 lb 9.6 oz (77.4 kg)     Reason for visit: BP check- running high at home Performed today:  BMP, ProBNP, Vitals, EKG, Provider consulted: [name], and Education Changes (medications, testing, etc.) : None Length of Visit: 15 minutes    Medications Adjustments/Labs and Tests Ordered: Orders Placed This Encounter  Procedures   Basic metabolic panel   Pro b natriuretic peptide (BNP)   No orders of the defined types were placed in this encounter.    Signed, Tyler Pita, RN  11/09/2022 3:03 PM

## 2022-11-11 ENCOUNTER — Other Ambulatory Visit: Payer: Self-pay

## 2022-11-11 ENCOUNTER — Telehealth: Payer: Self-pay | Admitting: Cardiology

## 2022-11-11 DIAGNOSIS — R0609 Other forms of dyspnea: Secondary | ICD-10-CM

## 2022-11-11 LAB — BASIC METABOLIC PANEL
BUN/Creatinine Ratio: 15 (ref 10–24)
BUN: 16 mg/dL (ref 8–27)
CO2: 24 mmol/L (ref 20–29)
Calcium: 8.9 mg/dL (ref 8.6–10.2)
Chloride: 102 mmol/L (ref 96–106)
Creatinine, Ser: 1.08 mg/dL (ref 0.76–1.27)
Glucose: 134 mg/dL — ABNORMAL HIGH (ref 70–99)
Potassium: 4.2 mmol/L (ref 3.5–5.2)
Sodium: 140 mmol/L (ref 134–144)
eGFR: 68 mL/min/{1.73_m2} (ref >59)

## 2022-11-11 LAB — PRO B NATRIURETIC PEPTIDE: NT-Pro BNP: 1503 pg/mL — ABNORMAL HIGH (ref 0–486)

## 2022-11-11 MED ORDER — FUROSEMIDE 20 MG PO TABS: 40.0000 mg | ORAL_TABLET | Freq: Every day | ORAL | 2 refills | Status: DC

## 2022-11-11 NOTE — Telephone Encounter (Signed)
Patient very concerned about labs drawn on Wednesday and would like to hear his results from [place].  Best number [phone]

## 2022-11-11 NOTE — Telephone Encounter (Signed)
Sent Rx to [facility] reflecting increased dose of Lasix 40mg  daily. BMP ordered per [name] note.

## 2022-11-11 NOTE — Telephone Encounter (Signed)
Patient returned RN's call. 

## 2022-11-11 NOTE — Telephone Encounter (Signed)
Discussed lab work and med changes with pt. He agreed to increase his Lasix to 40mg  q d and come for BMP in 1 week as per [name] note. Pt verbalized understanding and had no further questions.

## 2022-11-15 ENCOUNTER — Telehealth: Payer: Self-pay | Admitting: Cardiology

## 2022-11-15 NOTE — Telephone Encounter (Signed)
Pt states that his Losartan was increased in December to 100 mg daily. Pt states his BP is 130/64-151/74 on 00 mg Losartan. Please advise if you want 100 mg dose or 50 mg dose.

## 2022-11-15 NOTE — Telephone Encounter (Signed)
And also asked to bump up the 50mg  to 100mg .

## 2022-11-15 NOTE — Telephone Encounter (Signed)
Patient came in requesting a refill on his losartan 50mg  and amiodarone 200mg  sent into [facility] on [address] CB # [phone]

## 2022-11-16 MED ORDER — AMIODARONE HCL 200 MG PO TABS: 200.0000 mg | ORAL_TABLET | Freq: Every day | ORAL | 2 refills | Status: DC

## 2022-11-16 MED ORDER — LOSARTAN POTASSIUM 100 MG PO TABS: 100.0000 mg | ORAL_TABLET | Freq: Every day | ORAL | 2 refills | Status: DC

## 2022-11-16 NOTE — Addendum Note (Signed)
Addended by: Truddie Hidden on: 11/16/2022 02:59 PM   Modules accepted: Orders

## 2022-11-16 NOTE — Progress Notes (Signed)
Remote pacemaker transmission.   

## 2022-11-23 LAB — BASIC METABOLIC PANEL
BUN/Creatinine Ratio: 16 (ref 10–24)
BUN: 16 mg/dL (ref 8–27)
CO2: 26 mmol/L (ref 20–29)
Calcium: 8.7 mg/dL (ref 8.6–10.2)
Chloride: 104 mmol/L (ref 96–106)
Creatinine, Ser: 1.02 mg/dL (ref 0.76–1.27)
Glucose: 81 mg/dL (ref 70–99)
Potassium: 4.5 mmol/L (ref 3.5–5.2)
Sodium: 143 mmol/L (ref 134–144)
eGFR: 73 mL/min/{1.73_m2} (ref >59)

## 2022-11-28 ENCOUNTER — Telehealth: Payer: Self-pay

## 2022-11-28 NOTE — Telephone Encounter (Signed)
Pt viewed results in My Chart. Routed to PCP.

## 2022-12-02 ENCOUNTER — Ambulatory Visit: Payer: Medicare Other | Admitting: Oncology

## 2022-12-05 ENCOUNTER — Other Ambulatory Visit: Payer: Self-pay

## 2022-12-05 MED ORDER — POTASSIUM CHLORIDE CRYS ER 20 MEQ PO TBCR: 20.0000 meq | EXTENDED_RELEASE_TABLET | Freq: Every day | ORAL | 1 refills | Status: DC

## 2022-12-07 ENCOUNTER — Telehealth: Payer: Self-pay | Admitting: Cardiology

## 2022-12-07 NOTE — Telephone Encounter (Signed)
Patient is calling to talk with Dr. Camnitz or nurse. Please call back 

## 2022-12-07 NOTE — Telephone Encounter (Signed)
Called patient, patient stated he wouldn't be home until after 5pm informed patient that someone would call him tomorrow.

## 2022-12-07 NOTE — Telephone Encounter (Signed)
Pt reports the "funny beating" occurring again. "Gives you the feeling that you have 2 beats, without hesitation in the middle of those 2 beats". He usually notices it at bedtime. Pt will send in transmission to see what is occurring. Aware I will follow up by end of week about this matter. Patient verbalized understanding and agreeable to plan.

## 2022-12-08 ENCOUNTER — Telehealth: Payer: Self-pay

## 2022-12-08 NOTE — Telephone Encounter (Signed)
Patient is returning call. Please advise? 

## 2022-12-08 NOTE — Telephone Encounter (Signed)
error 

## 2022-12-08 NOTE — Telephone Encounter (Signed)
Transmission received.  Per transmission Pt has had 0% afib since October 25, 2022. Pt is dependent. V pacing percentage is 95.9%-which may indicate PVC's with a burden around 4%.  Normal device function.  No PVC's or PAC's on presenting rhythm.

## 2022-12-08 NOTE — Telephone Encounter (Signed)
Left message to call back  

## 2022-12-09 NOTE — Telephone Encounter (Signed)
Forwarding to MD for review  This is keeping him up at night. States he has CHF and wants to know how much effect water build up has on his heart rates. He is SOB "Has been dealing with this for a long time" and prefers something to be done  Aware will have MD review and call him back today with recommendation. Pt agreeable to plan.

## 2022-12-09 NOTE — Telephone Encounter (Signed)
Pt aware scheduler will call him Monday to arrange OV w/ Dr. Curt Bears next Friday to further discuss this. Patient verbalized understanding and agreeable to plan.

## 2022-12-09 NOTE — Telephone Encounter (Signed)
Patient calling back.   °

## 2022-12-12 ENCOUNTER — Telehealth: Payer: Self-pay

## 2022-12-12 NOTE — Telephone Encounter (Signed)
Patient aware will need to meet 727.85 OOP prescription for 2024 to receive assistance from [facility]

## 2022-12-12 NOTE — Telephone Encounter (Signed)
Pt scheduled to see MD next Monday to further discuss. Patient verbalized understanding and agreeable to plan.

## 2022-12-19 ENCOUNTER — Ambulatory Visit: Payer: Medicare Other | Attending: Cardiology | Admitting: Cardiology

## 2022-12-19 ENCOUNTER — Encounter: Payer: Self-pay | Admitting: Cardiology

## 2022-12-19 VITALS — BP 118/74 | HR 74 | Ht 68.0 in | Wt 172.0 lb

## 2022-12-19 DIAGNOSIS — I4819 Other persistent atrial fibrillation: Secondary | ICD-10-CM | POA: Diagnosis not present

## 2022-12-19 DIAGNOSIS — Z79899 Other long term (current) drug therapy: Secondary | ICD-10-CM | POA: Diagnosis not present

## 2022-12-19 LAB — CUP PACEART INCLINIC DEVICE CHECK
Battery Remaining Longevity: 117 mo
Battery Voltage: 3.01 V
Brady Statistic AP VP Percent: 95.01 %
Brady Statistic AP VS Percent: 2.06 %
Brady Statistic AS VP Percent: 2.56 %
Brady Statistic AS VS Percent: 0.37 %
Brady Statistic RA Percent Paced: 97.07 %
Brady Statistic RV Percent Paced: 97.57 %
Date Time Interrogation Session: 20240325125510
Implantable Lead Connection Status: 753985
Implantable Lead Connection Status: 753985
Implantable Lead Implant Date: 20221102
Implantable Lead Implant Date: 20221121
Implantable Lead Location: 753859
Implantable Lead Location: 753860
Implantable Lead Model: 3830
Implantable Lead Model: 5076
Implantable Pulse Generator Implant Date: 20221102
Lead Channel Impedance Value: 285 Ohm
Lead Channel Impedance Value: 380 Ohm
Lead Channel Impedance Value: 437 Ohm
Lead Channel Impedance Value: 513 Ohm
Lead Channel Pacing Threshold Amplitude: 0.875 V
Lead Channel Pacing Threshold Amplitude: 1 V
Lead Channel Pacing Threshold Amplitude: 1 V
Lead Channel Pacing Threshold Pulse Width: 0.4 ms
Lead Channel Pacing Threshold Pulse Width: 0.4 ms
Lead Channel Pacing Threshold Pulse Width: 0.4 ms
Lead Channel Sensing Intrinsic Amplitude: 0.5 mV
Lead Channel Sensing Intrinsic Amplitude: 0.75 mV
Lead Channel Sensing Intrinsic Amplitude: 10.625 mV
Lead Channel Sensing Intrinsic Amplitude: 10.625 mV
Lead Channel Setting Pacing Amplitude: 2 V
Lead Channel Setting Pacing Amplitude: 2.25 V
Lead Channel Setting Pacing Pulse Width: 0.4 ms
Lead Channel Setting Sensing Sensitivity: 0.9 mV
Zone Setting Status: 755011
Zone Setting Status: 755011

## 2022-12-19 NOTE — Patient Instructions (Signed)
Medication Instructions:  Your physician recommends that you continue on your current medications as directed. Please refer to the Current Medication list given to you today.  *If you need a refill on your cardiac medications before your next appointment, please call your pharmacy*   Lab Work: Today: TSH (amiodarone surveillance lab)  If you have labs (blood work) drawn today and your tests are completely normal, you will receive your results only by: [place] (if you have MyChart) OR A paper copy in the mail If you have any lab test that is abnormal or we need to change your treatment, we will call you to review the results.   Testing/Procedures: None ordered   Follow-Up: At [facility], you and your health needs are our priority.  As part of our continuing mission to provide you with exceptional heart care, we have created designated Provider Care Teams.  These Care Teams include your primary Cardiologist (physician) and Advanced Practice Providers (APPs -  Physician Assistants and Nurse Practitioners) who all work together to provide you with the care you need, when you need it.  Remote monitoring is used to monitor your Pacemaker or ICD from home. This monitoring reduces the number of office visits required to check your device to one time per year. It allows [place] to keep an eye on the functioning of your device to ensure it is working properly. You are scheduled for a device check from home on 01/26/2023. You may send your transmission at any time that day. If you have a wireless device, the transmission will be sent automatically. After your physician reviews your transmission, you will receive a postcard with your next transmission date.  Your next appointment:   6 month(s)  The format for your next appointment:   In Person  Provider:   Allegra Lai, MD    Thank you for choosing [facility]!!   Trinidad Curet, RN [phone]

## 2022-12-19 NOTE — Progress Notes (Signed)
Electrophysiology Office Note   Date:  12/19/2022   ID:  AVEN ESSA, DOB 10/27/1940, MRN [id number]  PCP:  Raina Mina., MD  Cardiologist: Agustin Cree Primary Electrophysiologist:  Chera Slivka Meredith Leeds, MD    Chief Complaint: Fatigue   History of Present Illness: Louis Barton is a 82 y.o. male who is being seen today for the evaluation of second-degree heart block at the request of Raina Mina., MD. Presenting today for electrophysiology evaluation.  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  He presented [facility] with atrial flutter and slow ventricular response.    He was hospitalized in 2020 with confusion.  He was noted to be in 2 1 AV block.  Diagnosed with COVID at the time.  He is status post Medtronic dual-chamber pacemaker implanted 07/28/2021 with complete revision on 08/16/2021 due to loss of RV capture.  He is currently on amiodarone for his atrial flutter.  On follow up today, he is doing well overall. He submitted a pacemaker interrogation ~2 weeks ago due to nighttime sensation of "whooshing" noise he can appreciate after lying down. He describes it almost as two normal heart beats consecutively that he can appreciate. No pain, no palpitations, no shortness of breath associated with it. No increase in swelling of lower extremities with this. He has noticed this before pacemaker implantation. He states has noticed this more in the past 2-3 years and Edilson Vital happen more often than not during the week. He does admit to having 1-2 drinks of scotch nightly. He did not have any episodes the past two nights and notes he did not have anything to drink the past 2 nights. He is otherwise able to do normal day to day activities without issue. He has no bleeding concerns.   Today, denies symptoms of palpitations, chest pain, shortness of breath,  orthopnea, PND, lower extremity edema, claudication, dizziness, presyncope, syncope, bleeding, or neurologic sequela. The patient is tolerating medications without difficulties.  He currently feels well.  He has no chest pain or shortness of breath.  He is able to all of his daily activities.  He has had no further atrial arrhythmias.  He feels much improved in normal rhythm.  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 signficant DDD present with vacuum effect L4-5 and L5-S1 flat discs and anterior spurring noted   Dizziness 05/06/2020   Dyslipidemia 03/18/2015   Dyspnea on exertion 04/02/2020   Ear itch 05/14/2018  Edema 11/16/2015   Edema of both lower extremities due to peripheral venous insufficiency 02/03/2020   First degree AV block 10/20/2020   Heart failure ([place])    History of hiatal hernia    seen on last scan- as slight    History of thrombocytopenia    History of TIAs    Hyperlipidemia    Hypertension    Irritable bowel syndrome 11/17/2016   rec trial of citrucel/ diet 11/16/2016 >>>      Local edema 02/07/2018   Malaise and fatigue  12/08/2020   Malignant neoplasm of left lung ([place]) 12/08/2017   Stage 1A. [place]. And Dr. 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 These slowly enlarging 9 mm right lower lobe pulmonary nodule along the right hemidiaphragm does not appear hypermetabolic today. Location adjacent to the hemidiaphragm can cause false negatives due to motion artifact related to the   Sprain of left ankle 05/21/2021   Stage 3a chronic kidney disease ([place]) 06/17/2020   Stroke ([place])    Swelling of limb-Left foot 01/01/2015   Vitamin B12 deficiency 06/21/2017   Past Surgical History:  Procedure Laterality Date   adenocarcinoma  2019   Removed   CARDIOVERSION N/A 03/22/2021   Procedure: CARDIOVERSION;  Surgeon: Lelon Perla, MD;  Location: [place];  Service: Cardiovascular;  Laterality: N/A;   CARDIOVERSION N/A 12/15/2021   Procedure: CARDIOVERSION;  Surgeon: Janina Mayo, MD;  Location: [place];  Service: Cardiovascular;  Laterality: N/A;   CAROTID ENDARTERECTOMY  2005   Left carotid by Dr. Oneida Alar   CAROTID ENDARTERECTOMY  04/25/11   Right Carotid by Dr. Oneida Alar    COLONOSCOPY  02/23/2017   Colonic polyp status post polypectomy. Pancolonic diverticulosis predominatly in the sigmoid colon. Tubular adenoma   LEAD REVISION/REPAIR N/A 08/16/2021   Procedure: LEAD REVISION/REPAIR;  Surgeon: Constance Haw, MD;  Location: [place] [facility];  Service: Cardiovascular;  Laterality: N/A;   LEFT HEART CATH AND CORONARY ANGIOGRAPHY N/A 02/08/2021   Procedure: LEFT HEART CATH AND CORONARY ANGIOGRAPHY;  Surgeon: Burnell Blanks, MD;  Location: [place] [facility];  Service: Cardiovascular;  Laterality: N/A;   PACEMAKER IMPLANT N/A 07/28/2021   Procedure: PACEMAKER IMPLANT;  Surgeon: Constance Haw, MD;  Location: [place] [facility];  Service: Cardiovascular;  Laterality: N/A;   Septoplasty, nasal w/ submucosal resection  1960   SQUAMOUS CELL CARCINOMA EXCISION     pt said numerous times   TEE WITHOUT CARDIOVERSION N/A 03/22/2021   Procedure: TRANSESOPHAGEAL ECHOCARDIOGRAM (TEE);  Surgeon: Lelon Perla, MD;  Location: [facility];  Service:  Cardiovascular;  Laterality: N/A;   TONSILLECTOMY     VIDEO ASSISTED THORACOSCOPY (VATS)/WEDGE RESECTION Right 11/09/2017   Procedure: RIGHT VIDEO ASSISTED THORACOSCOPY WITH Chales Salmon RESECTION;  Surgeon: [name], MD;  Location: [place];  Service: Thoracic;  Laterality: Right;     Current Outpatient Medications  Medication Sig Dispense Refill   acetaminophen (TYLENOL) 500 MG tablet Take 1,000 mg by mouth every 8 (eight) hours as needed for mild pain or moderate pain (for pain.).     amiodarone (PACERONE) 200 MG tablet Take 1 tablet (200 mg total) by mouth daily. 90 tablet 2   apixaban (ELIQUIS) 5 MG TABS tablet Take 1 tablet (5 mg total) by mouth 2 (two) times daily. 180 tablet 2   Cholecalciferol (VITAMIN D3) 50 MCG (2000 UT) TABS Take 4,000 Units by mouth every other day.     colesevelam (WELCHOL) 625 MG tablet Take 1,250 mg by mouth 2 (two) times daily with a meal.     Cyanocobalamin (VITAMIN  B-12 IJ) Inject 1 each as directed every 30 (thirty) days.     furosemide (LASIX) 20 MG tablet Take 2 tablets (40 mg total) by mouth daily. 90 tablet 2   losartan (COZAAR) 100 MG tablet Take 1 tablet (100 mg total) by mouth daily. 90 tablet 2   Polyethyl Glycol-Propyl Glycol (SYSTANE ULTRA OP) Place 1 drop into both eyes daily. Unknown strength     potassium chloride SA (KLOR-CON M20) 20 MEQ tablet Take 1 tablet (20 mEq total) by mouth daily. 90 tablet 1   rosuvastatin (CRESTOR) 40 MG tablet Take 1 tablet (40 mg total) by mouth at bedtime.     valACYclovir (VALTREX) 1000 MG tablet Take 1,000 mg by mouth 2 (two) times daily as needed (for fever blisters.).      No current facility-administered medications for this visit.    Allergies:   Patient has no known allergies.   Social History:  The patient  reports that he quit smoking about 50 years ago. His smoking use included cigarettes. He has a 16.00 pack-year smoking history. He has never used smokeless tobacco. He reports that he does not currently use alcohol after a past usage of about 7.0 standard drinks of alcohol per week. He reports that he does not use drugs.   Family History:  The patient's family history includes Cancer in his brother and sister; Cancer (age of onset: 102) in his mother; Deep vein thrombosis in his mother; Heart attack in his father and mother; Heart disease in his mother; Hyperlipidemia in his mother; Hypertension in his mother; Other in his mother and sister; Stroke in his mother; Varicose Veins in his mother.   ROS:  Please see the history of present illness.   ROS:  Please see the history of present illness.   Otherwise, review of systems is positive for none.   All other systems are reviewed and negative.   PHYSICAL EXAM: VS:  BP 118/74   Pulse 74   Ht 5\' 8"  (1.727 m)   Wt 172 lb (78 kg)   SpO2 98%   BMI 26.15 kg/m  , BMI Body mass index is 26.15 kg/m. GEN: Well nourished, well developed, in no acute distress   HEENT: normal  Neck: no JVD, carotid bruits, or masses Cardiac: RRR; no murmurs, rubs, or gallops,no edema  Respiratory:  clear to auscultation bilaterally, normal work of breathing GI: soft, nontender, nondistended, + BS MS: no deformity or atrophy  Skin: warm and dry, device site  well healed Neuro:  Strength and sensation are intact Psych: euthymic mood, full affect  EKG:  EKG is ordered today. Personal review of the ekg ordered shows AV paced  Personal review of the device interrogation today. Results in [place]: 07/04/2022: B Natriuretic Peptide 274.5 10/17/2022: ALT 20; Hemoglobin 14.8; Platelet Count 133 11/09/2022: NT-Pro BNP 1,503 11/21/2022: BUN 16; Creatinine, Ser 1.02; Potassium 4.5; Sodium 143    Lipid Panel     Component Value Date/Time   CHOL 159 11/21/2018 0836   TRIG 82 11/21/2018 0836   HDL 59 11/21/2018 0836   CHOLHDL 2.7 11/21/2018 0836   LDLCALC 84 11/21/2018 0836     Wt Readings from Last 3 Encounters:  12/19/22 172 lb (78 kg)  11/09/22 172 lb (78 kg)  10/21/22 171 lb 14.4 oz (78 kg)      Other studies Reviewed: Additional studies/ records that were reviewed today include: TTE 03/19/21  Review of the above records today demonstrates:   1. Left ventricular ejection fraction, by estimation, is 60 to 65%. The  left ventricle has normal function. The left ventricle has no regional  wall motion abnormalities. There is mild left ventricular hypertrophy.  Left ventricular diastolic parameters  are indeterminate.   2. Right ventricular systolic function is normal. The right ventricular  size is normal.   3. The mitral valve is normal in structure. Mild mitral valve  regurgitation. No evidence of mitral stenosis.   4. The aortic valve is calcified. Aortic valve regurgitation is trivial.  Mild to moderate aortic valve sclerosis/calcification is present, without  any evidence of aortic stenosis.   5. The inferior vena cava is normal in size  with greater than 50%  respiratory variability, suggesting right atrial pressure of 3 mmHg.    ASSESSMENT AND PLAN:  1.  Second-degree AV block: Has had episodes of 2-1 AV block intermittently.  Now status post Medtronic dual-chamber pacemaker implanted 07/28/2021.  Had lead revision on 08/16/2021 due to failure to capture with the 3830-lead.  Device function appropriately.  No changes at this time.  Interrogation today is reassuring with no evidence of Afib in about a year. PVC burden remains low <4%. It does not appear that his described sensation /episode nighttime is from an arrhythmia or ectopy standpoint. Reassurance provided. Recommendation of alcohol consumption in moderation. He is going to trial off alcohol for a couple of weeks and determine if this resolves.   Continue amiodarone daily without change.  He had a Cmet with normal LFTs on 10/17/22. We Shamal Stracener obtain TSH level today (most recent level 3.18 on 07/26/22).  2.  Chronic diastolic heart failure: No obvious volume overload.  Continue plan per primary cardiology  3.  Typical atrial flutter: Currently on Eliquis 5 mg twice daily.  CHA2DS2-VASc of at least 5.  Has been loaded on amiodarone.  High risk medication monitoring for amiodarone.  He is remained in sinus rhythm.  We Yajahira Tison continue current management.  Current medicines are reviewed at length with the patient today.   The patient does not have concerns regarding his medicines.  The following changes were made today: None  Labs/ tests ordered today include:  Orders Placed This Encounter  Procedures   TSH   EKG 12-Lead     Disposition:   FU with Tmya Wigington 12 months  Signed, Chandria Rookstool Meredith Leeds, MD  12/19/2022 12:20 PM     [place] [address] [phone] (office) (  [phone] (fax)  I have seen and examined this patient with Emily Filbert.  Agree with above, note added to reflect my findings.  He overall  feels well.  He has no chest pain or shortness of breath.  He presents today as he has continued to hear a whooshing sound when he lays down to sleep.  This occurs mainly at night but does occur during the day when he is at rest and not being distracted.  This has been at times keeping him awake at night.  Over the last 2 nights he has not had any of these issues.  He does state that he has not had any alcohol on either of those nights.  GEN: Well nourished, well developed, in no acute distress  HEENT: normal  Neck: no JVD, carotid bruits, or masses Cardiac: RRR; no murmurs, rubs, or gallops,no edema  Respiratory:  clear to auscultation bilaterally, normal work of breathing GI: soft, nontender, nondistended, + BS MS: no deformity or atrophy  Skin: warm and dry, device site well healed Neuro:  Strength and sensation are intact Psych: euthymic mood, full affect   Second-degree AV block: Status post Medtronic dual-chamber pacemaker.  Device functioning appropriately.  Threshold, sensing, lead impedance within normal limits.  No changes. Chronic diastolic heart failure: No obvious volume overload. Typical atrial flutter: Minimal burden on device interrogation he does have a whooshing sound when he is trying to go to sleep.  He has some PVCs but a low burden at 3 to 4%.  Prosperity Darrough continue to monitor and make no changes at this time.  Aldrick Derrig M. Bion Todorov MD 12/19/2022 12:20 PM

## 2022-12-20 LAB — TSH: TSH: 2.53 u[IU]/mL (ref 0.450–4.500)

## 2022-12-21 ENCOUNTER — Other Ambulatory Visit: Payer: Self-pay

## 2022-12-23 ENCOUNTER — Ambulatory Visit: Payer: Medicare Other | Attending: Cardiology | Admitting: Cardiology

## 2022-12-23 ENCOUNTER — Encounter: Payer: Self-pay | Admitting: Cardiology

## 2022-12-23 VITALS — BP 140/94 | HR 74 | Ht 68.0 in | Wt 169.0 lb

## 2022-12-23 DIAGNOSIS — I2510 Atherosclerotic heart disease of native coronary artery without angina pectoris: Secondary | ICD-10-CM | POA: Diagnosis present

## 2022-12-23 DIAGNOSIS — Z950 Presence of cardiac pacemaker: Secondary | ICD-10-CM | POA: Diagnosis present

## 2022-12-23 DIAGNOSIS — I10 Essential (primary) hypertension: Secondary | ICD-10-CM | POA: Insufficient documentation

## 2022-12-23 DIAGNOSIS — R0989 Other specified symptoms and signs involving the circulatory and respiratory systems: Secondary | ICD-10-CM | POA: Diagnosis present

## 2022-12-23 DIAGNOSIS — I480 Paroxysmal atrial fibrillation: Secondary | ICD-10-CM | POA: Diagnosis present

## 2022-12-23 DIAGNOSIS — E785 Hyperlipidemia, unspecified: Secondary | ICD-10-CM | POA: Insufficient documentation

## 2022-12-23 HISTORY — DX: Paroxysmal atrial fibrillation: I48.0

## 2022-12-23 MED ORDER — SPIRONOLACTONE 25 MG PO TABS: 25.0000 mg | ORAL_TABLET | Freq: Every day | ORAL | 3 refills | Status: DC

## 2022-12-23 MED ORDER — FUROSEMIDE 20 MG PO TABS: 20.0000 mg | ORAL_TABLET | Freq: Every day | ORAL | 3 refills | Status: DC

## 2022-12-23 NOTE — Progress Notes (Signed)
Cardiology Office Note:    Date:  12/23/2022   ID:  Louis Barton, DOB [birth year], MRN [id number]  PCP:  Raina Mina., MD  Cardiologist:  Jenne Campus, MD    Referring MD: Raina Mina., MD   Chief Complaint  Patient presents with   Follow-up  Doing fine  History of Present Illness:    Louis Barton is a 82 y.o. male   with quite complex past medical history.  That include coronary artery disease, mild nonobstructive, cardiac catheterization done in May 2022 showed 20% mid and distal circumflex as well as 20% of mid LAD lesion.  Also history of essential hypertension first-degree AV block, status post bilateral carotic endarterectomy, non-small cell lung cancer status post wedge resection and right lower lobe, remote history of TIA.  Recently he ended up being in [facility] because of episode of atrial flutter with slow ventricular rate.  He did have TEE done and then eventually electrical cardioversion.  Few months ago he came to hospital with confusion and suspicion for CVA/TIA has been raised however future evaluation showed found that he was positive for COVID-19.  Luckily recovered quite nicely.  While he was in the hospital he was bradycardic with 2-1 AV conduction.  He was eventually referred to EP team for consideration of pacemaker implantation.  Eventually end of November of last year he did have pacemaker implantation.  There was a problem with the lead to the activity will need to be revised after that he was noted to have typical atrial flutter and in March of this year he did have cardioversion and seems to maintain sinus rhythm since then.  He comes today to follow-up.  Tragically strike his wife who had severe arctic stenosis had TAVI done however shortly after that she got vascular collapse and she had perforated left ventricle surgically intervene however eventually she ended up passing.   Comes today to my office for follow-up.  Overall he seems to be doing fine he seems  to be optimistic and happy today denies have any chest pain tightness squeezing pressure burning chest.  Vertebrals bothersome the most is the fact that he can hear his heart beating at night when he is lying down in the bed.  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 signficant DDD present with vacuum effect L4-5 and L5-S1 flat discs and anterior spurring noted   Dizziness 05/06/2020   Dyslipidemia 03/18/2015   Dyspnea on exertion 04/02/2020   Ear itch 05/14/2018   Edema 11/16/2015   Edema of both lower  extremities due to peripheral venous insufficiency 02/03/2020   First degree AV block 10/20/2020   Heart failure ([place])    History of hiatal hernia    seen on last scan- as slight    History of thrombocytopenia    History of TIAs    Hyperlipidemia    Hypertension    Irritable bowel syndrome 11/17/2016   rec trial of citrucel/ diet 11/16/2016 >>>      Local edema 02/07/2018   Malaise and fatigue 12/08/2020   Malignant neoplasm of left lung ([place]) 12/08/2017   Stage 1A. [place]. And Dr. 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 These slowly enlarging 9 mm right lower lobe pulmonary nodule along the right hemidiaphragm does not appear hypermetabolic today. Location adjacent to the hemidiaphragm can cause false negatives due to motion artifact related to the   Sprain of left ankle 05/21/2021   Stage 3a chronic kidney disease ([place]) 06/17/2020   Stroke ([place])    Swelling of limb-Left foot 01/01/2015   Vitamin B12 deficiency 06/21/2017    Past Surgical History:  Procedure Laterality Date   adenocarcinoma  2019   Removed   CARDIOVERSION N/A 03/22/2021   Procedure: CARDIOVERSION;  Surgeon: Lelon Perla, MD;  Location: [place];  Service: Cardiovascular;  Laterality: N/A;   CARDIOVERSION N/A 12/15/2021   Procedure: CARDIOVERSION;  Surgeon: Janina Mayo, MD;  Location: [place];  Service: Cardiovascular;  Laterality: N/A;   CAROTID ENDARTERECTOMY  2005   Left carotid by Dr. Oneida Alar   CAROTID ENDARTERECTOMY  04/25/11   Right Carotid by Dr. Oneida Alar   COLONOSCOPY  02/23/2017   Colonic polyp status post polypectomy. Pancolonic diverticulosis predominatly in the sigmoid colon. Tubular adenoma   LEAD  REVISION/REPAIR N/A 08/16/2021   Procedure: LEAD REVISION/REPAIR;  Surgeon: Constance Haw, MD;  Location: [place] [facility];  Service: Cardiovascular;  Laterality: N/A;   LEFT HEART CATH AND CORONARY ANGIOGRAPHY N/A 02/08/2021   Procedure: LEFT HEART CATH AND CORONARY ANGIOGRAPHY;  Surgeon: Burnell Blanks, MD;  Location: [place] [facility];  Service: Cardiovascular;  Laterality: N/A;   PACEMAKER IMPLANT N/A 07/28/2021   Procedure: PACEMAKER IMPLANT;  Surgeon: Constance Haw, MD;  Location: [place] [facility];  Service: Cardiovascular;  Laterality: N/A;   Septoplasty, nasal w/ submucosal resection  1960   SQUAMOUS CELL CARCINOMA EXCISION     pt said numerous times   TEE WITHOUT CARDIOVERSION N/A 03/22/2021   Procedure: TRANSESOPHAGEAL ECHOCARDIOGRAM (TEE);  Surgeon: Lelon Perla, MD;  [facility] ENDOSCOPY;  Service: Cardiovascular;  Laterality: N/A;   TONSILLECTOMY  VIDEO ASSISTED THORACOSCOPY (VATS)/WEDGE RESECTION Right 11/09/2017   Procedure: RIGHT VIDEO ASSISTED THORACOSCOPY WITH Chales [place]mon RESECTION;  Surgeon: [name], MD;  Location: [place];  Service: Thoracic;  Laterality: Right;    Current Medications: Current Meds  Medication Sig   acetaminophen (TYLENOL) 500 MG tablet Take 1,000 mg by mouth every 8 (eight) hours as needed for mild pain or moderate pain (for pain.).   amiodarone (PACERONE) 200 MG tablet Take 1 tablet (200 mg total) by mouth daily.   apixaban (ELIQUIS) 5 MG TABS tablet Take 1 tablet (5 mg total) by mouth 2 (two) times daily.   Cholecalciferol (VITAMIN D3) 50 MCG (2000 UT) TABS Take 4,000 Units by mouth every other day.   colesevelam (WELCHOL) 625 MG tablet Take 1,250 mg by mouth 2 (two) times daily with a meal.   Cyanocobalamin (VITAMIN B-12 IJ) Inject 1 each as directed every 30 (thirty) days.   donepezil (ARICEPT) 5 MG tablet Take 5 mg by mouth at bedtime.   furosemide (LASIX) 20 MG tablet Take 2 tablets (40 mg total) by mouth  daily.   losartan (COZAAR) 100 MG tablet Take 1 tablet (100 mg total) by mouth daily.   Polyethyl Glycol-Propyl Glycol (SYSTANE ULTRA OP) Place 1 drop into both eyes daily. Unknown strength   potassium chloride SA (KLOR-CON M20) 20 MEQ tablet Take 1 tablet (20 mEq total) by mouth daily.   rosuvastatin (CRESTOR) 40 MG tablet Take 1 tablet (40 mg total) by mouth at bedtime.   valACYclovir (VALTREX) 1000 MG tablet Take 1,000 mg by mouth 2 (two) times daily as needed (for fever blisters.).      Allergies:   Patient has no known allergies.   Social History   Socioeconomic History   Marital status: Widowed    Spouse name: Not on file   Number of children: 2   Years of education: Not on file   Highest education level: Not on file  Occupational History   Not on file  Tobacco Use   Smoking status: Former    Packs/day: 1.00    Years: 16.00    Additional pack years: 0.00    Total pack years: 16.00    Types: Cigarettes    Quit date: 09/26/1972    Years since quitting: 50.2   Smokeless tobacco: Never   Tobacco comments:    Former smoker 08/11/21  Vaping Use   Vaping Use: Never used  Substance and Sexual Activity   Alcohol use: Not Currently    Alcohol/week: 7.0 standard drinks of alcohol    Types: 7 Shots of liquor per week    Comment: 1 shot daily 08/11/21   Drug use: No   Sexual activity: Not on file  Other Topics Concern   Not on file  Social History Narrative   Not on file   Social Determinants of Health   Financial Resource Strain: Not on file  Food Insecurity: Not on file  Transportation Needs: Not on file  Physical Activity: Not on file  Stress: Not on file  Social Connections: Not on file     Family History: The patient's family history includes Cancer in his brother and sister; Cancer (age of onset: 71) in his mother; Deep vein thrombosis in his mother; Heart attack in his father and mother; Heart disease in his mother; Hyperlipidemia in his mother; Hypertension in  his mother; Other in his mother and sister; Stroke in his mother; Varicose Veins in his mother. There is no history of Colon  cancer, Esophageal cancer, Rectal cancer, or Stomach cancer. ROS:   Please see the history of present illness.    All 14 point review of systems negative except as described per history of present illness  EKGs/Labs/Other Studies Reviewed:      Recent Labs: 07/04/2022: B Natriuretic Peptide 274.5 10/17/2022: ALT 20; Hemoglobin 14.8; Platelet Count 133 11/09/2022: NT-Pro BNP 1,503 11/21/2022: BUN 16; Creatinine, Ser 1.02; Potassium 4.5; Sodium 143 12/19/2022: TSH 2.530  Recent Lipid Panel    Component Value Date/Time   CHOL 159 11/21/2018 0836   TRIG 82 11/21/2018 0836   HDL 59 11/21/2018 0836   CHOLHDL 2.7 11/21/2018 0836   LDLCALC 84 11/21/2018 0836    Physical Exam:    VS:  BP (!) 140/94 (BP Location: Left Arm, Patient Position: Sitting, Cuff Size: Normal)   Pulse 74   Ht 5\' 8"  (1.727 m)   Wt 169 lb (76.7 kg)   SpO2 95%   BMI 25.70 kg/m     Wt Readings from Last 3 Encounters:  12/23/22 169 lb (76.7 kg)  12/19/22 172 lb (78 kg)  11/09/22 172 lb (78 kg)     GEN:  Well nourished, well developed in no acute distress HEENT: Normal NECK: No JVD; No carotid bruits LYMPHATICS: No lymphadenopathy CARDIAC: RRR, no murmurs, no rubs, no gallops RESPIRATORY:  Clear to auscultation without rales, wheezing or rhonchi  ABDOMEN: Soft, non-tender, non-distended MUSCULOSKELETAL:  No edema; No deformity  SKIN: Warm and dry LOWER EXTREMITIES: no swelling NEUROLOGIC:  Alert and oriented x 3 PSYCHIATRIC:  Normal affect   ASSESSMENT:    1. Coronary artery disease involving native coronary artery of native heart without angina pectoris   2. Primary hypertension   3. Paroxysmal atrial fibrillation (HCC)   4. Dyslipidemia   5. Pacemaker    PLAN:    In order of problems listed above:  Coronary disease stable from that point review and appropriate  medications. Essential hypertension which is still uncontrolled.  I will switch Lasix 40 mg to Lasix 20 mg, I will add 25 mg of Aldactone, will discontinue his potassium, will check his Chem-7 next week hopefully that will get his blood pressure under control. He is here he is hard breathing at night I will schedule him to have carotic ultrasounds make sure he does not have any reactivation of the problem. Dyslipidemia he is taking Crestor 40 which I will continue.  I did review lab work test done by primary care physician in October with LDL of 74.  Will continue present management. Paroxysmal atrial fibrillation, interrogation of the device did not show any recurrences of it continue anticoagulation   Medication Adjustments/Labs and Tests Ordered: Current medicines are reviewed at length with the patient today.  Concerns regarding medicines are outlined above.  No orders of the defined types were placed in this encounter.  Medication changes: No orders of the defined types were placed in this encounter.   Signed, Park Liter, MD, [facility] 12/23/2022 11:46 AM    [place]

## 2022-12-23 NOTE — Patient Instructions (Addendum)
Medication Instructions:   DECREASE: Lasix to 20mg  daily  STOP: Potassium  START: Aldactone 25mg  daily    Lab Work: Your physician recommends that you return for lab work in: next week You can come Monday through Friday 8:30 am to 12:00 pm and 1:15 to 4:30. You do not need to make an appointment as the order has already been placed. The labs you are going to have done are BMET   Testing/Procedures: Your physician has requested that you have a carotid duplex. This test is an ultrasound of the carotid arteries in your neck. It looks at blood flow through these arteries that supply the brain with blood. Allow one hour for this exam. There are no restrictions or special instructions.    Follow-Up: At [facility], you and your health needs are our priority.  As part of our continuing mission to provide you with exceptional heart care, we have created designated Provider Care Teams.  These Care Teams include your primary Cardiologist (physician) and Advanced Practice Providers (APPs -  Physician Assistants and Nurse Practitioners) who all work together to provide you with the care you need, when you need it.  We recommend signing up for the patient portal called "MyChart".  Sign up information is provided on this After Visit Summary.  MyChart is used to connect with patients for Virtual Visits (Telemedicine).  Patients are able to view lab/test results, encounter notes, upcoming appointments, etc.  Non-urgent messages can be sent to your provider as well.   To learn more about what you can do with MyChart, go to [email or web address].    Your next appointment:   6 month(s)  The format for your next appointment:   In Person  Provider:   Jenne Campus, MD    Other Instructions NA

## 2022-12-23 NOTE — Addendum Note (Signed)
Addended by: Jacobo Forest D on: 12/23/2022 12:04 PM   Modules accepted: Orders

## 2022-12-30 LAB — BASIC METABOLIC PANEL
BUN/Creatinine Ratio: 17 (ref 10–24)
BUN: 17 mg/dL (ref 8–27)
CO2: 23 mmol/L (ref 20–29)
Calcium: 9.1 mg/dL (ref 8.6–10.2)
Chloride: 104 mmol/L (ref 96–106)
Creatinine, Ser: 1.03 mg/dL (ref 0.76–1.27)
Glucose: 91 mg/dL (ref 70–99)
Potassium: 4.5 mmol/L (ref 3.5–5.2)
Sodium: 141 mmol/L (ref 134–144)
eGFR: 72 mL/min/{1.73_m2} (ref >59)

## 2023-01-05 ENCOUNTER — Ambulatory Visit: Payer: Medicare Other | Attending: Cardiology

## 2023-01-05 ENCOUNTER — Other Ambulatory Visit: Payer: Self-pay | Admitting: Cardiology

## 2023-01-05 ENCOUNTER — Telehealth: Payer: Self-pay

## 2023-01-05 DIAGNOSIS — I739 Peripheral vascular disease, unspecified: Secondary | ICD-10-CM

## 2023-01-05 DIAGNOSIS — R0989 Other specified symptoms and signs involving the circulatory and respiratory systems: Secondary | ICD-10-CM | POA: Insufficient documentation

## 2023-01-05 NOTE — Telephone Encounter (Signed)
Left message on My Chart with normal results per Dr. Krasowski's note. Routed to PCP. 

## 2023-01-06 ENCOUNTER — Other Ambulatory Visit: Payer: Self-pay | Admitting: Cardiology

## 2023-01-06 DIAGNOSIS — I4819 Other persistent atrial fibrillation: Secondary | ICD-10-CM

## 2023-01-06 DIAGNOSIS — I483 Typical atrial flutter: Secondary | ICD-10-CM

## 2023-01-06 NOTE — Telephone Encounter (Signed)
Eliquis 5mg  refill request received. Patient is 82 years old, weight-76.7kg, Crea-1.03 on 12/29/22, Diagnosis-Afib, and last seen by Dr. Bing Matter on 12/23/22 & Dr. Elberta Fortis on 12/19/22. Dose is appropriate based on dosing criteria. Will send in refill to requested pharmacy.

## 2023-01-09 ENCOUNTER — Telehealth: Payer: Self-pay | Admitting: Cardiology

## 2023-01-09 ENCOUNTER — Other Ambulatory Visit: Payer: Self-pay

## 2023-01-09 NOTE — Telephone Encounter (Signed)
[phone]  Patient says his blood pressure has been under 100 and a little over 100- patient is concerned. Wishes to speak to Dr. Charm Rings nurse but is aware they are not in office today, would like to speak to someone from clinical side.  Please advise

## 2023-01-10 ENCOUNTER — Telehealth: Payer: Self-pay

## 2023-01-10 NOTE — Telephone Encounter (Signed)
Called patient and he reported that he becomes lethargic in the middle of the day. Last week his blood pressure was ranging from 105/67 - 118/62. He states that Dr. Bing Matter had started him on Spirinolactone and he lost 5 pounds, since then he has been cutting that pill in half. He states that he also takes Losartan 100 mg. His blood pressure yesterday was 96/40. Please advise.

## 2023-01-10 NOTE — Telephone Encounter (Signed)
Pt viewed lab results in My Chart per Dr. Vanetta Shawl note. Routed to PCP.

## 2023-01-11 ENCOUNTER — Other Ambulatory Visit: Payer: Self-pay

## 2023-01-11 MED ORDER — LOSARTAN POTASSIUM 50 MG PO TABS: 50.0000 mg | ORAL_TABLET | Freq: Every day | ORAL | 3 refills | Status: DC

## 2023-01-11 NOTE — Telephone Encounter (Signed)
Called patient and informed him of Dr. Vanetta Shawl recommendation below:  "Lets reduce losartan to 50 mg daily and see if that helps"  Patient was agreeable with changing his Losartan medication from 100 to 50 mg. He also states that he is only taking half of his spirinolactone, lasix and potassium daily. He also reported that he has been constantly dizzy. I advised the patient to see if reducing his losartan would help with his dizziness and to let us know if it does not. Will also forward to Dr. Bing Matter for further guidance.

## 2023-01-11 NOTE — Telephone Encounter (Signed)
Spoke with Dr. Bing Matter regarding the patient's dizziness and he stated that we need to wait and see if reducing the dose of Losartan helps with it. I also advised Dr. Bing Matter that the patient was taking half of his Spirinolactone, Lasix and Potassium and he was ok with that.

## 2023-01-12 ENCOUNTER — Telehealth: Payer: Self-pay

## 2023-01-12 ENCOUNTER — Telehealth: Payer: Self-pay | Admitting: Cardiology

## 2023-01-12 NOTE — Telephone Encounter (Signed)
Pt c/o medication issue:  1. Name of Medication:   spironolactone (ALDACTONE) 25 MG tablet   2. How are you currently taking this medication (dosage and times per day)?   As prescribed for about a week and a half but recently the patient was told to take half the dosage  3. Are you having a reaction (difficulty breathing--STAT)?   Dizziness  4. What is your medication issue?   Daughter stated patient has been doing horrible on this new medication.  Daughter stated patient has been feeling dizzy and fell today, although his BP was doing good.  Daughter is concerned the patient had been doing better on his previous medication.  Daughter also wants to know if patient should be taking this medication at night.

## 2023-01-12 NOTE — Telephone Encounter (Signed)
Spoke with pts daughter Valentina Gu- per DPR- she stated that the pt continues to be dizzy on 1/2 of Spironolactone. Advised per Dr. Bing Matter to stop Spironolactone and see if this will help. Also advised to drink plenty to stay well hydrated. Daughter agreed and had no further questions.

## 2023-01-20 ENCOUNTER — Telehealth: Payer: Self-pay

## 2023-01-20 NOTE — Telephone Encounter (Signed)
Patient notified through my chart.

## 2023-01-20 NOTE — Telephone Encounter (Signed)
-----   Message from Georgeanna Lea, MD sent at 01/06/2023 12:04 PM EDT ----- No significant carotid arterial stenosis noted

## 2023-01-23 ENCOUNTER — Ambulatory Visit: Payer: Medicare Other | Attending: Cardiology

## 2023-01-23 DIAGNOSIS — I739 Peripheral vascular disease, unspecified: Secondary | ICD-10-CM | POA: Diagnosis present

## 2023-01-23 LAB — VAS US ABI WITH/WO TBI
Left ABI: 1.14
Right ABI: 1.19

## 2023-01-26 ENCOUNTER — Ambulatory Visit (INDEPENDENT_AMBULATORY_CARE_PROVIDER_SITE_OTHER): Payer: Medicare Other

## 2023-01-26 DIAGNOSIS — I459 Conduction disorder, unspecified: Secondary | ICD-10-CM | POA: Diagnosis not present

## 2023-01-26 LAB — CUP PACEART REMOTE DEVICE CHECK
Battery Remaining Longevity: 110 mo
Battery Voltage: 3 V
Brady Statistic AP VP Percent: 95.16 %
Brady Statistic AP VS Percent: 3.53 %
Brady Statistic AS VP Percent: 0.87 %
Brady Statistic AS VS Percent: 0.43 %
Brady Statistic RA Percent Paced: 98.75 %
Brady Statistic RV Percent Paced: 96.04 %
Date Time Interrogation Session: 20240502033434
Implantable Lead Connection Status: 753985
Implantable Lead Connection Status: 753985
Implantable Lead Implant Date: 20221102
Implantable Lead Implant Date: 20221121
Implantable Lead Location: 753859
Implantable Lead Location: 753860
Implantable Lead Model: 3830
Implantable Lead Model: 5076
Implantable Pulse Generator Implant Date: 20221102
Lead Channel Impedance Value: 285 Ohm
Lead Channel Impedance Value: 342 Ohm
Lead Channel Impedance Value: 418 Ohm
Lead Channel Impedance Value: 456 Ohm
Lead Channel Pacing Threshold Amplitude: 0.875 V
Lead Channel Pacing Threshold Amplitude: 1.25 V
Lead Channel Pacing Threshold Pulse Width: 0.4 ms
Lead Channel Pacing Threshold Pulse Width: 0.4 ms
Lead Channel Sensing Intrinsic Amplitude: 0.625 mV
Lead Channel Sensing Intrinsic Amplitude: 0.625 mV
Lead Channel Sensing Intrinsic Amplitude: 10.625 mV
Lead Channel Sensing Intrinsic Amplitude: 10.625 mV
Lead Channel Setting Pacing Amplitude: 2 V
Lead Channel Setting Pacing Amplitude: 2.5 V
Lead Channel Setting Pacing Pulse Width: 0.4 ms
Lead Channel Setting Sensing Sensitivity: 0.9 mV
Zone Setting Status: 755011
Zone Setting Status: 755011

## 2023-01-30 ENCOUNTER — Telehealth: Payer: Self-pay

## 2023-01-30 NOTE — Telephone Encounter (Signed)
-----   Message from Georgeanna Lea, MD sent at 01/26/2023  8:31 AM EDT ----- ABIs are normal

## 2023-01-30 NOTE — Telephone Encounter (Signed)
Patient notified through my chart.

## 2023-02-07 ENCOUNTER — Other Ambulatory Visit: Payer: Self-pay | Admitting: Cardiology

## 2023-02-15 NOTE — Progress Notes (Signed)
Remote pacemaker transmission.   

## 2023-03-14 ENCOUNTER — Telehealth: Payer: Self-pay | Admitting: Cardiology

## 2023-03-14 DIAGNOSIS — M7989 Other specified soft tissue disorders: Secondary | ICD-10-CM

## 2023-03-14 DIAGNOSIS — I10 Essential (primary) hypertension: Secondary | ICD-10-CM

## 2023-03-14 DIAGNOSIS — R0609 Other forms of dyspnea: Secondary | ICD-10-CM

## 2023-03-14 NOTE — Telephone Encounter (Signed)
Pt c/o swelling: STAT is pt has developed SOB within 24 hours  How much weight have you gained and in what time span? No  If swelling, where is the swelling located? Left ankle is swollen bad and right ankle is start to swell  Are you currently taking a fluid pill? Yes   Are you currently SOB? no  Do you have a log of your daily weights (if so, list)?    Have you gained 3 pounds in a day or 5 pounds in a week? no  Have you traveled recently? yes

## 2023-03-14 NOTE — Telephone Encounter (Signed)
Dr. Bing Matter reviewed pt message and advised pt to come in for labs tomorrow for ProBNP and BMP, Pt reported BP normal, denied shortness of breath. He reported only swelling in his lower legs. Pt agreed to come for labs.

## 2023-03-16 LAB — BASIC METABOLIC PANEL
BUN/Creatinine Ratio: 13 (ref 10–24)
BUN: 14 mg/dL (ref 8–27)
CO2: 25 mmol/L (ref 20–29)
Calcium: 8.8 mg/dL (ref 8.6–10.2)
Chloride: 105 mmol/L (ref 96–106)
Creatinine, Ser: 1.05 mg/dL (ref 0.76–1.27)
Glucose: 81 mg/dL (ref 70–99)
Potassium: 4.2 mmol/L (ref 3.5–5.2)
Sodium: 141 mmol/L (ref 134–144)
eGFR: 70 mL/min/{1.73_m2} (ref >59)

## 2023-03-16 LAB — PRO B NATRIURETIC PEPTIDE: NT-Pro BNP: 817 pg/mL — ABNORMAL HIGH (ref 0–486)

## 2023-03-19 ENCOUNTER — Encounter: Payer: Self-pay | Admitting: Cardiology

## 2023-03-22 ENCOUNTER — Encounter: Payer: Self-pay | Admitting: Cardiology

## 2023-04-02 NOTE — Progress Notes (Unsigned)
Cardiology Office Note:  .   Date:  04/03/2023  ID:  Louis Barton, DOB [birth year], MRN [id number] PCP: Gordan Payment., MD  [facility] Providers Cardiologist:  Gypsy Balsam, MD Electrophysiologist:  Regan Lemming, MD    History of Present Illness: .   Louis Barton is a 82 y.o. male with a past medical history of stroke, PAD, nonobstructive CAD per LHC in 2022, ascending aortic aneurysm, RBBB, first-degree AV block, hypertension, carotid stenosis s/p bilateral carotid endarterectomy, atrial flutter/atrial fibrillation, bradycardia, HFpEF, hyperlipidemia, history of lung cancer in 2019 s/p wedge resection RLL, BPPV.  01/23/2023 vascular ultrasound ABI were normal 01/06/2023 carotid ultrasound right ICA mild stenosis, left ICA no stenosis 04/13/2022 echocardiogram EF 55 to 60%, LA mild to moderately dilated, moderate MR, trivial AR 07/28/2021 PPM implantation 02/2021 DCCV x 1 120J > SB 02/08/2021 left heart cath mild nonobstructive CAD, distal circumflex 20% stenosed, proximal LAD to mid LAD 20% stenosed  Evaluated by Dr. Bing Matter on 12/23/2022, his blood pressure was uncontrolled, his Lasix was decreased to 20 mg daily, his potassium was stopped, he was started on Aldactone 25 mg daily  Evaluated by his PCP on 03/28/2023 for ongoing dizziness.  His losartan had been decreased from 50 mg to 25 mg, and eventually stopped at this most recent visit.  He is already undergone physical therapy to work on his gait however this has not helped either.  A CT of his head was arranged and is yet to be completed.  Lab work was completed including a UA which was negative, CBC hemoglobin 14.6, hematocrit 44.4, platelets 129, cortisol 17.8, TSH 2.683, sodium 139, potassium 4.7, creatinine 1.17, GFR 61, normal LFTs.  He presents today for follow-up of ongoing dizziness.  This has been a persistent issue for him for some time.  He also has several falls that he recounts going back over the last few  years.  This is very bothersome for him, he wants to get to the root of why he continues to fall.  He states he has good days and bad days, yesterday was a good day, states he typically feels well first thing in the morning however as the day progresses he feels worse.  He has significant pedal edema.  He denies chest pain, palpitations, dyspnea, pnd, orthopnea, n, v, dizziness, syncope, weight gain, or early satiety.   ROS: Review of Systems  Constitutional:  Positive for malaise/fatigue.  HENT: Negative.    Eyes: Negative.   Respiratory: Negative.    Cardiovascular:  Positive for leg swelling. Negative for chest pain and palpitations.  Gastrointestinal: Negative.   Genitourinary: Negative.   Musculoskeletal:  Positive for falls.  Skin: Negative.   Neurological:  Positive for dizziness.  Endo/Heme/Allergies:  Bruises/bleeds easily.  Psychiatric/Behavioral: Negative.       Studies Reviewed: [name]   EKG Interpretation Date/Time:  Monday April 03 2023 10:31:52 EDT Ventricular Rate:  65 PR Interval:  212 QRS Duration:  134 QT Interval:  480 QTC Calculation: 499 R Axis:   72  Text Interpretation: AV dual-paced rhythm with prolonged AV conduction Abnormal ECG When compared with ECG of 15-Dec-2021 10:51, Vent. rate has increased BY   2 BPM Confirmed by Wallis Bamberg [phone]) on 04/03/2023 1:04:45 PM    Cardiac Studies & Procedures   CARDIAC CATHETERIZATION  CARDIAC CATHETERIZATION 02/08/2021  Narrative  Mid Cx to Dist Cx lesion is 20% stenosed.  Prox LAD to Mid LAD lesion is 20% stenosed.  1. Mild non-obstructive  CAD 2. Mild elevation LVEDP  Recommendations: Medical management of mild CAD  Findings Coronary Findings Diagnostic  Dominance: Left  Left Anterior Descending Vessel is large. Prox LAD to Mid LAD lesion is 20% stenosed. The lesion is calcified.  Left Circumflex Vessel is large. Mid Cx to Dist Cx lesion is 20% stenosed.  Right Coronary Artery Vessel is moderate in  size.  Intervention  No interventions have been documented.   STRESS TESTS  MYOCARDIAL PERFUSION IMAGING 12/31/2020  Narrative  Nuclear stress EF: 76%.  There was no ST segment deviation noted during stress.  Defect 1: There is a medium defect of moderate severity present in the basal inferior, mid inferior and apical inferior location.  Findings consistent with ischemia.  This is an intermediate risk study.   ECHOCARDIOGRAM  ECHOCARDIOGRAM COMPLETE 04/13/2022  Narrative ECHOCARDIOGRAM REPORT    Patient Name:   Louis Barton Date of Exam: 04/13/2022 Medical Rec #:  [id number]     Height:       69.0 in Accession #:    [id number]    Weight:       171.6 lb Date of Birth:  Jun 12, 1941      BSA:          1.936 m Patient Age:    81 years      BP:           98/62 mmHg Patient Gender: M             HR:           82 bpm. Exam Location:  [facility]  Procedure: 2D Echo, 3D Echo, Cardiac Doppler, Color Doppler and Strain Analysis  Indications:    Chronic heart failure with preserved ejection fraction (HCC) [I50.32 (ICD-10-CM)]; Generalized edema [R60.1 (ICD-10-CM)]  History:        Patient has prior history of Echocardiogram examinations, most recent 03/19/2021. Stroke, Arrythmias:Atrial Fibrillation, Signs/Symptoms:Edema; Risk Factors:Dyslipidemia and Hypertension.  Sonographer:    Margreta Journey RDCS Referring Phys: [id number] ROBERT J KRASOWSKI  IMPRESSIONS   1. Left ventricular ejection fraction, by estimation, is 55 to 60%. The left ventricle has normal function. The left ventricle has no regional wall motion abnormalities. Left ventricular diastolic parameters are indeterminate. 2. Right ventricular systolic function is normal. The right ventricular size is normal. 3. Left atrial size was mild to moderately dilated. 4. The mitral valve is normal in structure. Moderate mitral valve regurgitation. No evidence of mitral stenosis. 5. The aortic valve is normal in structure.  Aortic valve regurgitation is trivial. No aortic stenosis is present. 6. The inferior vena cava is normal in size with greater than 50% respiratory variability, suggesting right atrial pressure of 3 mmHg.  FINDINGS Left Ventricle: Septal hypokinesis possibly related to IVCD. Left ventricular ejection fraction, by estimation, is 55 to 60%. The left ventricle has normal function. The left ventricle has no regional wall motion abnormalities. The left ventricular internal cavity size was normal in size. There is no left ventricular hypertrophy. Left ventricular diastolic parameters are indeterminate.  Right Ventricle: The right ventricular size is normal. No increase in right ventricular wall thickness. Right ventricular systolic function is normal.  Left Atrium: Left atrial size was mild to moderately dilated.  Right Atrium: Right atrial size was normal in size.  Pericardium: There is no evidence of pericardial effusion.  Mitral Valve: The mitral valve is normal in structure. Moderate mitral valve regurgitation. No evidence of mitral valve stenosis.  Tricuspid Valve: The tricuspid valve is normal in  structure. Tricuspid valve regurgitation is not demonstrated. No evidence of tricuspid stenosis.  Aortic Valve: The aortic valve is normal in structure. Aortic valve regurgitation is trivial. No aortic stenosis is present.  Pulmonic Valve: The pulmonic valve was normal in structure. Pulmonic valve regurgitation is not visualized. No evidence of pulmonic stenosis.  Aorta: The aortic root is normal in size and structure.  Venous: The inferior vena cava is normal in size with greater than 50% respiratory variability, suggesting right atrial pressure of 3 mmHg.  IAS/Shunts: No atrial level shunt detected by color flow Doppler.   LEFT VENTRICLE PLAX 2D LVIDd:         4.00 cm   Diastology LVIDs:         3.20 cm   LV e' medial:    6.64 cm/s LV PW:         1.00 cm   LV E/e' medial:  15.4 LV IVS:         0.90 cm   LV e' lateral:   9.90 cm/s LVOT diam:     2.10 cm   LV E/e' lateral: 10.3 LV SV:         47 LV SV Index:   24 LVOT Area:     3.46 cm  3D Volume EF: 3D EF:        56 % LV EDV:       104 ml LV ESV:       46 ml LV SV:        58 ml  RIGHT VENTRICLE             IVC RV Basal diam:  3.50 cm     IVC diam: 1.60 cm RV Mid diam:    2.30 cm RV S prime:     15.90 cm/s TAPSE (M-mode): 2.4 cm  LEFT ATRIUM           Index        RIGHT ATRIUM           Index LA diam:      3.30 cm 1.70 cm/m   RA Area:     18.90 cm LA Vol (A2C): 45.7 ml 23.61 ml/m  RA Volume:   42.20 ml  21.80 ml/m LA Vol (A4C): 58.8 ml 30.37 ml/m AORTIC VALVE LVOT Vmax:   63.30 cm/s LVOT Vmean:  48.000 cm/s LVOT VTI:    0.135 m  AORTA Ao Root diam: 3.90 cm Ao Asc diam:  3.40 cm Ao Desc diam: 2.10 cm  MITRAL VALVE MV Area (PHT): 4.39 cm       SHUNTS MV Decel Time: 173 msec       Systemic VTI:  0.14 m MR Peak grad:    95.6 mmHg    Systemic Diam: 2.10 cm MR Mean grad:    70.0 mmHg MR Vmax:         489.00 cm/s MR Vmean:        403.0 cm/s MR PISA:         1.01 cm MR PISA Eff ROA: 8 mm MR PISA Radius:  0.40 cm MV E velocity: 102.00 cm/s MV A velocity: 37.70 cm/s MV E/A ratio:  2.71  Belva Crome MD Electronically signed by Belva Crome MD Signature Date/Time: 04/13/2022/12:32:55 PM    Final   TEE  ECHO TEE 03/22/2021  Narrative TRANSESOPHOGEAL ECHO REPORT    Patient Name:   Louis Barton Date of Exam: 03/22/2021 Medical Rec #:  [id number]  Height:       68.0 in Accession #:    [id number]    Weight:       181.0 lb Date of Birth:  [birth year]      BSA:          1.959 m Patient Age:    80 years      BP:           193/73 mmHg Patient Gender: M             HR:           97 bpm. Exam Location:  Inpatient  Procedure: Transesophageal Echo, Cardiac Doppler and Color Doppler  Indications:     Atrial flutter I48.92  History:         Patient has prior history of Echocardiogram  examinations, most recent 03/19/2021. CAD, Carotid Disease and PAD, Arrythmias:Bradycardia, First degree AV block and RBBB; Risk Factors:Dyslipidemia and Hypertension. Ascending aortic aneurysm. Chronic kidney disease.  Sonographer:     Leta Jungling RDCS Referring Phys:  [id number] Tacey Ruiz DUNN Diagnosing Phys: Olga Millers MD  PROCEDURE: After discussion of the risks and benefits of a TEE, an informed consent was obtained from the patient. TEE procedure time was 38 minutes. The transesophogeal probe was passed without difficulty through the esophogus of the patient. Imaged were obtained with the patient in a left lateral decubitus position. Local oropharyngeal anesthetic was provided with Cetacaine. Sedation performed by different physician. The patient was monitored while under deep sedation. Anesthestetic sedation was provided intravenously by Anesthesiology: 156.29mg  of Propofol, 40mg  of Lidocaine. Image quality was good. The patient's vital signs; including heart rate, blood pressure, and oxygen saturation; remained stable throughout the procedure. The patient developed no complications during the procedure. A successful direct current cardioversion was performed at 120 joules with 1 attempt.  IMPRESSIONS   1. Left ventricular ejection fraction, by estimation, is 55 to 60%. The left ventricle has normal function. 2. Right ventricular systolic function is normal. The right ventricular size is normal. 3. Left atrial size was mildly dilated. No left atrial/left atrial appendage thrombus was detected. 4. Right atrial size was mildly dilated. 5. The mitral valve is normal in structure. Mild mitral valve regurgitation. 6. The aortic valve is tricuspid. Aortic valve regurgitation is mild. Mild aortic valve sclerosis is present, with no evidence of aortic valve stenosis. 7. There is Moderate (Grade III) plaque involving the descending aorta.  FINDINGS Left Ventricle: Left ventricular ejection  fraction, by estimation, is 55 to 60%. The left ventricle has normal function. The left ventricular internal cavity size was normal in size.  Right Ventricle: The right ventricular size is normal. Right vetricular wall thickness was not assessed. Left Atrium: Left atrial size was mildly dilated. No left atrial/left atrial appendage thrombus was detected.  Right Atrium: Right atrial size was mildly dilated.  Pericardium: Trivial pericardial effusion is present.  Mitral Valve: The mitral valve is normal in structure. Mild mitral valve regurgitation.  Tricuspid Valve: The tricuspid valve is normal in structure. Tricuspid valve regurgitation is mild.  Aortic Valve: The aortic valve is tricuspid. Aortic valve regurgitation is mild. Mild aortic valve sclerosis is present, with no evidence of aortic valve stenosis.  Pulmonic Valve: The pulmonic valve was normal in structure. Pulmonic valve regurgitation is not visualized.  Aorta: The aortic root is normal in size and structure. There is moderate (Grade III) plaque involving the descending aorta.  IAS/Shunts: No atrial level shunt detected by color flow Doppler.  AORTA Ao Root diam: 3.70 cm Ao Asc diam:  3.70 cm  Olga Millers MD Electronically signed by Olga Millers MD Signature Date/Time: 03/22/2021/9:20:31 AM    Final   MONITORS  LONG TERM MONITOR (3-14 DAYS) 11/09/2020  Narrative Patch Wear Time:  6 days and 23 hours (2022-01-25T08:44:38-0500 to 2022-02-01T08:25:23-0500)  Patient had a min HR of 24 bpm, max HR of 144 bpm, and avg HR of 59 bpm. Predominant underlying rhythm was Sinus Rhythm. First Degree AV Block was present. Bundle Branch Block/IVCD was present. 1 run of Ventricular Tachycardia occurred lasting 5 beats with a max rate of 144 bpm (avg 119 bpm). 1 run of Supraventricular Tachycardia occurred lasting 4 beats with a max rate of 104 bpm (avg 102 bpm). Second Degree AV Block-Mobitz I (Wenckebach) was present.  Isolated SVEs were rare (<1.0%), SVE Couplets were rare (<1.0%), and SVE Triplets were rare (<1.0%). Isolated VEs were rare (<1.0%, 4219), VE Couplets were rare (<1.0%, 52), and VE Triplets were rare (<1.0%, 2). Ventricular Bigeminy and Trigeminy were present.  Summary and conclusions: 1 episode of  ventricular tachycardia but only 5 beats, asymptomatic. 1 run of supraventricular tachycardia, only 4 beats, asymptomatic. First-degree AV block noted. Second-degree type I AV block noted during the night however one episode happened at 10:30 in the morning.  Asymptomatic.           Risk Assessment/Calculations:    CHA2DS2-VASc Score = 5   This indicates a 7.2% annual risk of stroke. The patient's score is based upon: CHF History: 1 HTN History: 1 Diabetes History: 0 Stroke History: 0 Vascular Disease History: 1 Age Score: 2 Gender Score: 0            Physical Exam:   VS:  BP 128/88 (BP Location: Left Arm, Patient Position: Sitting, Cuff Size: Normal)   Pulse 64   Ht 5\' 8"  (1.727 m)   Wt 162 lb (73.5 kg)   SpO2 97%   BMI 24.63 kg/m    Wt Readings from Last 3 Encounters:  04/03/23 162 lb (73.5 kg)  12/23/22 169 lb (76.7 kg)  12/19/22 172 lb (78 kg)    GEN: Well nourished, well developed in no acute distress NECK: No JVD; No carotid bruits CARDIAC: RRR, no murmurs, rubs, gallops RESPIRATORY:  Clear to auscultation without rales, wheezing or rhonchi  ABDOMEN: Soft, non-tender, non-distended EXTREMITIES:  +3 pedal edema mid tibia,  No deformity   ASSESSMENT AND PLAN: .   CAD -nonobstructive per left heart cath in 2022, Stable with no anginal symptoms. No indication for ischemic evaluation.  Continue Crestor 40 mg daily. Dizziness -this has been persistent and ongoing for some time without any revealing sources.  He has completed PT with no increase in his dizziness/frequency of falls.  It appears his PCP recommended a rollator, I reiterated this however he is not interested in  any assistive devices at this time.  We discussed decreasing his losartan to 12.5 mg daily however he does not want to do that at this time. Hypertension-blood pressure is well-controlled at 128/88, continue losartan 25 mg daily.  We discussed decreasing this to 12.5 mg daily however he does not want to make the change at this time.  He is currently taking it at nighttime to help offset the dizziness, has noticed no improvement. HFpEF -most recent echo in 2023 55-60%, he was started on spironolactone however cannot tolerate statin was ultimately discontinued.  Cannot tolerate nodal blocking agents secondary to dizziness and episodes of  bradycardia.  Currently on losartan 25 mg daily.  Today, NYHA class I, euvolemic although his legs are very edematous, he also has some venous insufficiency.  Encouraged compression stockings, he typically wears when he is wearing long pants. 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  Would like to see Dr. Elberta Fortis sooner, we will see if he can move his appointment up, wants to discuss if his amiodarone could be contributing to his dizziness. PAF/hypercoagulable state/amiodarone therapy-CHA2DS2-VASc score 5, he is in sinus rhythm today, continue Eliquis 5 mg twice daily--no indication for dose reduction creatinine 1.05, weight 162.  He has had frequent falls and may be something needs to be discussed in the future if the risk is greater than the benefit, for now he would like to continue all of his medications.  TSH 2.683, hemoglobin 14.6, hematocrit 44.4 in June 2024.        Dispo: Follow-up in 3 months with Dr. Bing Matter, we will see if EP can see him sooner than September to discuss his dizziness and amiodarone dosage.  Signed, Flossie Dibble, NP

## 2023-04-03 ENCOUNTER — Ambulatory Visit: Payer: Medicare Other | Attending: Cardiology | Admitting: Cardiology

## 2023-04-03 ENCOUNTER — Encounter: Payer: Self-pay | Admitting: Cardiology

## 2023-04-03 VITALS — BP 128/88 | HR 64 | Ht 68.0 in | Wt 162.0 lb

## 2023-04-03 DIAGNOSIS — I5030 Unspecified diastolic (congestive) heart failure: Secondary | ICD-10-CM

## 2023-04-03 DIAGNOSIS — I10 Essential (primary) hypertension: Secondary | ICD-10-CM

## 2023-04-03 DIAGNOSIS — Z79899 Other long term (current) drug therapy: Secondary | ICD-10-CM | POA: Diagnosis present

## 2023-04-03 DIAGNOSIS — D6859 Other primary thrombophilia: Secondary | ICD-10-CM

## 2023-04-03 DIAGNOSIS — Z950 Presence of cardiac pacemaker: Secondary | ICD-10-CM | POA: Diagnosis present

## 2023-04-03 DIAGNOSIS — I2510 Atherosclerotic heart disease of native coronary artery without angina pectoris: Secondary | ICD-10-CM

## 2023-04-03 DIAGNOSIS — R42 Dizziness and giddiness: Secondary | ICD-10-CM

## 2023-04-03 DIAGNOSIS — I441 Atrioventricular block, second degree: Secondary | ICD-10-CM | POA: Diagnosis present

## 2023-04-03 DIAGNOSIS — I480 Paroxysmal atrial fibrillation: Secondary | ICD-10-CM

## 2023-04-03 NOTE — Patient Instructions (Signed)
Medication Instructions:  Your physician recommends that you continue on your current medications as directed. Please refer to the Current Medication list given to you today.  *If you need a refill on your cardiac medications before your next appointment, please call your pharmacy*   Lab Work: NONE If you have labs (blood work) drawn today and your tests are completely normal, you will receive your results only by: MyChart Message (if you have MyChart) OR A paper copy in the mail If you have any lab test that is abnormal or we need to change your treatment, we will call you to review the results.   Testing/Procedures: NONE   Follow-Up: At [facility], you and your health needs are our priority.  As part of our continuing mission to provide you with exceptional heart care, we have created designated Provider Care Teams.  These Care Teams include your primary Cardiologist (physician) and Advanced Practice Providers (APPs -  Physician Assistants and Nurse Practitioners) who all work together to provide you with the care you need, when you need it.  We recommend signing up for the patient portal called "MyChart".  Sign up information is provided on this After Visit Summary.  MyChart is used to connect with patients for Virtual Visits (Telemedicine).  Patients are able to view lab/test results, encounter notes, upcoming appointments, etc.  Non-urgent messages can be sent to your provider as well.   To learn more about what you can do with MyChart, go to [email or web address].    Your next appointment:   3 month(s)  Provider:   Robert Krasowski, MD    Other Instructions   

## 2023-04-18 ENCOUNTER — Ambulatory Visit: Payer: Medicare Other | Admitting: Podiatry

## 2023-04-18 DIAGNOSIS — M79675 Pain in left toe(s): Secondary | ICD-10-CM | POA: Diagnosis not present

## 2023-04-18 DIAGNOSIS — M79674 Pain in right toe(s): Secondary | ICD-10-CM

## 2023-04-18 DIAGNOSIS — I739 Peripheral vascular disease, unspecified: Secondary | ICD-10-CM

## 2023-04-18 DIAGNOSIS — B351 Tinea unguium: Secondary | ICD-10-CM

## 2023-04-18 DIAGNOSIS — Q828 Other specified congenital malformations of skin: Secondary | ICD-10-CM | POA: Diagnosis not present

## 2023-04-18 NOTE — Progress Notes (Signed)
  Subjective:  Patient ID: Louis Barton, male    DOB: May 13, 1941,  MRN: [id number]  Chief Complaint  Patient presents with   Callouses    Callus trim.    Bunions    Tailors bunion to left foot.     82 y.o. male presents with the above complaint. History confirmed with patient. Patient presenting with pain related to dystrophic thickened elongated nails. Patient is unable to trim own nails related to nail dystrophy and/or mobility issues. Patient does not have a history of T2DM.  Patient does have a history of peripheral arterial disease and had noted abnormal TBI on recent vascular study does have CHF and swelling in both legs as well.  Patient does  have callus present located at the plantar aspect of the fifth metatarsal head on the left foot causing pain. He does take elquis.   Objective:  Physical Exam: warm, good capillary refill nail exam onychomycosis of the toenails, onycholysis, and dystrophic nails protective sensation intact DP and PT pulses are 1+/4 palpable, edema noted to foot/ankle Left Foot:  Pain with palpation of nails due to elongation and dystrophic growth. Hyperkeratotic lesion with central hyperkeratotic core on left plantar 5th met head Right Foot: Pain with palpation of nails due to elongation and dystrophic growth.   Assessment:   1. Porokeratosis   2. Pain due to onychomycosis of toenails of both feet   3. PVD (peripheral vascular disease) (HCC)      Plan:  Patient was evaluated and treated and all questions answered.  #Hyperkeratotic lesions/pre ulcerative calluses present plantar aspect left fifth metatarsal head All symptomatic hyperkeratoses x 1 separate lesions were safely debrided with a sterile #10 blade to patient's level of comfort without incident. We discussed preventative and palliative care of these lesions including supportive and accommodative shoegear, padding, prefabricated and custom molded accommodative orthoses, use of a pumice stone and  lotions/creams daily.  #Onychomycosis with pain  -Nails palliatively debrided as below. -Educated on self-care  Procedure: Nail Debridement Rationale: Pain Type of Debridement: manual, sharp debridement. Instrumentation: Nail nipper, rotary burr. Number of Nails: 10  No follow-ups on file.         Corinna Gab, DPM [facility] / [facility]

## 2023-04-19 ENCOUNTER — Inpatient Hospital Stay: Payer: Medicare Other | Attending: Oncology

## 2023-04-19 DIAGNOSIS — Z08 Encounter for follow-up examination after completed treatment for malignant neoplasm: Secondary | ICD-10-CM | POA: Insufficient documentation

## 2023-04-19 DIAGNOSIS — Z85118 Personal history of other malignant neoplasm of bronchus and lung: Secondary | ICD-10-CM | POA: Diagnosis not present

## 2023-04-19 DIAGNOSIS — C3490 Malignant neoplasm of unspecified part of unspecified bronchus or lung: Secondary | ICD-10-CM

## 2023-04-19 LAB — CMP (CANCER CENTER ONLY)
ALT: 28 U/L (ref 0–44)
AST: 28 U/L (ref 15–41)
Albumin: 3.6 g/dL (ref 3.5–5.0)
Alkaline Phosphatase: 44 U/L (ref 38–126)
Anion gap: 8 (ref 5–15)
BUN: 17 mg/dL (ref 8–23)
CO2: 26 mmol/L (ref 22–32)
Calcium: 8.6 mg/dL — ABNORMAL LOW (ref 8.9–10.3)
Chloride: 104 mmol/L (ref 98–111)
Creatinine: 0.92 mg/dL (ref 0.61–1.24)
GFR, Estimated: >60 mL/min (ref >60)
Glucose, Bld: 71 mg/dL (ref 70–99)
Potassium: 4.1 mmol/L (ref 3.5–5.1)
Sodium: 138 mmol/L (ref 135–145)
Total Bilirubin: 0.9 mg/dL (ref 0.3–1.2)
Total Protein: 6.4 g/dL — ABNORMAL LOW (ref 6.5–8.1)

## 2023-04-19 LAB — CBC WITH DIFFERENTIAL (CANCER CENTER ONLY)
Abs Immature Granulocytes: 0.02 10*3/uL (ref 0.00–0.07)
Basophils Absolute: 0 10*3/uL (ref 0.0–0.1)
Basophils Relative: 0 %
Eosinophils Absolute: 0.1 10*3/uL (ref 0.0–0.5)
Eosinophils Relative: 2 %
HCT: 45.1 % (ref 39.0–52.0)
Hemoglobin: 14.3 g/dL (ref 13.0–17.0)
Immature Granulocytes: 0 %
Lymphocytes Relative: 23 %
Lymphs Abs: 1.4 10*3/uL (ref 0.7–4.0)
MCH: 31 pg (ref 26.0–34.0)
MCHC: 31.7 g/dL (ref 30.0–36.0)
MCV: 97.8 fL (ref 80.0–100.0)
Monocytes Absolute: 0.7 10*3/uL (ref 0.1–1.0)
Monocytes Relative: 11 %
Neutro Abs: 3.9 10*3/uL (ref 1.7–7.7)
Neutrophils Relative %: 64 %
Platelet Count: 148 10*3/uL — ABNORMAL LOW (ref 150–400)
RBC: 4.61 MIL/uL (ref 4.22–5.81)
RDW: 14.1 % (ref 11.5–15.5)
WBC Count: 6.1 10*3/uL (ref 4.0–10.5)
nRBC: 0 % (ref 0.0–0.2)

## 2023-04-20 LAB — CEA: CEA: 11.4 ng/mL — ABNORMAL HIGH (ref 0.0–4.7)

## 2023-04-21 ENCOUNTER — Encounter: Payer: Self-pay | Admitting: Oncology

## 2023-04-21 ENCOUNTER — Other Ambulatory Visit: Payer: Self-pay | Admitting: Oncology

## 2023-04-21 ENCOUNTER — Inpatient Hospital Stay (HOSPITAL_BASED_OUTPATIENT_CLINIC_OR_DEPARTMENT_OTHER): Payer: Medicare Other | Admitting: Oncology

## 2023-04-21 VITALS — BP 113/81 | HR 73 | Resp 16 | Ht 68.0 in | Wt 162.4 lb

## 2023-04-21 DIAGNOSIS — C3490 Malignant neoplasm of unspecified part of unspecified bronchus or lung: Secondary | ICD-10-CM

## 2023-04-21 NOTE — Progress Notes (Signed)
[facility]  [address] 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  We began seeing him in March 2019 for follow-up.  He was found to have an enlarging right lower lobe nodule in January 2019 and had a wedge resection by VATS procedure by Dr. Charlett Lango in February.  Pathology revealed an invasive moderately differentiated adenocarcinoma measuring 1 cm with 14 - nodes.  His CEA was elevated at 9 postoperatively.He is on observation only.  He is also being followed by the urologist for multiple renal cysts.  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  Colonoscopy in May 2018 revealed colon polyps with pathology revealing tubular adenomas.  Upper endoscopy and colonoscopy in 2019 did not reveal any malignancy, so we feel we have ruled out a gastrointestinal source as the cause of the increased CEA.  The CEA increased to 10.5 in November, but then was back down to 8.9 in February.  Repeat CT imaging of the chest, abdomen and pelvis did not reveal any evidence of malignancy.  Previously seen right pleural effusion had decreased, there was a similar appearance of the suspected rounded atelectasis at the right lung base, which was also decreased.  CEA at that time was 9.9.  There was stable bilateral renal cysts.  He had surgery of the left lateral and posterior neck for additional squamous cell carcinomas of the skin.  Repeat CT chest, abdomen and pelvis in October revealed stable rounded presumed atelectasis in the right lower lobe along the resection site with stable scarring, stable volume loss in the right lower lobe and right middle lobe,  as well as stable small adjacent right pleural effusion with adjacent pleural thickening without changes suggestive of active malignancy.  The bilateral renal cysts were stable.   CT imaging from May 2021 revealed stable appearance of right pleural thickening, trace pleural effusion and adjacent density along the wedge resection site favoring rounded atelectasis and scarring in the right lower lobe.  No compelling findings of active malignancy.  CEA was 10.5, previously 9.7, but this tends to fluctuate up and down in this range.  He has some residual effects from COVID-19 that he had in November 2020. CEA from May 2022 was up to 11.0 from 10.3 the year before. CT scans of abdomen and pelvis were negative.  CT chest in September 2021 showed scarring only.  We continued to monitor regularly.   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 Patient states that he feels well and has no complaints of pain. His WBC is 6.1, hemoglobin is 14.3, and he has a slightly low platelet count of 148,000. His CMP is normal other than a low glucose of 71 and calcium of 8.6. His CEA improved from 12.2 to 11.4 as of 04/19/2023. He does note that he feels queasy, unstable, and dizzy at times when his blood sugar may be low, he does not know if he is checking his glucose correctly or not. He informed me that he does feel better after eating ice cream.  I recommended that he check his blood sugar at home and to eat something when he is feeling this way. 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 They are currently going through his medications to see if one of them is causing this. He is on amiodarone and I know that weakness in the legs/unsteadiness can be a side effect from it. 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 He  has an appointment coming up at a cardiology clinic for information about a pacemaker. His last echocardiogram was on 04/14/2023 and his ejection fraction was 60-65 and a septal hypokinesis was found. He inquired about which supplements would be good for him to take and I recommended a mulitvitamin with minerals and/or a B-complex. I will see him back in 6 months with CBC, CMP, CEA, and repeat CT chest a few days before his appointment with me.   He denies signs of infection such as sore throat, sinus drainage, cough, or urinary symptoms.  He denies fevers or recurrent chills. He denies pain. He denies nausea, vomiting, chest pain, dyspnea or cough. His appetite is good and her weight has decreased 7 pounds over last 4 months . He is accompanied with his daughter at today's appointment.    REVIEW OF SYSTEMS:  Review of Systems  Constitutional: Negative.  Negative for appetite change, chills, diaphoresis, fatigue, fever and unexpected weight change.  HENT:  Negative.  Negative for hearing loss, lump/mass, mouth sores, nosebleeds, sore throat, tinnitus, trouble swallowing and voice change.   Eyes: Negative.  Negative for eye problems and icterus.  Respiratory: Negative.  Negative for chest tightness, cough, hemoptysis, shortness of breath and wheezing.   Cardiovascular: Negative.  Negative for chest pain, leg swelling and palpitations.  Gastrointestinal: Negative.  Negative for abdominal distention, abdominal pain, blood in stool, constipation, diarrhea, nausea, rectal pain and vomiting.  Endocrine: Negative.  Negative for hot flashes.  Genitourinary: Negative.  Negative for bladder incontinence, difficulty urinating, dyspareunia, dysuria, frequency, hematuria, nocturia, pelvic pain and penile discharge.   Musculoskeletal:  Negative for arthralgias, back pain, flank pain, gait problem, myalgias, neck pain and neck stiffness.  Skin: Negative.  Negative for itching, rash and wound.  Neurological:  Negative.  Negative for dizziness, extremity weakness, gait problem, headaches, light-headedness, numbness, seizures and speech difficulty.  Hematological: Negative.  Negative for adenopathy. Does not bruise/bleed easily (occasionally).  Psychiatric/Behavioral:  Negative for confusion, decreased concentration, depression, sleep disturbance and suicidal ideas. The patient is nervous/anxious.        Memory issues   VITALS:  Blood pressure (!) 144/92, pulse 83, temperature 97.9 F (36.6 C), temperature source Oral, resp. rate 19, height 5\' 8"  (1.727 m), weight 171 lb 14.4 oz (78 kg), SpO2 98 %.     Wt Readings from Last 3 Encounters:  10/21/22 171 lb 14.4 oz (78 kg)  10/17/22 171 lb 1.6 oz (77.6 kg)  08/23/22 170 lb 9.6 oz (77.4 kg)    Body mass index is 26.14 kg/m.   Performance status (ECOG): 1 - Symptomatic but completely ambulatory   PHYSICAL EXAM:  Physical Exam Vitals and nursing note reviewed. Exam conducted with a chaperone present.  Constitutional:      General: He is not in acute distress.    Appearance: Normal appearance. He is normal weight. He is not ill-appearing, toxic-appearing or diaphoretic.  HENT:  Head: Normocephalic and atraumatic.     Right Ear: Tympanic membrane, ear canal and external ear normal. There is no impacted cerumen.     Left Ear: Tympanic membrane, ear canal and external ear normal. There is no impacted cerumen.     Nose: Nose normal. No congestion or rhinorrhea.     Mouth/Throat:     Mouth: Mucous membranes are moist.     Pharynx: Oropharynx is clear. No oropharyngeal exudate or posterior oropharyngeal erythema.  Eyes:     General: No scleral icterus.       Right eye: No discharge.        Left eye: No discharge.     Extraocular Movements: Extraocular movements intact.     Conjunctiva/sclera: Conjunctivae normal.     Pupils: Pupils are equal, round, and reactive to light.  Neck:     Vascular: No carotid bruit.  Cardiovascular:     Rate and  Rhythm: Normal rate and regular rhythm.     Pulses: Normal pulses.     Heart sounds: Normal heart sounds. No murmur heard.    No friction rub. No gallop.  Pulmonary:     Effort: Pulmonary effort is normal. No respiratory distress.     Breath sounds: No stridor. Examination of the right-lower field reveals decreased breath sounds. Decreased breath sounds present. No wheezing, rhonchi or rales.  Chest:     Chest wall: No tenderness.  Abdominal:     General: Bowel sounds are normal. There is no distension.     Palpations: Abdomen is soft. There is no hepatomegaly, splenomegaly or mass.     Tenderness: There is no abdominal tenderness. There is no right CVA tenderness, left CVA tenderness, guarding or rebound.     Hernia: No hernia is present.  Musculoskeletal:        General: No swelling, tenderness, deformity or signs of injury. Normal range of motion.     Cervical back: Normal range of motion and neck supple. No rigidity or tenderness.     Right lower leg: Edema present.     Left lower leg: Edema present.     Comments: 1-2+ edema L>R  Lymphadenopathy:     Cervical: No cervical adenopathy.     Upper Body:     Right upper body: No supraclavicular or axillary adenopathy.     Left upper body: No supraclavicular or axillary adenopathy.     Lower Body: No right inguinal adenopathy. No left inguinal adenopathy.  Skin:    General: Skin is warm and dry.     Coloration: Skin is not jaundiced or pale.     Findings: No bruising, erythema, lesion or rash.  Neurological:     General: No focal deficit present.     Mental Status: He is alert and oriented to person, place, and time. Mental status is at baseline.     Cranial Nerves: No cranial nerve deficit.     Sensory: No sensory deficit.     Motor: No weakness.     Coordination: Coordination normal.     Gait: Gait normal.     Deep Tendon Reflexes: Reflexes normal.  Psychiatric:        Mood and Affect: Mood normal.        Behavior: Behavior  normal.        Thought Content: Thought content normal.        Judgment: Judgment normal.    LABS:    Component Ref Range & Units 2 d ago (04/19/23)  6 mo ago (10/17/22)  WBC Count 4.0 - 10.5 K/uL 6.1 5.9  RBC 4.22 - 5.81 MIL/uL 4.61 4.77  Hemoglobin 13.0 - 17.0 g/dL 16.1 09.6  HCT 04.5 - 40.9 % 45.1 46.7  MCV 80.0 - 100.0 fL 97.8 97.9  MCH 26.0 - 34.0 pg 31.0 31.0  MCHC 30.0 - 36.0 g/dL 81.1 91.4  RDW 78.2 - 95.6 % 14.1 14.3  Platelet Count 150 - 400 K/uL 148 Low  133 Low     Component Ref Range & Units 2 d ago (04/19/23) 1 mo ago (03/15/23) 3 mo ago (12/29/22) 5 mo ago (11/21/22) 5 mo ago (11/09/22) 6 mo ago (10/17/22)  Sodium 135 - 145 mmol/L 138 141 R 141 R 143 R 140 R 140  Potassium 3.5 - 5.1 mmol/L 4.1 4.2 R 4.5 R 4.5 R 4.2 R 4.3  Chloride 98 - 111 mmol/L 104 105 R 104 R 104 R 102 R 104  CO2 22 - 32 mmol/L 26 25 R 23 R 26 R 24 R 26  Glucose, Bld 70 - 99 mg/dL 71 81 91 81 213 High  93 CM  BUN 8 - 23 mg/dL 17 14 R 17 R 16 R 16 R 16  Creatinine 0.61 - 1.24 mg/dL 0.86 5.78 R 4.69 R 6.29 R 1.08 R 1.06  Calcium 8.9 - 10.3 mg/dL 8.6 Low  8.8 R 9.1 R 8.7 R 8.9 R 8.5 Low   Total Protein 6.5 - 8.1 g/dL 6.4 Low      6.4 Low   Albumin 3.5 - 5.0 g/dL 3.6     3.4 Low   AST 15 - 41 U/L 28     27  ALT 0 - 44 U/L 28     20  Alkaline Phosphatase 38 - 126 U/L 44        Component Ref Range & Units 2 d ago (04/19/23) 6 mo ago (10/17/22) 9 mo ago (07/04/22)  CEA 0.0 - 4.7 ng/mL 11.4 High  12.2 High  CM 10.4 High  CM   Component Ref Range & Units 3 wk ago  T4, Free 0.6 - 1.3 ng/dL 1.1   Component Ref Range & Units 3 wk ago  TSH 0.450 - 5.330 uIU/mL 2.683     STUDIES:  EXAM: 10/20/2022 CT CHEST, ABDOMEN, AND PELVIS WITH CONTRAST IMPRESSION: 1. No evidence of metastatic disease in the chest, abdomen or pelvis. 2. Stable postsurgical changes of the right lung with adjacent pleural thickening and nodular opacity, favored to represent rounded atelectasis. 3.  Mild fatty liver infiltration. 4. Scattered colonic diverticula without evidence of acute diverticulitis. 5. 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 The IMA is patent proximally. Please correlate for any symptoms of mesenteric ischemia. 6. Small hernia along the anterior right hemipelvis containing the appendix. 7. Aortic atherosclerosis.   Aortic Atherosclerosis (ICD10-I70.0).      HISTORY:   No Known Allergies     Current Medications:       Current Outpatient Medications  Medication Sig Dispense Refill   acetaminophen (TYLENOL) 500 MG tablet Take 1,000 mg by mouth every 8 (eight) hours as needed for mild pain or moderate pain (for pain.).       amiodarone (PACERONE) 200 MG tablet Take 200 mg by mouth daily.       apixaban (ELIQUIS) 5 MG TABS tablet Take 1 tablet (5 mg total) by mouth 2 (two) times daily.  180 tablet 2   Cholecalciferol (VITAMIN D3) 50 MCG (2000 UT) TABS Take 4,000 Units by mouth every other day.       colesevelam (WELCHOL) 625 MG tablet Take 1,250 mg by mouth 2 (two) times daily with a meal.       Cyanocobalamin (VITAMIN B-12 IJ) Inject 1 each as directed every 30 (thirty) days.       furosemide (LASIX) 20 MG tablet Take 1 tablet (20 mg total) by mouth daily. 90 tablet 2   losartan (COZAAR) 50 MG tablet Take 1 tablet (50 mg total) by mouth daily. 90 tablet 3   Polyethyl Glycol-Propyl Glycol (SYSTANE ULTRA OP) Place 1 drop into both eyes daily. Unknown strength       potassium chloride SA (KLOR-CON M20) 20 MEQ tablet Take 1 tablet (20 mEq total) by mouth daily. 90 tablet 2   rosuvastatin (CRESTOR) 40 MG tablet Take 1 tablet (40 mg total) by mouth at bedtime.       valACYclovir (VALTREX) 1000 MG tablet Take 1,000 mg by mouth 2 (two) times daily as needed (for fever blisters.).           No current facility-administered medications for this visit.          ASSESSMENT & PLAN:     Assessment:    1. 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  He remains without evidence of disease for nearly 5 years. His CT from January, 2024 reveals stable post surgical changes of the right lung with adjacent pleural thickening and nodular opacity. He has no evidence of metastatic disease in the chest, abdomen, or pelvis.    2.  Elevation of the CEA of uncertain etiology, which tends to fluctuate up and down, but is persistent and was up to 12.2 in January and is now down to 11.4 in July.    3. Multiple renal cysts being monitored by Dr. Katrinka Blazing, his urologist with [facility].   4. Multiple skin cancers. He had a squamous cell carcinoma resected from the upper right anterior chest.    5. Multiple colon polyps.  His last colonoscopy was in October 2019 with Dr. Chales Abrahams, which revealed two polyps consistent with tubular adenomas.  I therefore recommend that he undergo at least 1 more colonoscopy even though he is in his 42's, as he is a fairly healthy individual, especially with the persistently elevated CEA.     6.  Minimal thrombocytopenia.  7. Possible hypoglycemia.  8. Ataxic gait and instability when walking. I don't know whether this is partially neuropathy or cerebellar degeneration. I wonder if the Amiodarone is aggravating this situation. He will discuss this with his cardiologist.      Plan: His WBC is 6.1, hemoglobin is 14.3, and he has a slightly low platelet count of 148,000. His CMP is normal other than a low glucose of 71 and calcium of 8.6. His CEA improved from 12.2 to 11.4 as of 04/19/2023. He does note that he feels queasy, unstable, and dizzy at times when his blood sugar may be low, he does not know if he is checking his glucose correctly or not. He informed me that he does feel better after eating ice cream. I recommended that he check his blood sugar at home and to eat something when he is feeling this way. His daughter  informed me that he is on a exelon patch for his memory issues and that occasionally his leg would give  out. They are currently going through his medications to see if one of them is causing this. He is on amiodarone and I know that weakness in the legs/unsteadiness and be a side effect from it. 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 He has an appointment coming up at a cardiology clinic for information about a pacemaker. His last echocardiogram was on 04/14/2023 and his ejection fraction was 60-65% but septal hypokinesis was found. He inquired about which supplements would be good for him to take and I recommended a mulitvitamin with minerals and/or a B-complex. I will see him back in 6 months with CBC, CMP, CEA, and repeat CT chest a few days before his appointment with me. The patient understands the plans discussed today and is in agreement with them.  The patient knows to contact our office if his develops concerns prior to his next appointment.     I provided 35 minutes of face-to-face time during this this encounter and > 50% was spent counseling as documented under my assessment and plan.     I,Jasmine M Lassiter,acting as a scribe for Dellia Beckwith, MD.,have documented all relevant documentation on the behalf of Dellia Beckwith, MD,as directed by  Dellia Beckwith, MD while in the presence of Dellia Beckwith, MD.

## 2023-04-24 ENCOUNTER — Encounter: Payer: Self-pay | Admitting: Cardiology

## 2023-04-25 MED ORDER — AMIODARONE HCL 200 MG PO TABS: 100.0000 mg | ORAL_TABLET | Freq: Every day | ORAL | 1 refills | Status: DC

## 2023-04-27 ENCOUNTER — Ambulatory Visit (INDEPENDENT_AMBULATORY_CARE_PROVIDER_SITE_OTHER): Payer: Medicare Other

## 2023-04-27 DIAGNOSIS — I441 Atrioventricular block, second degree: Secondary | ICD-10-CM | POA: Diagnosis not present

## 2023-05-04 ENCOUNTER — Encounter: Payer: Self-pay | Admitting: Cardiology

## 2023-05-08 NOTE — Progress Notes (Unsigned)
Cardiology Office Note Date:  05/08/2023  Patient ID:  Louis Barton, Louis Barton 04-Jul-1941, MRN [id number] PCP:  Gordan Payment., MD  Cardiologist:  Dr. Bing Matter Electrophysiologist: Dr. Elberta Fortis   ***refresh   Chief Complaint: *** pt would like to discuss amiodarone  History of Present Illness: Louis Barton is a 82 y.o. 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 Elberta Fortis 12/19/22, sent in an unscheduled transmission with a sense of hearig heart beat "wooshing" at night.  Not particularly new, perhaps more often.  Otherwise feeling well. No AFib in about a year, devic functioning well, advised reductionin ETOH (drinkin 2 scotch drinks a night) No med changes Planned for annual EP/device visit  More recently saw J. Woody, NP with c/o dizziness, his PMD had been evaluating, adjusted BP meds, was pending CT. Dizziness was not new, doing on for some time and reported several falls PT did not help him, was advised by his PMD and her to use walking assistive devices but he did not want to.  Discussed further reduction inhis losartan but he didn't want to do that either. He was wondering if it could be the amiodarone, Discussed if falls continued would need to revisit his [facility]   Dr. Bing Matter discussed dizziness back in 2019  *** dizzy?, ? pre-dates amio? *** loer dose, stop *** neurology? *** bleeding, eliquis, dose, labs *** falls *** using cane etc? 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 signficant DDD present with vacuum effect L4-5 and L5-S1 flat discs and anterior spurring noted   Dizziness 05/06/2020   Dyslipidemia 03/18/2015   Dyspnea on exertion 04/02/2020   Ear itch 05/14/2018   Edema 11/16/2015   Edema of both lower extremities due to peripheral venous insufficiency 02/03/2020   First degree AV block 10/20/2020   Heart failure (HCC)    History of hiatal hernia    seen on last scan- as slight    History of thrombocytopenia    History of TIAs    Hyperlipidemia    Hypertension    Irritable bowel syndrome 11/17/2016   rec trial of citrucel/ diet 11/16/2016 >>>      Local edema 02/07/2018   Malaise and fatigue 12/08/2020   Malignant neoplasm  of left lung (HCC) 12/08/2017   Stage 1A. [place]. And Dr. 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 Renal parapelvic cysts 10/06/16 on CT , right kidney midpole 1.2 cm, right kidney anterior pole 1.1 cm, and to the left kidney lower pole anterior position 3.5cm  all felt to be benign   Right bundle branch block 10/20/2020   Right lower lobe pulmonary nodule 11/09/2017   Shingles    internal- obstruction of bowels & gallbladder   Solitary pulmonary nodule 11/16/2016   CT ABd 03/24/09 linerar densities in RLL and RML c/w scarring o/w clear CT Abd 10/20/16 c/w 7 mm nodule  - CT chest 02/13/2017 :  slt enlarged to 9 mm   - PET 04/04/17 :1. These slowly enlarging 9 mm right lower lobe pulmonary nodule along the right hemidiaphragm does not appear hypermetabolic today. Location adjacent to the hemidiaphragm can cause false negatives due to motion artifact related to the   Sprain of left ankle 05/21/2021   Stage 3a chronic kidney disease (HCC) 06/17/2020   Stroke (HCC)    Swelling of limb-Left foot 01/01/2015   Vitamin B12 deficiency 06/21/2017    Past Surgical History:  Procedure Laterality Date   adenocarcinoma  2019   Removed   CARDIOVERSION N/A 03/22/2021   Procedure: CARDIOVERSION;  Surgeon: Lewayne Bunting, MD;  Location: [facility] ENDOSCOPY;  Service: Cardiovascular;  Laterality: N/A;   CARDIOVERSION N/A 12/15/2021   Procedure: CARDIOVERSION;  Surgeon: Maisie Fus, MD;  Location: [facility] ENDOSCOPY;  Service: Cardiovascular;  Laterality: N/A;   CAROTID ENDARTERECTOMY  2005   Left carotid by Dr. Darrick Penna   CAROTID ENDARTERECTOMY  04/25/11   Right Carotid by Dr. Darrick Penna   COLONOSCOPY  02/23/2017   Colonic polyp status post polypectomy. Pancolonic diverticulosis predominatly in the sigmoid colon. Tubular adenoma   LEAD REVISION/REPAIR N/A 08/16/2021   Procedure: LEAD REVISION/REPAIR;  Surgeon: Regan Lemming, MD;  Location: [facility] INVASIVE CV LAB;  Service: Cardiovascular;  Laterality: N/A;   LEFT HEART CATH AND CORONARY ANGIOGRAPHY N/A 02/08/2021   Procedure: LEFT HEART CATH AND CORONARY ANGIOGRAPHY;  Surgeon: Kathleene Hazel, MD;  Location: [facility] INVASIVE CV LAB;  Service: Cardiovascular;  Laterality: N/A;   PACEMAKER IMPLANT N/A 07/28/2021   Procedure:  PACEMAKER IMPLANT;  Surgeon: Regan Lemming, MD;  Location: [facility] INVASIVE CV LAB;  Service: Cardiovascular;  Laterality: N/A;   Septoplasty, nasal w/ submucosal resection  1960   SQUAMOUS CELL CARCINOMA EXCISION     pt said numerous times   TEE WITHOUT CARDIOVERSION N/A 03/22/2021   Procedure: TRANSESOPHAGEAL ECHOCARDIOGRAM (TEE);  Surgeon: Lewayne Bunting, MD;  Location: [facility] ENDOSCOPY;  Service: Cardiovascular;  Laterality: N/A;   TONSILLECTOMY     VIDEO ASSISTED THORACOSCOPY (VATS)/WEDGE RESECTION Right 11/09/2017   Procedure: RIGHT VIDEO ASSISTED THORACOSCOPY WITH Sabino Snipes RESECTION;  Surgeon: Loreli Slot, MD;  Location: [facility] OR;  Service: Thoracic;  Laterality: Right;    Current Outpatient Medications  Medication Sig Dispense Refill   acetaminophen (TYLENOL) 500 MG tablet Take 1,000 mg by mouth every 8 (eight) hours as needed for mild pain or moderate pain (for pain.).     amiodarone (PACERONE) 200 MG tablet Take 0.5 tablets (100 mg total)  by mouth daily. 45 tablet 1   Cholecalciferol (VITAMIN D3) 50 MCG (2000 UT) TABS Take 4,000 Units by mouth every other day.     colesevelam (WELCHOL) 625 MG tablet Take 1,250 mg by mouth 2 (two) times daily with a meal.     Cyanocobalamin (VITAMIN B-12 IJ) Inject 1 each as directed every 30 (thirty) days.     ELIQUIS 5 MG TABS tablet TAKE 1 TABLET BY MOUTH TWICE A DAY 180 tablet 2   furosemide (LASIX) 20 MG tablet Take 1 tablet (20 mg total) by mouth daily. 90 tablet 2   Polyethyl Glycol-Propyl Glycol (SYSTANE ULTRA OP) Place 1 drop into both eyes daily. Unknown strength     rivastigmine (EXELON) 4.6 mg/24hr 4.6 mg daily.     rosuvastatin (CRESTOR) 40 MG tablet Take 1 tablet (40 mg total) by mouth at bedtime.     No current facility-administered medications for this visit.    Allergies:   Patient has no known allergies.   Social History:  The patient  reports that he quit smoking about 50 years ago. His smoking use included cigarettes. He  started smoking about 66 years ago. He has a 16 pack-year smoking history. He has never used smokeless tobacco. He reports that he does not currently use alcohol after a past usage of about 7.0 standard drinks of alcohol per week. He reports that he does not use drugs.   Family History:  The patient's family history includes Cancer in his brother and sister; Cancer (age of onset: 72) in his mother; Deep vein thrombosis in his mother; Heart attack in his father and mother; Heart disease in his mother; Hyperlipidemia in his mother; Hypertension in his mother; Other in his mother and sister; Stroke in his mother; Varicose Veins in his mother.  ROS:  Please see the history of present illness.    All other systems are reviewed and otherwise negative.   PHYSICAL EXAM:  VS:  There were no vitals taken for this visit. BMI: There is no height or weight on file to calculate BMI. Well nourished, well developed, in no acute distress HEENT: normocephalic, atraumatic Neck: no JVD, carotid bruits or masses Cardiac:  *** RRR; no significant murmurs, no rubs, or gallops Lungs:  *** CTA b/l, no wheezing, rhonchi or rales Abd: soft, nontender MS: no deformity or *** atrophy Ext: *** no edema Skin: warm and dry, no rash Neuro:  No gross deficits appreciated Psych: euthymic mood, full affect  *** PPM site is stable, no tethering or discomfort   EKG:  Done today and reviewed by myself shows      Device interrogation done today and reviewed by myself:  ***   04/13/22: TTE 1. Left ventricular ejection fraction, by estimation, is 55 to 60%. The  left ventricle has normal function. The left ventricle has no regional  wall motion abnormalities. Left ventricular diastolic parameters are  indeterminate.   2. Right ventricular systolic function is normal. The right ventricular  size is normal.   3. Left atrial size was mild to moderately dilated.   4. The mitral valve is normal in structure. Moderate mitral  valve  regurgitation. No evidence of mitral stenosis.   5. The aortic valve is normal in structure. Aortic valve regurgitation is  trivial. No aortic stenosis is present.   6. The inferior vena cava is normal in size with greater than 50%  respiratory variability, suggesting right atrial pressure of 3 mmHg.     Echocardiogram 03/19/21:  1. Left ventricular ejection fraction, by estimation, is 60 to 65%. The  left ventricle has normal function. The left ventricle has no regional  wall motion abnormalities. There is mild left ventricular hypertrophy.  Left ventricular diastolic parameters  are indeterminate.   2. Right ventricular systolic function is normal. The right ventricular  size is normal.   3. The mitral valve is normal in structure. Mild mitral valve  regurgitation. No evidence of mitral stenosis.   4. The aortic valve is calcified. Aortic valve regurgitation is trivial.  Mild to moderate aortic valve sclerosis/calcification is present, without  any evidence of aortic stenosis.   5. The inferior vena cava is normal in size with greater than 50%  respiratory variability, suggesting right atrial pressure of 3 mmHg.  Cath 01/2021 showed only 20% Cx, 20% LAD, mildly elevated LVEDP   Recent Labs: 07/04/2022: B Natriuretic Peptide 274.5 12/19/2022: TSH 2.530 03/15/2023: NT-Pro BNP 817 04/19/2023: ALT 28; BUN 17; Creatinine 0.92; Hemoglobin 14.3; Platelet Count 148; Potassium 4.1; Sodium 138  No results found for requested labs within last 365 days.   CrCl cannot be calculated (Unknown ideal weight.).   Wt Readings from Last 3 Encounters:  04/21/23 162 lb 6.4 oz (73.7 kg)  04/03/23 162 lb (73.5 kg)  12/23/22 169 lb (76.7 kg)     Other studies reviewed: Additional studies/records reviewed today include: summarized above  ASSESSMENT AND PLAN:  PPM ***  AFib/Flutter CHA2DS2Vasc is 5, on Eliquis, *** appropriately dosed *** % burden   Secondary hypercoagulable  state    Disposition: F/u with ***  Current medicines are reviewed at length with the patient today.  The patient did not have any concerns regarding medicines.  Norma Fredrickson, PA-C 05/08/2023 8:21 AM     [facility] [address] [place] [address] [phone] (office)  [phone] (fax)

## 2023-05-10 ENCOUNTER — Ambulatory Visit: Payer: Medicare Other | Attending: Physician Assistant | Admitting: Physician Assistant

## 2023-05-10 ENCOUNTER — Encounter: Payer: Self-pay | Admitting: Physician Assistant

## 2023-05-10 VITALS — BP 100/64 | HR 61 | Ht 68.0 in | Wt 163.0 lb

## 2023-05-10 DIAGNOSIS — R2681 Unsteadiness on feet: Secondary | ICD-10-CM | POA: Insufficient documentation

## 2023-05-10 DIAGNOSIS — R42 Dizziness and giddiness: Secondary | ICD-10-CM | POA: Diagnosis present

## 2023-05-10 DIAGNOSIS — Z950 Presence of cardiac pacemaker: Secondary | ICD-10-CM | POA: Diagnosis not present

## 2023-05-10 DIAGNOSIS — I4892 Unspecified atrial flutter: Secondary | ICD-10-CM | POA: Diagnosis present

## 2023-05-10 DIAGNOSIS — W19XXXD Unspecified fall, subsequent encounter: Secondary | ICD-10-CM | POA: Insufficient documentation

## 2023-05-10 DIAGNOSIS — D6869 Other thrombophilia: Secondary | ICD-10-CM | POA: Diagnosis present

## 2023-05-10 DIAGNOSIS — I441 Atrioventricular block, second degree: Secondary | ICD-10-CM | POA: Insufficient documentation

## 2023-05-10 DIAGNOSIS — R296 Repeated falls: Secondary | ICD-10-CM

## 2023-05-10 DIAGNOSIS — I480 Paroxysmal atrial fibrillation: Secondary | ICD-10-CM | POA: Insufficient documentation

## 2023-05-10 LAB — CUP PACEART INCLINIC DEVICE CHECK
Battery Remaining Longevity: 107 mo
Battery Voltage: 3 V
Brady Statistic AP VP Percent: 96.18 %
Brady Statistic AP VS Percent: 3.17 %
Brady Statistic AS VP Percent: 0.44 %
Brady Statistic AS VS Percent: 0.21 %
Brady Statistic RA Percent Paced: 99.39 %
Brady Statistic RV Percent Paced: 96.62 %
Date Time Interrogation Session: 20240814192504
Implantable Lead Connection Status: 753985
Implantable Lead Connection Status: 753985
Implantable Lead Implant Date: 20221102
Implantable Lead Implant Date: 20221121
Implantable Lead Location: 753859
Implantable Lead Location: 753860
Implantable Lead Model: 3830
Implantable Lead Model: 5076
Implantable Pulse Generator Implant Date: 20221102
Lead Channel Impedance Value: 285 Ohm
Lead Channel Impedance Value: 342 Ohm
Lead Channel Impedance Value: 437 Ohm
Lead Channel Impedance Value: 475 Ohm
Lead Channel Pacing Threshold Amplitude: 0.875 V
Lead Channel Pacing Threshold Amplitude: 0.875 V
Lead Channel Pacing Threshold Pulse Width: 0.4 ms
Lead Channel Pacing Threshold Pulse Width: 0.4 ms
Lead Channel Sensing Intrinsic Amplitude: 0.875 mV
Lead Channel Sensing Intrinsic Amplitude: 0.875 mV
Lead Channel Sensing Intrinsic Amplitude: 10.625 mV
Lead Channel Sensing Intrinsic Amplitude: 10.625 mV
Lead Channel Setting Pacing Amplitude: 2 V
Lead Channel Setting Pacing Amplitude: 2.5 V
Lead Channel Setting Pacing Pulse Width: 0.4 ms
Lead Channel Setting Sensing Sensitivity: 0.9 mV
Zone Setting Status: 755011
Zone Setting Status: 755011

## 2023-05-10 NOTE — Patient Instructions (Addendum)
Medication Instructions:   Your physician recommends that you continue on your current medications as directed. Please refer to the Current Medication list given to you today.   *If you need a refill on your cardiac medications before your next appointment, please call your pharmacy*   Lab Work: NONE ORDERED  TODAY    If you have labs (blood work) drawn today and your tests are completely normal, you will receive your results only by: MyChart Message (if you have MyChart) OR A paper copy in the mail If you have any lab test that is abnormal or we need to change your treatment, we will call you to review the results.   Testing/Procedures: NONE ORDERED  TODAY     Follow-Up: At [facility], you and your health needs are our priority.  As part of our continuing mission to provide you with exceptional heart care, we have created designated Provider Care Teams.  These Care Teams include your primary Cardiologist (physician) and Advanced Practice Providers (APPs -  Physician Assistants and Nurse Practitioners) who all work together to provide you with the care you need, when you need it.  We recommend signing up for the patient portal called "MyChart".  Sign up information is provided on this After Visit Summary.  MyChart is used to connect with patients for Virtual Visits (Telemedicine).  Patients are able to view lab/test results, encounter notes, upcoming appointments, etc.  Non-urgent messages can be sent to your provider as well.   To learn more about what you can do with MyChart, go to [email or web address].    Your next appointment:  3 MONTHS [place]   1 YEAR   Provider:   You may see Will Jorja Loa, MD or one of the following Advanced Practice Providers on your designated Care Team:   Francis Dowse, [place] [address] [place], [place] Sherie Don, NP Canary Brim, NP   Other Instructions

## 2023-05-10 NOTE — Progress Notes (Signed)
Remote pacemaker transmission.   

## 2023-05-28 ENCOUNTER — Other Ambulatory Visit: Payer: Self-pay | Admitting: Cardiology

## 2023-05-31 ENCOUNTER — Telehealth: Payer: Self-pay | Admitting: Cardiology

## 2023-05-31 NOTE — Telephone Encounter (Signed)
Patient states he has started taking Eliquis once daily instead of BID. He has been having dizziness and thought this was contributing to his symptoms.   Provided education on Eliquis dosing and advised patient that Eliquis needed to be taking twice daily for it to provided protection from stroke. Also advised I would forward his concerns to Dr Bing Matter for review. Patient verbalized understanding and had no other questions.

## 2023-05-31 NOTE — Telephone Encounter (Signed)
Pt c/o medication issue:  1. Name of Medication:   ELIQUIS 5 MG TABS tablet    2. How are you currently taking this medication (dosage and times per day)? See below  3. Are you having a reaction (difficulty breathing--STAT)? No   4. What is your medication issue? Pt called in stating he no longer taking twice a daily. He started taking only once a day last Thursday 05/25/23 and wanted to inform Dr. Bing Matter. He also states he has been dizzy and not active at all. Please advise.

## 2023-06-05 ENCOUNTER — Encounter: Payer: Self-pay | Admitting: Cardiology

## 2023-06-05 ENCOUNTER — Other Ambulatory Visit: Payer: Self-pay

## 2023-06-05 ENCOUNTER — Encounter: Payer: Medicare Other | Admitting: Cardiology

## 2023-06-05 ENCOUNTER — Ambulatory Visit: Payer: Medicare Other | Attending: Cardiology | Admitting: Cardiology

## 2023-06-05 VITALS — BP 106/68 | HR 80 | Ht 68.0 in | Wt 163.4 lb

## 2023-06-05 DIAGNOSIS — I480 Paroxysmal atrial fibrillation: Secondary | ICD-10-CM | POA: Diagnosis present

## 2023-06-05 DIAGNOSIS — I2510 Atherosclerotic heart disease of native coronary artery without angina pectoris: Secondary | ICD-10-CM

## 2023-06-05 DIAGNOSIS — Z79899 Other long term (current) drug therapy: Secondary | ICD-10-CM

## 2023-06-05 DIAGNOSIS — I5032 Chronic diastolic (congestive) heart failure: Secondary | ICD-10-CM

## 2023-06-05 DIAGNOSIS — I6523 Occlusion and stenosis of bilateral carotid arteries: Secondary | ICD-10-CM

## 2023-06-05 MED ORDER — RIVAROXABAN 20 MG PO TABS: 20.0000 mg | ORAL_TABLET | Freq: Every day | ORAL | 0 refills | Status: DC

## 2023-06-05 MED ORDER — RIVAROXABAN 20 MG PO TABS: 20.0000 mg | ORAL_TABLET | Freq: Every day | ORAL | 3 refills | Status: DC

## 2023-06-05 NOTE — Addendum Note (Signed)
Addended by: Roxanne Mins I on: 06/05/2023 01:10 PM   Modules accepted: Orders

## 2023-06-05 NOTE — Addendum Note (Signed)
Addended by: Roxanne Mins I on: 06/05/2023 11:23 AM   Modules accepted: Orders

## 2023-06-05 NOTE — Progress Notes (Signed)
Cardiology Office Note:    Date:  06/05/2023   ID:  AHMOD ANELLI, DOB Nov 15, 1940, MRN [id number]  PCP:  Gordan Payment., MD  Cardiologist:  Gypsy Balsam, MD    Referring MD: Gordan Payment., MD   No chief complaint on file.   History of Present Illness:    Louis Barton is a 82 y.o. male   with quite complex past medical history.  That include coronary artery disease, mild nonobstructive, cardiac catheterization done in May 2022 showed 20% mid and distal circumflex as well as 20% of mid LAD lesion.  Also history of essential hypertension first-degree AV block, status post bilateral carotic endarterectomy, non-small cell lung cancer status post wedge resection and right lower lobe, remote history of TIA.  Recently he ended up being in [facility] because of episode of atrial flutter with slow ventricular rate.  He did have TEE done and then eventually electrical cardioversion.  Few months ago he came to hospital with confusion and suspicion for CVA/TIA has been raised however future evaluation showed found that he was positive for COVID-19.  Luckily recovered quite nicely.  While he was in the hospital he was bradycardic with 2-1 AV conduction.  He was eventually referred to EP team for consideration of pacemaker implantation.  Eventually end of November of last year he did have pacemaker implantation.  There was a problem with the lead to the activity will need to be revised after that he was noted to have typical atrial flutter and in March of this year he did have cardioversion and seems to maintain sinus rhythm since then.  He comes today to follow-up.  Tragically strike his wife who had severe arctic stenosis had TAVI done however shortly after that she got vascular collapse and she had perforated left ventricle surgically intervene however eventually she ended up passing.   Comes today to months for follow-up.  Overall because complaint is dizziness dizziness happen when he turns his head  quickly when he does certain motions he said he feels he is falling down he cannot stop it very first episode happened in December 18 last year when he was coming out of the restaurant fell down striking his head and the passing out went to the emergency room no intracranial bleed.  He is convinced that this episode of dizziness is because of Eliquis.  I told him pain its unlikely but we will switch him from Eliquis to Xarelto denies have any chest pain tightness squeezing pressure burning chest no palpitations dizziness leg swelling is still there.  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 On imaging;   Cramps of left lower extremity-Calf  > right calf 01/01/2015   Degeneration of lumbar intervertebral disc 11/16/2015  Formatting of this note might be different from the original. signficant DDD present with vacuum effect L4-5 and L5-S1 flat discs and anterior spurring noted   Dizziness 05/06/2020   Dyslipidemia 03/18/2015   Dyspnea on exertion 04/02/2020   Ear itch 05/14/2018   Edema 11/16/2015   Edema of both lower extremities due to peripheral venous insufficiency 02/03/2020   First degree AV block 10/20/2020   Heart failure (HCC)    History of hiatal hernia    seen on last scan- as slight    History of thrombocytopenia    History of TIAs     Hyperlipidemia    Hypertension    Irritable bowel syndrome 11/17/2016   rec trial of citrucel/ diet 11/16/2016 >>>      Local edema 02/07/2018   Malaise and fatigue 12/08/2020   Malignant neoplasm of left lung (HCC) 12/08/2017   Stage 1A. [place]. And Dr. 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 These slowly enlarging 9 mm right lower lobe pulmonary nodule along the right hemidiaphragm does not appear hypermetabolic today. Location adjacent to the hemidiaphragm can cause false negatives due to motion artifact related to the   Sprain of left ankle 05/21/2021   Stage 3a chronic kidney disease (HCC) 06/17/2020   Stroke (HCC)    Swelling of limb-Left foot 01/01/2015   Vitamin B12 deficiency 06/21/2017    Past Surgical History:  Procedure Laterality Date   adenocarcinoma  2019   Removed   CARDIOVERSION N/A 03/22/2021   Procedure: CARDIOVERSION;  Surgeon: Lewayne Bunting, MD;  Location[facility] ENDOSCOPY;  Service: Cardiovascular;  Laterality: N/A;   CARDIOVERSION N/A 12/15/2021   Procedure: CARDIOVERSION;  Surgeon: Maisie Fus, MD;  Location: [facility] ENDOSCOPY;  Service:  Cardiovascular;  Laterality: N/A;   CAROTID ENDARTERECTOMY  2005   Left carotid by Dr. Darrick Penna   CAROTID ENDARTERECTOMY  04/25/11   Right Carotid by Dr. Darrick Penna   COLONOSCOPY  02/23/2017   Colonic polyp status post polypectomy. Pancolonic diverticulosis predominatly in the sigmoid colon. Tubular adenoma   LEAD REVISION/REPAIR N/A 08/16/2021   Procedure: LEAD REVISION/REPAIR;  Surgeon: Regan Lemming, MD;  Location: [facility] INVASIVE CV LAB;  Service: Cardiovascular;  Laterality: N/A;   LEFT HEART CATH AND CORONARY ANGIOGRAPHY N/A 02/08/2021   Procedure: LEFT HEART CATH AND CORONARY ANGIOGRAPHY;  Surgeon: Kathleene Hazel, MD;  Location: [facility] INVASIVE CV LAB;  Service: Cardiovascular;  Laterality: N/A;   PACEMAKER IMPLANT N/A 07/28/2021   Procedure: PACEMAKER IMPLANT;  Surgeon: Regan Lemming, MD;  Location: [facility] INVASIVE CV LAB;  Service: Cardiovascular;  Laterality: N/A;  Septoplasty, nasal w/ submucosal resection  1960   SQUAMOUS CELL CARCINOMA EXCISION     pt said numerous times   TEE WITHOUT CARDIOVERSION N/A 03/22/2021   Procedure: TRANSESOPHAGEAL ECHOCARDIOGRAM (TEE);  Surgeon: Lewayne Bunting, MD;  Location: [facility] ENDOSCOPY;  Service: Cardiovascular;  Laterality: N/A;   TONSILLECTOMY     VIDEO ASSISTED THORACOSCOPY (VATS)/WEDGE RESECTION Right 11/09/2017   Procedure: RIGHT VIDEO ASSISTED THORACOSCOPY WITH Sabino Snipes RESECTION;  Surgeon: Loreli Slot, MD;  Location: [facility] OR;  Service: Thoracic;  Laterality: Right;    Current Medications: Current Meds  Medication Sig   acetaminophen (TYLENOL) 500 MG tablet Take 1,000 mg by mouth every 8 (eight) hours as needed for mild pain or moderate pain (for pain.).   amiodarone (PACERONE) 200 MG tablet Take 0.5 tablets (100 mg total) by mouth daily.   Cholecalciferol (VITAMIN D3) 50 MCG (2000 UT) TABS Take 4,000 Units by mouth every other day.   colesevelam (WELCHOL) 625 MG tablet Take 1,250 mg by mouth 2 (two) times daily with a meal.    Cyanocobalamin (VITAMIN B-12 IJ) Inject 1 each as directed every 30 (thirty) days.   ELIQUIS 5 MG TABS tablet TAKE 1 TABLET BY MOUTH TWICE A DAY   furosemide (LASIX) 20 MG tablet Take 40 mg by mouth daily.   Polyethyl Glycol-Propyl Glycol (SYSTANE ULTRA OP) Place 1 drop into both eyes daily. Unknown strength   rivastigmine (EXELON) 4.6 mg/24hr Place 4.6 mg onto the skin daily.   rosuvastatin (CRESTOR) 40 MG tablet Take 1 tablet (40 mg total) by mouth at bedtime.     Allergies:   Patient has no known allergies.   Social History   Socioeconomic History   Marital status: Widowed    Spouse name: Not on file   Number of children: 2   Years of education: Not on file   Highest education level: Not on file  Occupational History   Not on file  Tobacco Use   Smoking status: Former    Current packs/day: 0.00    Average packs/day: 1 pack/day for 16.0 years (16.0 ttl pk-yrs)    Types: Cigarettes    Start date: 09/26/1956    Quit date: 09/26/1972    Years since quitting: 50.7   Smokeless tobacco: Never   Tobacco comments:    Former smoker 08/11/21  Vaping Use   Vaping status: Never Used  Substance and Sexual Activity   Alcohol use: Not Currently    Alcohol/week: 7.0 standard drinks of alcohol    Types: 7 Shots of liquor per week    Comment: 1 shot daily 08/11/21   Drug use: No   Sexual activity: Not on file  Other Topics Concern   Not on file  Social History Narrative   Not on file   Social Determinants of Health   Financial Resource Strain: Not on file  Food Insecurity: Not on file  Transportation Needs: Not on file  Physical Activity: Not on file  Stress: Not on file  Social Connections: Not on file     Family History: The patient's family history includes Cancer in his brother and sister; Cancer (age of onset: 86) in his mother; Deep vein thrombosis in his mother; Heart attack in his father and mother; Heart disease in his mother; Hyperlipidemia in his mother; Hypertension in  his mother; Other in his mother and sister; Stroke in his mother; Varicose Veins in his mother. There is no history of Colon cancer, Esophageal cancer, Rectal cancer, or Stomach cancer.  ROS:   Please see the history of present illness.    All 14 point review of systems negative except as described per history of present illness  EKGs/Labs/Other Studies Reviewed:    EKG Interpretation Date/Time:  Monday June 05 2023 10:22:53 EDT Ventricular Rate:  80 PR Interval:  186 QRS Duration:  162 QT Interval:  472 QTC Calculation: 544 R Axis:   -26  Text Interpretation: AV dual-paced rhythm Abnormal ECG When compared with ECG of 10-May-2023 14:20, Vent. rate has increased BY  19 BPM Confirmed by Gypsy Balsam [phone]) on 06/05/2023 10:33:20 AM    Recent Labs: 07/04/2022: B Natriuretic Peptide 274.5 12/19/2022: TSH 2.530 03/15/2023: NT-Pro BNP 817 04/19/2023: ALT 28; BUN 17; Creatinine 0.92; Hemoglobin 14.3; Platelet Count 148; Potassium 4.1; Sodium 138  Recent Lipid Panel    Component Value Date/Time   CHOL 159 11/21/2018 0836   TRIG 82 11/21/2018 0836   HDL 59 11/21/2018 0836   CHOLHDL 2.7 11/21/2018 0836   LDLCALC 84 11/21/2018 0836    Physical Exam:    VS:  BP 106/68   Pulse 80   Ht 5\' 8"  (1.727 m)   Wt 163 lb 6.4 oz (74.1 kg)   SpO2 95%   BMI 24.84 kg/m     Wt Readings from Last 3 Encounters:  06/05/23 163 lb 6.4 oz (74.1 kg)  05/10/23 163 lb (73.9 kg)  04/21/23 162 lb 6.4 oz (73.7 kg)     GEN:  Well nourished, well developed in no acute distress HEENT: Normal NECK: No JVD; No carotid bruits LYMPHATICS: No lymphadenopathy CARDIAC: RRR, no murmurs, no rubs, no gallops RESPIRATORY:  Clear to auscultation without rales, wheezing or rhonchi  ABDOMEN: Soft, non-tender, non-distended MUSCULOSKELETAL:  No edema; No deformity  SKIN: Warm and dry LOWER EXTREMITIES: no swelling NEUROLOGIC:  Alert and oriented x 3 PSYCHIATRIC:  Normal affect   ASSESSMENT:    1. On  amiodarone therapy   2. Arteriosclerosis of coronary artery   3. Bilateral carotid artery stenosis   4. Chronic heart failure with preserved ejection fraction (HCC)   5. Paroxysmal atrial fibrillation (HCC)    PLAN:    In order of problems listed above:  Coronary disease stable from that point we will continue present management Cortical renal disease/last check more than 1-1/2-year ago showed normal carotic arteries. Chronic diastolic congestive heart failure.  Doing well from that point review chronic swelling of lower extremities managed with diuretics. Dizziness he is convinced it is related to Eliquis I doubt we will switch him to Xarelto 20 mg daily to see if it make any difference.  He will be scheduled to see neurologist for look for potential Parkinson because of his balance issue. Paroxysmal atrial fibrillation we will continue anticoagulation doing well.   Medication Adjustments/Labs and Tests Ordered: Current medicines are reviewed at length with the patient today.  Concerns regarding medicines are outlined above.  Orders Placed This Encounter  Procedures   EKG 12-Lead   Medication changes: No orders of the defined types were placed in this encounter.   Signed, Georgeanna Lea, MD, [facility] 06/05/2023 10:57 AM    [facility]

## 2023-06-05 NOTE — Patient Instructions (Signed)
Medication Instructions:  Your physician has recommended you make the following change in your medication:   STOP: Eliquis START: Xarelto 20 mg daily  *If you need a refill on your cardiac medications before your next appointment, please call your pharmacy*   Lab Work: None If you have labs (blood work) drawn today and your tests are completely normal, you will receive your results only by: MyChart Message (if you have MyChart) OR A paper copy in the mail If you have any lab test that is abnormal or we need to change your treatment, we will call you to review the results.   Testing/Procedures: None   Follow-Up: At [facility], you and your health needs are our priority.  As part of our continuing mission to provide you with exceptional heart care, we have created designated Provider Care Teams.  These Care Teams include your primary Cardiologist (physician) and Advanced Practice Providers (APPs -  Physician Assistants and Nurse Practitioners) who all work together to provide you with the care you need, when you need it.  We recommend signing up for the patient portal called "MyChart".  Sign up information is provided on this After Visit Summary.  MyChart is used to connect with patients for Virtual Visits (Telemedicine).  Patients are able to view lab/test results, encounter notes, upcoming appointments, etc.  Non-urgent messages can be sent to your provider as well.   To learn more about what you can do with MyChart, go to [email or web address].    Your next appointment:   5 month(s)  Provider:   Gypsy Balsam, MD    Other Instructions None

## 2023-06-19 ENCOUNTER — Other Ambulatory Visit: Payer: Self-pay | Admitting: Cardiology

## 2023-07-10 ENCOUNTER — Telehealth: Payer: Self-pay | Admitting: Cardiology

## 2023-07-10 NOTE — Telephone Encounter (Signed)
Patient would like to know if there is a substitution for Lasix as he feels that is the root problem of his dizziness.   Please advise  Bets number for patient [phone]

## 2023-07-18 ENCOUNTER — Encounter: Payer: Self-pay | Admitting: Podiatry

## 2023-07-18 ENCOUNTER — Ambulatory Visit (INDEPENDENT_AMBULATORY_CARE_PROVIDER_SITE_OTHER): Payer: Medicare Other | Admitting: Podiatry

## 2023-07-18 DIAGNOSIS — Q828 Other specified congenital malformations of skin: Secondary | ICD-10-CM | POA: Diagnosis not present

## 2023-07-18 DIAGNOSIS — M79674 Pain in right toe(s): Secondary | ICD-10-CM

## 2023-07-18 DIAGNOSIS — I739 Peripheral vascular disease, unspecified: Secondary | ICD-10-CM

## 2023-07-18 DIAGNOSIS — B351 Tinea unguium: Secondary | ICD-10-CM | POA: Diagnosis not present

## 2023-07-18 DIAGNOSIS — M79675 Pain in left toe(s): Secondary | ICD-10-CM | POA: Diagnosis not present

## 2023-07-18 NOTE — Progress Notes (Signed)
Subjective:  Patient ID: Louis Barton, male    DOB: [birth year],  MRN: [id number]  Chief Complaint  Patient presents with   Routine Foot Care    CHF PAD. Nail trim and callus care. Right sub 5th callus, 1st IPJs b/l. BLE Edema L>R. Also concern of mottled skin appearance medial right 1st toe    82 y.o. male presents with the above complaint. History confirmed with patient. Patient presenting with pain related to dystrophic thickened elongated nails. Patient is unable to trim own nails related to nail dystrophy and/or mobility issues. Patient does not have a history of T2DM.  Patient does have a history of peripheral arterial disease and had noted abnormal TBI on recent vascular study does have CHF and swelling in both legs as well.  Patient does  have callus present located at the plantar aspect of the fifth metatarsal head on the left foot causing pain. He does take elquis.   Objective:  Physical Exam: warm, good capillary refill nail exam onychomycosis of the toenails, onycholysis, and dystrophic nails protective sensation intact DP and PT pulses are 1+/4 palpable, edema noted to foot/ankle Left Foot:  Pain with palpation of nails due to elongation and dystrophic growth. Hyperkeratotic lesion with central hyperkeratotic core on left plantar 5th met head Right Foot: Pain with palpation of nails due to elongation and dystrophic growth.   Assessment:   1. Porokeratosis   2. Pain due to onychomycosis of toenails of both feet   3. PVD (peripheral vascular disease) (HCC)       Plan:  Patient was evaluated and treated and all questions answered.  #Hyperkeratotic lesions/pre ulcerative calluses present plantar aspect left fifth metatarsal head All symptomatic hyperkeratoses x 1 separate lesions were safely debrided with a sterile #10 blade to patient's level of comfort without incident. We discussed preventative and palliative care of these lesions including supportive and accommodative  shoegear, padding, prefabricated and custom molded accommodative orthoses, use of a pumice stone and lotions/creams daily.  #Onychomycosis with pain  -Nails palliatively debrided as below. -Educated on self-care  Procedure: Nail Debridement Rationale: Pain Type of Debridement: manual, sharp debridement. Instrumentation: Nail nipper, rotary burr. Number of Nails: 10  Return in about 3 months (around 10/18/2023) for RFC.         Corinna Gab, DPM [facility] / [facility]

## 2023-07-26 LAB — CUP PACEART REMOTE DEVICE CHECK
Battery Remaining Longevity: 104 mo
Battery Voltage: 3 V
Brady Statistic AP VP Percent: 96.43 %
Brady Statistic AP VS Percent: 1.88 %
Brady Statistic AS VP Percent: 1.47 %
Brady Statistic AS VS Percent: 0.21 %
Brady Statistic RA Percent Paced: 98.32 %
Brady Statistic RV Percent Paced: 97.91 %
Date Time Interrogation Session: 20241030074435
Implantable Lead Connection Status: 753985
Implantable Lead Connection Status: 753985
Implantable Lead Implant Date: 20221102
Implantable Lead Implant Date: 20221121
Implantable Lead Location: 753859
Implantable Lead Location: 753860
Implantable Lead Model: 3830
Implantable Lead Model: 5076
Implantable Pulse Generator Implant Date: 20221102
Lead Channel Impedance Value: 266 Ohm
Lead Channel Impedance Value: 323 Ohm
Lead Channel Impedance Value: 418 Ohm
Lead Channel Impedance Value: 437 Ohm
Lead Channel Pacing Threshold Amplitude: 0.625 V
Lead Channel Pacing Threshold Amplitude: 0.875 V
Lead Channel Pacing Threshold Pulse Width: 0.4 ms
Lead Channel Pacing Threshold Pulse Width: 0.4 ms
Lead Channel Sensing Intrinsic Amplitude: 0.375 mV
Lead Channel Sensing Intrinsic Amplitude: 0.375 mV
Lead Channel Sensing Intrinsic Amplitude: 10.625 mV
Lead Channel Sensing Intrinsic Amplitude: 10.625 mV
Lead Channel Setting Pacing Amplitude: 2 V
Lead Channel Setting Pacing Amplitude: 2.25 V
Lead Channel Setting Pacing Pulse Width: 0.4 ms
Lead Channel Setting Sensing Sensitivity: 0.9 mV
Zone Setting Status: 755011
Zone Setting Status: 755011

## 2023-07-27 ENCOUNTER — Ambulatory Visit (INDEPENDENT_AMBULATORY_CARE_PROVIDER_SITE_OTHER): Payer: Medicare Other

## 2023-07-27 DIAGNOSIS — I441 Atrioventricular block, second degree: Secondary | ICD-10-CM

## 2023-08-09 ENCOUNTER — Ambulatory Visit (INDEPENDENT_AMBULATORY_CARE_PROVIDER_SITE_OTHER): Payer: Medicare Other | Admitting: Podiatry

## 2023-08-09 ENCOUNTER — Encounter: Payer: Self-pay | Admitting: Podiatry

## 2023-08-09 DIAGNOSIS — Q828 Other specified congenital malformations of skin: Secondary | ICD-10-CM

## 2023-08-09 DIAGNOSIS — M2042 Other hammer toe(s) (acquired), left foot: Secondary | ICD-10-CM

## 2023-08-09 NOTE — Progress Notes (Signed)
Subjective:  Patient ID: Louis Barton, male    DOB: 05-Nov-1940,  MRN: [id number]  Louis Barton presents to clinic today for:  Chief Complaint  Patient presents with   Toe Pain    Toe pain left 5th MPJ with weightbearing. Does have porokeratotic lesion plantar aspect treated by Dr. Annamary Rummage however pain is more plantar lateral associated with the met head   Patient presents today with a sore skin lesion on the plantar aspect the left foot.  He feels this may have been missed on his last visit when they typically get shaved.  He does not wear the same pair shoes daily.  No new shoes are noted.  Denies stepping on a foreign object.  He notes that he is starting to get hip pain on the contralateral limb due to pain avoidance on the left foot  PCP is Gordan Payment., MD.  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 signficant DDD present with vacuum effect L4-5 and L5-S1 flat discs and anterior spurring noted   Dizziness 05/06/2020   Dyslipidemia 03/18/2015   Dyspnea on exertion 04/02/2020   Ear itch 05/14/2018   Edema 11/16/2015   Edema of both  lower extremities due to peripheral venous insufficiency 02/03/2020   First degree AV block 10/20/2020   Heart failure (HCC)    History of hiatal hernia    seen on last scan- as slight    History of thrombocytopenia    History of TIAs    Hyperlipidemia    Hypertension    Irritable bowel syndrome 11/17/2016   rec trial of citrucel/ diet 11/16/2016 >>>      Local edema 02/07/2018   Malaise and fatigue 12/08/2020   Malignant neoplasm of left lung (HCC) 12/08/2017   Stage 1A. [place]. And Dr. 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 Renal parapelvic cysts 10/06/16 on CT , right kidney midpole 1.2 cm, right kidney anterior pole 1.1 cm, and to the left kidney lower pole anterior position 3.5cm all felt to be benign   Right bundle branch block 10/20/2020   Right lower lobe pulmonary nodule 11/09/2017   Shingles    internal- obstruction of bowels & gallbladder   Solitary pulmonary nodule 11/16/2016   CT  ABd 03/24/09 linerar densities in RLL and RML c/w scarring o/w clear CT Abd 10/20/16 c/w 7 mm nodule  - CT chest 02/13/2017 :  slt enlarged to 9 mm   - PET 04/04/17 :1. These slowly enlarging 9 mm right lower lobe pulmonary nodule along the right hemidiaphragm does not appear hypermetabolic today. Location adjacent to the hemidiaphragm can cause false negatives due to motion artifact related to the   Sprain of left ankle 05/21/2021   Stage 3a chronic kidney disease (HCC) 06/17/2020   Stroke (HCC)    Swelling of limb-Left foot 01/01/2015   Vitamin B12 deficiency 06/21/2017    Past Surgical History:  Procedure Laterality Date   adenocarcinoma  2019   Removed   CARDIOVERSION N/A 03/22/2021   Procedure:  CARDIOVERSION;  Surgeon: Lewayne Bunting, MD;  Location: [facility] ENDOSCOPY;  Service: Cardiovascular;  Laterality: N/A;   CARDIOVERSION N/A 12/15/2021   Procedure: CARDIOVERSION;  Surgeon: Maisie Fus, MD;  Location: [facility] ENDOSCOPY;  Service: Cardiovascular;  Laterality: N/A;   CAROTID ENDARTERECTOMY  2005   Left carotid by Dr. Darrick Penna   CAROTID ENDARTERECTOMY  04/25/11   Right Carotid by Dr. Darrick Penna   COLONOSCOPY  02/23/2017   Colonic polyp status post polypectomy. Pancolonic diverticulosis predominatly in the sigmoid colon. Tubular adenoma   LEAD REVISION/REPAIR N/A 08/16/2021   Procedure: LEAD REVISION/REPAIR;  Surgeon: Regan Lemming, MD;  Location: [facility] INVASIVE CV LAB;  Service: Cardiovascular;  Laterality: N/A;   LEFT HEART CATH AND CORONARY ANGIOGRAPHY N/A 02/08/2021   Procedure: LEFT HEART CATH AND CORONARY ANGIOGRAPHY;  Surgeon: Kathleene Hazel, MD;  Location: [facility] INVASIVE CV LAB;  Service: Cardiovascular;  Laterality: N/A;   PACEMAKER IMPLANT N/A 07/28/2021   Procedure: PACEMAKER IMPLANT;  Surgeon: Regan Lemming, MD;  Location: [facility] INVASIVE CV LAB;  Service: Cardiovascular;  Laterality: N/A;   Septoplasty, nasal w/ submucosal resection  1960   SQUAMOUS CELL CARCINOMA EXCISION     pt said numerous times   TEE WITHOUT CARDIOVERSION N/A 03/22/2021   Procedure: TRANSESOPHAGEAL ECHOCARDIOGRAM (TEE);  Surgeon: Lewayne Bunting, MD;  Location: [facility] ENDOSCOPY;  Service: Cardiovascular;  Laterality: N/A;   TONSILLECTOMY     VIDEO ASSISTED THORACOSCOPY (VATS)/WEDGE RESECTION Right 11/09/2017   Procedure: RIGHT VIDEO ASSISTED THORACOSCOPY WITH Sabino Snipes RESECTION;  Surgeon: Loreli Slot, MD;  Location: [facility] OR;  Service: Thoracic;  Laterality: Right;   No Known Allergies  Objective:  Louis Barton is a pleasant 82 y.o. male in NAD. AAO x 3.  Vascular Examination: Capillary refill time is 3-5 seconds to toes bilateral. Palpable pedal pulses b/l LE. Digital hair sparse b/l.   Skin temperature gradient WNL b/l. No cyanosis or clubbing noted b/l.  There is generalized +1 pitting edema to the left lower extremity  Dermatological Examination: Pedal skin with normal turgor, texture and tone b/l. No open wounds. No interdigital macerations b/l.   Focal hyperkeratotic lesion present, with pain on palpation, located submet 5 left foot.  No surrounding erythema or edema is noted with the lesion.  Musculoskeletal Examination: Muscle strength 5/5 to all LE muscle groups b/l.  Contracture of lesser toes left foot with anterior displacement of fat pad.   Assessment/Plan: 1. Porokeratosis   2. Hammertoe of left foot     The hyperkeratotic lesions were sharply debrided/shaved with sterile #313 blade.  Salinocaine and a Band-Aid were applied.  The patient's shoe insole was removed and a reverse dancers pad was placed on the area.  Upon weightbearing  he noted immediate improvement of his symptoms.  Discussed progressing to a custom orthotic that will last longer than a felt pad if he continues to have improvement with the shoe modification.  Discussed with the patient what is causing the corns/calluses and reviewed treatment options today, including shaving the the painful lesion(s), off-loading techniques and pads, custom orthotics / shoe modifications.    Follow-up as previously scheduled for routine footcare  Yuridia Couts D. Tayloranne Lekas, DPM, FACFAS [facility]     [address] [place], [place] [address]                Office [phone]  Fax [phone]

## 2023-08-09 NOTE — Progress Notes (Signed)
Remote pacemaker transmission.   

## 2023-08-17 ENCOUNTER — Encounter: Payer: Self-pay | Admitting: Neurology

## 2023-08-17 ENCOUNTER — Ambulatory Visit
Admission: RE | Admit: 2023-08-17 | Discharge: 2023-08-17 | Disposition: A | Discharge status: Home / Self Care | Payer: Medicare Other | Source: Ambulatory Visit | Attending: Neurology | Admitting: Neurology

## 2023-08-17 ENCOUNTER — Ambulatory Visit: Payer: Medicare Other | Admitting: Neurology

## 2023-08-17 VITALS — BP 145/78 | HR 79 | Ht 68.0 in | Wt 163.0 lb

## 2023-08-17 DIAGNOSIS — R269 Unspecified abnormalities of gait and mobility: Secondary | ICD-10-CM | POA: Diagnosis not present

## 2023-08-17 DIAGNOSIS — G3184 Mild cognitive impairment, so stated: Secondary | ICD-10-CM

## 2023-08-17 NOTE — Progress Notes (Signed)
Chief Complaint  Patient presents with   Gait Problem    Rm 14 with daughter Valentina Gu Pt is well, reports he has been having concerns with his gait, imbalance and dizziness since Nov 2022. Symptoms have progressed over time.       ASSESSMENT AND PLAN  Louis Barton is a 82 y.o. male   Slow worsening gait abnormality since 2022,  No significant Parkinsonism features on examination, hyperreflexia, bilateral Babinski signs, cautious mildly stiff gait,  Most worried about cervical myelopathy,  X-ray of cervical spine, if there is significant degenerative changes noted, will proceed with CT myelogram, not a candidate for MRI due to pacemaker placement  DIAGNOSTIC DATA (LABS, IMAGING, TESTING) - I reviewed patient records, labs, notes, testing and imaging myself where available.   MEDICAL HISTORY:  Louis Barton, is a 82 year old male, accompanied by his daughter seen in request by his primary care from [facility] Dr. Shary Decamp, Tammy Sours for evaluation of unsteady gait, initial evaluation was on August 17, 2023  History is obtained from the patient and review of electronic medical records. I personally reviewed pertinent available imaging films in PACS.   PMHx of  Hx of Vit B12 deficiency. 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 Lung Cancer, carcinoma, right lower lobectomy  He still work as a Water engineer, spends a lot of time in front of the computer, just recently began to go to the gym, walking more regularly, average step [phone] daily  He underwent a lot of stress, lost his wife in April 2023, lives alone  Since he suffered COVID end of 2022, he noticed mild unsteady gait, gradually getting worse, tendency to lean forward, his gait improves with faster pace,  He also complains of mild neck pain, limited range of motion, worsening urinary urgency he contributed to Lasix use     PHYSICAL EXAM:   Vitals:   08/17/23 1047  BP: (!) 145/78  Pulse: 79  Weight: 163 lb (73.9 kg)  Height: 5\' 8"  (1.727 m)    Body mass index is 24.78 kg/m.  PHYSICAL EXAMNIATION:  Gen: NAD, conversant, well nourised, well groomed                     Cardiovascular: Regular rate rhythm, no peripheral edema, warm, nontender. Eyes: Conjunctivae clear without exudates or hemorrhage Neck:  no carotid bruits.  Mild limited range of motion of neck turning Pulmonary: Clear to auscultation bilaterally   NEUROLOGICAL EXAM:  MENTAL STATUS: Speech/cognition: Awake, alert, oriented to history taking and casual conversation CRANIAL NERVES: CN II: Visual fields are full to confrontation.  Small pupils  reactive to light. CN III, IV, VI: extraocular movement are normal. No ptosis. CN V: Facial sensation is intact to light touch CN VII: Face is symmetric with normal eye closure  CN VIII: Hearing is normal to causal conversation. CN IX, X: Phonation is normal. CN XI: Head turning and shoulder shrug are intact  MOTOR: There is no pronator drift of out-stretched arms. Muscle bulk and tone are normal. Muscle strength is normal.  REFLEXES: Reflexes are 2+ and symmetric at the biceps, triceps, 3/3 knees, and trace at ankles. Plantar responses are extensor bilaterally  SENSORY: Mildly length-dependent decreased pinprick to ankle level, decreased vibratory sensation in distal leg  COORDINATION: There is no trunk or limb dysmetria noted.  GAIT/STANCE: Able to get up from seated position arm crossed,  mildly unsteady, stiff,    REVIEW OF SYSTEMS:  Full 14 system review of systems performed and notable only for as above All other review of systems were negative.   ALLERGIES: No Known Allergies  HOME MEDICATIONS: Current Outpatient Medications  Medication Sig Dispense Refill   acetaminophen (TYLENOL) 500 MG tablet Take 1,000 mg by mouth every 8 (eight) hours as needed for mild pain or  moderate pain (for pain.).     amiodarone (PACERONE) 200 MG tablet Take 0.5 tablets (100 mg total) by mouth daily. 45 tablet 1   Cholecalciferol (VITAMIN D3) 50 MCG (2000 UT) TABS Take 4,000 Units by mouth every other day.     colesevelam (WELCHOL) 625 MG tablet Take 1,250 mg by mouth 2 (two) times daily with a meal.     Cyanocobalamin (VITAMIN B-12 IJ) Inject 1 each as directed every 30 (thirty) days.     furosemide (LASIX) 20 MG tablet Take 40 mg by mouth 2 (two) times daily.     Polyethyl Glycol-Propyl Glycol (SYSTANE ULTRA OP) Place 1 drop into both eyes daily. Unknown strength     rivaroxaban (XARELTO) 20 MG TABS tablet Take 1 tablet (20 mg total) by mouth daily with supper. 90 tablet 3   rivastigmine (EXELON) 4.6 mg/24hr Place 4.6 mg onto the skin daily.     rosuvastatin (CRESTOR) 40 MG tablet Take 1 tablet (40 mg total) by mouth at bedtime.     No current facility-administered medications for this visit.    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 signficant DDD present with vacuum effect L4-5 and L5-S1 flat discs and  anterior spurring noted   Dizziness 05/06/2020   Dyslipidemia 03/18/2015   Dyspnea on exertion 04/02/2020   Ear itch 05/14/2018   Edema 11/16/2015   Edema of both lower extremities due to peripheral venous insufficiency 02/03/2020   First degree AV block 10/20/2020   Heart failure (HCC)    History of hiatal hernia    seen on last scan- as slight    History of thrombocytopenia    History of TIAs    Hyperlipidemia    Hypertension    Irritable bowel syndrome 11/17/2016   rec trial of citrucel/ diet 11/16/2016 >>>      Local edema 02/07/2018   Malaise and fatigue 12/08/2020   Malignant neoplasm of left lung (HCC) 12/08/2017   Stage 1A. [place]. And Dr. Sherene Sires   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   Occlusion and stenosis of carotid artery without mention of cerebral infarction 12/01/2011   PAD (peripheral artery  disease) (HCC)    Prediabetes 12/25/2018   Preop cardiovascular exam 04/14/2011   Renal cysts, acquired, bilateral 10/24/2016   Formatting of this note might be different from the original. 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 These slowly enlarging 9 mm right lower lobe pulmonary nodule along the right hemidiaphragm does not appear hypermetabolic today. Location adjacent to the hemidiaphragm can cause false negatives due to motion artifact related to the   Sprain of left ankle 05/21/2021   Stage 3a chronic kidney disease (HCC) 06/17/2020   Stroke (HCC)    Swelling of limb-Left foot 01/01/2015    Vitamin B12 deficiency 06/21/2017    PAST SURGICAL HISTORY: Past Surgical History:  Procedure Laterality Date   adenocarcinoma  2019   Removed   CARDIOVERSION N/A 03/22/2021   Procedure: CARDIOVERSION;  Surgeon: Lewayne Bunting, MD;  Location: [facility] ENDOSCOPY;  Service: Cardiovascular;  Laterality: N/A;   CARDIOVERSION N/A 12/15/2021   Procedure: CARDIOVERSION;  Surgeon: Maisie Fus, MD;  Location: [facility] ENDOSCOPY;  Service: Cardiovascular;  Laterality: N/A;   CAROTID ENDARTERECTOMY  2005   Left carotid by Dr. Darrick Penna   CAROTID ENDARTERECTOMY  04/25/11   Right Carotid by Dr. Darrick Penna   COLONOSCOPY  02/23/2017   Colonic polyp status post polypectomy. Pancolonic diverticulosis predominatly in the sigmoid colon. Tubular adenoma   LEAD REVISION/REPAIR N/A 08/16/2021   Procedure: LEAD REVISION/REPAIR;  Surgeon: Regan Lemming, MD;  Location: [facility] INVASIVE CV LAB;  Service: Cardiovascular;  Laterality: N/A;   LEFT HEART CATH AND CORONARY ANGIOGRAPHY N/A 02/08/2021   Procedure: LEFT HEART CATH AND CORONARY ANGIOGRAPHY;  Surgeon: Kathleene Hazel, MD;  Location: [facility] INVASIVE CV LAB;  Service: Cardiovascular;  Laterality: N/A;   PACEMAKER IMPLANT N/A 07/28/2021   Procedure: PACEMAKER IMPLANT;  Surgeon: Regan Lemming, MD;  Location: [facility] INVASIVE CV LAB;  Service: Cardiovascular;  Laterality: N/A;   Septoplasty, nasal w/ submucosal resection  1960   SQUAMOUS CELL CARCINOMA EXCISION     pt said numerous times   TEE WITHOUT CARDIOVERSION N/A 03/22/2021   Procedure: TRANSESOPHAGEAL ECHOCARDIOGRAM (TEE);  Surgeon: Lewayne Bunting, MD;  Location: [facility] ENDOSCOPY;  Service: Cardiovascular;  Laterality: N/A;   TONSILLECTOMY     VIDEO ASSISTED THORACOSCOPY (VATS)/WEDGE RESECTION Right 11/09/2017   Procedure: RIGHT VIDEO ASSISTED THORACOSCOPY WITH Sabino Snipes RESECTION;  Surgeon: Loreli Slot, MD;  Location: [facility] OR;  Service: Thoracic;  Laterality: Right;    FAMILY HISTORY: Family History   Problem Relation Age of Onset   Cancer Mother 40       Breast   Stroke Mother    Hyperlipidemia Mother    Other Mother        varicose veins   Heart attack Mother    Heart disease Mother        Left ankle swelling   Hypertension Mother    Deep vein thrombosis Mother    Varicose Veins Mother  Heart attack Father    Other Sister        Tumor in Lung and Brain   Cancer Sister        Tumor   Lung  and  Brain   Cancer Brother        BCC-SCC-Merkle Cell   Colon cancer Neg Hx    Esophageal cancer Neg Hx    Rectal cancer Neg Hx    Stomach cancer Neg Hx     SOCIAL HISTORY: Social History   Socioeconomic History   Marital status: Widowed    Spouse name: Not on file   Number of children: 2   Years of education: Not on file   Highest education level: Not on file  Occupational History   Not on file  Tobacco Use   Smoking status: Former    Current packs/day: 0.00    Average packs/day: 1 pack/day for 16.0 years (16.0 ttl pk-yrs)    Types: Cigarettes    Start date: 09/26/1956    Quit date: 09/26/1972    Years since quitting: 50.9   Smokeless tobacco: Never   Tobacco comments:    Former smoker 08/11/21  Vaping Use   Vaping status: Never Used  Substance and Sexual Activity   Alcohol use: Not Currently    Alcohol/week: 7.0 standard drinks of alcohol    Types: 7 Shots of liquor per week    Comment: 1 shot daily 08/11/21   Drug use: No   Sexual activity: Not on file  Other Topics Concern   Not on file  Social History Narrative   Not on file   Social Determinants of Health   Financial Resource Strain: Not on file  Food Insecurity: Not on file  Transportation Needs: Not on file  Physical Activity: Not on file  Stress: Not on file  Social Connections: Not on file  Intimate Partner Violence: Not on file      Levert Feinstein, M.D. Ph.D.  [facility] [address] [place], [place] [address] Ph: [phone] Fax: [phone]  CC:  Gordan Payment., MD [address] [place],  [place] [address]  Gordan Payment., MD

## 2023-09-03 ENCOUNTER — Emergency Department (HOSPITAL_BASED_OUTPATIENT_CLINIC_OR_DEPARTMENT_OTHER): Payer: Medicare Other

## 2023-09-03 ENCOUNTER — Encounter (HOSPITAL_BASED_OUTPATIENT_CLINIC_OR_DEPARTMENT_OTHER): Payer: Self-pay | Admitting: Emergency Medicine

## 2023-09-03 ENCOUNTER — Emergency Department (HOSPITAL_BASED_OUTPATIENT_CLINIC_OR_DEPARTMENT_OTHER)
Admission: EM | Admit: 2023-09-03 | Discharge: 2023-09-03 | Disposition: A | Discharge status: Home / Self Care | Payer: Medicare Other | Source: Home / Self Care | Attending: Emergency Medicine | Admitting: Emergency Medicine

## 2023-09-03 ENCOUNTER — Ambulatory Visit (HOSPITAL_BASED_OUTPATIENT_CLINIC_OR_DEPARTMENT_OTHER)
Admission: EM | Admit: 2023-09-03 | Discharge: 2023-09-03 | Disposition: A | Discharge status: Home / Self Care | Payer: Medicare Other

## 2023-09-03 ENCOUNTER — Encounter (HOSPITAL_BASED_OUTPATIENT_CLINIC_OR_DEPARTMENT_OTHER): Payer: Self-pay

## 2023-09-03 ENCOUNTER — Other Ambulatory Visit: Payer: Self-pay

## 2023-09-03 DIAGNOSIS — R519 Headache, unspecified: Secondary | ICD-10-CM | POA: Insufficient documentation

## 2023-09-03 DIAGNOSIS — R109 Unspecified abdominal pain: Secondary | ICD-10-CM | POA: Insufficient documentation

## 2023-09-03 DIAGNOSIS — W19XXXA Unspecified fall, initial encounter: Secondary | ICD-10-CM

## 2023-09-03 DIAGNOSIS — Z85118 Personal history of other malignant neoplasm of bronchus and lung: Secondary | ICD-10-CM | POA: Insufficient documentation

## 2023-09-03 DIAGNOSIS — R0781 Pleurodynia: Secondary | ICD-10-CM | POA: Insufficient documentation

## 2023-09-03 DIAGNOSIS — Z950 Presence of cardiac pacemaker: Secondary | ICD-10-CM | POA: Insufficient documentation

## 2023-09-03 DIAGNOSIS — Z8673 Personal history of transient ischemic attack (TIA), and cerebral infarction without residual deficits: Secondary | ICD-10-CM | POA: Insufficient documentation

## 2023-09-03 DIAGNOSIS — R296 Repeated falls: Secondary | ICD-10-CM

## 2023-09-03 DIAGNOSIS — R55 Syncope and collapse: Secondary | ICD-10-CM | POA: Diagnosis not present

## 2023-09-03 DIAGNOSIS — M79605 Pain in left leg: Secondary | ICD-10-CM | POA: Insufficient documentation

## 2023-09-03 DIAGNOSIS — M25552 Pain in left hip: Secondary | ICD-10-CM | POA: Insufficient documentation

## 2023-09-03 DIAGNOSIS — W01198A Fall on same level from slipping, tripping and stumbling with subsequent striking against other object, initial encounter: Secondary | ICD-10-CM | POA: Insufficient documentation

## 2023-09-03 DIAGNOSIS — Z7901 Long term (current) use of anticoagulants: Secondary | ICD-10-CM | POA: Insufficient documentation

## 2023-09-03 DIAGNOSIS — S80211A Abrasion, right knee, initial encounter: Secondary | ICD-10-CM | POA: Insufficient documentation

## 2023-09-03 DIAGNOSIS — I951 Orthostatic hypotension: Secondary | ICD-10-CM | POA: Diagnosis not present

## 2023-09-03 DIAGNOSIS — I509 Heart failure, unspecified: Secondary | ICD-10-CM | POA: Insufficient documentation

## 2023-09-03 LAB — CBC WITH DIFFERENTIAL/PLATELET
Abs Immature Granulocytes: 0.03 10*3/uL (ref 0.00–0.07)
Basophils Absolute: 0 10*3/uL (ref 0.0–0.1)
Basophils Relative: 0 %
Eosinophils Absolute: 0.1 10*3/uL (ref 0.0–0.5)
Eosinophils Relative: 1 %
HCT: 47.7 % (ref 39.0–52.0)
Hemoglobin: 15.3 g/dL (ref 13.0–17.0)
Immature Granulocytes: 0 %
Lymphocytes Relative: 19 %
Lymphs Abs: 1.6 10*3/uL (ref 0.7–4.0)
MCH: 30.7 pg (ref 26.0–34.0)
MCHC: 32.1 g/dL (ref 30.0–36.0)
MCV: 95.6 fL (ref 80.0–100.0)
Monocytes Absolute: 0.7 10*3/uL (ref 0.1–1.0)
Monocytes Relative: 9 %
Neutro Abs: 6.2 10*3/uL (ref 1.7–7.7)
Neutrophils Relative %: 71 %
Platelets: 124 10*3/uL — ABNORMAL LOW (ref 150–400)
RBC: 4.99 MIL/uL (ref 4.22–5.81)
RDW: 14.5 % (ref 11.5–15.5)
WBC: 8.7 10*3/uL (ref 4.0–10.5)
nRBC: 0 % (ref 0.0–0.2)

## 2023-09-03 LAB — COMPREHENSIVE METABOLIC PANEL
ALT: 26 U/L (ref 0–44)
AST: 29 U/L (ref 15–41)
Albumin: 3.8 g/dL (ref 3.5–5.0)
Alkaline Phosphatase: 51 U/L (ref 38–126)
Anion gap: 9 (ref 5–15)
BUN: 15 mg/dL (ref 8–23)
CO2: 25 mmol/L (ref 22–32)
Calcium: 8.6 mg/dL — ABNORMAL LOW (ref 8.9–10.3)
Chloride: 104 mmol/L (ref 98–111)
Creatinine, Ser: 0.96 mg/dL (ref 0.61–1.24)
GFR, Estimated: >60 mL/min (ref >60)
Glucose, Bld: 85 mg/dL (ref 70–99)
Potassium: 4.3 mmol/L (ref 3.5–5.1)
Sodium: 138 mmol/L (ref 135–145)
Total Bilirubin: 0.9 mg/dL (ref <1.2)
Total Protein: 6.6 g/dL (ref 6.5–8.1)

## 2023-09-03 MED ORDER — IOHEXOL 300 MG/ML  SOLN
75.0000 mL | Freq: Once | INTRAMUSCULAR | Status: AC | PRN
  Administered 2023-09-03: 75 mL via INTRAVENOUS

## 2023-09-03 NOTE — ED Provider Notes (Addendum)
[place] [facility] AT [facility] Provider Note   CSN: [id number] Arrival date & time: 09/03/23  1413     History  Chief Complaint  Patient presents with   Avid Sollie is a 82 y.o. male.   Fall  Patient presents to the ED post fall which happened both yesterday and today.  Was seen in urgent care today and recommended to come to the ED due to him being on Xarelto, lethargy, intermittent dizziness.  Previous history of A-fib, pacemaker, lung cancer, stroke, heart failure with preserved ejection fraction.  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  Today he complains of left lower rib pain, left hip pain, left leg pain after the fall where he hit a chair onto his left ribs and landed on his left buttock.  Denies LOC denies head trauma.  However daughter states that he has "been acting slightly different" and when pressed further describes him as being more lethargic than normal.  He states that he started taking his losartan 3 days ago because he noticed his blood pressure being around 150 systolic but has previously been off it since earlier this year.  Denies chest pain, headache, fever, vision changes, shortness of breath, abdominal pain, nausea, vomiting, dysuria, hematuria, diarrhea, constipation, weakness, numbness, tingling, sensory deficits.     Home Medications Prior to Admission medications   Medication Sig Start Date End Date Taking? Authorizing Provider  acetaminophen (TYLENOL) 500 MG tablet Take 1,000 mg by mouth every 8 (eight) hours as needed for mild pain or moderate pain (for pain.).    [provider]  amiodarone (PACERONE) 200 MG tablet Take 0.5 tablets (100 mg total) by mouth daily. 04/25/23   Camnitz, Andree Coss, MD  Cholecalciferol (VITAMIN D3) 50 MCG (2000 UT) TABS Take 4,000 Units by mouth every other day.    [provider]  colesevelam (WELCHOL) 625 MG tablet Take 1,250 mg by mouth 2 (two) times daily with a meal.    [provider]  Cyanocobalamin (VITAMIN B-12 IJ) Inject 1 each as directed every 30 (thirty) days.    [provider]  furosemide (LASIX) 20 MG tablet Take 40 mg by mouth 2 (two) times daily.    [provider]  Polyethyl Glycol-Propyl Glycol (SYSTANE ULTRA OP) Place 1 drop into both eyes daily. Unknown strength    [provider]  rivaroxaban (XARELTO) 20 MG TABS tablet Take 1 tablet (20 mg total) by mouth daily with supper. 06/05/23   Georgeanna Lea, MD  rivastigmine (EXELON) 4.6 mg/24hr Place 4.6 mg onto the skin daily.    [provider]  rosuvastatin (CRESTOR) 40 MG tablet Take 1 tablet (40 mg total) by mouth at bedtime. 05/13/21   Zigmund Daniel., MD      Allergies    Patient has no known allergies.    Review of Systems   Review of Systems  Musculoskeletal:  Positive for arthralgias and myalgias.  Neurological:  Positive for dizziness.  All other systems reviewed and are negative.   Physical Exam Updated Vital Signs BP (!) 156/88   Pulse 60   Temp 97.8 F (36.6 C) (Oral)   Resp 15   Ht 5\' 8"  (1.727 m)   Wt 72.1 kg   SpO2 96%   BMI 24.17 kg/m  Physical Exam Vitals and nursing note reviewed.  Constitutional:      General: He is not in acute distress.    Appearance: Normal appearance.  HENT:     Head: Normocephalic and atraumatic.  Eyes:     General: No scleral icterus.       Right eye: No discharge.        Left eye: No discharge.     Extraocular Movements: Extraocular movements intact.     Conjunctiva/sclera: Conjunctivae normal.     Pupils: Pupils are equal, round, and reactive to light.  Cardiovascular:     Rate and Rhythm: Normal rate and regular rhythm.     Pulses: Normal pulses.     Heart sounds: Normal heart sounds. No murmur heard.    No friction rub. No gallop.  Pulmonary:     Effort:  Pulmonary effort is normal. No respiratory distress.     Breath sounds: Normal breath sounds.  Abdominal:     General: Abdomen is flat.     Palpations: Abdomen is soft.     Tenderness: There is no abdominal tenderness. There is no right CVA tenderness or left CVA tenderness.  Musculoskeletal:        General: Tenderness (Tenderness noted over left ribs 10 through 12, left leg over greater trochanter, pelvis left side) and signs of injury (Right knee is noted to have 1 x 2 cm abrasion, without tenderness and with full ROM) present. No swelling or deformity.     Cervical back: Normal range of motion and neck supple. No rigidity or tenderness.  Lymphadenopathy:     Cervical: No cervical adenopathy.  Skin:    General: Skin is warm and dry.  Neurological:     General: No focal deficit present.     Mental Status: He is alert. Mental status is at baseline.  Psychiatric:        Mood and Affect: Mood normal.     ED Results / Procedures / Treatments   Labs (all labs ordered are listed, but only abnormal results are displayed) Labs Reviewed  CBC WITH DIFFERENTIAL/PLATELET - Abnormal; Notable for the following components:      Result Value   Platelets 124 (*)    All other components within normal limits  COMPREHENSIVE METABOLIC PANEL - Abnormal; Notable for the following components:   Calcium 8.6 (*)    All other components within normal limits  URINALYSIS, ROUTINE W REFLEX MICROSCOPIC    EKG EKG Interpretation Date/Time:  Sunday September 03 2023 15:23:00 EST Ventricular Rate:  61 PR Interval:    QRS Duration:  156 QT Interval:  498 QTC Calculation: 502 R Axis:   -26  Text Interpretation: paced Left bundle branch block when compared top rior, similar paced rhythm NO STEMI Reconfirmed by Theda Belfast ([id number]) on 09/03/2023 3:43:10 PM  Radiology CT CHEST ABDOMEN PELVIS W CONTRAST  Result Date: 09/03/2023 CLINICAL DATA:  Fall from standing with pain. EXAM: CT CHEST, ABDOMEN, AND  PELVIS WITH CONTRAST TECHNIQUE: Multidetector CT imaging of the chest, abdomen and pelvis was performed following the standard protocol during bolus administration of intravenous contrast. RADIATION DOSE REDUCTION: This exam was performed according to the departmental dose-optimization program which includes automated exposure control, adjustment of the mA and/or kV according to patient size and/or use of iterative reconstruction technique. CONTRAST:  75mL OMNIPAQUE IOHEXOL 300 MG/ML  SOLN COMPARISON:  CT chest, abdomen, and pelvis dated 10/20/2022. FINDINGS: CT CHEST FINDINGS Cardiovascular: Vascular calcifications are seen in the coronary arteries and aortic arch. The heart is enlarged. No  pericardial effusion. A left subclavian cardiac device is redemonstrated. Mediastinum/Nodes: No enlarged mediastinal, hilar, or axillary lymph nodes. Thyroid gland, trachea, and esophagus demonstrate no significant findings. Lungs/Pleura: Postoperative changes and round atelectasis/scarring are redemonstrated in the right lower lung. There is a small right pleural effusion with associated atelectasis. A there is a trace left pleural effusion with associated atelectasis. No pneumothorax on either side. Musculoskeletal: There are acute fractures of the left ninth and tenth ribs. Degenerative changes are seen in the spine. CT ABDOMEN PELVIS FINDINGS Hepatobiliary: No focal liver abnormality is seen. No gallstones, gallbladder wall thickening, or biliary dilatation. Pancreas: Unremarkable. No pancreatic ductal dilatation or surrounding inflammatory changes. Spleen: Normal in size without focal abnormality. Adrenals/Urinary Tract: Adrenal glands are unremarkable. Bilateral renal cysts measure up to 2.0 cm on the right and 4.4 cm on the left. No imaging follow-up is recommended for this finding. No renal calculi or hydronephrosis on either side. Bladder is unremarkable. Stomach/Bowel: Stomach is within normal limits. A lipoma in the  duodenum is redemonstrated. The appendix is not definitely identified and is likely surgically absent. There is colonic diverticulosis without evidence of diverticulitis. No evidence of bowel wall thickening, distention, or inflammatory changes. Vascular/Lymphatic: Aortic atherosclerosis. No enlarged abdominal or pelvic lymph nodes. Reproductive: Prostate is unremarkable. Other: There is soft tissue attenuation in the left inguinal canal which has developed since prior exam. Musculoskeletal: Degenerative changes are seen in the spine. IMPRESSION: 1. Acute fractures of the left ninth and tenth ribs. No pneumothorax. 2. Small right and trace left pleural effusions with associated atelectasis. 3. Soft tissue attenuation in the left inguinal canal has developed since prior exam. This may represent a hematoma given the history of trauma. Aortic Atherosclerosis (ICD10-I70.0). Electronically Signed   By: Romona Curls M.D.   On: 09/03/2023 18:11   DG Ribs Unilateral W/Chest Left  Result Date: 09/03/2023 CLINICAL DATA:  Left lower rib pain.  Multiple recent falls. EXAM: LEFT RIBS AND CHEST - 3+ VIEW COMPARISON:  CT 10/20/2022. FINDINGS: Acute, nondisplaced fracture of the anterior aspect of the ninth rib. Slightly angulated fracture involving the anterior aspect of the left tenth rib. Left chest wall pacer device is noted with leads in the right atrial appendage and right ventricle. Cardiac enlargement and aortic atherosclerosis. Postoperative changes noted within the right lower lung with surrounding parenchymal scarring, architectural distortion and pleural thickening. This appears similar to chest CT from 10/20/2022. IMPRESSION: 1. Acute fractures of the anterior aspects of the left ninth and tenth ribs. 2. Cardiac enlargement and aortic atherosclerosis. 3. Postoperative changes within the right lower lung with surrounding pleural thickening and scarring. Electronically Signed   By: Signa Kell M.D.   On:  09/03/2023 16:22   DG Femur 1 View Left  Result Date: 09/03/2023 CLINICAL DATA:  Left-sided pain post recent falls EXAM: LEFT FEMUR 1 VIEW COMPARISON:  None Available. FINDINGS: There is no evidence of fracture or other focal bone lesions. Soft tissues are unremarkable. Patchy arterial calcifications. IMPRESSION: Negative. Electronically Signed   By: Corlis Leak M.D.   On: 09/03/2023 16:20   DG Pelvis 1-2 Views  Result Date: 09/03/2023 CLINICAL DATA:  Recent falls, left-sided pain EXAM: PELVIS - 1-2 VIEW COMPARISON:  CT 10/20/2022 FINDINGS: There is no evidence of pelvic fracture or diastasis. No pelvic bone lesions are seen. Degenerative disc disease in the lower lumbar spine. IMPRESSION: 1. No acute findings. 2. Lower lumbar degenerative disc disease. Electronically Signed   By: Ronald Pippins.D.  On: 09/03/2023 16:20   CT Head Wo Contrast  Result Date: 09/03/2023 CLINICAL DATA:  Multiple recent falls, on anticoagulation EXAM: CT HEAD WITHOUT CONTRAST CT CERVICAL SPINE WITHOUT CONTRAST TECHNIQUE: Multidetector CT imaging of the head and cervical spine was performed following the standard protocol without intravenous contrast. Multiplanar CT image reconstructions of the cervical spine were also generated. RADIATION DOSE REDUCTION: This exam was performed according to the departmental dose-optimization program which includes automated exposure control, adjustment of the mA and/or kV according to patient size and/or use of iterative reconstruction technique. COMPARISON:  04/05/2023 FINDINGS: CT HEAD FINDINGS Brain: No evidence of acute infarction, hemorrhage, hydrocephalus, extra-axial collection or mass lesion/mass effect. Periventricular and deep white matter hypodensity. Vascular: No hyperdense vessel or unexpected calcification. Skull: Normal. Negative for fracture or focal lesion. Sinuses/Orbits: No acute finding. Other: None. CT CERVICAL SPINE FINDINGS Alignment: Degenerative straightening of the  normal cervical lordosis. Skull base and vertebrae: No acute fracture. No primary bone lesion or focal pathologic process. Soft tissues and spinal canal: No prevertebral fluid or swelling. No visible canal hematoma. 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 Upper chest: Negative. Other: None. IMPRESSION: 1. No acute intracranial pathology. 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 2. No fracture or static subluxation of the cervical spine. 3. 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 Electronically Signed   By: Jearld Lesch M.D.   On: 09/03/2023 16:03   CT Cervical Spine Wo Contrast  Result Date: 09/03/2023 CLINICAL DATA:  Multiple recent falls, on anticoagulation EXAM: CT HEAD WITHOUT CONTRAST CT CERVICAL SPINE WITHOUT CONTRAST TECHNIQUE: Multidetector CT imaging of the head and cervical spine was performed following the standard protocol without intravenous contrast. Multiplanar CT image reconstructions of the cervical spine were also generated. RADIATION DOSE REDUCTION: This exam was performed according to the departmental dose-optimization program which includes automated exposure control, adjustment of the mA and/or kV according to patient size and/or use of iterative reconstruction technique. COMPARISON:  04/05/2023 FINDINGS: CT HEAD FINDINGS Brain: No evidence of acute infarction, hemorrhage, hydrocephalus, extra-axial collection or mass lesion/mass effect. Periventricular and deep white matter hypodensity. Vascular: No hyperdense vessel or unexpected calcification. Skull: Normal. Negative for fracture or focal lesion. Sinuses/Orbits: No acute finding. Other: None. CT CERVICAL SPINE FINDINGS Alignment: Degenerative straightening of the normal cervical lordosis. Skull base and vertebrae: No acute fracture. No primary bone lesion or focal pathologic process. Soft  tissues and spinal canal: No prevertebral fluid or swelling. No visible canal hematoma. 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 Upper chest: Negative. Other: None. IMPRESSION: 1. No acute intracranial pathology. 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 2. No fracture or static subluxation of the cervical spine. 3. 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 Electronically Signed   By: Jearld Lesch M.D.   On: 09/03/2023 16:03    Procedures Procedures    Medications Ordered in ED Medications  iohexol (OMNIPAQUE) 300 MG/ML solution 75 mL (75 mLs Intravenous Contrast Given 09/03/23 1727)    ED Course/ Medical Decision Making/ A&P                                 Medical Decision Making Amount and/or Complexity of Data Reviewed Labs: ordered. Radiology: ordered. ECG/medicine tests:  ordered.  Risk Prescription drug management.   This patient is a 82 year old male who presents to the ED for concern of fall x 2 in the last 3 days with dizziness and lethargy per patient's daughter.   Differential diagnoses prior to evaluation: The emergent differential diagnosis includes, but is not limited to, muscle strain, fracture, drug-induced dizziness, CVA, arrhythmia, peripheral neuropathy, vestibular dysfunction,. This is not an exhaustive differential.   Past Medical History / Co-morbidities / Social History: Lung cancer, CVA, AF, pacemaker, heart failure with preserved ejection fraction, IBS  Additional history: Chart reviewed. Pertinent results include: Previously seen by neurology for gait abnormality, dizziness however was not able to/is not able to undergo MRI due to pacemaker per neurology.   Lab Tests/Imaging studies: I personally interpreted labs/imaging and the pertinent results include: CT head, cervical spine which showed unremarkable with no  sign of acute injury, x-ray of ribs, pelvis, left leg which showed rib fractures of ribs 9 and 10.  CT of chest abdomen and pelvis with and ordered to evaluate for further bleeding which showed trace pleural effusions and associated atelectasis, left inguinal canal has some attenuation possibly hematoma.  I agree with the radiologist interpretation.  Labs are all within normal limits for him showing no signs of metabolic abnormalities, kidney dysfunction, liver dysfunction, or infection.  Cardiac monitoring: EKG obtained and interpreted by myself and attending physician which shows: Paced with LBBB.    Medications: No medications received this visit.  I have reviewed the patients home medicines and have made adjustments as needed.   ED Course:  Patient is a 82 year old male presents to the ED today complaining of left rib pain, left leg pain, pelvic pain post fall.  He presents with daughter and son-in-law.  They report he has fallen twice in the last 2 days due to instability.  He was previously being seen by neurology for this instability with results of exam being unknown outside of being told that he has hyperreflexia in both his knees.  He is not a candidate for MRI due to pacemaker per neurology.  Patient is on Xarelto for A-fib.  Daughter reports that he is "more lethargic."  Patient also states that he has taken his losartan recently at 25 mg/day.  However he has not been on that for the majority of the year.  On exam tenderness noted over ribs 9 through 12, left pelvis, left femur over greater trochanter.  Patient reports pain nowhere else.  Denies headache, blurry vision,, neck pain, sternal pain, arm pain, back pain, knee pain, ankle pain, wrist pain, elbow pain, shoulder pain.  Also denies nausea, vomiting, diarrhea, incontinence, numbness, tingling.  Patient is alert and oriented and able answer all questions and is active in conversation.  However due to daughter's concerns of lethargic  behavior over the last few days combined with dizziness, warranted CT head for evaluation.  Also due to patient having fallen multiple times CT spine was also added.  X-rays of ribs, femur, pelvis were also added for evaluation.  Baseline labs were also added due to the dizziness and lethargic behavior noted by daughter.  Pacemaker was also interrogated which showed mild atrial flutter.  X-ray showed rib fractures on the left side of 9 and 10.  For this reason CT of chest, abdomen, pelvis were done to evaluate for further bleeding due to patient being on Xarelto.  CT showed soft tissue attenuation in left inguinal canal however upon exam no hematomas noted, no edema noted.  Will discharge with incentive spirometry and follow-up with PCP if need to be reevaluated for rib healing.  Patient is to take ibuprofen for pain control over-the-counter.  Patient was again to follow-up with neurology for dizziness.  Patient was given strict return to ER precautions.  Patient and family understood the plan and expressed agreement.  Patient appears safe to be discharged  Disposition: After consideration of the diagnostic results and the patients response to treatment, I feel that he would benefit from discharge will be treated as above.   emergency department workup does not suggest an emergent condition requiring admission or immediate intervention beyond what has been performed at this time. The plan is: Spirometry, ibuprofen pain control, follow-up with PCP and neurology for further workup. The patient is safe for discharge and has been instructed to return immediately for worsening symptoms, change in symptoms or any other concerns.  Final Clinical Impression(s) / ED Diagnoses Final diagnoses:  Fall, initial encounter    Rx / DC Orders ED Discharge Orders     None       Portions of this report may have been transcribed using voice recognition software. Every effort was made to ensure accuracy; however,  inadvertent computerized transcription errors may be present.  Lunette Stands, PA-C 09/03/23 1956    Lavonia Drafts 09/03/23 2159    Tegeler, Canary Brim, MD 09/03/23 2242

## 2023-09-03 NOTE — ED Triage Notes (Signed)
Larey Seat today while leaving Sunday school. Landed on chair. Left flank pain. Also fell twice yesterday while in [place]. Yesterdays fall was while on an escalator. Right knee abrasion. Denies head injury. Karma Greaser friend reports coloring was not good when he fell today. This was reported by bystanders.  Patient's losartan had been discontinued due to blood pressures being low. Patient states he takes the medication if his blood pressure readings are 140/80.  States, "I like my blood pressure 118/60.  Took blood pressure meds once yesterday morning and this morning. Alert. Has some memory issues.

## 2023-09-03 NOTE — ED Notes (Signed)
Patient is being discharged from the Urgent Care and sent to the Emergency Department via pov . Per M. Stanhope, NP, patient is in need of higher level of care due to frequent falls and dizziness. Patient is aware and verbalizes understanding of plan of care.  Vitals:   09/03/23 1254  BP: 130/83  Pulse: 60  Resp: 18  Temp: 98.3 F (36.8 C)  SpO2: 95%

## 2023-09-03 NOTE — ED Notes (Signed)
RT instructed patient on how to use IS. Pretty good effort considering pain level. Stated he didn't have questions.

## 2023-09-03 NOTE — ED Triage Notes (Addendum)
Pt referred here from UC re: multiple recent falls; c/o pain to LT side (trunk), LT ribs and LT buttock; takes Xarelto, denies hitting head

## 2023-09-03 NOTE — ED Notes (Signed)
Fall risk band and socks applied.

## 2023-09-03 NOTE — ED Provider Notes (Signed)
Patient presents to urgent care with his daughter who contributes to the history for evaluation after recent falls. He fell yesterday while he was stepping off of an escalator yesterday resulting in abrasion to the right knee. He was able to get back up and walk after fall yesterday. This morning, he stood up to walk out of Sunday school, became dizzy, and states he suddenly felt like he was falling.  He landed on his buttocks and left rib cage this morning during fall.  He did not hit his head.  Witnesses state he did not pass out, however patient experienced near syncope. Daughter states he has been more sleepy this morning than usual. Denies recent urinary symptoms, vision changes, unilateral extremity weakness, paresthesias, and recent medication changes. States he has been on losartan for many years and wonders if his blood pressure may have "bottomed out". He had a pacemaker placed in November 2024. Denies chest pain and shortness of breath preceding dizziness.  No recent nausea or vomiting.  Seated in comfortable position in wheelchair, alert and oriented x4. No bruising to the left ribcage, although tender to palpation. No signs of injury on exam. Neurologically intact to baseline currently.  Recommend transfer to ED due to near syncope. Question UTI, dehydration, hypotension etiology.   Discussed recommendations and risks of deferring ED visit with patient who expresses understanding and agreement with plan to go to closest ER for further workup.  Discharged in stable condition to the ER via personal vehicle.     Carlisle Beers, [place] 09/03/23 1351

## 2023-09-03 NOTE — Discharge Instructions (Addendum)
You were seen today for a fall.  He suffered fractures of the ninth and 10th rib on your left side.  This seems to be uncomplicated and nondisplaced.  No signs of bleeding.  However it is recommended that you wear compression shorts for limiting the development of bruising and swelling.  Use the incentive spirometry as instructed.  Use 10 times every 2-4 hours every day until ribs are healed.  You can take Tylenol for pain. Take Tylenol (acetominophen)  500 mg every 4-6 hours, as needed for pain or fever. Do not take more than 4,000 mg in a 24-hour period. As this may cause liver damage. While this is rare, if you begin to develop yellowing of the skin or eyes, stop taking and return to ER immediately.  Follow-up with PCP for rib fracture reevaluation in a couple weeks as needed.  Return to the ER if experiencing worsening chest pain, shortness of breath, abdominal pain, worsening swelling, worsening bruising, fatigue, blood in urine or stool.  It was a pleasure seeing you here in the ER!

## 2023-09-04 ENCOUNTER — Ambulatory Visit: Payer: Medicare Other | Admitting: Cardiology

## 2023-09-04 ENCOUNTER — Other Ambulatory Visit: Payer: Self-pay

## 2023-09-04 ENCOUNTER — Inpatient Hospital Stay (HOSPITAL_COMMUNITY)
Admission: EM | Admit: 2023-09-04 | Discharge: 2023-09-07 | DRG: 312 | Disposition: A | Discharge status: Skilled Nursing Facility | Payer: Medicare Other | Attending: Internal Medicine | Admitting: Internal Medicine

## 2023-09-04 ENCOUNTER — Telehealth: Payer: Self-pay | Admitting: Neurology

## 2023-09-04 DIAGNOSIS — S2242XA Multiple fractures of ribs, left side, initial encounter for closed fracture: Secondary | ICD-10-CM | POA: Diagnosis present

## 2023-09-04 DIAGNOSIS — R001 Bradycardia, unspecified: Secondary | ICD-10-CM | POA: Diagnosis present

## 2023-09-04 DIAGNOSIS — I739 Peripheral vascular disease, unspecified: Secondary | ICD-10-CM | POA: Diagnosis present

## 2023-09-04 DIAGNOSIS — Z808 Family history of malignant neoplasm of other organs or systems: Secondary | ICD-10-CM

## 2023-09-04 DIAGNOSIS — Z87891 Personal history of nicotine dependence: Secondary | ICD-10-CM

## 2023-09-04 DIAGNOSIS — I4892 Unspecified atrial flutter: Secondary | ICD-10-CM | POA: Diagnosis present

## 2023-09-04 DIAGNOSIS — W01190A Fall on same level from slipping, tripping and stumbling with subsequent striking against furniture, initial encounter: Secondary | ICD-10-CM | POA: Diagnosis present

## 2023-09-04 DIAGNOSIS — I872 Venous insufficiency (chronic) (peripheral): Secondary | ICD-10-CM | POA: Diagnosis present

## 2023-09-04 DIAGNOSIS — Z8616 Personal history of COVID-19: Secondary | ICD-10-CM

## 2023-09-04 DIAGNOSIS — R55 Syncope and collapse: Principal | ICD-10-CM | POA: Diagnosis present

## 2023-09-04 DIAGNOSIS — J9811 Atelectasis: Secondary | ICD-10-CM | POA: Diagnosis present

## 2023-09-04 DIAGNOSIS — R2689 Other abnormalities of gait and mobility: Secondary | ICD-10-CM | POA: Diagnosis present

## 2023-09-04 DIAGNOSIS — I700 Atherosclerosis of aorta: Secondary | ICD-10-CM | POA: Diagnosis present

## 2023-09-04 DIAGNOSIS — I130 Hypertensive heart and chronic kidney disease with heart failure and stage 1 through stage 4 chronic kidney disease, or unspecified chronic kidney disease: Secondary | ICD-10-CM | POA: Diagnosis present

## 2023-09-04 DIAGNOSIS — Z85118 Personal history of other malignant neoplasm of bronchus and lung: Secondary | ICD-10-CM

## 2023-09-04 DIAGNOSIS — M79662 Pain in left lower leg: Secondary | ICD-10-CM | POA: Diagnosis present

## 2023-09-04 DIAGNOSIS — M25552 Pain in left hip: Secondary | ICD-10-CM | POA: Diagnosis present

## 2023-09-04 DIAGNOSIS — I4891 Unspecified atrial fibrillation: Secondary | ICD-10-CM | POA: Diagnosis present

## 2023-09-04 DIAGNOSIS — I951 Orthostatic hypotension: Principal | ICD-10-CM | POA: Diagnosis present

## 2023-09-04 DIAGNOSIS — I441 Atrioventricular block, second degree: Secondary | ICD-10-CM | POA: Diagnosis present

## 2023-09-04 DIAGNOSIS — Z8673 Personal history of transient ischemic attack (TIA), and cerebral infarction without residual deficits: Secondary | ICD-10-CM

## 2023-09-04 DIAGNOSIS — Y9222 Religious institution as the place of occurrence of the external cause: Secondary | ICD-10-CM

## 2023-09-04 DIAGNOSIS — Z85828 Personal history of other malignant neoplasm of skin: Secondary | ICD-10-CM

## 2023-09-04 DIAGNOSIS — Z7901 Long term (current) use of anticoagulants: Secondary | ICD-10-CM

## 2023-09-04 DIAGNOSIS — E860 Dehydration: Secondary | ICD-10-CM | POA: Diagnosis present

## 2023-09-04 DIAGNOSIS — H8110 Benign paroxysmal vertigo, unspecified ear: Secondary | ICD-10-CM | POA: Diagnosis present

## 2023-09-04 DIAGNOSIS — I2510 Atherosclerotic heart disease of native coronary artery without angina pectoris: Secondary | ICD-10-CM | POA: Diagnosis present

## 2023-09-04 DIAGNOSIS — Z66 Do not resuscitate: Secondary | ICD-10-CM | POA: Diagnosis present

## 2023-09-04 DIAGNOSIS — I5032 Chronic diastolic (congestive) heart failure: Secondary | ICD-10-CM | POA: Diagnosis present

## 2023-09-04 DIAGNOSIS — Z79899 Other long term (current) drug therapy: Secondary | ICD-10-CM

## 2023-09-04 DIAGNOSIS — I7121 Aneurysm of the ascending aorta, without rupture: Secondary | ICD-10-CM | POA: Diagnosis present

## 2023-09-04 DIAGNOSIS — Z8249 Family history of ischemic heart disease and other diseases of the circulatory system: Secondary | ICD-10-CM

## 2023-09-04 DIAGNOSIS — N179 Acute kidney failure, unspecified: Secondary | ICD-10-CM | POA: Diagnosis present

## 2023-09-04 DIAGNOSIS — R269 Unspecified abnormalities of gait and mobility: Secondary | ICD-10-CM

## 2023-09-04 DIAGNOSIS — E785 Hyperlipidemia, unspecified: Secondary | ICD-10-CM | POA: Diagnosis present

## 2023-09-04 DIAGNOSIS — N1831 Chronic kidney disease, stage 3a: Secondary | ICD-10-CM | POA: Diagnosis present

## 2023-09-04 DIAGNOSIS — D696 Thrombocytopenia, unspecified: Secondary | ICD-10-CM | POA: Diagnosis present

## 2023-09-04 DIAGNOSIS — G3184 Mild cognitive impairment, so stated: Secondary | ICD-10-CM

## 2023-09-04 DIAGNOSIS — R296 Repeated falls: Secondary | ICD-10-CM | POA: Diagnosis present

## 2023-09-04 HISTORY — DX: Syncope and collapse: R55

## 2023-09-04 LAB — CBC WITH DIFFERENTIAL/PLATELET
Abs Immature Granulocytes: 0.03 10*3/uL (ref 0.00–0.07)
Basophils Absolute: 0 10*3/uL (ref 0.0–0.1)
Basophils Relative: 0 %
Eosinophils Absolute: 0 10*3/uL (ref 0.0–0.5)
Eosinophils Relative: 0 %
HCT: 45.4 % (ref 39.0–52.0)
Hemoglobin: 15 g/dL (ref 13.0–17.0)
Immature Granulocytes: 0 %
Lymphocytes Relative: 8 %
Lymphs Abs: 0.7 10*3/uL (ref 0.7–4.0)
MCH: 31.6 pg (ref 26.0–34.0)
MCHC: 33 g/dL (ref 30.0–36.0)
MCV: 95.6 fL (ref 80.0–100.0)
Monocytes Absolute: 0.6 10*3/uL (ref 0.1–1.0)
Monocytes Relative: 7 %
Neutro Abs: 7.8 10*3/uL — ABNORMAL HIGH (ref 1.7–7.7)
Neutrophils Relative %: 85 %
Platelets: 136 10*3/uL — ABNORMAL LOW (ref 150–400)
RBC: 4.75 MIL/uL (ref 4.22–5.81)
RDW: 14.4 % (ref 11.5–15.5)
WBC: 9.2 10*3/uL (ref 4.0–10.5)
nRBC: 0 % (ref 0.0–0.2)

## 2023-09-04 LAB — COMPREHENSIVE METABOLIC PANEL
ALT: 23 U/L (ref 0–44)
AST: 26 U/L (ref 15–41)
Albumin: 3.2 g/dL — ABNORMAL LOW (ref 3.5–5.0)
Alkaline Phosphatase: 48 U/L (ref 38–126)
Anion gap: 10 (ref 5–15)
BUN: 14 mg/dL (ref 8–23)
CO2: 24 mmol/L (ref 22–32)
Calcium: 8.7 mg/dL — ABNORMAL LOW (ref 8.9–10.3)
Chloride: 103 mmol/L (ref 98–111)
Creatinine, Ser: 1.25 mg/dL — ABNORMAL HIGH (ref 0.61–1.24)
GFR, Estimated: 57 mL/min — ABNORMAL LOW (ref >60)
Glucose, Bld: 126 mg/dL — ABNORMAL HIGH (ref 70–99)
Potassium: 4.1 mmol/L (ref 3.5–5.1)
Sodium: 137 mmol/L (ref 135–145)
Total Bilirubin: 1.2 mg/dL — ABNORMAL HIGH (ref <1.2)
Total Protein: 6 g/dL — ABNORMAL LOW (ref 6.5–8.1)

## 2023-09-04 LAB — URINALYSIS, ROUTINE W REFLEX MICROSCOPIC
Bilirubin Urine: NEGATIVE
Glucose, UA: NEGATIVE mg/dL
Hgb urine dipstick: NEGATIVE
Ketones, ur: NEGATIVE mg/dL
Leukocytes,Ua: NEGATIVE
Nitrite: NEGATIVE
Protein, ur: NEGATIVE mg/dL
Specific Gravity, Urine: 1.015 (ref 1.005–1.030)
pH: 6 (ref 5.0–8.0)

## 2023-09-04 LAB — CBG MONITORING, ED: Glucose-Capillary: 104 mg/dL — ABNORMAL HIGH (ref 70–99)

## 2023-09-04 LAB — TROPONIN I (HIGH SENSITIVITY)
Troponin I (High Sensitivity): 8 ng/L (ref <18)
Troponin I (High Sensitivity): 7 ng/L (ref <18)

## 2023-09-04 LAB — MAGNESIUM: Magnesium: 1.9 mg/dL (ref 1.7–2.4)

## 2023-09-04 MED ORDER — SENNOSIDES-DOCUSATE SODIUM 8.6-50 MG PO TABS: 1.0000 | ORAL_TABLET | Freq: Every evening | ORAL | Status: DC | PRN

## 2023-09-04 MED ORDER — ACETAMINOPHEN 325 MG PO TABS: 650.0000 mg | ORAL_TABLET | Freq: Once | ORAL | Status: DC

## 2023-09-04 MED ORDER — ROSUVASTATIN CALCIUM 20 MG PO TABS
40.0000 mg | ORAL_TABLET | Freq: Every day | ORAL | Status: DC
  Administered 2023-09-04 – 2023-09-06 (×3): 40 mg via ORAL
  Filled 2023-09-04 (×3): qty 2

## 2023-09-04 MED ORDER — ACETAMINOPHEN 500 MG PO TABS
1000.0000 mg | ORAL_TABLET | Freq: Once | ORAL | Status: AC
  Administered 2023-09-04: 1000 mg via ORAL
  Filled 2023-09-04: qty 2

## 2023-09-04 MED ORDER — AMIODARONE HCL 200 MG PO TABS
200.0000 mg | ORAL_TABLET | Freq: Every day | ORAL | Status: DC
  Administered 2023-09-05: 200 mg via ORAL
  Filled 2023-09-04: qty 1

## 2023-09-04 MED ORDER — ACETAMINOPHEN 325 MG PO TABS
650.0000 mg | ORAL_TABLET | Freq: Four times a day (QID) | ORAL | Status: DC | PRN
  Administered 2023-09-05 (×2): 650 mg via ORAL
  Filled 2023-09-04 (×2): qty 2

## 2023-09-04 MED ORDER — LIDOCAINE 5 % EX PTCH
2.0000 | MEDICATED_PATCH | CUTANEOUS | Status: DC
  Administered 2023-09-04 – 2023-09-05 (×2): 2 via TRANSDERMAL
  Filled 2023-09-04 (×2): qty 2

## 2023-09-04 MED ORDER — COLESEVELAM HCL 625 MG PO TABS
1250.0000 mg | ORAL_TABLET | Freq: Two times a day (BID) | ORAL | Status: DC
  Administered 2023-09-05 – 2023-09-06 (×2): 1250 mg via ORAL
  Filled 2023-09-04 (×5): qty 2

## 2023-09-04 MED ORDER — ACETAMINOPHEN 650 MG RE SUPP: 650.0000 mg | Freq: Four times a day (QID) | RECTAL | Status: DC | PRN

## 2023-09-04 MED ORDER — RIVAROXABAN 20 MG PO TABS
20.0000 mg | ORAL_TABLET | Freq: Every day | ORAL | Status: DC
  Administered 2023-09-04 – 2023-09-06 (×3): 20 mg via ORAL
  Filled 2023-09-04: qty 2
  Filled 2023-09-04: qty 1
  Filled 2023-09-04: qty 2

## 2023-09-04 NOTE — H&P (Cosign Needed)
Date: 09/04/2023               Patient Name:  Louis Barton MRN: [id number]  DOB: [birth year] Age / Sex: 82 y.o., male   PCP: Gordan Payment., MD         Medical Service: Internal Medicine Teaching Service         Attending Physician: Dr. Rondel Baton, MD      First Contact: Dr. Jeral Pinch, DO Pager [phone]    Second Contact: Dr. Rana Snare, DO Pager [phone]         After Hours (After 5p/  First Contact Pager: [phone]  weekends / holidays): Second Contact Pager: [phone]   SUBJECTIVE   Chief Complaint: "Near Syncope"   History of Present Illness:   This 82 year old male past medical history significant malignant lung cancer, hyperlipidemia, BPPV, symptomatic bradycardia with pacemaker, previous stroke, ascending aortic aneurysm, CKD, PAD, hx of Vit B12 deficiency, carotid enterectomy left (2005) right 2012), A-fib on Xarelto 20 mg, CKD IIIA, osteoarthritis involving multiple joints, HFpEF presented w/ near syncopal episodes.   Patient's daughter and son at bedside.  Patient reports that for the past couple of days he has been having episodes of dizziness and lightheadedness, with episodes of fall.  Reported on Saturday night during dinner, he felt very dizzy and weak.  On Sunday, he stumbled and fell at [employer].  He was seen in the ED 12/8 noted for acute rib fractures.  On Sunday night, during dinner, he felt very weak and lightheaded, appeared pale. He went to bed and felt better afterwards. He woke up this morning, patient's girl friend noted decreased in mentation and appeared slow to respond, daughter came to check on him and noted his BP 60/33.   Patient reports that he regularly checks his blood pressure at home, he noted this but his blood pressure had been elevated since the beginning of last week, systolics at 150s, so he started taking his losartan 50 mg on Thursday, Friday, Saturday.  He also noted that he takes Lasix 20 mg daily, however he took 2 tablets  on Friday after he noticed the ankle had been swelling.  His dry weight is roughly around 159-162 lbs. Reports lightheadedness with positional changes.  Daughter at bedside reports him decreasing p.o. intake; he did not eat as much on Saturday or Sunday.   During these episodes, patient denies any bowel or bladder incontinence. Denies any LOC, abnormal limb movements or tongue biting, slurred speech.  Denies any chest pain, shortness of breath, fever, chills, sick contacts.  Denies any worsening of cough from baseline.  Denies any urinary symptoms.  12/8: Seen in ED s/p fall in church, found to have fractures of left ninth and 10th ribs .   ED Course: Cardiology consulted, recommended pacemaker interrogation.  Meds:  -amiodarone 200 mg  -vitamin D  -colesevelam 1250 mg BID with meals -vitamin B12 injection every month -lasix 20 mg daily -potassium 20 mEq daily  -Xarelto 20 mg daily  -rivastigmine 4.6 mg daily  -rosuvastatin 40 mg daily   Past Medical History -hx of HTN -CHF -atrial flutter s/p pacemaker  -thrombocytopenia  -lung cancer -TIA  Past Medical History:  Diagnosis Date   Abnormal stress test    Aftercare following surgery of the circulatory system, NEC 12/19/2013   Arthritis    DDD- aging    Ascending aortic aneurysm (HCC) 05/15/2020   Atherosclerosis 11/16/2015   Atypical chest pain 06/11/2015   Benign  paroxysmal positional vertigo 08/08/2016   Bradycardia 03/18/2015   CAD (coronary artery disease)    Cancer (HCC)    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 On imaging;   Cramps of left lower extremity-Calf  > right calf 01/01/2015   Degeneration of lumbar intervertebral disc 11/16/2015   Formatting of this  note might be different from the original. signficant DDD present with vacuum effect L4-5 and L5-S1 flat discs and anterior spurring noted   Dizziness 05/06/2020   Dyslipidemia 03/18/2015   Dyspnea on exertion 04/02/2020   Ear itch 05/14/2018   Edema 11/16/2015   Edema of both lower extremities due to peripheral venous insufficiency 02/03/2020   First degree AV block 10/20/2020   Heart failure (HCC)    History of hiatal hernia    seen on last scan- as slight    History of thrombocytopenia    History of TIAs    Hyperlipidemia    Hypertension    Irritable bowel syndrome 11/17/2016   rec trial of citrucel/ diet 11/16/2016 >>>      Local edema 02/07/2018   Malaise and fatigue 12/08/2020   Malignant neoplasm of left lung (HCC) 12/08/2017   Stage 1A. [place]. And Dr. 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 These slowly enlarging 9 mm right lower lobe pulmonary nodule along the right hemidiaphragm does not appear hypermetabolic today. Location adjacent to the hemidiaphragm can cause false negatives due to motion artifact related to the   Sprain of left ankle 05/21/2021    Stage 3a chronic kidney disease (HCC) 06/17/2020   Stroke (HCC)    Swelling of limb-Left foot 01/01/2015   Vitamin B12 deficiency 06/21/2017    Past Surgical History:  Procedure Laterality Date   adenocarcinoma  2019   Removed   CARDIOVERSION N/A 03/22/2021   Procedure: CARDIOVERSION;  Surgeon: Lewayne Bunting, MD;  Location: [facility] ENDOSCOPY;  Service: Cardiovascular;  Laterality: N/A;   CARDIOVERSION N/A 12/15/2021   Procedure: CARDIOVERSION;  Surgeon: Maisie Fus, MD;  Location: [facility] ENDOSCOPY;  Service: Cardiovascular;  Laterality: N/A;   CAROTID ENDARTERECTOMY  2005   Left carotid by Dr. Darrick Penna   CAROTID ENDARTERECTOMY  04/25/11   Right Carotid by Dr. Darrick Penna   COLONOSCOPY  02/23/2017   Colonic polyp status post  polypectomy. Pancolonic diverticulosis predominatly in the sigmoid colon. Tubular adenoma   LEAD REVISION/REPAIR N/A 08/16/2021   Procedure: LEAD REVISION/REPAIR;  Surgeon: Regan Lemming, MD;  Location: [facility] INVASIVE CV LAB;  Service: Cardiovascular;  Laterality: N/A;   LEFT HEART CATH AND CORONARY ANGIOGRAPHY N/A 02/08/2021   Procedure: LEFT HEART CATH AND CORONARY ANGIOGRAPHY;  Surgeon: Kathleene Hazel, MD;  Location: [facility] INVASIVE CV LAB;  Service: Cardiovascular;  Laterality: N/A;   PACEMAKER IMPLANT N/A 07/28/2021   Procedure: PACEMAKER IMPLANT;  Surgeon: Regan Lemming, MD;  Location: [facility] INVASIVE CV LAB;  Service: Cardiovascular;  Laterality: N/A;   Septoplasty, nasal w/ submucosal resection  1960   SQUAMOUS CELL CARCINOMA EXCISION     pt said numerous times   TEE WITHOUT CARDIOVERSION N/A 03/22/2021   Procedure: TRANSESOPHAGEAL ECHOCARDIOGRAM (TEE);  Surgeon: Lewayne Bunting, MD;  Location: [facility] ENDOSCOPY;  Service: Cardiovascular;  Laterality: N/A;   TONSILLECTOMY     VIDEO ASSISTED THORACOSCOPY (VATS)/WEDGE RESECTION Right 11/09/2017   Procedure: RIGHT VIDEO ASSISTED THORACOSCOPY WITH Sabino Snipes RESECTION;  Surgeon: Loreli Slot, MD;  Location:  [facility] OR;  Service: Thoracic;  Laterality: Right;   Social:  Lives With: alone  Support: son and daughter  Level of Function: independent with ADL and IADL PCP: Gordan Payment., MD Substances: -Tobacco: former until 82yo -Alcohol: scotch a night until stopped in April  -Recreational Drug: denies   Family History:  -father: CHF -mother: breast cancer  Family History  Problem Relation Age of Onset   Cancer Mother 43       Breast   Stroke Mother    Hyperlipidemia Mother    Other Mother        varicose veins   Heart attack Mother    Heart disease Mother        Left ankle swelling   Hypertension Mother    Deep vein thrombosis Mother    Varicose Veins Mother    Heart attack Father    Other Sister        Tumor in Lung and Brain   Cancer Sister        Tumor   Lung  and  Brain   Cancer Brother        BCC-SCC-Merkle Cell   Colon cancer Neg Hx    Esophageal cancer Neg Hx    Rectal cancer Neg Hx    Stomach cancer Neg Hx    Allergies: Allergies as of 09/04/2023   (No Known Allergies)    Review of Systems: A complete ROS was negative except as per HPI.   OBJECTIVE:   Physical Exam: Blood pressure 132/76, pulse 61, temperature 97.7 F (36.5 C), resp. rate 18, SpO2 100%.   Constitutional: Laying in bed, no acute distress HENT: normocephalic atraumatic, mucous membranes moist Cardiovascular: regular rate and rhythm, no m/r/g, 1+ pitting edema Left distal LE Pulmonary/Chest: normal work of breathing on room air, lungs clear to auscultation bilaterally Abdominal: soft, non-tender, non-distended, bowel sounds present MSK: +Left lower ribs tenderness +left hip pain  Neurological: alert & oriented x 3, 5/5 strength in bilateral upper and lower extremities Skin: warm and dry Psych: normal mood and affect  Labs: CBC    Component Value Date/Time   WBC 9.2 09/04/2023 1152   RBC 4.75 09/04/2023 1152   HGB 15.0 09/04/2023 1152   HGB 14.3 04/19/2023 0934   HGB 14.4  08/13/2021 1611   HCT 45.4 09/04/2023 1152   HCT 44.1 08/13/2021 1611   PLT  136 (L) 09/04/2023 1152   PLT 148 (L) 04/19/2023 0934   PLT 158 08/13/2021 1611   MCV 95.6 09/04/2023 1152   MCV 93 08/13/2021 1611   MCH 31.6 09/04/2023 1152   MCHC 33.0 09/04/2023 1152   RDW 14.4 09/04/2023 1152   RDW 12.9 08/13/2021 1611   LYMPHSABS 0.7 09/04/2023 1152   MONOABS 0.6 09/04/2023 1152   EOSABS 0.0 09/04/2023 1152   BASOSABS 0.0 09/04/2023 1152     CMP     Component Value Date/Time   NA 137 09/04/2023 1152   NA 141 03/15/2023 1000   K 4.1 09/04/2023 1152   CL 103 09/04/2023 1152   CO2 24 09/04/2023 1152   GLUCOSE 126 (H) 09/04/2023 1152   BUN 14 09/04/2023 1152   BUN 14 03/15/2023 1000   CREATININE 1.25 (H) 09/04/2023 1152   CREATININE 0.92 04/19/2023 0934   CALCIUM 8.7 (L) 09/04/2023 1152   PROT 6.0 (L) 09/04/2023 1152   PROT 6.7 11/21/2018 0836   ALBUMIN 3.2 (L) 09/04/2023 1152   ALBUMIN 4.0 11/21/2018 0836   AST 26 09/04/2023 1152   AST 28 04/19/2023 0934   ALT 23 09/04/2023 1152   ALT 28 04/19/2023 0934   ALKPHOS 48 09/04/2023 1152   BILITOT 1.2 (H) 09/04/2023 1152   BILITOT 0.9 04/19/2023 0934   GFRNONAA 57 (L) 09/04/2023 1152   GFRNONAA >60 04/19/2023 0934   GFRAA 89 04/02/2020 0959    Imaging:  CT head/cervical without con IMPRESSION: 1. No acute intracranial pathology. 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 2. No fracture or static subluxation of the cervical spine. 3. 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  DG Ribs IMPRESSION: 1. Acute fractures of the anterior aspects of the left ninth and tenth ribs. 2. Cardiac enlargement and aortic atherosclerosis. 3. Postoperative changes within the right lower lung with surrounding pleural thickening and scarring.  DG Pelvis IMPRESSION: 1. No acute findings. 2. Lower lumbar degenerative disc disease.   DG Left  Femur FINDINGS: There is no evidence of fracture or other focal bone lesions. Soft tissues are unremarkable. Patchy arterial calcifications.  CT chest/abd/pelvis IMPRESSION: 1. Acute fractures of the left ninth and tenth ribs. No pneumothorax. 2. Small right and trace left pleural effusions with associated atelectasis. 3. Soft tissue attenuation in the left inguinal canal has developed since prior exam. This may represent a hematoma given the history of trauma.    EKG: personally reviewed my interpretation is artifacts, normal rate with paced rhythm.   ASSESSMENT & PLAN:   Assessment & Plan by Problem: Active Problems:   * No active hospital problems. *   Louis Barton is a 82 y.o. 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   #Near Syncope #Hx of A flutter s/p pacemaker #Hx BPPV Presented with two days of dizziness, lightheadedness, near syncope, falls. No LOC, bowel or bladder incontinence, abnormal limb movements, tongue biting. He took losartan 50 mg on Thursday, Friday, Saturday after noting elevated blood pressure readings beginning of last week. He also took an x-ray dose of Lasix on Friday after noting he had ankle swelling. Also endorsing lightheadedness and dizziness with position  changes, decreased p.o. intake for the past couple of days. He has history of symptomatic bradycardia with pacemaker, follows cardiology. Labs reassuring with no electrolyte abnormalities, some elevation in Cr 1.25 [0.96], normal WBC and Hgb. Normal UA. Etiology multifactorial, he symptoms likely precipitated with restarting his losartan 50 mg,  extra dose of Lasix, decreased p.o. intake resulting in near-syncope. Also considering arrhythmia that can also precipitate his symptoms with underlying a flutter. Cardiology consulted.   Plan: - Follow up cardiology recommendation - Pacemaker interrogation  - Orthostatic vitals - Continue home medication Xarelto 20 mg daily - Continue home medication amiodarone 200 mg daily - PT eval tomorrow   #Fall #Rib Fractures, Ribs 10-11th Had a fall 12/8 at church after he stumbled.  Imaging showed acute fractures of the anterior aspect of the left ninth and 10th rib. He endorsed left lower ribs pain and left hip pain.  - Lidoderm patches  - Tylenol 650 mg every 6 hours as needed for pain  Chronic conditions HLD: Continue home medication Crestor 40 mg and Colesevelam 1,250 mg BID  HFpEF: Does not appear volume overload at this time, has 1+ LLE pitting edema that is at baseline.  HTN: Losartan stopped last year per patient due to episodic dizziness.   Diet: Heart Healthy VTE:  Xarelto  IVF: None,None Code: DNR/DNI  Prior to Admission Living Arrangement: Home, living self Anticipated Discharge Location: Home Barriers to Discharge: medical management   Dispo: Admit patient to Inpatient with expected length of stay greater than 2 midnights.  Signed: Jeral Pinch, DO Internal Medicine Resident PGY-1  09/04/2023, 4:30 PM

## 2023-09-04 NOTE — ED Notes (Signed)
When the pt sat up for ortho stat vitals, his oxygen level dropped to 83% and continued in mid 80's while standing but returned to 98% once laying back in bed

## 2023-09-04 NOTE — Telephone Encounter (Signed)
Please call patient, x-ray of cervical spine showed significant degenerative disease  Moderate narrowing of interspaces C4-C7 with endplate spurring as before.  I have ordered CT myelogram for this patient  Orders Placed This Encounter  Procedures   CT CERVICAL SPINE W CONTRAST   CT THORACIC SPINE W CONTRAST   CT LUMBAR SPINE W CONTRAST   Ambulatory referral to Interventional Radiology

## 2023-09-04 NOTE — ED Provider Notes (Signed)
Care assumed from previous provider.  See note for full HPI.  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  He states he has had multiple falls however he has chronic dizziness followed by neurology.  He was seen yesterday after a fall.  He denied that he had a syncopal episode yesterday however states he fell and hit his ribs.  States he has multiple near syncopal episodes.  Today had a syncopal episode and was noted to have BP of 60/33.  Feeling very weak.  He has some left-sided rib pain from fractures from when he had fallen 2 days prior to his ED visit yesterday.  Cardiology interrogated pacemaker yesterday as well as today which showed intermittent flutter/fib.  Cardiology, Dr. Wyline Mood was consulted by previous provider who thought likely was not cardiac however will consult, previous provider had plans to discuss with medicine for admission Physical Exam  BP 132/76   Pulse 61   Temp 98.3 F (36.8 C) (Oral)   Resp 18   SpO2 100%   Physical Exam Vitals and nursing note reviewed.  Constitutional:      General: He is not in acute distress.    Appearance: He is well-developed. He is not ill-appearing or diaphoretic.  HENT:     Head: Atraumatic.  Eyes:     Pupils: Pupils are equal, round, and reactive to light.  Cardiovascular:     Rate and Rhythm: Normal rate.     Comments: Paced rhythm  Pulmonary:     Effort: Pulmonary effort is normal. No respiratory distress.  Abdominal:     General: There is no distension.     Palpations: Abdomen is soft.  Musculoskeletal:        General: Normal range of motion.     Cervical back: Normal range of motion and neck supple.  Skin:    General: Skin is warm and dry.  Neurological:     General: No focal deficit present.     Mental Status: He is alert and oriented  to person, place, and time.     Procedures  Procedures Labs Reviewed  CBC WITH DIFFERENTIAL/PLATELET - Abnormal; Notable for the following components:      Result Value   Platelets 136 (*)    Neutro Abs 7.8 (*)    All other components within normal limits  COMPREHENSIVE METABOLIC PANEL - Abnormal; Notable for the following components:   Glucose, Bld 126 (*)    Creatinine, Ser 1.25 (*)    Calcium 8.7 (*)    Total Protein 6.0 (*)    Albumin 3.2 (*)    Total Bilirubin 1.2 (*)    GFR, Estimated 57 (*)    All other components within normal limits  CBG MONITORING, ED - Abnormal; Notable for the following components:   Glucose-Capillary 104 (*)    All other components within normal limits  MAGNESIUM  URINALYSIS, ROUTINE W REFLEX MICROSCOPIC  BASIC METABOLIC PANEL  CBC  TROPONIN I (HIGH SENSITIVITY)  TROPONIN I (HIGH SENSITIVITY)   CT CHEST ABDOMEN PELVIS W CONTRAST  Result Date: 09/03/2023 CLINICAL DATA:  Fall from standing with pain. EXAM: CT CHEST, ABDOMEN, AND PELVIS WITH CONTRAST TECHNIQUE: Multidetector CT imaging of the chest, abdomen and pelvis was performed following the standard protocol during bolus administration of intravenous contrast. RADIATION DOSE REDUCTION: This exam was performed according to the departmental  dose-optimization program which includes automated exposure control, adjustment of the mA and/or kV according to patient size and/or use of iterative reconstruction technique. CONTRAST:  75mL OMNIPAQUE IOHEXOL 300 MG/ML  SOLN COMPARISON:  CT chest, abdomen, and pelvis dated 10/20/2022. FINDINGS: CT CHEST FINDINGS Cardiovascular: Vascular calcifications are seen in the coronary arteries and aortic arch. The heart is enlarged. No pericardial effusion. A left subclavian cardiac device is redemonstrated. Mediastinum/Nodes: No enlarged mediastinal, hilar, or axillary lymph nodes. Thyroid gland, trachea, and esophagus demonstrate no significant findings. Lungs/Pleura:  Postoperative changes and round atelectasis/scarring are redemonstrated in the right lower lung. There is a small right pleural effusion with associated atelectasis. A there is a trace left pleural effusion with associated atelectasis. No pneumothorax on either side. Musculoskeletal: There are acute fractures of the left ninth and tenth ribs. Degenerative changes are seen in the spine. CT ABDOMEN PELVIS FINDINGS Hepatobiliary: No focal liver abnormality is seen. No gallstones, gallbladder wall thickening, or biliary dilatation. Pancreas: Unremarkable. No pancreatic ductal dilatation or surrounding inflammatory changes. Spleen: Normal in size without focal abnormality. Adrenals/Urinary Tract: Adrenal glands are unremarkable. Bilateral renal cysts measure up to 2.0 cm on the right and 4.4 cm on the left. No imaging follow-up is recommended for this finding. No renal calculi or hydronephrosis on either side. Bladder is unremarkable. Stomach/Bowel: Stomach is within normal limits. A lipoma in the duodenum is redemonstrated. The appendix is not definitely identified and is likely surgically absent. There is colonic diverticulosis without evidence of diverticulitis. No evidence of bowel wall thickening, distention, or inflammatory changes. Vascular/Lymphatic: Aortic atherosclerosis. No enlarged abdominal or pelvic lymph nodes. Reproductive: Prostate is unremarkable. Other: There is soft tissue attenuation in the left inguinal canal which has developed since prior exam. Musculoskeletal: Degenerative changes are seen in the spine. IMPRESSION: 1. Acute fractures of the left ninth and tenth ribs. No pneumothorax. 2. Small right and trace left pleural effusions with associated atelectasis. 3. Soft tissue attenuation in the left inguinal canal has developed since prior exam. This may represent a hematoma given the history of trauma. Aortic Atherosclerosis (ICD10-I70.0). Electronically Signed   By: Romona Curls M.D.   On:  09/03/2023 18:11   DG Ribs Unilateral W/Chest Left  Result Date: 09/03/2023 CLINICAL DATA:  Left lower rib pain.  Multiple recent falls. EXAM: LEFT RIBS AND CHEST - 3+ VIEW COMPARISON:  CT 10/20/2022. FINDINGS: Acute, nondisplaced fracture of the anterior aspect of the ninth rib. Slightly angulated fracture involving the anterior aspect of the left tenth rib. Left chest wall pacer device is noted with leads in the right atrial appendage and right ventricle. Cardiac enlargement and aortic atherosclerosis. Postoperative changes noted within the right lower lung with surrounding parenchymal scarring, architectural distortion and pleural thickening. This appears similar to chest CT from 10/20/2022. IMPRESSION: 1. Acute fractures of the anterior aspects of the left ninth and tenth ribs. 2. Cardiac enlargement and aortic atherosclerosis. 3. Postoperative changes within the right lower lung with surrounding pleural thickening and scarring. Electronically Signed   By: Signa Kell M.D.   On: 09/03/2023 16:22   DG Femur 1 View Left  Result Date: 09/03/2023 CLINICAL DATA:  Left-sided pain post recent falls EXAM: LEFT FEMUR 1 VIEW COMPARISON:  None Available. FINDINGS: There is no evidence of fracture or other focal bone lesions. Soft tissues are unremarkable. Patchy arterial calcifications. IMPRESSION: Negative. Electronically Signed   By: Corlis Leak M.D.   On: 09/03/2023 16:20   DG Pelvis 1-2 Views  Result Date: 09/03/2023  CLINICAL DATA:  Recent falls, left-sided pain EXAM: PELVIS - 1-2 VIEW COMPARISON:  CT 10/20/2022 FINDINGS: There is no evidence of pelvic fracture or diastasis. No pelvic bone lesions are seen. Degenerative disc disease in the lower lumbar spine. IMPRESSION: 1. No acute findings. 2. Lower lumbar degenerative disc disease. Electronically Signed   By: Corlis Leak M.D.   On: 09/03/2023 16:20   CT Head Wo Contrast  Result Date: 09/03/2023 CLINICAL DATA:  Multiple recent falls, on  anticoagulation EXAM: CT HEAD WITHOUT CONTRAST CT CERVICAL SPINE WITHOUT CONTRAST TECHNIQUE: Multidetector CT imaging of the head and cervical spine was performed following the standard protocol without intravenous contrast. Multiplanar CT image reconstructions of the cervical spine were also generated. RADIATION DOSE REDUCTION: This exam was performed according to the departmental dose-optimization program which includes automated exposure control, adjustment of the mA and/or kV according to patient size and/or use of iterative reconstruction technique. COMPARISON:  04/05/2023 FINDINGS: CT HEAD FINDINGS Brain: No evidence of acute infarction, hemorrhage, hydrocephalus, extra-axial collection or mass lesion/mass effect. Periventricular and deep white matter hypodensity. Vascular: No hyperdense vessel or unexpected calcification. Skull: Normal. Negative for fracture or focal lesion. Sinuses/Orbits: No acute finding. Other: None. CT CERVICAL SPINE FINDINGS Alignment: Degenerative straightening of the normal cervical lordosis. Skull base and vertebrae: No acute fracture. No primary bone lesion or focal pathologic process. Soft tissues and spinal canal: No prevertebral fluid or swelling. No visible canal hematoma. 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 Upper chest: Negative. Other: None. IMPRESSION: 1. No acute intracranial pathology. 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 2. No fracture or static subluxation of the cervical spine. 3. 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 Electronically Signed   By: Jearld Lesch M.D.   On: 09/03/2023 16:03   CT Cervical Spine Wo Contrast  Result Date: 09/03/2023 CLINICAL DATA:  Multiple recent falls, on anticoagulation EXAM: CT HEAD WITHOUT CONTRAST CT CERVICAL SPINE WITHOUT CONTRAST TECHNIQUE: Multidetector CT imaging of the head  and cervical spine was performed following the standard protocol without intravenous contrast. Multiplanar CT image reconstructions of the cervical spine were also generated. RADIATION DOSE REDUCTION: This exam was performed according to the departmental dose-optimization program which includes automated exposure control, adjustment of the mA and/or kV according to patient size and/or use of iterative reconstruction technique. COMPARISON:  04/05/2023 FINDINGS: CT HEAD FINDINGS Brain: No evidence of acute infarction, hemorrhage, hydrocephalus, extra-axial collection or mass lesion/mass effect. Periventricular and deep white matter hypodensity. Vascular: No hyperdense vessel or unexpected calcification. Skull: Normal. Negative for fracture or focal lesion. Sinuses/Orbits: No acute finding. Other: None. CT CERVICAL SPINE FINDINGS Alignment: Degenerative straightening of the normal cervical lordosis. Skull base and vertebrae: No acute fracture. No primary bone lesion or focal pathologic process. Soft tissues and spinal canal: No prevertebral fluid or swelling. No visible canal hematoma. 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 Upper chest: Negative. Other: None. IMPRESSION: 1. No acute intracranial pathology. 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 2. No fracture or static subluxation of the cervical spine. 3. 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 Electronically Signed   By: Jearld Lesch M.D.   On: 09/03/2023 16:03   DG Cervical Spine 2 or 3 views  Result Date: 09/03/2023 CLINICAL DATA:  gait abnormality, instability, mild cognitive impairment  EXAM: CERVICAL SPINE - 2-3 VIEW COMPARISON:  CT 05/04/2021 FINDINGS: No fracture or dislocation. No prevertebral soft tissue swelling. Moderate narrowing of interspaces C4-C7 with endplate spurring as before. Right neck surgical  clips. Transvenous pacing leads partially visualized. IMPRESSION: 1. No acute findings. 2. Moderate C4-C7 degenerative disc disease. Electronically Signed   By: Corlis Leak M.D.   On: 09/03/2023 09:14    ED Course / MDM   Clinical Course as of 09/04/23 1820  Mon Sep 04, 2023  1545 IM teaching to see for admission [BH]    Clinical Course User Index [BH] Louis Barton A, PA-C    Care assumed from previous provider.  See note for full HPI.  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  He states he has had multiple falls however he has chronic dizziness followed by neurology.  He was seen yesterday after a fall.  He denied that he had a syncopal episode yesterday however states he fell and hit his ribs.  States he has multiple near syncopal episodes.  Today had a syncopal episode and was noted to have BP of 60/33.  Feeling very weak.  He has some left-sided rib pain from fractures from when he had fallen 2 days prior to his ED visit yesterday.  Cardiology interrogated pacemaker yesterday as well as today which showed intermittent flutter/fib.  Cardiology, Dr. Wyline Mood was consulted by previous provider who thought likely was not cardiac however will consult, previous provider had plans to discuss with medicine for admission  Consult with IM teaching service who is agreeable to evaluate patient for admission  The patient appears reasonably stabilized for admission considering the current resources, flow, and capabilities available in the ED at this time, and I doubt any other [facility] requiring further screening and/or treatment in the ED prior to admission.   Medical Decision Making Amount and/or Complexity of Data Reviewed External Data Reviewed: labs, radiology, ECG and notes. Labs: ordered. Decision-making  details documented in ED Course. Radiology: ordered and independent interpretation performed. Decision-making details documented in ED Course. ECG/medicine tests: ordered and independent interpretation performed. Decision-making details documented in ED Course.  Risk OTC drugs. Decision regarding hospitalization. Diagnosis or treatment significantly limited by social determinants of health.          Linwood Dibbles, PA-C 09/04/23 1820    Rondel Baton, MD 09/06/23 1051

## 2023-09-04 NOTE — ED Triage Notes (Signed)
Patient arrived via EMS from girlfriends home. PT had a near syncopal event where he became dizzy, pale, weak, and was unable to raise hands or talk. EMS reports they placed 2 IV and initial bp at his home from daughter was 60/33. Pt has paced rhythm and is on xarelto. Pt had this episode occur yesterday and then today. Pt fell yesterday and was seen at [facility] and left rib fractures. CBG 108. EMS reported a systolic of 108 en route. Pt does not feel like his pacer has been working right

## 2023-09-04 NOTE — ED Provider Notes (Signed)
[place] EMERGENCY DEPARTMENT AT [facility] Provider Note   CSN: [id number] Arrival date & time: 09/04/23  1131     History  Chief Complaint  Patient presents with   Near Syncope    Louis Barton is a 82 y.o. male with past medical history significant for malignant lung cancer, hyperlipidemia, BPPV, symptomatic bradycardia with pacemaker, previous stroke, ascending aortic aneurysm, CKD, peripheral artery disease, A-fib on Xarelto who presents with concern for near syncopal episode becoming dizzy, pale, weak unable to raise hands or talk earlier today.  Initial blood pressure on EMS arrival was 60/33.  Patient had similar episode occurred yesterday and then fell, had some left sided rib fractures from yesterday.  He was supposed to meet with the cardiologist today to discuss possible pacemaker abnormalities, he denies any chest pain, recent fever, chills.   Near Syncope       Home Medications Prior to Admission medications   Medication Sig Start Date End Date Taking? Authorizing Provider  acetaminophen (TYLENOL) 500 MG tablet Take 1,000 mg by mouth every 8 (eight) hours as needed for mild pain or moderate pain (for pain.).    [provider]  amiodarone (PACERONE) 200 MG tablet Take 0.5 tablets (100 mg total) by mouth daily. 04/25/23   Camnitz, Andree Coss, MD  Cholecalciferol (VITAMIN D3) 50 MCG (2000 UT) TABS Take 4,000 Units by mouth every other day.    [provider]  colesevelam (WELCHOL) 625 MG tablet Take 1,250 mg by mouth 2 (two) times daily with a meal.    [provider]  Cyanocobalamin (VITAMIN B-12 IJ) Inject 1 each as directed every 30 (thirty) days.    [provider]  furosemide (LASIX) 20 MG tablet Take 20 mg by mouth daily.    [provider]  Polyethyl Glycol-Propyl Glycol (SYSTANE ULTRA OP) Place 1 drop into both eyes daily. Unknown strength    [provider]  rivaroxaban (XARELTO) 20 MG TABS  tablet Take 1 tablet (20 mg total) by mouth daily with supper. 06/05/23   Georgeanna Lea, MD  rivastigmine (EXELON) 4.6 mg/24hr Place 4.6 mg onto the skin daily.    [provider]  rosuvastatin (CRESTOR) 40 MG tablet Take 1 tablet (40 mg total) by mouth at bedtime. 05/13/21   Zigmund Daniel., MD      Allergies    Patient has no known allergies.    Review of Systems   Review of Systems  Cardiovascular:  Positive for near-syncope.  All other systems reviewed and are negative.   Physical Exam Updated Vital Signs BP 117/63   Pulse 61   Temp 97.7 F (36.5 C)   Resp 19   SpO2 100%  Physical Exam Vitals and nursing note reviewed.  Constitutional:      General: He is not in acute distress.    Appearance: Normal appearance.  HENT:     Head: Normocephalic and atraumatic.  Eyes:     General:        Right eye: No discharge.        Left eye: No discharge.  Cardiovascular:     Rate and Rhythm: Regular rhythm. Bradycardia present.     Heart sounds: No murmur heard.    No friction rub. No gallop.  Pulmonary:     Effort: Pulmonary effort is normal.     Breath sounds: Normal breath sounds.  Abdominal:     General: Bowel sounds are normal.     Palpations:  Abdomen is soft.  Skin:    General: Skin is warm and dry.     Capillary Refill: Capillary refill takes less than 2 seconds.  Neurological:     Mental Status: He is alert and oriented to person, place, and time.  Psychiatric:        Mood and Affect: Mood normal.        Behavior: Behavior normal.     ED Results / Procedures / Treatments   Labs (all labs ordered are listed, but only abnormal results are displayed) Labs Reviewed  CBC WITH DIFFERENTIAL/PLATELET - Abnormal; Notable for the following components:      Result Value   Platelets 136 (*)    Neutro Abs 7.8 (*)    All other components within normal limits  COMPREHENSIVE METABOLIC PANEL - Abnormal; Notable for the following components:   Glucose, Bld  126 (*)    Creatinine, Ser 1.25 (*)    Calcium 8.7 (*)    Total Protein 6.0 (*)    Albumin 3.2 (*)    Total Bilirubin 1.2 (*)    GFR, Estimated 57 (*)    All other components within normal limits  CBG MONITORING, ED - Abnormal; Notable for the following components:   Glucose-Capillary 104 (*)    All other components within normal limits  MAGNESIUM  URINALYSIS, ROUTINE W REFLEX MICROSCOPIC  TROPONIN I (HIGH SENSITIVITY)  TROPONIN I (HIGH SENSITIVITY)    EKG None  Radiology CT CHEST ABDOMEN PELVIS W CONTRAST  Result Date: 09/03/2023 CLINICAL DATA:  Fall from standing with pain. EXAM: CT CHEST, ABDOMEN, AND PELVIS WITH CONTRAST TECHNIQUE: Multidetector CT imaging of the chest, abdomen and pelvis was performed following the standard protocol during bolus administration of intravenous contrast. RADIATION DOSE REDUCTION: This exam was performed according to the departmental dose-optimization program which includes automated exposure control, adjustment of the mA and/or kV according to patient size and/or use of iterative reconstruction technique. CONTRAST:  75mL OMNIPAQUE IOHEXOL 300 MG/ML  SOLN COMPARISON:  CT chest, abdomen, and pelvis dated 10/20/2022. FINDINGS: CT CHEST FINDINGS Cardiovascular: Vascular calcifications are seen in the coronary arteries and aortic arch. The heart is enlarged. No pericardial effusion. A left subclavian cardiac device is redemonstrated. Mediastinum/Nodes: No enlarged mediastinal, hilar, or axillary lymph nodes. Thyroid gland, trachea, and esophagus demonstrate no significant findings. Lungs/Pleura: Postoperative changes and round atelectasis/scarring are redemonstrated in the right lower lung. There is a small right pleural effusion with associated atelectasis. A there is a trace left pleural effusion with associated atelectasis. No pneumothorax on either side. Musculoskeletal: There are acute fractures of the left ninth and tenth ribs. Degenerative changes are seen  in the spine. CT ABDOMEN PELVIS FINDINGS Hepatobiliary: No focal liver abnormality is seen. No gallstones, gallbladder wall thickening, or biliary dilatation. Pancreas: Unremarkable. No pancreatic ductal dilatation or surrounding inflammatory changes. Spleen: Normal in size without focal abnormality. Adrenals/Urinary Tract: Adrenal glands are unremarkable. Bilateral renal cysts measure up to 2.0 cm on the right and 4.4 cm on the left. No imaging follow-up is recommended for this finding. No renal calculi or hydronephrosis on either side. Bladder is unremarkable. Stomach/Bowel: Stomach is within normal limits. A lipoma in the duodenum is redemonstrated. The appendix is not definitely identified and is likely surgically absent. There is colonic diverticulosis without evidence of diverticulitis. No evidence of bowel wall thickening, distention, or inflammatory changes. Vascular/Lymphatic: Aortic atherosclerosis. No enlarged abdominal or pelvic lymph nodes. Reproductive: Prostate is unremarkable. Other: There is soft tissue attenuation in the  left inguinal canal which has developed since prior exam. Musculoskeletal: Degenerative changes are seen in the spine. IMPRESSION: 1. Acute fractures of the left ninth and tenth ribs. No pneumothorax. 2. Small right and trace left pleural effusions with associated atelectasis. 3. Soft tissue attenuation in the left inguinal canal has developed since prior exam. This may represent a hematoma given the history of trauma. Aortic Atherosclerosis (ICD10-I70.0). Electronically Signed   By: Romona Curls M.D.   On: 09/03/2023 18:11   DG Ribs Unilateral W/Chest Left  Result Date: 09/03/2023 CLINICAL DATA:  Left lower rib pain.  Multiple recent falls. EXAM: LEFT RIBS AND CHEST - 3+ VIEW COMPARISON:  CT 10/20/2022. FINDINGS: Acute, nondisplaced fracture of the anterior aspect of the ninth rib. Slightly angulated fracture involving the anterior aspect of the left tenth rib. Left chest  wall pacer device is noted with leads in the right atrial appendage and right ventricle. Cardiac enlargement and aortic atherosclerosis. Postoperative changes noted within the right lower lung with surrounding parenchymal scarring, architectural distortion and pleural thickening. This appears similar to chest CT from 10/20/2022. IMPRESSION: 1. Acute fractures of the anterior aspects of the left ninth and tenth ribs. 2. Cardiac enlargement and aortic atherosclerosis. 3. Postoperative changes within the right lower lung with surrounding pleural thickening and scarring. Electronically Signed   By: Signa Kell M.D.   On: 09/03/2023 16:22   DG Femur 1 View Left  Result Date: 09/03/2023 CLINICAL DATA:  Left-sided pain post recent falls EXAM: LEFT FEMUR 1 VIEW COMPARISON:  None Available. FINDINGS: There is no evidence of fracture or other focal bone lesions. Soft tissues are unremarkable. Patchy arterial calcifications. IMPRESSION: Negative. Electronically Signed   By: Corlis Leak M.D.   On: 09/03/2023 16:20   DG Pelvis 1-2 Views  Result Date: 09/03/2023 CLINICAL DATA:  Recent falls, left-sided pain EXAM: PELVIS - 1-2 VIEW COMPARISON:  CT 10/20/2022 FINDINGS: There is no evidence of pelvic fracture or diastasis. No pelvic bone lesions are seen. Degenerative disc disease in the lower lumbar spine. IMPRESSION: 1. No acute findings. 2. Lower lumbar degenerative disc disease. Electronically Signed   By: Corlis Leak M.D.   On: 09/03/2023 16:20   CT Head Wo Contrast  Result Date: 09/03/2023 CLINICAL DATA:  Multiple recent falls, on anticoagulation EXAM: CT HEAD WITHOUT CONTRAST CT CERVICAL SPINE WITHOUT CONTRAST TECHNIQUE: Multidetector CT imaging of the head and cervical spine was performed following the standard protocol without intravenous contrast. Multiplanar CT image reconstructions of the cervical spine were also generated. RADIATION DOSE REDUCTION: This exam was performed according to the departmental  dose-optimization program which includes automated exposure control, adjustment of the mA and/or kV according to patient size and/or use of iterative reconstruction technique. COMPARISON:  04/05/2023 FINDINGS: CT HEAD FINDINGS Brain: No evidence of acute infarction, hemorrhage, hydrocephalus, extra-axial collection or mass lesion/mass effect. Periventricular and deep white matter hypodensity. Vascular: No hyperdense vessel or unexpected calcification. Skull: Normal. Negative for fracture or focal lesion. Sinuses/Orbits: No acute finding. Other: None. CT CERVICAL SPINE FINDINGS Alignment: Degenerative straightening of the normal cervical lordosis. Skull base and vertebrae: No acute fracture. No primary bone lesion or focal pathologic process. Soft tissues and spinal canal: No prevertebral fluid or swelling. No visible canal hematoma. 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 Upper chest: Negative. Other: None. IMPRESSION: 1. No acute intracranial pathology. 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 2. No fracture or static subluxation of  the cervical spine. 3. 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 Electronically Signed   By: Jearld Lesch M.D.   On: 09/03/2023 16:03   CT Cervical Spine Wo Contrast  Result Date: 09/03/2023 CLINICAL DATA:  Multiple recent falls, on anticoagulation EXAM: CT HEAD WITHOUT CONTRAST CT CERVICAL SPINE WITHOUT CONTRAST TECHNIQUE: Multidetector CT imaging of the head and cervical spine was performed following the standard protocol without intravenous contrast. Multiplanar CT image reconstructions of the cervical spine were also generated. RADIATION DOSE REDUCTION: This exam was performed according to the departmental dose-optimization program which includes automated exposure control, adjustment of the mA and/or kV according to patient size  and/or use of iterative reconstruction technique. COMPARISON:  04/05/2023 FINDINGS: CT HEAD FINDINGS Brain: No evidence of acute infarction, hemorrhage, hydrocephalus, extra-axial collection or mass lesion/mass effect. Periventricular and deep white matter hypodensity. Vascular: No hyperdense vessel or unexpected calcification. Skull: Normal. Negative for fracture or focal lesion. Sinuses/Orbits: No acute finding. Other: None. CT CERVICAL SPINE FINDINGS Alignment: Degenerative straightening of the normal cervical lordosis. Skull base and vertebrae: No acute fracture. No primary bone lesion or focal pathologic process. Soft tissues and spinal canal: No prevertebral fluid or swelling. No visible canal hematoma. 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 Upper chest: Negative. Other: None. IMPRESSION: 1. No acute intracranial pathology. 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 2. No fracture or static subluxation of the cervical spine. 3. 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 Electronically Signed   By: Jearld Lesch M.D.   On: 09/03/2023 16:03    Procedures Procedures    Medications Ordered in ED Medications - No data to display  ED Course/ Medical Decision Making/ A&P                                 Medical Decision Making Amount and/or Complexity of Data Reviewed Labs: ordered.  This patient is a 82 y.o. male who presents to the ED for concern of syncope / near syncope, this involves an extensive number of treatment options, and is a complaint that carries with it a high risk of complications and morbidity. The emergent differential diagnosis prior to evaluation includes, but is not limited to,  CVA, ACS, arrhythmia, vasovagal syncope, orthostatic hypotension, sepsis, hypoglycemia, electrolyte disturbance, respiratory failure, symptomatic anemia,  dehydration, heat injury, polypharmacy, malignancy, anxiety/panic attack.  . This is not an exhaustive differential.   Past Medical History / Co-morbidities / Social History: malignant lung cancer, hyperlipidemia, BPPV, symptomatic bradycardia with pacemaker, previous stroke, ascending aortic aneurysm, CKD, peripheral artery disease, A-fib on Xarelto  Additional history: Chart reviewed. Pertinent results include: Extensively reviewed lab work, imaging from outpatient cardiology visits, as well as ED visit yesterday for syncope, collapse, he has 2 rib fractures and no evidence of head injury at that time.  Physical Exam: Physical exam performed. The pertinent findings include: Patient is a well-appearing in the emergency department other than some bradycardia, his vital signs have been otherwise stable.  We have not seen any return of the hypotension noted on initial EMS arrival  Lab Tests: I ordered, and personally interpreted labs.  The pertinent results include: CMP with mildly elevated creatinine at 1.25, total bilirubin mildly elevated 1.2, otherwise overall unremarkable, CBC with mild thrombocytopenia, platelets 136, otherwise unremarkable, normal troponin, normal initial  magnesium.   Imaging Studies: Imaging studies from his fall yesterday were reviewed including CT head with no evidence of acute intracranial abnormality, he had some rib fractures as noted in provider note from yesterday.   Cardiac Monitoring:  The patient was maintained on a cardiac monitor.  My attending physician Dr. Suezanne Jacquet viewed and interpreted the cardiac monitored which showed an underlying rhythm of: Paced rhythm, not significantly changed from baseline. I agree with this interpretation.  Consultations Obtained: I requested consultation with the cardiologist, spoke with Dr. Wyline Mood who recommends pacemaker evaluation but thinks that is possible his syncope is noncardiac in origin, no other acute abnormality to  explain his syncope in the ED at this time, vital signs have improved from his initial evaluation, the pacemaker evaluation noticed some under sensing of atrial tachycardia/atrial fibrillation, but did not have any episodes of fibrillation, no other abnormalities noted, no episodes of V-fib, V. tach or other unstable rhythm noted.  Given syncope yesterday which caused a fall, near syncope with hypotension today I do think the patient would benefit from admission.   3:30 PM Care of Louis Barton transferred to [facility] and Dr. Eloise Harman at the end of my shift as the patient will require reassessment once labs/imaging have resulted. Patient presentation, ED course, and plan of care discussed with review of all pertinent labs and imaging. Please see his/her note for further details regarding further ED course and disposition. Plan at time of handoff is admission for syncope/near syncope workup, observation with cardiology to consult. This may be altered or completely changed at the discretion of the oncoming team pending results of further workup.  Final Clinical Impression(s) / ED Diagnoses Final diagnoses:  None    Rx / DC Orders ED Discharge Orders     None         [place] 09/04/23 1530    Lonell Grandchild, MD 09/05/23 [phone]

## 2023-09-05 ENCOUNTER — Encounter (HOSPITAL_COMMUNITY): Payer: Self-pay | Admitting: Internal Medicine

## 2023-09-05 DIAGNOSIS — E785 Hyperlipidemia, unspecified: Secondary | ICD-10-CM | POA: Diagnosis present

## 2023-09-05 DIAGNOSIS — E860 Dehydration: Secondary | ICD-10-CM | POA: Diagnosis not present

## 2023-09-05 DIAGNOSIS — R55 Syncope and collapse: Secondary | ICD-10-CM | POA: Diagnosis present

## 2023-09-05 DIAGNOSIS — M79662 Pain in left lower leg: Secondary | ICD-10-CM | POA: Diagnosis present

## 2023-09-05 DIAGNOSIS — I441 Atrioventricular block, second degree: Secondary | ICD-10-CM | POA: Diagnosis not present

## 2023-09-05 DIAGNOSIS — I872 Venous insufficiency (chronic) (peripheral): Secondary | ICD-10-CM | POA: Diagnosis present

## 2023-09-05 DIAGNOSIS — I951 Orthostatic hypotension: Secondary | ICD-10-CM | POA: Diagnosis not present

## 2023-09-05 DIAGNOSIS — M25552 Pain in left hip: Secondary | ICD-10-CM | POA: Diagnosis present

## 2023-09-05 DIAGNOSIS — Z66 Do not resuscitate: Secondary | ICD-10-CM | POA: Diagnosis not present

## 2023-09-05 DIAGNOSIS — I700 Atherosclerosis of aorta: Secondary | ICD-10-CM | POA: Diagnosis present

## 2023-09-05 DIAGNOSIS — Z7901 Long term (current) use of anticoagulants: Secondary | ICD-10-CM

## 2023-09-05 DIAGNOSIS — D696 Thrombocytopenia, unspecified: Secondary | ICD-10-CM | POA: Diagnosis not present

## 2023-09-05 DIAGNOSIS — H8110 Benign paroxysmal vertigo, unspecified ear: Secondary | ICD-10-CM | POA: Diagnosis present

## 2023-09-05 DIAGNOSIS — S2242XA Multiple fractures of ribs, left side, initial encounter for closed fracture: Secondary | ICD-10-CM

## 2023-09-05 DIAGNOSIS — I130 Hypertensive heart and chronic kidney disease with heart failure and stage 1 through stage 4 chronic kidney disease, or unspecified chronic kidney disease: Secondary | ICD-10-CM | POA: Diagnosis not present

## 2023-09-05 DIAGNOSIS — R2689 Other abnormalities of gait and mobility: Secondary | ICD-10-CM | POA: Diagnosis present

## 2023-09-05 DIAGNOSIS — Z8616 Personal history of COVID-19: Secondary | ICD-10-CM | POA: Diagnosis not present

## 2023-09-05 DIAGNOSIS — W01190A Fall on same level from slipping, tripping and stumbling with subsequent striking against furniture, initial encounter: Secondary | ICD-10-CM | POA: Diagnosis present

## 2023-09-05 DIAGNOSIS — Y9222 Religious institution as the place of occurrence of the external cause: Secondary | ICD-10-CM | POA: Diagnosis not present

## 2023-09-05 DIAGNOSIS — N1831 Chronic kidney disease, stage 3a: Secondary | ICD-10-CM | POA: Diagnosis not present

## 2023-09-05 DIAGNOSIS — I4892 Unspecified atrial flutter: Secondary | ICD-10-CM | POA: Diagnosis not present

## 2023-09-05 DIAGNOSIS — I739 Peripheral vascular disease, unspecified: Secondary | ICD-10-CM | POA: Diagnosis not present

## 2023-09-05 DIAGNOSIS — I5032 Chronic diastolic (congestive) heart failure: Secondary | ICD-10-CM | POA: Diagnosis present

## 2023-09-05 DIAGNOSIS — I7121 Aneurysm of the ascending aorta, without rupture: Secondary | ICD-10-CM | POA: Diagnosis present

## 2023-09-05 DIAGNOSIS — J9811 Atelectasis: Secondary | ICD-10-CM | POA: Diagnosis present

## 2023-09-05 DIAGNOSIS — I4891 Unspecified atrial fibrillation: Secondary | ICD-10-CM | POA: Diagnosis not present

## 2023-09-05 DIAGNOSIS — N179 Acute kidney failure, unspecified: Secondary | ICD-10-CM | POA: Diagnosis present

## 2023-09-05 DIAGNOSIS — R001 Bradycardia, unspecified: Secondary | ICD-10-CM | POA: Diagnosis present

## 2023-09-05 HISTORY — DX: Long term (current) use of anticoagulants: Z79.01

## 2023-09-05 HISTORY — DX: Multiple fractures of ribs, left side, initial encounter for closed fracture: S22.42XA

## 2023-09-05 LAB — BASIC METABOLIC PANEL
Anion gap: 7 (ref 5–15)
BUN: 12 mg/dL (ref 8–23)
CO2: 26 mmol/L (ref 22–32)
Calcium: 8.3 mg/dL — ABNORMAL LOW (ref 8.9–10.3)
Chloride: 106 mmol/L (ref 98–111)
Creatinine, Ser: 1.02 mg/dL (ref 0.61–1.24)
GFR, Estimated: >60 mL/min (ref >60)
Glucose, Bld: 97 mg/dL (ref 70–99)
Potassium: 4.2 mmol/L (ref 3.5–5.1)
Sodium: 139 mmol/L (ref 135–145)

## 2023-09-05 LAB — CBC
HCT: 42.1 % (ref 39.0–52.0)
Hemoglobin: 13.5 g/dL (ref 13.0–17.0)
MCH: 30.8 pg (ref 26.0–34.0)
MCHC: 32.1 g/dL (ref 30.0–36.0)
MCV: 95.9 fL (ref 80.0–100.0)
Platelets: 118 10*3/uL — ABNORMAL LOW (ref 150–400)
RBC: 4.39 MIL/uL (ref 4.22–5.81)
RDW: 14.2 % (ref 11.5–15.5)
WBC: 8.2 10*3/uL (ref 4.0–10.5)
nRBC: 0 % (ref 0.0–0.2)

## 2023-09-05 MED ORDER — ACETAMINOPHEN 500 MG PO TABS
1000.0000 mg | ORAL_TABLET | Freq: Three times a day (TID) | ORAL | Status: DC
  Administered 2023-09-05 – 2023-09-07 (×5): 1000 mg via ORAL
  Filled 2023-09-05 (×5): qty 2

## 2023-09-05 NOTE — TOC CAGE-AID Note (Signed)
Transition of Care [facility]) - CAGE-AID Screening  Patient Details  Name: Louis Barton MRN: [id number] Date of Birth: 1941/03/29  Clinical Narrative:  Patient denies any current alcohol or drug use, no need for substance abuse resources at this time.  CAGE-AID Screening:   Have You Ever Felt You Ought to Cut Down on Your Drinking or Drug Use?: No Have People Annoyed You By Critizing Your Drinking Or Drug Use?: No Have You Felt Bad Or Guilty About Your Drinking Or Drug Use?: No Have You Ever Had a Drink or Used Drugs First Thing In The Morning to Steady Your Nerves or to Get Rid of a Hangover?: No CAGE-AID Score: 0  Substance Abuse Education Offered: No

## 2023-09-05 NOTE — ED Notes (Signed)
ED TO INPATIENT HANDOFF REPORT  ED Nurse Name and Phone #: Berna Spare [phone]  S Name/Age/Gender Louis Barton 82 y.o. male Room/Bed: 006C/006C  Code Status   Code Status: Limited: Do not attempt resuscitation (DNR) -DNR-LIMITED -Do Not Intubate/DNI   Home/SNF/Other Home Patient oriented to: self, place, time, and situation Is this baseline? Yes   Triage Complete: Triage complete  Chief Complaint Near syncope [R55]  Triage Note Patient arrived via EMS from girlfriends home. PT had a near syncopal event where he became dizzy, pale, weak, and was unable to raise hands or talk. EMS reports they placed 2 IV and initial bp at his home from daughter was 60/33. Pt has paced rhythm and is on xarelto. Pt had this episode occur yesterday and then today. Pt fell yesterday and was seen at [facility] and left rib fractures. CBG 108. EMS reported a systolic of 108 en route. Pt does not feel like his pacer has been working right   Allergies No Known Allergies  Level of Care/Admitting Diagnosis ED Disposition     ED Disposition  Admit   Condition  --   Comment  Hospital Area: [facility] [[id number]]  Level of Care: Telemetry Cardiac [[id number]]  May admit patient to [facility] or [facility] if equivalent level of care is available:: No  Covid Evaluation: Asymptomatic - no recent exposure (last 10 days) testing not required  Diagnosis: Near syncope [692301]  Admitting Physician: Rana Snare [[id number]]  Attending Physician: Gust Rung [[id number]]  Certification:: I certify this patient will need inpatient services for at least 2 midnights  Expected Medical Readiness: 09/07/2023          B Medical/Surgery History Past Medical History:  Diagnosis Date   Abnormal stress test    Aftercare following surgery of the circulatory system, NEC 12/19/2013   Arthritis    DDD- aging    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 signficant DDD present with vacuum effect L4-5 and L5-S1 flat discs and anterior spurring noted   Dizziness 05/06/2020   Dyslipidemia 03/18/2015   Dyspnea on exertion 04/02/2020   Ear itch 05/14/2018   Edema 11/16/2015   Edema of both lower extremities due to peripheral venous insufficiency 02/03/2020   First degree AV block 10/20/2020   Heart failure (HCC)    History of hiatal hernia    seen on last scan- as slight    History of thrombocytopenia    History of TIAs    Hyperlipidemia    Hypertension    Irritable bowel syndrome 11/17/2016   rec trial of citrucel/ diet 11/16/2016 >>>      Local edema 02/07/2018   Malaise and fatigue 12/08/2020   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 [place]. And Dr. 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 Renal parapelvic cysts 10/06/16 on CT , right kidney midpole 1.2 cm, right kidney anterior pole 1.1 cm, and to the left kidney lower pole anterior position 3.5cm all felt to be benign   Right bundle branch block  10/20/2020   Right lower lobe pulmonary nodule 11/09/2017   Shingles    internal- obstruction of bowels & gallbladder   Solitary pulmonary nodule 11/16/2016   CT ABd 03/24/09 linerar densities in RLL and RML c/w scarring o/w clear CT Abd 10/20/16 c/w 7 mm nodule  - CT chest 02/13/2017 :  slt enlarged to 9 mm   - PET 04/04/17 :1. These slowly enlarging 9 mm right lower lobe pulmonary nodule along the right hemidiaphragm does not appear hypermetabolic today. Location adjacent to the hemidiaphragm can cause false negatives due to motion artifact related to the   Sprain of left ankle 05/21/2021   Stage 3a chronic kidney disease (HCC) 06/17/2020   Stroke (HCC)    Swelling of limb-Left foot 01/01/2015   Vitamin B12 deficiency 06/21/2017   Past Surgical History:  Procedure Laterality Date   adenocarcinoma  2019   Removed   CARDIOVERSION N/A 03/22/2021   Procedure: CARDIOVERSION;  Surgeon: Lewayne Bunting, MD;  Location: [facility] ENDOSCOPY;  Service: Cardiovascular;  Laterality: N/A;   CARDIOVERSION N/A 12/15/2021   Procedure: CARDIOVERSION;  Surgeon: Maisie Fus, MD;  Location: [facility] ENDOSCOPY;  Service: Cardiovascular;  Laterality: N/A;   CAROTID ENDARTERECTOMY  2005   Left carotid by Dr. Darrick Penna   CAROTID ENDARTERECTOMY  04/25/11   Right Carotid by Dr. Darrick Penna   COLONOSCOPY  02/23/2017   Colonic polyp status post polypectomy. Pancolonic diverticulosis predominatly in the sigmoid colon. Tubular adenoma   LEAD REVISION/REPAIR N/A 08/16/2021   Procedure: LEAD REVISION/REPAIR;  Surgeon: Regan Lemming, MD;  Location: [facility] INVASIVE CV LAB;  Service: Cardiovascular;  Laterality: N/A;   LEFT HEART CATH AND CORONARY ANGIOGRAPHY N/A 02/08/2021   Procedure: LEFT HEART CATH AND CORONARY ANGIOGRAPHY;  Surgeon: Kathleene Hazel, MD;  Location: [facility] INVASIVE CV LAB;  Service: Cardiovascular;  Laterality: N/A;   PACEMAKER IMPLANT N/A 07/28/2021   Procedure: PACEMAKER IMPLANT;  Surgeon: Regan Lemming,  MD;  Location: [facility] INVASIVE CV LAB;  Service: Cardiovascular;  Laterality: N/A;   Septoplasty, nasal w/ submucosal resection  1960   SQUAMOUS CELL CARCINOMA EXCISION     pt said numerous times   TEE WITHOUT CARDIOVERSION N/A 03/22/2021   Procedure: TRANSESOPHAGEAL ECHOCARDIOGRAM (TEE);  Surgeon: Lewayne Bunting, MD;  Location: [facility] ENDOSCOPY;  Service: Cardiovascular;  Laterality: N/A;   TONSILLECTOMY     VIDEO ASSISTED THORACOSCOPY (VATS)/WEDGE RESECTION Right 11/09/2017   Procedure: RIGHT VIDEO ASSISTED THORACOSCOPY WITH Sabino Snipes RESECTION;  Surgeon: Loreli Slot, MD;  Location: [facility] OR;  Service: Thoracic;  Laterality: Right;     A IV Location/Drains/Wounds Patient Lines/Drains/Airways Status     Active Line/Drains/Airways     Name Placement date Placement time Site Days   Peripheral IV 09/03/23 20 G Right Antecubital 09/03/23  1541  Antecubital  2   Peripheral IV 09/04/23 20 G Left;Posterior Hand 09/04/23  --  Hand  1  Intake/Output Last 24 hours No intake or output data in the 24 hours ending 09/05/23 2211  Labs/Imaging Results for orders placed or performed during the hospital encounter of 09/04/23 (from the past 48 hour(s))  CBC with Differential     Status: Abnormal   Collection Time: 09/04/23 11:52 AM  Result Value Ref Range   WBC 9.2 4.0 - 10.5 K/uL   RBC 4.75 4.22 - 5.81 MIL/uL   Hemoglobin 15.0 13.0 - 17.0 g/dL   HCT 96.0 45.4 - 09.8 %   MCV 95.6 80.0 - 100.0 fL   MCH 31.6 26.0 - 34.0 pg   MCHC 33.0 30.0 - 36.0 g/dL   RDW 11.9 14.7 - 82.9 %   Platelets 136 (L) 150 - 400 K/uL   nRBC 0.0 0.0 - 0.2 %   Neutrophils Relative % 85 %   Neutro Abs 7.8 (H) 1.7 - 7.7 K/uL   Lymphocytes Relative 8 %   Lymphs Abs 0.7 0.7 - 4.0 K/uL   Monocytes Relative 7 %   Monocytes Absolute 0.6 0.1 - 1.0 K/uL   Eosinophils Relative 0 %   Eosinophils Absolute 0.0 0.0 - 0.5 K/uL   Basophils Relative 0 %   Basophils Absolute 0.0 0.0 - 0.1 K/uL   Immature Granulocytes  0 %   Abs Immature Granulocytes 0.03 0.00 - 0.07 K/uL    Comment: Performed at [facility], [address] [place], [place] [address]  Comprehensive metabolic panel     Status: Abnormal   Collection Time: 09/04/23 11:52 AM  Result Value Ref Range   Sodium 137 135 - 145 mmol/L   Potassium 4.1 3.5 - 5.1 mmol/L   Chloride 103 98 - 111 mmol/L   CO2 24 22 - 32 mmol/L   Glucose, Bld 126 (H) 70 - 99 mg/dL    Comment: Glucose reference range applies only to samples taken after fasting for at least 8 hours.   BUN 14 8 - 23 mg/dL   Creatinine, Ser 0.86 (H) 0.61 - 1.24 mg/dL   Calcium 8.7 (L) 8.9 - 10.3 mg/dL   Total Protein 6.0 (L) 6.5 - 8.1 g/dL   Albumin 3.2 (L) 3.5 - 5.0 g/dL   AST 26 15 - 41 U/L   ALT 23 0 - 44 U/L   Alkaline Phosphatase 48 38 - 126 U/L   Total Bilirubin 1.2 (H) <1.2 mg/dL   GFR, Estimated 57 (L) >60 mL/min    Comment: (NOTE) Calculated using the CKD-EPI Creatinine Equation (2021)    Anion gap 10 5 - 15    Comment: Performed at [facility], [address], [place], [place] [address]  Magnesium     Status: None   Collection Time: 09/04/23 11:52 AM  Result Value Ref Range   Magnesium 1.9 1.7 - 2.4 mg/dL    Comment: Performed at [facility], [address], [place], [place] [address]  Troponin I (High Sensitivity)     Status: None   Collection Time: 09/04/23 12:20 PM  Result Value Ref Range   Troponin I (High Sensitivity) 8 <18 ng/L    Comment: (NOTE) Elevated high sensitivity troponin I (hsTnI) values and significant  changes across serial measurements may suggest ACS but many other  chronic and acute conditions are known to elevate hsTnI results.  Refer to the "Links" section for chest pain algorithms and additional  guidance. Performed at [facility], [address], [place], [place] [address]   CBG  monitoring, ED     Status: Abnormal   Collection Time: 09/04/23 12:46 PM  Result Value Ref Range   Glucose-Capillary 104 (H) 70 - 99  mg/dL    Comment: Glucose reference range applies only to samples taken after fasting for at least 8 hours.  Troponin I (High Sensitivity)     Status: None   Collection Time: 09/04/23  2:20 PM  Result Value Ref Range   Troponin I (High Sensitivity) 7 <18 ng/L    Comment: (NOTE) Elevated high sensitivity troponin I (hsTnI) values and significant  changes across serial measurements may suggest ACS but many other  chronic and acute conditions are known to elevate hsTnI results.  Refer to the "Links" section for chest pain algorithms and additional  guidance. Performed at [facility], [address] [place], [place] [address]   Urinalysis, Routine w reflex microscopic -Urine, Clean Catch     Status: None   Collection Time: 09/04/23  3:55 PM  Result Value Ref Range   Color, Urine YELLOW YELLOW   APPearance CLEAR CLEAR   Specific Gravity, Urine 1.015 1.005 - 1.030   pH 6.0 5.0 - 8.0   Glucose, UA NEGATIVE NEGATIVE mg/dL   Hgb urine dipstick NEGATIVE NEGATIVE   Bilirubin Urine NEGATIVE NEGATIVE   Ketones, ur NEGATIVE NEGATIVE mg/dL   Protein, ur NEGATIVE NEGATIVE mg/dL   Nitrite NEGATIVE NEGATIVE   Leukocytes,Ua NEGATIVE NEGATIVE    Comment: Performed at [facility], [address], [place], [place] [address]  Basic metabolic panel     Status: Abnormal   Collection Time: 09/05/23  6:49 AM  Result Value Ref Range   Sodium 139 135 - 145 mmol/L   Potassium 4.2 3.5 - 5.1 mmol/L   Chloride 106 98 - 111 mmol/L   CO2 26 22 - 32 mmol/L   Glucose, Bld 97 70 - 99 mg/dL    Comment: Glucose reference range applies only to samples taken after fasting for at least 8 hours.   BUN 12 8 - 23 mg/dL   Creatinine, Ser 0.98 0.61 - 1.24 mg/dL   Calcium 8.3 (L) 8.9 - 10.3 mg/dL   GFR, Estimated >11 >91 mL/min    Comment: (NOTE) Calculated using the CKD-EPI Creatinine Equation (2021)    Anion gap 7 5 - 15    Comment: Performed at [facility], [address], [place], [place]  [address]  CBC     Status: Abnormal   Collection Time: 09/05/23  6:49 AM  Result Value Ref Range   WBC 8.2 4.0 - 10.5 K/uL   RBC 4.39 4.22 - 5.81 MIL/uL   Hemoglobin 13.5 13.0 - 17.0 g/dL   HCT 56.2 13.0 - 86.5 %   MCV 95.9 80.0 - 100.0 fL   MCH 30.8 26.0 - 34.0 pg   MCHC 32.1 30.0 - 36.0 g/dL   RDW 78.4 69.6 - 29.5 %   Platelets 118 (L) 150 - 400 K/uL    Comment: CONSISTENT WITH PREVIOUS RESULT REPEATED TO VERIFY    nRBC 0.0 0.0 - 0.2 %    Comment: Performed at [facility], [address], [place], [place] [address]   No results found.  Pending Labs Unresulted Labs (From admission, onward)    None       Vitals/Pain Today's Vitals   09/05/23 1015 09/05/23 1332 09/05/23 1600 09/05/23 1709  BP: (!) 153/84 104/75  (!) 160/93  Pulse: (!) 59 (!) 58  [name])  59  Resp: 18 15  14   Temp:  98.2 F (36.8 C)  98.6 F (37 C)  TempSrc:  Oral  Oral  SpO2: 100% 100%  100%  PainSc:   6      Isolation Precautions No active isolations  Medications Medications  senna-docusate (Senokot-S) tablet 1 tablet (has no administration in time range)  rosuvastatin (CRESTOR) tablet 40 mg (40 mg Oral Given 09/04/23 2351)  rivaroxaban (XARELTO) tablet 20 mg (20 mg Oral Given 09/04/23 2352)  colesevelam [facility]) tablet 1,250 mg (1,250 mg Oral Given 09/05/23 1800)  amiodarone (PACERONE) tablet 200 mg (200 mg Oral Given 09/05/23 0930)  lidocaine (LIDODERM) 5 % 2 patch (2 patches Transdermal Patch Removed 09/05/23 1240)  acetaminophen (TYLENOL) tablet 1,000 mg (1,000 mg Oral Given 09/05/23 1700)  acetaminophen (TYLENOL) tablet 1,000 mg (1,000 mg Oral Given 09/04/23 1717)    Mobility walks with device     Focused Assessments     R Recommendations: See Admitting Provider Note  Report given to:   Additional Notes: Call or message with questions.

## 2023-09-05 NOTE — Evaluation (Signed)
Physical Therapy Evaluation Patient Details Name: Louis Barton MRN: [id number] DOB: [birth year] Today's Date: 09/05/2023  History of Present Illness  Pt is 82 yo male presenting on 09/04/23 with near syncopal episodes for further work up.  Pt with recent falls.  Found to have L 9th/10th rib fx.  Pt with hx including but not limited to malignant lung CA, HLD, BPPV, symptomatic bradycardia with pacemaker, CVA, ascending aortic aneurysm, CKD, PAD, aflutter, OA, HF  Clinical Impression  Pt admitted with above diagnosis. At baseline, pt lives alone and is independent.  Reports does have a shuffle gait but last fall was at least 2 years ago other than the 2 falls and 1 near fall this past weekend.  Today, pt seen for PT eval with orthostatic monitored.  Daughter reports that at home episodes have been once pt up for 15 mins, not immediate.  Pt did have drop in BP with standing but < 20 mmHg; however, after ambulation had significant drop - denied dizzy/lightheaded but reports weak.  Pt also with poor balance with posterior bias requiring RW and min A.   Pt currently with functional limitations due to the deficits listed below (see PT Problem List). Pt will benefit from acute skilled PT to increase their independence and safety with mobility to allow discharge.  With his recent falls, living alone, and poor balance - do recommend Patient will benefit from continued inpatient follow up therapy, <3 hours/day  at d/c.  BP as follows: Supine 124/81, MAP 84    Sitting 120/75 , MAP 87 Standing 109/67 Standing 3 min 109/68 Standing after walk 79/61 with MAP 66 Return to sit 88/58 with MAP 69 Return to supine, head elevated 128/74 MAP 91  HR consistently in 60's and O2 sats 97% on RA         If plan is discharge home, recommend the following: A little help with walking and/or transfers;A little help with bathing/dressing/bathroom;Assistance with cooking/housework;Help with stairs or ramp for entrance   Can  travel by private vehicle   Yes    Equipment Recommendations None recommended by PT  Recommendations for Other Services       Functional Status Assessment Patient has had a recent decline in their functional status and demonstrates the ability to make significant improvements in function in a reasonable and predictable amount of time.     Precautions / Restrictions Precautions Precautions: Fall Precaution Comments: orthostatic hypotension      Mobility  Bed Mobility Overal bed mobility: Needs Assistance Bed Mobility: Rolling, Sidelying to Sit, Sit to Sidelying Rolling: Min assist Sidelying to sit: Mod assist     Sit to sidelying: Mod assist General bed mobility comments: Rolling to R for sidelying to sit due to L rib fx    Transfers Overall transfer level: Needs assistance Equipment used: None, Rolling walker (2 wheels) Transfers: Sit to/from Stand Sit to Stand: Min assist           General transfer comment: Performed with and without RW; min A to steady with both but posterior balance without RW    Ambulation/Gait Ambulation/Gait assistance: Min assist, Mod assist Gait Distance (Feet): 60 Feet Assistive device: Rolling walker (2 wheels), None Gait Pattern/deviations: Step-to pattern, Trunk flexed, Shuffle Gait velocity: decreased     General Gait Details: tried a few steps at bedside without RW as does not use at baseline but unsteady requiring mod A with posterior bias; with RW required light min A, cues for RW proximity  Stairs  Wheelchair Mobility     Tilt Bed    Modified Rankin (Stroke Patients Only)       Balance Overall balance assessment: Needs assistance Sitting-balance support: No upper extremity supported Sitting balance-Leahy Scale: Good     Standing balance support: Bilateral upper extremity supported, Reliant on assistive device for balance Standing balance-Leahy Scale: Poor Standing balance comment: Posterior bias  requiring min/mod A without AD; with RW CGA/min A                             Pertinent Vitals/Pain Pain Assessment Pain Assessment: Faces Faces Pain Scale: Hurts little more Pain Location: L ribs with transfers Pain Descriptors / Indicators: Discomfort, Grimacing Pain Intervention(s): Limited activity within patient's tolerance, Monitored during session, Premedicated before session, Repositioned    Home Living Family/patient expects to be discharged to:: Private residence Living Arrangements: Alone Available Help at Discharge: Family;Available PRN/intermittently Type of Home: House Home Access: Stairs to enter   Entrance Stairs-Number of Steps: 1 small   Home Layout: One level Home Equipment: Agricultural consultant (2 wheels);Rollator (4 wheels);Tub bench;BSC/3in1      Prior Function Prior Level of Function : Independent/Modified Independent;Driving             Mobility Comments: could ambulate in community without AD; Pt has had 2 falls and 1 near fall in the last week.  States has shuffle pattern at baseline ADLs Comments: independent with adls and light iadls     Extremity/Trunk Assessment   Upper Extremity Assessment Upper Extremity Assessment: Overall WFL for tasks assessed (Did not MMT due to rib fx pain)    Lower Extremity Assessment Lower Extremity Assessment: Overall WFL for tasks assessed    Cervical / Trunk Assessment Cervical / Trunk Assessment: Normal  Communication      Cognition Arousal: Alert Behavior During Therapy: WFL for tasks assessed/performed Overall Cognitive Status: Within Functional Limits for tasks assessed                                          General Comments General comments (skin integrity, edema, etc.): Daughter presetn    Exercises     Assessment/Plan    PT Assessment Patient needs continued PT services  PT Problem List Decreased strength;Cardiopulmonary status limiting activity;Pain;Decreased  range of motion;Decreased activity tolerance;Decreased knowledge of use of DME;Decreased balance;Decreased mobility       PT Treatment Interventions DME instruction;Therapeutic exercise;Gait training;Balance training;Stair training;Functional mobility training;Therapeutic activities;Patient/family education;Neuromuscular re-education;Modalities    PT Goals (Current goals can be found in the Care Plan section)  Acute Rehab PT Goals Patient Stated Goal: open to short term rehab PT Goal Formulation: With patient/family Time For Goal Achievement: 09/19/23 Potential to Achieve Goals: Good    Frequency Min 1X/week     Co-evaluation               AM-PAC PT "6 Clicks" Mobility  Outcome Measure Help needed turning from your back to your side while in a flat bed without using bedrails?: A Little Help needed moving from lying on your back to sitting on the side of a flat bed without using bedrails?: A Lot Help needed moving to and from a bed to a chair (including a wheelchair)?: A Little Help needed standing up from a chair using your arms (e.g., wheelchair or bedside chair)?: A Lot Help needed  to walk in hospital room?: A Little Help needed climbing 3-5 steps with a railing? : A Lot 6 Click Score: 15    End of Session Equipment Utilized During Treatment: Gait belt (gait belt high) Activity Tolerance: Treatment limited secondary to medical complications (Comment) Patient left: in bed;with call bell/phone within reach;with family/visitor present (in ED) Nurse Communication: Mobility status;Other (comment) (orthostatic hypotension; also secure chat to MD) PT Visit Diagnosis: Other abnormalities of gait and mobility (R26.89);Muscle weakness (generalized) (M62.81);History of falling (Z91.81)    Time: 1610-9604 PT Time Calculation (min) (ACUTE ONLY): 36 min   Charges:   PT Evaluation $PT Eval High Complexity: 1 High PT Treatments $Gait Training: 8-22 mins PT General Charges $$  ACUTE PT VISIT: 1 Visit         Anise Salvo, PT Acute Rehab Services [facility] [phone]   Rayetta Humphrey 09/05/2023, 4:00 PM

## 2023-09-05 NOTE — Consult Note (Addendum)
Cardiology Consultation   Patient ID: ARISTOTLE STELLWAGEN MRN: [id number]; DOB: [birth year]  Admit date: 09/04/2023 Date of Consult: 09/05/2023  PCP:  Gordan Payment., MD   [facility] Providers Cardiologist:  Gypsy Balsam, MD  Electrophysiologist:  Regan Lemming, MD  {    Patient Profile:   Louis Barton is a 82 y.o. 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  History of Present Illness:   [name] with above PMH presented to ER for syncope episodes. He sufferers chronic dizziness with a diagnosis of BPPV.   From chart review, he has been c/o ongoing dizziness associated with falls and gait instability during office visit to PCP, cardiology, and EP office since 03/2023.    PCP reduced losartan to 25mg  in 03/2023 and recommended a rollator that he declined.   He saw Wallis Bamberg NP 04/03/23, did not want to further reduce losartan.   He saw EP Francis Dowse 05/10/23, felt his dizziness is different from chronic vertigo, felt like a sense of losing balance while moving around, has been persistent since 2022 when he had COVID. He had expressed concern that symptoms may be due to amiodarone or Eliquis, or he may have Parkinson, or related to his vision. He was noted with some orthostasis with BP changes and not HR. He also does not like to drink water.  His device interogation was normal that day, no program change was made. He was noted with 0% A fib/flutter burden. He was advised to see  neurology for further evaluation and potential stopping amiodarone which he did not want to change at the time.   He saw Dr Bing Matter 06/05/23, expressed concern that his dizziness episodes are related to Eliquis.  He was switched to Xarelto 20 mg daily.  He was felt stable from CAD and diastolic heart failure standpoint.  He saw neurology Dr. Threasa Beards 08/17/2023, felt patient has no significant Parkinsonism feature based on physical exam, there was concern for cervical myopathy where x-ray did reveal significant degenerative change. He suppose get CT for his c spine.   Today, he states he continue to have these dizziness and imbalance episodes, since 2022 after he had COVID. He felt these episode are different than his vertigo. He described a sense of off balance while moving around on daily basis. He states he is more prone to fall especially when there is people around him. He was at ER Sunday all day due to fall and a broken rib, was NPO all day, after getting home, he was sitting and became unconscious while eating dinner. Episode was short lived and family thought he was dehydrated. Today he was sitting at the table for breakfast this morning, he suddenly slumped over and did not respond to his family. He states he was aware of his surroundings, but could not respond. He was mumbling, pale, diaphoretic, clammy.  He recalls hearing her family asking what he want to eat. Episodes lasted 5 minutes. EMS called, his daughter found his BP was low 60/33. At  ER he is found orthostatic hypotensive today. He also mentioned he took additional losartan last week due to elevated BP up to 160 systolic. He took additional Lasix Friday for ankle swelling.  He denied any chest pain, SOB, worsening BLE edema. He had overall low PO intake during the weekend.   Initial labs showed AKI with Cr 1.25 AND gfr 57, plt 136K. Hs trop 8>7. UA unremarkable. Repeat labs today showed Cr down to 1.02 and GFR >60. PLT 118k. CT torso showed  acute fracture of left night an tenth ribs, no pneumothorax, small right and trace left pleural effusion, soft tissue attenuation in the left inguinal canal may represent hematoma.  CT head revealed no acute intracranial pathology, small vessel white matter disease in keeping with advanced patient age, no fracture or subluxation of cervical spine, moderate to severe multilevel cervical disc degenerative disease worse from C4-C7.   Cardiology is consulted today regarding syncope episodes.    From cardiology standpoint, he follows Dr. Bing Matter, most recent left heart catheterization was done 02/08/2021 revealing nonobstructive CAD with 20% mid to distal circumflex lesion, 20% proximal to mid LAD lesion, mildly elevated LVEDP.  Most recent echocardiogram from 04/13/2022 showed LVEF 55 to 60%, no regional motion abnormality, indeterminate diastolic parameter, normal RV, mild to moderate LAE, moderate MR, trivial AI. He was diagnosed with typical A flutter with slow ventricular response 02/2021, placed on Eliquis 5 mg twice daily, underwent TEE/DCCV with conversion to sinus bradycardia on 03/22/2021.  Later on, he developed second-degree AV block intermittently with significant weakness and fatigue. He ultimately underwent Medtronic dual-chamber pacemaker for symptomatic bradycardia secondary to second-degree heart block 07/28/2021 by Dr. Elberta Fortis.  He required RV lead revision on 08/16/2021 by Dr. Elberta Fortis due to failure to capture.  He went back to atrial flutter 10/2021 and was started on amiodarone for rhythm control.  Most recent device check on 07/26/2023 revealed normal device function, DDDR, battery longevity 8 yrs and 8 month, no AT/AF/VT, 83.2%  atrial pacing since 07/28/21.      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 signficant DDD present with vacuum effect L4-5 and L5-S1 flat discs and anterior spurring noted   Dizziness 05/06/2020   Dyslipidemia 03/18/2015   Dyspnea on exertion 04/02/2020   Ear itch 05/14/2018   Edema 11/16/2015   Edema of both lower extremities due to peripheral venous insufficiency 02/03/2020   First degree AV block 10/20/2020   Heart failure (HCC)    History of hiatal hernia    seen on last scan- as slight  History of thrombocytopenia    History of TIAs    Hyperlipidemia    Hypertension    Irritable bowel syndrome 11/17/2016   rec trial of citrucel/ diet 11/16/2016 >>>      Local edema 02/07/2018   Malaise and fatigue 12/08/2020   Malignant neoplasm of left lung (HCC) 12/08/2017   Stage 1A. [place]. And Dr. 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 Renal parapelvic cysts 10/06/16 on CT , right kidney midpole 1.2 cm, right kidney anterior pole 1.1 cm, and to the left kidney lower pole anterior position 3.5cm all felt to be  benign   Right bundle branch block 10/20/2020   Right lower lobe pulmonary nodule 11/09/2017   Shingles    internal- obstruction of bowels & gallbladder   Solitary pulmonary nodule 11/16/2016   CT ABd 03/24/09 linerar densities in RLL and RML c/w scarring o/w clear CT Abd 10/20/16 c/w 7 mm nodule  - CT chest 02/13/2017 :  slt enlarged to 9 mm   - PET 04/04/17 :1. These slowly enlarging 9 mm right lower lobe pulmonary nodule along the right hemidiaphragm does not appear hypermetabolic today. Location adjacent to the hemidiaphragm can cause false negatives due to motion artifact related to the   Sprain of left ankle 05/21/2021   Stage 3a chronic kidney disease (HCC) 06/17/2020   Stroke (HCC)    Swelling of limb-Left foot 01/01/2015   Vitamin B12 deficiency 06/21/2017    Past Surgical History:  Procedure Laterality Date   adenocarcinoma  2019   Removed   CARDIOVERSION N/A 03/22/2021   Procedure: CARDIOVERSION;  Surgeon: Lewayne Bunting, MD;  Location: [facility] ENDOSCOPY;  Service: Cardiovascular;  Laterality: N/A;   CARDIOVERSION N/A 12/15/2021   Procedure: CARDIOVERSION;  Surgeon: Maisie Fus, MD;  Location: [facility] ENDOSCOPY;  Service: Cardiovascular;  Laterality: N/A;   CAROTID ENDARTERECTOMY  2005   Left carotid by Dr. Darrick Penna   CAROTID ENDARTERECTOMY  04/25/11   Right Carotid by Dr. Darrick Penna   COLONOSCOPY  02/23/2017   Colonic polyp status post polypectomy. Pancolonic diverticulosis predominatly in the sigmoid colon. Tubular adenoma   LEAD REVISION/REPAIR N/A 08/16/2021   Procedure: LEAD REVISION/REPAIR;  Surgeon: Regan Lemming, MD;  Location: [facility] INVASIVE CV LAB;  Service: Cardiovascular;  Laterality: N/A;   LEFT HEART CATH AND CORONARY ANGIOGRAPHY N/A 02/08/2021   Procedure: LEFT HEART CATH AND CORONARY ANGIOGRAPHY;  Surgeon: Kathleene Hazel, MD;  Location: [facility] INVASIVE CV LAB;  Service: Cardiovascular;  Laterality: N/A;   PACEMAKER IMPLANT N/A 07/28/2021   Procedure: PACEMAKER  IMPLANT;  Surgeon: Regan Lemming, MD;  Location: [facility] INVASIVE CV LAB;  Service: Cardiovascular;  Laterality: N/A;   Septoplasty, nasal w/ submucosal resection  1960   SQUAMOUS CELL CARCINOMA EXCISION     pt said numerous times   TEE WITHOUT CARDIOVERSION N/A 03/22/2021   Procedure: TRANSESOPHAGEAL ECHOCARDIOGRAM (TEE);  Surgeon: Lewayne Bunting, MD;  Location: [facility] ENDOSCOPY;  Service: Cardiovascular;  Laterality: N/A;   TONSILLECTOMY     VIDEO ASSISTED THORACOSCOPY (VATS)/WEDGE RESECTION Right 11/09/2017   Procedure: RIGHT VIDEO ASSISTED THORACOSCOPY WITH Sabino Snipes RESECTION;  Surgeon: Loreli Slot, MD;  Location: [facility] OR;  Service: Thoracic;  Laterality: Right;  Home Medications:  Prior to Admission medications   Medication Sig Start Date End Date Taking? Authorizing Provider  acetaminophen (TYLENOL) 500 MG tablet Take 1,000 mg by mouth every 8 (eight) hours as needed for mild pain or moderate pain (for pain.).   Yes [provider]  Cholecalciferol (VITAMIN D3) 50 MCG (2000 UT) TABS Take 4,000 Units by mouth every other day.   Yes [provider]  colesevelam (WELCHOL) 625 MG tablet Take 1,250 mg by mouth 2 (two) times daily with a meal.   Yes [provider]  Cyanocobalamin (VITAMIN B-12 IJ) Inject 1 each as directed every 30 (thirty) days.   Yes [provider]  furosemide (LASIX) 20 MG tablet Take 20 mg by mouth daily.   Yes [provider]  Polyethyl Glycol-Propyl Glycol (SYSTANE ULTRA OP) Place 1 drop into both eyes daily. Unknown strength   Yes [provider]  rivaroxaban (XARELTO) 20 MG TABS tablet Take 1 tablet (20 mg total) by mouth daily with supper. 06/05/23  Yes Georgeanna Lea, MD  rivastigmine (EXELON) 4.6 mg/24hr Place 4.6 mg onto the skin daily.   Yes [provider]  rosuvastatin (CRESTOR) 40 MG tablet Take 1 tablet (40 mg total) by mouth at bedtime. 05/13/21  Yes Zigmund Daniel., MD   amiodarone (PACERONE) 200 MG tablet Take 200 mg by mouth daily.    [provider]    Inpatient Medications: Scheduled Meds:  amiodarone  200 mg Oral Daily   colesevelam  1,250 mg Oral BID WC   lidocaine  2 patch Transdermal Q24H   rivaroxaban  20 mg Oral Q supper   rosuvastatin  40 mg Oral QHS   Continuous Infusions:  PRN Meds: acetaminophen **OR** acetaminophen, senna-docusate  Allergies:   No Known Allergies  Social History:   Social History   Socioeconomic History   Marital status: Widowed    Spouse name: Not on file   Number of children: 2   Years of education: Not on file   Highest education level: Not on file  Occupational History   Not on file  Tobacco Use   Smoking status: Former    Current packs/day: 0.00    Average packs/day: 1 pack/day for 16.0 years (16.0 ttl pk-yrs)    Types: Cigarettes    Start date: 09/26/1956    Quit date: 09/26/1972    Years since quitting: 50.9   Smokeless tobacco: Never   Tobacco comments:    Former smoker 08/11/21  Vaping Use   Vaping status: Never Used  Substance and Sexual Activity   Alcohol use: Not Currently    Alcohol/week: 7.0 standard drinks of alcohol    Types: 7 Shots of liquor per week    Comment: 1 shot daily 08/11/21   Drug use: No   Sexual activity: Not Currently  Other Topics Concern   Not on file  Social History Narrative   Not on file   Social Determinants of Health   Financial Resource Strain: Not on file  Food Insecurity: No Food Insecurity (09/05/2023)   Hunger Vital Sign    Worried About Running Out of Food in the Last Year: Never true    Ran Out of Food in the Last Year: Never true  Transportation Needs: No Transportation Needs (09/05/2023)   PRAPARE - Administrator, Civil Service (Medical): No    Lack of Transportation (Non-Medical): No  Physical Activity: Not on file  Stress: Not on file  Social Connections:  Not on file  Intimate Partner Violence: Not At Risk  (09/05/2023)   Humiliation, Afraid, Rape, and Kick questionnaire    Fear of Current or Ex-Partner: No    Emotionally Abused: No    Physically Abused: No    Sexually Abused: No    Family History:    Family History  Problem Relation Age of Onset   Cancer Mother 11       Breast   Stroke Mother    Hyperlipidemia Mother    Other Mother        varicose veins   Heart attack Mother    Heart disease Mother        Left ankle swelling   Hypertension Mother    Deep vein thrombosis Mother    Varicose Veins Mother    Heart attack Father    Other Sister        Tumor in Lung and Brain   Cancer Sister        Tumor   Lung  and  Brain   Cancer Brother        BCC-SCC-Merkle Cell   Colon cancer Neg Hx    Esophageal cancer Neg Hx    Rectal cancer Neg Hx    Stomach cancer Neg Hx      ROS:  Constitutional: Denied fever, chills, malaise, night sweats Eyes: Denied vision change or loss Ears/Nose/Mouth/Throat: Denied ear ache, sore throat, coughing, sinus pain Cardiovascular: see HPI  Respiratory: see HPI  Gastrointestinal: Denied nausea, vomiting, abdominal pain, diarrhea Genital/Urinary: Denied dysuria, hematuria, urinary frequency/urgency Musculoskeletal: Denied muscle ache, joint pain, weakness Skin: Denied rash, wound Neuro: see HPI  Psych: Denied history of depression/anxiety  Endocrine: Denied history of diabetes     Physical Exam/Data:   Vitals:   09/05/23 0930 09/05/23 1000 09/05/23 1015 09/05/23 1332  BP: 115/71 138/89 (!) 153/84 104/75  Pulse: (!) 59 (!) 59 (!) 59 (!) 58  Resp: (!) 22 16 18 15   Temp:    98.2 F (36.8 C)  TempSrc:    Oral  SpO2: 100% 100% 100% 100%   No intake or output data in the 24 hours ending 09/05/23 1505    09/03/2023    2:22 PM 09/03/2023   12:56 PM 08/17/2023   10:47 AM  Last 3 Weights  Weight (lbs) 158 lb 15.2 oz 159 lb 163 lb  Weight (kg) 72.1 kg 72.122 kg 73.936 kg     There is no height or weight on file to calculate BMI.   Vitals:   Vitals:   09/05/23 1332 09/05/23 1709  BP: 104/75 (!) 160/93  Pulse: (!) 58 (!) 59  Resp: 15 14  Temp: 98.2 F (36.8 C) 98.6 F (37 C)  SpO2: 100% 100%   General Appearance: In no apparent distress, laying in bed HEENT: Normocephalic, atraumatic. EOMs intact. Sclera anicteric.  Neck: Supple, trachea midline, no JVDs Cardiovascular: Irregular rhythm, S1S2, no murmur  Respiratory: Resting breathing unlabored, lungs sounds clear to auscultation bilaterally, no use of accessory muscles. On room air.  Speaks full sentence  Gastrointestinal: Bowel sounds positive, abdomen soft, non-tender  Extremities: Able to move all extremities in bed without difficulty, no edema of BLE Musculoskeletal: Normal muscle bulk and tone, muscle strength 5/5 throughout, global weakness, left rib tenderness  Skin: Intact, warm, dry.  Neurologic: Alert, oriented to person, place and time. Fluent speech, no facial droop, no cognitive deficit, no pronator drift, no gross sensory deficit, no focal muscle weakness, no gross focal neuro  deficit Psychiatric: Normal affect. Mood is appropriate.      EKG:  The EKG was personally reviewed and demonstrates:    Atrial flutter 60bpm  Telemetry:  Telemetry was personally reviewed and demonstrates:    Atrial flutter 60bpm   Relevant CV Studies:   Device check 07/26/2023:  Normal remote reviewed. Battery and lead parameters stable.    Echo from 04/13/2022:  1. Left ventricular ejection fraction, by estimation, is 55 to 60%. The  left ventricle has normal function. The left ventricle has no regional  wall motion abnormalities. Left ventricular diastolic parameters are  indeterminate.   2. Right ventricular systolic function is normal. The right ventricular  size is normal.   3. Left atrial size was mild to moderately dilated.   4. The mitral valve is normal in structure. Moderate mitral valve  regurgitation. No evidence of mitral stenosis.   5. The aortic  valve is normal in structure. Aortic valve regurgitation is  trivial. No aortic stenosis is present.   6. The inferior vena cava is normal in size with greater than 50%  respiratory variability, suggesting right atrial pressure of 3 mmHg.   Left heart catheterization 02/08/2021:  Mid Cx to Dist Cx lesion is 20% stenosed. Prox LAD to Mid LAD lesion is 20% stenosed.   1. Mild non-obstructive CAD 2. Mild elevation LVEDP   Recommendations: Medical management of mild CAD  Laboratory Data:  High Sensitivity Troponin:   Recent Labs  Lab 09/04/23 1220 09/04/23 1420  TROPONINIHS 8 7     Chemistry Recent Labs  Lab 09/03/23 1520 09/04/23 1152 09/05/23 0649  NA 138 137 139  K 4.3 4.1 4.2  CL 104 103 106  CO2 25 24 26   GLUCOSE 85 126* 97  BUN 15 14 12   CREATININE 0.96 1.25* 1.02  CALCIUM 8.6* 8.7* 8.3*  MG  --  1.9  --   GFRNONAA >60 57* >60  ANIONGAP 9 10 7     Recent Labs  Lab 09/03/23 1520 09/04/23 1152  PROT 6.6 6.0*  ALBUMIN 3.8 3.2*  AST 29 26  ALT 26 23  ALKPHOS 51 48  BILITOT 0.9 1.2*   Lipids No results for input(s): "CHOL", "TRIG", "HDL", "LABVLDL", "LDLCALC", "CHOLHDL" in the last 168 hours.  Hematology Recent Labs  Lab 09/03/23 1520 09/04/23 1152 09/05/23 0649  WBC 8.7 9.2 8.2  RBC 4.99 4.75 4.39  HGB 15.3 15.0 13.5  HCT 47.7 45.4 42.1  MCV 95.6 95.6 95.9  MCH 30.7 31.6 30.8  MCHC 32.1 33.0 32.1  RDW 14.5 14.4 14.2  PLT 124* 136* 118*   Thyroid No results for input(s): "TSH", "FREET4" in the last 168 hours.  BNPNo results for input(s): "BNP", "PROBNP" in the last 168 hours.  DDimer No results for input(s): "DDIMER" in the last 168 hours.   Radiology/Studies:  CT CHEST ABDOMEN PELVIS W CONTRAST  Result Date: 09/03/2023 CLINICAL DATA:  Fall from standing with pain. EXAM: CT CHEST, ABDOMEN, AND PELVIS WITH CONTRAST TECHNIQUE: Multidetector CT imaging of the chest, abdomen and pelvis was performed following the standard protocol during bolus  administration of intravenous contrast. RADIATION DOSE REDUCTION: This exam was performed according to the departmental dose-optimization program which includes automated exposure control, adjustment of the mA and/or kV according to patient size and/or use of iterative reconstruction technique. CONTRAST:  75mL OMNIPAQUE IOHEXOL 300 MG/ML  SOLN COMPARISON:  CT chest, abdomen, and pelvis dated 10/20/2022. FINDINGS: CT CHEST FINDINGS Cardiovascular: Vascular calcifications are seen in the coronary  arteries and aortic arch. The heart is enlarged. No pericardial effusion. A left subclavian cardiac device is redemonstrated. Mediastinum/Nodes: No enlarged mediastinal, hilar, or axillary lymph nodes. Thyroid gland, trachea, and esophagus demonstrate no significant findings. Lungs/Pleura: Postoperative changes and round atelectasis/scarring are redemonstrated in the right lower lung. There is a small right pleural effusion with associated atelectasis. A there is a trace left pleural effusion with associated atelectasis. No pneumothorax on either side. Musculoskeletal: There are acute fractures of the left ninth and tenth ribs. Degenerative changes are seen in the spine. CT ABDOMEN PELVIS FINDINGS Hepatobiliary: No focal liver abnormality is seen. No gallstones, gallbladder wall thickening, or biliary dilatation. Pancreas: Unremarkable. No pancreatic ductal dilatation or surrounding inflammatory changes. Spleen: Normal in size without focal abnormality. Adrenals/Urinary Tract: Adrenal glands are unremarkable. Bilateral renal cysts measure up to 2.0 cm on the right and 4.4 cm on the left. No imaging follow-up is recommended for this finding. No renal calculi or hydronephrosis on either side. Bladder is unremarkable. Stomach/Bowel: Stomach is within normal limits. A lipoma in the duodenum is redemonstrated. The appendix is not definitely identified and is likely surgically absent. There is colonic diverticulosis without  evidence of diverticulitis. No evidence of bowel wall thickening, distention, or inflammatory changes. Vascular/Lymphatic: Aortic atherosclerosis. No enlarged abdominal or pelvic lymph nodes. Reproductive: Prostate is unremarkable. Other: There is soft tissue attenuation in the left inguinal canal which has developed since prior exam. Musculoskeletal: Degenerative changes are seen in the spine. IMPRESSION: 1. Acute fractures of the left ninth and tenth ribs. No pneumothorax. 2. Small right and trace left pleural effusions with associated atelectasis. 3. Soft tissue attenuation in the left inguinal canal has developed since prior exam. This may represent a hematoma given the history of trauma. Aortic Atherosclerosis (ICD10-I70.0). Electronically Signed   By: Romona Curls M.D.   On: 09/03/2023 18:11   DG Ribs Unilateral W/Chest Left  Result Date: 09/03/2023 CLINICAL DATA:  Left lower rib pain.  Multiple recent falls. EXAM: LEFT RIBS AND CHEST - 3+ VIEW COMPARISON:  CT 10/20/2022. FINDINGS: Acute, nondisplaced fracture of the anterior aspect of the ninth rib. Slightly angulated fracture involving the anterior aspect of the left tenth rib. Left chest wall pacer device is noted with leads in the right atrial appendage and right ventricle. Cardiac enlargement and aortic atherosclerosis. Postoperative changes noted within the right lower lung with surrounding parenchymal scarring, architectural distortion and pleural thickening. This appears similar to chest CT from 10/20/2022. IMPRESSION: 1. Acute fractures of the anterior aspects of the left ninth and tenth ribs. 2. Cardiac enlargement and aortic atherosclerosis. 3. Postoperative changes within the right lower lung with surrounding pleural thickening and scarring. Electronically Signed   By: Signa Kell M.D.   On: 09/03/2023 16:22   DG Femur 1 View Left  Result Date: 09/03/2023 CLINICAL DATA:  Left-sided pain post recent falls EXAM: LEFT FEMUR 1 VIEW  COMPARISON:  None Available. FINDINGS: There is no evidence of fracture or other focal bone lesions. Soft tissues are unremarkable. Patchy arterial calcifications. IMPRESSION: Negative. Electronically Signed   By: Corlis Leak M.D.   On: 09/03/2023 16:20   DG Pelvis 1-2 Views  Result Date: 09/03/2023 CLINICAL DATA:  Recent falls, left-sided pain EXAM: PELVIS - 1-2 VIEW COMPARISON:  CT 10/20/2022 FINDINGS: There is no evidence of pelvic fracture or diastasis. No pelvic bone lesions are seen. Degenerative disc disease in the lower lumbar spine. IMPRESSION: 1. No acute findings. 2. Lower lumbar degenerative disc disease. Electronically Signed  By: Corlis Leak M.D.   On: 09/03/2023 16:20   CT Head Wo Contrast  Result Date: 09/03/2023 CLINICAL DATA:  Multiple recent falls, on anticoagulation EXAM: CT HEAD WITHOUT CONTRAST CT CERVICAL SPINE WITHOUT CONTRAST TECHNIQUE: Multidetector CT imaging of the head and cervical spine was performed following the standard protocol without intravenous contrast. Multiplanar CT image reconstructions of the cervical spine were also generated. RADIATION DOSE REDUCTION: This exam was performed according to the departmental dose-optimization program which includes automated exposure control, adjustment of the mA and/or kV according to patient size and/or use of iterative reconstruction technique. COMPARISON:  04/05/2023 FINDINGS: CT HEAD FINDINGS Brain: No evidence of acute infarction, hemorrhage, hydrocephalus, extra-axial collection or mass lesion/mass effect. Periventricular and deep white matter hypodensity. Vascular: No hyperdense vessel or unexpected calcification. Skull: Normal. Negative for fracture or focal lesion. Sinuses/Orbits: No acute finding. Other: None. CT CERVICAL SPINE FINDINGS Alignment: Degenerative straightening of the normal cervical lordosis. Skull base and vertebrae: No acute fracture. No primary bone lesion or focal pathologic process. Soft tissues and spinal  canal: No prevertebral fluid or swelling. No visible canal hematoma. 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 Upper chest: Negative. Other: None. IMPRESSION: 1. No acute intracranial pathology. 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 2. No fracture or static subluxation of the cervical spine. 3. 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 Electronically Signed   By: Jearld Lesch M.D.   On: 09/03/2023 16:03   CT Cervical Spine Wo Contrast  Result Date: 09/03/2023 CLINICAL DATA:  Multiple recent falls, on anticoagulation EXAM: CT HEAD WITHOUT CONTRAST CT CERVICAL SPINE WITHOUT CONTRAST TECHNIQUE: Multidetector CT imaging of the head and cervical spine was performed following the standard protocol without intravenous contrast. Multiplanar CT image reconstructions of the cervical spine were also generated. RADIATION DOSE REDUCTION: This exam was performed according to the departmental dose-optimization program which includes automated exposure control, adjustment of the mA and/or kV according to patient size and/or use of iterative reconstruction technique. COMPARISON:  04/05/2023 FINDINGS: CT HEAD FINDINGS Brain: No evidence of acute infarction, hemorrhage, hydrocephalus, extra-axial collection or mass lesion/mass effect. Periventricular and deep white matter hypodensity. Vascular: No hyperdense vessel or unexpected calcification. Skull: Normal. Negative for fracture or focal lesion. Sinuses/Orbits: No acute finding. Other: None. CT CERVICAL SPINE FINDINGS Alignment: Degenerative straightening of the normal cervical lordosis. Skull base and vertebrae: No acute fracture. No primary bone lesion or focal pathologic process. Soft tissues and spinal canal: No prevertebral fluid or swelling. No visible canal hematoma. Disc levels: Moderate to severe multilevel cervical disc  degenerative disease, worst from C4 through C7. Upper chest: Negative. Other: None. IMPRESSION: 1. No acute intracranial pathology. 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 2. No fracture or static subluxation of the cervical spine. 3. 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 Electronically Signed   By: Jearld Lesch M.D.   On: 09/03/2023 16:03     Assessment and Plan:   Syncope Dizziness with gait instability since 2022 Atrial flutter with controlled ventricular response  -Patient complains of ongoing dizziness and sense of imbalance causing frequent falls when moving around since 2022 after he had COVID, he has PPM placed 07/2021 for 2nd AVB /A flutter with SVR, amiodarone started 10/2021, eliquis switched to Xarelto 05/2023 without much change of symptoms, now with new onset of syncope x2 episodes  while sitting associated with low BP /dehydration  - symptoms does not appear to be cardiac associated, he is currently in atrial flutter 60s, asked ER nurse to interrogate his PPM today  - treat dehydration accordingly given orthostasis, stop losartan for 48 hours, ensure adequate PO intake, consider autonomic dysfunction - continue neurology work up on this dizziness/gait issue outpatient - there is a possibility of amiodarone neurotoxicity, which would manifest as dizziness/ataxia/tremor, although his symptoms started 2022 and amiodarone was started 10/2021, regardless, we can stop amiodarone and see if this makes any difference going forward, will request EP follow up for other options of atrial flutter management  - continue Xarelto for anticoagulation for atrial flutter  CAD, non-obstructive - Hs trop 8 >7  - he denied any chest pain - continue PTA Xarelto and crestor   HTN  HFpEF - EF preserved in 04/13/22 Echo  - clinically euvolemic today  - Hold losartan given orthostasis for 48 hours, re-evaluate BP,  discussed with the patient to not adjust BP meds based on single reading     Risk Assessment/Risk Scores:    New York Heart Association (NYHA) Functional Class NYHA Class II  CHA2DS2-VASc Score = 5   This indicates a 7.2% annual risk of stroke. The patient's score is based upon: CHF History: 1 HTN History: 1 Diabetes History: 0 Stroke History: 0 Vascular Disease History: 1 Age Score: 2 Gender Score: 0       For questions or updates, please contact [facility] Please consult [email or web address] for contact info under    Signed, Cyndi Bender, NP  09/05/2023 3:05 PM  Personally seen and examined. Agree with above.  82 year old with unexplained possible loss of consciousness.  Pacer is functioning well. EP Dr. Elberta Fortis on board.  Unable to clearly explain etiology. ? Dysautonomia.  Would not be unreasonable to stop amiodarone. Also should stop losartan. He was worried about higher BP's. I am willing to tolerate higher BP to ward off low BP (was 60/33 at home).   Does not sound like a true cardiac issue. 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  Donato Schultz, MD

## 2023-09-05 NOTE — Care Management Obs Status (Signed)
MEDICARE OBSERVATION STATUS NOTIFICATION   Patient Details  Name: Louis Barton MRN: [id number] Date of Birth: [birth year]   Medicare Observation Status Notification Given:  Yes    Oletta Cohn, RN 09/05/2023, 12:05 PM

## 2023-09-05 NOTE — Hospital Course (Addendum)
Louis Barton is a 82 y.o. 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    #Near Syncope #Hx of A flutter s/p pacemaker #Hx BPPV Presented with two days of dizziness, lightheadedness, near syncope, falls. No LOC, bowel or bladder incontinence, abnormal limb movements, tongue biting. He took losartan 50 mg on Thursday, Friday, Saturday after noting elevated blood pressure readings beginning of last week. He also took an x-ray dose of Lasix on Friday after noting he had ankle swelling. Also endorsing lightheadedness and dizziness with position changes, decreased p.o. intake for the past couple of days. He has history of symptomatic bradycardia with pacemaker, follows cardiology. Labs reassuring with no electrolyte abnormalities, some elevation in Cr 1.25 [0.96], normal WBC and Hgb. Normal UA. Etiology multifactorial, his symptoms likely precipitated with restarting his losartan 50 mg, taking it on Thurs/Fri/Sat, extra dose of Lasix on Fri, decreased p.o. intake resulting in near-syncope and a component of dysautonomia. He did have his pacemaker evaluated in the ED that showed intermittent underlying aflutter/afib. Cardiology consulted.  Appreciate cardiology recommendation at this time, they recommended stopping the amiodarone.  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  On 12/11, concerns for orthostatic hypotension, fluctuating blood pressures as patient change position, RN did message that while he was sitting on his bedside commode for bowel movement, he did have an episode of hypotension.  There is likely a vasovagal  component that is also playing a role in his near syncope.  We gave him a 500 cc lactated ringer bolus yesterday.  It responded well.  Otherwise patient will benefit physical and Occupational Therapy.   #Fall #Rib Fractures, L Ribs 10-11th Had a fall 12/8 at church after he stumbled. Imaging showed acute fractures of the anterior aspect of the left ninth and 10th rib.  Patient did endorse left lower leg pain, he was given Lidoderm patches and we scheduled Tylenol 1000 mg every 8 hours for his moderate pain.  He did report that when he took his Tylenol it brought his pain down from a 8-4.   Chronic conditions HLD: Continue home medication Crestor 40 mg and Colesevelam 1,250 mg BID  HFpEF: Does not appear volume overload at this time, has 1+ LLE pitting edema that is at baseline.  HTN: Losartan stopped last year per patient due to episodic dizziness. BP fluctuating with softer blood pressures 12/11, patient is given 500 cc LR bolus.  We also stopped his amiodarone per cardiology, Dr. Elberta Fortis will f/u OP and decide whether he needs to restart his amiodarone or not.  Otherwise patient is recommended good p.o. intake and physical therapy.  -----------------------------------------------------------------  [name], [name] came to the hospital for episodes of near syncope, dizziness, lightheadedness.  You were treated with fluids and we stopped your blood pressure and amiodarone medication.  We did have you work with physical and Occupational Therapy.  Cardiology came in and saw you as well.  They do not think it is your heart that is causing these episodes.  We think you will highly benefit from outpatient physical and Occupational Therapy.  For your near syncope/dizziness/lightheadedness -Please do not take any of your old blood pressure medications such as losartan. -Please  hold your Lasix for now, until you are seen by your primary care doctor. -Please hold amiodarone for now, until you are seen by  your EP doctor.  -Please hold the Exelon patch for now until you follow up with your Neurology doctor  For your ribs fracture -You can continue taking Tylenol 650 mg every 6-8 hours as needed for pain control -I am also sending you with Lidoderm patches, you can place a patch onto your skin for 12 hours and then remove it after 12 hours.  Otherwise he can continue taking your Crestor 40 mg and Colesevelam 1,250 mg BID.  If you have any of these following symptoms, please call us or seek care at an emergency department: -Chest Pain -Difficulty Breathing -Worsening abdominal pain -Syncope (passing out) -Drooping of face -Slurred speech -Sudden weakness in your leg or arm -Fever -Chills -blood in the stool -dark black, sticky stool  If you have any questions or concerns, call our clinic at [phone] or after hours call [phone] and ask for the internal medicine resident on call.  I am glad you are feeling better. It was a pleasure taking care for you. I wish a good recovery and good health!   Dr. Jeral Pinch   ------------------------------------------------------------------------------

## 2023-09-05 NOTE — ED Notes (Signed)
PT at bedside for evaluation.

## 2023-09-05 NOTE — Progress Notes (Signed)
OT Cancellation Note  Patient Details Name: ELISIAH SCHETTER MRN: [id number] DOB: [birth year]   Cancelled Treatment:    Reason Eval/Treat Not Completed: Other (comment) (pt with hypotension after working with PT.  Will follow up next date for OT evaluation.)  Carver Fila, OTD, OTR/L [facility] [phone]   Dalphine Handing 09/05/2023, 4:36 PM

## 2023-09-05 NOTE — Progress Notes (Addendum)
HD#0 SUBJECTIVE:  Patient Summary: Louis Barton is a 82 y.o. 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   Overnight Events: None   Interim History: Patient is eval at bedside today.  He continues to endorse left lower rib pain, reports that once he gets the Tylenol 1000 mg, his pain drops from 8-4.  Reports the pain gets worse when he moves.  The lidocaine patch helped a little.  Otherwise denies any chest pain or shortness of breath.  Denies any other episodes of dizziness or lightheadedness.  Denies any nausea or vomiting.  No other concerns at this time.  OBJECTIVE:  Vital Signs: Vitals:   09/05/23 0930 09/05/23 1000 09/05/23 1015 09/05/23 1332  BP: 115/71 138/89 (!) 153/84 104/75  Pulse: (!) 59 (!) 59 (!) 59 (!) 58  Resp: (!) 22 16 18 15   Temp:    98.2 F (36.8 C)  TempSrc:    Oral  SpO2: 100% 100% 100% 100%   Supplemental O2: Room Air SpO2: 100 %  There were no vitals filed for this visit.  No intake or output data in the 24 hours ending 09/05/23 1423 Net IO Since Admission: No IO data has been entered for this period [09/05/23 1423]  Physical Exam: Physical Exam  Constitutional: Laying in bed, no acute distress HENT: normocephalic atraumatic, mucous membranes moist Cardiovascular: regular rate and rhythm, no m/r/g, 1+ pitting ankle edema LLE Pulmonary/Chest: normal work of breathing on room air, lungs clear to auscultation bilaterally Abdominal: soft, non-tender, non-distended, bowel sounds present MSK: +Left lower ribs tenderness upon palpation  Neurological: alert & oriented x 3, 5/5 strength in bilateral upper  and lower extremities Skin: warm and dry Psych: normal mood and affect   Patient Lines/Drains/Airways Status     Active Line/Drains/Airways     Name Placement date Placement time Site Days   Peripheral IV 09/03/23 20 G Right Antecubital 09/03/23  1541  Antecubital  2   Peripheral IV 09/04/23 20 G Left;Posterior Hand 09/04/23  --  Hand  1             ASSESSMENT/PLAN:  Assessment: Principal Problem:   Near syncope  Louis Barton is a 82 y.o. 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    #Near Syncope #Hx of A flutter s/p pacemaker #Hx BPPV Presented with two days of dizziness, lightheadedness, near syncope, falls. No LOC, bowel or bladder incontinence, abnormal limb movements, tongue biting. He took losartan 50 mg on Thursday, Friday, Saturday after noting elevated blood pressure readings beginning of last week. He also took an x-ray dose of Lasix on Friday after noting he had ankle swelling. Also endorsing lightheadedness and dizziness with position changes, decreased p.o. intake for the past couple of days. He has history of symptomatic bradycardia with pacemaker, follows cardiology. Labs reassuring with no electrolyte abnormalities, some elevation in Cr 1.25 [0.96], normal WBC and Hgb. Normal UA. Etiology multifactorial,  his symptoms likely precipitated with restarting his losartan 50 mg, taking it on Thurs/Fri/Sat, extra dose of Lasix on Fri, decreased p.o. intake resulting in near-syncope. He did have his pacemaker evaluated in the ED that showed intermittent underlying aflutter/afib. Cardiology consulted.   Per evaluation, no  new concerns today. Labs are reassuring. Orthostatic vitals checked last night showed BP lying 137/80, sitting 105/78, standing 125/82. Patient needs to be evaluated by PT today and ambulate with assistance. Waiting on cardiology to evaluate the patient and see if any interventions need to be made. Family is considering SNF short term.    Plan: - Follow up cardiology recommendation - Orthostatic vitals today - Ambulate with assistance per nursing  - PT evaluation today  - Continue home medication Xarelto 20 mg daily - Continue home medication amiodarone 200 mg daily - Cardiac telemetry    #Fall #Rib Fractures, L Ribs 10-11th Had a fall 12/8 at church after he stumbled. Imaging showed acute fractures of the anterior aspect of the left ninth and 10th rib. He endorsed left lower ribs pain and left hip pain.  - Lidoderm patches  -Scheduled Tylenol 1000 mg every 8 hours for moderate severe pain.   Chronic conditions HLD: Continue home medication Crestor 40 mg and Colesevelam 1,250 mg BID  HFpEF: Does not appear volume overload at this time, has 1+ LLE pitting edema that is at baseline.  HTN: Losartan stopped last year per patient due to episodic dizziness.   Best Practice: Diet: Cardiac diet IVF: Fluids: None, Rate: None VTE: rivaroxaban (XARELTO) tablet 20 mg Start: 09/04/23 2215 Code: DNR/DNI AB: None Therapy Recs: Pending, DME: none Family Contact: Lucy, called and notified. DISPO: Anticipated discharge tomorrow to pending pending  medical management .  Signature: Jeral Pinch, D.O.  Internal Medicine Resident, PGY-1 [facility]  Pager: [phone] 2:23 PM, 09/05/2023   Please contact the on call pager after 5 pm and on weekends at [phone].

## 2023-09-06 DIAGNOSIS — S2242XA Multiple fractures of ribs, left side, initial encounter for closed fracture: Secondary | ICD-10-CM | POA: Diagnosis not present

## 2023-09-06 DIAGNOSIS — Z7901 Long term (current) use of anticoagulants: Secondary | ICD-10-CM | POA: Diagnosis not present

## 2023-09-06 DIAGNOSIS — R55 Syncope and collapse: Secondary | ICD-10-CM | POA: Diagnosis not present

## 2023-09-06 LAB — GLUCOSE, CAPILLARY: Glucose-Capillary: 133 mg/dL — ABNORMAL HIGH (ref 70–99)

## 2023-09-06 LAB — MRSA NEXT GEN BY PCR, NASAL: MRSA by PCR Next Gen: NOT DETECTED

## 2023-09-06 MED ORDER — COLESEVELAM HCL 625 MG PO TABS
1250.0000 mg | ORAL_TABLET | Freq: Two times a day (BID) | ORAL | Status: DC
  Administered 2023-09-07: 1250 mg via ORAL
  Filled 2023-09-06 (×3): qty 2

## 2023-09-06 MED ORDER — POLYETHYLENE GLYCOL 3350 17 G PO PACK
17.0000 g | PACK | Freq: Every day | ORAL | Status: DC
  Administered 2023-09-06: 17 g via ORAL
  Filled 2023-09-06: qty 1

## 2023-09-06 MED ORDER — LACTATED RINGERS IV BOLUS
500.0000 mL | Freq: Once | INTRAVENOUS | Status: AC
  Administered 2023-09-06: 500 mL via INTRAVENOUS

## 2023-09-06 NOTE — Progress Notes (Signed)
HD#1 SUBJECTIVE:  Patient Summary: Patient Summary: Louis Barton is a 82 y.o. 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    Overnight Events: None   Interim History: Patient is eval bedside today.  He denies any dizziness or lightheadedness at this time.  Denies any nausea or vomiting.  Denies any abdominal pain.  Denies any chest pain or shortness of breath.  Reports that the Tylenol has been helping him with the pain control of his left ribs.  OBJECTIVE:  Vital Signs: Vitals:   09/06/23 0004 09/06/23 0400 09/06/23 0700 09/06/23 1100  BP: (!) 158/89 122/86 (!) 147/84 106/66  Pulse: 60 (!) 59 60 60  Resp: 16 17 17 19   Temp:  98.3 F (36.8 C) 98.2 F (36.8 C) 98.2 F (36.8 C)  TempSrc:  Oral Oral Oral  SpO2: 97% 94% 94% 94%  Weight:      Height:       Supplemental O2: Room Air SpO2: 94 %  Filed Weights   09/05/23 2337  Weight: 71.2 kg     Intake/Output Summary (Last 24 hours) at 09/06/2023 1318 Last data filed at 09/06/2023 1139 Gross per 24 hour  Intake 240 ml  Output 975 ml  Net -735 ml   Net IO Since Admission: -735 mL [09/06/23 1318]  Physical Exam: Physical Exam  Constitutional: Laying in bed, no acute distress HENT: normocephalic atraumatic, mucous membranes moist Cardiovascular: regular rate and rhythm, no m/r/g, 1+ pitting ankle edema LLE Pulmonary/Chest: normal work of breathing on room air, lungs clear to auscultation bilaterally Abdominal: soft, non-tender, non-distended, bowel sounds present Psych: normal mood and affect  Patient Lines/Drains/Airways Status     Active Line/Drains/Airways      Name Placement date Placement time Site Days   Peripheral IV 09/03/23 20 G Right Antecubital 09/03/23  1541  Antecubital  3   Peripheral IV 09/04/23 20 G Left;Posterior Hand 09/04/23  --  Hand  2             ASSESSMENT/PLAN:  Assessment: Principal Problem:   Near syncope Active Problems:   Chronic anticoagulation   Multiple closed fractures of ribs of left side  Louis Barton is a 82 y.o. 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    #Near Syncope #Hx of A flutter s/p pacemaker #Hx BPPV Presented with two days of dizziness, lightheadedness, near syncope, falls. No LOC, bowel or bladder incontinence, abnormal limb movements, tongue biting. He took losartan 50 mg on Thursday, Friday, Saturday after noting elevated blood pressure readings beginning of last week. He also took an x-ray dose of Lasix on Friday after noting he had ankle swelling. Also endorsing lightheadedness and dizziness with position changes, decreased p.o. intake for the past couple of days. He has history of symptomatic bradycardia with pacemaker, follows cardiology. Labs reassuring with no electrolyte abnormalities, some elevation in Cr 1.25 [0.96], normal WBC and Hgb. Normal UA. Etiology multifactorial,  his symptoms likely precipitated with restarting his losartan 50 mg, taking it on Thurs/Fri/Sat, extra dose of Lasix on Fri, decreased p.o. intake resulting in near-syncope and a component of dysautonomia. He did have his pacemaker evaluated in the ED that showed intermittent underlying aflutter/afib. Cardiology consulted.    Per evaluation today, no new concerns  today.  Orthostatics recorded yesterday by physical therapy, supine 120/81, sitting 120/75, standing 109/67, standing after walk 79/61. RN messaged about fluctuating blood pressure readings when patient changes positions. He is given 500 cc LR bolus today.  Cardiology otherwise evaluated the patient today, appreciate their recommendations.  We have stopped amiodarone per cardiology recommendation.  Plan for a outpatient, cardiology follow-up. Otherwise continue PT/OT inpatient and pending SNF  Plan: - Pending SNF  - Orthostatic vitals  - Ambulate with assistance per nursing  - Continue PT/OT  - Continue home medication Xarelto 20 mg daily - STOP Amiodarone  - Cardiac telemetry    #Fall #Rib Fractures, L Ribs 10-11th Had a fall 12/8 at church after he stumbled. Imaging showed acute fractures of the anterior aspect of the left ninth and 10th rib. He endorsed left lower ribs pain and left hip pain.  - Lidoderm patches  - Scheduled Tylenol 1000 mg every 8 hours for moderate severe pain.   Chronic conditions HLD: Continue home medication Crestor 40 mg and Colesevelam 1,250 mg BID  HFpEF: Does not appear volume overload at this time, has 1+ LLE pitting edema that is at baseline.  HTN: Losartan stopped last year per patient due to episodic dizziness. BP fluctuating with softer blood pressures today, patient is given 500 cc LR bolus.  Will continue to evaluate patient's blood pressures.  Best Practice: Diet: Cardiac diet IVF: Fluids: None , Rate: None VTE: Xarelto 20 mg Code: DNR AB: None Therapy Recs: SNF, DME: none Family Contact: Lucy, called and notified. DISPO: Anticipated discharge  1-2 days  to Skilled nursing facility pending  TOC .  Signature: Jeral Pinch, D.O.  Internal Medicine Resident, PGY-1 [facility]  Pager: [phone] 1:18 PM, 09/06/2023   Please contact the on call pager after 5 pm and on weekends at [phone].

## 2023-09-06 NOTE — TOC Initial Note (Signed)
Transition of Care [facility]) - Initial/Assessment Note    Patient Details  Name: Louis Barton MRN: [id number] Date of Birth: [birth year]  Transition of Care [facility]) CM/SW Contact:    [name], LCSW Phone Number: 09/06/2023, 2:01 PM  Clinical Narrative:                  11:30AM This CSW introduced herself and role to patient at bedside. CSW informed patient of physical therapy recommendation of SNF. Patient confirmed that they accepted recommendation and plan to discharge to SNF. CSW informed patient that they would send referrals and inquired about preferences. Patient stated that he would prefer [facility] or SNF in [facility].   Expected Discharge Plan: Skilled Nursing Facility Barriers to Discharge: Continued Medical Work up, English as a second language teacher, SNF Pending bed offer   Patient Goals and CMS Choice Patient states their goals for this hospitalization and ongoing recovery are:: SNF   Choice offered to / list presented to : Patient      Expected Discharge Plan and Services In-house Referral: Clinical Social Work   Post Acute Care Choice: Skilled Nursing Facility Living arrangements for the past 2 months: Single Family Home                                      Prior Living Arrangements/Services Living arrangements for the past 2 months: Single Family Home Lives with:: Self Patient language and need for interpreter reviewed:: Yes Do you feel safe going back to the place where you live?: Yes      Need for Family Participation in Patient Care: Yes (Comment) Care giver support system in place?: Yes (comment)   Criminal Activity/Legal Involvement Pertinent to Current Situation/Hospitalization: No - Comment as needed  Activities of Daily Living   ADL Screening (condition at time of admission) Independently performs ADLs?: Yes (appropriate for developmental age) Is the patient deaf or have difficulty hearing?: No Does the patient have difficulty seeing, even  when wearing glasses/contacts?: No Does the patient have difficulty concentrating, remembering, or making decisions?: Yes  Permission Sought/Granted Permission sought to share information with : Family Supports, Oceanographer granted to share information with : Yes, Verbal Permission Granted  Share Information with NAME: Alden Server  Permission granted to share info w AGENCY: SNF  Permission granted to share info w Relationship: Daughter  Permission granted to share info w Contact Information: [phone]  Emotional Assessment Appearance:: Appears stated age Attitude/Demeanor/Rapport: Engaged Affect (typically observed): Accepting, Appropriate, Stable Orientation: : Oriented to Self, Oriented to Place, Oriented to  Time, Oriented to Situation Alcohol / Substance Use: Not Applicable Psych Involvement: No (comment)  Admission diagnosis:  Syncope and collapse [R55] Chronic anticoagulation [Z79.01] Near syncope [R55] Multiple falls [R29.6] Closed fracture of multiple ribs of left side, initial encounter [S22.42XA] Patient Active Problem List   Diagnosis Date Noted   Chronic anticoagulation 09/05/2023   Multiple closed fractures of ribs of left side 09/05/2023   Near syncope 09/04/2023   Paroxysmal atrial fibrillation (HCC) 12/23/2022   Gait abnormality 04/19/2022   Pacemaker 10/19/2021   Persistent atrial fibrillation (HCC) 08/11/2021   Secondary hypercoagulable state (HCC) 08/11/2021   Sprain of left ankle 05/21/2021   Heart block 05/05/2021   COVID-19 virus infection 05/05/2021   Acute encephalopathy 05/05/2021   Sinus bradycardia 05/05/2021   COVID 05/05/2021   Stroke (HCC) 05/04/2021   Chronic kidney disease 05/04/2021  Chronic heart failure with preserved ejection fraction (HCC) 03/31/2021   Mild cognitive impairment 03/31/2021   Typical atrial flutter (HCC) 03/19/2021   Hypertension    History of TIAs    History of hiatal hernia     Carotid stenosis    CAD (coronary artery disease)    Abnormal stress test    Malaise and fatigue 12/08/2020   Right bundle branch block 10/20/2020   First degree AV block 10/20/2020   Stage 3a chronic kidney disease (HCC) 06/17/2020   Ascending aortic aneurysm (HCC) 05/15/2020   Dizziness 05/06/2020   Shingles    History of thrombocytopenia    Dyspnea on exertion 04/02/2020   Edema of both lower extremities due to peripheral venous insufficiency 02/03/2020   Prediabetes 12/25/2018   Local edema 02/07/2018   Elevated tumor markers 12/15/2017    Class: Chronic   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) 12/08/2017    Class: History of   Right lower lobe pulmonary nodule 11/09/2017   Vitamin B12 deficiency 06/21/2017   Irritable bowel syndrome 11/17/2016   Solitary pulmonary nodule 11/16/2016   Renal cysts, acquired, bilateral 10/24/2016   Neuritis 10/24/2016   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   Benign paroxysmal positional vertigo 08/08/2016   Hyperlipidemia 11/16/2015   Arteriosclerosis of coronary artery 11/16/2015   Edema 11/16/2015   Coronary arteriosclerosis 11/16/2015   Degeneration of lumbar intervertebral disc 11/16/2015   Paralabral cyst of hip 11/16/2015   Benign neoplasm of colon 11/16/2015   Thrombocytopenia (HCC) 11/16/2015   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   Erectile dysfunction 11/16/2015   Cyst, meninges, spinal 11/16/2015   Atypical chest pain 06/11/2015   Symptomatic bradycardia 03/18/2015   Dyslipidemia 03/18/2015   PAD (peripheral artery disease) (HCC) 03/18/2015   Cramps of left lower extremity-Calf  > right calf 01/01/2015   Swelling of limb-Left foot 01/01/2015   Preop cardiovascular exam 04/14/2011   PCP:  Gordan Payment., MD Pharmacy:   [facility] [place], [place] - [address] [place] [address] Phone: [phone] Fax: [phone]  [id number] - [address], [place] - [address] [place] [address] Phone: [phone] Fax: [phone]     Social Determinants of Health (SDOH) Social History: SDOH Screenings   Food Insecurity: No Food Insecurity (09/05/2023)  Housing: Low Risk  (09/05/2023)  Transportation Needs: No Transportation Needs (09/05/2023)  Utilities: Not At Risk (09/05/2023)  Tobacco Use: Medium Risk (09/05/2023)   SDOH Interventions:     Readmission Risk Interventions     No data to display

## 2023-09-06 NOTE — Plan of Care (Signed)

## 2023-09-06 NOTE — Evaluation (Signed)
Occupational Therapy Evaluation Patient Details Name: Louis Barton MRN: [id number] DOB: [birth year] Today's Date: 09/06/2023   History of Present Illness Pt is 82 yo male presenting on 09/04/23 with near syncopal episodes for further work up.  Pt with recent falls.  Found to have L 9th/10th rib fx.  Pt with hx including but not limited to malignant lung CA, HLD, BPPV, symptomatic bradycardia with pacemaker, CVA, ascending aortic aneurysm, CKD, PAD, aflutter, OA, HF   Clinical Impression   PTA, pt lived at home alone and was mod I for ADL and IADL. Upon eval, pt with decreased strength, cardiopulmonary endurance, safety, balance, and health literacy. Pt reporting he "put himself back on his BP medications a few days ago". Pt requiring set-up A for UB ADL and mod A for LB ADL. Pt unable to stand today secondary to dizziness and low MAP with transition to sitting. OT to continue to follow. Patient will benefit from continued inpatient follow up therapy, <3 hours/day.       If plan is discharge home, recommend the following: A lot of help with walking and/or transfers;A lot of help with bathing/dressing/bathroom;Assistance with cooking/housework;Assist for transportation;Help with stairs or ramp for entrance;Direct supervision/assist for financial management;Direct supervision/assist for medications management    Functional Status Assessment  Patient has had a recent decline in their functional status and demonstrates the ability to make significant improvements in function in a reasonable and predictable amount of time.  Equipment Recommendations  Other (comment) (defer)    Recommendations for Other Services       Precautions / Restrictions Precautions Precautions: Fall Precaution Comments: orthostatic hypotension      Mobility Bed Mobility Overal bed mobility: Needs Assistance Bed Mobility: Supine to Sit, Sit to Supine     Supine to sit: Min assist Sit to supine: Min assist    General bed mobility comments: for tuncal elevation and bringing BLE bak into bed. Attempted to have pt log roll but pt refused    Transfers Overall transfer level: Needs assistance                 General transfer comment: deferred secondary to MAP of 57 at EOB and pt request return to supine reporting onset sweat behind ears, body feeling heavy, etc.      Balance Overall balance assessment: Needs assistance Sitting-balance support: No upper extremity supported Sitting balance-Leahy Scale: Good                                     ADL either performed or assessed with clinical judgement   ADL Overall ADL's : Needs assistance/impaired Eating/Feeding: Modified independent;Bed level   Grooming: Set up;Bed level   Upper Body Bathing: Sitting;Supervision/ safety   Lower Body Bathing: Moderate assistance;Sitting/lateral leans   Upper Body Dressing : Set up;Sitting   Lower Body Dressing: Moderate assistance;Sitting/lateral leans     Toilet Transfer Details (indicate cue type and reason): deferred secondary to low BP                 Vision Baseline Vision/History: 1 Wears glasses Ability to See in Adequate Light: 0 Adequate Patient Visual Report: No change from baseline Vision Assessment?: No apparent visual deficits Additional Comments: using phone on arrival     Perception Perception: Not tested       Praxis Praxis: Not tested       Pertinent Vitals/Pain Pain Assessment Pain Assessment:  Faces Faces Pain Scale: Hurts little more Pain Location: L ribs with transfers Pain Descriptors / Indicators: Discomfort, Grimacing Pain Intervention(s): Limited activity within patient's tolerance, Monitored during session     Extremity/Trunk Assessment     Lower Extremity Assessment Lower Extremity Assessment: Overall WFL for tasks assessed   Cervical / Trunk Assessment Cervical / Trunk Assessment: Normal   Communication  Communication Communication: No apparent difficulties   Cognition Arousal: Alert Behavior During Therapy: WFL for tasks assessed/performed Overall Cognitive Status: No family/caregiver present to determine baseline cognitive functioning                                 General Comments: Pt oriented and preoccupied with activities on his phone with OT first arriving. Pt with potential poor health literacy reporting he self started back on his BP medication a few days ago after his PCP had taken him off of it reporting that PCP did not listen that he was dizzy? Pt verbose and with limited pause to listen to commands and education from OT; although pleasant     General Comments  BP 97/69 (77) HR 61 supine; EOB 96/40 (57) HR 63; return to supine 100/73 (81) HR 61    Exercises     Shoulder Instructions      Home Living Family/patient expects to be discharged to:: Private residence Living Arrangements: Alone Available Help at Discharge: Family;Available PRN/intermittently Type of Home: House Home Access: Stairs to enter Entrance Stairs-Number of Steps: 1 small   Home Layout: One level     Bathroom Shower/Tub: Chief Strategy Officer: Standard     Home Equipment: Agricultural consultant (2 wheels);Rollator (4 wheels);Tub bench;BSC/3in1          Prior Functioning/Environment Prior Level of Function : Independent/Modified Independent;Driving             Mobility Comments: could ambulate in community without AD; Pt has had 2 falls and 1 near fall in the last week.  States has shuffle pattern at baseline ADLs Comments: independent with adls and light iadls        OT Problem List: Decreased strength;Decreased activity tolerance;Impaired balance (sitting and/or standing);Decreased cognition;Decreased safety awareness;Decreased knowledge of use of DME or AE;Cardiopulmonary status limiting activity      OT Treatment/Interventions: Self-care/ADL  training;Therapeutic exercise;DME and/or AE instruction;Patient/family education;Balance training;Therapeutic activities    OT Goals(Current goals can be found in the care plan section) Acute Rehab OT Goals Patient Stated Goal: go to rehab OT Goal Formulation: With patient Time For Goal Achievement: 09/20/23 Potential to Achieve Goals: Good  OT Frequency: Min 1X/week    Co-evaluation              AM-PAC OT "6 Clicks" Daily Activity     Outcome Measure Help from another person eating meals?: None Help from another person taking care of personal grooming?: A Little Help from another person toileting, which includes using toliet, bedpan, or urinal?: A Little Help from another person bathing (including washing, rinsing, drying)?: A Lot Help from another person to put on and taking off regular upper body clothing?: A Little Help from another person to put on and taking off regular lower body clothing?: A Lot 6 Click Score: 17   End of Session Equipment Utilized During Treatment: Gait belt;Rolling walker (2 wheels) Nurse Communication: Mobility status  Activity Tolerance: Patient tolerated treatment well Patient left: in bed;with call bell/phone within reach;with bed  alarm set  OT Visit Diagnosis: Unsteadiness on feet (R26.81);Muscle weakness (generalized) (M62.81);Other symptoms and signs involving cognitive function                Time: 8756-4332 OT Time Calculation (min): 40 min Charges:  OT General Charges $OT Visit: 1 Visit OT Evaluation $OT Eval Moderate Complexity: 1 Mod OT Treatments $Therapeutic Activity: 23-37 mins  Tyler Deis, OTR/L [facility] Acute Rehabilitation Office: [phone]   Myrla Halsted 09/06/2023, 11:06 AM

## 2023-09-06 NOTE — [place] FL2 (Signed)
[place] MEDICAID FL2 LEVEL OF CARE FORM     IDENTIFICATION  Patient Name: Louis Barton Birthdate: 1941/09/25 Sex: male Admission Date (Current Location): 09/04/2023  [facility] and [place] Number:  [employer] and Address:  [facility] [place], [address], [place], [place] [address]      Provider Number: [id number]  Attending Physician Name and Address:  Gust Rung, DO  Relative Name and Phone Number:  Louis Barton; Daughter; [phone]    Current Level of Care: Hospital Recommended Level of Care: Skilled Nursing Facility Prior Approval Number:    Date Approved/Denied:   PASRR Number: [id number]  Discharge Plan: SNF    Current Diagnoses: Patient Active Problem List   Diagnosis Date Noted   Chronic anticoagulation 09/05/2023   Multiple closed fractures of ribs of left side 09/05/2023   Near syncope 09/04/2023   Paroxysmal atrial fibrillation (HCC) 12/23/2022   Gait abnormality 04/19/2022   Pacemaker 10/19/2021   Persistent atrial fibrillation (HCC) 08/11/2021   Secondary hypercoagulable state (HCC) 08/11/2021   Sprain of left ankle 05/21/2021   Heart block 05/05/2021   COVID-19 virus infection 05/05/2021   Acute encephalopathy 05/05/2021   Sinus bradycardia 05/05/2021   COVID 05/05/2021   Stroke (HCC) 05/04/2021   Chronic kidney disease 05/04/2021   Chronic heart failure with preserved ejection fraction (HCC) 03/31/2021   Mild cognitive impairment 03/31/2021   Typical atrial flutter (HCC) 03/19/2021   Hypertension    History of TIAs    History of hiatal hernia    Carotid stenosis    CAD (coronary artery disease)    Abnormal stress test    Malaise and fatigue 12/08/2020   Right bundle branch block 10/20/2020   First degree AV block 10/20/2020   Stage 3a chronic kidney disease (HCC) 06/17/2020   Ascending aortic aneurysm (HCC) 05/15/2020   Dizziness 05/06/2020   Shingles    History of thrombocytopenia    Dyspnea on  exertion 04/02/2020   Edema of both lower extremities due to peripheral venous insufficiency 02/03/2020   Prediabetes 12/25/2018   Local edema 02/07/2018   Elevated tumor markers 12/15/2017   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) 12/08/2017   Right lower lobe pulmonary nodule 11/09/2017   Vitamin B12 deficiency 06/21/2017   Irritable bowel syndrome 11/17/2016   Solitary pulmonary nodule 11/16/2016   Renal cysts, acquired, bilateral 10/24/2016   Neuritis 10/24/2016   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   Benign paroxysmal positional vertigo 08/08/2016   Hyperlipidemia 11/16/2015   Arteriosclerosis of coronary artery 11/16/2015   Edema 11/16/2015   Coronary arteriosclerosis 11/16/2015   Degeneration of lumbar intervertebral disc 11/16/2015   Paralabral cyst of hip 11/16/2015   Benign neoplasm of colon 11/16/2015   Thrombocytopenia (HCC) 11/16/2015   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   Erectile dysfunction 11/16/2015   Cyst, meninges, spinal 11/16/2015   Atypical chest pain 06/11/2015   Symptomatic bradycardia 03/18/2015   Dyslipidemia 03/18/2015   PAD (peripheral artery disease) (HCC) 03/18/2015   Cramps of left lower extremity-Calf  > right calf 01/01/2015   Swelling of limb-Left foot 01/01/2015   Preop cardiovascular exam 04/14/2011    Orientation RESPIRATION BLADDER Height & Weight     Self, Time, Situation, Place  Normal (Room Air) Continent Weight: 156 lb 15.5 oz (71.2 kg) Height:  5\' 8"  (172.7 cm)  BEHAVIORAL SYMPTOMS/MOOD NEUROLOGICAL BOWEL NUTRITION STATUS      Continent Diet (please see dc summary)  AMBULATORY STATUS COMMUNICATION OF NEEDS Skin  Limited Assist Verbally Normal                       Personal Care Assistance Level of Assistance  Bathing, Feeding, Dressing Bathing Assistance: Limited assistance Feeding assistance: Limited assistance Dressing Assistance: Limited assistance     Functional Limitations Info  Sight Sight Info: Impaired (R and L  (eyeglasses))        SPECIAL CARE FACTORS FREQUENCY  PT (By licensed PT), OT (By licensed OT)     PT Frequency: 5x OT Frequency: 5x            Contractures Contractures Info: Not present    Additional Factors Info  Code Status, Allergies Code Status Info: DNR- Limited- Do Not Intubate/DNI Allergies Info: NKA           Current Medications (09/06/2023):  This is the current hospital active medication list Current Facility-Administered Medications  Medication Dose Route Frequency Provider Last Rate Last Admin   acetaminophen (TYLENOL) tablet 1,000 mg  1,000 mg Oral Q8H Tawkaliyar, Roya, DO   1,000 mg at 09/06/23 0616   colesevelam [facility]) tablet 1,250 mg  1,250 mg Oral BID WC Reome, Earle J, RPH       lidocaine (LIDODERM) 5 % 2 patch  2 patch Transdermal Q24H Rana Snare, DO   2 patch at 09/05/23 2348   polyethylene glycol (MIRALAX / GLYCOLAX) packet 17 g  17 g Oral Daily Tawkaliyar, Roya, DO       rivaroxaban (XARELTO) tablet 20 mg  20 mg Oral Q supper Rana Snare, DO   20 mg at 09/05/23 2218   rosuvastatin (CRESTOR) tablet 40 mg  40 mg Oral QHS Rana Snare, DO   40 mg at 09/05/23 2218   senna-docusate (Senokot-S) tablet 1 tablet  1 tablet Oral QHS PRN Rana Snare, DO         Discharge Medications: Please see discharge summary for a list of discharge medications.  Relevant Imaging Results:  Relevant Lab Results:   Additional Information SS#: [id number]  [name], LCSW

## 2023-09-06 NOTE — Progress Notes (Signed)
Paged by RN for decrease mentation and near-syncope while patient was on bedside commode. Upon arrival to the room, patient was laying supine, in a trendelenburg position, BP 136/69, 97% O2 on RA, HR 60, EKG showing NSR.  Patient is alert and oriented to self, date of birth, location, month, event.  He reported that during these episodes, he feels very weak and has blurry vision.  He denies any other symptoms, no chest pain shortness of breath.  No nausea or vomiting.  No headaches.  Per the physical exam, regular rate, regular rhythm, no murmurs rubs or gallops.  Lungs are clear to auscultation.  No abdominal tenderness noted.  Has a 1+ left ankle edema that was present at baseline.  He has left 10-11 anterior lateral ribs tenderness upon palpation. Recycled BP 122/70 (87) in trendelenberg.  Recycle BP while laying supine 121/74 (90), HR 60, saturating above >96%. There is a component of vasovagal that is playing a role in his near syncope. We will monitor him periodically, and if needed will give another 500 cc bolus LR.

## 2023-09-06 NOTE — Progress Notes (Signed)
   Patient Name: WINFERD COLGIN Date of Encounter: 09/06/2023 [facility] Cardiologist: Gypsy Balsam, MD   Interval Summary  .    Comfortable, no complaints  Vital Signs .    Vitals:   09/05/23 2337 09/05/23 2352 09/06/23 0004 09/06/23 0400  BP: (!) 160/102 (!) 146/88 (!) 158/89 122/86  Pulse: 62 60 60 (!) 59  Resp: 15 17 16 17   Temp: 98.2 F (36.8 C)   98.3 F (36.8 C)  TempSrc: Oral   Oral  SpO2: 96% 98% 97% 94%  Weight: 71.2 kg     Height: 5\' 8"  (1.727 m)       Intake/Output Summary (Last 24 hours) at 09/06/2023 0745 Last data filed at 09/06/2023 0540 Gross per 24 hour  Intake --  Output 850 ml  Net -850 ml      09/05/2023   11:37 PM 09/03/2023    2:22 PM 09/03/2023   12:56 PM  Last 3 Weights  Weight (lbs) 156 lb 15.5 oz 158 lb 15.2 oz 159 lb  Weight (kg) 71.2 kg 72.1 kg 72.122 kg      Telemetry/ECG    Atrial flutter underlying, normal ventricular response, no pauses- Personally Reviewed Echocardiogram normal EF July 2023 Physical Exam .   GEN: No acute distress.   Neck: No JVD Cardiac: Occasional ectopy, RRR, no murmurs, rubs, or gallops.  Respiratory: Clear to auscultation bilaterally. GI: Soft, nontender, non-distended  MS: No edema  Assessment & Plan .     82 year old with longstanding unexplained etiology for dizziness/syncopal type episodes.  For instance had slumped over on table can still hear somewhat conversations but unable to act on these.  Telemetry unremarkable overnight.  Atrial flutter noted underneath.  Pacemaker has been functioning normally.  Has been seen by EP service in the past, this issue has been discussed.  Neurology has seen as well.  My suggestion was to stop amiodarone and also to stop losartan.   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  Daughter was able to obtain blood pressure of 60/33 at home during 1 of these events.  I do not think that these are cardiac in etiology.   Willing to tolerate increased blood pressures to help ward off significantly low blood pressures during episodes of dysautonomia.  We will go ahead and sign off.  Please let us know if we can be of further assistance.  For questions or updates, please contact [facility] Please consult [email or web address] for contact info under        Signed, Donato Schultz, MD

## 2023-09-07 DIAGNOSIS — I639 Cerebral infarction, unspecified: Secondary | ICD-10-CM | POA: Insufficient documentation

## 2023-09-07 DIAGNOSIS — I509 Heart failure, unspecified: Secondary | ICD-10-CM | POA: Insufficient documentation

## 2023-09-07 DIAGNOSIS — S2249XA Multiple fractures of ribs, unspecified side, initial encounter for closed fracture: Secondary | ICD-10-CM | POA: Insufficient documentation

## 2023-09-07 DIAGNOSIS — I10 Essential (primary) hypertension: Secondary | ICD-10-CM | POA: Insufficient documentation

## 2023-09-07 DIAGNOSIS — I739 Peripheral vascular disease, unspecified: Secondary | ICD-10-CM | POA: Insufficient documentation

## 2023-09-07 DIAGNOSIS — D531 Other megaloblastic anemias, not elsewhere classified: Secondary | ICD-10-CM | POA: Insufficient documentation

## 2023-09-07 DIAGNOSIS — R2681 Unsteadiness on feet: Secondary | ICD-10-CM | POA: Insufficient documentation

## 2023-09-07 DIAGNOSIS — R55 Syncope and collapse: Secondary | ICD-10-CM | POA: Insufficient documentation

## 2023-09-07 DIAGNOSIS — S2242XA Multiple fractures of ribs, left side, initial encounter for closed fracture: Secondary | ICD-10-CM | POA: Diagnosis not present

## 2023-09-07 DIAGNOSIS — Z7901 Long term (current) use of anticoagulants: Secondary | ICD-10-CM | POA: Diagnosis not present

## 2023-09-07 DIAGNOSIS — D491 Neoplasm of unspecified behavior of respiratory system: Secondary | ICD-10-CM | POA: Insufficient documentation

## 2023-09-07 DIAGNOSIS — M6281 Muscle weakness (generalized): Secondary | ICD-10-CM | POA: Insufficient documentation

## 2023-09-07 DIAGNOSIS — W19XXXA Unspecified fall, initial encounter: Secondary | ICD-10-CM | POA: Insufficient documentation

## 2023-09-07 LAB — BASIC METABOLIC PANEL
Anion gap: 7 (ref 5–15)
BUN: 14 mg/dL (ref 8–23)
CO2: 23 mmol/L (ref 22–32)
Calcium: 8.2 mg/dL — ABNORMAL LOW (ref 8.9–10.3)
Chloride: 107 mmol/L (ref 98–111)
Creatinine, Ser: 1.18 mg/dL (ref 0.61–1.24)
GFR, Estimated: >60 mL/min (ref >60)
Glucose, Bld: 105 mg/dL — ABNORMAL HIGH (ref 70–99)
Potassium: 4 mmol/L (ref 3.5–5.1)
Sodium: 137 mmol/L (ref 135–145)

## 2023-09-07 MED ORDER — POLYVINYL ALCOHOL 1.4 % OP SOLN: 2.0000 [drp] | OPHTHALMIC | Status: DC | PRN

## 2023-09-07 MED ORDER — RIVAROXABAN 15 MG PO TABS
15.0000 mg | ORAL_TABLET | Freq: Every day | ORAL | Status: DC
Start: 2023-09-07 — End: 2023-09-07
  Filled 2023-09-07: qty 1

## 2023-09-07 MED ORDER — RIVAROXABAN 15 MG PO TABS: 15.0000 mg | ORAL_TABLET | Freq: Every day | ORAL | 3 refills | Status: DC

## 2023-09-07 MED ORDER — LIDOCAINE 5 % EX PTCH: 2.0000 | MEDICATED_PATCH | CUTANEOUS | Status: DC

## 2023-09-07 NOTE — Progress Notes (Signed)
Physical Therapy Treatment Patient Details Name: Louis Barton MRN: [id number] DOB: Sep 07, 1941 Today's Date: 09/07/2023   History of Present Illness Pt is 82 yo male presenting on 09/04/23 with near syncopal episodes for further work up.  Pt with recent falls.  Found to have L 9th/10th rib fx.  Pt with hx including but not limited to malignant lung CA, HLD, BPPV, symptomatic bradycardia with pacemaker, CVA, ascending aortic aneurysm, CKD, PAD, aflutter, OA, HF    PT Comments  Pt is slowly progressing towards goals. Pt has been limited due to orthostatic hypotension. Pt demonstrates a drop in systolic and diastolic BP without symptoms after tranfser BSC> EOB. Pt is supervision for bed mobility with increased time and Min a for transfers with HHA. Due to pt current functional status, home set up and available assistance at home recommending skilled physical therapy services < 3 hours/day on discharge from acute care hospital setting in order to address strength, balance and functional mobility to decrease risk for falls, injury and re-hospitalization. Pt tolerated treatment session well.    If plan is discharge home, recommend the following: A little help with walking and/or transfers;A little help with bathing/dressing/bathroom;Assistance with cooking/housework;Help with stairs or ramp for entrance     Equipment Recommendations  None recommended by PT       Precautions / Restrictions Precautions Precautions: Fall Precaution Comments: orthostatic hypotension     Mobility  Bed Mobility Overal bed mobility: Needs Assistance Bed Mobility: Supine to Sit, Sit to Supine     Supine to sit: Supervision Sit to supine: Supervision   General bed mobility comments: increased time and supervision    Transfers Overall transfer level: Needs assistance Equipment used: 1 person hand held assist (and arm rail on bedside commode) Transfers: Sit to/from Stand, Bed to chair/wheelchair/BSC Sit to Stand:  Min assist   Step pivot transfers: Min assist       General transfer comment: Min A with short steps and increased time for transfer from EOB <> BSC monitoring BP    Ambulation/Gait   Pre-gait activities: Pt took steps from EOB to [facility] at Min A with very short uneven steps and low foot clearance. General Gait Details: deferred at this time due to syncopal episode yesterday and bed rest orders today; per MD okay for PT to work with pt while on bedrest orders.       Balance Overall balance assessment: Needs assistance Sitting-balance support: Bilateral upper extremity supported Sitting balance-Leahy Scale: Good     Standing balance support: Bilateral upper extremity supported, Reliant on assistive device for balance, During functional activity Standing balance-Leahy Scale: Poor Standing balance comment: Requires Min A for balance during functional mobility      Cognition Arousal: Alert Behavior During Therapy: WFL for tasks assessed/performed Overall Cognitive Status: Within Functional Limits for tasks assessed           General Comments General comments (skin integrity, edema, etc.): BP sitting BSC 138/84. BP right after transfer from [facility] to EOB 119/64      Pertinent Vitals/Pain Pain Assessment Pain Assessment: Faces Faces Pain Scale: Hurts little more Pain Location: L ribs with transfers Pain Descriptors / Indicators: Discomfort, Grimacing Pain Intervention(s): Monitored during session     PT Goals (current goals can now be found in the care plan section) Acute Rehab PT Goals Patient Stated Goal: open to short term rehab PT Goal Formulation: With patient/family Time For Goal Achievement: 09/19/23 Potential to Achieve Goals: Good Progress towards PT goals: Progressing  toward goals    Frequency    Min 1X/week      PT Plan  Continue with current POC       AM-PAC PT "6 Clicks" Mobility   Outcome Measure  Help needed turning from your back to your  side while in a flat bed without using bedrails?: A Little Help needed moving from lying on your back to sitting on the side of a flat bed without using bedrails?: A Little Help needed moving to and from a bed to a chair (including a wheelchair)?: A Little Help needed standing up from a chair using your arms (e.g., wheelchair or bedside chair)?: A Little Help needed to walk in hospital room?: A Lot Help needed climbing 3-5 steps with a railing? : A Lot 6 Click Score: 16    End of Session Equipment Utilized During Treatment: Gait belt (over fx ribs) Activity Tolerance: Treatment limited secondary to medical complications (Comment) Patient left: in bed;with call bell/phone within reach Nurse Communication: Mobility status PT Visit Diagnosis: Other abnormalities of gait and mobility (R26.89);Muscle weakness (generalized) (M62.81);History of falling (Z91.81)     Time: 7253-6644 PT Time Calculation (min) (ACUTE ONLY): 23 min  Charges:    $Therapeutic Activity: 23-37 mins PT General Charges $$ ACUTE PT VISIT: 1 Visit                    Harrel Carina, DPT, CLT  Acute Rehabilitation Services Office: [phone] (Secure chat preferred)    Claudia Desanctis 09/07/2023, 9:13 AM

## 2023-09-07 NOTE — TOC Progression Note (Addendum)
Transition of Care [facility]) - Progression Note    Patient Details  Name: Louis Barton MRN: [id number] Date of Birth: [birth year]  [facility]) CM/SW Contact  [name], LCSW Phone Number: 09/07/2023, 11:47 AM  Clinical Narrative:     11:47 AM CSW was informed by Starleen Arms that SNF contacted family regarding bed offer. CSW returned to patient's bedside to confirm information. Patient's daughter and wife were present at bedside. Patient consented CSW to speak in front of family members. Patient and family confirmed interest in [facility]. CSW confirmed with patient, patient's family, and patient's medical team that he is able to discharge today. CSW offered patient and family [facility] Medicare SNF ratings, which they declined. CSW inquired patient and family about transportation. CSW informed patient and family of possible copay costs of ambulance transportation. Patient and patient's family expressed understanding of this and consented to ambulance transportation for discharge to [facility]  today.  12:49 PM CSW coordinated PTAR and relayed ETA to patient and patient's daughter at bedside.  Expected Discharge Plan: Skilled Nursing Facility Barriers to Discharge: Continued Medical Work up, English as a second language teacher, SNF Pending bed offer  Expected Discharge Plan and Services In-house Referral: Clinical Social Work   Post Acute Care Choice: Skilled Nursing Facility Living arrangements for the past 2 months: Single Family Home                                       Social Determinants of Health (SDOH) Interventions SDOH Screenings   Food Insecurity: No Food Insecurity (09/05/2023)  Housing: Low Risk  (09/05/2023)  Transportation Needs: No Transportation Needs (09/05/2023)  Utilities: Not At Risk (09/05/2023)  Tobacco Use: Medium Risk (09/05/2023)    Readmission Risk Interventions     No data to display

## 2023-09-07 NOTE — TOC Transition Note (Signed)
Transition of Care [facility]) - Discharge Note   Patient Details  Name: Louis Barton MRN: [id number] Date of Birth: March 17, 1941  Transition of Care [facility]) CM/SW Contact:  [name], LCSW Phone Number: 09/07/2023, 12:55 PM   Clinical Narrative:     Patient will DC to: [facility]  Anticipated DC date: 09/07/2023 Family notified: Alden Server; Daughter; [phone] Transport by: Sharin Mons   Per MD patient ready for DC to [facility] [address]. RN to call report prior to discharge [phone]). RN, patient, patient's family, and facility notified of DC. Discharge Summary and FL2 sent to facility. DC packet on chart. Ambulance transport requested for patient.   CSW will sign off for now as social work intervention is no longer needed. Please consult [place] again if new needs arise.    Final next level of care: Skilled Nursing Facility Barriers to Discharge: Barriers Resolved   Patient Goals and CMS Choice Patient states their goals for this hospitalization and ongoing recovery are:: SNF CMS Medicare.gov Compare Post Acute Care list provided to:: Patient Choice offered to / list presented to : Patient      Discharge Placement              Patient chooses bed at: [facility],  Patient to be transferred to facility by: PTAR Name of family member notified: Alden Server; Daughter; [phone] Patient and family notified of of transfer: 09/07/23  Discharge Plan and Services Additional resources added to the After Visit Summary for   In-house Referral: Clinical Social Work   Post Acute Care Choice: Skilled Nursing Facility                               Social Drivers of Health (SDOH) Interventions SDOH Screenings   Food Insecurity: No Food Insecurity (09/05/2023)  Housing: Low Risk  (09/05/2023)  Transportation Needs: No Transportation Needs (09/05/2023)  Utilities: Not At Risk (09/05/2023)  Tobacco Use: Medium Risk (09/05/2023)     Readmission Risk  Interventions     No data to display

## 2023-09-07 NOTE — Plan of Care (Signed)
  Problem: Education: Goal: Knowledge of General Education information will improve Description: Including pain rating scale, medication(s)/side effects and non-pharmacologic comfort measures Outcome: Adequate for Discharge   Problem: Health Behavior/Discharge Planning: Goal: Ability to manage health-related needs will improve Outcome: Adequate for Discharge   Problem: Clinical Measurements: Goal: Ability to maintain clinical measurements within normal limits will improve Outcome: Adequate for Discharge Goal: Will remain free from infection Outcome: Adequate for Discharge Goal: Diagnostic test results will improve Outcome: Adequate for Discharge Goal: Respiratory complications will improve Outcome: Adequate for Discharge Goal: Cardiovascular complication will be avoided Outcome: Adequate for Discharge   Problem: Activity: Goal: Risk for activity intolerance will decrease Outcome: Adequate for Discharge   Problem: Nutrition: Goal: Adequate nutrition will be maintained Outcome: Adequate for Discharge   Problem: Coping: Goal: Level of anxiety will decrease Outcome: Adequate for Discharge   Problem: Elimination: Goal: Will not experience complications related to bowel motility Outcome: Adequate for Discharge Goal: Will not experience complications related to urinary retention Outcome: Adequate for Discharge   Problem: Pain Management: Goal: General experience of comfort will improve Outcome: Adequate for Discharge   Problem: Safety: Goal: Ability to remain free from injury will improve Outcome: Adequate for Discharge   Problem: Skin Integrity: Goal: Risk for impaired skin integrity will decrease Outcome: Adequate for Discharge   Problem: Education: Goal: Knowledge of condition and prescribed therapy will improve Outcome: Adequate for Discharge   Problem: Cardiac: Goal: Will achieve and/or maintain adequate cardiac output Outcome: Adequate for Discharge    Problem: Physical Regulation: Goal: Complications related to the disease process, condition or treatment will be avoided or minimized Outcome: Adequate for Discharge

## 2023-09-07 NOTE — Discharge Instructions (Signed)
Mr. Farhad, Criner came to the hospital for episodes of near syncope, dizziness, lightheadedness.  You were treated with fluids and we stopped your blood pressure and amiodarone medication.  We did have you work with physical and Occupational Therapy.  Cardiology came in and saw you as well.  They do not think it is your heart that is causing these episodes.  We think you will highly benefit from outpatient physical and Occupational Therapy.  For your near syncope/dizziness/lightheadedness -Please do not take any of your old blood pressure medications such as losartan. -Please hold your Lasix for now, until you are seen by your primary care doctor. -Please hold amiodarone for now, until you are seen by your EP doctor.  -Please hold the Exelon patch for now until you follow up with your Neurology doctor  For your ribs fracture -You can continue taking Tylenol 650 mg every 6-8 hours as needed for pain control -I am also sending you with Lidoderm patches, you can place a patch onto your skin for 12 hours and then remove it after 12 hours.  Otherwise he can continue taking your Crestor 40 mg and Colesevelam 1,250 mg BID.  If you have any of these following symptoms, please call us or seek care at an emergency department: -Chest Pain -Difficulty Breathing -Worsening abdominal pain -Syncope (passing out) -Drooping of face -Slurred speech -Sudden weakness in your leg or arm -Fever -Chills -blood in the stool -dark black, sticky stool  If you have any questions or concerns, call our clinic at [phone] or after hours call [phone] and ask for the internal medicine resident on call.  I am glad you are feeling better. It was a pleasure taking care for you. I wish a good recovery and good health!   Dr. Jeral Pinch

## 2023-09-07 NOTE — Discharge Summary (Addendum)
Name: Louis Barton MRN: [id number] DOB: July 02, 1941 82 y.o. PCP: Gordan Payment., MD  Date of Admission: 09/04/2023 11:31 AM Date of Discharge:  09/04/2023  Attending Physician: Dr. Cleda Daub  DISCHARGE DIAGNOSIS:  Primary Problem: Near syncope   Hospital Problems: Principal Problem:   Near syncope Active Problems:   Chronic anticoagulation   Multiple closed fractures of ribs of left side    DISCHARGE MEDICATIONS:   Allergies as of 09/07/2023   No Known Allergies      Medication List     STOP taking these medications    amiodarone 200 MG tablet Commonly known as: PACERONE   furosemide 20 MG tablet Commonly known as: LASIX   rivastigmine 4.6 mg/24hr Commonly known as: EXELON       TAKE these medications    acetaminophen 500 MG tablet Commonly known as: TYLENOL Take 1,000 mg by mouth every 8 (eight) hours as needed for mild pain or moderate pain (for pain.).   colesevelam 625 MG tablet Commonly known as: WELCHOL Take 1,250 mg by mouth 2 (two) times daily with a meal.   lidocaine 5 % Commonly known as: LIDODERM Place 2 patches onto the skin daily. Remove & Discard patch within 12 hours or as directed by MD Start taking on: September 08, 2023   Rivaroxaban 15 MG Tabs tablet Commonly known as: XARELTO Take 1 tablet (15 mg total) by mouth daily with supper. What changed:  medication strength how much to take   rosuvastatin 40 MG tablet Commonly known as: CRESTOR Take 1 tablet (40 mg total) by mouth at bedtime.   SYSTANE ULTRA OP Place 1 drop into both eyes daily. Unknown strength   VITAMIN B-12 IJ Inject 1 each as directed every 30 (thirty) days.   Vitamin D3 50 MCG (2000 UT) Tabs Take 4,000 Units by mouth every other day.        DISPOSITION AND FOLLOW-UP:  [name] was discharged from [facility] in Stable condition. At the hospital follow up visit please address:  Presyncope/history of a flutter status post  pacemaker: Per cardiology, patient is told to stop taking amiodarone.  We also stopped his Lasix during this hospitalization due to his hypotensive episodes.  Please start the Lasix as patient's blood pressure tolerates.  We also stopped his Exelon patch that could also be contributing to his syncopal episodes. Rib fractures: I discharged him with some Lidoderm patches and he can take Tylenol 650 mg as needed for pain control. Hx of A flutter: Xarelto is reduced to 15 mg once daily based on patient's creatinine clearance.  Please make sure the patient has a follow-up with his cardiology/EP/neurology.  Follow-up Recommendations: Consults: None Labs: Basic Metabolic Profile Studies: None Medications: Stop taking amiodarone, Lasix, Exelon patch.  Otherwise he can continue taking WelChol, Xarelto 15 mg daily, Crestor.  Follow-up Appointments:  Follow-up Information     Gordan Payment., MD. Call.   Specialty: Internal Medicine Why: [id number] @ 11:20 AM Contact information: [address] [place] [address] [phone]         Louis Lemming, MD. Call.   Specialty: Cardiology Why: Hosiptal follow up Jan. 15, 2025 @10 :30 AM Contact information: [address] [place] [address] [place] [address] [phone]                 HOSPITAL COURSE:  Patient Summary: Louis Barton is a 82 y.o. 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    #Near Syncope #Hx of A flutter s/p pacemaker #Hx BPPV Presented with two days of dizziness, lightheadedness, near syncope, falls. No LOC, bowel or bladder incontinence, abnormal  limb movements, tongue biting. He took losartan 50 mg on Thursday, Friday, Saturday after noting elevated blood pressure readings beginning of last week. He also took an x-ray dose of Lasix on Friday after noting he had ankle swelling. Also endorsing lightheadedness and dizziness with position changes, decreased p.o. intake for the past couple of days. He has history of symptomatic bradycardia with pacemaker, follows cardiology. Labs reassuring with no electrolyte abnormalities, some elevation in Cr 1.25 [0.96], normal WBC and Hgb. Normal UA. Etiology multifactorial, his symptoms likely precipitated with restarting his losartan 50 mg, taking it on Thurs/Fri/Sat, extra dose of Lasix on Fri, decreased p.o. intake resulting in near-syncope and a component of dysautonomia. He did have his pacemaker evaluated in the ED that showed intermittent underlying aflutter/afib. Cardiology consulted.  Appreciate cardiology recommendation at this time, they recommended stopping the amiodarone.  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  On 12/11, concerns for orthostatic hypotension, fluctuating blood pressures as patient change position, RN did message that while he was sitting on his bedside commode for bowel movement, he did have an episode of hypotension.  There is likely a vasovagal component that is also playing a role in his near syncope.  We gave him a 500 cc lactated ringer bolus yesterday.  It responded well.  Otherwise patient will benefit physical and Occupational Therapy.  Per evaluation today, patient reports he is overall doing well.  Daughter at bedside.  Patient denies any other episodes of dizziness or lightheadedness today.  He denies any nausea or vomiting abdominal pain.  He report he had a bowel movement.  No other concerns at this time.  Patient is advised to stop his amiodarone and Lasix until he is seen by his neurologist and cardiologist.  Patient is advised to not take  any more of his old blood pressure medications.   #Fall #Rib Fractures, L Ribs 10-11th Had a fall 12/8 at church after he stumbled. Imaging showed acute fractures of the anterior aspect of the left ninth and 10th rib.  Patient did endorse left lower leg pain, he was given Lidoderm patches and we scheduled Tylenol 1000 mg every 8 hours for his moderate pain.  He did report that when he took his Tylenol it brought his pain down from a 8-4.    Chronic conditions HLD: Continue home medication Crestor 40 mg and Colesevelam 1,250 mg BID  HFpEF: Does not appear volume overload at this time, has 1+ LLE pitting edema that is at baseline.  HTN: Losartan stopped last year per patient due to episodic dizziness. BP fluctuating with softer blood pressures 12/11, patient is given 500 cc LR bolus.  We also stopped his amiodarone per cardiology, Dr. Elberta Fortis will f/u OP and decide whether he needs to restart his amiodarone or not.  Otherwise patient is recommended good p.o. intake and physical therapy.    DISCHARGE INSTRUCTIONS:   Discharge Instructions     Diet - low sodium heart healthy   Complete by: As directed    Discharge instructions  Complete by: As directed    [name], Louis Barton came to the hospital for episodes of near syncope, dizziness, lightheadedness.  You were treated with fluids and we stopped your blood pressure and amiodarone medication.  We did have you work with physical and Occupational Therapy.  Cardiology came in and saw you as well.  They do not think it is your heart that is causing these episodes.  We think you will highly benefit from outpatient physical and Occupational Therapy.  For your near syncope/dizziness/lightheadedness -Please do not take any of your old blood pressure medications such as losartan. -Please hold your Lasix for now, until you are seen by your primary care doctor. -Please hold amiodarone for now, until you are seen by your EP doctor.  -Please hold the Exelon  patch for now until you follow up with your Neurology doctor -xarelto dose was decreased to 15 mg with your kidney function  For your ribs fracture -You can continue taking Tylenol 650 mg every 6-8 hours as needed for pain control -I am also sending you with Lidoderm patches, you can place a patch onto your skin for 12 hours and then remove it after 12 hours.  Otherwise he can continue taking your Crestor 40 mg and Colesevelam 1,250 mg BID.  If you have any of these following symptoms, please call us or seek care at an emergency department: -Chest Pain -Difficulty Breathing -Worsening abdominal pain -Syncope (passing out) -Drooping of face -Slurred speech -Sudden weakness in your leg or arm -Fever -Chills -blood in the stool -dark black, sticky stool  If you have any questions or concerns, call our clinic at [phone] or after hours call [phone] and ask for the internal medicine resident on call.  I am glad you are feeling better. It was a pleasure taking care for you. I wish a good recovery and good health!   Dr. Jeral Pinch   Increase activity slowly   Complete by: As directed        SUBJECTIVE:  Patient is evaluated bedside this morning, with family present.  Patient denies any dizziness or lightheadedness this morning.  He denies any chest pain, shortness of breath, abdominal pain, nausea, vomiting.  He reports he had a bowel movement yesterday.  No other questions at this time.  Otherwise he stable to be discharged to the skilled nursing facility today.  Discharge Vitals:   BP 126/88 (BP Location: Left Arm)   Pulse 60   Temp 98 F (36.7 C) (Oral)   Resp 18   Ht 5\' 8"  (1.727 m)   Wt 71.2 kg   SpO2 99%   BMI 23.87 kg/m   OBJECTIVE:  Physical Exam   Constitutional: Laying in bed, no acute distress HENT: normocephalic atraumatic, mucous membranes moist Cardiovascular: regular rate and rhythm, no m/r/g, 1+ pitting ankle edema LLE Pulmonary/Chest:  normal work of breathing on room air, lungs clear to auscultation bilaterally Abdominal: soft, non-tender, non-distended, bowel sounds present Psych: normal mood and affect  Pertinent Labs, Studies, and Procedures:     Latest Ref Rng & Units 09/05/2023    6:49 AM 09/04/2023   11:52 AM 09/03/2023    3:20 PM  CBC  WBC 4.0 - 10.5 K/uL 8.2  9.2  8.7   Hemoglobin 13.0 - 17.0 g/dL 29.5  62.1  30.8   Hematocrit 39.0 - 52.0 % 42.1  45.4  47.7   Platelets 150 - 400 K/uL 118  136  124  Latest Ref Rng & Units 09/07/2023    2:22 AM 09/05/2023    6:49 AM 09/04/2023   11:52 AM  CMP  Glucose 70 - 99 mg/dL 440  97  102   BUN 8 - 23 mg/dL 14  12  14    Creatinine 0.61 - 1.24 mg/dL 7.25  3.66  4.40   Sodium 135 - 145 mmol/L 137  139  137   Potassium 3.5 - 5.1 mmol/L 4.0  4.2  4.1   Chloride 98 - 111 mmol/L 107  106  103   CO2 22 - 32 mmol/L 23  26  24    Calcium 8.9 - 10.3 mg/dL 8.2  8.3  8.7   Total Protein 6.5 - 8.1 g/dL   6.0   Total Bilirubin <1.2 mg/dL   1.2   Alkaline Phos 38 - 126 U/L   48   AST 15 - 41 U/L   26   ALT 0 - 44 U/L   23     No results found.   Signed: Jeral Pinch, D.O.  Internal Medicine Resident, PGY-1 [facility]  Pager: [phone] 1:03 PM, 09/07/2023

## 2023-09-07 NOTE — Progress Notes (Signed)
Report called to [facility] in [place] at [phone].

## 2023-09-11 ENCOUNTER — Other Ambulatory Visit: Payer: Self-pay | Admitting: Neurology

## 2023-09-11 DIAGNOSIS — G3184 Mild cognitive impairment, so stated: Secondary | ICD-10-CM

## 2023-09-11 DIAGNOSIS — R269 Unspecified abnormalities of gait and mobility: Secondary | ICD-10-CM

## 2023-09-21 DIAGNOSIS — R29898 Other symptoms and signs involving the musculoskeletal system: Secondary | ICD-10-CM

## 2023-09-21 HISTORY — DX: Other symptoms and signs involving the musculoskeletal system: R29.898

## 2023-09-25 ENCOUNTER — Other Ambulatory Visit: Payer: Medicare Other

## 2023-09-29 ENCOUNTER — Encounter: Payer: Self-pay | Admitting: Cardiology

## 2023-09-29 DIAGNOSIS — C801 Malignant (primary) neoplasm, unspecified: Secondary | ICD-10-CM | POA: Insufficient documentation

## 2023-09-29 DIAGNOSIS — I509 Heart failure, unspecified: Secondary | ICD-10-CM | POA: Insufficient documentation

## 2023-10-02 ENCOUNTER — Telehealth: Payer: Self-pay | Admitting: Oncology

## 2023-10-02 NOTE — Telephone Encounter (Signed)
 CT Chest has been scheduled for 10/20/23; Check in @ 10:15 am   Notified pt of date,time and instructions.

## 2023-10-03 ENCOUNTER — Encounter: Payer: Self-pay | Admitting: Cardiology

## 2023-10-03 ENCOUNTER — Ambulatory Visit: Payer: Medicare Other | Attending: Cardiology | Admitting: Cardiology

## 2023-10-03 VITALS — BP 124/78 | HR 62 | Ht 68.0 in | Wt 162.2 lb

## 2023-10-03 DIAGNOSIS — C801 Malignant (primary) neoplasm, unspecified: Secondary | ICD-10-CM | POA: Diagnosis not present

## 2023-10-03 DIAGNOSIS — I5032 Chronic diastolic (congestive) heart failure: Secondary | ICD-10-CM | POA: Diagnosis not present

## 2023-10-03 DIAGNOSIS — C3490 Malignant neoplasm of unspecified part of unspecified bronchus or lung: Secondary | ICD-10-CM | POA: Insufficient documentation

## 2023-10-03 DIAGNOSIS — R55 Syncope and collapse: Secondary | ICD-10-CM | POA: Insufficient documentation

## 2023-10-03 DIAGNOSIS — I2510 Atherosclerotic heart disease of native coronary artery without angina pectoris: Secondary | ICD-10-CM | POA: Diagnosis not present

## 2023-10-03 DIAGNOSIS — I459 Conduction disorder, unspecified: Secondary | ICD-10-CM | POA: Insufficient documentation

## 2023-10-03 NOTE — Progress Notes (Signed)
 Cardiology Office Note:    Date:  10/03/2023   ID:  Louis Barton, DOB 23-Feb-1941, MRN [id number]  PCP:  Thurmond Cathlyn LABOR., MD  Cardiologist:  Lamar Fitch, MD    Referring MD: Thurmond Cathlyn LABOR., MD   No chief complaint on file.   History of Present Illness:    Louis Barton is a 83 y.o. male    with quite complex past medical history.  That include coronary artery disease, mild nonobstructive, cardiac catheterization done in May 2022 showed 20% mid and distal circumflex as well as 20% of mid LAD lesion.  Also history of essential hypertension first-degree AV block, status post bilateral carotic endarterectomy, non-small cell lung cancer status post wedge resection and right lower lobe, remote history of TIA.  Recently he ended up being in [facility] because of episode of atrial flutter with slow ventricular rate.  He did have TEE done and then eventually electrical cardioversion.  Few months ago he came to hospital with confusion and suspicion for CVA/TIA has been raised however future evaluation showed found that he was positive for COVID-19.  Luckily recovered quite nicely.  While he was in the hospital he was bradycardic with 2-1 AV conduction.  He was eventually referred to EP team for consideration of pacemaker implantation.  Eventually end of November of last year he did have pacemaker implantation.  There was a problem with the lead to the activity will need to be revised after that he was noted to have typical atrial flutter and in March of this year he did have cardioversion and seems to maintain sinus rhythm since then.  He comes today to follow-up.  Tragically strike his wife who had severe arctic stenosis had TAVI done however shortly after that she got vascular collapse and she had perforated left ventricle surgically intervene however eventually she ended up passing.   Comes today to my office.  At the beginning of December he ended being in a hospital because of dizziness and unsteady  gait also some confusion.  Quite extensive workup has been done from cardiac standpoint review losartan  has been withdrawn because low blood pressure, he is amiodarone  being withdrawn and is also some additional medication has been withdrawn since that time he seems to be doing better.  Still stumble a little bit.  He has been followed by neurology myelogram is planned.  He did have CT of his spine done but we do not have results of it.  Cardiac wise denies have any chest pain tightness squeezing pressure burning chest  Past Medical History:  Diagnosis Date   Abnormal stress test    Acute encephalopathy 05/05/2021   Aftercare following surgery of the circulatory system, NEC 12/19/2013   Arteriosclerosis of coronary artery 11/16/2015   Formatting of this note might be different from the original.  Formatting of this note might be different from the original.  Formatting of this note might be different from the original.  On imaging;  Formatting of this note might be different from the original.  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  02/2021  Formatting of this note might be different from the original.  Formatting of this note might be different from the original.  02/2021     Chronic kidney disease    cysts on both kidneys, seeing DOROTHA Sharps in [place], [facility]    Coronary arteriosclerosis 11/16/2015   Formatting of this note might be different from  the original. On imaging;   COVID 05/05/2021   COVID-19 virus infection 05/05/2021   Cramps of left lower extremity-Calf  > right calf 01/01/2015   Cyst, meninges, spinal 11/16/2015   Degeneration of lumbar intervertebral disc 11/16/2015   Formatting of this note might be different from the original. signficant DDD present with vacuum effect L4-5 and L5-S1 flat discs and anterior spurring noted   Dizziness 05/06/2020   Dyslipidemia 03/18/2015   Dyspnea on exertion 04/02/2020   Ear itch 05/14/2018   Edema 11/16/2015   Edema of both lower extremities due to peripheral venous insufficiency 02/03/2020   Elevated tumor markers 12/15/2017   Erectile dysfunction 11/16/2015   First degree AV block 10/20/2020   Gait abnormality 04/19/2022   Heart block 05/05/2021   Heart failure (HCC)    History of hiatal hernia    seen on last scan- as slight    History of thrombocytopenia    History of TIAs    Hyperlipidemia    Hypertension    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 [place]. And Dr. Darlean Deal of this note might be different from the original.  Formatting of this note might be different from the original.  Stage 1A. [place]. And Dr. Darlean Deal of this note might be different from the original.  Stage 1A. [place]. And Dr. Darlean Adenocarcinoma (non-small cell). Observation.     Malignant neoplasm of left lung (HCC) 12/08/2017   Stage 1A. [place]. And Dr. 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  Formatting of this note might be different from the original.  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 Renal parapelvic cysts 10/06/16 on CT , right kidney midpole 1.2 cm, right kidney anterior pole 1.1 cm, and to the left kidney lower pole anterior position 3.5cm all felt to be benign   Right bundle branch block 10/20/2020   Right lower lobe pulmonary nodule 11/09/2017   Secondary hypercoagulable state (HCC) 08/11/2021   Shingles    internal- obstruction of bowels & gallbladder  Sinus bradycardia 05/05/2021   Solitary pulmonary nodule 11/16/2016   CT ABd 03/24/09 linerar densities in RLL and RML c/w scarring o/w clear CT Abd 10/20/16 c/w 7 mm nodule  - CT chest 02/13/2017 :  slt enlarged to 9 mm   - PET 04/04/17 :1. These slowly enlarging 9 mm right lower lobe pulmonary nodule along the right hemidiaphragm does not appear hypermetabolic today. Location adjacent to the hemidiaphragm can cause false negatives due to motion artifact related to the   Sprain of left ankle 05/21/2021   Stage 3a chronic kidney disease (HCC) 06/17/2020   Stroke (HCC)    Swelling of limb-Left foot 01/01/2015   Symptomatic bradycardia 03/18/2015   Thrombocytopenia (HCC) 11/16/2015   Typical atrial flutter (HCC) 03/19/2021   Vitamin B12 deficiency 06/21/2017   Weakness of left lower extremity 09/21/2023    Past Surgical History:  Procedure Laterality Date   adenocarcinoma  2019   Removed   CARDIOVERSION N/A 03/22/2021   Procedure: CARDIOVERSION;  Surgeon: Pietro Redell RAMAN, MD;  Location: [facility] ENDOSCOPY;  Service: Cardiovascular;  Laterality: N/A;    CARDIOVERSION N/A 12/15/2021   Procedure: CARDIOVERSION;  Surgeon: Alvan Ronal BRAVO, MD;  Location: [facility] ENDOSCOPY;  Service: Cardiovascular;  Laterality: N/A;   CAROTID ENDARTERECTOMY  2005   Left carotid by Dr. Harvey   CAROTID ENDARTERECTOMY  04/25/11   Right Carotid by Dr. Harvey   COLONOSCOPY  02/23/2017   Colonic polyp status post polypectomy. Pancolonic diverticulosis predominatly in the sigmoid colon. Tubular adenoma   LEAD REVISION/REPAIR N/A 08/16/2021   Procedure: LEAD REVISION/REPAIR;  Surgeon: Inocencio Soyla Lunger, MD;  Location: [facility] INVASIVE CV LAB;  Service: Cardiovascular;  Laterality: N/A;   LEFT HEART CATH AND CORONARY ANGIOGRAPHY N/A 02/08/2021   Procedure: LEFT HEART CATH AND CORONARY ANGIOGRAPHY;  Surgeon: Verlin Lonni BIRCH, MD;  Location: [facility] INVASIVE CV LAB;  Service: Cardiovascular;  Laterality: N/A;   PACEMAKER IMPLANT N/A 07/28/2021   Procedure: PACEMAKER IMPLANT;  Surgeon: Inocencio Soyla Lunger, MD;  Location: [facility] INVASIVE CV LAB;  Service: Cardiovascular;  Laterality: N/A;   Septoplasty, nasal w/ submucosal resection  1960   SQUAMOUS CELL CARCINOMA EXCISION     pt said numerous times   TEE WITHOUT CARDIOVERSION N/A 03/22/2021   Procedure: TRANSESOPHAGEAL ECHOCARDIOGRAM (TEE);  Surgeon: Pietro Redell RAMAN, MD;  Location: [facility] ENDOSCOPY;  Service: Cardiovascular;  Laterality: N/A;   TONSILLECTOMY     VIDEO ASSISTED THORACOSCOPY (VATS)/WEDGE RESECTION Right 11/09/2017   Procedure: RIGHT VIDEO ASSISTED THORACOSCOPY WITH MERVIN RESECTION;  Surgeon: Kerrin Elspeth BROCKS, MD;  Location: [facility] OR;  Service: Thoracic;  Laterality: Right;    Current Medications: Current Meds  Medication Sig   acetaminophen  (TYLENOL ) 500 MG tablet Take 1,000 mg by mouth every 8 (eight) hours as needed for mild pain or moderate pain (for pain.).   Cholecalciferol (VITAMIN D3) 50 MCG (2000 UT) TABS Take 4,000 Units by mouth every other day.   colesevelam  (WELCHOL ) 625 MG tablet Take 1,250 mg by  mouth 2 (two) times daily with a meal.   Cyanocobalamin (VITAMIN B-12 IJ) Inject 1 each as directed every 30 (thirty) days.   lidocaine  (LIDODERM ) 5 % Place 2 patches onto the skin daily. Remove & Discard patch within 12 hours or as directed by MD   Polyethyl Glycol-Propyl Glycol (SYSTANE ULTRA OP) Place 1 drop into both eyes daily. Unknown strength   Rivaroxaban  (XARELTO ) 15 MG TABS tablet Take 1 tablet (15 mg total) by mouth daily with supper.   rosuvastatin  (CRESTOR )  40 MG tablet Take 1 tablet (40 mg total) by mouth at bedtime.     Allergies:   Patient has no known allergies.   Social History   Socioeconomic History   Marital status: Widowed    Spouse name: Not on file   Number of children: 2   Years of education: Not on file   Highest education level: Not on file  Occupational History   Not on file  Tobacco Use   Smoking status: Former    Current packs/day: 0.00    Average packs/day: 1 pack/day for 16.0 years (16.0 ttl pk-yrs)    Types: Cigarettes    Start date: 09/26/1956    Quit date: 09/26/1972    Years since quitting: 51.0   Smokeless tobacco: Never   Tobacco comments:    Former smoker 08/11/21  Vaping Use   Vaping status: Never Used  Substance and Sexual Activity   Alcohol  use: Not Currently    Alcohol /week: 7.0 standard drinks of alcohol     Types: 7 Shots of liquor per week    Comment: 1 shot daily 08/11/21   Drug use: No   Sexual activity: Not Currently  Other Topics Concern   Not on file  Social History Narrative   Not on file   Social Drivers of Health   Financial Resource Strain: Not on file  Food Insecurity: No Food Insecurity (09/05/2023)   Hunger Vital Sign    Worried About Running Out of Food in the Last Year: Never true    Ran Out of Food in the Last Year: Never true  Transportation Needs: No Transportation Needs (09/05/2023)   PRAPARE - Administrator, Civil Service (Medical): No    Lack of Transportation (Non-Medical): No  Physical  Activity: Not on file  Stress: Not on file  Social Connections: Not on file     Family History: The patient's family history includes Cancer in his brother and sister; Cancer (age of onset: 47) in his mother; Deep vein thrombosis in his mother; Heart attack in his father and mother; Heart disease in his mother; Hyperlipidemia in his mother; Hypertension in his mother; Other in his mother and sister; Stroke in his mother; Varicose Veins in his mother. There is no history of Colon cancer, Esophageal cancer, Rectal cancer, or Stomach cancer. ROS:   Please see the history of present illness.    All 14 point review of systems negative except as described per history of present illness  EKGs/Labs/Other Studies Reviewed:         Recent Labs: 12/19/2022: TSH 2.530 03/15/2023: NT-Pro BNP 817 09/04/2023: ALT 23; Magnesium 1.9 09/05/2023: Hemoglobin 13.5; Platelets 118 09/07/2023: BUN 14; Creatinine, Ser 1.18; Potassium 4.0; Sodium 137  Recent Lipid Panel    Component Value Date/Time   CHOL 159 11/21/2018 0836   TRIG 82 11/21/2018 0836   HDL 59 11/21/2018 0836   CHOLHDL 2.7 11/21/2018 0836   LDLCALC 84 11/21/2018 0836    Physical Exam:    VS:  BP 124/78   Pulse 62   Ht 5' 8 (1.727 m)   Wt 162 lb 3.2 oz (73.6 kg)   BMI 24.66 kg/m     Wt Readings from Last 3 Encounters:  10/03/23 162 lb 3.2 oz (73.6 kg)  09/05/23 156 lb 15.5 oz (71.2 kg)  09/03/23 158 lb 15.2 oz (72.1 kg)     GEN:  Well nourished, well developed in no acute distress HEENT: Normal NECK: No JVD; No carotid bruits  LYMPHATICS: No lymphadenopathy CARDIAC: RRR, no murmurs, no rubs, no gallops RESPIRATORY:  Clear to auscultation without rales, wheezing or rhonchi  ABDOMEN: Soft, non-tender, non-distended MUSCULOSKELETAL: Bilateral 1+ edema worse on the left like always; No deformity  SKIN: Warm and dry LOWER EXTREMITIES: no swelling NEUROLOGIC:  Alert and oriented x 3 PSYCHIATRIC:  Normal affect   ASSESSMENT:     1. Cancer (HCC)   2. Coronary artery disease involving native coronary artery of native heart without angina pectoris   3. Chronic heart failure with preserved ejection fraction (HCC)   4. Near syncope   5. Heart block   6. 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)    PLAN:    In order of problems listed above:  Lung cancer.  Stable followed by oncology. Coronary disease stable on guideline directed medical therapy will complete continue. Diastolic congestive heart failure.  Still swelling of lower extremities however I am afraid to give him any water.  Because of episode of hypotension and dizziness and near syncope.  I asked him to watch swelling if it is worse he need to let me know. Heart block addressed with the pacemaker. Dizziness I do not think is cardiac follow-up by neurology   Medication Adjustments/Labs and Tests Ordered: Current medicines are reviewed at length with the patient today.  Concerns regarding medicines are outlined above.  No orders of the defined types were placed in this encounter.  Medication changes: No orders of the defined types were placed in this encounter.   Signed, Lamar DOROTHA Fitch, MD, [facility] 10/03/2023 9:44 AM    [facility]

## 2023-10-03 NOTE — Patient Instructions (Signed)

## 2023-10-10 NOTE — Progress Notes (Signed)
 Electrophysiology Office Note:   Date:  10/11/2023  ID:  Louis BURNINGHAM, DOB October 02, 1940, MRN [id number]  Primary Cardiologist: Louis Burger, MD Primary Heart Failure: None Electrophysiologist: Louis Cortland Ding, MD       History of Present Illness:   Louis Barton is a 83 y.o. male with h/o symptomatic bradycardia s/p PPM, AF, HFpEF, bilateral CEA (L 2005, R 2012), HLD, BPPV, CVA, ascending aortic aneurysm, PAD, Lung CA,CKD IIIa,  B12 deficiency, osteoarthritis seen today for post hospital follow up.    Admit 12/9-12/12/24 in setting of syncopal episodes. He was admitted with 2 days of dizziness, lightheadedness, near syncope, falls but no LOC.  He was found to have rib fractures on the left (10-11th ribs). He had taken extra doses of lasix  prior to admit for LE edema. PPM interrogation in ER showed intermittent AFL/AF.  Cardiology was consulted with recommendation to stop amiodarone . He was felt to have a vasovagal component. He was advised to stop all BP agents.    Since discharge from hospital the patient reports he continues to have episodes of dizziness / lightheadedness and falls > most recently fell backward. Did not strike his head. He is off all his BP, diuretic and antiarrhythmic meds.   He denies chest pain, palpitations, dyspnea, PND, orthopnea, nausea, vomiting, dizziness, syncope, edema, weight gain, or early satiety.   Review of systems complete and found to be negative unless listed in HPI.   EP Information / Studies Reviewed:    EKG is ordered today. Personal review as below.  EKG Interpretation Date/Time:  Wednesday October 11 2023 10:42:37 EST Ventricular Rate:  61 PR Interval:    QRS Duration:  152 QT Interval:  498 QTC Calculation: 501 R Axis:   79  Text Interpretation: Ventricular-paced rhythm Atrial flutter Confirmed by Louis Barton ([id number]) on 10/11/2023 10:56:00 AM   PPM Interrogation-  reviewed in detail today,  See PACEART report.  Device  History: Medtronic Dual Chamber PPM implanted 07/28/2021 > required full device revision due to loss of capture 08/16/21 for Second Degree AV block  Studies:  LHC 01/2021 > 20% Cx, 20% LAD, mildly elevated LVEDP  ECHO 03/2022 > LVEF 55-60%, LA mild to mod dilated     Arrhythmia / AAD AFL > dx ~ 02/2021 Amiodarone  initiated 10/2021  Risk Assessment/Calculations:    CHA2DS2-VASc Score = 5   This indicates a 7.2% annual risk of stroke. The patient's score is based upon: CHF History: 1 HTN History: 1 Diabetes History: 0 Stroke History: 0 Vascular Disease History: 1 Age Score: 2 Gender Score: 0          Physical Exam:   VS:  BP (!) 130/90   Pulse 61   Ht 5\' 8"  (1.727 m)   Wt 160 lb 12.8 oz (72.9 kg)   SpO2 (!) 88% Comment: Repeat check x 3  BMI 24.45 kg/m    Wt Readings from Last 3 Encounters:  10/11/23 160 lb 12.8 oz (72.9 kg)  10/03/23 162 lb 3.2 oz (73.6 kg)  09/05/23 156 lb 15.5 oz (71.2 kg)     GEN: Well nourished, well developed in no acute distress NECK: No JVD; No carotid bruits CARDIAC: Regular rate and rhythm, no murmurs, rubs, gallops RESPIRATORY:  Clear to auscultation without rales, wheezing or rhonchi  ABDOMEN: Soft, non-tender, non-distended EXTREMITIES:  No edema; No deformity   ASSESSMENT AND PLAN:    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  -99.9% burden on device since beginning of December 2024 -continue OAC for stroke prophylaxis   -reviewed with MD in clinic, accept AF/AFL for now given controlled ventricular rates. Allow amiodarone  to wash out to see if it improves dizziness / falls.  BP stable off meds.  Follow up in one month for review of symptoms.   Secondary Hypercoagulable State  -continue Xarelto  15mg , dose reviewed and appropriate by Cr Cl 48 mL/min based on  09/07/23 labs  Gait Instability / Falls Recent fall with Left Rib Fractures  -discussed with daughter > consider outpatient PT   Stage Ia Adenocarcinoma of the Lung  -follows with Dr. Luna Barton, s/p segmentectomy 10/2017 & Dr. Almer Barton in [place]  Disposition:   Follow up with Dr. Lawana Barton in 4 weeks  Signed, Louis Doffing, MSN, APRN, NP-C, AGACNP-BC [facility] - Electrophysiology  10/11/2023, 1:43 PM

## 2023-10-11 ENCOUNTER — Ambulatory Visit: Payer: Medicare Other | Attending: Pulmonary Disease | Admitting: Pulmonary Disease

## 2023-10-11 ENCOUNTER — Encounter: Payer: Self-pay | Admitting: Pulmonary Disease

## 2023-10-11 VITALS — BP 130/90 | HR 61 | Ht 68.0 in | Wt 160.8 lb

## 2023-10-11 DIAGNOSIS — Z950 Presence of cardiac pacemaker: Secondary | ICD-10-CM | POA: Insufficient documentation

## 2023-10-11 DIAGNOSIS — I480 Paroxysmal atrial fibrillation: Secondary | ICD-10-CM | POA: Insufficient documentation

## 2023-10-11 DIAGNOSIS — I441 Atrioventricular block, second degree: Secondary | ICD-10-CM | POA: Diagnosis not present

## 2023-10-11 LAB — CUP PACEART INCLINIC DEVICE CHECK
Battery Remaining Longevity: 102 mo
Battery Voltage: 3 V
Brady Statistic RA Percent Paced: 0.56 %
Brady Statistic RV Percent Paced: 95.41 %
Date Time Interrogation Session: 20250115133627
Implantable Lead Connection Status: 753985
Implantable Lead Connection Status: 753985
Implantable Lead Implant Date: 20221102
Implantable Lead Implant Date: 20221121
Implantable Lead Location: 753859
Implantable Lead Location: 753860
Implantable Lead Model: 3830
Implantable Lead Model: 5076
Implantable Pulse Generator Implant Date: 20221102
Lead Channel Impedance Value: 285 Ohm
Lead Channel Impedance Value: 342 Ohm
Lead Channel Impedance Value: 380 Ohm
Lead Channel Impedance Value: 475 Ohm
Lead Channel Pacing Threshold Amplitude: 0.625 V
Lead Channel Pacing Threshold Amplitude: 0.875 V
Lead Channel Pacing Threshold Pulse Width: 0.4 ms
Lead Channel Pacing Threshold Pulse Width: 0.4 ms
Lead Channel Sensing Intrinsic Amplitude: 0.625 mV
Lead Channel Sensing Intrinsic Amplitude: 1 mV
Lead Channel Sensing Intrinsic Amplitude: 10.625 mV
Lead Channel Sensing Intrinsic Amplitude: 10.625 mV
Lead Channel Setting Pacing Amplitude: 2 V
Lead Channel Setting Pacing Amplitude: 2.25 V
Lead Channel Setting Pacing Pulse Width: 0.4 ms
Lead Channel Setting Sensing Sensitivity: 0.9 mV
Zone Setting Status: 755011
Zone Setting Status: 755011

## 2023-10-11 NOTE — Patient Instructions (Signed)
 Medication Instructions:  Your physician recommends that you continue on your current medications as directed. Please refer to the Current Medication list given to you today.  *If you need a refill on your cardiac medications before your next appointment, please call your pharmacy*  Lab Work: None ordered If you have labs (blood work) drawn today and your tests are completely normal, you will receive your results only by: MyChart Message (if you have MyChart) OR A paper copy in the mail If you have any lab test that is abnormal or we need to change your treatment, we will call you to review the results.  Follow-Up: At [facility], you and your health needs are our priority.  As part of our continuing mission to provide you with exceptional heart care, we have created designated Provider Care Teams.  These Care Teams include your primary Cardiologist (physician) and Advanced Practice Providers (APPs -  Physician Assistants and Nurse Practitioners) who all work together to provide you with the care you need, when you need it.  Your next appointment:   1 month(s)  Provider:   Agatha Horsfall, MD only

## 2023-10-17 ENCOUNTER — Ambulatory Visit: Payer: Medicare Other | Admitting: Podiatry

## 2023-10-20 ENCOUNTER — Telehealth: Payer: Self-pay | Admitting: Oncology

## 2023-10-20 LAB — BASIC METABOLIC PANEL: EGFR: 74

## 2023-10-20 NOTE — Telephone Encounter (Signed)
Notification received from [facility] stating that pt did not complete his study.   Per note: "Patient has an appointment with Dr. Gilman Buttner on 1/27. He is having other problems and wants to discuss with her before scan chest. He thinkks she may want CT A/P too."

## 2023-10-26 ENCOUNTER — Ambulatory Visit (INDEPENDENT_AMBULATORY_CARE_PROVIDER_SITE_OTHER): Payer: Medicare Other

## 2023-10-26 DIAGNOSIS — I441 Atrioventricular block, second degree: Secondary | ICD-10-CM

## 2023-10-26 LAB — CUP PACEART REMOTE DEVICE CHECK
Battery Remaining Longevity: 102 mo
Battery Voltage: 3 V
Brady Statistic RA Percent Paced: 1.12 %
Brady Statistic RV Percent Paced: 98.7 %
Date Time Interrogation Session: 20250129223040
Implantable Lead Connection Status: 753985
Implantable Lead Connection Status: 753985
Implantable Lead Implant Date: 20221102
Implantable Lead Implant Date: 20221121
Implantable Lead Location: 753859
Implantable Lead Location: 753860
Implantable Lead Model: 3830
Implantable Lead Model: 5076
Implantable Pulse Generator Implant Date: 20221102
Lead Channel Impedance Value: 266 Ohm
Lead Channel Impedance Value: 323 Ohm
Lead Channel Impedance Value: 380 Ohm
Lead Channel Impedance Value: 456 Ohm
Lead Channel Pacing Threshold Amplitude: 0.625 V
Lead Channel Pacing Threshold Amplitude: 0.875 V
Lead Channel Pacing Threshold Pulse Width: 0.4 ms
Lead Channel Pacing Threshold Pulse Width: 0.4 ms
Lead Channel Sensing Intrinsic Amplitude: 0.5 mV
Lead Channel Sensing Intrinsic Amplitude: 0.5 mV
Lead Channel Sensing Intrinsic Amplitude: 10.625 mV
Lead Channel Sensing Intrinsic Amplitude: 10.625 mV
Lead Channel Setting Pacing Amplitude: 2 V
Lead Channel Setting Pacing Amplitude: 2.25 V
Lead Channel Setting Pacing Pulse Width: 0.4 ms
Lead Channel Setting Sensing Sensitivity: 0.9 mV
Zone Setting Status: 755011
Zone Setting Status: 755011

## 2023-10-26 NOTE — Progress Notes (Signed)
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  We began seeing him in March 2019 for follow-up.  He was found to have an enlarging right lower lobe nodule in January 2019 and had a wedge resection by VATS procedure by Dr. Charlett Lango in February.  Pathology revealed an invasive moderately differentiated adenocarcinoma measuring 1 cm with 14 - nodes.  His CEA was elevated at 9 postoperatively.He is on observation only.  He is also being followed by the urologist for multiple renal cysts.  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  Colonoscopy in May 2018 revealed colon polyps with pathology revealing tubular adenomas.  Upper endoscopy and colonoscopy in 2019 did not reveal any malignancy, so we feel we have ruled out a gastrointestinal source as the cause of the increased CEA.  The CEA increased to 10.5 in November, but then was back down to 8.9 in February.  Repeat CT imaging of the chest, abdomen and pelvis did not reveal any evidence of malignancy.  Previously seen right pleural effusion had decreased, there was a similar appearance of the suspected rounded atelectasis at the right lung base, which was also decreased.  CEA at that time was 9.9.  There was stable bilateral renal cysts.  He had surgery of the left lateral and posterior neck for additional squamous cell carcinomas of the skin.  Repeat CT chest, abdomen and pelvis in October revealed stable rounded presumed atelectasis in the right lower lobe along the resection site with stable scarring, stable volume loss in the right lower lobe and right middle  lobe, as well as stable small adjacent right pleural effusion with adjacent pleural thickening without changes suggestive of active malignancy.  The bilateral renal cysts were stable.   CT imaging from May 2021 revealed stable appearance of right pleural thickening, trace pleural effusion and adjacent density along the wedge resection site favoring rounded atelectasis and scarring in the right lower lobe.  No compelling findings of active malignancy.  CEA was 10.5, previously 9.7, but this tends to fluctuate up and down in this range.  He has some residual effects from COVID-19 that he had in November 2020. CEA from May 2022 was up to 11.0 from 10.3 the year before. CT scans of abdomen and pelvis were negative.  CT chest in September 2021 showed scarring only.  We continued to monitor regularly.   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 Patient states that he feels ok but complains of continued imbalance, and trouble with his memory. He continues Xarelto 15 once daily and no longer takes the exelon patch for his memory due to increased dizziness. I recommended that he consider adding back the exelon patch slowly back into his regimen to see if this still effects him and that he meet with his neurologist to discuss other options of treatment. [facility] to [facility] on [facility]/2023 due to recent falls and a severe fall, lethargy, dizziness, and syncope. Multiple medication changes were made and he  is only on 3 medications now, Crestor 40 mg once daily, Xarelto 15 mg at bedtime, and Colesevelam 625 mg BID. He had multiple scans done at this time. 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 Pelvic and femur X-rays were also normal. X-rays of the left ribs  showed acute fractures of the anterior aspects of the left 9th and 10th ribs. CT chest, abdomen, and pelvis revealed acute fractures of the left ninth and tenth ribs but no pneumothorax, small right and trace left pleural effusions with associated atelectasis, and soft tissue attenuation in the left inguinal canal has developed since prior exam. They were also recommended having a myelogram done. He had labs done on 10/20/2023 and had a WBC of 6.2, hemoglobin of 14.9, and platelet count of 132. His CMP is normal and his CEA increased from 11.4 to 12.3. This has been going on for years with fluctuations in his CEA. Patient states that he has increased eating but his daughter says otherwise and inquired about a dietician. I will refer him to our dietician Cindy regarding weight  loss and possible hypoglycemia. He did not get the CT chest that I ordered since it was already done in [facility].  I will see him back in 6 months with CBC, CMP, and CEA a few days before his appointment with me.   He denies signs of infection such as sore throat, sinus drainage, cough, or urinary symptoms.  He denies fevers or recurrent chills. He denies pain. He denies nausea, vomiting, chest pain, dyspnea or cough. His appetite is good and his weight has decreased 2 pounds over last 3.5 weeks . He has lost 10 pounds over the last year. This patient is accompanied in the office by his daughter.    REVIEW OF SYSTEMS:  Review of Systems  Constitutional: Negative.  Negative for appetite change, chills, diaphoresis, fatigue, fever and unexpected weight change.  HENT:  Negative.  Negative for hearing loss, lump/mass, mouth sores, nosebleeds, sore throat, tinnitus, trouble swallowing and voice change.   Eyes: Negative.  Negative for eye problems and icterus.  Respiratory: Negative.  Negative for chest tightness, cough, hemoptysis, shortness of breath and wheezing.   Cardiovascular: Negative.  Negative for chest pain, leg swelling and  palpitations.  Gastrointestinal: Negative.  Negative for abdominal distention, abdominal pain, blood in stool, constipation, diarrhea, nausea, rectal pain and vomiting.  Endocrine: Negative.  Negative for hot flashes.  Genitourinary: Negative.  Negative for bladder incontinence, difficulty urinating, dyspareunia, dysuria, frequency, hematuria, nocturia, pelvic pain and penile discharge.   Musculoskeletal:  Positive for gait problem (imbalance). Negative for arthralgias, back pain, flank pain, myalgias, neck pain and neck stiffness.  Skin: Negative.  Negative for itching, rash and wound.  Neurological:  Positive for dizziness and gait problem (imbalance). Negative for extremity weakness, headaches, light-headedness, numbness, seizures and speech difficulty.  Hematological: Negative.  Negative for adenopathy. Does not bruise/bleed easily.  Psychiatric/Behavioral:  Negative for confusion, decreased concentration, depression, sleep disturbance and suicidal ideas. The patient is nervous/anxious.        Memory issues   VITALS:  Blood pressure (!) 144/92, pulse 83, temperature 97.9 F (36.6 C), temperature source Oral, resp. rate 19, height 5\' 8"  (1.727 m), weight 171 lb 14.4 oz (78 kg), SpO2 98 %.     Wt Readings from Last 3 Encounters:  10/21/22  171 lb 14.4 oz (78 kg)  10/17/22 171 lb 1.6 oz (77.6 kg)  08/23/22 170 lb 9.6 oz (77.4 kg)    Body mass index is 26.14 kg/m.   Performance status (ECOG): 1 - Symptomatic but completely ambulatory   PHYSICAL EXAM:  Physical Exam Vitals and nursing note reviewed. Exam conducted with a chaperone present.  Constitutional:      General: He is not in acute distress.    Appearance: Normal appearance. He is normal weight. He is not ill-appearing, toxic-appearing or diaphoretic.  HENT:     Head: Normocephalic and atraumatic.     Right Ear: Tympanic membrane, ear canal and external ear normal. There is no impacted cerumen.     Left Ear: Tympanic membrane,  ear canal and external ear normal. There is no impacted cerumen.     Nose: Nose normal. No congestion or rhinorrhea.     Mouth/Throat:     Mouth: Mucous membranes are moist.     Pharynx: Oropharynx is clear. No oropharyngeal exudate or posterior oropharyngeal erythema.  Eyes:     General: No scleral icterus.       Right eye: No discharge.        Left eye: No discharge.     Extraocular Movements: Extraocular movements intact.     Conjunctiva/sclera: Conjunctivae normal.     Pupils: Pupils are equal, round, and reactive to light.  Neck:     Vascular: No carotid bruit.  Cardiovascular:     Rate and Rhythm: Normal rate and regular rhythm.     Pulses: Normal pulses.     Heart sounds: Normal heart sounds. No murmur heard.    No friction rub. No gallop.  Pulmonary:     Effort: Pulmonary effort is normal. No respiratory distress.     Breath sounds: No stridor. Examination of the right-lower field reveals decreased breath sounds. Decreased breath sounds present. No wheezing, rhonchi or rales.  Chest:     Chest wall: No tenderness.  Abdominal:     General: Bowel sounds are normal. There is no distension.     Palpations: Abdomen is soft. There is no hepatomegaly, splenomegaly or mass.     Tenderness: There is no abdominal tenderness. There is no right CVA tenderness, left CVA tenderness, guarding or rebound.     Hernia: No hernia is present.  Musculoskeletal:        General: No swelling, tenderness, deformity or signs of injury. Normal range of motion.     Cervical back: Normal range of motion and neck supple. No rigidity or tenderness.     Right lower leg: 1+ Edema present.     Left lower leg: 1+ Edema present.  Lymphadenopathy:     Cervical: No cervical adenopathy.     Upper Body:     Right upper body: No supraclavicular or axillary adenopathy.     Left upper body: No supraclavicular or axillary adenopathy.     Lower Body: No right inguinal adenopathy. No left inguinal adenopathy.   Skin:    General: Skin is warm and dry.     Coloration: Skin is not jaundiced or pale.     Findings: No bruising, erythema, lesion or rash.  Neurological:     General: No focal deficit present.     Mental Status: He is alert and oriented to person, place, and time. Mental status is at baseline.     Cranial Nerves: No cranial nerve deficit.     Sensory: No sensory deficit.  Motor: No weakness.     Coordination: Coordination normal.     Gait: Gait normal.     Deep Tendon Reflexes: Reflexes normal.  Psychiatric:        Mood and Affect: Mood normal.        Behavior: Behavior normal.        Thought Content: Thought content normal.        Judgment: Judgment normal.    LABS:   Lab Results  Component Value Date   WBC 8.2 09/05/2023   HGB 13.5 09/05/2023   HCT 42.1 09/05/2023   MCV 95.9 09/05/2023   PLT 118 (L) 09/05/2023   Lab Results  Component Value Date   CREATININE 1.18 09/07/2023   BUN 14 09/07/2023   NA 137 09/07/2023   K 4.0 09/07/2023   CL 107 09/07/2023   CO2 23 09/07/2023      Component Value Date/Time   PROT 6.0 (L) 09/04/2023 1152   PROT 6.7 11/21/2018 0836   ALBUMIN 3.2 (L) 09/04/2023 1152   ALBUMIN 4.0 11/21/2018 0836   AST 26 09/04/2023 1152   AST 28 04/19/2023 0934   ALT 23 09/04/2023 1152   ALT 28 04/19/2023 0934   ALKPHOS 48 09/04/2023 1152   BILITOT 1.2 (H) 09/04/2023 1152   BILITOT 0.9 04/19/2023 0934   BILIDIR 0.15 11/21/2018 0836     Component Ref Range & Units 2 d ago (04/19/23) 6 mo ago (10/17/22) 9 mo ago (07/04/22)  CEA 0.0 - 4.7 ng/mL 11.4 High  12.2 High  CM 10.4 High  CM   Component Ref Range & Units 3 wk ago  T4, Free 0.6 - 1.3 ng/dL 1.1   Component Ref Range & Units 3 wk ago  TSH 0.450 - 5.330 uIU/mL 2.683     STUDIES:         HISTORY:   No Known Allergies     Current Medications:                                       colesevelam (WELCHOL) 625 MG tablet Take 1,250 mg by mouth 2 (two) times daily with  a meal.                                     rosuvastatin (CRESTOR) 40 MG tablet Take 1 tablet (40 mg total) by mouth at bedtime.                No current facility-administered medications for this visit.      ASSESSMENT & PLAN:  Assessment:    1. 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  He remains without evidence of disease for nearly 5 years. His CT from January, 2024 reveals stable post surgical changes of the right lung with adjacent pleural thickening and nodular opacity. He has no evidence of metastatic disease in the chest, abdomen, or pelvis. CT chest, abdomen, and pelvis in December of 2024 revealed acute fractures of the left ninth and tenth ribs but no pneumothorax, small right and trace left pleural effusions with associated atelectasis.   2.  Elevation of the CEA of uncertain etiology, which tends to fluctuate up and down, but is persistent and was up to 12.2 in January and is now down to 11.4 in July  but back up to 12.3 in January, 2025.    3. Multiple renal cysts being monitored by Dr. Katrinka Blazing, his urologist with [facility]. These appear benign.    4. Multiple skin cancers. He had a squamous cell carcinoma resected from the upper right anterior chest.    5. Multiple colon polyps.  His last colonoscopy was in October 2019 with Dr. Chales Abrahams, which revealed two polyps consistent with tubular adenomas.  I therefore would consider at least 1 more colonoscopy even though he is in his 56's, as he is a fairly healthy individual, especially with the persistently elevated CEA.     6.  Minimal thrombocytopenia.  7. Possible hypoglycemia and weight loss. I will ask our dietician to meet with him to discuss prevention of hypoglycemia and measures for weight gain.   8. Ataxic gait and instability when walking. I don't know whether this is partially neuropathy or cerebellar degeneration. The Amiodarone has been stopped as it may be  aggravating this situation.   9. Memory impairment and early dementia. The neurologist has tried him on an Exelon patch but he feels this caused too much dizziness.   Plan: He continues Xarelto 15 once daily and no longer takes the Exelon patch for his memory due to increased dizziness. I recommended that he consider adding back the exelon patch slowly back into his regimen to see if this still effects him and that he meet with his neurologist to discuss other options of treatment. [facility] to [facility] on [facility]/2023 due to recent falls and a severe fall, lethargy, dizziness, and syncope. Multiple medication changes were made and he is only on 3 medications now, Crestor 40 mg once daily, Xarelto 15 mg at bedtime, and Colesevelam 625 mg BID. He had multiple scans done at this time. 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 Pelvic and femur X-rays were also normal. X-rays of the left ribs showed acute fractures of the anterior aspects of the left 9th and 10th ribs. CT chest, abdomen, and pelvis revealed acute fractures of the left ninth and tenth ribs but no pneumothorax, small right and trace left pleural effusions with associated atelectasis, and soft tissue attenuation in the left inguinal canal has developed since prior exam. They were also recommended having a myelogram done. He had labs done on 10/20/2023 and had a WBC of 6.2, hemoglobin of 14.9, and platelet count of 132. His CMP is normal and his CEA increased from 11.4 to 12.3. This has been going on for years with fluctuations in his CEA. Patient states that he has increased eating but his daughter says otherwise and inquired about a dietician. I will refer him to our dietician Cindy regarding weight loss and possible hypoglycemia. I will see him back in 6 months with  CBC, CMP, and CEA a few days before his appointment with me. The patient understands the plans discussed today and is in agreement with them.  The patient knows to contact our office if his develops concerns prior to his next appointment.   I provided 30 minutes of face-to-face time during this this encounter and > 50% was spent counseling as documented under my assessment and plan.   Dellia Beckwith, MD  [facility] - A DEPT OF [facility] [address] [place]  [address] Dept: [phone] Dept Fax: [phone]   Orders Placed This Encounter  Procedures   Ambulatory Referral to [facility] Nutrition    Referral Priority:   Routine    Referral Type:   Consultation    Referral Reason:   Specialty Services Required    Number of Visits Requested:   1    I,Jasmine M Lassiter,acting as a scribe for Dellia Beckwith, MD.,have documented all relevant documentation on the behalf of Dellia Beckwith, MD,as directed by  Dellia Beckwith, MD while in the presence of Dellia Beckwith, MD.

## 2023-10-26 NOTE — Telephone Encounter (Signed)
Unable to reach via phone, voicemail was full.

## 2023-10-27 ENCOUNTER — Inpatient Hospital Stay: Payer: Medicare Other | Attending: Oncology | Admitting: Oncology

## 2023-10-27 ENCOUNTER — Encounter: Payer: Self-pay | Admitting: Oncology

## 2023-10-27 ENCOUNTER — Inpatient Hospital Stay: Payer: Medicare Other

## 2023-10-27 ENCOUNTER — Other Ambulatory Visit: Payer: Self-pay | Admitting: Oncology

## 2023-10-27 VITALS — BP 150/95 | HR 80 | Temp 97.9°F | Resp 18 | Ht 68.0 in | Wt 161.5 lb

## 2023-10-27 DIAGNOSIS — D696 Thrombocytopenia, unspecified: Secondary | ICD-10-CM | POA: Diagnosis not present

## 2023-10-27 DIAGNOSIS — R978 Other abnormal tumor markers: Secondary | ICD-10-CM

## 2023-10-27 DIAGNOSIS — C3490 Malignant neoplasm of unspecified part of unspecified bronchus or lung: Secondary | ICD-10-CM

## 2023-10-27 DIAGNOSIS — Z85118 Personal history of other malignant neoplasm of bronchus and lung: Secondary | ICD-10-CM | POA: Insufficient documentation

## 2023-10-27 DIAGNOSIS — Z7901 Long term (current) use of anticoagulants: Secondary | ICD-10-CM | POA: Insufficient documentation

## 2023-10-31 ENCOUNTER — Telehealth: Payer: Self-pay | Admitting: Dietician

## 2023-10-31 ENCOUNTER — Inpatient Hospital Stay: Payer: Medicare Other | Attending: Oncology | Admitting: Dietician

## 2023-10-31 NOTE — Progress Notes (Signed)
 Nutrition Assessment: Met with patient in person in my office at the cancer center.   Reason for Assessment: MD referral for weight loss   ASSESSMENT: Patent is 83 year old male with Stage IA (T1a N0 M0) non-small cell lung cancer under observation by Dr. Cornelius  He has a PMHx that includes  IBS, heart disease, stroke, HTN, Hypogonadism, CKD, Vit B12 deficiency, Pre DM, and Covid.  Fluid retention on LE left side is worse.  Was on Lasix  but stopped taking. His wife died in 2021-09-16 and he has a lady friend who he eats many of his meals with but doesn't live with.  He eats most meals out at [employer] or with his lady friend who cooks.  He usually eats 3 meals per day.  He did have some trouble remembering what he had eaten and also some memory issues with dates of weight loss. We reviewed usual intake with food models for portions.  Breakfast: Raisin bran and granola with 2% milk (drinks) No lunch yet today usual meals: Soup and sandwich, or leftovers Dinner:  (eats out with his lady friend) pork loin (3-4oz) green beans  with corn last night, also reports many meals that are just vegetables   Eats fruit occasionally, not usually a snacker will eat some dessert loves apples pie Fluids: Coffee (black no sugar) Milk (with pie or dessert) water 4 large glasses (~48oz), juice only once in a while, was using a Boost ONS but he feels like they are fattening   Medications: Crestor  40 mg once daily, Xarelto  15 mg at bedtime, and Colesevelam  625 mg BID    Labs: 09/07/23 Ca+ 8.2   Anthropometrics: Weight loss 10# last year from 12/19/22-04/03/23 with small fluctuations since then.  Height: 68 Weight:  10/31/23  159.9# 10/27/23  161.5# UBW: 158-162# (past several months) BMI: 34.5   Estimated Energy Needs  Kcals: 1800-2200 Protein: 73-88 g Fluid: 2 L   NUTRITION DIAGNOSIS: Food and Nutrition Related Knowledge Deficit related to cancer and associated treatments as evidenced by no prior need for  nutrition related information.    INTERVENTION:   Relayed that nutrition services are wrap around service provided at no charge and encouraged continued communication if experiencing any nutritional impact symptoms (NIS). Educated on importance of adequate nourishment with calorie and protein energy intake with nutrient dense foods when possible to maintain weight/strength and QOL.   Discussed ways to add protein to meals provided tip sheet for vegetarian sources as well. Encouraged high protein snacks like nuts and seeds. Suggested oral nutrition supplement 20-30g protein less than 200 calories gave samples of Orgain high protein, HP CIB, recipe for high protein milk take one QD between meals. Provided recipes booklet and contact information provided.  MONITORING, EVALUATION, GOAL: weight trends, nutrition impact symptoms, PO intake, labs  Goal is 2# weight gain per month to goal of filing out his size 36 jeans better.  Next Visit: Patient will decide in a month if he needs a follow up.  Micheline Craven, RDN, LDN Registered Dietitian, [facility] Part Time Remote (Usual office hours: Tuesday-Thursday) Mobile: [phone]

## 2023-10-31 NOTE — Telephone Encounter (Signed)
 Attempted to reach patient for a scheduled nutrition consult. He was scheduled in  person but has not yet shown up.  I left a message at his home# and provided my cell# in a text to his mobile#  to return call for his nutrition consult.  Micheline Craven, RDN, LDN Registered Dietitian, [facility] Part Time Remote (Usual office hours: Tuesday-Thursday) Cell: [phone]

## 2023-11-02 ENCOUNTER — Ambulatory Visit: Payer: Medicare Other | Attending: Cardiology

## 2023-11-02 DIAGNOSIS — I10 Essential (primary) hypertension: Secondary | ICD-10-CM | POA: Diagnosis present

## 2023-11-02 NOTE — Progress Notes (Signed)
   Nurse Visit   Date of Encounter: 11/02/2023 ID: Louis [facility], DOB 02/13/1941, MRN [id number]  PCP:  Thurmond Cathlyn LABOR., MD   [facility] Providers Cardiologist:  Lamar Fitch, MD Electrophysiologist:  Soyla Gladis Norton, MD      Visit Details   VS:  There were no vitals taken for this visit. , BMI There is no height or weight on file to calculate BMI.  Wt Readings from Last 3 Encounters:  10/27/23 161 lb 8 oz (73.3 kg)  10/11/23 160 lb 12.8 oz (72.9 kg)  10/03/23 162 lb 3.2 oz (73.6 kg)     Reason for visit: BP check  Performed today: Vitals & checked BP with pts machine Changes (medications, testing, etc.) : No changes- sent to Dr. Krasowski for review Length of Visit: 15 minutes    Medications Adjustments/Labs and Tests Ordered: No orders of the defined types were placed in this encounter.  No orders of the defined types were placed in this encounter.    Signed, Olam JONETTA Lesches, RN  11/02/2023 12:45 PM

## 2023-11-03 ENCOUNTER — Telehealth: Payer: Self-pay | Admitting: Neurology

## 2023-11-03 NOTE — Telephone Encounter (Signed)
 Pt/'s daughter on DPR wanted to discuss the  DG MYELOGRAPHY LUMBAR INJ MULTI REGION  That was ordered for the pt and also the pts Dementia with the RN or MD. Please advise.

## 2023-11-06 ENCOUNTER — Telehealth: Payer: Self-pay | Admitting: Neurology

## 2023-11-06 ENCOUNTER — Encounter (INDEPENDENT_AMBULATORY_CARE_PROVIDER_SITE_OTHER): Payer: Self-pay | Admitting: Neurology

## 2023-11-06 DIAGNOSIS — R269 Unspecified abnormalities of gait and mobility: Secondary | ICD-10-CM

## 2023-11-06 DIAGNOSIS — G3184 Mild cognitive impairment, so stated: Secondary | ICD-10-CM | POA: Diagnosis not present

## 2023-11-06 MED ORDER — MEMANTINE HCL 10 MG PO TABS
10.0000 mg | ORAL_TABLET | Freq: Two times a day (BID) | ORAL | 11 refills | Status: DC
Start: 2023-11-06 — End: 2023-12-07

## 2023-11-06 NOTE — Telephone Encounter (Signed)
 Documented in another encounter. 3 month follow up with Dr. Gracie Lav

## 2023-11-06 NOTE — Telephone Encounter (Signed)
 Phone call document separately for Nov 06 2023.  Please see the MyChart message reply(ies) for my assessment and plan.    This patient gave consent for this Medical Advice Message and is aware that it may result in a bill to [employer], as well as the possibility of receiving a bill for a co-payment or deductible. They are an established patient, but are not seeking medical advice exclusively about a problem treated during an in person or video visit in the last seven days. I did not recommend an in person or video visit within seven days of my reply.    I spent a total of 10 minutes cumulative time within 7 days through [employer].  Phebe Brasil, MD

## 2023-11-06 NOTE — Telephone Encounter (Signed)
 I talked with his daughter Macky Sayres, following last office visit in November, he was admitted to the hospital in December 2024 for presyncope, history of atrial flutter, pacemaker, adjustment of his medication including stop Lasix , due to hypotensive episode, also stop Exelon patch, worried it might contributed to his dizziness/syncope,  Xarelto  was reduced to 15 mg due to decreased creatinine clearance, he also fall, suffered multiple closed rib fracture on the left side  Had a CT head September 03, 2023, no acute intracranial abnormality, small vessel disease  CT cervical spine showed multilevel degenerative changes, most noticeable at lower cervical region, worry about canal stenosis  I ordered CT myelogram following last visit with his complaints of gait abnormality, not carried through  Daughter also concerned about his cognitive impairment, worsening delusional idea after stopping Exelon patch  Plan is as follows  1.,  Start Namenda  10 mg twice a day  2, hold off CT myelogram,  3, follow-up visit in 3 months,

## 2023-11-09 ENCOUNTER — Ambulatory Visit: Payer: Medicare Other | Attending: Cardiology | Admitting: Cardiology

## 2023-11-09 ENCOUNTER — Encounter: Payer: Self-pay | Admitting: *Deleted

## 2023-11-09 ENCOUNTER — Encounter: Payer: Self-pay | Admitting: Cardiology

## 2023-11-09 VITALS — BP 146/76 | HR 78 | Ht 68.0 in | Wt 161.0 lb

## 2023-11-09 DIAGNOSIS — D6869 Other thrombophilia: Secondary | ICD-10-CM | POA: Diagnosis present

## 2023-11-09 DIAGNOSIS — I483 Typical atrial flutter: Secondary | ICD-10-CM | POA: Diagnosis not present

## 2023-11-09 DIAGNOSIS — I441 Atrioventricular block, second degree: Secondary | ICD-10-CM

## 2023-11-09 DIAGNOSIS — I5032 Chronic diastolic (congestive) heart failure: Secondary | ICD-10-CM

## 2023-11-09 DIAGNOSIS — I4819 Other persistent atrial fibrillation: Secondary | ICD-10-CM

## 2023-11-09 NOTE — Patient Instructions (Addendum)
Medication Instructions:  Your physician recommends that you continue on your current medications as directed. Please refer to the Current Medication list given to you today.  *If you need a refill on your cardiac medications before your next appointment, please call your pharmacy*   Lab Work: None ordered   Testing/Procedures: Your physician has recommended that you have a Cardioversion (DCCV). Electrical Cardioversion uses a jolt of electricity to your heart either through paddles or wired patches attached to your chest. This is a controlled, usually prescheduled, procedure. Defibrillation is done under light anesthesia in the hospital, and you usually go home the day of the procedure. This is done to get your heart back into a normal rhythm. You are not awake for the procedure. Please see the instruction sheet given to you today.   Follow-Up: At [facility], you and your health needs are our priority.  As part of our continuing mission to provide you with exceptional heart care, we have created designated Provider Care Teams.  These Care Teams include your primary Cardiologist (physician) and Advanced Practice Providers (APPs -  Physician Assistants and Nurse Practitioners) who all work together to provide you with the care you need, when you need it.  Remote monitoring is used to monitor your Pacemaker or ICD from home. This monitoring reduces the number of office visits required to check your device to one time per year. It allows [place] to keep an eye on the functioning of your device to ensure it is working properly. You are scheduled for a device check from home on 01/25/2024. You may send your transmission at any time that day. If you have a wireless device, the transmission will be sent automatically. After your physician reviews your transmission, you will receive a postcard with your next transmission date.  Your next appointment:   6 week(s)  The format for your next appointment:    In Person  Provider:   You will follow up in the Atrial Fibrillation Clinic located at [facility]. Your provider will be: Clint R. Fenton, PA-C or Lake Bells, PA-C {   Thank you for choosing [facility]!!   Dory Horn, RN [phone]

## 2023-11-09 NOTE — H&P (View-Only) (Signed)
  Electrophysiology Office Note:   Date:  11/09/2023  ID:  Louis Barton, DOB Nov 30, 1940, MRN [id number]  Primary Cardiologist: Gypsy Balsam, MD Primary Heart Failure: None Electrophysiologist: Wanell Lorenzi Jorja Loa, MD      History of Present Illness:   Louis Barton is a 83 y.o. 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   Since last being seen in our clinic the patient reports continued dizziness and unsteadiness.  He presented to the hospital with similar symptoms and his blood pressure medications were stopped.  He does have lower extremity edema, but this is being watched due to his dizziness.  He was found to be somewhat hypotensive at the hospital he did have a fall with 2 rib fractures he had taken an extra dose of Lasix prior to his admission due to lower extremity edema..  he denies chest pain, palpitations, dyspnea, PND, orthopnea, nausea, vomiting, dizziness, syncope, edema, weight gain, or early satiety.   Review of systems complete and found to be negative unless listed in HPI.      EP Information / Studies Reviewed:    EKG is not ordered today. EKG from 10/11/23 reviewed which showed atrial flutter, V paced      PPM Interrogation-  reviewed in detail today,  See PACEART report.  Device History: Medtronic Dual Chamber PPM implanted 07/28/2021 for Second Degree AV block  Risk Assessment/Calculations:    CHA2DS2-VASc Score = 5   This indicates a 7.2% annual risk of stroke. The patient's score is based upon: CHF History: 1 HTN History: 1 Diabetes History: 0 Stroke History: 0 Vascular Disease History: 1 Age Score: 2 Gender Score: 0            Physical Exam:   VS:  BP (!) 146/76 (BP Location: Left Arm, Patient Position: Sitting, Cuff Size: Normal)   Pulse 78   Ht 5\' 8"  (1.727 m)   Wt  161 lb (73 kg)   SpO2 97%   BMI 24.48 kg/m    Wt Readings from Last 3 Encounters:  11/09/23 161 lb (73 kg)  11/02/23 161 lb (73 kg)  10/27/23 161 lb 8 oz (73.3 kg)     GEN: Well nourished, well developed in no acute distress NECK: No JVD; No carotid bruits CARDIAC: Regular rate and rhythm, no murmurs, rubs, gallops RESPIRATORY:  Clear to auscultation without rales, wheezing or rhonchi  ABDOMEN: Soft, non-tender, non-distended EXTREMITIES:  No edema; No deformity   ASSESSMENT AND PLAN:    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 See Pace Art report No changes today  2.  Atrial fibrillation/typical atrial flutter: He is currently in atrial fibrillation today.  He has some dizziness and unsteadiness.  This could potentially be related to atrial fibrillation.  To check this, we Louis Barton plan for cardioversion.  He potentially missed a dose of Xarelto.  Louis Barton plan for cardioversion in 2 weeks.  3.  Chronic diastolic heart failure: No obvious volume overload  4.  Secondary hypercoagulable state: Currently on Xarelto 15 mg daily for atrial flutter  Disposition:   Follow up with Afib Clinic in 4 weeks  Signed, Shaine Mount Jorja Loa, MD

## 2023-11-09 NOTE — Progress Notes (Signed)
  Electrophysiology Office Note:   Date:  11/09/2023  ID:  Louis Barton, DOB Nov 30, 1940, MRN [id number]  Primary Cardiologist: Gypsy Balsam, MD Primary Heart Failure: None Electrophysiologist: Wanell Lorenzi Jorja Loa, MD      History of Present Illness:   Louis Barton is a 83 y.o. 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   Since last being seen in our clinic the patient reports continued dizziness and unsteadiness.  He presented to the hospital with similar symptoms and his blood pressure medications were stopped.  He does have lower extremity edema, but this is being watched due to his dizziness.  He was found to be somewhat hypotensive at the hospital he did have a fall with 2 rib fractures he had taken an extra dose of Lasix prior to his admission due to lower extremity edema..  he denies chest pain, palpitations, dyspnea, PND, orthopnea, nausea, vomiting, dizziness, syncope, edema, weight gain, or early satiety.   Review of systems complete and found to be negative unless listed in HPI.      EP Information / Studies Reviewed:    EKG is not ordered today. EKG from 10/11/23 reviewed which showed atrial flutter, V paced      PPM Interrogation-  reviewed in detail today,  See PACEART report.  Device History: Medtronic Dual Chamber PPM implanted 07/28/2021 for Second Degree AV block  Risk Assessment/Calculations:    CHA2DS2-VASc Score = 5   This indicates a 7.2% annual risk of stroke. The patient's score is based upon: CHF History: 1 HTN History: 1 Diabetes History: 0 Stroke History: 0 Vascular Disease History: 1 Age Score: 2 Gender Score: 0            Physical Exam:   VS:  BP (!) 146/76 (BP Location: Left Arm, Patient Position: Sitting, Cuff Size: Normal)   Pulse 78   Ht 5\' 8"  (1.727 m)   Wt  161 lb (73 kg)   SpO2 97%   BMI 24.48 kg/m    Wt Readings from Last 3 Encounters:  11/09/23 161 lb (73 kg)  11/02/23 161 lb (73 kg)  10/27/23 161 lb 8 oz (73.3 kg)     GEN: Well nourished, well developed in no acute distress NECK: No JVD; No carotid bruits CARDIAC: Regular rate and rhythm, no murmurs, rubs, gallops RESPIRATORY:  Clear to auscultation without rales, wheezing or rhonchi  ABDOMEN: Soft, non-tender, non-distended EXTREMITIES:  No edema; No deformity   ASSESSMENT AND PLAN:    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 See Pace Art report No changes today  2.  Atrial fibrillation/typical atrial flutter: He is currently in atrial fibrillation today.  He has some dizziness and unsteadiness.  This could potentially be related to atrial fibrillation.  To check this, we Sekai Nayak plan for cardioversion.  He potentially missed a dose of Xarelto.  Bricyn Labrada plan for cardioversion in 2 weeks.  3.  Chronic diastolic heart failure: No obvious volume overload  4.  Secondary hypercoagulable state: Currently on Xarelto 15 mg daily for atrial flutter  Disposition:   Follow up with Afib Clinic in 4 weeks  Signed, Shaine Mount Jorja Loa, MD

## 2023-11-22 NOTE — OR Nursing (Signed)
 Called patient with pre-procedure instructions for tomorrow.   Patient informed of:   Time to arrive for procedure. 0700 Remain NPO past midnight.  Must have a ride home and a responsible adult to remain with them for 24 hours post procedure.  Confirmed blood thinner. Confirmed no breaks in taking blood thinner for 3+ weeks prior to procedure. Confirmed patient stopped all GLP-1s and GLP-2s for at least one week before procedure.

## 2023-11-23 ENCOUNTER — Other Ambulatory Visit: Payer: Self-pay

## 2023-11-23 ENCOUNTER — Encounter (HOSPITAL_COMMUNITY)
Admission: RE | Disposition: A | Discharge status: Home / Self Care | Payer: Self-pay | Source: Home / Self Care | Attending: Cardiology

## 2023-11-23 ENCOUNTER — Ambulatory Visit (HOSPITAL_COMMUNITY): Payer: Medicare Other | Admitting: Anesthesiology

## 2023-11-23 ENCOUNTER — Ambulatory Visit (HOSPITAL_COMMUNITY)
Admission: RE | Admit: 2023-11-23 | Discharge: 2023-11-23 | Disposition: A | Discharge status: Home / Self Care | Payer: Medicare Other | Attending: Cardiology | Admitting: Cardiology

## 2023-11-23 ENCOUNTER — Encounter (HOSPITAL_COMMUNITY): Payer: Self-pay | Admitting: Cardiology

## 2023-11-23 DIAGNOSIS — I2510 Atherosclerotic heart disease of native coronary artery without angina pectoris: Secondary | ICD-10-CM | POA: Insufficient documentation

## 2023-11-23 DIAGNOSIS — I4892 Unspecified atrial flutter: Secondary | ICD-10-CM

## 2023-11-23 DIAGNOSIS — I5032 Chronic diastolic (congestive) heart failure: Secondary | ICD-10-CM | POA: Insufficient documentation

## 2023-11-23 DIAGNOSIS — I110 Hypertensive heart disease with heart failure: Secondary | ICD-10-CM | POA: Insufficient documentation

## 2023-11-23 DIAGNOSIS — Z7901 Long term (current) use of anticoagulants: Secondary | ICD-10-CM | POA: Insufficient documentation

## 2023-11-23 DIAGNOSIS — I441 Atrioventricular block, second degree: Secondary | ICD-10-CM | POA: Insufficient documentation

## 2023-11-23 DIAGNOSIS — I480 Paroxysmal atrial fibrillation: Secondary | ICD-10-CM

## 2023-11-23 DIAGNOSIS — I4891 Unspecified atrial fibrillation: Secondary | ICD-10-CM

## 2023-11-23 DIAGNOSIS — I10 Essential (primary) hypertension: Secondary | ICD-10-CM | POA: Diagnosis not present

## 2023-11-23 DIAGNOSIS — Z85118 Personal history of other malignant neoplasm of bronchus and lung: Secondary | ICD-10-CM | POA: Insufficient documentation

## 2023-11-23 DIAGNOSIS — Z87891 Personal history of nicotine dependence: Secondary | ICD-10-CM | POA: Diagnosis not present

## 2023-11-23 DIAGNOSIS — D6869 Other thrombophilia: Secondary | ICD-10-CM | POA: Insufficient documentation

## 2023-11-23 DIAGNOSIS — I483 Typical atrial flutter: Secondary | ICD-10-CM | POA: Insufficient documentation

## 2023-11-23 DIAGNOSIS — Z950 Presence of cardiac pacemaker: Secondary | ICD-10-CM | POA: Insufficient documentation

## 2023-11-23 DIAGNOSIS — Z8673 Personal history of transient ischemic attack (TIA), and cerebral infarction without residual deficits: Secondary | ICD-10-CM | POA: Insufficient documentation

## 2023-11-23 DIAGNOSIS — I4819 Other persistent atrial fibrillation: Secondary | ICD-10-CM

## 2023-11-23 HISTORY — PX: CARDIOVERSION: EP1203

## 2023-11-23 LAB — POCT I-STAT, CHEM 8
BUN: 20 mg/dL (ref 8–23)
Calcium, Ion: 1.22 mmol/L (ref 1.15–1.40)
Chloride: 107 mmol/L (ref 98–111)
Creatinine, Ser: 1.2 mg/dL (ref 0.61–1.24)
Glucose, Bld: 81 mg/dL (ref 70–99)
HCT: 45 % (ref 39.0–52.0)
Hemoglobin: 15.3 g/dL (ref 13.0–17.0)
Potassium: 4.1 mmol/L (ref 3.5–5.1)
Sodium: 143 mmol/L (ref 135–145)
TCO2: 25 mmol/L (ref 22–32)

## 2023-11-23 SURGERY — CARDIOVERSION (CATH LAB)
Anesthesia: General

## 2023-11-23 MED ORDER — LIDOCAINE 2% (20 MG/ML) 5 ML SYRINGE
INTRAMUSCULAR | Status: DC | PRN
  Administered 2023-11-23: 80 mg via INTRAVENOUS

## 2023-11-23 MED ORDER — SODIUM CHLORIDE 0.9 % IV SOLN: INTRAVENOUS | Status: DC

## 2023-11-23 MED ORDER — PROPOFOL 10 MG/ML IV BOLUS
INTRAVENOUS | Status: DC | PRN
  Administered 2023-11-23: 75 mg via INTRAVENOUS

## 2023-11-23 SURGICAL SUPPLY — 1 items: PAD DEFIB RADIO PHYSIO CONN (PAD) ×1 IMPLANT

## 2023-11-23 NOTE — Transfer of Care (Signed)
 Immediate Anesthesia Transfer of Care Note  Patient: Louis Barton  Procedure(s) Performed: CARDIOVERSION  Patient Location: Cath Lab  Anesthesia Type:General  Level of Consciousness: drowsy and patient cooperative  Airway & Oxygen Therapy: Patient Spontanous Breathing and Patient connected to nasal cannula oxygen  Post-op Assessment: Report given to RN and Post -op Vital signs reviewed and stable  Post vital signs: Reviewed and stable  Last Vitals:  Vitals Value Taken Time  BP 117/69   Temp    Pulse 62 11/23/23 0801  Resp 26 11/23/23 0802  SpO2 99 % 11/23/23 0801    Last Pain:  Vitals:   11/23/23 0742  TempSrc:   PainSc: 0-No pain         Complications: No notable events documented.

## 2023-11-23 NOTE — Anesthesia Preprocedure Evaluation (Addendum)
 Anesthesia Evaluation  Patient identified by MRN, date of birth, ID band Patient awake    Reviewed: Allergy & Precautions, Patient's Chart, lab work & pertinent test results  Airway Mallampati: II  TM Distance: >3 FB Neck ROM: Full    Dental no notable dental hx.    Pulmonary former smoker   Pulmonary exam normal        Cardiovascular hypertension, + CAD and + Peripheral Vascular Disease  + dysrhythmias Atrial Fibrillation + pacemaker  Rhythm:Irregular Rate:Normal  Echo:   1. Left ventricular ejection fraction, by estimation, is 55 to 60%. The  left ventricle has normal function. The left ventricle has no regional  wall motion abnormalities. Left ventricular diastolic parameters are  indeterminate.   2. Right ventricular systolic function is normal. The right ventricular  size is normal.   3. Left atrial size was mild to moderately dilated.   4. The mitral valve is normal in structure. Moderate mitral valve  regurgitation. No evidence of mitral stenosis.   5. The aortic valve is normal in structure. Aortic valve regurgitation is  trivial. No aortic stenosis is present.   6. The inferior vena cava is normal in size with greater than 50%  respiratory variability, suggesting right atrial pressure of 3 mmHg.     Neuro/Psych CVA  negative psych ROS   GI/Hepatic Neg liver ROS, hiatal hernia,,,  Endo/Other  negative endocrine ROS    Renal/GU Renal disease     Musculoskeletal  (+) Arthritis ,    Abdominal Normal abdominal exam  (+)   Peds  Hematology negative hematology ROS (+)   Anesthesia Other Findings   Reproductive/Obstetrics                             Anesthesia Physical Anesthesia Plan  ASA: 3  Anesthesia Plan: General   Post-op Pain Management:    Induction: Intravenous  PONV Risk Score and Plan: 1 and Propofol infusion  Airway Management Planned: Natural Airway and  Simple Face Mask  Additional Equipment: None  Intra-op Plan:   Post-operative Plan:   Informed Consent: I have reviewed the patients History and Physical, chart, labs and discussed the procedure including the risks, benefits and alternatives for the proposed anesthesia with the patient or authorized representative who has indicated his/her understanding and acceptance.     Dental advisory given  Plan Discussed with: CRNA  Anesthesia Plan Comments:        Anesthesia Quick Evaluation

## 2023-11-23 NOTE — Interval H&P Note (Signed)
 History and Physical Interval Note:  11/23/2023 7:52 AM  Louis Barton  has presented today for surgery, with the diagnosis of AFIB.  The various methods of treatment have been discussed with the patient and family. After consideration of risks, benefits and other options for treatment, the patient has consented to  Procedure(s): CARDIOVERSION (N/A) as a surgical intervention.  The patient's history has been reviewed, patient examined, no change in status, stable for surgery.  I have reviewed the patient's chart and labs.  Questions were answered to the patient's satisfaction.     [employer]

## 2023-11-23 NOTE — CV Procedure (Signed)
    Electrical Cardioversion Procedure Note Louis Barton [id number] 1941-04-01  Procedure: Electrical Cardioversion Indications:  Atrial Flutter  Time Out: Verified patient identification, verified procedure,medications/allergies/relevent history reviewed, required imaging and test results available.  Performed  Procedure Details  The patient was NPO after midnight. Anesthesia was administered at the beside  by anesthesia with propofol.  Cardioversion was performed with synchronized biphasic defibrillation via AP pads with 70 joules.  1 attempt(s) were performed.  The patient converted to normal sinus rhythm. The patient tolerated the procedure well   IMPRESSION:  Successful cardioversion of atrial flutter. AP/VP on Medtronic device post confirming conversion.     Donato Schultz 11/23/2023, 8:01 AM

## 2023-11-24 NOTE — Anesthesia Postprocedure Evaluation (Signed)
 Anesthesia Post Note  Patient: Louis Barton  Procedure(s) Performed: CARDIOVERSION     Patient location during evaluation: PACU Anesthesia Type: General Level of consciousness: awake and alert Pain management: pain level controlled Vital Signs Assessment: post-procedure vital signs reviewed and stable Respiratory status: spontaneous breathing, nonlabored ventilation, respiratory function stable and patient connected to nasal cannula oxygen Cardiovascular status: blood pressure returned to baseline and stable Postop Assessment: no apparent nausea or vomiting Anesthetic complications: no   No notable events documented.  Last Vitals:  Vitals:   11/23/23 0820 11/23/23 0830  BP: 129/80 130/82  Pulse: 61 60  Resp: 11 13  Temp:  36.7 C  SpO2: 95% 95%    Last Pain:  Vitals:   11/23/23 0830  TempSrc: Temporal  PainSc: 0-No pain                 Earl Lites P Cache Bills

## 2023-11-27 ENCOUNTER — Ambulatory Visit

## 2023-11-27 NOTE — Progress Notes (Signed)
   Nurse Visit   Date of Encounter: 11/27/2023 ID: Louis Barton, DOB [birth year], MRN [id number]  PCP:  Gordan Payment., MD   [facility] Providers Cardiologist:  Gypsy Balsam, MD Electrophysiologist:  Regan Lemming, MD      Visit Details   VS:  BP 130/80 (BP Location: Left Arm, Patient Position: Sitting, Cuff Size: Normal)   Pulse 60  , BMI There is no height or weight on file to calculate BMI.  Wt Readings from Last 3 Encounters:  11/09/23 161 lb (73 kg)  11/02/23 161 lb (73 kg)  10/27/23 161 lb 8 oz (73.3 kg)     Reason for visit: Perform blood pressure on patient Performed today: Vitals, Provider consulted and Education Changes (medications, testing, etc.) : Patient was diaphoretic and light headed and dizzy sitting in the room. Spoke to Dr. Vincent Gros and he recommended that the patient go to the ER to be evaluated.Patient stated that he would go to the ER. Length of Visit: 25 minutes    Medications Adjustments/Labs and Tests Ordered: No orders of the defined types were placed in this encounter.  No orders of the defined types were placed in this encounter.    Signed, Samson Frederic, RN  11/27/2023 2:25 PM

## 2023-12-03 NOTE — Progress Notes (Signed)
 Remote pacemaker transmission.

## 2023-12-04 ENCOUNTER — Telehealth: Payer: Self-pay

## 2023-12-04 NOTE — Telephone Encounter (Signed)
 Alert remote transmission:  AT/AF daily burden >threshold Presenting AFL in progress from 3/6 @ 18:20, controlled rates, Xarelto per EPIC Pt had a successful DCCV 2/27 per EPIC - route to triage Follow up as scheduled. LA, CVRS   Spoke w/ patient at length. He can't say he feels/felt better nor worse. From my conversation with Louis Barton I gathered that he felt better after the DCCV but the dizziness and unsteadiness remained. Has a f/u appointment w/ AF clinic 3/27. No further action necessary at this time.

## 2023-12-07 ENCOUNTER — Encounter (HOSPITAL_BASED_OUTPATIENT_CLINIC_OR_DEPARTMENT_OTHER): Payer: Self-pay | Admitting: Emergency Medicine

## 2023-12-07 ENCOUNTER — Ambulatory Visit (HOSPITAL_BASED_OUTPATIENT_CLINIC_OR_DEPARTMENT_OTHER)
Admission: EM | Admit: 2023-12-07 | Discharge: 2023-12-07 | Disposition: A | Discharge status: Home / Self Care | Attending: Family Medicine | Admitting: Family Medicine

## 2023-12-07 DIAGNOSIS — I4891 Unspecified atrial fibrillation: Secondary | ICD-10-CM

## 2023-12-07 DIAGNOSIS — R55 Syncope and collapse: Secondary | ICD-10-CM

## 2023-12-07 DIAGNOSIS — Z950 Presence of cardiac pacemaker: Secondary | ICD-10-CM

## 2023-12-07 DIAGNOSIS — E860 Dehydration: Secondary | ICD-10-CM | POA: Diagnosis not present

## 2023-12-07 LAB — POCT URINALYSIS DIP (MANUAL ENTRY)
Bilirubin, UA: NEGATIVE
Glucose, UA: NEGATIVE mg/dL
Ketones, POC UA: NEGATIVE mg/dL
Leukocytes, UA: NEGATIVE
Nitrite, UA: NEGATIVE
Protein Ur, POC: 30 mg/dL — AB
Spec Grav, UA: >=1.03 — AB
Urobilinogen, UA: 0.2 U/dL
pH, UA: 5.5

## 2023-12-07 NOTE — Discharge Instructions (Signed)
 Urinalysis shows signs of dehydration.  Needs to get 48 to 64 ounces of fluids daily.  EKG shows atrial fibrillation with a paced ventricular response of 60 bpm.  Follow-up with cardiology.  Follow-up here if symptoms do not improve, worsen or new symptoms occur.

## 2023-12-07 NOTE — ED Provider Notes (Signed)
 [facility]    CSN: [id number] Arrival date & time: 12/07/23  1550      History   Chief Complaint No chief complaint on file.   HPI Louis Barton is a 83 y.o. male.   The patient has an extensive medical history.   He reports his heart goes in and out of Atrial Fibrillation.  He has a pacemaker.  He had a Cardioversion on 11/23/23.  On 11/30/23, he was told that he is back in Atrial Fibrillation.   He was at his storage unit office today and he reports that he passed out. Wife reports he had a shaking episode prior to his arrival today.     Past Medical History:  Diagnosis Date   Abnormal stress test    Acute encephalopathy 05/05/2021   Aftercare following surgery of the circulatory system, NEC 12/19/2013   Arteriosclerosis of coronary artery 11/16/2015   Formatting of this note might be different from the original.  Formatting of this note might be different from the original.  Formatting of this note might be different from the original.  On imaging;  Formatting of this note might be different from the original.  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  02/2021  Formatting of this note might be different from the original.  Formatting of this note might be different from the original.  02/2021     Chronic kidney disease    cysts on both kidneys, seeing Pasty Arch in [place], [facility]    Coronary arteriosclerosis 11/16/2015    Formatting of this note might be different from the original. On imaging;   COVID 05/05/2021   COVID-19 virus infection 05/05/2021   Cramps of left lower extremity-Calf  > right calf 01/01/2015   Cyst, meninges, spinal 11/16/2015   Degeneration of lumbar intervertebral disc 11/16/2015   Formatting of this note might be different from the original. signficant DDD present with vacuum effect L4-5 and L5-S1 flat discs and anterior spurring noted   Dizziness 05/06/2020   Dyslipidemia 03/18/2015   Dyspnea on exertion 04/02/2020   Ear itch 05/14/2018   Edema 11/16/2015   Edema of both lower extremities due to peripheral venous insufficiency 02/03/2020   Elevated tumor markers 12/15/2017   Erectile dysfunction 11/16/2015   First degree AV block 10/20/2020   Gait abnormality 04/19/2022   Heart block 05/05/2021   Heart failure (HCC)    History of hiatal hernia    seen on last scan- as slight    History of thrombocytopenia    History of TIAs    Hyperlipidemia    Hypertension    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 [place]. And Dr. Truddie Hidden of this note might be different from the original.  Formatting of this note might be different from the original.  Stage 1A. [place]. And Dr. Truddie Hidden of this note might be different from the original.  Stage 1A. [place]. And Dr. Sherene Sires Adenocarcinoma (non-small cell). Observation.     Malignant neoplasm of left lung (HCC) 12/08/2017   Stage 1A. [place]. And Dr. 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  Formatting of this note might be different from the original.  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 These slowly enlarging 9 mm right lower lobe pulmonary nodule along the right hemidiaphragm does not appear hypermetabolic today. Location adjacent to the hemidiaphragm can cause false negatives due to motion artifact related to the   Sprain of left ankle 05/21/2021   Stage 3a chronic kidney disease (HCC) 06/17/2020   Stroke (HCC)    Swelling of limb-Left foot 01/01/2015   Symptomatic bradycardia 03/18/2015   Thrombocytopenia (HCC) 11/16/2015   Typical atrial flutter (HCC) 03/19/2021   Vitamin B12 deficiency 06/21/2017   Weakness of left lower extremity 09/21/2023    Patient Active Problem List   Diagnosis Date Noted   Cancer (HCC)    Heart failure (HCC)    Weakness of left lower extremity 09/21/2023   Chronic anticoagulation 09/05/2023   Multiple closed  fractures of ribs of left side 09/05/2023   Near syncope 09/04/2023   Paroxysmal atrial fibrillation (HCC) 12/23/2022   Gait abnormality 04/19/2022   Pacemaker 10/19/2021   Persistent atrial fibrillation (HCC) 08/11/2021   Secondary hypercoagulable state (HCC) 08/11/2021   Sprain of left ankle 05/21/2021   Heart block 05/05/2021   COVID-19 virus infection 05/05/2021   Acute encephalopathy 05/05/2021   Sinus bradycardia 05/05/2021   COVID 05/05/2021   Stroke (HCC) 05/04/2021   Chronic kidney disease 05/04/2021   Chronic heart failure with preserved ejection fraction (HCC) 03/31/2021   Mild cognitive impairment 03/31/2021   Typical atrial flutter (HCC) 03/19/2021   Hypertension    History of TIAs    History of hiatal hernia    Carotid stenosis  CAD (coronary artery disease)    Abnormal stress test    Malaise and fatigue 12/08/2020   Right bundle branch block 10/20/2020   First degree AV block 10/20/2020   Stage 3a chronic kidney disease (HCC) 06/17/2020   Ascending aortic aneurysm (HCC) 05/15/2020   Dizziness 05/06/2020   Shingles    History of thrombocytopenia    Dyspnea on exertion 04/02/2020   Edema of both lower extremities due to peripheral venous insufficiency 02/03/2020   Prediabetes 12/25/2018   Local edema 02/07/2018   Elevated tumor markers 12/15/2017    Class: Chronic   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) 12/08/2017    Class: History of   Right lower lobe pulmonary nodule 11/09/2017   Vitamin B12 deficiency 06/21/2017   Irritable bowel syndrome 11/17/2016   Solitary pulmonary nodule 11/16/2016   Renal cysts, acquired, bilateral 10/24/2016   Neuritis 10/24/2016   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   Benign paroxysmal positional vertigo 08/08/2016   Hyperlipidemia 11/16/2015   Arteriosclerosis of coronary artery 11/16/2015   Edema 11/16/2015   Coronary arteriosclerosis 11/16/2015   Degeneration of lumbar intervertebral disc 11/16/2015   Paralabral cyst of  hip 11/16/2015   Benign neoplasm of colon 11/16/2015   Thrombocytopenia (HCC) 11/16/2015   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   Erectile dysfunction 11/16/2015   Cyst, meninges, spinal 11/16/2015   Atypical chest pain 06/11/2015   Symptomatic bradycardia 03/18/2015   Dyslipidemia 03/18/2015   PAD (peripheral artery disease) (HCC) 03/18/2015   Cramps of left lower extremity-Calf  > right calf 01/01/2015   Swelling of limb-Left foot 01/01/2015   Preop cardiovascular exam 04/14/2011    Past Surgical History:  Procedure Laterality Date   adenocarcinoma  2019   Removed   CARDIOVERSION N/A 03/22/2021   Procedure: CARDIOVERSION;  Surgeon: Lewayne Bunting, MD;  Location: [facility] ENDOSCOPY;  Service: Cardiovascular;  Laterality: N/A;   CARDIOVERSION N/A 12/15/2021   Procedure: CARDIOVERSION;  Surgeon: Maisie Fus, MD;  Location: [facility] ENDOSCOPY;  Service: Cardiovascular;  Laterality: N/A;   CARDIOVERSION N/A 11/23/2023   Procedure: CARDIOVERSION;  Surgeon: Jake Bathe, MD;  Location: [facility] INVASIVE CV LAB;  Service: Cardiovascular;  Laterality: N/A;   CAROTID ENDARTERECTOMY  2005   Left carotid by Dr. Darrick Penna   CAROTID ENDARTERECTOMY  04/25/11   Right Carotid by Dr. Darrick Penna   COLONOSCOPY  02/23/2017   Colonic polyp status post polypectomy. Pancolonic diverticulosis predominatly in the sigmoid colon. Tubular adenoma   LEAD REVISION/REPAIR N/A 08/16/2021   Procedure: LEAD REVISION/REPAIR;  Surgeon: Regan Lemming, MD;  Location: [facility] INVASIVE CV LAB;  Service: Cardiovascular;  Laterality: N/A;   LEFT HEART CATH AND CORONARY ANGIOGRAPHY N/A 02/08/2021   Procedure: LEFT HEART CATH AND CORONARY ANGIOGRAPHY;  Surgeon: Kathleene Hazel, MD;  Location: [facility] INVASIVE CV LAB;  Service: Cardiovascular;  Laterality: N/A;   PACEMAKER IMPLANT N/A 07/28/2021   Procedure: PACEMAKER IMPLANT;  Surgeon: Regan Lemming, MD;  Location: [facility] INVASIVE CV LAB;  Service: Cardiovascular;  Laterality: N/A;    Septoplasty, nasal w/ submucosal resection  1960   SQUAMOUS CELL CARCINOMA EXCISION     pt said numerous times   TEE WITHOUT CARDIOVERSION N/A 03/22/2021   Procedure: TRANSESOPHAGEAL ECHOCARDIOGRAM (TEE);  Surgeon: Lewayne Bunting, MD;  Location: [facility] ENDOSCOPY;  Service: Cardiovascular;  Laterality: N/A;   TONSILLECTOMY     VIDEO ASSISTED THORACOSCOPY (VATS)/WEDGE RESECTION Right 11/09/2017   Procedure: RIGHT VIDEO ASSISTED THORACOSCOPY WITH Sabino Snipes RESECTION;  Surgeon: Loreli Slot, MD;  Location:  [facility] OR;  Service: Thoracic;  Laterality: Right;       Home Medications    Prior to Admission medications   Medication Sig Start Date End Date Taking? Authorizing Provider  colesevelam (WELCHOL) 625 MG tablet Take 1,250 mg by mouth 2 (two) times daily with a meal.   Yes [provider]  Rivaroxaban (XARELTO) 15 MG TABS tablet Take 1 tablet (15 mg total) by mouth daily with supper. 09/07/23  Yes Masters, Katie, DO  rosuvastatin (CRESTOR) 40 MG tablet Take 1 tablet (40 mg total) by mouth at bedtime. 05/13/21  Yes Zigmund Daniel., MD    Family History Family History  Problem Relation Age of Onset   Cancer Mother 63       Breast   Stroke Mother    Hyperlipidemia Mother    Other Mother        varicose veins   Heart attack Mother    Heart disease Mother        Left ankle swelling   Hypertension Mother    Deep vein thrombosis Mother    Varicose Veins Mother    Heart attack Father    Other Sister        Tumor in Lung and Brain   Cancer Sister        Tumor   Lung  and  Brain   Cancer Brother        BCC-SCC-Merkle Cell   Colon cancer Neg Hx    Esophageal cancer Neg Hx    Rectal cancer Neg Hx    Stomach cancer Neg Hx     Social History Social History   Tobacco Use   Smoking status: Former    Current packs/day: 0.00    Average packs/day: 1 pack/day for 16.0 years (16.0 ttl pk-yrs)    Types: Cigarettes    Start date: 09/26/1956    Quit date: 09/26/1972     Years since quitting: 51.2   Smokeless tobacco: Never   Tobacco comments:    Former smoker 08/11/21  Vaping Use   Vaping status: Never Used  Substance Use Topics   Alcohol use: Not Currently    Alcohol/week: 7.0 standard drinks of alcohol    Types: 7 Shots of liquor per week    Comment: 1 shot daily 08/11/21   Drug use: No     Allergies   Patient has no known allergies.   Review of Systems Review of Systems  Constitutional:  Negative for chills and fever.  HENT:  Negative for ear pain and sore throat.   Eyes:  Negative for pain and visual disturbance.  Respiratory:  Negative for cough.   Cardiovascular:  Positive for palpitations. Negative for chest pain.  Gastrointestinal:  Negative for abdominal pain, constipation, diarrhea, nausea and vomiting.  Genitourinary:  Negative for dysuria and hematuria.  Musculoskeletal:  Negative for arthralgias and back pain.  Skin:  Negative for color change and rash.  Neurological:  Positive for tremors and syncope. Negative for seizures.  All other systems reviewed and are negative.    Physical Exam Triage Vital Signs ED Triage Vitals  Encounter Vitals Group     BP 12/07/23 1628 (!) 168/97     Systolic BP Percentile --      Diastolic BP Percentile --      Pulse Rate 12/07/23 1628 61     Resp 12/07/23 1628 20     Temp 12/07/23 1628 97.7 F (36.5 C)     Temp Source 12/07/23  1628 Oral     SpO2 12/07/23 1628 96 %     Weight --      Height --      Head Circumference --      Peak Flow --      Pain Score 12/07/23 1626 0     Pain Loc --      Pain Education --      Exclude from Growth Chart --    No data found.  Updated Vital Signs BP (!) 168/97 (BP Location: Right Arm)   Pulse 61   Temp 97.7 F (36.5 C) (Oral)   Resp 20   SpO2 96%   Visual Acuity Right Eye Distance:   Left Eye Distance:   Bilateral Distance:    Right Eye Near:   Left Eye Near:    Bilateral Near:     Physical Exam Vitals and nursing note reviewed.   Constitutional:      General: He is not in acute distress.    Appearance: He is well-developed. He is not ill-appearing or toxic-appearing.  HENT:     Head: Normocephalic and atraumatic.     Right Ear: Hearing, tympanic membrane, ear canal and external ear normal.     Left Ear: Hearing, tympanic membrane, ear canal and external ear normal.     Nose: No congestion or rhinorrhea.     Right Sinus: No maxillary sinus tenderness or frontal sinus tenderness.     Left Sinus: No maxillary sinus tenderness or frontal sinus tenderness.     Mouth/Throat:     Lips: Pink.     Mouth: Mucous membranes are moist.     Pharynx: Uvula midline. No oropharyngeal exudate or posterior oropharyngeal erythema.     Tonsils: No tonsillar exudate.  Eyes:     Conjunctiva/sclera: Conjunctivae normal.     Pupils: Pupils are equal, round, and reactive to light.  Cardiovascular:     Rate and Rhythm: Normal rate and regular rhythm.     Heart sounds: S1 normal and S2 normal. No murmur heard. Pulmonary:     Effort: Pulmonary effort is normal. No respiratory distress.     Breath sounds: Normal breath sounds. No decreased breath sounds, wheezing, rhonchi or rales.  Abdominal:     General: Bowel sounds are normal.     Palpations: Abdomen is soft.     Tenderness: There is no abdominal tenderness.  Musculoskeletal:        General: No swelling.     Cervical back: Neck supple.  Lymphadenopathy:     Head:     Right side of head: No submental, submandibular, tonsillar, preauricular or posterior auricular adenopathy.     Left side of head: No submental, submandibular, tonsillar, preauricular or posterior auricular adenopathy.     Cervical: No cervical adenopathy.     Right cervical: No superficial cervical adenopathy.    Left cervical: No superficial cervical adenopathy.  Skin:    General: Skin is warm and dry.     Capillary Refill: Capillary refill takes less than 2 seconds.     Findings: No rash.  Neurological:      Mental Status: He is alert and oriented to person, place, and time.  Psychiatric:        Mood and Affect: Mood normal.      UC Treatments / Results  Labs (all labs ordered are listed, but only abnormal results are displayed) Labs Reviewed  POCT URINALYSIS DIP (MANUAL ENTRY) - Abnormal; Notable for the following components:  Result Value   Spec Grav, UA >=1.030 (*)    Blood, UA small (*)    Protein Ur, POC =30 (*)    All other components within normal limits    EKG   Radiology No results found.  Procedures ED EKG  Date/Time: 12/07/2023 5:06 PM  Performed by: Prescilla Sours, FNP Authorized by: Prescilla Sours, FNP   ECG interpreted by ED Physician in the absence of a cardiologist: yes Prescilla Sours, FNP)   Previous ECG:    Previous ECG:  Unavailable Interpretation:    Interpretation: abnormal     Details:  Atrial fibrillation with paced beats at a HR of 60 BPM Rate:    ECG rate:  60   ECG rate assessment: normal   Rhythm:    Rhythm: atrial fibrillation and paced   Pacing:    Capture:  Complete   Type of pacing:  Ventricular Ectopy:    Ectopy: none   QRS:    QRS axis:  Normal   QRS intervals:  Normal   QRS conduction: normal   ST segments:    ST segments:  Normal T waves:    T waves: normal   Q waves:    Abnormal Q-waves: not present    (including critical care time)  Medications Ordered in UC Medications - No data to display  Initial Impression / Assessment and Plan / UC Course  I have reviewed the triage vital signs and the nursing notes.  Pertinent labs & imaging results that were available during my care of the patient were reviewed by me and considered in my medical decision making (see chart for details).     ECG showed atrial fibrillation with a paced heart rate of 60.  Urine shows signs of dehydration.  Encouraged to get plenty of fluids, 48 to 64 ounces a day.  Encouraged to follow-up with cardiology.  Encouraged to be very cautious about  where he goes alone because if he passes out he could have a fall and really get injured.  Follow-up here or with cardiology if symptoms do not improve, worsen or new symptoms occur. Final Clinical Impressions(s) / UC Diagnoses   Final diagnoses:  Syncope, unspecified syncope type  Control of atrial fibrillation with pacemaker [facility])  Dehydration     Discharge Instructions      Urinalysis shows signs of dehydration.  Needs to get 48 to 64 ounces of fluids daily.  EKG shows atrial fibrillation with a paced ventricular response of 60 bpm.  Follow-up with cardiology.  Follow-up here if symptoms do not improve, worsen or new symptoms occur.     ED Prescriptions   None    PDMP not reviewed this encounter.   Prescilla Sours, FNP 12/07/23 [phone]

## 2023-12-07 NOTE — ED Triage Notes (Signed)
 Pt reports dizziness, he has a extensive medical history, pt reports his heart goes in and out of a fib. Pt had a cardio eversion on 02/27 was told on march 6 that his heart was back in Afib. Was at a storage unit office today and he reports he passed out. Wife reports he had a shaking episode prior to his arrival today.

## 2023-12-14 ENCOUNTER — Ambulatory Visit: Payer: Medicare Other | Attending: Cardiology | Admitting: Cardiology

## 2023-12-14 ENCOUNTER — Encounter: Payer: Self-pay | Admitting: Cardiology

## 2023-12-14 VITALS — BP 142/98 | HR 78 | Ht 68.0 in | Wt 160.2 lb

## 2023-12-14 DIAGNOSIS — E785 Hyperlipidemia, unspecified: Secondary | ICD-10-CM | POA: Insufficient documentation

## 2023-12-14 DIAGNOSIS — I2510 Atherosclerotic heart disease of native coronary artery without angina pectoris: Secondary | ICD-10-CM | POA: Insufficient documentation

## 2023-12-14 DIAGNOSIS — Z7901 Long term (current) use of anticoagulants: Secondary | ICD-10-CM | POA: Diagnosis present

## 2023-12-14 DIAGNOSIS — I739 Peripheral vascular disease, unspecified: Secondary | ICD-10-CM | POA: Insufficient documentation

## 2023-12-14 DIAGNOSIS — Z950 Presence of cardiac pacemaker: Secondary | ICD-10-CM | POA: Diagnosis present

## 2023-12-14 NOTE — Progress Notes (Signed)
 Cardiology Office Note:    Date:  12/14/2023   ID:  Louis Barton, DOB May 19, 1941, MRN [id number]  PCP:  Gordan Payment., MD  Cardiologist:  Gypsy Balsam, MD    Referring MD: Gordan Payment., MD   Chief Complaint  Patient presents with   Follow-up    History of Present Illness:    Louis Barton is a 83 y.o. male   with quite complex past medical history.  That include coronary artery disease, mild nonobstructive, cardiac catheterization done in May 2022 showed 20% mid and distal circumflex as well as 20% of mid LAD lesion.  Also history of essential hypertension first-degree AV block, status post bilateral carotic endarterectomy, non-small cell lung cancer status post wedge resection and right lower lobe, remote history of TIA.  Recently he ended up being in [facility] because of episode of atrial flutter with slow ventricular rate.  He did have TEE done and then eventually electrical cardioversion.  Few months ago he came to hospital with confusion and suspicion for CVA/TIA has been raised however future evaluation showed found that he was positive for COVID-19.  Luckily recovered quite nicely.  While he was in the hospital he was bradycardic with 2-1 AV conduction.  He was eventually referred to EP team for consideration of pacemaker implantation.  Eventually end of November of last year he did have pacemaker implantation.  There was a problem with the lead to the activity will need to be revised after that he was noted to have typical atrial flutter and in March of this year he did have cardioversion and seems to maintain sinus rhythm since then.  He comes today to follow-up.  Comes today to months for follow-up since the time of seeing him last time few events happen for several he did have cardioversion however he is back in atrial fibrillation.  Also week ago he ended up going to the hospital because of episode of syncope it is really very difficult to get a good history from him I do not  know exactly what happened.  Apparently he went to his toleration which was seen in the afternoon and then he passed out he is not even sure exactly he passed out he was described to me multiple episodes when he kind of falling asleep remaining that he could be talking to somebody sitting in the chair and then simply fell asleep when somebody pulled him start talking to him that he wakes up.  Denies have any palpitation no tightness squeezing pressure burning chest, multiple medication has been withdrawn because of low blood pressure.  Swelling of lower extremities ~he managed this with elastic stockings  Past Medical History:  Diagnosis Date   Abnormal stress test    Acute encephalopathy 05/05/2021   Aftercare following surgery of the circulatory system, NEC 12/19/2013   Arteriosclerosis of coronary artery 11/16/2015   Formatting of this note might be different from the original.  Formatting of this note might be different from the original.  Formatting of this note might be different from the original.  On imaging;  Formatting of this note might be different from the original.  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  02/2021  Formatting of this note might be different from the original.  Formatting of this note might be different from the original.  02/2021     Chronic kidney disease    cysts on both kidneys, seeing Pasty Arch in [place], [facility]     Coronary arteriosclerosis 11/16/2015   Formatting of this note might be different from the original. On imaging;   COVID 05/05/2021   COVID-19 virus infection 05/05/2021   Cramps of left lower extremity-Calf  > right calf 01/01/2015   Cyst, meninges, spinal 11/16/2015   Degeneration of lumbar intervertebral disc 11/16/2015   Formatting of this note might be different from the original. signficant DDD present with vacuum effect L4-5 and L5-S1 flat discs and anterior spurring noted   Dizziness 05/06/2020   Dyslipidemia 03/18/2015   Dyspnea on exertion 04/02/2020   Ear itch 05/14/2018   Edema 11/16/2015   Edema of both lower extremities due to peripheral venous insufficiency 02/03/2020   Elevated tumor markers 12/15/2017   Erectile dysfunction 11/16/2015   First degree AV block 10/20/2020   Gait abnormality 04/19/2022   Heart block 05/05/2021   Heart failure (HCC)    History of hiatal hernia    seen on last scan- as slight    History of thrombocytopenia    History of TIAs    Hyperlipidemia    Hypertension    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 [place]. And Dr. Truddie Hidden of this note might be different from the original.  Formatting of this note might be different from the original.  Stage 1A. [place]. And Dr. Truddie Hidden of this note might be different from the original.  Stage 1A. [place]. And Dr. Sherene Sires Adenocarcinoma (non-small cell). Observation.     Malignant neoplasm of left lung (HCC) 12/08/2017   Stage 1A. [place]. And Dr. 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  Formatting of this note might be different from the original.  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 Renal parapelvic cysts 10/06/16 on CT , right kidney midpole 1.2 cm, right kidney anterior pole 1.1 cm, and to the left kidney lower pole anterior position 3.5cm all felt to be benign   Right bundle branch block 10/20/2020   Right lower  lobe pulmonary nodule 11/09/2017   Secondary hypercoagulable state (HCC) 08/11/2021   Shingles    internal- obstruction of bowels & gallbladder   Sinus bradycardia 05/05/2021   Solitary pulmonary nodule 11/16/2016   CT ABd 03/24/09 linerar densities in RLL and RML c/w scarring o/w clear CT Abd 10/20/16 c/w 7 mm nodule  - CT chest 02/13/2017 :  slt enlarged to 9 mm   - PET 04/04/17 :1. These slowly enlarging 9 mm right lower lobe pulmonary nodule along the right hemidiaphragm does not appear hypermetabolic today. Location adjacent to the hemidiaphragm can cause false negatives due to motion artifact related to the   Sprain of left ankle 05/21/2021   Stage 3a chronic kidney disease (HCC) 06/17/2020   Stroke (HCC)    Swelling of limb-Left foot 01/01/2015   Symptomatic bradycardia 03/18/2015   Thrombocytopenia (HCC) 11/16/2015   Typical atrial flutter (HCC) 03/19/2021   Vitamin B12 deficiency 06/21/2017   Weakness of left lower extremity 09/21/2023    Past Surgical History:  Procedure Laterality Date   adenocarcinoma  2019   Removed   CARDIOVERSION N/A 03/22/2021   Procedure: CARDIOVERSION;  Surgeon:  Lewayne Bunting, MD;  Location: [facility] ENDOSCOPY;  Service: Cardiovascular;  Laterality: N/A;   CARDIOVERSION N/A 12/15/2021   Procedure: CARDIOVERSION;  Surgeon: Maisie Fus, MD;  Location: [facility] ENDOSCOPY;  Service: Cardiovascular;  Laterality: N/A;   CARDIOVERSION N/A 11/23/2023   Procedure: CARDIOVERSION;  Surgeon: Jake Bathe, MD;  Location: [facility] INVASIVE CV LAB;  Service: Cardiovascular;  Laterality: N/A;   CAROTID ENDARTERECTOMY  2005   Left carotid by Dr. Darrick Penna   CAROTID ENDARTERECTOMY  04/25/11   Right Carotid by Dr. Darrick Penna   COLONOSCOPY  02/23/2017   Colonic polyp status post polypectomy. Pancolonic diverticulosis predominatly in the sigmoid colon. Tubular adenoma   LEAD REVISION/REPAIR N/A 08/16/2021   Procedure: LEAD REVISION/REPAIR;  Surgeon: Regan Lemming, MD;  Location: [facility] INVASIVE CV LAB;  Service: Cardiovascular;  Laterality: N/A;   LEFT HEART CATH AND CORONARY ANGIOGRAPHY N/A 02/08/2021   Procedure: LEFT HEART CATH AND CORONARY ANGIOGRAPHY;  Surgeon: Kathleene Hazel, MD;  Location: [facility] INVASIVE CV LAB;  Service: Cardiovascular;  Laterality: N/A;   PACEMAKER IMPLANT N/A 07/28/2021   Procedure: PACEMAKER IMPLANT;  Surgeon: Regan Lemming, MD;  Location: [facility] INVASIVE CV LAB;  Service: Cardiovascular;  Laterality: N/A;   Septoplasty, nasal w/ submucosal resection  1960   SQUAMOUS CELL CARCINOMA EXCISION     pt said numerous times   TEE WITHOUT CARDIOVERSION N/A 03/22/2021   Procedure: TRANSESOPHAGEAL ECHOCARDIOGRAM (TEE);  Surgeon: Lewayne Bunting, MD;  Location: [facility] ENDOSCOPY;  Service: Cardiovascular;  Laterality: N/A;   TONSILLECTOMY     VIDEO ASSISTED THORACOSCOPY (VATS)/WEDGE RESECTION Right 11/09/2017   Procedure: RIGHT VIDEO ASSISTED THORACOSCOPY WITH Sabino Snipes RESECTION;  Surgeon: Loreli Slot, MD;  Location: [facility] OR;  Service: Thoracic;  Laterality: Right;    Current Medications: Current Meds  Medication Sig   colesevelam (WELCHOL) 625 MG tablet  Take 1,250 mg by mouth 2 (two) times daily with a meal.   Rivaroxaban (XARELTO) 15 MG TABS tablet Take 1 tablet (15 mg total) by mouth daily with supper.   rosuvastatin (CRESTOR) 40 MG tablet Take 1 tablet (40 mg total) by mouth at bedtime.     Allergies:   Patient has no known allergies.   Social History   Socioeconomic History   Marital status: Widowed    Spouse name: Not on file   Number  of children: 2   Years of education: Not on file   Highest education level: Not on file  Occupational History   Not on file  Tobacco Use   Smoking status: Former    Current packs/day: 0.00    Average packs/day: 1 pack/day for 16.0 years (16.0 ttl pk-yrs)    Types: Cigarettes    Start date: 09/26/1956    Quit date: 09/26/1972    Years since quitting: 51.2   Smokeless tobacco: Never   Tobacco comments:    Former smoker 08/11/21  Vaping Use   Vaping status: Never Used  Substance and Sexual Activity   Alcohol use: Not Currently    Alcohol/week: 7.0 standard drinks of alcohol    Types: 7 Shots of liquor per week    Comment: 1 shot daily 08/11/21   Drug use: No   Sexual activity: Not Currently  Other Topics Concern   Not on file  Social History Narrative   Not on file   Social Drivers of Health   Financial Resource Strain: Not on file  Food Insecurity: No Food Insecurity (09/05/2023)   Hunger Vital Sign    Worried About Running Out of Food in the Last Year: Never true    Ran Out of Food in the Last Year: Never true  Transportation Needs: No Transportation Needs (09/05/2023)   PRAPARE - Administrator, Civil Service (Medical): No    Lack of Transportation (Non-Medical): No  Physical Activity: Not on file  Stress: Not on file  Social Connections: Not on file     Family History: The patient's family history includes Cancer in his brother and sister; Cancer (age of onset: 33) in his mother; Deep vein thrombosis in his mother; Heart attack in his father and mother; Heart  disease in his mother; Hyperlipidemia in his mother; Hypertension in his mother; Other in his mother and sister; Stroke in his mother; Varicose Veins in his mother. There is no history of Colon cancer, Esophageal cancer, Rectal cancer, or Stomach cancer. ROS:   Please see the history of present illness.    All 14 point review of systems negative except as described per history of present illness  EKGs/Labs/Other Studies Reviewed:         Recent Labs: 12/19/2022: TSH 2.530 03/15/2023: NT-Pro BNP 817 09/04/2023: ALT 23; Magnesium 1.9 09/05/2023: Platelets 118 11/23/2023: BUN 20; Creatinine, Ser 1.20; Hemoglobin 15.3; Potassium 4.1; Sodium 143  Recent Lipid Panel    Component Value Date/Time   CHOL 159 11/21/2018 0836   TRIG 82 11/21/2018 0836   HDL 59 11/21/2018 0836   CHOLHDL 2.7 11/21/2018 0836   LDLCALC 84 11/21/2018 0836    Physical Exam:    VS:  BP (!) 142/98 (BP Location: Right Arm, Patient Position: Sitting)   Pulse 78   Ht 5\' 8"  (1.727 m)   Wt 160 lb 3.2 oz (72.7 kg)   SpO2 94%   BMI 24.36 kg/m     Wt Readings from Last 3 Encounters:  12/14/23 160 lb 3.2 oz (72.7 kg)  11/09/23 161 lb (73 kg)  11/02/23 161 lb (73 kg)     GEN:  Well nourished, well developed in no acute distress HEENT: Normal NECK: No JVD; No carotid bruits LYMPHATICS: No lymphadenopathy CARDIAC: RRR, no murmurs, no rubs, no gallops RESPIRATORY:  Clear to auscultation without rales, wheezing or rhonchi  ABDOMEN: Soft, non-tender, non-distended MUSCULOSKELETAL:  No edema; No deformity  SKIN: Warm and dry LOWER EXTREMITIES: 1+ NEUROLOGIC:  Alert and oriented x 3 PSYCHIATRIC:  Normal affect   ASSESSMENT:    1. Coronary artery disease involving native coronary artery of native heart without angina pectoris   2. PAD (peripheral artery disease) (HCC)   3. Chronic anticoagulation   4. Dyslipidemia   5. Pacemaker   6. Arteriosclerosis of coronary artery    PLAN:    In order of problems listed  above:  Coronary disease stable from that point review. Paroxysmal atrial fibrillation back in atrial fibrillation.  Will do interrogation of the device to see how long he is being in normal rhythm he tells me that for first 24 hours after cardioversion he felt great if there is the case we may be forced to be more aggressive in keeping him in sinus rhythm however started getting impression that it may be not what it is and may require too much effort trying to keep him in normal rhythm but if he truly felt better because of normal rhythm that will be indication to be more aggressive. Chronic anticoagulation will continue. Dyslipidemia still taking statin. Some cognitive issue concerning.  He is thinking about seeing her primary care physician for it   Medication Adjustments/Labs and Tests Ordered: Current medicines are reviewed at length with the patient today.  Concerns regarding medicines are outlined above.  No orders of the defined types were placed in this encounter.  Medication changes: No orders of the defined types were placed in this encounter.   Signed, Georgeanna Lea, MD, [facility] 12/14/2023 2:30 PM    [facility]

## 2023-12-14 NOTE — Patient Instructions (Signed)

## 2023-12-18 ENCOUNTER — Telehealth: Payer: Self-pay

## 2023-12-18 NOTE — Telephone Encounter (Signed)
 Spoke with pts daughter per [facility] and advised per Dr. Elberta Fortis and Dr. Bing Matter that pt can have MRI with his type of pacemaker.

## 2023-12-20 ENCOUNTER — Ambulatory Visit: Attending: Cardiology

## 2023-12-20 VITALS — BP 132/74 | HR 96 | Ht 68.0 in | Wt 160.0 lb

## 2023-12-20 DIAGNOSIS — I5032 Chronic diastolic (congestive) heart failure: Secondary | ICD-10-CM

## 2023-12-20 DIAGNOSIS — E785 Hyperlipidemia, unspecified: Secondary | ICD-10-CM

## 2023-12-20 DIAGNOSIS — I739 Peripheral vascular disease, unspecified: Secondary | ICD-10-CM

## 2023-12-20 NOTE — Progress Notes (Signed)
   Nurse Visit   Date of Encounter: 12/20/2023 ID: Louis Barton, DOB 04/10/1941, MRN [id number]  PCP:  Gordan Payment., MD   [facility] Providers Cardiologist:  Gypsy Balsam, MD Electrophysiologist:  Regan Lemming, MD      Visit Details   VS:  BP 132/74 (BP Location: Left Arm, Patient Position: Sitting)   Pulse 96   Ht 5\' 8"  (1.727 m)   Wt 160 lb (72.6 kg)   SpO2 97%   BMI 24.33 kg/m  , BMI Body mass index is 24.33 kg/m.  Wt Readings from Last 3 Encounters:  12/20/23 160 lb (72.6 kg)  12/14/23 160 lb 3.2 oz (72.7 kg)  11/09/23 161 lb (73 kg)     Reason for visit: Perform vitals and lab work Performed today: Research scientist (life sciences), Lab work, Public relations account executive consulted Changes (medications, testing, etc.) : None Length of Visit: 25 minutes    Medications Adjustments/Labs and Tests Ordered: Orders Placed This Encounter  Procedures   TSH   Comp Met (CMET)   Pro b natriuretic peptide (BNP)   No orders of the defined types were placed in this encounter.    Signed, Samson Frederic, RN  12/20/2023 4:15 PM

## 2023-12-21 ENCOUNTER — Ambulatory Visit (HOSPITAL_COMMUNITY)
Admission: RE | Admit: 2023-12-21 | Discharge: 2023-12-21 | Disposition: A | Discharge status: Home / Self Care | Payer: Medicare Other | Source: Ambulatory Visit | Attending: Internal Medicine | Admitting: Internal Medicine

## 2023-12-21 VITALS — BP 160/96 | HR 83 | Ht 68.0 in | Wt 159.8 lb

## 2023-12-21 DIAGNOSIS — Z79899 Other long term (current) drug therapy: Secondary | ICD-10-CM | POA: Diagnosis not present

## 2023-12-21 DIAGNOSIS — I779 Disorder of arteries and arterioles, unspecified: Secondary | ICD-10-CM | POA: Insufficient documentation

## 2023-12-21 DIAGNOSIS — Z950 Presence of cardiac pacemaker: Secondary | ICD-10-CM | POA: Diagnosis not present

## 2023-12-21 DIAGNOSIS — D6869 Other thrombophilia: Secondary | ICD-10-CM | POA: Diagnosis not present

## 2023-12-21 DIAGNOSIS — I441 Atrioventricular block, second degree: Secondary | ICD-10-CM | POA: Diagnosis not present

## 2023-12-21 DIAGNOSIS — N1831 Chronic kidney disease, stage 3a: Secondary | ICD-10-CM | POA: Insufficient documentation

## 2023-12-21 DIAGNOSIS — I2510 Atherosclerotic heart disease of native coronary artery without angina pectoris: Secondary | ICD-10-CM | POA: Diagnosis not present

## 2023-12-21 DIAGNOSIS — Z8673 Personal history of transient ischemic attack (TIA), and cerebral infarction without residual deficits: Secondary | ICD-10-CM | POA: Insufficient documentation

## 2023-12-21 DIAGNOSIS — I130 Hypertensive heart and chronic kidney disease with heart failure and stage 1 through stage 4 chronic kidney disease, or unspecified chronic kidney disease: Secondary | ICD-10-CM | POA: Diagnosis not present

## 2023-12-21 DIAGNOSIS — I4892 Unspecified atrial flutter: Secondary | ICD-10-CM | POA: Diagnosis not present

## 2023-12-21 DIAGNOSIS — I5032 Chronic diastolic (congestive) heart failure: Secondary | ICD-10-CM | POA: Insufficient documentation

## 2023-12-21 DIAGNOSIS — Z7901 Long term (current) use of anticoagulants: Secondary | ICD-10-CM | POA: Diagnosis not present

## 2023-12-21 DIAGNOSIS — I4819 Other persistent atrial fibrillation: Secondary | ICD-10-CM | POA: Diagnosis present

## 2023-12-21 LAB — COMPREHENSIVE METABOLIC PANEL WITH GFR
ALT: 16 IU/L (ref 0–44)
AST: 22 IU/L (ref 0–40)
Albumin: 3.8 g/dL (ref 3.7–4.7)
Alkaline Phosphatase: 69 IU/L (ref 44–121)
BUN/Creatinine Ratio: 16 (ref 10–24)
BUN: 16 mg/dL (ref 8–27)
Bilirubin Total: 0.5 mg/dL (ref 0.0–1.2)
CO2: 24 mmol/L (ref 20–29)
Calcium: 8.6 mg/dL (ref 8.6–10.2)
Chloride: 103 mmol/L (ref 96–106)
Creatinine, Ser: 1 mg/dL (ref 0.76–1.27)
Globulin, Total: 1.9 g/dL (ref 1.5–4.5)
Glucose: 91 mg/dL (ref 70–99)
Potassium: 4.2 mmol/L (ref 3.5–5.2)
Sodium: 140 mmol/L (ref 134–144)
Total Protein: 5.7 g/dL — ABNORMAL LOW (ref 6.0–8.5)
eGFR: 74 mL/min/{1.73_m2} (ref >59)

## 2023-12-21 LAB — TSH: TSH: 2.72 u[IU]/mL (ref 0.450–4.500)

## 2023-12-21 LAB — PRO B NATRIURETIC PEPTIDE: NT-Pro BNP: 2585 pg/mL — ABNORMAL HIGH (ref 0–486)

## 2023-12-21 MED ORDER — FUROSEMIDE 20 MG PO TABS: 20.0000 mg | ORAL_TABLET | Freq: Every day | ORAL | Status: DC | PRN

## 2023-12-21 MED ORDER — POTASSIUM CHLORIDE CRYS ER 10 MEQ PO TBCR: 10.0000 meq | EXTENDED_RELEASE_TABLET | Freq: Every day | ORAL | 1 refills | Status: DC | PRN

## 2023-12-21 NOTE — Progress Notes (Signed)
 Primary Care Physician: Gordan Payment., MD Primary Cardiologist: Dr Bing Matter  Primary Electrophysiologist: Dr Elberta Fortis Referring Physician: Dr Maretta Los is a 83 y.o. 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 Patient underwent PPM implant on 07/28/21 with Dr Elberta Fortis. His device interrogation at his wound check showed 36% atrial flutter burden. He has know atrial flutter and has been on Eliquis for a CHADS2VASC score of 6. He is unaware of his afib today. He does have some fatigue and dyspnea with exertion but this predates the onset of his afib. Patient admits to reaching overhead after PPM implant just prior to onset of continuous hiccups. Dr. Elberta Fortis made aware and patient is pending lead revision.  On follow up 12/21/23, he is currently in Afib. He is s/p successful DCCV on 11/23/23. He notes he went back into Afib about 10 days after cardioversion. Friend and patient do not specifically notice he had any improvement post cardioversion. Patient does tell me he felt much worse historically when he was taking amiodarone (he was dizzy more frequently). He does have intermittent dizziness but this sounds per patient to be chronic and not necessarily tied to Afib. No missed doses of Xarelto 15 mg daily.   Today, he denies symptoms of palpitations, chest pain, orthopnea, PND, lower extremity edema, dizziness, presyncope, syncope, snoring, daytime somnolence, bleeding, or neurologic sequela. The patient is tolerating medications without difficulties and is otherwise without complaint today.    Atrial Fibrillation Risk Factors:  he does not have symptoms or diagnosis of sleep apnea. he does not have a history of rheumatic fever. he does have a history of alcohol use.   he has a BMI  of Body mass index is 24.3 kg/m.[name] Filed Weights   12/21/23 0854  Weight: 72.5 kg     Family History  Problem Relation Age of Onset   Cancer Mother 56       Breast   Stroke Mother    Hyperlipidemia Mother    Other Mother        varicose veins   Heart attack Mother    Heart disease Mother        Left ankle swelling   Hypertension Mother    Deep vein thrombosis Mother    Varicose Veins Mother    Heart attack Father    Other Sister        Tumor in Lung and Brain   Cancer Sister        Tumor   Lung  and  Brain   Cancer Brother        BCC-SCC-Merkle Cell   Colon cancer Neg Hx    Esophageal cancer Neg Hx    Rectal cancer Neg Hx    Stomach cancer Neg Hx     Atrial Fibrillation Management history:  Previous antiarrhythmic drugs: none Previous cardioversions: 03/22/21, 11/23/23 Previous ablations: none CHADS2VASC score: 6 Anticoagulation history: Eliquis   Past Medical History:  Diagnosis Date   Abnormal stress test    Acute encephalopathy 05/05/2021   Aftercare following surgery of the circulatory system, NEC 12/19/2013   Arteriosclerosis of coronary artery 11/16/2015   Formatting of this note might be different from the original.  Formatting of this note might be different from the original.  Formatting of this  note might be different from the original.  On imaging;  Formatting of this note might be different from the original.  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  02/2021  Formatting of this note might be different from the original.  Formatting of this note might be different from the original.  02/2021     Chronic kidney disease    cysts on both kidneys, seeing Pasty Arch in [place], [facility]    Coronary arteriosclerosis 11/16/2015   Formatting of this note might be different from the original. On imaging;   COVID 05/05/2021   COVID-19 virus infection 05/05/2021   Cramps of left lower extremity-Calf  > right calf 01/01/2015   Cyst, meninges, spinal 11/16/2015   Degeneration of lumbar intervertebral disc 11/16/2015   Formatting of this note might be different from the original. signficant DDD present with vacuum effect L4-5 and L5-S1 flat discs and anterior spurring noted   Dizziness 05/06/2020   Dyslipidemia 03/18/2015   Dyspnea on exertion 04/02/2020   Ear itch 05/14/2018   Edema 11/16/2015   Edema of both lower extremities due to peripheral venous insufficiency 02/03/2020   Elevated tumor markers 12/15/2017   Erectile dysfunction 11/16/2015   First degree AV block 10/20/2020   Gait abnormality 04/19/2022   Heart block 05/05/2021   Heart failure (HCC)    History of hiatal hernia    seen on last scan- as slight    History of thrombocytopenia    History of TIAs    Hyperlipidemia    Hypertension    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 [place]. And Dr. Truddie Hidden of this note might be different from the original.  Formatting of this note might be different from the original.  Stage 1A. [place]. And Dr. Truddie Hidden of this note might be different from the original.  Stage 1A. [place]. And Dr. Sherene Sires Adenocarcinoma (non-small cell). Observation.     Malignant neoplasm of left lung  (HCC) 12/08/2017   Stage 1A. [place]. And Dr. 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  Formatting of this note might be different from the original.  DDD- aging     Paroxysmal atrial fibrillation (HCC) 12/23/2022  Persistent atrial fibrillation (HCC) 08/11/2021   Prediabetes 12/25/2018   Preop cardiovascular exam 04/14/2011   Renal cysts, acquired, bilateral 10/24/2016   Formatting of this note might be different from the original. 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 These slowly enlarging 9 mm right lower lobe pulmonary nodule along the right hemidiaphragm does not appear hypermetabolic today. Location adjacent to the hemidiaphragm can cause false negatives due to motion artifact related to the   Sprain of left ankle 05/21/2021   Stage 3a chronic kidney disease (HCC) 06/17/2020   Stroke (HCC)    Swelling of limb-Left foot 01/01/2015   Symptomatic bradycardia 03/18/2015   Thrombocytopenia (HCC) 11/16/2015    Typical atrial flutter (HCC) 03/19/2021   Vitamin B12 deficiency 06/21/2017   Weakness of left lower extremity 09/21/2023   Past Surgical History:  Procedure Laterality Date   adenocarcinoma  2019   Removed   CARDIOVERSION N/A 03/22/2021   Procedure: CARDIOVERSION;  Surgeon: Lewayne Bunting, MD;  Location: [facility] ENDOSCOPY;  Service: Cardiovascular;  Laterality: N/A;   CARDIOVERSION N/A 12/15/2021   Procedure: CARDIOVERSION;  Surgeon: Maisie Fus, MD;  Location: [facility] ENDOSCOPY;  Service: Cardiovascular;  Laterality: N/A;   CARDIOVERSION N/A 11/23/2023   Procedure: CARDIOVERSION;  Surgeon: Jake Bathe, MD;  Location: [facility] INVASIVE CV LAB;  Service: Cardiovascular;  Laterality: N/A;   CAROTID ENDARTERECTOMY  2005   Left carotid by Dr. Darrick Penna   CAROTID ENDARTERECTOMY  04/25/11   Right Carotid by Dr. Darrick Penna   COLONOSCOPY  02/23/2017   Colonic polyp status post polypectomy. Pancolonic diverticulosis predominatly in the sigmoid colon. Tubular adenoma   LEAD REVISION/REPAIR N/A 08/16/2021   Procedure: LEAD REVISION/REPAIR;  Surgeon: Regan Lemming, MD;  Location: [facility] INVASIVE CV LAB;  Service: Cardiovascular;  Laterality: N/A;   LEFT HEART CATH AND CORONARY ANGIOGRAPHY N/A 02/08/2021   Procedure: LEFT HEART CATH AND CORONARY ANGIOGRAPHY;  Surgeon: Kathleene Hazel, MD;  Location: [facility] INVASIVE CV LAB;  Service: Cardiovascular;  Laterality: N/A;   PACEMAKER IMPLANT N/A 07/28/2021   Procedure: PACEMAKER IMPLANT;  Surgeon: Regan Lemming, MD;  Location: [facility] INVASIVE CV LAB;  Service: Cardiovascular;  Laterality: N/A;   Septoplasty, nasal w/ submucosal resection  1960   SQUAMOUS CELL CARCINOMA EXCISION     pt said numerous times   TEE WITHOUT CARDIOVERSION N/A 03/22/2021   Procedure: TRANSESOPHAGEAL ECHOCARDIOGRAM (TEE);  Surgeon: Lewayne Bunting, MD;  Location: [facility] ENDOSCOPY;  Service: Cardiovascular;  Laterality: N/A;   TONSILLECTOMY     VIDEO ASSISTED THORACOSCOPY (VATS)/WEDGE  RESECTION Right 11/09/2017   Procedure: RIGHT VIDEO ASSISTED THORACOSCOPY WITH Sabino Snipes RESECTION;  Surgeon: Loreli Slot, MD;  Location: [facility] OR;  Service: Thoracic;  Laterality: Right;    Current Outpatient Medications  Medication Sig Dispense Refill   colesevelam (WELCHOL) 625 MG tablet Take 1,250 mg by mouth 2 (  two) times daily with a meal.     furosemide (LASIX) 20 MG tablet Take 1 tablet (20 mg total) by mouth daily as needed.     potassium chloride (KLOR-CON M) 10 MEQ tablet Take 1 tablet (10 mEq total) by mouth daily as needed (take on days lasix is taken). 15 tablet 1   Rivaroxaban (XARELTO) 15 MG TABS tablet Take 1 tablet (15 mg total) by mouth daily with supper. 30 tablet 3   rosuvastatin (CRESTOR) 40 MG tablet Take 1 tablet (40 mg total) by mouth at bedtime.     No current facility-administered medications for this encounter.    No Known Allergies  ROS- All systems are reviewed and negative except as per the HPI above.  Physical Exam: Vitals:   12/21/23 0854  BP: (!) 160/96  Pulse: 83  Weight: 72.5 kg  Height: 5\' 8"  (1.727 m)    GEN- The patient is well appearing, alert and oriented x 3 today.   Neck - no JVD or carotid bruit noted Lungs- Clear to ausculation bilaterally, normal work of breathing Heart- Regular rate and rhythm, no murmurs, rubs or gallops, PMI not laterally displaced Extremities- no clubbing, cyanosis, or edema Skin - no rash or ecchymosis noted   Wt Readings from Last 3 Encounters:  12/21/23 72.5 kg  12/20/23 72.6 kg  12/14/23 72.7 kg    EKG today demonstrates  Ventricular paced rhythm HR 83 BPM PR * QRS 148 ms QT/Qtc 442/519 ms  Echo 03/19/21 demonstrated   1. Left ventricular ejection fraction, by estimation, is 60 to 65%. The left ventricle has normal function. The left ventricle has no regional wall motion abnormalities. There is mild left ventricular hypertrophy. Left ventricular diastolic parameters are indeterminate.   2.  Right ventricular systolic function is normal. The right ventricular  size is normal.   3. The mitral valve is normal in structure. Mild mitral valve  regurgitation. No evidence of mitral stenosis.   4. The aortic valve is calcified. Aortic valve regurgitation is trivial.  Mild to moderate aortic valve sclerosis/calcification is present, without any evidence of aortic stenosis.   5. The inferior vena cava is normal in size with greater than 50%  respiratory variability, suggesting right atrial pressure of 3 mmHg.   Epic records are reviewed at length today  CHA2DS2-VASc Score = 5  The patient's score is based upon: CHF History: 1 HTN History: 1 Diabetes History: 0 Stroke History: 0 Vascular Disease History: 1 Age Score: 2 Gender Score: 0       ASSESSMENT AND PLAN: 1. Persistent Atrial Fibrillation/atrial flutter The patient's CHA2DS2-VASc score is 5, indicating a 7.2% annual risk of stroke.   S/p successful DCCV on 11/23/23.  He is currently in V paced rhythm. It is difficult to obtain accurate history from pleasant patient. From his description, he does not appear overtly symptomatic related to Afib. He does note some swelling and took a lasix pill he has at home yesterday. Recommend he take lasix 20 mg once daily x 5 days and will send him potassium 10 meq to take once daily x 5 days. If patient desired rhythm control, could re attempt amiodarone although it seems like he was dizzy on this last time so may not be recommended. He could consider Multaq. I am not sure if ablation candidate due to age. He would like to think over options of what to do and speak with Dr. Elberta Fortis. I will arrange follow up for him to discuss rhythm  control versus continuing a rate control strategy.     2. Secondary Hypercoagulable State (ICD10:  D68.69) The patient is at significant risk for stroke/thromboembolism based upon his CHA2DS2-VASc Score of 5.  Continue Apixaban (Eliquis).  No missed doses.    3. 2nd degree AV block S/p PPM, followed by Dr Elberta Fortis.   Follow up with Dr Elberta Fortis.   Justin Mend, PA-C Afib Clinic [facility] [address] [place], [place] [address] [phone] 12/21/2023 10:16 AM

## 2023-12-21 NOTE — Patient Instructions (Addendum)
 Take potassium on days you take lasix

## 2023-12-25 ENCOUNTER — Telehealth: Payer: Self-pay | Admitting: Cardiology

## 2023-12-25 ENCOUNTER — Encounter: Payer: Self-pay | Admitting: Emergency Medicine

## 2023-12-25 NOTE — Telephone Encounter (Signed)
 Patient stated he was returning a staff call.

## 2023-12-29 ENCOUNTER — Other Ambulatory Visit (HOSPITAL_COMMUNITY): Payer: Self-pay | Admitting: Internal Medicine

## 2024-01-01 ENCOUNTER — Encounter (INDEPENDENT_AMBULATORY_CARE_PROVIDER_SITE_OTHER): Payer: Self-pay | Admitting: Neurology

## 2024-01-01 ENCOUNTER — Other Ambulatory Visit: Payer: Self-pay | Admitting: Cardiology

## 2024-01-01 ENCOUNTER — Ambulatory Visit: Attending: Cardiology | Admitting: Cardiology

## 2024-01-01 VITALS — BP 138/78 | Wt 159.8 lb

## 2024-01-01 DIAGNOSIS — I2510 Atherosclerotic heart disease of native coronary artery without angina pectoris: Secondary | ICD-10-CM

## 2024-01-01 DIAGNOSIS — Z950 Presence of cardiac pacemaker: Secondary | ICD-10-CM

## 2024-01-01 DIAGNOSIS — G3184 Mild cognitive impairment, so stated: Secondary | ICD-10-CM

## 2024-01-01 DIAGNOSIS — R269 Unspecified abnormalities of gait and mobility: Secondary | ICD-10-CM | POA: Diagnosis not present

## 2024-01-01 DIAGNOSIS — I5032 Chronic diastolic (congestive) heart failure: Secondary | ICD-10-CM | POA: Diagnosis present

## 2024-01-01 MED ORDER — NAFTIFINE HCL 1 % EX GEL: CUTANEOUS | 0 refills | Status: DC

## 2024-01-01 NOTE — Progress Notes (Unsigned)
 Cardiology Office Note:    Date:  01/01/2024   ID:  Louis Barton, DOB [birth year], MRN [id number]  PCP:  Gordan Payment., MD  Cardiologist:  Gypsy Balsam, MD    Referring MD: Gordan Payment., MD   No chief complaint on file.   History of Present Illness:    Louis Barton is a 83 y.o. male past medical history significant for coronary disease mild nonobstructive cardiac cath in May 2022 showing 20% mid to distal circumflex as well as 20% mid LAD, also high degree AV block required pacing, likely has been complaining of significant swelling of lower extremities.  He was given some extra Lasix with partial relief he does not think it helped him at all.  He does have also some tinea on his lower extremities.  It bothers him a lot especially especially during the night  Past Medical History:  Diagnosis Date   Abnormal stress test    Acute encephalopathy 05/05/2021   Aftercare following surgery of the circulatory system, NEC 12/19/2013   Arteriosclerosis of coronary artery 11/16/2015   Formatting of this note might be different from the original.  Formatting of this note might be different from the original.  Formatting of this note might be different from the original.  On imaging;  Formatting of this note might be different from the original.  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  02/2021  Formatting of this note might be different from the original.   Formatting of this note might be different from the original.  02/2021     Chronic kidney disease    cysts on both kidneys, seeing Pasty Arch in [place], [facility]    Coronary arteriosclerosis 11/16/2015   Formatting of this note might be different from the original. On imaging;   COVID 05/05/2021   COVID-19 virus infection 05/05/2021   Cramps of left lower extremity-Calf  > right calf 01/01/2015   Cyst, meninges, spinal 11/16/2015   Degeneration of lumbar intervertebral disc 11/16/2015   Formatting of this note might be different from the original. signficant DDD present with vacuum effect L4-5 and L5-S1 flat discs and anterior spurring noted   Dizziness 05/06/2020   Dyslipidemia 03/18/2015   Dyspnea on exertion 04/02/2020   Ear itch 05/14/2018   Edema 11/16/2015   Edema of both lower extremities due to peripheral venous insufficiency 02/03/2020   Elevated tumor markers 12/15/2017   Erectile dysfunction 11/16/2015   First degree AV block 10/20/2020   Gait abnormality 04/19/2022   Heart block 05/05/2021   Heart failure (HCC)    History of hiatal hernia    seen on last scan- as slight    History of thrombocytopenia    History of TIAs    Hyperlipidemia    Hypertension    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   Irritable bowel syndrome 11/17/2016   rec  trial of citrucel/ diet 11/16/2016 >>>      Local edema 02/07/2018   Malaise and fatigue 12/08/2020   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 [place]. And Dr. Truddie Hidden of this note might be different from the original.  Formatting of this note might be different from the original.  Stage 1A. [place]. And Dr. Truddie Hidden of this note might be different from the original.  Stage 1A. [place]. And Dr. Sherene Sires Adenocarcinoma (non-small cell). Observation.     Malignant neoplasm of left lung (HCC) 12/08/2017   Stage 1A. [place]. And Dr. Sherene Sires   Mild cognitive impairment 03/31/2021   MRSA  carrier 09/28/2016   Multiple closed fractures of ribs of left side 09/05/2023   Near syncope 09/04/2023   Neuritis 10/24/2016   Occlusion and stenosis of carotid artery without mention of cerebral infarction 12/01/2011   Pacemaker 10/19/2021   PAD (peripheral artery disease) (HCC)    Paralabral cyst of hip 11/16/2015   DDD- aging   Formatting of this note might be different from the original.  Formatting of this note might be different from the original.  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 These slowly enlarging 9 mm right lower lobe pulmonary nodule along the right hemidiaphragm does not appear hypermetabolic today. Location adjacent to the hemidiaphragm can cause false negatives due to motion artifact related to the   Sprain of left ankle 05/21/2021   Stage 3a chronic kidney disease (HCC) 06/17/2020   Stroke (HCC)    Swelling of limb-Left foot 01/01/2015   Symptomatic bradycardia 03/18/2015   Thrombocytopenia (HCC) 11/16/2015   Typical atrial flutter (HCC) 03/19/2021   Vitamin B12 deficiency 06/21/2017   Weakness of left lower  extremity 09/21/2023    Past Surgical History:  Procedure Laterality Date   adenocarcinoma  2019   Removed   CARDIOVERSION N/A 03/22/2021   Procedure: CARDIOVERSION;  Surgeon: Lewayne Bunting, MD;  Location: [facility] ENDOSCOPY;  Service: Cardiovascular;  Laterality: N/A;   CARDIOVERSION N/A 12/15/2021   Procedure: CARDIOVERSION;  Surgeon: Maisie Fus, MD;  Location: [facility] ENDOSCOPY;  Service: Cardiovascular;  Laterality: N/A;   CARDIOVERSION N/A 11/23/2023   Procedure: CARDIOVERSION;  Surgeon: Jake Bathe, MD;  Location: [facility] INVASIVE CV LAB;  Service: Cardiovascular;  Laterality: N/A;   CAROTID ENDARTERECTOMY  2005   Left carotid by Dr. Darrick Penna   CAROTID ENDARTERECTOMY  04/25/11   Right Carotid by Dr. Darrick Penna   COLONOSCOPY  02/23/2017   Colonic polyp status post polypectomy. Pancolonic diverticulosis predominatly in the sigmoid  colon. Tubular adenoma   LEAD REVISION/REPAIR N/A 08/16/2021   Procedure: LEAD REVISION/REPAIR;  Surgeon: Regan Lemming, MD;  Location: [facility] INVASIVE CV LAB;  Service: Cardiovascular;  Laterality: N/A;   LEFT HEART CATH AND CORONARY ANGIOGRAPHY N/A 02/08/2021   Procedure: LEFT HEART CATH AND CORONARY ANGIOGRAPHY;  Surgeon: Kathleene Hazel, MD;  Location: [facility] INVASIVE CV LAB;  Service: Cardiovascular;  Laterality: N/A;   PACEMAKER IMPLANT N/A 07/28/2021   Procedure: PACEMAKER IMPLANT;  Surgeon: Regan Lemming, MD;  Location: [facility] INVASIVE CV LAB;  Service: Cardiovascular;  Laterality: N/A;   Septoplasty, nasal w/ submucosal resection  1960   SQUAMOUS CELL CARCINOMA EXCISION     pt said numerous times   TEE WITHOUT CARDIOVERSION N/A 03/22/2021   Procedure: TRANSESOPHAGEAL ECHOCARDIOGRAM (TEE);  Surgeon: Lewayne Bunting, MD;  Location: [facility] ENDOSCOPY;  Service: Cardiovascular;  Laterality: N/A;   TONSILLECTOMY     VIDEO ASSISTED THORACOSCOPY (VATS)/WEDGE RESECTION Right 11/09/2017   Procedure: RIGHT VIDEO ASSISTED THORACOSCOPY WITH Sabino Snipes RESECTION;  Surgeon:  Loreli Slot, MD;  Location: [facility] OR;  Service: Thoracic;  Laterality: Right;    Current Medications: No outpatient medications have been marked as taking for the 01/01/24 encounter (Office Visit) with Georgeanna Lea, MD.     Allergies:   Patient has no known allergies.   Social History   Socioeconomic History   Marital status: Widowed    Spouse name: Not on file   Number of children: 2   Years of education: Not on file   Highest education level: Not on file  Occupational History   Not on file  Tobacco Use   Smoking status: Former    Current packs/day: 0.00    Average packs/day: 1 pack/day for 16.0 years (16.0 ttl pk-yrs)    Types: Cigarettes    Start date: 09/26/1956    Quit date: 09/26/1972    Years since quitting: 51.2   Smokeless tobacco: Never   Tobacco comments:    Former smoker 08/11/21  Vaping Use   Vaping status: Never Used  Substance and Sexual Activity   Alcohol use: Not Currently    Alcohol/week: 7.0 standard drinks of alcohol    Types: 7 Shots of liquor per week    Comment: 1 shot daily 08/11/21   Drug use: No   Sexual activity: Not Currently  Other Topics Concern   Not on file  Social History Narrative   Not on file   Social Drivers of Health   Financial Resource Strain: Not on file  Food Insecurity: No Food Insecurity (09/05/2023)   Hunger Vital Sign    Worried About Running Out of Food in the Last Year: Never true    Ran Out of Food in the Last Year: Never true  Transportation Needs: No Transportation Needs (09/05/2023)   PRAPARE - Administrator, Civil Service (Medical): No    Lack of Transportation (Non-Medical): No  Physical Activity: Not on file  Stress: Not on file  Social Connections: Not on file     Family History: The patient's family history includes Cancer in his brother and sister; Cancer (age of onset: 1) in his mother; Deep vein thrombosis in his mother; Heart attack in his father and mother; Heart disease in  his mother; Hyperlipidemia in his mother; Hypertension in his mother; Other in his mother and sister; Stroke in his mother; Varicose Veins in his mother. There is no history of Colon cancer, Esophageal cancer, Rectal cancer, or Stomach cancer.  ROS:   Please see the history of present illness.    All 14 point review of systems negative except as described per history of present illness  EKGs/Labs/Other Studies Reviewed:         Recent Labs: 09/04/2023: Magnesium 1.9 09/05/2023: Platelets 118 11/23/2023: Hemoglobin 15.3 12/20/2023: ALT 16; BUN 16; Creatinine, Ser 1.00; NT-Pro BNP 2,585; Potassium 4.2; Sodium 140; TSH 2.720  Recent Lipid Panel    Component Value Date/Time   CHOL 159 11/21/2018 0836   TRIG 82 11/21/2018 0836   HDL 59 11/21/2018 0836   CHOLHDL 2.7 11/21/2018 0836   LDLCALC 84 11/21/2018 0836    Physical Exam:    VS:  BP 138/78 (BP Location: Right Arm, Patient Position: Sitting)   Wt 159 lb 12.8 oz (72.5 kg)   BMI 24.30 kg/m     Wt Readings from Last 3 Encounters:  01/01/24 159 lb 12.8 oz (72.5 kg)  12/21/23 159 lb 12.8 oz (72.5 kg)  12/20/23 160 lb (72.6 kg)     GEN:  Well nourished, well developed in no acute distress HEENT: Normal NECK: No JVD; No carotid bruits LYMPHATICS: No lymphadenopathy CARDIAC: RRR, no murmurs, no rubs, no gallops RESPIRATORY:  Clear to auscultation without rales, wheezing or rhonchi  ABDOMEN: Soft, non-tender, non-distended MUSCULOSKELETAL:  No edema; No deformity  SKIN: Warm and dry LOWER EXTREMITIES: 1+ swelling NEUROLOGIC:  Alert and oriented x 3 PSYCHIATRIC:  Normal affect   ASSESSMENT:    1. Chronic heart failure with preserved ejection fraction (HCC)   2. Coronary artery disease involving native coronary artery of native heart without angina pectoris   3. Pacemaker    PLAN:    In order of problems listed above:  Chronic congestive heart failure asking to start back on 20 mg Lasix will check Chem-7 today if Chem-7  is fine we may even go higher.  This is to give him symptomatology relief from swelling of lower extremities. Coronary artery disease only mild denies have any symptoms. Pacemaker present normal function   Medication Adjustments/Labs and Tests Ordered: Current medicines are reviewed at length with the patient today.  Concerns regarding medicines are outlined above.  No orders of the defined types were placed in this encounter.  Medication changes: No orders of the defined types were placed in this encounter.   Signed, Georgeanna Lea, MD, [facility] 01/01/2024 4:04 PM    [facility]

## 2024-01-01 NOTE — Patient Instructions (Addendum)
 Medication Instructions:   Naftin gel 1% apply to affected area twice daily for 2 weeks   Lab Work: BMP-today If you have labs (blood work) drawn today and your tests are completely normal, you will receive your results only by: MyChart Message (if you have MyChart) OR A paper copy in the mail If you have any lab test that is abnormal or we need to change your treatment, we will call you to review the results.   Testing/Procedures: None Ordered   Follow-Up: At [facility], you and your health needs are our priority.  As part of our continuing mission to provide you with exceptional heart care, we have created designated Provider Care Teams.  These Care Teams include your primary Cardiologist (physician) and Advanced Practice Providers (APPs -  Physician Assistants and Nurse Practitioners) who all work together to provide you with the care you need, when you need it.  We recommend signing up for the patient portal called "MyChart".  Sign up information is provided on this After Visit Summary.  MyChart is used to connect with patients for Virtual Visits (Telemedicine).  Patients are able to view lab/test results, encounter notes, upcoming appointments, etc.  Non-urgent messages can be sent to your provider as well.   To learn more about what you can do with MyChart, go to [email or web address].    Your next appointment:   Keep scheduled appt The format for your next appointment:   In Person  Provider:   Gypsy Balsam, MD    Other Instructions NA

## 2024-01-02 LAB — BASIC METABOLIC PANEL WITH GFR
BUN/Creatinine Ratio: 17 (ref 10–24)
BUN: 17 mg/dL (ref 8–27)
CO2: 24 mmol/L (ref 20–29)
Calcium: 8.7 mg/dL (ref 8.6–10.2)
Chloride: 104 mmol/L (ref 96–106)
Creatinine, Ser: 1.01 mg/dL (ref 0.76–1.27)
Glucose: 72 mg/dL (ref 70–99)
Potassium: 3.8 mmol/L (ref 3.5–5.2)
Sodium: 141 mmol/L (ref 134–144)
eGFR: 73 mL/min/{1.73_m2} (ref >59)

## 2024-01-02 NOTE — Telephone Encounter (Signed)
Please see the MyChart message reply(ies) for my assessment and plan.    This patient gave consent for this Medical Advice Message and is aware that it may result in a bill to [employer], as well as the possibility of receiving a bill for a co-payment or deductible. They are an established patient, but are not seeking medical advice exclusively about a problem treated during an in person or video visit in the last seven days. I did not recommend an in person or video visit within seven days of my reply.    I spent a total of 7 minutes cumulative time within 7 days through [employer].  Marcial Pacas, MD

## 2024-01-02 NOTE — Telephone Encounter (Signed)
 Medication sent as cream per Dr. Vanetta Shawl approval verbally.

## 2024-01-08 ENCOUNTER — Other Ambulatory Visit (HOSPITAL_COMMUNITY): Payer: Self-pay | Admitting: Internal Medicine

## 2024-01-08 ENCOUNTER — Other Ambulatory Visit (HOSPITAL_COMMUNITY): Payer: Self-pay | Admitting: *Deleted

## 2024-01-08 MED ORDER — METOPROLOL SUCCINATE ER 25 MG PO TB24: 12.5000 mg | ORAL_TABLET | Freq: Every day | ORAL | 2 refills | Status: DC

## 2024-01-09 NOTE — Telephone Encounter (Signed)
 no auth required sent to [facility] [phone]

## 2024-01-22 ENCOUNTER — Ambulatory Visit: Attending: Cardiology | Admitting: Cardiology

## 2024-01-22 VITALS — BP 140/90 | HR 75 | Ht 68.0 in | Wt 159.0 lb

## 2024-01-22 DIAGNOSIS — M79602 Pain in left arm: Secondary | ICD-10-CM | POA: Insufficient documentation

## 2024-01-22 DIAGNOSIS — I739 Peripheral vascular disease, unspecified: Secondary | ICD-10-CM | POA: Insufficient documentation

## 2024-01-22 DIAGNOSIS — I2510 Atherosclerotic heart disease of native coronary artery without angina pectoris: Secondary | ICD-10-CM | POA: Insufficient documentation

## 2024-01-22 DIAGNOSIS — D6869 Other thrombophilia: Secondary | ICD-10-CM | POA: Diagnosis present

## 2024-01-22 DIAGNOSIS — M7989 Other specified soft tissue disorders: Secondary | ICD-10-CM | POA: Insufficient documentation

## 2024-01-22 DIAGNOSIS — I4819 Other persistent atrial fibrillation: Secondary | ICD-10-CM | POA: Diagnosis present

## 2024-01-22 NOTE — Patient Instructions (Signed)
 Medication Instructions:  Your physician recommends that you continue on your current medications as directed. Please refer to the Current Medication list given to you today.  *If you need a refill on your cardiac medications before your next appointment, please call your pharmacy*   Lab Work: None Ordered If you have labs (blood work) drawn today and your tests are completely normal, you will receive your results only by: MyChart Message (if you have MyChart) OR A paper copy in the mail If you have any lab test that is abnormal or we need to change your treatment, we will call you to review the results.   Testing/Procedures: Your physician has requested that you have a lower or upper extremity venous duplex. This test is an ultrasound of the veins in the legs or arms. It looks at venous blood flow that carries blood from the heart to the legs or arms. Allow one hour for a Lower Venous exam. Allow thirty minutes for an Upper Venous exam. There are no restrictions or special instructions.  Please note: We ask at that you not bring children with you during ultrasound (echo/ vascular) testing. Due to room size and safety concerns, children are not allowed in the ultrasound rooms during exams. Our front office staff cannot provide observation of children in our lobby area while testing is being conducted. An adult accompanying a patient to their appointment will only be allowed in the ultrasound room at the discretion of the ultrasound technician under special circumstances. We apologize for any inconvenience.    Follow-Up: At [facility], you and your health needs are our priority.  As part of our continuing mission to provide you with exceptional heart care, we have created designated Provider Care Teams.  These Care Teams include your primary Cardiologist (physician) and Advanced Practice Providers (APPs -  Physician Assistants and Nurse Practitioners) who all work together to provide you with  the care you need, when you need it.  We recommend signing up for the patient portal called "MyChart".  Sign up information is provided on this After Visit Summary.  MyChart is used to connect with patients for Virtual Visits (Telemedicine).  Patients are able to view lab/test results, encounter notes, upcoming appointments, etc.  Non-urgent messages can be sent to your provider as well.   To learn more about what you can do with MyChart, go to [email or web address].    Your next appointment:   Keep scheduled appt  The format for your next appointment:   In Person  Provider:   Ralene Burger, MD    Other Instructions NA

## 2024-01-24 ENCOUNTER — Telehealth: Payer: Self-pay | Admitting: Cardiology

## 2024-01-24 ENCOUNTER — Ambulatory Visit (HOSPITAL_COMMUNITY)
Admission: RE | Admit: 2024-01-24 | Discharge: 2024-01-24 | Disposition: A | Discharge status: Home / Self Care | Source: Ambulatory Visit | Attending: Cardiology | Admitting: Cardiology

## 2024-01-24 DIAGNOSIS — M79602 Pain in left arm: Secondary | ICD-10-CM | POA: Insufficient documentation

## 2024-01-24 DIAGNOSIS — M7989 Other specified soft tissue disorders: Secondary | ICD-10-CM | POA: Insufficient documentation

## 2024-01-24 NOTE — Telephone Encounter (Signed)
 Pt came by the office and states that he has blood clots. Pt states they were talking with him about switching from Xarelto  to Eliquis . Advised that Dr. Gordan Latina is out of the office today and will return tomorrow. Please advise

## 2024-01-24 NOTE — Telephone Encounter (Signed)
 Unable to complete call. Message sent via secure chat to [facility].

## 2024-01-24 NOTE — Telephone Encounter (Signed)
 Pt left and advised to wait for Dr. Gordan Latina to review results.

## 2024-01-24 NOTE — Telephone Encounter (Signed)
 Caller Beauford Bounds) is reporting on preliminary results and notes patient is still in their office and wants to know next steps.

## 2024-01-25 ENCOUNTER — Ambulatory Visit (INDEPENDENT_AMBULATORY_CARE_PROVIDER_SITE_OTHER): Payer: Medicare Other

## 2024-01-25 DIAGNOSIS — I441 Atrioventricular block, second degree: Secondary | ICD-10-CM | POA: Diagnosis not present

## 2024-01-25 LAB — CUP PACEART REMOTE DEVICE CHECK
Battery Remaining Longevity: 111 mo
Battery Voltage: 3 V
Brady Statistic RA Percent Paced: 0.08 %
Brady Statistic RV Percent Paced: 98.99 %
Date Time Interrogation Session: 20250430222221
Implantable Lead Connection Status: 753985
Implantable Lead Connection Status: 753985
Implantable Lead Implant Date: 20221102
Implantable Lead Implant Date: 20221121
Implantable Lead Location: 753859
Implantable Lead Location: 753860
Implantable Lead Model: 3830
Implantable Lead Model: 5076
Implantable Pulse Generator Implant Date: 20221102
Lead Channel Impedance Value: 247 Ohm
Lead Channel Impedance Value: 304 Ohm
Lead Channel Impedance Value: 361 Ohm
Lead Channel Impedance Value: 437 Ohm
Lead Channel Pacing Threshold Amplitude: 0.625 V
Lead Channel Pacing Threshold Amplitude: 0.75 V
Lead Channel Pacing Threshold Pulse Width: 0.4 ms
Lead Channel Pacing Threshold Pulse Width: 0.4 ms
Lead Channel Sensing Intrinsic Amplitude: 1.125 mV
Lead Channel Sensing Intrinsic Amplitude: 1.125 mV
Lead Channel Sensing Intrinsic Amplitude: 10.625 mV
Lead Channel Sensing Intrinsic Amplitude: 10.625 mV
Lead Channel Setting Pacing Amplitude: 1.5 V
Lead Channel Setting Pacing Amplitude: 2 V
Lead Channel Setting Pacing Pulse Width: 0.4 ms
Lead Channel Setting Sensing Sensitivity: 0.9 mV
Zone Setting Status: 755011
Zone Setting Status: 755011

## 2024-01-25 MED ORDER — RIVAROXABAN 15 MG PO TABS: 15.0000 mg | ORAL_TABLET | Freq: Two times a day (BID) | ORAL | 0 refills | Status: DC

## 2024-01-25 MED ORDER — RIVAROXABAN 20 MG PO TABS: 20.0000 mg | ORAL_TABLET | Freq: Every day | ORAL | 3 refills | Status: DC

## 2024-01-25 NOTE — Telephone Encounter (Signed)
 Spoke with daughter Macky Sayres per [place]. Advised per Dr. Krasowski to take Xarelto  15mg  1 tablet twice daily for 21 days then begin Xarelto  20mg  daily. CBC in 1 week. Daughter verbalized understanding and had no further questions.

## 2024-01-25 NOTE — Addendum Note (Signed)
 Addended by: Shawnee Dellen D on: 01/25/2024 01:03 PM   Modules accepted: Orders

## 2024-01-31 NOTE — Progress Notes (Unsigned)
  Electrophysiology Office Note:   Date:  02/01/2024  ID:  Louis Barton, DOB December 20, 1940, MRN [id number]  Primary Cardiologist: Ralene Burger, MD Primary Heart Failure: None Electrophysiologist: Delight Bickle Cortland Ding, MD      History of Present Illness:   Louis Barton is a 83 y.o. 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   Since last being seen in our clinic the patient reports increased fatigue, weakness, shortness of breath.  He falls asleep quite easily.  Based on device interrogation, he has been in atrial fibrillation for many months.  After cardioversion he went back into atrial fibrillation.  He says he does not feel quite right, but cannot otherwise describe his symptoms.  There are days that he feels well.  he denies chest pain, palpitations, dyspnea, PND, orthopnea, nausea, vomiting, dizziness, syncope, edema, weight gain, or early satiety.   Review of systems complete and found to be negative unless listed in HPI.      EP Information / Studies Reviewed:    EKG is not ordered today. EKG from 12/21/23 reviewed which showed AF, V paced      PPM Interrogation-  reviewed in detail today,  See PACEART report.  Device History: Medtronic Dual Chamber PPM implanted 07/28/2021 for Second Degree AV block  Risk Assessment/Calculations:    CHA2DS2-VASc Score = 5   This indicates a 7.2% annual risk of stroke. The patient's score is based upon: CHF History: 1 HTN History: 1 Diabetes History: 0 Stroke History: 0 Vascular Disease History: 1 Age Score: 2 Gender Score: 0            Physical Exam:   VS:  BP 126/64 (BP Location: Right Arm, Patient Position: Sitting, Cuff Size: Normal)   Pulse 68   Ht 5\' 8"  (1.727 m)   Wt 155 lb (70.3 kg)   SpO2 97%   BMI 23.57 kg/m    Wt Readings from Last 3  Encounters:  02/01/24 155 lb (70.3 kg)  01/22/24 159 lb (72.1 kg)  01/01/24 159 lb 12.8 oz (72.5 kg)     GEN: Well nourished, well developed in no acute distress NECK: No JVD; No carotid bruits CARDIAC: Irregularly irregular rate and rhythm, no murmurs, rubs, gallops RESPIRATORY:  Clear to auscultation without rales, wheezing or rhonchi  ABDOMEN: Soft, non-tender, non-distended EXTREMITIES:  No edema; No deformity   ASSESSMENT AND PLAN:    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  Atrial fibrillation/typical atrial flutter: Post cardioversion 11/23/2023.  He is unfortunately back in atrial fibrillation feeling quite poor.  He would benefit from rhythm control.  We discussed medication options such as Tikosyn or amiodarone .  He would like to avoid admission to the hospital for dofetilide load.  Due to that, we Yasheka Fossett load with amiodarone  and plan for cardioversion.  3.  Chronic diastolic heart failure: Has lower extremity swelling.  Jensen Cheramie reassess post cardioversion  4.  Secondary hypercoagulable state: On Xarelto  for atrial fibrillation/flutter  Disposition:   Follow up with Afib Clinic in 4 weeks  Signed, Richards Pherigo Cortland Ding, MD

## 2024-02-01 ENCOUNTER — Ambulatory Visit: Attending: Cardiology | Admitting: Cardiology

## 2024-02-01 ENCOUNTER — Telehealth: Payer: Self-pay

## 2024-02-01 ENCOUNTER — Encounter: Payer: Self-pay | Admitting: Cardiology

## 2024-02-01 VITALS — BP 126/64 | HR 68 | Ht 68.0 in | Wt 155.0 lb

## 2024-02-01 DIAGNOSIS — I441 Atrioventricular block, second degree: Secondary | ICD-10-CM | POA: Diagnosis present

## 2024-02-01 DIAGNOSIS — I483 Typical atrial flutter: Secondary | ICD-10-CM | POA: Diagnosis not present

## 2024-02-01 DIAGNOSIS — D6869 Other thrombophilia: Secondary | ICD-10-CM | POA: Insufficient documentation

## 2024-02-01 DIAGNOSIS — I5032 Chronic diastolic (congestive) heart failure: Secondary | ICD-10-CM | POA: Insufficient documentation

## 2024-02-01 DIAGNOSIS — I4819 Other persistent atrial fibrillation: Secondary | ICD-10-CM | POA: Diagnosis not present

## 2024-02-01 LAB — CUP PACEART INCLINIC DEVICE CHECK
Date Time Interrogation Session: 20250508145812
Implantable Lead Connection Status: 753985
Implantable Lead Connection Status: 753985
Implantable Lead Implant Date: 20221102
Implantable Lead Implant Date: 20221121
Implantable Lead Location: 753859
Implantable Lead Location: 753860
Implantable Lead Model: 3830
Implantable Lead Model: 5076
Implantable Pulse Generator Implant Date: 20221102

## 2024-02-01 MED ORDER — AMIODARONE HCL 200 MG PO TABS: ORAL_TABLET | ORAL | 3 refills | Status: DC

## 2024-02-01 NOTE — Telephone Encounter (Signed)
 Pt is already on Xarelto  20mg  daily and follows with Dr. Almer Jacobson Hematology/Oncology

## 2024-02-01 NOTE — Telephone Encounter (Signed)
 Called and lft msg w AFIB Clinic for a 4w Post cath w clinic   02-01-24 VB

## 2024-02-01 NOTE — Patient Instructions (Signed)
 Medication Instructions:  Your physician has recommended you make the following change in your medication: START Amiodarone   - take 2 tablets (400 mg total) TWICE a day for 2 weeks, then  - take 1 tablet (200 mg total) TWICE a day for 2 weeks, then  - take 1 tablet (200 mg total) ONCE a day  *If you need a refill on your cardiac medications before your next appointment, please call your pharmacy*  Lab Work: None ordered  If you have any lab test that is abnormal or we need to change your treatment, we will call you to review the results.  Testing/Procedures: Your physician has recommended that you have a Cardioversion (DCCV). Electrical Cardioversion uses a jolt of electricity to your heart either through paddles or wired patches attached to your chest. This is a controlled, usually prescheduled, procedure. Defibrillation is done under light anesthesia in the hospital, and you usually go home the day of the procedure. This is done to get your heart back into a normal rhythm. You are not awake for the procedure. Please see the instruction sheet given to you today.   Follow-Up: At [facility], you and your health needs are our priority.  As part of our continuing mission to provide you with exceptional heart care, our providers are all part of one team.  This team includes your primary Cardiologist (physician) and Advanced Practice Providers or APPs (Physician Assistants and Nurse Practitioners) who all work together to provide you with the care you need, when you need it.  Remote monitoring is used to monitor your Pacemaker or ICD from home. This monitoring reduces the number of office visits required to check your device to one time per year. It allows us  to keep an eye on the functioning of your device to ensure it is working properly. You are scheduled for a device check from home on 04/25/2024. You may send your transmission at any time that day. If you have a wireless device, the  transmission will be sent automatically. After your physician reviews your transmission, you will receive a postcard with your next transmission date.   Your next appointment:   4 week(s) after your cardioversion  Provider:   Afib clinic  Thank you for choosing [facility]!!   Reece Cane, RN [phone]   Other Instructions     Dear Louis Barton  You are scheduled for a Cardioversion on Thursday, May 22 with Dr. Maximo Spar.  Please arrive at the [facility] (Main Entrance A) at [facility]: [address] [place], [place] [address] at 7:30 AM (This time is 1 hour(s) before your procedure to ensure your preparation).   Free valet parking service is available. You will check in at ADMITTING.   *Please Note: You will receive a call the day before your procedure to confirm the appointment time. That time may have changed from the original time based on the schedule for that day.*   DIET:  Nothing to eat or drink after midnight except a sip of water with medications (see medication instructions below)  MEDICATION INSTRUCTIONS: !!IF ANY NEW MEDICATIONS ARE STARTED AFTER TODAY, PLEASE NOTIFY YOUR PROVIDER AS SOON AS POSSIBLE!!  FYI: Medications such as Semaglutide (Ozempic, [place]), Tirzepatide (Mounjaro, Zepbound), Dulaglutide (Trulicity), etc ("GLP1 agonists") AND Canagliflozin (Invokana), Dapagliflozin (Farxiga), Empagliflozin (Jardiance), Ertugliflozin (Steglatro), Bexagliflozin [employer]) or any combination with one of these drugs such as Invokamet (Canagliflozin/Metformin), Synjardy (Empagliflozin/Metformin), etc ("SGLT2 inhibitors") must be held around the time of a  procedure. This is not a comprehensive list of all of these drugs. Please review all of your medications and talk to your provider if you take any one of these. If you are not sure, ask your provider.   Continue taking your anticoagulant (blood thinner): Rivaroxaban  (Xarelto ).  You will need to continue  this after your procedure until you are told by your provider that it is safe to stop.    LABS: CBC today  FYI:  For your safety, and to allow us  to monitor your vital signs accurately during the surgery/procedure we request: If you have artificial nails, gel coating, SNS etc, please have those removed prior to your surgery/procedure. Not having the nail coverings /polish removed may result in cancellation or delay of your surgery/procedure.  Your support person will be asked to wait in the waiting room during your procedure.  It is OK to have someone drop you off and come back when you are ready to be discharged.  You cannot drive after the procedure and will need someone to drive you home.  Bring your insurance cards.  *Special Note: Every effort is made to have your procedure done on time. Occasionally there are emergencies that occur at the hospital that may cause delays. Please be patient if a delay does occur.

## 2024-02-02 LAB — CBC
Hematocrit: 47.1 % (ref 37.5–51.0)
Hemoglobin: 15.8 g/dL (ref 13.0–17.7)
MCH: 31.5 pg (ref 26.6–33.0)
MCHC: 33.5 g/dL (ref 31.5–35.7)
MCV: 94 fL (ref 79–97)
Platelets: 141 10*3/uL — ABNORMAL LOW (ref 150–450)
RBC: 5.01 x10E6/uL (ref 4.14–5.80)
RDW: 13.3 % (ref 11.6–15.4)
WBC: 7.6 10*3/uL (ref 3.4–10.8)

## 2024-02-03 ENCOUNTER — Encounter: Payer: Self-pay | Admitting: Cardiology

## 2024-02-06 ENCOUNTER — Ambulatory Visit: Payer: BLUE CROSS/BLUE SHIELD | Admitting: Neurology

## 2024-02-06 ENCOUNTER — Encounter: Payer: Self-pay | Admitting: Neurology

## 2024-02-06 VITALS — BP 153/94 | HR 67 | Ht 68.0 in | Wt 160.0 lb

## 2024-02-06 DIAGNOSIS — G3184 Mild cognitive impairment, so stated: Secondary | ICD-10-CM | POA: Diagnosis not present

## 2024-02-06 DIAGNOSIS — R269 Unspecified abnormalities of gait and mobility: Secondary | ICD-10-CM | POA: Diagnosis not present

## 2024-02-06 NOTE — Progress Notes (Unsigned)
 Chief Complaint  Patient presents with   New Patient (Initial Visit)    Pt in 14 with daughter Pt here for gait and memory loss Pt states short term memory is worse  Daughter states 4 falls in last six months Daughter states last hospital visit 08/2023 Pt has blood clots in left arm and jugular       ASSESSMENT AND PLAN  Louis Barton is a 83 y.o. male   Dementia Gait abnormality  [facility] examination 23/30  He has hyperreflexia, bilateral Babinski signs, cautious mildly stiff gait,    Pending MRI of the brain and cervical spine on Feb 09, 2024  Referred him to physical therapy  Return To Clinic With NP In 6 Months may consider  amyloid PET scan, Alzheimer's specific laboratory evaluations  DIAGNOSTIC DATA (LABS, IMAGING, TESTING) - I reviewed patient records, labs, notes, testing and imaging myself where available.   MEDICAL HISTORY:  Louis Barton, is a 83 year old male, accompanied by his daughter seen in request by his primary care from [facility] [address]. Eluterio Hamburg, Erla Haw for evaluation of unsteady gait, initial evaluation was on August 17, 2023  History is obtained from the patient and review of electronic medical records. I personally reviewed pertinent available imaging films in PACS.   PMHx of  Hx of Vit B12 deficiency. 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 Lung Cancer, carcinoma, right lower lobectomy  He still work as a Water engineer, spends a lot of time in front of the computer, just recently began to go to the gym, walking more regularly, average step [phone] daily  He underwent a lot of stress, lost his wife in April 2023, lives alone  Since he suffered COVID end of 2022, he noticed mild unsteady gait, gradually getting worse, tendency to lean forward, his gait improves with faster pace,  He also complains of mild neck pain, limited range of motion, worsening  urinary urgency he contributed to Lasix  use   UPDATE Feb 06 2024 Hosptial amsiion in Dec, fell with rib fracute, now he had 3 headachs over right eye, last night, leaning toward left, blood in urine in the past, not enough for abx,    He came off Lasxi  Left arm swell, 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  Wife apssd away 2 years ago, he is dateing to the ner pwerson in his life, the dream involve her,  it is about her past life, what is being to him, why it is howing her as bad person,   In his early life, sexual is mportant for him, caused him relastedship, to take on sexual happiness, really kind of thingsk cause senstive,   He told her for about 5 months, if he slep with her, he will not have   He lie there, they doe not sleep together, he hears hainvg activies, in and out of house, now outside his   He lives in her basement, she is 68, veyr nice lady, very good christian lady, he make her look like slug,  bacuase the experience in the past, dysfunction,  elrectile functions,     PHYSICAL EXAM:  Vitals:   02/06/24 1120  BP: (!) 153/94  Pulse: 67  Weight: 160 lb (72.6 kg)  Height: 5\' 8"  (1.727 m)    Body mass index is 24.33 kg/m.  PHYSICAL EXAMNIATION:  Gen: NAD, conversant, well nourised, well groomed                     Cardiovascular: Regular rate rhythm, no peripheral edema, warm, nontender. Eyes: Conjunctivae clear without exudates or hemorrhage Neck:  no carotid bruits.  Mild limited range of motion of neck turning Pulmonary: Clear to auscultation bilaterally   NEUROLOGICAL EXAM:  MENTAL STATUS: Speech/cognition:     02/06/2024   11:21 AM  MMSE - Mini Mental State Exam  Orientation to time 5  Orientation to  Place 4  Registration 3  Attention/ Calculation 2  Recall 2  Language- name 2 objects 2  Language- repeat 0  Language- follow 3 step command 3  Language- read & follow direction 1  Write a sentence 1  Copy design 0  Total score 23    CRANIAL NERVES: CN II: Visual fields are full to confrontation.  Small pupils  reactive to light. CN III, IV, VI: extraocular movement are normal. No ptosis. CN V: Facial sensation is intact to light touch CN VII: Face is symmetric with normal eye closure  CN VIII: Hearing is normal to causal conversation. CN IX, X: Phonation is normal. CN XI: Head turning and shoulder shrug are intact  MOTOR: There is no pronator drift of out-stretched arms. Muscle bulk and tone are normal. Muscle strength is normal.  REFLEXES: Reflexes are 2+ and symmetric at the biceps, triceps, 3/3 knees, and trace at ankles. Plantar responses are extensor bilaterally  SENSORY: Mildly length-dependent decreased pinprick to ankle level, decreased vibratory sensation in distal leg  COORDINATION: There is no trunk or limb dysmetria noted.  GAIT/STANCE: Able to get up from seated position arm crossed, mildly unsteady, stiff,    REVIEW OF SYSTEMS:  Full 14 system review of systems performed and notable only for as above All other review of systems were negative.   ALLERGIES: No Known Allergies  HOME MEDICATIONS: Current Outpatient Medications  Medication Sig Dispense Refill   colesevelam  (WELCHOL ) 625 MG tablet Take 1,250 mg by mouth 2 (two) times daily with a meal.     furosemide  (LASIX ) 20 MG tablet Take 1 tablet (20 mg total) by mouth daily as needed. (Patient taking differently: Take 20 mg by mouth as needed.)     naftifine  (NAFTIN ) 1 % cream Apply topically daily. 60 g 0   potassium chloride  (KLOR-CON  M) 10 MEQ tablet TAKE 1 TABLET (10 MEQ TOTAL) BY MOUTH DAILY AS NEEDED (TAKE ON DAYS LASIX  IS TAKEN). (Patient taking differently: Take 10 mEq by mouth as needed (take on  days lasix  is taken).) 90 tablet 1   Rivaroxaban  (XARELTO ) 15 MG TABS tablet Take 1 tablet (15 mg total) by mouth 2 (two) times daily with a meal. For 21 days 42 tablet 0   rivaroxaban  (XARELTO ) 20 MG TABS tablet Take 1 tablet (20 mg total) by mouth daily with supper. 90 tablet 3   rosuvastatin  (CRESTOR ) 40 MG tablet Take 1 tablet (40 mg total) by mouth at bedtime.     metoprolol  succinate (TOPROL -XL) 25 MG 24 hr tablet TAKE 0.5 TABLETS BY MOUTH AT BEDTIME. (Patient not taking: Reported on 02/01/2024) 45 tablet 1   No current facility-administered medications for this visit.    PAST MEDICAL HISTORY: Past  Medical History:  Diagnosis Date   Abnormal stress test    Acute encephalopathy 05/05/2021   Aftercare following surgery of the circulatory system, NEC 12/19/2013   Arteriosclerosis of coronary artery 11/16/2015   Formatting of this note might be different from the original.  Formatting of this note might be different from the original.  Formatting of this note might be different from the original.  On imaging;  Formatting of this note might be different from the original.  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  02/2021  Formatting of this note might be different from the original.  Formatting of this note might be different from the original.  02/2021     Chronic kidney disease    cysts on both kidneys, seeing Starleen Eastern in [place], [facility]     Coronary arteriosclerosis 11/16/2015   Formatting of this note might be different from the original. On imaging;   COVID 05/05/2021   COVID-19 virus infection 05/05/2021   Cramps of left lower extremity-Calf  > right calf 01/01/2015   Cyst, meninges, spinal 11/16/2015   Degeneration of lumbar intervertebral disc 11/16/2015   Formatting of this note might be different from the original. signficant DDD present with vacuum effect L4-5 and L5-S1 flat discs and anterior spurring noted   Dizziness 05/06/2020   Dyslipidemia 03/18/2015   Dyspnea on exertion 04/02/2020   Ear itch 05/14/2018   Edema 11/16/2015   Edema of both lower extremities due to peripheral venous insufficiency 02/03/2020   Elevated tumor markers 12/15/2017   Erectile dysfunction 11/16/2015   First degree AV block 10/20/2020   Gait abnormality 04/19/2022   Heart block 05/05/2021   Heart failure (HCC)    History of hiatal hernia    seen on last scan- as slight    History of thrombocytopenia    History of TIAs    Hyperlipidemia    Hypertension    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 [place]. And Dr. Debbie Fails of this note might be different from the original.  Formatting of this note might be different from the original.  Stage 1A. [place]. And Dr. Debbie Fails of this note might be different from the original.  Stage 1A. [place]. And Dr. Waymond Hailey Adenocarcinoma (non-small cell). Observation.     Malignant neoplasm of left lung (HCC) 12/08/2017   Stage 1A. [place]. And Dr. 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  Formatting of this note might be different from the original.  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 These slowly enlarging 9 mm right lower lobe pulmonary nodule along the right hemidiaphragm does not appear hypermetabolic today. Location adjacent to the hemidiaphragm can cause false negatives due to motion artifact related to the   Sprain of left ankle 05/21/2021   Stage 3a chronic kidney disease (HCC) 06/17/2020   Stroke (HCC)    Swelling of limb-Left foot 01/01/2015   Symptomatic bradycardia 03/18/2015   Thrombocytopenia (HCC) 11/16/2015   Typical atrial flutter (HCC) 03/19/2021   Vitamin B12 deficiency 06/21/2017   Weakness of left lower extremity 09/21/2023    PAST SURGICAL HISTORY: Past Surgical History:  Procedure Laterality Date   adenocarcinoma  2019   Removed   CARDIOVERSION N/A 03/22/2021   Procedure:  CARDIOVERSION;  Surgeon: Lenise Quince, MD;  Location[facility] ENDOSCOPY;  Service: Cardiovascular;  Laterality: N/A;   CARDIOVERSION N/A 12/15/2021   Procedure: CARDIOVERSION;  Surgeon: Bridgette Campus, MD;  Location: [facility] ENDOSCOPY;  Service: Cardiovascular;  Laterality: N/A;   CARDIOVERSION N/A 11/23/2023   Procedure: CARDIOVERSION;  Surgeon: Hugh Madura, MD;  Location: [facility] INVASIVE CV LAB;  Service: Cardiovascular;  Laterality: N/A;   CAROTID ENDARTERECTOMY  2005   Left carotid by Dr. Nolene Baumgarten   CAROTID ENDARTERECTOMY  04/25/11   Right Carotid by Dr. Nolene Baumgarten   COLONOSCOPY  02/23/2017   Colonic polyp status post polypectomy. Pancolonic diverticulosis predominatly in the sigmoid colon. Tubular adenoma   LEAD REVISION/REPAIR N/A 08/16/2021   Procedure: LEAD REVISION/REPAIR;  Surgeon: Lei Pump, MD;  Location: [facility] INVASIVE CV LAB;  Service: Cardiovascular;  Laterality: N/A;   LEFT HEART CATH AND CORONARY ANGIOGRAPHY N/A 02/08/2021   Procedure: LEFT HEART CATH AND CORONARY ANGIOGRAPHY;  Surgeon: Odie Benne, MD;  Location: [facility] INVASIVE CV LAB;  Service: Cardiovascular;  Laterality: N/A;   PACEMAKER IMPLANT N/A 07/28/2021   Procedure: PACEMAKER IMPLANT;  Surgeon: Lei Pump, MD;  Location: [facility] INVASIVE CV LAB;  Service: Cardiovascular;  Laterality: N/A;   Septoplasty, nasal w/ submucosal resection  1960   SQUAMOUS CELL CARCINOMA EXCISION     pt said numerous times   TEE WITHOUT CARDIOVERSION N/A 03/22/2021   Procedure: TRANSESOPHAGEAL ECHOCARDIOGRAM (TEE);  Surgeon: Lenise Quince, MD;  Location: [facility] ENDOSCOPY;  Service: Cardiovascular;  Laterality:  N/A;   TONSILLECTOMY     VIDEO ASSISTED THORACOSCOPY (VATS)/WEDGE RESECTION Right 11/09/2017   Procedure: RIGHT VIDEO ASSISTED THORACOSCOPY WITH Gentry Kief RESECTION;  Surgeon: Zelphia Higashi, MD;  Location: [facility] OR;  Service: Thoracic;  Laterality: Right;    FAMILY HISTORY: Family History  Problem Relation Age of Onset    Cancer Mother 37       Breast   Stroke Mother    Hyperlipidemia Mother    Other Mother        varicose veins   Heart attack Mother    Heart disease Mother        Left ankle swelling   Hypertension Mother    Deep vein thrombosis Mother    Varicose Veins Mother    Heart attack Father    Other Sister        Tumor in Lung and Brain   Cancer Sister        Tumor   Lung  and  Brain   Cancer Brother        BCC-SCC-Merkle Cell   Memory loss Paternal Grandfather    Colon cancer Neg Hx    Esophageal cancer Neg Hx    Rectal cancer Neg Hx    Stomach cancer Neg Hx    Alzheimer's disease Neg Hx    Dementia Neg Hx     SOCIAL HISTORY: Social History   Socioeconomic History   Marital status: Widowed    Spouse name: Not on file   Number of children: 2   Years of education: Not on file   Highest education level: Not on file  Occupational History   Not on file  Tobacco Use   Smoking status: Former    Current packs/day: 0.00    Average packs/day: 1 pack/day for 16.0 years (16.0 ttl pk-yrs)    Types: Cigarettes    Start date: 09/26/1956    Quit date: 09/26/1972    Years since quitting: 51.3   Smokeless tobacco: Never   Tobacco comments:    Former smoker 08/11/21  Vaping Use   Vaping status: Never Used  Substance and Sexual Activity   Alcohol  use: Not Currently    Alcohol /week: 7.0 standard drinks of alcohol     Types: 7 Shots of liquor per week    Comment: 1 shot daily 08/11/21   Drug use: No   Sexual activity: Not Currently  Other Topics Concern   Not on file  Social History Narrative   Pt lives alone    Retired    Social Drivers of Corporate investment banker Strain: Not on file  Food Insecurity: No Food Insecurity (09/05/2023)   Hunger Vital Sign    Worried About Running Out of Food in the Last Year: Never true    Ran Out of Food in the Last Year: Never true  Transportation Needs: No Transportation Needs (09/05/2023)   PRAPARE - Scientist, research (physical sciences) (Medical): No    Lack of Transportation (Non-Medical): No  Physical Activity: Not on file  Stress: Not on file  Social Connections: Not on file  Intimate Partner Violence: Not At Risk (09/05/2023)   Humiliation, Afraid, Rape, and Kick questionnaire    Fear of Current or Ex-Partner: No    Emotionally Abused: No    Physically Abused: No    Sexually Abused: No      Phebe Brasil, M.D. Ph.D.  [facility] [address] [place], [place] [address] Ph: [phone] Fax: [phone]  [phone]  CC:  Abbe Hoard., MD [address] [place],  [place] [address]  Abbe Hoard., MD

## 2024-02-07 ENCOUNTER — Telehealth: Payer: Self-pay | Admitting: Cardiology

## 2024-02-07 NOTE — Telephone Encounter (Signed)
 Spoke with pt regarding Tikosyn. Pt is interested in starting the medication now. Pt was told this would be sent to Dr. Lawana Pray and his nurse for assistance. Pt verbalized understanding. All questions if any were answered.

## 2024-02-07 NOTE — Telephone Encounter (Signed)
   Pt would like to ask Dr. Lawana Pray if he can do the new test that requires to stay in the hospital for 3 days to prepare for cardioversion. He forgot the name of that test.

## 2024-02-08 ENCOUNTER — Telehealth: Payer: Self-pay

## 2024-02-08 NOTE — Telephone Encounter (Signed)
 Referral for PT sent to [facility] in [place]  [phone]

## 2024-02-09 ENCOUNTER — Ambulatory Visit (HOSPITAL_COMMUNITY)
Admission: RE | Admit: 2024-02-09 | Discharge: 2024-02-09 | Disposition: A | Discharge status: Home / Self Care | Source: Ambulatory Visit | Attending: Neurology | Admitting: Neurology

## 2024-02-09 DIAGNOSIS — R269 Unspecified abnormalities of gait and mobility: Secondary | ICD-10-CM | POA: Diagnosis present

## 2024-02-09 DIAGNOSIS — G3184 Mild cognitive impairment, so stated: Secondary | ICD-10-CM | POA: Insufficient documentation

## 2024-02-09 NOTE — Progress Notes (Signed)
  Device system confirmed to be MRI conditional, with implant date > 6 weeks ago, and no evidence of abandoned or epicardial leads in review of most recent CXR  Device last cleared by EP Provider: Creighton Doffing NP 01/31/2024  Clearance is good through for 1 year as long as parameters remain stable at time of check. If pt undergoes a cardiac device procedure during that time, they should be re-cleared.   Tachy-therapies to be programmed off if applicable with device back to pre-MRI settings after completion of exam.  [employer] - Programming recommendation received through [employer] App/Tablet  Burach, Bramson  02/09/2024 1:11 PM

## 2024-02-12 ENCOUNTER — Encounter: Payer: Self-pay | Admitting: Cardiology

## 2024-02-12 ENCOUNTER — Ambulatory Visit

## 2024-02-12 VITALS — BP 160/98 | HR 80 | Ht 68.0 in | Wt 160.0 lb

## 2024-02-12 DIAGNOSIS — I4819 Other persistent atrial fibrillation: Secondary | ICD-10-CM

## 2024-02-12 NOTE — Progress Notes (Signed)
   Nurse Visit   Date of Encounter: 02/12/2024 ID: Louis Barton, DOB 08/07/1941, MRN [id number]  PCP:  Abbe Hoard., MD   [facility] Providers Cardiologist:  Ralene Burger, MD Electrophysiologist:  Lei Pump, MD      Visit Details   VS:  BP (!) 160/98   Pulse 80   Ht 5\' 8"  (1.727 m)   Wt 160 lb (72.6 kg)   SpO2 95%   BMI 24.33 kg/m  , BMI Body mass index is 24.33 kg/m.  Wt Readings from Last 3 Encounters:  02/12/24 160 lb (72.6 kg)  02/06/24 160 lb (72.6 kg)  02/01/24 155 lb (70.3 kg)     Reason for visit: arm swelling Performed today: Vitals, Provider consulted:Madireddy, and Education Changes (medications, testing, etc.) : Advised to go to the ED for evaluation of left lower arm swelling. Pt has a strong left radial pulse and cap refill less than 2 secs. Pt had a squamous cell removed in same area. Wound is red and looks to have drainage. Pt does not have swelling above the elbow. Pt recently dx with bld clots. Pt agreed to plan. Length of Visit: 15 minutes    Medications Adjustments/Labs and Tests Ordered: No orders of the defined types were placed in this encounter.  No orders of the defined types were placed in this encounter.    Signed, Einar Grave, RN  02/12/2024 5:07 PM

## 2024-02-13 ENCOUNTER — Other Ambulatory Visit: Payer: Self-pay | Admitting: Cardiology

## 2024-02-13 MED ORDER — RIVAROXABAN 20 MG PO TABS: 20.0000 mg | ORAL_TABLET | Freq: Every day | ORAL | 5 refills | Status: AC

## 2024-02-13 NOTE — Progress Notes (Signed)
 Cardiology Office Note:    Date:  02/13/2024   ID:  Louis Barton, DOB [birth year], MRN [id number]  PCP:  Abbe Hoard., MD  Cardiologist:  Ralene Burger, MD    Referring MD: Abbe Hoard., MD   Chief Complaint  Patient presents with   Hands swelling    History of Present Illness:    Louis Barton is a 83 y.o. male   past medical history significant for coronary disease mild nonobstructive cardiac cath in May 2022 showing 20% mid to distal circumflex as well as 20% mid LAD, also high degree AV block required pacing, likely has been complaining of significant swelling of lower extremities.  He was given some extra Lasix  with partial relief he does not think it helped him at all.  He does have also some tinea on his lower extremities.  It bothers him a lot especially especially during the night.  Today however he came because of swelling on the right upper extremity.  There is not much pain but some swelling.  Otherwise he seems to be doing fine    Past Medical History:  Diagnosis Date   Abnormal stress test    Acute encephalopathy 05/05/2021   Aftercare following surgery of the circulatory system, NEC 12/19/2013   Arteriosclerosis of coronary artery 11/16/2015   Formatting of this note might be different from the original.  Formatting of this note might be different from the original.  Formatting of this note might be different from the original.  On imaging;  Formatting of this note might be different from the original.  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  02/2021  Formatting of this note might be different from the original.  Formatting of this note might be different from the original.  02/2021     Chronic kidney disease    cysts on both kidneys, seeing Starleen Eastern in [place], [facility]    Coronary arteriosclerosis 11/16/2015   Formatting of this note might be different from the original. On imaging;   COVID 05/05/2021   COVID-19 virus infection 05/05/2021   Cramps of left lower extremity-Calf  > right calf 01/01/2015   Cyst, meninges, spinal 11/16/2015   Degeneration of lumbar intervertebral disc 11/16/2015   Formatting of this note might be different from the original. signficant DDD present with vacuum effect L4-5 and L5-S1 flat discs and anterior spurring noted   Dizziness 05/06/2020   Dyslipidemia 03/18/2015   Dyspnea on exertion 04/02/2020   Ear itch 05/14/2018   Edema 11/16/2015   Edema of both lower extremities due to peripheral venous insufficiency 02/03/2020   Elevated tumor markers 12/15/2017   Erectile dysfunction 11/16/2015   First degree AV block 10/20/2020   Gait abnormality 04/19/2022   Heart block 05/05/2021   Heart failure (HCC)    History of hiatal hernia    seen on  last scan- as slight    History of thrombocytopenia    History of TIAs    Hyperlipidemia    Hypertension    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 [place]. And Dr. Debbie Fails of this note might be different from the original.  Formatting of this note might be different from the original.  Stage 1A. [place]. And Dr. Debbie Fails of this note might be different from the original.  Stage 1A. [place]. And Dr.  Waymond Hailey Adenocarcinoma (non-small cell). Observation.     Malignant neoplasm of left lung (HCC) 12/08/2017   Stage 1A. [place]. And Dr. 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  Formatting of this note might be different from the original.  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 These slowly enlarging 9 mm right lower lobe pulmonary nodule along the right hemidiaphragm does not appear hypermetabolic today. Location adjacent to the hemidiaphragm can cause false negatives due to motion artifact related to the   Sprain of left ankle 05/21/2021   Stage 3a chronic kidney disease (HCC) 06/17/2020   Stroke (HCC)    Swelling of limb-Left foot  01/01/2015   Symptomatic bradycardia 03/18/2015   Thrombocytopenia (HCC) 11/16/2015   Typical atrial flutter (HCC) 03/19/2021   Vitamin B12 deficiency 06/21/2017   Weakness of left lower extremity 09/21/2023    Past Surgical History:  Procedure Laterality Date   adenocarcinoma  2019   Removed   CARDIOVERSION N/A 03/22/2021   Procedure: CARDIOVERSION;  Surgeon: Lenise Quince, MD;  Location: [facility] ENDOSCOPY;  Service: Cardiovascular;  Laterality: N/A;   CARDIOVERSION N/A 12/15/2021   Procedure: CARDIOVERSION;  Surgeon: Bridgette Campus, MD;  Location: [facility] ENDOSCOPY;  Service: Cardiovascular;  Laterality: N/A;   CARDIOVERSION N/A 11/23/2023   Procedure: CARDIOVERSION;  Surgeon: Hugh Madura, MD;  Location: [facility] INVASIVE CV LAB;  Service: Cardiovascular;  Laterality: N/A;   CAROTID ENDARTERECTOMY  2005   Left carotid by Dr. Nolene Baumgarten   CAROTID ENDARTERECTOMY  04/25/11   Right Carotid by Dr. Nolene Baumgarten   COLONOSCOPY  02/23/2017   Colonic polyp status post polypectomy. Pancolonic diverticulosis predominatly in the sigmoid colon. Tubular adenoma   LEAD REVISION/REPAIR N/A 08/16/2021   Procedure: LEAD REVISION/REPAIR;  Surgeon: Lei Pump, MD;  Location: [facility] INVASIVE CV LAB;  Service: Cardiovascular;  Laterality: N/A;   LEFT HEART CATH AND CORONARY ANGIOGRAPHY N/A 02/08/2021   Procedure: LEFT HEART CATH AND CORONARY ANGIOGRAPHY;  Surgeon: Odie Benne, MD;  Location: [facility] INVASIVE CV LAB;  Service: Cardiovascular;  Laterality: N/A;   PACEMAKER IMPLANT N/A 07/28/2021   Procedure: PACEMAKER IMPLANT;  Surgeon: Lei Pump, MD;  Location: [facility] INVASIVE CV LAB;  Service: Cardiovascular;  Laterality: N/A;   Septoplasty, nasal w/ submucosal resection  1960   SQUAMOUS CELL CARCINOMA EXCISION     pt said numerous times   TEE WITHOUT CARDIOVERSION N/A 03/22/2021   Procedure: TRANSESOPHAGEAL ECHOCARDIOGRAM (TEE);  Surgeon: Lenise Quince, MD;  Location: [facility] ENDOSCOPY;  Service:  Cardiovascular;  Laterality: N/A;   TONSILLECTOMY     VIDEO ASSISTED THORACOSCOPY (VATS)/WEDGE RESECTION Right 11/09/2017   Procedure: RIGHT VIDEO ASSISTED THORACOSCOPY WITH Gentry Kief RESECTION;  Surgeon: Zelphia Higashi, MD;  Location: [facility] OR;  Service: Thoracic;  Laterality: Right;    Current Medications: Current Meds  Medication Sig   colesevelam  (WELCHOL ) 625 MG tablet Take 1,250 mg by mouth 2 (two) times daily with a meal.   furosemide  (LASIX ) 20 MG tablet Take 1 tablet (20 mg total) by mouth daily as needed. (Patient taking differently: Take 20 mg by mouth daily as needed for fluid or edema.)   naftifine  (NAFTIN ) 1 % cream Apply topically daily. (Patient taking differently: Apply 1 Application topically daily as needed (foot).)   potassium chloride  (KLOR-CON  M) 10 MEQ tablet TAKE 1 TABLET (10 MEQ TOTAL) BY MOUTH DAILY AS NEEDED (TAKE ON DAYS LASIX  IS TAKEN).   rosuvastatin  (CRESTOR ) 40 MG tablet Take 1 tablet (40 mg total) by mouth at bedtime.   [DISCONTINUED] Rivaroxaban  (XARELTO ) 15 MG TABS tablet Take 1 tablet (15 mg total) by mouth daily with supper.     Allergies:   Patient has no known allergies.   Social History   Socioeconomic History   Marital status: Widowed    Spouse name: Not on file   Number of children: 2   Years of education: Not on file   Highest education level: Not on file  Occupational History   Not on file  Tobacco Use   Smoking status: Former    Current packs/day: 0.00    Average packs/day: 1 pack/day for 16.0 years (16.0 ttl pk-yrs)    Types: Cigarettes    Start date: 09/26/1956    Quit date: 09/26/1972    Years since quitting: 51.4   Smokeless tobacco: Never   Tobacco comments:    Former smoker 08/11/21  Vaping Use   Vaping status: Never Used  Substance and Sexual Activity   Alcohol  use: Not Currently    Alcohol /week: 7.0 standard drinks of alcohol     Types: 7 Shots of liquor per week    Comment: 1 shot daily 08/11/21   Drug use: No   Sexual  activity: Not Currently  Other Topics Concern   Not on file  Social History Narrative   Pt lives alone    Retired    Social Drivers of Corporate investment banker Strain: Not on  file  Food Insecurity: No Food Insecurity (09/05/2023)   Hunger Vital Sign    Worried About Running Out of Food in the Last Year: Never true    Ran Out of Food in the Last Year: Never true  Transportation Needs: No Transportation Needs (09/05/2023)   PRAPARE - Administrator, Civil Service (Medical): No    Lack of Transportation (Non-Medical): No  Physical Activity: Not on file  Stress: Not on file  Social Connections: Not on file     Family History: The patient's family history includes Cancer in his brother and sister; Cancer (age of onset: 75) in his mother; Deep vein thrombosis in his mother; Heart attack in his father and mother; Heart disease in his mother; Hyperlipidemia in his mother; Hypertension in his mother; Memory loss in his paternal grandfather; Other in his mother and sister; Stroke in his mother; Varicose Veins in his mother. There is no history of Colon cancer, Esophageal cancer, Rectal cancer, Stomach cancer, Alzheimer's disease, or Dementia. ROS:   Please see the history of present illness.    All 14 point review of systems negative except as described per history of present illness  EKGs/Labs/Other Studies Reviewed:         Recent Labs: 09/04/2023: Magnesium 1.9 12/20/2023: ALT 16; NT-Pro BNP 2,585; TSH 2.720 01/01/2024: BUN 17; Creatinine, Ser 1.01; Potassium 3.8; Sodium 141 02/01/2024: Hemoglobin 15.8; Platelets 141  Recent Lipid Panel    Component Value Date/Time   CHOL 159 11/21/2018 0836   TRIG 82 11/21/2018 0836   HDL 59 11/21/2018 0836   CHOLHDL 2.7 11/21/2018 0836   LDLCALC 84 11/21/2018 0836    Physical Exam:    VS:  BP (!) 140/90 (BP Location: Right Arm, Patient Position: Sitting)   Pulse 75   Ht 5\' 8"  (1.727 m)   Wt 159 lb (72.1 kg)   SpO2 95%   BMI  24.18 kg/m     Wt Readings from Last 3 Encounters:  02/12/24 160 lb (72.6 kg)  02/06/24 160 lb (72.6 kg)  02/01/24 155 lb (70.3 kg)     GEN:  Well nourished, well developed in no acute distress HEENT: Normal NECK: No JVD; No carotid bruits LYMPHATICS: No lymphadenopathy CARDIAC: RRR, no murmurs, no rubs, no gallops RESPIRATORY:  Clear to auscultation without rales, wheezing or rhonchi  ABDOMEN: Soft, non-tender, non-distended MUSCULOSKELETAL:  No edema; No deformity  SKIN: Warm and dry LOWER EXTREMITIES: Mild swelling of EXTR upper extremity left arm, pulse present good capillary refill NEUROLOGIC:  Alert and oriented x 3 PSYCHIATRIC:  Normal affect   ASSESSMENT:    1. Left arm pain   2. Left arm swelling   3. Coronary artery disease involving native coronary artery of native heart without angina pectoris   4. PAD (peripheral artery disease) (HCC)   5. Hypercoagulable state due to persistent atrial fibrillation (HCC)    PLAN:    In order of problems listed above:  Swelling of left arm.  Will schedule him to have venous ultrasound to rule out any significant for DVT. Coronary disease stable from that point review. Peripheral vascular disease no new problems continue present management. Persistent atrial fibrillation continue anticoagulation   Medication Adjustments/Labs and Tests Ordered: Current medicines are reviewed at length with the patient today.  Concerns regarding medicines are outlined above.  Orders Placed This Encounter  Procedures   VAS US  UPPER EXTREMITY VENOUS DUPLEX   Medication changes: No orders of the defined types were placed  in this encounter.   Signed, Manfred Seed, MD, [facility] 02/13/2024 12:11 PM    [facility]

## 2024-02-13 NOTE — Telephone Encounter (Signed)
 Pt last saw Dr Lawana Pray 02/01/24, last labs 01/01/24 Creat 1.01, age 83, weight 72.6kg, CrCl 54.9, based on CrCl pt should be on Xarelto  20mg  every day for afib/aflutter.   Per telephone note on 01/24/24:  Advised per Dr. Krasowski to take Xarelto  15mg  1 tablet twice daily for 21 days then begin Xarelto  20mg  daily. CBC in 1 week. Daughter verbalized understanding and had no further questions.    Will send in rx for Xarelto  20mg  every day.

## 2024-02-15 ENCOUNTER — Ambulatory Visit (HOSPITAL_COMMUNITY): Admission: RE | Admit: 2024-02-15 | Source: Home / Self Care | Admitting: Internal Medicine

## 2024-02-15 ENCOUNTER — Encounter (HOSPITAL_COMMUNITY): Admission: RE | Payer: Self-pay | Source: Home / Self Care

## 2024-02-15 SURGERY — CARDIOVERSION (CATH LAB)
Anesthesia: General

## 2024-02-23 ENCOUNTER — Encounter: Payer: Self-pay | Admitting: Podiatry

## 2024-02-26 ENCOUNTER — Encounter (HOSPITAL_COMMUNITY): Payer: Self-pay | Admitting: *Deleted

## 2024-02-26 ENCOUNTER — Encounter (HOSPITAL_COMMUNITY): Payer: Self-pay | Admitting: Internal Medicine

## 2024-02-26 ENCOUNTER — Ambulatory Visit (HOSPITAL_COMMUNITY)
Admission: RE | Admit: 2024-02-26 | Discharge: 2024-02-26 | Disposition: A | Discharge status: Home / Self Care | Source: Ambulatory Visit | Attending: Internal Medicine | Admitting: Internal Medicine

## 2024-02-26 ENCOUNTER — Telehealth: Payer: Self-pay | Admitting: Neurology

## 2024-02-26 VITALS — BP 144/100 | HR 63 | Ht 68.0 in | Wt 159.4 lb

## 2024-02-26 DIAGNOSIS — Z79899 Other long term (current) drug therapy: Secondary | ICD-10-CM

## 2024-02-26 DIAGNOSIS — Z5181 Encounter for therapeutic drug level monitoring: Secondary | ICD-10-CM | POA: Diagnosis present

## 2024-02-26 DIAGNOSIS — D6869 Other thrombophilia: Secondary | ICD-10-CM

## 2024-02-26 DIAGNOSIS — I4819 Other persistent atrial fibrillation: Secondary | ICD-10-CM | POA: Diagnosis not present

## 2024-02-26 NOTE — Telephone Encounter (Signed)
 Please call patient, MRI of the brain showed no acute abnormality, there was moderate generalized atrophy and small vessel disease,there was also evidence of right frontal remote hemorrhage  MRI of the cervical spine showed multilevel degenerative changes, most noticeable at C3-4 C4-5 C5-6 level, with mild canal stenosis but there was no spinal cord signal abnormality       IMPRESSION:   Moderate diffuse cerebral volume loss with moderate chronic microvascular ischemic changes.   Superficial siderosis in the right frontal region suggestive of remote hemorrhage.   IMPRESSION: 1. No acute findings or clear explanation for the patient's symptoms. 2. 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 There is mild cord flattening at C3-4, C4-5 and C5-6 without abnormal cord signal. 3. Diminutive left vertebral artery without definite flow void, possibly occluded.

## 2024-02-26 NOTE — Progress Notes (Signed)
 Primary Care Physician: Abbe Hoard., MD Primary Cardiologist: Dr Gordan Latina  Primary Electrophysiologist: Dr Lawana Pray Referring Physician: Dr Cameron Cea is a 83 y.o. 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 Patient underwent PPM implant on 07/28/21 with Dr Lawana Pray. His device interrogation at his wound check showed 36% atrial flutter burden. He has know atrial flutter and has been on Eliquis  for a CHADS2VASC score of 6. He is unaware of his afib today. He does have some fatigue and dyspnea with exertion but this predates the onset of his afib. Patient admits to reaching overhead after PPM implant just prior to onset of continuous hiccups. Dr. Lawana Pray made aware and patient is pending lead revision.  On follow up 12/21/23, he is currently in Afib. He is s/p successful DCCV on 11/23/23. He notes he went back into Afib about 10 days after cardioversion. Friend and patient do not specifically notice he had any improvement post cardioversion. Patient does tell me he felt much worse historically when he was taking amiodarone  (he was dizzy more frequently). He does have intermittent dizziness but this sounds per patient to be chronic and not necessarily tied to Afib. No missed doses of Xarelto  15 mg daily.   On follow up 02/26/24, he is currently in V paced rhythm (atrial flutter). Last seen by Dr. Lawana Pray on 02/01/24 and decided to proceed with rhythm control and load amiodarone . Messages in system show apparent confusion with patient noting to wish to switch to Tikosyn. A cardioversion was scheduled for 5/22 but subsequently canceled due to blood clots being found in left arm and jugular vein. He stopped amiodarone  on 5/19. He notes intermittent fatigue and dizziness. He states he stopped the  amiodarone  because it "wiped him out" and made him feel very tired. No missed doses of Xarelto  20 mg daily.   Today, he denies symptoms of palpitations, chest pain, orthopnea, PND, lower extremity edema, dizziness, presyncope, syncope, snoring, daytime somnolence, bleeding, or neurologic sequela. The patient is tolerating medications without difficulties and is otherwise without complaint today.    Atrial Fibrillation Risk Factors:  he does not have symptoms or diagnosis of sleep apnea. he does not have a history of rheumatic fever. he does have a history of alcohol  use.   he has a BMI of Body mass index is 24.24 kg/m.Aaron Aas Filed Weights   02/26/24 1103  Weight: 72.3 kg      Family History  Problem Relation Age of Onset   Cancer Mother 33       Breast   Stroke Mother    Hyperlipidemia Mother    Other Mother        varicose veins   Heart attack Mother    Heart disease Mother        Left ankle swelling   Hypertension Mother    Deep vein thrombosis Mother    Varicose Veins Mother    Heart attack Father    Other Sister        Tumor in Lung and Brain   Cancer Sister        Tumor   Lung  and  Brain   Cancer Brother        BCC-SCC-Merkle Cell   Memory loss Paternal Grandfather    Colon cancer Neg  Hx    Esophageal cancer Neg Hx    Rectal cancer Neg Hx    Stomach cancer Neg Hx    Alzheimer's disease Neg Hx    Dementia Neg Hx     Atrial Fibrillation Management history:  Previous antiarrhythmic drugs: none Previous cardioversions: 03/22/21, 11/23/23 Previous ablations: none CHADS2VASC score: 6 Anticoagulation history: Eliquis    Past Medical History:  Diagnosis Date   Abnormal stress test    Acute encephalopathy 05/05/2021   Aftercare following surgery of the circulatory system, NEC 12/19/2013   Arteriosclerosis of coronary artery 11/16/2015   Formatting of this note might be different from the original.  Formatting of this note might be different from the original.   Formatting of this note might be different from the original.  On imaging;  Formatting of this note might be different from the original.  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  02/2021  Formatting of this note might be different from the original.  Formatting of this note might be different from the original.  02/2021     Chronic kidney disease    cysts on both kidneys, seeing Starleen Eastern in [place], [facility]    Coronary arteriosclerosis 11/16/2015   Formatting of this note might be different from the original. On imaging;   COVID 05/05/2021   COVID-19 virus infection 05/05/2021   Cramps of left lower extremity-Calf  > right calf 01/01/2015   Cyst, meninges, spinal 11/16/2015   Degeneration of lumbar intervertebral disc 11/16/2015   Formatting of this note might be different from the original. signficant DDD present with vacuum effect L4-5 and L5-S1 flat discs and anterior spurring noted   Dizziness 05/06/2020   Dyslipidemia 03/18/2015   Dyspnea on exertion 04/02/2020   Ear itch 05/14/2018   Edema 11/16/2015   Edema of both lower extremities due to peripheral venous insufficiency 02/03/2020   Elevated tumor markers 12/15/2017   Erectile dysfunction 11/16/2015   First degree AV block 10/20/2020   Gait abnormality 04/19/2022   Heart block 05/05/2021   Heart failure (HCC)    History of hiatal hernia    seen on last scan- as slight    History  of thrombocytopenia    History of TIAs    Hyperlipidemia    Hypertension    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 [place]. And Dr. Debbie Fails of this note might be different from the original.  Formatting of this note might be different from the original.  Stage 1A. [place]. And Dr. Debbie Fails of this note might be different from the original.  Stage 1A. [place]. And Dr. Waymond Hailey Adenocarcinoma (non-small  cell). Observation.     Malignant neoplasm of left lung (HCC) 12/08/2017   Stage 1A. [place]. And Dr. 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  Formatting of this note might be different from the original.  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 Renal parapelvic cysts 10/06/16 on CT , right kidney midpole 1.2 cm, right kidney anterior pole 1.1 cm, and to the left kidney lower pole anterior position 3.5cm all felt to be benign   Right bundle branch block 10/20/2020   Right lower lobe pulmonary nodule 11/09/2017   Secondary hypercoagulable state (HCC) 08/11/2021   Shingles    internal- obstruction of bowels & gallbladder    Sinus bradycardia 05/05/2021   Solitary pulmonary nodule 11/16/2016   CT ABd 03/24/09 linerar densities in RLL and RML c/w scarring o/w clear CT Abd 10/20/16 c/w 7 mm nodule  - CT chest 02/13/2017 :  slt enlarged to 9 mm   - PET 04/04/17 :1. These slowly enlarging 9 mm right lower lobe pulmonary nodule along the right hemidiaphragm does not appear hypermetabolic today. Location adjacent to the hemidiaphragm can cause false negatives due to motion artifact related to the   Sprain of left ankle 05/21/2021   Stage 3a chronic kidney disease (HCC) 06/17/2020   Stroke (HCC)    Swelling of limb-Left foot 01/01/2015   Symptomatic bradycardia 03/18/2015   Thrombocytopenia (HCC) 11/16/2015   Typical atrial flutter (HCC) 03/19/2021   Vitamin B12 deficiency 06/21/2017   Weakness of left lower extremity 09/21/2023   Past Surgical History:  Procedure Laterality Date   adenocarcinoma  2019   Removed   CARDIOVERSION N/A 03/22/2021   Procedure: CARDIOVERSION;  Surgeon: Lenise Quince, MD;  Location: [facility] ENDOSCOPY;  Service: Cardiovascular;  Laterality: N/A;   CARDIOVERSION N/A 12/15/2021   Procedure: CARDIOVERSION;  Surgeon: Bridgette Campus, MD;  Location: [facility] ENDOSCOPY;  Service: Cardiovascular;  Laterality: N/A;   CARDIOVERSION N/A 11/23/2023   Procedure: CARDIOVERSION;  Surgeon: Hugh Madura, MD;  Location: [facility] INVASIVE CV LAB;  Service: Cardiovascular;  Laterality: N/A;   CAROTID ENDARTERECTOMY  2005   Left carotid by Dr. Nolene Baumgarten   CAROTID ENDARTERECTOMY  04/25/11   Right Carotid by Dr. Nolene Baumgarten   COLONOSCOPY  02/23/2017   Colonic polyp status post polypectomy. Pancolonic diverticulosis predominatly in the sigmoid colon. Tubular adenoma   LEAD REVISION/REPAIR N/A 08/16/2021   Procedure: LEAD REVISION/REPAIR;  Surgeon: Lei Pump, MD;  Location: [facility] INVASIVE CV LAB;  Service: Cardiovascular;  Laterality: N/A;   LEFT HEART CATH AND CORONARY ANGIOGRAPHY N/A 02/08/2021   Procedure: LEFT HEART CATH AND  CORONARY ANGIOGRAPHY;  Surgeon: Odie Benne, MD;  Location: [facility] INVASIVE CV LAB;  Service: Cardiovascular;  Laterality: N/A;   PACEMAKER IMPLANT N/A 07/28/2021   Procedure: PACEMAKER IMPLANT;  Surgeon: Lei Pump, MD;  Location:  [facility] INVASIVE CV LAB;  Service: Cardiovascular;  Laterality: N/A;   Septoplasty, nasal w/ submucosal resection  1960   SQUAMOUS CELL CARCINOMA EXCISION     pt said numerous times   TEE WITHOUT CARDIOVERSION N/A 03/22/2021   Procedure: TRANSESOPHAGEAL ECHOCARDIOGRAM (TEE);  Surgeon: Lenise Quince, MD;  Location: [facility] ENDOSCOPY;  Service: Cardiovascular;  Laterality: N/A;   TONSILLECTOMY     VIDEO ASSISTED THORACOSCOPY (VATS)/WEDGE RESECTION Right 11/09/2017   Procedure: RIGHT VIDEO ASSISTED THORACOSCOPY WITH Gentry Kief RESECTION;  Surgeon: Zelphia Higashi, MD;  Location: [facility] OR;  Service: Thoracic;  Laterality: Right;    Current Outpatient Medications  Medication Sig Dispense Refill   colesevelam  (WELCHOL ) 625 MG tablet Take 1,250 mg by mouth 2 (two) times daily with a meal.     furosemide  (LASIX ) 20 MG tablet Take 1 tablet (20 mg total) by mouth daily as needed.     naftifine  (NAFTIN ) 1 % cream Apply topically daily. (Patient taking differently: Apply 1 Application topically daily as needed (foot).) 60 g 0   Polyethyl Glycol-Propyl Glycol (SYSTANE) 0.4-0.3 % SOLN Place 1 drop into both eyes daily.     potassium chloride  (KLOR-CON  M) 10 MEQ tablet TAKE 1 TABLET (10 MEQ TOTAL) BY MOUTH DAILY AS NEEDED (TAKE ON DAYS LASIX  IS TAKEN). 90 tablet 1   rivaroxaban  (XARELTO ) 20 MG TABS tablet Take 1 tablet (20 mg total) by mouth daily with supper. 30 tablet 5   rosuvastatin  (CRESTOR ) 40 MG tablet Take 1 tablet (40 mg total) by mouth at bedtime.     No current facility-administered medications for this encounter.    No Known Allergies  ROS- All systems are reviewed and negative except as per the HPI above.  Physical Exam: Vitals:   02/26/24 1103  BP:  (!) 144/100  Pulse: 63  Weight: 72.3 kg  Height: 5\' 8"  (1.727 m)     GEN- The patient is well appearing, alert and oriented x 3 today.   Neck - no JVD or carotid bruit noted Lungs- Clear to ausculation bilaterally, normal work of breathing Heart- Regular rate (V paced), no murmurs, rubs or gallops, PMI not laterally displaced Extremities- no clubbing, cyanosis, or edema Skin - no rash or ecchymosis noted   Wt Readings from Last 3 Encounters:  02/26/24 72.3 kg  02/12/24 72.6 kg  02/06/24 72.6 kg    EKG today demonstrates  Vent. rate 63 BPM PR interval * ms QRS duration 146 ms QT/QTcB 506/517 ms P-R-T axes 233 -21 84 Ventricular-paced rhythm Abnormal ECG When compared with ECG of 21-Dec-2023 08:59, Vent. rate has decreased BY 20 BPM  Echo 03/19/21 demonstrated   1. Left ventricular ejection fraction, by estimation, is 60 to 65%. The left ventricle has normal function. The left ventricle has no regional wall motion abnormalities. There is mild left ventricular hypertrophy. Left ventricular diastolic parameters are indeterminate.   2. Right ventricular systolic function is normal. The right ventricular  size is normal.   3. The mitral valve is normal in structure. Mild mitral valve  regurgitation. No evidence of mitral stenosis.   4. The aortic valve is calcified. Aortic valve regurgitation is trivial.  Mild to moderate aortic valve sclerosis/calcification is present, without any evidence of aortic stenosis.   5. The inferior vena cava is normal in size with greater than 50%  respiratory variability, suggesting right atrial pressure of 3 mmHg.   Epic records are reviewed at length today  CHA2DS2-VASc Score = 5  The patient's score is  based upon: CHF History: 1 HTN History: 1 Diabetes History: 0 Stroke History: 0 Vascular Disease History: 1 Age Score: 2 Gender Score: 0       ASSESSMENT AND PLAN: 1. Persistent Atrial Fibrillation/atrial flutter The patient's  CHA2DS2-VASc score is 5, indicating a 7.2% annual risk of stroke.   S/p successful DCCV on 11/23/23.  He is currently in V paced rhythm (atrial flutter). Will discuss with Dr. Lawana Pray regarding potential for Tikosyn noted in last OV - appears his QT interval may be prolonged. Patient stopped amiodarone  due to adverse effects. If Dr. Lawana Pray does not think Tikosyn is an option then may have to continue with rate control strategy.   2. Secondary Hypercoagulable State (ICD10:  D68.69) The patient is at significant risk for stroke/thromboembolism based upon his CHA2DS2-VASc Score of 5.  Continue Apixaban  (Eliquis ).  No missed doses.   3. 2nd degree AV block S/p PPM, followed by Dr Lawana Pray.   Follow up Afib clinic prn.    Woody Heading, PA-C Afib Clinic [facility] [address] [place], [place] [address] [phone] 02/26/2024 12:00 PM

## 2024-02-29 NOTE — Telephone Encounter (Signed)
 Called and spoke to pt relayed results/recommendations. Pts voiced gratitude and understanding of all discussed

## 2024-02-29 NOTE — Telephone Encounter (Signed)
 Continue Physical therapy.  Cervical degenerative changes is not severe enough to consider surgical intervention,   Keep follow up as previous scheduled,   If there is sudden changes or has lots of questions, may move up his followup  Contact facility that he had MRI, films can be burned into a disk

## 2024-02-29 NOTE — Telephone Encounter (Signed)
 DR Gracie LavAlwyn Baas and relayed results to pt wants to know what are next steps  Debra (medical records): Pt would like to obtain the imaging that was done

## 2024-03-06 ENCOUNTER — Encounter: Payer: Self-pay | Admitting: Cardiology

## 2024-03-06 ENCOUNTER — Ambulatory Visit: Attending: Cardiology | Admitting: Cardiology

## 2024-03-06 VITALS — BP 138/80 | HR 80 | Ht 68.0 in | Wt 159.2 lb

## 2024-03-06 DIAGNOSIS — I5032 Chronic diastolic (congestive) heart failure: Secondary | ICD-10-CM | POA: Diagnosis not present

## 2024-03-06 DIAGNOSIS — I2510 Atherosclerotic heart disease of native coronary artery without angina pectoris: Secondary | ICD-10-CM | POA: Insufficient documentation

## 2024-03-06 DIAGNOSIS — E785 Hyperlipidemia, unspecified: Secondary | ICD-10-CM | POA: Insufficient documentation

## 2024-03-06 DIAGNOSIS — I459 Conduction disorder, unspecified: Secondary | ICD-10-CM | POA: Diagnosis present

## 2024-03-06 DIAGNOSIS — I4819 Other persistent atrial fibrillation: Secondary | ICD-10-CM | POA: Insufficient documentation

## 2024-03-06 DIAGNOSIS — C3490 Malignant neoplasm of unspecified part of unspecified bronchus or lung: Secondary | ICD-10-CM | POA: Diagnosis present

## 2024-03-06 DIAGNOSIS — D6869 Other thrombophilia: Secondary | ICD-10-CM | POA: Insufficient documentation

## 2024-03-06 NOTE — Progress Notes (Signed)
 Cardiology Office Note:    Date:  03/06/2024   ID:  OMARRI EICH, DOB 1941/08/25, MRN [id number]  PCP:  Abbe Hoard., MD  Cardiologist:  Ralene Burger, MD    Referring MD: Abbe Hoard., MD   No chief complaint on file.   History of Present Illness:    Louis Barton is a 83 y.o. 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  Comes today 2 months for follow-up overall he seems to be quite optimistic like always he comes with his daughter who participate in decision-making.  Denies have any chest pain tightness squeezing pressure burning chest complain of being weak tired exhausted as well as sleeping a lot he said he is fine until he have his breakfast after he gets his breakfast he is exhausted tired and sometimes have to go to sleep.  Swelling of lower extremities there like always he tell me that him struggling with swelling legs since he was 28.  Swelling of left arm also improved quite significantly.  He is in atrial fibrillation/flutter  Past Medical History:  Diagnosis Date   Abnormal stress test    Acute encephalopathy 05/05/2021   Aftercare following surgery of the circulatory system, NEC 12/19/2013   Arteriosclerosis of coronary artery 11/16/2015   Formatting of this note might be different from the original.  Formatting of this note might be different from the original.  Formatting of this note might be different from the original.  On imaging;  Formatting of this note might be different from the original.  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  02/2021  Formatting of this note might be different from the original.  Formatting of this note might be different from the original.  02/2021     Chronic kidney disease    cysts on both kidneys, seeing Starleen Eastern in [place], [facility]    Coronary arteriosclerosis 11/16/2015   Formatting of this note might be different from the original. On imaging;   COVID 05/05/2021   COVID-19 virus infection 05/05/2021   Cramps of left lower extremity-Calf  > right calf 01/01/2015   Cyst, meninges, spinal 11/16/2015   Degeneration of lumbar intervertebral disc 11/16/2015   Formatting of this note might be different from the original. signficant DDD present with vacuum effect L4-5 and L5-S1 flat discs and anterior spurring noted   Dizziness 05/06/2020   Dyslipidemia 03/18/2015   Dyspnea on exertion 04/02/2020   Ear itch 05/14/2018   Edema 11/16/2015   Edema of both lower extremities due to peripheral venous insufficiency 02/03/2020   Elevated tumor markers 12/15/2017   Erectile dysfunction 11/16/2015   First degree AV block 10/20/2020  Gait abnormality 04/19/2022   Heart block 05/05/2021   Heart failure (HCC)    History of hiatal hernia    seen on last scan- as slight    History of thrombocytopenia    History of TIAs    Hypercoagulable state due to persistent atrial fibrillation (HCC) 08/11/2021   Hyperlipidemia    Hypertension    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 [place]. And Dr. Debbie Fails of this note might be different from the original.  Formatting of this note might be different from the original.  Stage 1A. [place]. And Dr. Debbie Fails of this note might be different from the original.  Stage 1A. [place]. And Dr. Waymond Hailey Adenocarcinoma (non-small cell). Observation.     Malignant neoplasm of left lung (HCC) 12/08/2017   Stage 1A. [place]. And Dr. 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  Formatting of this note might be different from the original.  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 These slowly enlarging 9 mm right lower lobe  pulmonary nodule along the right hemidiaphragm does not appear hypermetabolic today. Location adjacent to the hemidiaphragm can cause false negatives due to motion artifact related to the   Sprain of left ankle 05/21/2021   Stage 3a chronic kidney disease (HCC) 06/17/2020   Stroke (HCC)    Swelling of limb-Left foot 01/01/2015   Symptomatic bradycardia 03/18/2015   Thrombocytopenia (HCC) 11/16/2015   Typical atrial flutter (HCC) 03/19/2021   Vitamin B12 deficiency 06/21/2017   Weakness of left lower extremity 09/21/2023    Past Surgical History:  Procedure Laterality Date   adenocarcinoma  2019   Removed   CARDIOVERSION N/A 03/22/2021   Procedure: CARDIOVERSION;  Surgeon: Lenise Quince, MD;  [facility] ENDOSCOPY;  Service: Cardiovascular;  Laterality: N/A;   CARDIOVERSION N/A 12/15/2021   Procedure: CARDIOVERSION;  Surgeon: Bridgette Campus, MD;  Location: [facility] ENDOSCOPY;  Service: Cardiovascular;  Laterality: N/A;   CARDIOVERSION N/A 11/23/2023   Procedure: CARDIOVERSION;  Surgeon: Hugh Madura, MD;  Location: [facility] INVASIVE CV LAB;  Service: Cardiovascular;  Laterality: N/A;   CAROTID ENDARTERECTOMY  2005   Left carotid by Dr. Nolene Baumgarten   CAROTID ENDARTERECTOMY  04/25/11   Right Carotid by Dr. Nolene Baumgarten   COLONOSCOPY  02/23/2017   Colonic polyp status post polypectomy. Pancolonic diverticulosis predominatly in the sigmoid colon. Tubular adenoma   LEAD REVISION/REPAIR N/A 08/16/2021   Procedure: LEAD REVISION/REPAIR;  Surgeon: Lei Pump, MD;  Location: [facility] INVASIVE CV LAB;  Service: Cardiovascular;  Laterality: N/A;   LEFT HEART CATH AND CORONARY ANGIOGRAPHY N/A 02/08/2021   Procedure: LEFT HEART CATH AND CORONARY ANGIOGRAPHY;  Surgeon: Odie Benne, MD;  Location: [facility] INVASIVE CV LAB;  Service: Cardiovascular;  Laterality: N/A;   PACEMAKER IMPLANT N/A 07/28/2021   Procedure: PACEMAKER IMPLANT;  Surgeon: Lei Pump, MD;  Location: [facility] INVASIVE CV LAB;  Service:  Cardiovascular;  Laterality: N/A;   Septoplasty, nasal w/ submucosal resection  1960   SQUAMOUS CELL CARCINOMA EXCISION     pt said numerous times   TEE WITHOUT CARDIOVERSION N/A 03/22/2021   Procedure: TRANSESOPHAGEAL ECHOCARDIOGRAM (TEE);  Surgeon: Lenise Quince, MD;  Location: [facility] ENDOSCOPY;  Service: Cardiovascular;  Laterality: N/A;   TONSILLECTOMY     VIDEO ASSISTED THORACOSCOPY (VATS)/WEDGE RESECTION Right 11/09/2017   Procedure: RIGHT VIDEO ASSISTED THORACOSCOPY WITH Gentry Kief RESECTION;  Surgeon: Zelphia Higashi, MD;  Location: [facility] OR;  Service: Thoracic;  Laterality: Right;    Current Medications: Current Meds  Medication Sig   colesevelam  (WELCHOL ) 625 MG tablet Take 1,250 mg by mouth 2 (two) times daily with a meal.   furosemide  (LASIX ) 20 MG tablet Take 20 mg by mouth daily as needed for fluid or edema.   naftifine  (NAFTIN ) 1 % cream Apply topically daily. (Patient taking differently: Apply 1 Application topically daily as needed (foot).)   Polyethyl Glycol-Propyl Glycol (SYSTANE) 0.4-0.3 % SOLN Place 1 drop into both eyes daily.   potassium chloride  (KLOR-CON  M) 10 MEQ tablet TAKE 1 TABLET (10 MEQ TOTAL) BY MOUTH DAILY AS NEEDED (TAKE ON DAYS LASIX  IS TAKEN).   rivaroxaban  (XARELTO ) 20 MG TABS tablet Take 1 tablet (20 mg total) by mouth daily with supper.   rosuvastatin  (CRESTOR ) 40 MG tablet Take 1 tablet (40 mg total) by mouth at bedtime.     Allergies:   Patient has no known allergies.   Social History   Socioeconomic History   Marital status: Widowed    Spouse name: Not on file   Number of children: 2   Years of education: Not on file   Highest education level: Not on file  Occupational History   Not on file  Tobacco Use   Smoking status: Former    Current packs/day: 0.00    Average packs/day: 1 pack/day for 16.0 years (16.0 ttl pk-yrs)    Types: Cigarettes    Start date: 09/26/1956    Quit date: 09/26/1972    Years since quitting: 51.4   Smokeless tobacco:  Never   Tobacco comments:    Former smoker 08/11/21  Vaping Use   Vaping status: Never Used  Substance and Sexual Activity   Alcohol  use: Not Currently    Alcohol /week: 7.0 standard drinks of alcohol     Types: 7 Shots of liquor per week    Comment: 1 shot daily 08/11/21   Drug use: No   Sexual activity:  Not Currently  Other Topics Concern   Not on file  Social History Narrative   Pt lives alone    Retired    Social Drivers of Corporate investment banker Strain: Not on file  Food Insecurity: No Food Insecurity (09/05/2023)   Hunger Vital Sign    Worried About Running Out of Food in the Last Year: Never true    Ran Out of Food in the Last Year: Never true  Transportation Needs: No Transportation Needs (09/05/2023)   PRAPARE - Administrator, Civil Service (Medical): No    Lack of Transportation (Non-Medical): No  Physical Activity: Not on file  Stress: Not on file  Social Connections: Not on file     Family History: The patient's family history includes Cancer in his brother and sister; Cancer (age of onset: 57) in his mother; Deep vein thrombosis in his mother; Heart attack in his father and mother; Heart disease in his mother; Hyperlipidemia in his mother; Hypertension in his mother; Memory loss in his paternal grandfather; Other in his mother and sister; Stroke in his mother; Varicose Veins in his mother. There is no history of Colon cancer, Esophageal cancer, Rectal cancer, Stomach cancer, Alzheimer's disease, or Dementia. ROS:   Please see the history of present illness.    All 14 point review of systems negative except as described per history of present illness  EKGs/Labs/Other Studies Reviewed:         Recent Labs: 09/04/2023: Magnesium 1.9 12/20/2023: ALT 16; NT-Pro BNP 2,585; TSH 2.720 01/01/2024: BUN 17; Creatinine, Ser 1.01; Potassium 3.8; Sodium 141 02/01/2024: Hemoglobin 15.8; Platelets 141  Recent Lipid Panel    Component Value Date/Time   CHOL  159 11/21/2018 0836   TRIG 82 11/21/2018 0836   HDL 59 11/21/2018 0836   CHOLHDL 2.7 11/21/2018 0836   LDLCALC 84 11/21/2018 0836    Physical Exam:    VS:  BP (!) 150/92   Pulse 80   Ht 5' 8 (1.727 m)   Wt 159 lb 3.2 oz (72.2 kg)   SpO2 96%   BMI 24.21 kg/m     Wt Readings from Last 3 Encounters:  03/06/24 159 lb 3.2 oz (72.2 kg)  02/26/24 159 lb 6.4 oz (72.3 kg)  02/12/24 160 lb (72.6 kg)     GEN:  Well nourished, well developed in no acute distress HEENT: Normal NECK: No JVD; No carotid bruits LYMPHATICS: No lymphadenopathy CARDIAC: RRR, no murmurs, no rubs, no gallops RESPIRATORY:  Clear to auscultation without rales, wheezing or rhonchi  ABDOMEN: Soft, non-tender, non-distended MUSCULOSKELETAL:  No edema; No deformity  SKIN: Warm and dry LOWER EXTREMITIES: no swelling NEUROLOGIC:  Alert and oriented x 3 PSYCHIATRIC:  Normal affect   ASSESSMENT:    1. Coronary artery disease involving native coronary artery of native heart without angina pectoris   2. Chronic heart failure with preserved ejection fraction (HCC)   3. Hypercoagulable state due to persistent atrial fibrillation (HCC)   4. Heart block   5. 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)   6. Dyslipidemia    PLAN:    In order of problems listed above:  Coronary disease stable on appropriate medications.  Denies have any signs and symptoms that would indicate reactivation of the problem. Atrial fibrillation which appears to be permanent right now.  Will try amiodarone , we had discussion with him about potentially dofetilide.  He favors just simply rhythm control strategy which I think is the right  option.  Continue Xarelto  20. Left arm vein thrombosis.  On Xarelto  doing well swelling is almost gone. Dyslipidemia I do not have a recent fasting lipid profile he is on WelChol  as well as Crestor  which I will continue.  Will call primary care physician to get fasting lipid profile. Congestive heart failure  systolic ejection fraction preserved, now takes Lasix  on as-needed basis.  Use elastic stockings for swelling of lower extremities.  Stable   Medication Adjustments/Labs and Tests Ordered: Current medicines are reviewed at length with the patient today.  Concerns regarding medicines are outlined above.  No orders of the defined types were placed in this encounter.  Medication changes: No orders of the defined types were placed in this encounter.   Signed, Manfred Seed, MD, [facility] 03/06/2024 8:53 AM    [facility]

## 2024-03-06 NOTE — Patient Instructions (Signed)
 Medication Instructions:  Your physician recommends that you continue on your current medications as directed. Please refer to the Current Medication list given to you today.  *If you need a refill on your cardiac medications before your next appointment, please call your pharmacy*  Lab Work: None If you have labs (blood work) drawn today and your tests are completely normal, you will receive your results only by: MyChart Message (if you have MyChart) OR A paper copy in the mail If you have any lab test that is abnormal or we need to change your treatment, we will call you to review the results.  Testing/Procedures: None  Follow-Up: At [facility], you and your health needs are our priority.  As part of our continuing mission to provide you with exceptional heart care, our providers are all part of one team.  This team includes your primary Cardiologist (physician) and Advanced Practice Providers or APPs (Physician Assistants and Nurse Practitioners) who all work together to provide you with the care you need, when you need it.  Your next appointment:   3 month(s)  Provider:   Ralene Burger, MD    We recommend signing up for the patient portal called MyChart.  Sign up information is provided on this After Visit Summary.  MyChart is used to connect with patients for Virtual Visits (Telemedicine).  Patients are able to view lab/test results, encounter notes, upcoming appointments, etc.  Non-urgent messages can be sent to your provider as well.   To learn more about what you can do with MyChart, go to [email or web address].   Other Instructions None

## 2024-03-07 NOTE — Progress Notes (Signed)
 Remote pacemaker transmission.

## 2024-03-11 ENCOUNTER — Ambulatory Visit: Admitting: Podiatry

## 2024-03-14 ENCOUNTER — Ambulatory Visit (HOSPITAL_COMMUNITY): Admitting: Internal Medicine

## 2024-04-02 ENCOUNTER — Ambulatory Visit (INDEPENDENT_AMBULATORY_CARE_PROVIDER_SITE_OTHER): Admitting: Podiatry

## 2024-04-02 ENCOUNTER — Encounter: Payer: Self-pay | Admitting: Podiatry

## 2024-04-02 DIAGNOSIS — Z7901 Long term (current) use of anticoagulants: Secondary | ICD-10-CM

## 2024-04-02 DIAGNOSIS — B351 Tinea unguium: Secondary | ICD-10-CM

## 2024-04-02 DIAGNOSIS — Q828 Other specified congenital malformations of skin: Secondary | ICD-10-CM | POA: Diagnosis not present

## 2024-04-02 DIAGNOSIS — M79674 Pain in right toe(s): Secondary | ICD-10-CM

## 2024-04-02 DIAGNOSIS — M79675 Pain in left toe(s): Secondary | ICD-10-CM | POA: Diagnosis not present

## 2024-04-02 DIAGNOSIS — I739 Peripheral vascular disease, unspecified: Secondary | ICD-10-CM

## 2024-04-02 MED ORDER — CICLOPIROX 8 % EX SOLN: Freq: Every day | CUTANEOUS | 12 refills | Status: DC

## 2024-04-02 NOTE — Patient Instructions (Signed)
 More silicone and felt pads can be purchased from:  [email or web address]  More dancers pads can be found on [employer] which can offload the fifth metatarsal phalangeal joint prominence.  Look for urea 40% cream or ointment and apply to the thickened dry skin / calluses. This can be bought over the counter, at a pharmacy or online such as [employer].

## 2024-04-02 NOTE — Progress Notes (Signed)
  Subjective:  Patient ID: Louis Barton, male    DOB: 07-09-1941,  MRN: [id number]  Chief Complaint  Patient presents with   RFC    RFC with callous, on the left lateral. Not diabetic and no anti coag.     83 y.o. male presents with the above complaint. History confirmed with patient. Patient presenting with pain related to dystrophic thickened elongated nails. Patient is unable to trim own nails related to nail dystrophy and/or mobility issues. Patient does not have a history of T2DM. Patient does have hyperkeratotic lesion present located at the left subfifth metatarsal head causing pain.   Objective:  Physical Exam: warm, capillary refill time 3 to 5 seconds the digits, decreased pedal hair growth, pedal skin atrophic nail exam onychomycosis of the toenails, onycholysis, and dystrophic nails DP pulses palpable, PT pulses palpable, and protective sensation intact, +2 pitting edema present.  Skin to lower extremities is indurated with changes consistent with chronic venous stasis. Left Foot:  Pain with palpation of nails due to elongation and dystrophic growth.  Hyperkeratotic lesion left subfifth metatarsal head, nucleated with core Right Foot: Pain with palpation of nails due to elongation and dystrophic growth.   Assessment:   1. Porokeratosis   2. PVD (peripheral vascular disease) (HCC)   3. Pain due to onychomycosis of toenails of both feet   4. Chronic anticoagulation      Plan:  Patient was evaluated and treated and all questions answered.  #Hyperkeratotic lesions present left subfifth metatarsal head All symptomatic hyperkeratoses x 1 separate lesions were safely debrided with a sterile #312 blade to patient's level of comfort without incident. We discussed preventative and palliative care of these lesions including supportive and accommodative shoegear, padding, prefabricated and custom molded accommodative orthoses, use of a pumice stone and lotions/creams daily. - At risk  footcare due to PVD, long term anticoagulation therapy  #Onychomycosis with pain  -Nails palliatively debrided as below. -Educated on self-care - On chronic anticoagulation therapy - Starting Penlac  for nails.  Medication sent in.  Procedure: Nail Debridement Rationale: Pain Type of Debridement: manual, sharp debridement. Instrumentation: Nail nipper, rotary burr. Number of Nails: 10  Return in about 3 months (around 07/03/2024) for Routine Foot Care.         Ethan Saddler, DPM [facility] / [facility]

## 2024-04-22 ENCOUNTER — Telehealth: Payer: Self-pay

## 2024-04-22 ENCOUNTER — Inpatient Hospital Stay: Payer: Medicare Other | Attending: Oncology

## 2024-04-22 DIAGNOSIS — C3490 Malignant neoplasm of unspecified part of unspecified bronchus or lung: Secondary | ICD-10-CM

## 2024-04-22 DIAGNOSIS — Z85118 Personal history of other malignant neoplasm of bronchus and lung: Secondary | ICD-10-CM | POA: Insufficient documentation

## 2024-04-22 DIAGNOSIS — R970 Elevated carcinoembryonic antigen [CEA]: Secondary | ICD-10-CM | POA: Diagnosis not present

## 2024-04-22 LAB — CBC WITH DIFFERENTIAL (CANCER CENTER ONLY)
Abs Immature Granulocytes: 0.03 K/uL (ref 0.00–0.07)
Basophils Absolute: 0 K/uL (ref 0.0–0.1)
Basophils Relative: 0 %
Eosinophils Absolute: 0.1 K/uL (ref 0.0–0.5)
Eosinophils Relative: 2 %
HCT: 43.4 % (ref 39.0–52.0)
Hemoglobin: 14.1 g/dL (ref 13.0–17.0)
Immature Granulocytes: 1 %
Immature Platelet Fraction: 4 % (ref 1.2–8.6)
Lymphocytes Relative: 21 %
Lymphs Abs: 1.3 K/uL (ref 0.7–4.0)
MCH: 30.7 pg (ref 26.0–34.0)
MCHC: 32.5 g/dL (ref 30.0–36.0)
MCV: 94.3 fL (ref 80.0–100.0)
Monocytes Absolute: 0.8 K/uL (ref 0.1–1.0)
Monocytes Relative: 13 %
Neutro Abs: 3.9 K/uL (ref 1.7–7.7)
Neutrophils Relative %: 63 %
Platelet Count: 118 K/uL — ABNORMAL LOW (ref 150–400)
RBC: 4.6 MIL/uL (ref 4.22–5.81)
RDW: 14.1 % (ref 11.5–15.5)
WBC Count: 6.2 K/uL (ref 4.0–10.5)
nRBC: 0 % (ref 0.0–0.2)

## 2024-04-22 LAB — CMP (CANCER CENTER ONLY)
ALT: 9 U/L (ref 0–44)
AST: 22 U/L (ref 15–41)
Albumin: 3.4 g/dL — ABNORMAL LOW (ref 3.5–5.0)
Alkaline Phosphatase: 57 U/L (ref 38–126)
Anion gap: 10 (ref 5–15)
BUN: 17 mg/dL (ref 8–23)
CO2: 27 mmol/L (ref 22–32)
Calcium: 8.9 mg/dL (ref 8.9–10.3)
Chloride: 105 mmol/L (ref 98–111)
Creatinine: 0.99 mg/dL (ref 0.61–1.24)
GFR, Estimated: >60 mL/min (ref >60)
Glucose, Bld: 95 mg/dL (ref 70–99)
Potassium: 4.1 mmol/L (ref 3.5–5.1)
Sodium: 142 mmol/L (ref 135–145)
Total Bilirubin: 0.7 mg/dL (ref 0.0–1.2)
Total Protein: 5.8 g/dL — ABNORMAL LOW (ref 6.5–8.1)

## 2024-04-22 LAB — CEA (ACCESS): CEA (CHCC): 12.35 ng/mL — ABNORMAL HIGH (ref 0.00–5.00)

## 2024-04-22 NOTE — Telephone Encounter (Signed)
 Patient walked into the office and his left forearm and hand were edematous. Patient's left hand had +2 pitting edema. Patient stated that he had blood clots in his left arm and his hand was not draining properly. Spoke with Dr. Krasowski regarding this patient and he recommended having the patient purchase a compression arm sleeve. I relayed this recommendation to the patient and he verbalized understanding and had no further questions at this time.

## 2024-04-25 ENCOUNTER — Ambulatory Visit: Payer: Medicare Other

## 2024-04-25 ENCOUNTER — Inpatient Hospital Stay: Payer: Medicare Other | Admitting: Oncology

## 2024-04-25 DIAGNOSIS — I441 Atrioventricular block, second degree: Secondary | ICD-10-CM

## 2024-04-25 LAB — CUP PACEART REMOTE DEVICE CHECK
Battery Remaining Longevity: 109 mo
Battery Voltage: 3 V
Brady Statistic RA Percent Paced: 0.02 %
Brady Statistic RV Percent Paced: 99.34 %
Date Time Interrogation Session: 20250729215719
Implantable Lead Connection Status: 753985
Implantable Lead Connection Status: 753985
Implantable Lead Implant Date: 20221102
Implantable Lead Implant Date: 20221121
Implantable Lead Location: 753859
Implantable Lead Location: 753860
Implantable Lead Model: 3830
Implantable Lead Model: 5076
Implantable Pulse Generator Implant Date: 20221102
Lead Channel Impedance Value: 247 Ohm
Lead Channel Impedance Value: 304 Ohm
Lead Channel Impedance Value: 380 Ohm
Lead Channel Impedance Value: 418 Ohm
Lead Channel Pacing Threshold Amplitude: 0.625 V
Lead Channel Pacing Threshold Amplitude: 0.625 V
Lead Channel Pacing Threshold Pulse Width: 0.4 ms
Lead Channel Pacing Threshold Pulse Width: 0.4 ms
Lead Channel Sensing Intrinsic Amplitude: 1.125 mV
Lead Channel Sensing Intrinsic Amplitude: 1.125 mV
Lead Channel Sensing Intrinsic Amplitude: 5.875 mV
Lead Channel Sensing Intrinsic Amplitude: 5.875 mV
Lead Channel Setting Pacing Amplitude: 1.5 V
Lead Channel Setting Pacing Amplitude: 2 V
Lead Channel Setting Pacing Pulse Width: 0.4 ms
Lead Channel Setting Sensing Sensitivity: 0.9 mV
Zone Setting Status: 755011
Zone Setting Status: 755011

## 2024-04-26 ENCOUNTER — Ambulatory Visit: Payer: Self-pay | Admitting: Cardiology

## 2024-04-30 ENCOUNTER — Emergency Department (HOSPITAL_COMMUNITY)
Admission: EM | Admit: 2024-04-30 | Discharge: 2024-04-30 | Disposition: A | Discharge status: Home / Self Care | Attending: Emergency Medicine | Admitting: Emergency Medicine

## 2024-04-30 ENCOUNTER — Emergency Department (HOSPITAL_COMMUNITY)

## 2024-04-30 ENCOUNTER — Encounter (HOSPITAL_COMMUNITY): Payer: Self-pay

## 2024-04-30 ENCOUNTER — Inpatient Hospital Stay: Attending: Hematology and Oncology | Admitting: Hematology and Oncology

## 2024-04-30 ENCOUNTER — Other Ambulatory Visit: Payer: Self-pay

## 2024-04-30 ENCOUNTER — Encounter: Payer: Self-pay | Admitting: Hematology and Oncology

## 2024-04-30 VITALS — BP 139/89 | HR 80 | Temp 98.0°F | Resp 16 | Ht 68.0 in | Wt 166.7 lb

## 2024-04-30 DIAGNOSIS — R0602 Shortness of breath: Secondary | ICD-10-CM | POA: Insufficient documentation

## 2024-04-30 DIAGNOSIS — Z85118 Personal history of other malignant neoplasm of bronchus and lung: Secondary | ICD-10-CM | POA: Insufficient documentation

## 2024-04-30 DIAGNOSIS — I2510 Atherosclerotic heart disease of native coronary artery without angina pectoris: Secondary | ICD-10-CM | POA: Diagnosis not present

## 2024-04-30 DIAGNOSIS — R0902 Hypoxemia: Secondary | ICD-10-CM

## 2024-04-30 DIAGNOSIS — C3490 Malignant neoplasm of unspecified part of unspecified bronchus or lung: Secondary | ICD-10-CM

## 2024-04-30 DIAGNOSIS — Z7901 Long term (current) use of anticoagulants: Secondary | ICD-10-CM | POA: Diagnosis not present

## 2024-04-30 DIAGNOSIS — I10 Essential (primary) hypertension: Secondary | ICD-10-CM | POA: Insufficient documentation

## 2024-04-30 DIAGNOSIS — Z860101 Personal history of adenomatous and serrated colon polyps: Secondary | ICD-10-CM | POA: Insufficient documentation

## 2024-04-30 DIAGNOSIS — R609 Edema, unspecified: Secondary | ICD-10-CM

## 2024-04-30 DIAGNOSIS — E119 Type 2 diabetes mellitus without complications: Secondary | ICD-10-CM | POA: Diagnosis not present

## 2024-04-30 DIAGNOSIS — I82612 Acute embolism and thrombosis of superficial veins of left upper extremity: Secondary | ICD-10-CM | POA: Insufficient documentation

## 2024-04-30 DIAGNOSIS — I82C12 Acute embolism and thrombosis of left internal jugular vein: Secondary | ICD-10-CM | POA: Insufficient documentation

## 2024-04-30 LAB — BASIC METABOLIC PANEL WITH GFR
Anion gap: 10 (ref 5–15)
BUN: 15 mg/dL (ref 8–23)
CO2: 25 mmol/L (ref 22–32)
Calcium: 8.9 mg/dL (ref 8.9–10.3)
Chloride: 106 mmol/L (ref 98–111)
Creatinine, Ser: 0.89 mg/dL (ref 0.61–1.24)
GFR, Estimated: >60 mL/min (ref >60)
Glucose, Bld: 86 mg/dL (ref 70–99)
Potassium: 4.7 mmol/L (ref 3.5–5.1)
Sodium: 141 mmol/L (ref 135–145)

## 2024-04-30 LAB — CBC
HCT: 47.4 % (ref 39.0–52.0)
Hemoglobin: 15.1 g/dL (ref 13.0–17.0)
MCH: 30.6 pg (ref 26.0–34.0)
MCHC: 31.9 g/dL (ref 30.0–36.0)
MCV: 96 fL (ref 80.0–100.0)
Platelets: 149 K/uL — ABNORMAL LOW (ref 150–400)
RBC: 4.94 MIL/uL (ref 4.22–5.81)
RDW: 13.9 % (ref 11.5–15.5)
WBC: 6.9 K/uL (ref 4.0–10.5)
nRBC: 0 % (ref 0.0–0.2)

## 2024-04-30 LAB — BRAIN NATRIURETIC PEPTIDE: B Natriuretic Peptide: 466.3 pg/mL — ABNORMAL HIGH (ref 0.0–100.0)

## 2024-04-30 LAB — TROPONIN I (HIGH SENSITIVITY)
Troponin I (High Sensitivity): 10 ng/L (ref <18)
Troponin I (High Sensitivity): 9 ng/L (ref <18)

## 2024-04-30 MED ORDER — IOHEXOL 350 MG/ML SOLN
75.0000 mL | Freq: Once | INTRAVENOUS | Status: AC | PRN
  Administered 2024-04-30: 75 mL via INTRAVENOUS

## 2024-04-30 MED ORDER — FUROSEMIDE 10 MG/ML IJ SOLN
40.0000 mg | Freq: Once | INTRAMUSCULAR | Status: AC
  Administered 2024-04-30: 40 mg via INTRAVENOUS
  Filled 2024-04-30: qty 4

## 2024-04-30 NOTE — ED Provider Notes (Signed)
 [facility] EM[facility]Y DE[facility]MENT AT [facility] Provider Note   CSN: [id number] Arrival date & time: 04/30/24  1330     Patient presents with: Shortness of Breath   Louis Barton is a 83 y.o. male.  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  He reports that shortness of breath has been worse over the last week.  Patient was sent from his oncologist office to rule out pulmonary embolism.  Patient denies any shortness of breath or chest pain at rest.  With ambulation this patient's oxygen saturation dropped down to 73%, no prior need for oxygen at home.  Does not have oxygen at home.    Shortness of Breath      Prior to Admission medications   Medication Sig Start Date End Date Taking? Authorizing Provider  ciclopirox  (PENLAC ) 8 % solution Apply topically at bedtime. Apply over nail and surrounding skin. Apply daily over previous coat. After seven (7) days, may remove with alcohol  and continue cycle. Patient not taking: Reported on 04/30/2024 04/02/24   Lamount Ethan CROME, DPM  colesevelam  (WELCHOL ) 625 MG tablet Take 1,250 mg by mouth 2 (two) times daily with a meal.    [provider]  furosemide  (LASIX ) 20 MG tablet Take 20 mg by mouth daily as needed for fluid or edema.    [provider]  naftifine  (NAFTIN ) 1 % cream Apply topically daily. Patient not taking: Reported on 04/30/2024 01/02/24   Krasowski, Robert J, MD  Polyethyl Glycol-Propyl Glycol (SYSTANE) 0.4-0.3 % SOLN Place 1 drop into both eyes daily.    [provider]  potassium chloride  (KLOR-CON  M) 10 MEQ tablet TAKE 1 TABLET (10 MEQ TOTAL) BY MOUTH DAILY AS NEEDED (TAKE ON DAYS LASIX  IS TAKEN). 01/01/24   Terra Fairy PARAS, PA-C  rivaroxaban  (XARELTO ) 20 MG TABS tablet Take 1  tablet (20 mg total) by mouth daily with supper. 02/13/24   Krasowski, Robert J, MD  rosuvastatin  (CRESTOR ) 40 MG tablet Take 1 tablet (40 mg total) by mouth at bedtime. 05/13/21   Perri DELENA Meliton Mickey., MD    Allergies: Patient has no known allergies.    Review of Systems  Respiratory:  Positive for shortness of breath.     Updated Vital Signs BP (!) 173/94 (BP Location: Right Arm)   Pulse 66   Temp 97.8 F (36.6 C)   Resp 18   SpO2 100%   Physical Exam Vitals and nursing note reviewed.  Constitutional:      General: He is not in acute distress.    Appearance: He is not toxic-appearing.  HENT:     Head: Normocephalic and atraumatic.  Eyes:     General: No scleral icterus.    Conjunctiva/sclera: Conjunctivae normal.  Cardiovascular:     Rate and Rhythm: Normal rate and regular rhythm.     Pulses: Normal pulses.     Heart sounds: Normal heart sounds.  Pulmonary:     Effort: Pulmonary effort is normal. No respiratory distress.     Breath sounds: Normal breath sounds.  Abdominal:     General: Abdomen is flat. Bowel sounds are normal.     Palpations: Abdomen is soft.     Tenderness: There is no abdominal tenderness.  Musculoskeletal:     Right lower leg: Edema present.  Left lower leg: Edema present.     Comments: Left upper extremity swelling, palpable radial pulse.  No sign of cellulitis.  Skin:    General: Skin is warm and dry.     Findings: No lesion.  Neurological:     General: No focal deficit present.     Mental Status: He is alert and oriented to person, place, and time. Mental status is at baseline.     (all labs ordered are listed, but only abnormal results are displayed) Labs Reviewed  CBC - Abnormal; Notable for the following components:      Result Value   Platelets 149 (*)    All other components within normal limits  BRAIN NATRIURETIC PEPTIDE - Abnormal; Notable for the following components:   B Natriuretic Peptide 466.3 (*)    All other  components within normal limits  BASIC METABOLIC PANEL WITH GFR  TROPONIN I (HIGH SENSITIVITY)  TROPONIN I (HIGH SENSITIVITY)    EKG: None  Radiology: CT Angio Chest PE W and/or Wo Contrast Result Date: 04/30/2024 CLINICAL DATA:  Shortness of breath and DVT. Concern for pulmonary embolism. Cancer patient. EXAM: CT ANGIOGRAPHY CHEST WITH CONTRAST TECHNIQUE: Multidetector CT imaging of the chest was performed using the standard protocol during bolus administration of intravenous contrast. Multiplanar CT image reconstructions and MIPs were obtained to evaluate the vascular anatomy. RADIATION DOSE REDUCTION: This exam was performed according to the departmental dose-optimization program which includes automated exposure control, adjustment of the mA and/or kV according to patient size and/or use of iterative reconstruction technique. CONTRAST:  75mL OMNIPAQUE  IOHEXOL  350 MG/ML SOLN COMPARISON:  Chest CT dated 09/03/2023. FINDINGS: Cardiovascular: There is mild cardiomegaly with dilatation of the right atrium. There is coronary vascular calcification. Mild atherosclerotic calcification of the thoracic aorta. No pulmonary artery embolus identified. Mediastinum/Nodes: No hilar or mediastinal adenopathy. The esophagus is grossly unremarkable. No mediastinal fluid collection. Lungs/Pleura: Small bilateral pleural effusions, loculated on the right. There is postsurgical changes of the right lung base with an area of rounded atelectasis. There is no pneumothorax. The central airways are patent. Upper Abdomen: No acute abnormality. Musculoskeletal: Osteopenia with degenerative changes of the spine. No acute osseous pathology. Review of the MIP images confirms the above findings. IMPRESSION: 1. No CT evidence of pulmonary artery embolus. 2. Small bilateral pleural effusions, loculated on the right. 3. Postsurgical changes of the right lung base with an area of rounded atelectasis. 4.  Aortic Atherosclerosis  (ICD10-I70.0). Electronically Signed   By: Vanetta Chou M.D.   On: 04/30/2024 17:11   UE VENOUS DUPLEX (7am - 7pm) Result Date: 04/30/2024 UPPER VENOUS STUDY  Patient Name:  Louis Barton  Date of Exam:   04/30/2024 Medical Rec #: [id number]      Accession #:    [id number] Date of Birth: 10/18/1940       Patient Gender: M Patient Age:   66 years Exam Location:  [facility] Procedure:      VAS US  UPPER EXTREMITY VENOUS DUPLEX Referring Phys: WARREN Saqib Cazarez --------------------------------------------------------------------------------  Indications: Edema Other Indications: Hx of LUE DVT on AC with increased swelling. Anticoagulation: Xarelto . Comparison Study: Previous exam on 01/24/2024 was postive for LUE DVT in IJV &                   SVT in basilic vein. Performing Technologist: Ezzie Potters RVT, RDMS  Examination Guidelines: A complete evaluation includes B-mode imaging, spectral Doppler, color Doppler, and power Doppler as needed of all accessible  portions of each vessel. Bilateral testing is considered an integral part of a complete examination. Limited examinations for reoccurring indications may be performed as noted.  Right Findings: +----------+------------+---------+-----------+----------+--------------------+ RIGHT     CompressiblePhasicitySpontaneousProperties      Summary        +----------+------------+---------+-----------+----------+--------------------+ Subclavian               Yes       Yes                   patent by                                                              color/doppler     +----------+------------+---------+-----------+----------+--------------------+  Left Findings: +----------+------------+---------+-----------+----------+--------------------+ LEFT      CompressiblePhasicitySpontaneousProperties      Summary        +----------+------------+---------+-----------+----------+--------------------+ IJV           Full       Yes       Yes                                    +----------+------------+---------+-----------+----------+--------------------+ Subclavian               Yes       Yes                   patent by                                                              color/doppler     +----------+------------+---------+-----------+----------+--------------------+ Axillary      Full       Yes       Yes                                   +----------+------------+---------+-----------+----------+--------------------+ Brachial      Full       Yes       Yes                                   +----------+------------+---------+-----------+----------+--------------------+ Radial        Full                                                       +----------+------------+---------+-----------+----------+--------------------+ Ulnar         Full                                                       +----------+------------+---------+-----------+----------+--------------------+ Cephalic  Full                                                       +----------+------------+---------+-----------+----------+--------------------+ Basilic       Full       Yes       Yes                                   +----------+------------+---------+-----------+----------+--------------------+ 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  Summary:  Right: No evidence of thrombosis in the subclavian.  Left: No evidence of superficial vein thrombosis in the upper extremity. Diffuse subcutaneous of left forearm.  *See table(s) above for measurements and observations.  Diagnosing physician: Penne Colorado MD Electronically signed by Penne Colorado MD on 04/30/2024 at 4:40:22 PM.    Final      Procedures   Medications Ordered in the ED - No data to display                                  Medical Decision Making Amount and/or Complexity of Data Reviewed Labs: ordered. Radiology:  ordered.  Risk Prescription drug management.   This patient presents to the ED for concern of shortness of breath, this involves an extensive number of treatment options, and is a complaint that carries with it a high risk of complications and morbidity.  The differential diagnosis includes embolism, congestive heart failure, pneumonia, pneumothorax   Co morbidities that complicate the patient evaluation  Cancer   Additional history obtained:  Additional history obtained from oncology office today here for PE study and further workup due to hypoxia   Lab Tests:  I personally interpreted labs.  The pertinent results include:   No leukocytosis.  No anemia.  Troponins flat.  BNP mildly elevated at 460   Imaging Studies ordered:  I ordered imaging studies including CT of the chest, venous ultrasound I independently visualized and interpreted imaging which showed no PE, small bilateral pleural effusion, no upper extremity DVT I agree with the radiologist interpretation   Cardiac Monitoring: / EKG:  The patient was maintained on a cardiac monitor.  I personally viewed and interpreted the cardiac monitored which showed an underlying rhythm of: Paced rhythm   Problem List / ED Course / Critical interventions / Medication management  Patient reporting to emergency room with complaint of shortness of breath.  This has been ongoing for quite some time but worse over the last week.  Was hypoxic with ambulation today at oncologist office.  He was sent here to rule out pulmonary embolism as he has left upper extremity DVT.  Arrival hemodynamically stable well-appearing no acute distress.  He denies any associated chest pain.  Symptoms do not seem consistent with ACS.  PE study is negative.  He does have mild fluid overload on exam, which he reports is chronic, but does have some pleural effusions and mildly elevated BNP which is likely contributing.  He was given Lasix  and responded well.   Patient was able to ambulate with steady gait, no hypoxia.  He he would like to be discharged home and follow-up outpatient.  He has as needed p.o. Lasix . I have reviewed the patients home  medicines and have made adjustments as needed Feel stable for discharge with outpatient follow-up.       Final diagnoses:  SOB (shortness of breath)    ED Discharge Orders     None          Louis Barton 04/30/24 2012    Dean Clarity, MD 04/30/24 2306

## 2024-04-30 NOTE — ED Notes (Signed)
 Patient transported to CT

## 2024-04-30 NOTE — ED Provider Triage Note (Signed)
 Emergency Medicine Provider Triage Evaluation Note  Louis Barton , a 83 y.o. male  was evaluated in triage.  Pt complains of apparently 1 week of shortness of breath worse with exertion like doing stairs.  Reports he has left arm DVT on blood thinner.  Has noted worsening swelling.  He was sent here to rule out pulmonary embolism given that he has a DVT.  He has been compliant with his blood thinners.  Continues to have quite symptomatic left arm swelling pain in relation to this DVT.  Review of Systems  Positive: SOb Negative: CP  Physical Exam  BP (!) 173/94 (BP Location: Right Arm)   Pulse 66   Temp 97.8 F (36.6 C)   Resp 18   SpO2 100%  Gen:   Awake, no distress   Resp:  Normal effort  MSK:   Moves extremities without difficulty  Other:  Left arm swelling.  Medical Decision Making  Medically screening exam initiated at 2:26 PM.  Appropriate orders placed.  Louis Barton was informed that the remainder of the evaluation will be completed by another provider, this initial triage assessment does not replace that evaluation, and the importance of remaining in the ED until their evaluation is complete.  Ordered CT angio of chest to rule out blood clot.  Obtaining repeat upper DVT study.  Patient is getting next available room.   Shermon Warren SAILOR, PA-C 04/30/24 1427

## 2024-04-30 NOTE — ED Triage Notes (Signed)
 Pt arrives via POV. PT reports he is taking xarelto  for known blood clots in his left arm. Pt reports sob over the past week. His provider sent him to the ED to rule out PE. PT is Axox4. Denies chest pain. Denies missing any doses of his xarelto .

## 2024-04-30 NOTE — ED Notes (Signed)
 O2 saturation walk test: O2 sat 100% while walking down hallway

## 2024-04-30 NOTE — Discharge Instructions (Signed)
 Your CT scan shows no blood clot.  You can take your at home Lasix  as needed for edema or shortness of breath.  Please return to emergency room with new or worsening symptoms.

## 2024-04-30 NOTE — Progress Notes (Signed)
 [facility]  [address] [place],  [place]  [address] [phone]  Clinic Day:  04/30/2024  Referring physician: Thurmond Cathlyn LABOR., MD   CHIEF COMPLAINT:  CC: History of stage IA non-small cell lung cancer  Current Treatment:  Surveillance  HISTORY OF PRESENT ILLNESS:  Louis Barton is a 83 y.o. 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  We began seeing him in March 2019 for follow-up.  He was found to have an enlarging right lower lobe nodule in January 2019 and had a wedge resection by VATS procedure by Dr. Elspeth Millers in February.  Pathology revealed an invasive moderately differentiated adenocarcinoma measuring 1 cm with 14 - nodes.  His CEA was elevated at 9 postoperatively.He is on observation only.  He is also being followed by the urologist for multiple renal cysts.  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  Colonoscopy in May 2018 revealed colon polyps with pathology revealing tubular adenomas.  Upper endoscopy and colonoscopy in 2019 did not reveal any malignancy, so we feel we have ruled out a gastrointestinal source as the cause of the increased CEA.  The CEA increased to 10.5 in November, but then was back down to 8.9 in February.  Repeat CT imaging of the chest, abdomen and pelvis did not reveal any evidence of malignancy.  Previously seen right pleural effusion had decreased, there was a similar appearance of the suspected rounded atelectasis at the right lung base, which was also decreased.  CEA at that time was 9.9.  There was stable bilateral renal cysts.  He had surgery of the left lateral and posterior neck for additional squamous cell carcinomas of the skin.  Repeat CT chest, abdomen and pelvis in October revealed stable rounded presumed atelectasis in the right lower lobe along the resection site with stable scarring, stable volume loss in the right lower lobe and right middle lobe, as well as  stable small adjacent right pleural effusion with adjacent pleural thickening without changes suggestive of active malignancy.  The bilateral renal cysts were stable.   CT imaging from May 2021 revealed stable appearance of right pleural thickening, trace pleural effusion and adjacent density along the wedge resection site favoring rounded atelectasis and scarring in the right lower lobe.  No compelling findings of active malignancy.  CEA was 10.5, previously 9.7, but this tends to fluctuate up and down in this range.  He has some residual effects from COVID-19 that he had in November 2020. CEA from May 2022 was up to 11.0 from 10.3 the year before. CT scans of abdomen and pelvis were negative.  CT chest in September 2021 showed scarring only.  We continued to monitor him regularly.  CT chest, abdomen and pelvis in December 2024 did not reveal any evidence of recurrence. 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 Pelvic and femur X-rays were also normal. X-rays of the left ribs showed acute fractures of the anterior aspects of the left 9th and 10th ribs. CT chest, abdomen, and pelvis revealed acute fractures of the left ninth and tenth ribs but no pneumothorax, small right and trace left pleural effusions with associated atelectasis, and soft tissue attenuation in the left inguinal canal has developed since prior exam. Myelogram was recommended.  He has had  chronic dizziness and falls. His falls improved after medications were adjusted in December.     Oncology History  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)  11/14/2017 Cancer Staging   Staging form: Lung, AJCC 8th Edition - Clinical stage from 11/14/2017: Stage IA1 (cT1a, cN0, cM0) - Signed by Cornelius Wanda DEL, MD on 08/17/2021 Histopathologic type: Adenocarcinoma,  NOS Stage prefix: Initial diagnosis Histologic grade (G): G2 Histologic grading system: 4 grade system Laterality: Left Tumor size (mm): 10 Lymph-vascular invasion (LVI): LVI not present (absent)/not identified Diagnostic confirmation: Positive histology Specimen type: Excision Staged by: Managing physician Type of lung cancer: Surgically resected non-small cell lung cancer ECOG performance status: Grade 1 Perineural invasion (PNI): Absent Weight loss: Absent Stage used in treatment planning: Yes National guidelines used in treatment planning: Yes Type of national guideline used in treatment planning: NCCN Staging comments: Surgical resection   12/08/2017 Initial Diagnosis   Malignant neoplasm of left lung (HCC)       INTERVAL HISTORY:  [name] is here today for routine follow up of his lung cancer. He denies fevers or chills. He denies pain. His appetite is good. His weight has increased 7 pounds over last 6 months. He reports chronic dizziness times 2 years, occasional trouble walking. Medications changed in December and not as many falls. MRI brain and cervical spine in May revealed ***. Dr. Onita, recommended PT, he has not continued.  He reports occasional episode of shortness of breath just in the past week. Reports unchanged chronic cough productive of creamy sputum, COVID cough. Denies chest pain. Denies fevers, chills, night sweats. CT imaging December post op changes, atelectasis.  He states he had dvt left upper extremity with associated swelling in March, despite xarelto  20 mg daily.  He states his cardiologist increased xarelto  to 15 mg bid for 3 weeks.  He is now back on xarelto  20 mg.  He has persistent swelling right upper extremity.  He also has chronic bilateral lower extremity.  LUE U/S April 30: Findings consistent with acute deep vein thrombosis involving the left internal jugular vein. Findings consistent with acute superficial vein thrombosis involving the left  basilic vein.   Now seeing dermatology in [place]. Had lesions removed from left preauricular area, left leg just below knee-basal cell. Had been using 5-fu cream right arm x 2 weeks in July. Derm recommended continuing.    REVIEW OF SYSTEMS:  Review of Systems  Constitutional:  Negative for appetite change, chills, diaphoresis, fatigue, fever and unexpected weight change.  HENT:   Negative for lump/mass, mouth sores and sore throat.   Respiratory:  Positive for shortness of breath. Negative for cough.   Cardiovascular:  Positive for leg swelling. Negative for chest pain and palpitations.  Gastrointestinal:  Negative for abdominal pain, constipation, diarrhea, nausea and vomiting.  Genitourinary:  Negative for difficulty urinating, dysuria, frequency and hematuria.   Musculoskeletal:  Negative for arthralgias, back pain, gait problem and myalgias.  Skin:  Negative for itching, rash and wound.  Neurological:  Positive for dizziness. Negative for extremity weakness, gait problem, headaches and numbness.  Hematological:  Negative for adenopathy.  Psychiatric/Behavioral:  Negative for depression and sleep disturbance. The patient is not nervous/anxious.      VITALS:  Blood pressure 139/89, pulse 80, temperature 98 F (36.7 C), temperature source Oral, resp. rate 16, height 5' 8 (1.727 m), weight 166 lb 11.2 oz (75.6 kg), SpO2 (!) 73%.  Wt Readings from Last 3 Encounters:  04/30/24 166 lb 11.2 oz (75.6  kg)  03/06/24 159 lb 3.2 oz (72.2 kg)  02/26/24 159 lb 6.4 oz (72.3 kg)    Body mass index is 25.35 kg/m.  Performance status (ECOG): 2 - Symptomatic, <50% confined to bed  Pulse oximetry dropped from 93% on room air at rest to 76% with ambulation.  PHYSICAL EXAM:  Physical Exam Vitals and nursing note reviewed.  Constitutional:      General: He is not in acute distress.    Appearance: Normal appearance. He is normal weight.  HENT:     Head: Normocephalic and atraumatic.      Mouth/Throat:     Mouth: Mucous membranes are moist.     Pharynx: Oropharynx is clear. No oropharyngeal exudate or posterior oropharyngeal erythema.  Eyes:     General: No scleral icterus.    Extraocular Movements: Extraocular movements intact.     Conjunctiva/sclera: Conjunctivae normal.     Pupils: Pupils are equal, round, and reactive to light.  Cardiovascular:     Rate and Rhythm: Normal rate and regular rhythm.  Pulmonary:     Effort: Pulmonary effort is normal.     Breath sounds: Examination of the right-lower field reveals decreased breath sounds. Decreased breath sounds present. No wheezing, rhonchi or rales.  Abdominal:     General: Bowel sounds are normal. There is no distension.     Palpations: Abdomen is soft. There is no hepatomegaly, splenomegaly or mass.     Tenderness: There is no abdominal tenderness.  Musculoskeletal:        General: Swelling (2+ left upper extremity) present. Normal range of motion.     Cervical back: Normal range of motion and neck supple. No tenderness.     Right lower leg: Edema (2-3+) present.     Left lower leg: Edema (2-3+) present.  Lymphadenopathy:     Cervical: No cervical adenopathy.  Skin:    General: Skin is warm and dry.     Coloration: Skin is not jaundiced.     Findings: Lesion present. No rash.     Comments: Diffuse scaly lesions left upper extremity  Neurological:     Mental Status: He is alert and oriented to person, place, and time.     Cranial Nerves: No cranial nerve deficit.  Psychiatric:        Mood and Affect: Mood normal.        Behavior: Behavior normal.        Thought Content: Thought content normal.     LABS:      Latest Ref Rng & Units 04/30/2024    2:26 PM 04/22/2024   10:35 AM 02/01/2024    9:59 AM  CBC  WBC 4.0 - 10.5 K/uL 6.9  6.2  7.6   Hemoglobin 13.0 - 17.0 g/dL 84.8  85.8  84.1   Hematocrit 39.0 - 52.0 % 47.4  43.4  47.1   Platelets 150 - 400 K/uL 149  118  141       Latest Ref Rng & Units  04/30/2024    2:26 PM 04/22/2024   10:35 AM 01/01/2024    4:14 PM  CMP  Glucose 70 - 99 mg/dL 86  95  72   BUN 8 - 23 mg/dL 15  17  17    Creatinine 0.61 - 1.24 mg/dL 9.10  9.00  8.98   Sodium 135 - 145 mmol/L 141  142  141   Potassium 3.5 - 5.1 mmol/L 4.7  4.1  3.8   Chloride 98 - 111  mmol/L 106  105  104   CO2 22 - 32 mmol/L 25  27  24    Calcium  8.9 - 10.3 mg/dL 8.9  8.9  8.7   Total Protein 6.5 - 8.1 g/dL  5.8    Total Bilirubin 0.0 - 1.2 mg/dL  0.7    Alkaline Phos 38 - 126 U/L  57    AST 15 - 41 U/L  22    ALT 0 - 44 U/L  9       Lab Results  Component Value Date   CEA1 11.4 (H) 04/19/2023   CEA 12.35 (H) 04/22/2024   /  CEA  Date Value Ref Range Status  04/19/2023 11.4 (H) 0.0 - 4.7 ng/mL Final    Comment:    (NOTE)                             Nonsmokers          <3.9                             Smokers             <5.6 Roche [employer] Electrochemiluminescence Immunoassay (ECLIA) Values obtained with different assay methods or kits cannot be used interchangeably.  Results cannot be interpreted as absolute evidence of the presence or absence of malignant disease. Performed At: [facility] [address] [place], [place] [id number] Jennette Shorter MD Ey:[id number]    CEA [facility])  Date Value Ref Range Status  04/22/2024 12.35 (H) 0.00 - 5.00 ng/mL Final    Comment:    (NOTE) This test was performed using Beckman Coulter's paramagnetic chemiluminescent immunoassay. Values obtained from different assay methods cannot be used interchangeably. Please note that up to 8% of patients who smoke may see values 5.1-10.0 ng/ml and 1% of patients who smoke may see CEA levels >10.0 ng/ml. Performed at [facility], [address], [place], [place] [address]    No results found for: PSA1 No results found for: RJW800 No results found for: CAN125  No results found for: TOTALPROTELP, ALBUMINELP, A1GS, A2GS, BETS, BETA2SER,  GAMS, MSPIKE, SPEI No results found for: TIBC, FERRITIN, IRONPCTSAT No results found for: LDH  STUDIES:  CT Angio Chest PE W and/or Wo Contrast Result Date: 04/30/2024 CLINICAL DATA:  Shortness of breath and DVT. Concern for pulmonary embolism. Cancer patient. EXAM: CT ANGIOGRAPHY CHEST WITH CONTRAST TECHNIQUE: Multidetector CT imaging of the chest was performed using the standard protocol during bolus administration of intravenous contrast. Multiplanar CT image reconstructions and MIPs were obtained to evaluate the vascular anatomy. RADIATION DOSE REDUCTION: This exam was performed according to the departmental dose-optimization program which includes automated exposure control, adjustment of the mA and/or kV according to patient size and/or use of iterative reconstruction technique. CONTRAST:  75mL OMNIPAQUE  IOHEXOL  350 MG/ML SOLN COMPARISON:  Chest CT dated 09/03/2023. FINDINGS: Cardiovascular: There is mild cardiomegaly with dilatation of the right atrium. There is coronary vascular calcification. Mild atherosclerotic calcification of the thoracic aorta. No pulmonary artery embolus identified. Mediastinum/Nodes: No hilar or mediastinal adenopathy. The esophagus is grossly unremarkable. No mediastinal fluid collection. Lungs/Pleura: Small bilateral pleural effusions, loculated on the right. There is postsurgical changes of the right lung base with an area of rounded atelectasis. There is no pneumothorax. The central airways are patent. Upper Abdomen: No acute abnormality. Musculoskeletal: Osteopenia with degenerative changes of the spine. No  acute osseous pathology. Review of the MIP images confirms the above findings. IMPRESSION: 1. No CT evidence of pulmonary artery embolus. 2. Small bilateral pleural effusions, loculated on the right. 3. Postsurgical changes of the right lung base with an area of rounded atelectasis. 4.  Aortic Atherosclerosis (ICD10-I70.0). Electronically Signed   By: Vanetta Chou M.D.   On: 04/30/2024 17:11   UE VENOUS DUPLEX (7am - 7pm) Result Date: 04/30/2024 UPPER VENOUS STUDY  Patient Name:  DESMUND ELMAN  Date of Exam:   04/30/2024 Medical Rec #: [id number]      Accession #:    [id number] Date of Birth: 1941/05/05       Patient Gender: M Patient Age:   33 years Exam Location:  [facility] Procedure:      VAS US  UPPER EXTREMITY VENOUS DUPLEX Referring Phys: WARREN BARRETT --------------------------------------------------------------------------------  Indications: Edema Other Indications: Hx of LUE DVT on AC with increased swelling. Anticoagulation: Xarelto . Comparison Study: Previous exam on 01/24/2024 was postive for LUE DVT in IJV &                   SVT in basilic vein. Performing Technologist: Ezzie Potters RVT, RDMS  Examination Guidelines: A complete evaluation includes B-mode imaging, spectral Doppler, color Doppler, and power Doppler as needed of all accessible portions of each vessel. Bilateral testing is considered an integral part of a complete examination. Limited examinations for reoccurring indications may be performed as noted.  Right Findings: +----------+------------+---------+-----------+----------+--------------------+ RIGHT     CompressiblePhasicitySpontaneousProperties      Summary        +----------+------------+---------+-----------+----------+--------------------+ Subclavian               Yes       Yes                   patent by                                                              color/doppler     +----------+------------+---------+-----------+----------+--------------------+  Left Findings: +----------+------------+---------+-----------+----------+--------------------+ LEFT      CompressiblePhasicitySpontaneousProperties      Summary        +----------+------------+---------+-----------+----------+--------------------+ IJV           Full       Yes       Yes                                    +----------+------------+---------+-----------+----------+--------------------+ Subclavian               Yes       Yes                   patent by                                                              color/doppler     +----------+------------+---------+-----------+----------+--------------------+ Axillary      Full       Yes  Yes                                   +----------+------------+---------+-----------+----------+--------------------+ Brachial      Full       Yes       Yes                                   +----------+------------+---------+-----------+----------+--------------------+ Radial        Full                                                       +----------+------------+---------+-----------+----------+--------------------+ Ulnar         Full                                                       +----------+------------+---------+-----------+----------+--------------------+ Cephalic      Full                                                       +----------+------------+---------+-----------+----------+--------------------+ Basilic       Full       Yes       Yes                                   +----------+------------+---------+-----------+----------+--------------------+ 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  Summary:  Right: No evidence of thrombosis in the subclavian.  Left: No evidence of superficial vein thrombosis in the upper extremity. Diffuse subcutaneous of left forearm.  *See table(s) above for measurements and observations.  Diagnosing physician: Penne Colorado MD Electronically signed by Penne Colorado MD on 04/30/2024 at 4:40:22 PM.    Final    CUP PACEART REMOTE DEVICE CHECK Result Date: 04/25/2024 PPM Scheduled remote reviewed. Normal device function.  Presenting rhythm:  AF/VP Persistent AF, controlled rates, Xarelto  per PA report Next remote 91 days. LA, CVRS     HISTORY:   Past Medical  History:  Diagnosis Date   Abnormal stress test    Acute encephalopathy 05/05/2021   Aftercare following surgery of the circulatory system, NEC 12/19/2013   Arteriosclerosis of coronary artery 11/16/2015   Formatting of this note might be different from the original.  Formatting of this note might be different from the original.  Formatting of this note might be different from the original.  On imaging;  Formatting of this note might be different from the original.  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  02/2021  Formatting of this note might be different from the original.  Formatting of this note might be different from the original.  02/2021     Chronic kidney disease    cysts on both kidneys, seeing DOROTHA Sharps in [place], [facility]    Coronary arteriosclerosis 11/16/2015   Formatting of this note might be different from the original. On imaging;   COVID 05/05/2021   COVID-19 virus infection 05/05/2021   Cramps of left lower extremity-Calf  > right calf 01/01/2015   Cyst, meninges, spinal 11/16/2015   Degeneration of lumbar intervertebral disc 11/16/2015   Formatting of this note might be different from the original. signficant DDD present with vacuum effect L4-5 and L5-S1 flat discs and anterior spurring noted   Dizziness 05/06/2020   Dyslipidemia 03/18/2015   Dyspnea on exertion 04/02/2020   Ear itch 05/14/2018   Edema 11/16/2015   Edema of both lower extremities  due to peripheral venous insufficiency 02/03/2020   Elevated tumor markers 12/15/2017   Erectile dysfunction 11/16/2015   First degree AV block 10/20/2020   Gait abnormality 04/19/2022   Heart block 05/05/2021   Heart failure (HCC)    History of hiatal hernia    seen on last scan- as slight    History of thrombocytopenia    History of TIAs    Hypercoagulable state due to persistent atrial fibrillation (HCC) 08/11/2021   Hyperlipidemia    Hypertension    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 [place]. And Dr. Darlean Deal of this note might be different from the original.  Formatting of this note might be different from the original.  Stage 1A. [place]. And Dr. Darlean Deal of this note might be different from the original.  Stage 1A. [place]. And Dr. Darlean Adenocarcinoma (non-small cell). Observation.     Malignant neoplasm of left lung (HCC) 12/08/2017   Stage 1A. [place]. And Dr. 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  Formatting of this note might be different from the original.  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  Renal parapelvic cysts 10/06/16 on CT , right kidney midpole 1.2 cm, right kidney anterior pole 1.1 cm, and to the left kidney lower pole anterior position 3.5cm all felt to be benign   Right bundle branch block 10/20/2020   Right lower lobe pulmonary nodule 11/09/2017   Secondary hypercoagulable  state (HCC) 08/11/2021   Shingles    internal- obstruction of bowels & gallbladder   Sinus bradycardia 05/05/2021   Solitary pulmonary nodule 11/16/2016   CT ABd 03/24/09 linerar densities in RLL and RML c/w scarring o/w clear CT Abd 10/20/16 c/w 7 mm nodule  - CT chest 02/13/2017 :  slt enlarged to 9 mm   - PET 04/04/17 :1. These slowly enlarging 9 mm right lower lobe pulmonary nodule along the right hemidiaphragm does not appear hypermetabolic today. 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   Stroke (HCC)    Swelling of limb-Left foot 01/01/2015   Symptomatic bradycardia 03/18/2015   Thrombocytopenia (HCC) 11/16/2015   Typical atrial flutter (HCC) 03/19/2021   Vitamin B12 deficiency 06/21/2017   Weakness of left lower extremity 09/21/2023    Past Surgical History:  Procedure Laterality Date   adenocarcinoma  2019   Removed   CARDIOVERSION N/A 03/22/2021   Procedure: CARDIOVERSION;  Surgeon: Pietro Redell RAMAN, MD;  Location: [facility] ENDOSCOPY;  Service: Cardiovascular;  Laterality: N/A;   CARDIOVERSION N/A 12/15/2021   Procedure: CARDIOVERSION;  Surgeon: Alvan Ronal BRAVO, MD;  Location: [facility] ENDOSCOPY;  Service: Cardiovascular;  Laterality: N/A;   CARDIOVERSION N/A 11/23/2023   Procedure: CARDIOVERSION;  Surgeon: Jeffrie Oneil BROCKS, MD;  Location: [facility] INVASIVE CV LAB;  Service: Cardiovascular;  Laterality: N/A;   CAROTID ENDARTERECTOMY  2005   Left carotid by Dr. Harvey    CAROTID ENDARTERECTOMY  04/25/2011   Right Carotid by Dr. Harvey   COLONOSCOPY  02/23/2017   Colonic polyp status post polypectomy. Pancolonic diverticulosis predominatly in the sigmoid colon. Tubular adenoma   LEAD REVISION/REPAIR N/A 08/16/2021   Procedure: LEAD REVISION/REPAIR;  Surgeon: Inocencio Soyla Lunger, MD;  Location: [facility] INVASIVE CV LAB;  Service: Cardiovascular;  Laterality: N/A;   LEFT HEART CATH AND CORONARY ANGIOGRAPHY N/A 02/08/2021   Procedure: LEFT HEART CATH AND CORONARY ANGIOGRAPHY;  Surgeon: Verlin Lonni BIRCH, MD;  Location: [facility] INVASIVE CV LAB;  Service: Cardiovascular;  Laterality: N/A;   MOHS SURGERY     PACEMAKER IMPLANT N/A 07/28/2021   Procedure: PACEMAKER IMPLANT;  Surgeon: Inocencio Soyla Lunger, MD;  Location: [facility] INVASIVE CV LAB;  Service: Cardiovascular;  Laterality: N/A;   Septoplasty, nasal w/ submucosal resection  1960   SQUAMOUS CELL CARCINOMA EXCISION     pt said numerous times   TEE WITHOUT CARDIOVERSION N/A 03/22/2021   Procedure: TRANSESOPHAGEAL ECHOCARDIOGRAM (TEE);  Surgeon: Pietro Redell RAMAN, MD;  Location: [facility] ENDOSCOPY;  Service: Cardiovascular;  Laterality: N/A;   TONSILLECTOMY     VIDEO ASSISTED THORACOSCOPY (VATS)/WEDGE RESECTION Right 11/09/2017   Procedure: RIGHT VIDEO ASSISTED THORACOSCOPY WITH MERVIN RESECTION;  Surgeon: Kerrin Elspeth BROCKS, MD;  Location: [facility] OR;  Service: Thoracic;  Laterality: Right;    Family History  Problem Relation Age of Onset   Cancer Mother 70       Breast   Stroke Mother    Hyperlipidemia Mother    Other Mother        varicose veins   Heart attack Mother    Heart disease Mother        Left ankle swelling   Hypertension Mother    Deep vein thrombosis Mother    Varicose Veins  Mother    Heart attack Father    Other Sister        Tumor in Lung and Brain   Cancer Sister        Tumor   Lung  and  Brain   Cancer Brother        BCC-SCC-Merkle Cell   Memory loss Paternal Grandfather    Colon cancer Neg  Hx    Esophageal cancer Neg Hx    Rectal cancer Neg Hx    Stomach cancer Neg Hx    Alzheimer's disease Neg Hx    Dementia Neg Hx     Social History:  reports that he quit smoking about 51 years ago. His smoking use included cigarettes. He started smoking about 67 years ago. He has a 16 pack-year smoking history. He has never used smokeless tobacco. He reports that he does not currently use alcohol  after a past usage of about 7.0 standard drinks of alcohol  per week. He reports that he does not use drugs.The patient is accompanied by his wife today.  Allergies: No Known Allergies  Current Medications: Current Outpatient Medications  Medication Sig Dispense Refill   colesevelam  (WELCHOL ) 625 MG tablet Take 1,250 mg by mouth 2 (two) times daily with a meal.     furosemide  (LASIX ) 20 MG tablet Take 20 mg by mouth daily as needed for fluid or edema.     Polyethyl Glycol-Propyl Glycol (SYSTANE) 0.4-0.3 % SOLN Place 1 drop into both eyes daily.     potassium chloride  (KLOR-CON  M) 10 MEQ tablet TAKE 1 TABLET (10 MEQ TOTAL) BY MOUTH DAILY AS NEEDED (TAKE ON DAYS LASIX  IS TAKEN). 90 tablet 1   rivaroxaban  (XARELTO ) 20 MG TABS tablet Take 1 tablet (20 mg total) by mouth daily with supper. 30 tablet 5   rosuvastatin  (CRESTOR ) 40 MG tablet Take 1 tablet (40 mg total) by mouth at bedtime.     ciclopirox  (PENLAC ) 8 % solution Apply topically at bedtime. Apply over nail and surrounding skin. Apply daily over previous coat. After seven (7) days, may remove with alcohol  and continue cycle. (Patient not taking: Reported on 04/30/2024) 6.6 mL 12   naftifine  (NAFTIN ) 1 % cream Apply topically daily. (Patient not taking: Reported on 04/30/2024) 60 g 0   No current facility-administered medications for this visit.     ASSESSMENT & PLAN:   Assessment & Plan: GWYNN CROSSLEY is a 83 y.o. male with a history of stage IA non-small cell lung cancer.  He remains without obvious evidence of recurrence.  He had a new DVT of  the left upper extremity/jugular in April.  He reports new shortness of breath and because of hypoxia with exertion.  We are concerned that he may have a pulmonary embolism.  We recommend that he be transported to our local ED for emergent evaluation, as he is likely to require admission. The patient is agreeable to ED evaluation, but prefers to go via car with his daughter.  Vital signs are stable and he is stable at rest, so we feel comfortable with that.  The patient understands the plans discussed today and is in agreement with them.  He knows to contact our office if he develops concerns prior to his next appointment.    The patient was seen and plan formulated with Dr. Cornelius.   I provided *** minutes of face-to-face time during this encounter and > 50% was spent counseling as documented under my assessment and plan.    Alonte Wulff  A Shahrukh Pasch, PA-C  [facility] [place] - A DEPT OF [facility]. [facility] [address] [place] [address] Dept: [phone] Dept Fax: [phone]   No orders of the defined types were placed in this encounter.

## 2024-05-02 ENCOUNTER — Encounter: Payer: Self-pay | Admitting: Hematology and Oncology

## 2024-05-06 ENCOUNTER — Telehealth: Payer: Self-pay | Admitting: Hematology and Oncology

## 2024-05-06 NOTE — Telephone Encounter (Signed)
-----   Message from Andrez DELENA Foy sent at 05/03/2024  5:23 PM EDT ----- Please schedule labs and f/u with Dr. Brendan in 6 months. Thanks

## 2024-05-06 NOTE — Telephone Encounter (Signed)
 Patient has been scheduled. Aware of appt date and time.

## 2024-06-05 ENCOUNTER — Other Ambulatory Visit: Payer: Self-pay

## 2024-06-05 DIAGNOSIS — C44329 Squamous cell carcinoma of skin of other parts of face: Secondary | ICD-10-CM | POA: Insufficient documentation

## 2024-06-05 DIAGNOSIS — C44729 Squamous cell carcinoma of skin of left lower limb, including hip: Secondary | ICD-10-CM | POA: Insufficient documentation

## 2024-06-06 ENCOUNTER — Ambulatory Visit: Attending: Cardiology | Admitting: Cardiology

## 2024-06-06 ENCOUNTER — Encounter: Payer: Self-pay | Admitting: Cardiology

## 2024-06-06 VITALS — BP 134/80 | HR 66 | Ht 68.0 in | Wt 166.4 lb

## 2024-06-06 DIAGNOSIS — I5032 Chronic diastolic (congestive) heart failure: Secondary | ICD-10-CM | POA: Diagnosis present

## 2024-06-06 DIAGNOSIS — I7121 Aneurysm of the ascending aorta, without rupture: Secondary | ICD-10-CM | POA: Diagnosis not present

## 2024-06-06 DIAGNOSIS — I2510 Atherosclerotic heart disease of native coronary artery without angina pectoris: Secondary | ICD-10-CM | POA: Diagnosis not present

## 2024-06-06 DIAGNOSIS — I480 Paroxysmal atrial fibrillation: Secondary | ICD-10-CM | POA: Diagnosis present

## 2024-06-06 NOTE — Patient Instructions (Addendum)
 Medication Instructions:   TAKE: Lasix  Daily   Lab Work: Your physician recommends that you return for lab work in: Wednesday Sept 17 You can come Monday through Friday 8:30 am to 12:00 pm and 1:15 to 4:30. You do not need to make an appointment as the order has already been placed. The labs you are going to have done are BMET   Testing/Procedures: None Ordered   Follow-Up: At [facility], you and your health needs are our priority.  As part of our continuing mission to provide you with exceptional heart care, we have created designated Provider Care Teams.  These Care Teams include your primary Cardiologist (physician) and Advanced Practice Providers (APPs -  Physician Assistants and Nurse Practitioners) who all work together to provide you with the care you need, when you need it.  We recommend signing up for the patient portal called MyChart.  Sign up information is provided on this After Visit Summary.  MyChart is used to connect with patients for Virtual Visits (Telemedicine).  Patients are able to view lab/test results, encounter notes, upcoming appointments, etc.  Non-urgent messages can be sent to your provider as well.   To learn more about what you can do with MyChart, go to [email or web address].    Your next appointment:   1 week- Next Thursday  The format for your next appointment:   In Person  Provider:   Lamar Fitch, MD    Other Instructions NA

## 2024-06-06 NOTE — Progress Notes (Signed)
 Cardiology Office Note:    Date:  06/06/2024   ID:  Louis Barton, DOB [birth year], MRN [id number]  PCP:  Thurmond Cathlyn LABOR., MD  Cardiologist:  Lamar Fitch, MD    Referring MD: Thurmond Cathlyn LABOR., MD   No chief complaint on file.   History of Present Illness:    Louis Barton is a 83 y.o. 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  Comes today to my office for follow-up.  Since I have seen him last time he end up going to the emergency because of dyspnea exertion he was noted to have low saturation with exertion.  He was thought to be in decompensated congestive heart failure and some diuretic has been given.  His proBNP was also elevated.  Comes today to my office.  Looks pretty good actually he still gets swelling of lower extremities but mentally seems to be cheerful happy and completely with it.  He does not take diuretic on the regular basis, he was put on small dose of beta-blocker by primary care physician he cannot tell any difference interestingly he said this morning he had the rash to be on time in the office so he was taking shower very quickly he became quite significantly short of breath.  Past Medical History:  Diagnosis Date   Abnormal stress test    Acute encephalopathy 05/05/2021   Arteriosclerosis of coronary artery 11/16/2015   Formatting of this note might be different from the original.  Formatting of this note might be different from the original.  Formatting of this note might be different from the original.  On imaging;  Formatting of this note might be different from the original.  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  02/2021  Formatting of this note might be different from the original.  Formatting of this note might be different from the original.  02/2021     Chronic kidney disease    cysts on both kidneys, seeing DOROTHA Sharps in [place], [facility]    Coronary arteriosclerosis 11/16/2015   Formatting of this note might be different from the original. On imaging;   COVID 05/05/2021   COVID-19 virus infection 05/05/2021   Cramps of left lower extremity-Calf  > right calf 01/01/2015   Cyst, meninges, spinal 11/16/2015   Degeneration of lumbar intervertebral disc 11/16/2015   Formatting of this note might be different from the original. signficant DDD present with vacuum effect L4-5 and L5-S1 flat discs and anterior spurring noted   Dizziness 05/06/2020   Dyslipidemia 03/18/2015   Dyspnea on exertion 04/02/2020   Edema 11/16/2015   Edema of both lower extremities due to peripheral  venous insufficiency 02/03/2020   Elevated tumor markers 12/15/2017   Erectile dysfunction 11/16/2015   First degree AV block 10/20/2020   Gait abnormality 04/19/2022   Heart block 05/05/2021   Heart failure (HCC)    History of hiatal hernia    seen on last scan- as slight    History of thrombocytopenia    History of TIAs    Hypercoagulable state due to persistent atrial fibrillation (HCC) 08/11/2021   Hyperlipidemia    Hypertension    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 [place]. And Dr. Darlean Deal of this note might be different from the original.  Formatting of this note might be different from the original.  Stage 1A. [place]. And Dr. Darlean Deal of this note might be different from the original.  Stage 1A. [place]. And Dr. Darlean Adenocarcinoma (non-small cell). Observation.     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  Formatting of this note might be different from the original.  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 These slowly enlarging 9 mm right lower lobe pulmonary nodule along the right hemidiaphragm does not appear hypermetabolic today. 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    Stage 3a chronic kidney disease (HCC) 06/17/2020   Stroke (HCC)    Swelling of limb-Left foot 01/01/2015   Symptomatic bradycardia 03/18/2015   Thrombocytopenia (HCC) 11/16/2015   Typical atrial flutter (HCC) 03/19/2021   Vitamin B12 deficiency 06/21/2017   Weakness of left lower extremity 09/21/2023    Past Surgical History:  Procedure Laterality Date   adenocarcinoma  2019   Removed   CARDIOVERSION N/A 03/22/2021   Procedure: CARDIOVERSION;  Surgeon: Pietro Redell RAMAN, MD;  Location: [facility] ENDOSCOPY;  Service: Cardiovascular;  Laterality: N/A;   CARDIOVERSION N/A 12/15/2021  Procedure: CARDIOVERSION;  Surgeon: Alvan Ronal BRAVO, MD;  Location: [facility] ENDOSCOPY;  Service: Cardiovascular;  Laterality: N/A;   CARDIOVERSION N/A 11/23/2023   Procedure: CARDIOVERSION;  Surgeon: Jeffrie Oneil BROCKS, MD;  Location: [facility] INVASIVE CV LAB;  Service: Cardiovascular;  Laterality: N/A;   CAROTID ENDARTERECTOMY  2005   Left carotid by Dr. Harvey   CAROTID ENDARTERECTOMY  04/25/2011   Right Carotid by Dr. Harvey   COLONOSCOPY  02/23/2017   Colonic polyp status post polypectomy. Pancolonic diverticulosis predominatly in the sigmoid colon. Tubular adenoma   LEAD REVISION/REPAIR N/A 08/16/2021   Procedure: LEAD REVISION/REPAIR;  Surgeon: Inocencio Soyla Lunger, MD;  Location: [facility] INVASIVE CV LAB;  Service: Cardiovascular;  Laterality: N/A;   LEFT HEART CATH AND CORONARY ANGIOGRAPHY N/A 02/08/2021   Procedure: LEFT HEART CATH AND CORONARY ANGIOGRAPHY;  Surgeon: Verlin Lonni BIRCH, MD;  Location: [facility] INVASIVE CV LAB;  Service: Cardiovascular;  Laterality: N/A;   MOHS SURGERY     PACEMAKER IMPLANT N/A 07/28/2021   Procedure: PACEMAKER IMPLANT;  Surgeon: Inocencio Soyla Lunger, MD;  Location: [facility] INVASIVE CV LAB;   Service: Cardiovascular;  Laterality: N/A;   Septoplasty, nasal w/ submucosal resection  1960   SQUAMOUS CELL CARCINOMA EXCISION     pt said numerous times   TEE WITHOUT CARDIOVERSION N/A 03/22/2021   Procedure: TRANSESOPHAGEAL ECHOCARDIOGRAM (TEE);  Surgeon: Pietro Redell RAMAN, MD;  Location: [facility] ENDOSCOPY;  Service: Cardiovascular;  Laterality: N/A;   TONSILLECTOMY     VIDEO ASSISTED THORACOSCOPY (VATS)/WEDGE RESECTION Right 11/09/2017   Procedure: RIGHT VIDEO ASSISTED THORACOSCOPY WITH MERVIN RESECTION;  Surgeon: Kerrin Elspeth BROCKS, MD;  Location: [facility] OR;  Service: Thoracic;  Laterality: Right;    Current Medications: Current Meds  Medication Sig   colesevelam  (WELCHOL ) 625 MG tablet Take 1,250 mg by mouth 2 (two) times daily with a meal.   furosemide  (LASIX ) 20 MG tablet Take 20 mg by mouth daily as needed for fluid or edema.   metoprolol  succinate (TOPROL -XL) 25 MG 24 hr tablet Take 12.5 mg by mouth daily.   Polyethyl Glycol-Propyl Glycol (SYSTANE) 0.4-0.3 % SOLN Place 1 drop into both eyes daily.   potassium chloride  (KLOR-CON  M) 10 MEQ tablet TAKE 1 TABLET (10 MEQ TOTAL) BY MOUTH DAILY AS NEEDED (TAKE ON DAYS LASIX  IS TAKEN).   rivaroxaban  (XARELTO ) 20 MG TABS tablet Take 1 tablet (20 mg total) by mouth daily with supper.   rosuvastatin  (CRESTOR ) 40 MG tablet Take 1 tablet (40 mg total) by mouth at bedtime.     Allergies:   Patient has no known allergies.   Social History   Socioeconomic History   Marital status: Widowed    Spouse name: Not on file   Number of children: 2   Years of education: Not on file   Highest education level: Not on file  Occupational History   Not on file  Tobacco Use   Smoking status: Former    Current packs/day: 0.00    Average packs/day: 1 pack/day for 16.0 years (16.0 ttl pk-yrs)    Types: Cigarettes    Start date: 09/26/1956    Quit date: 09/26/1972    Years since quitting: 51.7   Smokeless tobacco: Never   Tobacco comments:    Former  smoker 08/11/21  Vaping Use   Vaping status: Never Used  Substance and Sexual Activity   Alcohol  use: Not Currently    Alcohol /week: 7.0 standard drinks of alcohol     Types: 7 Shots of liquor per week  Comment: 1 shot daily 08/11/21   Drug use: No   Sexual activity: Not Currently  Other Topics Concern   Not on file  Social History Narrative   Pt lives alone    Retired    Social Drivers of Corporate investment banker Strain: Not on file  Food Insecurity: Low Risk  (05/01/2024)   Received from [facility]   Hunger Vital Sign    Within the past 12 months, you worried that your food would run out before you got money to buy more: Never true    Within the past 12 months, the food you bought just didn't last and you didn't have money to get more. : Never true  Transportation Needs: No Transportation Needs (05/01/2024)   Received from [facility]    In the past 12 months, has lack of reliable transportation kept you from medical appointments, meetings, work or from getting things needed for daily living? : No  Physical Activity: Not on file  Stress: Not on file  Social Connections: Not on file     Family History: The patient's family history includes Cancer in his brother and sister; Cancer (age of onset: 77) in his mother; Deep vein thrombosis in his mother; Heart attack in his father and mother; Heart disease in his mother; Hyperlipidemia in his mother; Hypertension in his mother; Memory loss in his paternal grandfather; Other in his mother and sister; Stroke in his mother; Varicose Veins in his mother. There is no history of Colon cancer, Esophageal cancer, Rectal cancer, Stomach cancer, Alzheimer's disease, or Dementia. ROS:   Please see the history of present illness.    All 14 point review of systems negative except as described per history of present illness  EKGs/Labs/Other Studies Reviewed:         Recent Labs: 09/04/2023: Magnesium 1.9 12/20/2023:  NT-Pro BNP 2,585; TSH 2.720 04/22/2024: ALT 9 04/30/2024: B Natriuretic Peptide 466.3; BUN 15; Creatinine, Ser 0.89; Hemoglobin 15.1; Platelets 149; Potassium 4.7; Sodium 141  Recent Lipid Panel    Component Value Date/Time   CHOL 159 11/21/2018 0836   TRIG 82 11/21/2018 0836   HDL 59 11/21/2018 0836   CHOLHDL 2.7 11/21/2018 0836   LDLCALC 84 11/21/2018 0836    Physical Exam:    VS:  BP 134/80   Pulse 66   Ht 5' 8 (1.727 m)   Wt 166 lb 6.4 oz (75.5 kg)   SpO2 98%   BMI 25.30 kg/m     Wt Readings from Last 3 Encounters:  06/06/24 166 lb 6.4 oz (75.5 kg)  04/30/24 166 lb 11.2 oz (75.6 kg)  03/06/24 159 lb 3.2 oz (72.2 kg)     GEN:  Well nourished, well developed in no acute distress HEENT: Normal NECK: No JVD; No carotid bruits LYMPHATICS: No lymphadenopathy CARDIAC: RRR, no murmurs, no rubs, no gallops RESPIRATORY: Poor air entry right base ABDOMEN: Soft, non-tender, non-distended MUSCULOSKELETAL:  No edema; No deformity  SKIN: Warm and dry LOWER EXTREMITIES: no swelling NEUROLOGIC:  Alert and oriented x 3 PSYCHIATRIC:  Normal affect   ASSESSMENT:    1. Coronary artery disease involving native coronary artery of native heart without angina pectoris   2. 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 Paroxysmal atrial fibrillation (HCC)   4. Chronic heart failure with preserved ejection fraction (HCC)    PLAN:    In order of problems listed above:  Coronary disease stable from that point.  No signs and symptoms of reactivation of the problem. Congestive heart failure.  Asking to start taking Lasix  every single day with potassium it is only 20 mg.  Next week at the beginning I will check his Chem-7 and proBNP, and I will also see him within a week to see how he is responding to this approach.  In the meantime continue beta-blocker.  After stabilization I will ask him to have another echocardiogram. Permanent atrial fibrillation, he is anticoagulated,  asymptomatic doing well. Dyslipidemia on Crestor  40 which I will continue.   Medication Adjustments/Labs and Tests Ordered: Current medicines are reviewed at length with the patient today.  Concerns regarding medicines are outlined above.  No orders of the defined types were placed in this encounter.  Medication changes: No orders of the defined types were placed in this encounter.   Signed, Lamar DOROTHA Fitch, MD, [facility] 06/06/2024 8:34 AM    [facility]

## 2024-06-13 ENCOUNTER — Ambulatory Visit: Admitting: Cardiology

## 2024-06-18 ENCOUNTER — Encounter: Payer: Self-pay | Admitting: Cardiology

## 2024-06-18 ENCOUNTER — Ambulatory Visit: Payer: Self-pay | Admitting: Cardiology

## 2024-06-18 ENCOUNTER — Ambulatory Visit: Attending: Cardiology | Admitting: Cardiology

## 2024-06-18 VITALS — BP 152/94 | HR 80 | Ht 68.0 in | Wt 162.8 lb

## 2024-06-18 DIAGNOSIS — I5032 Chronic diastolic (congestive) heart failure: Secondary | ICD-10-CM | POA: Insufficient documentation

## 2024-06-18 DIAGNOSIS — Z950 Presence of cardiac pacemaker: Secondary | ICD-10-CM | POA: Diagnosis present

## 2024-06-18 DIAGNOSIS — I872 Venous insufficiency (chronic) (peripheral): Secondary | ICD-10-CM | POA: Insufficient documentation

## 2024-06-18 DIAGNOSIS — I480 Paroxysmal atrial fibrillation: Secondary | ICD-10-CM | POA: Insufficient documentation

## 2024-06-18 LAB — BASIC METABOLIC PANEL WITH GFR
BUN/Creatinine Ratio: 15 (ref 10–24)
BUN: 15 mg/dL (ref 8–27)
CO2: 24 mmol/L (ref 20–29)
Calcium: 8.9 mg/dL (ref 8.6–10.2)
Chloride: 104 mmol/L (ref 96–106)
Creatinine, Ser: 1 mg/dL (ref 0.76–1.27)
Glucose: 62 mg/dL — ABNORMAL LOW (ref 70–99)
Potassium: 4 mmol/L (ref 3.5–5.2)
Sodium: 141 mmol/L (ref 134–144)
eGFR: 74 mL/min/1.73 (ref >59)

## 2024-06-18 NOTE — Progress Notes (Unsigned)
 Cardiology Office Note:    Date:  06/18/2024   ID:  Louis Barton, DOB August 16, 1941, MRN [id number]  PCP:  Louis Barton., MD  Cardiologist:  Louis Fitch, MD    Referring MD: Louis Barton., MD   No chief complaint on file.   History of Present Illness:    Louis Barton is a 83 y.o. 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  Comes today to months for follow-up.  Overall medically doing fair.  He is again cheerful and happy today he is scheduled to have a sleep study done which I think is an excellent idea.  He described to have some heartburn as well as some hallucination before but now that seems to be subsided.  He said that he is to woman checking on him on the regular basis and he is looking forward to change his diet Coke he describes dizziness in the morning after he ate some sweets in the morning.  Hopefully he is right about changing diet to make him feel better.  Will wait and see  Past Medical History:  Diagnosis Date   Abnormal stress test    Acute encephalopathy 05/05/2021   Arteriosclerosis of coronary artery 11/16/2015   Formatting of this note might be different from the original.  Formatting of this note might be different from the original.  Formatting of this note might be different from the original.  On imaging;  Formatting of this note might be different from the original.  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  02/2021  Formatting of this note might be different from the original.  Formatting of this note might be different from the original.  02/2021     Chronic kidney disease    cysts on both kidneys, seeing DOROTHA Sharps in [place], [facility]    Coronary arteriosclerosis 11/16/2015   Formatting of this note might be different from the original. On imaging;   COVID 05/05/2021   COVID-19 virus infection 05/05/2021   Cramps of left lower extremity-Calf  > right calf 01/01/2015   Cyst, meninges, spinal 11/16/2015   Degeneration of lumbar intervertebral disc 11/16/2015   Formatting of this note might be different from the original. signficant DDD present with vacuum effect L4-5 and L5-S1 flat discs and anterior spurring noted   Dizziness 05/06/2020   Dyslipidemia 03/18/2015   Dyspnea on exertion 04/02/2020   Edema 11/16/2015   Edema of both lower extremities due to peripheral venous insufficiency 02/03/2020   Elevated tumor markers 12/15/2017   Erectile dysfunction 11/16/2015   First degree AV block 10/20/2020   Gait abnormality 04/19/2022   Heart block 05/05/2021   Heart failure (  HCC)    History of hiatal hernia    seen on last scan- as slight    History of thrombocytopenia    History of TIAs    Hypercoagulable state due to persistent atrial fibrillation (HCC) 08/11/2021   Hyperlipidemia    Hypertension    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 [place]. And Dr. Darlean Deal of this note might be different from the original.  Formatting of this  note might be different from the original.  Stage 1A. [place]. And Dr. Darlean Deal of this note might be different from the original.  Stage 1A. [place]. And Dr. Darlean Adenocarcinoma (non-small cell). Observation.     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  Formatting of this note might be different from the original.  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 Renal parapelvic cysts 10/06/16 on CT , right kidney midpole 1.2 cm, right kidney anterior pole 1.1 cm, and to the left kidney lower pole anterior position 3.5cm all felt to be benign   Right bundle branch block 10/20/2020   Right lower lobe pulmonary nodule 11/09/2017   Secondary hypercoagulable state 08/11/2021   Shingles    internal- obstruction of bowels & gallbladder   Sinus bradycardia 05/05/2021   Solitary pulmonary nodule 11/16/2016   CT ABd 03/24/09 linerar densities in RLL and RML c/w scarring o/w clear CT Abd 10/20/16 c/w 7 mm nodule  - CT chest 02/13/2017 :  slt enlarged to 9 mm   - PET 04/04/17 :1. These slowly enlarging 9 mm right lower lobe pulmonary nodule along the right hemidiaphragm does not appear hypermetabolic today. 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    Stage 3a chronic kidney  disease (HCC) 06/17/2020   Stroke (HCC)    Swelling of limb-Left foot 01/01/2015   Symptomatic bradycardia 03/18/2015   Thrombocytopenia 11/16/2015   Typical atrial flutter (HCC) 03/19/2021   Vitamin B12 deficiency 06/21/2017   Weakness of left lower extremity 09/21/2023    Past Surgical History:  Procedure Laterality Date   adenocarcinoma  2019   Removed   CARDIOVERSION N/A 03/22/2021   Procedure: CARDIOVERSION;  Surgeon: Pietro Redell RAMAN, MD;  Location: [facility] ENDOSCOPY;  Service: Cardiovascular;  Laterality: N/A;   CARDIOVERSION N/A 12/15/2021   Procedure: CARDIOVERSION;  Surgeon: Alvan Ronal BRAVO, MD;  Location: [facility] ENDOSCOPY;  Service: Cardiovascular;  Laterality: N/A;   CARDIOVERSION N/A 11/23/2023   Procedure: CARDIOVERSION;  Surgeon: Jeffrie Oneil BROCKS, MD;  Location: [facility] INVASIVE CV  LAB;  Service: Cardiovascular;  Laterality: N/A;   CAROTID ENDARTERECTOMY  2005   Left carotid by Dr. Harvey   CAROTID ENDARTERECTOMY  04/25/2011   Right Carotid by Dr. Harvey   COLONOSCOPY  02/23/2017   Colonic polyp status post polypectomy. Pancolonic diverticulosis predominatly in the sigmoid colon. Tubular adenoma   LEAD REVISION/REPAIR N/A 08/16/2021   Procedure: LEAD REVISION/REPAIR;  Surgeon: Inocencio Soyla Lunger, MD;  Location: [facility] INVASIVE CV LAB;  Service: Cardiovascular;  Laterality: N/A;   LEFT HEART CATH AND CORONARY ANGIOGRAPHY N/A 02/08/2021   Procedure: LEFT HEART CATH AND CORONARY ANGIOGRAPHY;  Surgeon: Verlin Lonni BIRCH, MD;  Location: [facility] INVASIVE CV LAB;  Service: Cardiovascular;  Laterality: N/A;   MOHS SURGERY     PACEMAKER IMPLANT N/A 07/28/2021   Procedure: PACEMAKER IMPLANT;  Surgeon: Inocencio Soyla Lunger, MD;  Location: [facility] INVASIVE CV LAB;  Service: Cardiovascular;  Laterality: N/A;   Septoplasty, nasal w/ submucosal resection  1960   SQUAMOUS CELL CARCINOMA EXCISION     pt said numerous times   TEE WITHOUT CARDIOVERSION N/A 03/22/2021   Procedure: TRANSESOPHAGEAL  ECHOCARDIOGRAM (TEE);  Surgeon: Pietro Redell RAMAN, MD;  Location: [facility] ENDOSCOPY;  Service: Cardiovascular;  Laterality: N/A;   TONSILLECTOMY     VIDEO ASSISTED THORACOSCOPY (VATS)/WEDGE RESECTION Right 11/09/2017   Procedure: RIGHT VIDEO ASSISTED THORACOSCOPY WITH MERVIN RESECTION;  Surgeon: Kerrin Elspeth BROCKS, MD;  Location: [facility] OR;  Service: Thoracic;  Laterality: Right;    Current Medications: Current Meds  Medication Sig   colesevelam  (WELCHOL ) 625 MG tablet Take 1,250 mg by mouth 2 (two) times daily with a meal.   furosemide  (LASIX ) 20 MG tablet Take 20 mg by mouth daily as needed for fluid or edema.   metoprolol  succinate (TOPROL -XL) 25 MG 24 hr tablet Take 12.5 mg by mouth daily.   Polyethyl Glycol-Propyl Glycol (SYSTANE) 0.4-0.3 % SOLN Place 1 drop into both eyes daily.   potassium chloride  (KLOR-CON  M) 10 MEQ tablet TAKE 1 TABLET (10 MEQ TOTAL) BY MOUTH DAILY AS NEEDED (TAKE ON DAYS LASIX  IS TAKEN).   rivaroxaban  (XARELTO ) 20 MG TABS tablet Take 1 tablet (20 mg total) by mouth daily with supper.   rosuvastatin  (CRESTOR ) 40 MG tablet Take 1 tablet (40 mg total) by mouth at bedtime.     Allergies:   Patient has no known allergies.   Social History   Socioeconomic History   Marital status: Widowed    Spouse name: Not on file   Number of children: 2   Years of education: Not on file   Highest education level: Not on file  Occupational History   Not on file  Tobacco Use   Smoking status: Former    Current packs/day: 0.00    Average packs/day: 1 pack/day for 16.0 years (16.0 ttl pk-yrs)    Types: Cigarettes    Start date: 09/26/1956    Quit date: 09/26/1972    Years since quitting: 51.7   Smokeless tobacco: Never   Tobacco comments:    Former smoker 08/11/21  Vaping Use   Vaping status: Never Used  Substance and Sexual Activity   Alcohol  use: Not Currently    Alcohol /week: 7.0 standard drinks of alcohol     Types: 7 Shots of liquor per week    Comment: 1 shot daily  08/11/21   Drug use: No   Sexual activity: Not Currently  Other Topics Concern   Not on file  Social History Narrative   Pt lives alone    Retired  Social Drivers of Corporate investment banker Strain: Not on file  Food Insecurity: Low Risk  (05/01/2024)   Received from [facility]   Hunger Vital Sign    Within the past 12 months, you worried that your food would run out before you got money to buy more: Never true    Within the past 12 months, the food you bought just didn't last and you didn't have money to get more. : Never true  Transportation Needs: No Transportation Needs (05/01/2024)   Received from [facility]    In the past 12 months, has lack of reliable transportation kept you from medical appointments, meetings, work or from getting things needed for daily living? : No  Physical Activity: Not on file  Stress: Not on file  Social Connections: Not on file     Family History: The patient's family history includes Cancer in his brother and sister; Cancer (age of onset: 9) in his mother; Deep vein thrombosis in his mother; Heart attack in his father and mother; Heart disease in his mother; Hyperlipidemia in his mother; Hypertension in his mother; Memory loss in his paternal grandfather; Other in his mother and sister; Stroke in his mother; Varicose Veins in his mother. There is no history of Colon cancer, Esophageal cancer, Rectal cancer, Stomach cancer, Alzheimer's disease, or Dementia. ROS:   Please see the history of present illness.    All 14 point review of systems negative except as described per history of present illness  EKGs/Labs/Other Studies Reviewed:         Recent Labs: 09/04/2023: Magnesium 1.9 12/20/2023: NT-Pro BNP 2,585; TSH 2.720 04/22/2024: ALT 9 04/30/2024: B Natriuretic Peptide 466.3; Hemoglobin 15.1; Platelets 149 06/17/2024: BUN 15; Creatinine, Ser 1.00; Potassium 4.0; Sodium 141  Recent Lipid Panel    Component Value Date/Time    CHOL 159 11/21/2018 0836   TRIG 82 11/21/2018 0836   HDL 59 11/21/2018 0836   CHOLHDL 2.7 11/21/2018 0836   LDLCALC 84 11/21/2018 0836    Physical Exam:    VS:  BP (!) 152/94   Pulse 80   Ht 5' 8 (1.727 m)   Wt 162 lb 12.8 oz (73.8 kg)   SpO2 95%   BMI 24.75 kg/m     Wt Readings from Last 3 Encounters:  06/18/24 162 lb 12.8 oz (73.8 kg)  06/06/24 166 lb 6.4 oz (75.5 kg)  04/30/24 166 lb 11.2 oz (75.6 kg)     GEN:  Well nourished, well developed in no acute distress HEENT: Normal NECK: No JVD; No carotid bruits LYMPHATICS: No lymphadenopathy CARDIAC: RRR, no murmurs, no rubs, no gallops RESPIRATORY:  Clear to auscultation without rales, wheezing or rhonchi  ABDOMEN: Soft, non-tender, non-distended MUSCULOSKELETAL:  No edema; No deformity  SKIN: Warm and dry LOWER EXTREMITIES: 2+ swelling NEUROLOGIC:  Alert and oriented x 3 PSYCHIATRIC:  Normal affect   ASSESSMENT:    1. Chronic heart failure with preserved ejection fraction (HCC)   2. Edema of both lower extremities due to peripheral venous insufficiency   3. Paroxysmal atrial fibrillation (HCC)   4. Pacemaker    PLAN:    In order of problems listed above:  Congestive heart failure.  Still swelling of lower extremities does not want to take any more Lasix .  Weight is absolutely stable he check it on the regular basis. Edema of lower extremities discussion about as above. Atrial fibrillation appears to be permanent, anticoagulated which I will continue. Pacemaker present  normal function   Medication Adjustments/Labs and Tests Ordered: Current medicines are reviewed at length with the patient today.  Concerns regarding medicines are outlined above.  No orders of the defined types were placed in this encounter.  Medication changes: No orders of the defined types were placed in this encounter.   Signed, Louis DOROTHA Fitch, MD, [facility] 06/18/2024 4:17 PM    [facility]

## 2024-06-18 NOTE — Patient Instructions (Signed)
Medication Instructions:  ?Your physician recommends that you continue on your current medications as directed. Please refer to the Current Medication list given to you today.  ?*If you need a refill on your cardiac medications before your next appointment, please call your pharmacy* ? ? ?Lab Work: ?None Ordered ?If you have labs (blood work) drawn today and your tests are completely normal, you will receive your results only by: ?MyChart Message (if you have MyChart) OR ?A paper copy in the mail ?If you have any lab test that is abnormal or we need to change your treatment, we will call you to review the results. ? ? ?Testing/Procedures: ?None Ordered ? ? ?Follow-Up: ?At [facility], you and your health needs are our priority.  As part of our continuing mission to provide you with exceptional heart care, we have created designated Provider Care Teams.  These Care Teams include your primary Cardiologist (physician) and Advanced Practice Providers (APPs -  Physician Assistants and Nurse Practitioners) who all work together to provide you with the care you need, when you need it. ? ?We recommend signing up for the patient portal called "MyChart".  Sign up information is provided on this After Visit Summary.  MyChart is used to connect with patients for Virtual Visits (Telemedicine).  Patients are able to view lab/test results, encounter notes, upcoming appointments, etc.  Non-urgent messages can be sent to your provider as well.   ?To learn more about what you can do with MyChart, go to [email or web address].   ? ?Your next appointment:   ?1 month(s) ? ?The format for your next appointment:   ?In Person ? ?Provider:   ?Robert Krasowski, MD  ? ? ?Other Instructions ?NA  ?

## 2024-06-20 ENCOUNTER — Other Ambulatory Visit: Payer: Self-pay | Admitting: Cardiology

## 2024-06-24 ENCOUNTER — Other Ambulatory Visit: Payer: Self-pay | Admitting: Cardiology

## 2024-06-24 MED ORDER — FUROSEMIDE 20 MG PO TABS: 20.0000 mg | ORAL_TABLET | Freq: Every day | ORAL | 2 refills | Status: DC

## 2024-06-26 NOTE — Progress Notes (Signed)
 Remote PPM Transmission

## 2024-07-18 ENCOUNTER — Encounter: Payer: Self-pay | Admitting: *Deleted

## 2024-07-18 ENCOUNTER — Encounter: Payer: Self-pay | Admitting: Cardiology

## 2024-07-18 ENCOUNTER — Ambulatory Visit: Attending: Cardiology | Admitting: Cardiology

## 2024-07-18 VITALS — BP 140/90 | HR 62 | Ht 68.0 in | Wt 171.0 lb

## 2024-07-18 DIAGNOSIS — I2510 Atherosclerotic heart disease of native coronary artery without angina pectoris: Secondary | ICD-10-CM | POA: Diagnosis present

## 2024-07-18 DIAGNOSIS — I10 Essential (primary) hypertension: Secondary | ICD-10-CM | POA: Diagnosis present

## 2024-07-18 DIAGNOSIS — I480 Paroxysmal atrial fibrillation: Secondary | ICD-10-CM | POA: Diagnosis present

## 2024-07-18 NOTE — Progress Notes (Unsigned)
 Cardiology Office Note:    Date:  07/18/2024   ID:  [name], DOB 1941/08/17, MRN [id number]  PCP:  Thurmond Cathlyn LABOR., MD  Cardiologist:  Lamar Fitch, MD    Referring MD: Thurmond Cathlyn LABOR., MD   Chief Complaint  Patient presents with   Follow-up    History of Present Illness:    Louis Barton is a 83 y.o. 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  Comes today to months with his daughter she participate in decision-making.  Overall he says he is doing quite okay sometimes good days sometimes bad days sometimes exhausted tired but no chest pain tightness squeezing pressure burning chest no palpitations  Past Medical History:  Diagnosis Date   Abnormal stress test    Acute encephalopathy 05/05/2021   Arteriosclerosis of coronary artery 11/16/2015   Formatting of this note might be different from the original.  Formatting of this note might be different from the original.  Formatting of this note might be different from the original.  On imaging;  Formatting of this note might be different from the original.  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  02/2021  Formatting of this note might be different from  the original.  Formatting of this note might be different from the original.  02/2021     Chronic kidney disease    cysts on both kidneys, seeing DOROTHA Sharps in [place], [facility]    Coronary arteriosclerosis 11/16/2015   Formatting of this note might be different from the original. On imaging;   COVID 05/05/2021   COVID-19 virus infection 05/05/2021   Cramps of left lower extremity-Calf  > right calf 01/01/2015   Cyst, meninges, spinal 11/16/2015   Degeneration of lumbar intervertebral disc 11/16/2015   Formatting of this note might be different from the original. 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 [place]. And Dr. Darlean Deal of this note might be different from the original.  Formatting of this note might be different from the original.  Stage 1A. [place]. And Dr. Darlean Deal of this note might be different from the original.  Stage 1A. [place]. And Dr. Darlean Adenocarcinoma (non-small cell). Observation.     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  Formatting of this note might be different from the original.  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 Renal parapelvic cysts 10/06/16 on CT , right kidney midpole 1.2 cm, right kidney anterior pole 1.1 cm, and to the left kidney lower pole anterior position 3.5cm all felt to be benign   Right bundle branch block 10/20/2020   Right lower lobe pulmonary nodule 11/09/2017   Secondary hypercoagulable state 08/11/2021   Shingles    internal- obstruction of bowels & gallbladder   Sinus bradycardia 05/05/2021   Solitary pulmonary nodule 11/16/2016   CT ABd 03/24/09 linerar densities in RLL and RML c/w scarring o/w clear CT Abd 10/20/16 c/w 7 mm nodule  - CT chest 02/13/2017 :  slt enlarged to 9 mm   - PET 04/04/17 :1. These slowly enlarging 9 mm right lower lobe pulmonary nodule along the right hemidiaphragm does not appear hypermetabolic today. 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   Stroke (HCC)    Swelling of limb-Left foot 01/01/2015   Symptomatic bradycardia 03/18/2015   Thrombocytopenia 11/16/2015   Typical atrial flutter (HCC) 03/19/2021   Vitamin B12 deficiency 06/21/2017   Weakness of left lower extremity 09/21/2023    Past Surgical  History:  Procedure Laterality Date   adenocarcinoma  2019   Removed   CARDIOVERSION N/A 03/22/2021   Procedure: CARDIOVERSION;  Surgeon: Pietro Redell RAMAN, MD;  Location: [facility] ENDOSCOPY;  Service: Cardiovascular;  Laterality: N/A;   CARDIOVERSION N/A 12/15/2021   Procedure: CARDIOVERSION;  Surgeon: Alvan Ronal BRAVO, MD;  Location: [facility] ENDOSCOPY;  Service: Cardiovascular;  Laterality: N/A;   CARDIOVERSION N/A 11/23/2023   Procedure: CARDIOVERSION;  Surgeon: Jeffrie Oneil BROCKS, MD;  Location: [facility] INVASIVE CV LAB;  Service: Cardiovascular;  Laterality: N/A;   CAROTID ENDARTERECTOMY  2005   Left carotid by Dr. Harvey   CAROTID ENDARTERECTOMY  04/25/2011   Right Carotid by Dr. Harvey   COLONOSCOPY  02/23/2017   Colonic polyp status post polypectomy. Pancolonic diverticulosis predominatly in the sigmoid colon. Tubular adenoma   LEAD REVISION/REPAIR N/A 08/16/2021   Procedure: LEAD REVISION/REPAIR;  Surgeon:  Inocencio Soyla Lunger, MD;  Location: [facility] INVASIVE CV LAB;  Service: Cardiovascular;  Laterality: N/A;   LEFT HEART CATH AND CORONARY ANGIOGRAPHY N/A 02/08/2021   Procedure: LEFT HEART CATH AND CORONARY ANGIOGRAPHY;  Surgeon: Verlin Lonni BIRCH, MD;  Location: [facility] INVASIVE CV LAB;  Service: Cardiovascular;  Laterality: N/A;   MOHS SURGERY     PACEMAKER IMPLANT N/A 07/28/2021   Procedure: PACEMAKER IMPLANT;  Surgeon: Inocencio Soyla Lunger, MD;  Location: [facility] INVASIVE CV LAB;  Service: Cardiovascular;  Laterality: N/A;   Septoplasty, nasal w/ submucosal resection  1960   SQUAMOUS CELL CARCINOMA EXCISION     pt said numerous times   TEE WITHOUT CARDIOVERSION N/A 03/22/2021   Procedure: TRANSESOPHAGEAL ECHOCARDIOGRAM (TEE);  Surgeon: Pietro Redell RAMAN, MD;  Location: [facility] ENDOSCOPY;  Service: Cardiovascular;  Laterality: N/A;   TONSILLECTOMY     VIDEO ASSISTED THORACOSCOPY (VATS)/WEDGE RESECTION Right 11/09/2017   Procedure: RIGHT VIDEO ASSISTED THORACOSCOPY WITH MERVIN RESECTION;  Surgeon: Kerrin Elspeth BROCKS, MD;  Location: [facility] OR;  Service: Thoracic;  Laterality: Right;    Current Medications: Current Meds  Medication Sig   colesevelam  (WELCHOL ) 625 MG tablet Take 1,250 mg by mouth 2 (two) times daily with a meal.   furosemide  (LASIX ) 20 MG tablet Take 1 tablet (20 mg total) by mouth daily.   metoprolol  succinate (TOPROL -XL) 25 MG 24 hr tablet Take 12.5 mg by mouth daily.   Polyethyl Glycol-Propyl Glycol (SYSTANE) 0.4-0.3 % SOLN Place 1 drop into both eyes daily.   potassium chloride  (KLOR-CON  M) 10 MEQ tablet TAKE 1 TABLET (10 MEQ TOTAL) BY MOUTH DAILY AS NEEDED (TAKE ON DAYS LASIX  IS TAKEN).   rivaroxaban  (XARELTO ) 20 MG TABS tablet Take 1 tablet (20 mg total) by mouth daily with supper.   rosuvastatin  (CRESTOR ) 40 MG tablet Take 1 tablet (40 mg total) by mouth at bedtime.   TRELEGY ELLIPTA 100-62.5-25 MCG/ACT AEPB Inhale 1 puff into the lungs daily.     Allergies:   Patient has no known allergies.   Social History   Socioeconomic History   Marital status: Widowed    Spouse name: Not on file   Number of children: 2   Years of education: Not on file   Highest education level: Not on file  Occupational History   Not on file  Tobacco Use   Smoking status: Former    Current packs/day: 0.00    Average packs/day: 1 pack/day for 16.0 years (16.0 ttl pk-yrs)    Types: Cigarettes    Start date: 09/26/1956    Quit date: 09/26/1972    Years since quitting: 51.8   Smokeless tobacco: Never   Tobacco comments:    Former smoker 08/11/21  Vaping Use   Vaping status: Never Used  Substance and Sexual Activity   Alcohol  use: Not Currently    Alcohol /week: 7.0 standard drinks of alcohol     Types: 7 Shots of liquor per week    Comment: 1 shot daily 08/11/21   Drug use: No   Sexual activity: Not Currently  Other Topics Concern   Not on file  Social History Narrative   Pt lives alone    Retired    Social Drivers of Corporate investment banker Strain: Not on file  Food Insecurity:  Low Risk  (05/01/2024)   Received from [facility]   Hunger Vital Sign    Within the past 12 months, you worried that your food would run out before you got money to buy more:  Never true    Within the past 12 months, the food you bought just didn't last and you didn't have money to get more. : Never true  Transportation Needs: No Transportation Needs (05/01/2024)   Received from [facility]    In the past 12 months, has lack of reliable transportation kept you from medical appointments, meetings, work or from getting things needed for daily living? : No  Physical Activity: Not on file  Stress: Not on file  Social Connections: Not on file     Family History: The patient's family history includes Cancer in his brother and sister; Cancer (age of onset: 53) in his mother; Deep vein thrombosis in his mother; Heart attack in his father and mother; Heart disease in his mother; Hyperlipidemia in his mother; Hypertension in his mother; Memory loss in his paternal grandfather; Other in his mother and sister; Stroke in his mother; Varicose Veins in his mother. There is no history of Colon cancer, Esophageal cancer, Rectal cancer, Stomach cancer, Alzheimer's disease, or Dementia. ROS:   Please see the history of present illness.    All 14 point review of systems negative except as described per history of present illness  EKGs/Labs/Other Studies Reviewed:         Recent Labs: 09/04/2023: Magnesium 1.9 12/20/2023: NT-Pro BNP 2,585; TSH 2.720 04/22/2024: ALT 9 04/30/2024: B Natriuretic Peptide 466.3; Hemoglobin 15.1; Platelets 149 06/17/2024: BUN 15; Creatinine, Ser 1.00; Potassium 4.0; Sodium 141  Recent Lipid Panel    Component Value Date/Time   CHOL 159 11/21/2018 0836   TRIG 82 11/21/2018 0836   HDL 59 11/21/2018 0836   CHOLHDL 2.7 11/21/2018 0836   LDLCALC 84 11/21/2018 0836    Physical Exam:    VS:  BP (!) 140/90   Pulse 62   Ht 5' 8 (1.727 m)   Wt 171 lb (77.6 kg)    SpO2 99%   BMI 26.00 kg/m     Wt Readings from Last 3 Encounters:  07/18/24 171 lb (77.6 kg)  06/18/24 162 lb 12.8 oz (73.8 kg)  06/06/24 166 lb 6.4 oz (75.5 kg)     GEN:  Well nourished, well developed in no acute distress HEENT: Normal NECK: No JVD; No carotid bruits LYMPHATICS: No lymphadenopathy CARDIAC: RRR, no murmurs, no rubs, no gallops RESPIRATORY:  Clear to auscultation without rales, wheezing or rhonchi  ABDOMEN: Soft, non-tender, non-distended MUSCULOSKELETAL:  No edema; No deformity  SKIN: Warm and dry LOWER EXTREMITIES: no swelling NEUROLOGIC:  Alert and oriented x 3 PSYCHIATRIC:  Normal affect   ASSESSMENT:    1. Coronary artery disease involving native coronary artery of native heart without angina pectoris   2. Essential hypertension   3. Paroxysmal atrial fibrillation (HCC)    PLAN:    In order of problems listed above:  Coronary disease stable on guideline directed medical therapy which we will continue. Essential hypertension blood pressure controlled continue present management. Swelling of lower extremities he is on Lasix  20 mg daily only.  Will continue that management. Paroxysmal atrial fibrillation, anticoagulated, continue monitoring with pacemaker.   Medication Adjustments/Labs and Tests Ordered: Current medicines are reviewed at length with the patient today.  Concerns regarding medicines are outlined above.  No orders of the defined types were placed in this encounter.  Medication changes: No orders of the defined types were placed in this encounter.   Signed, Lamar DOROTHA Fitch, MD, [facility] 07/18/2024 3:57 PM    [facility]

## 2024-07-18 NOTE — Patient Instructions (Signed)
Medication Instructions:  Your physician recommends that you continue on your current medications as directed. Please refer to the Current Medication list given to you today.  *If you need a refill on your cardiac medications before your next appointment, please call your pharmacy*   Lab Work: None ordered If you have labs (blood work) drawn today and your tests are completely normal, you will receive your results only by: [place] (if you have MyChart) OR A paper copy in the mail If you have any lab test that is abnormal or we need to change your treatment, we will call you to review the results.   Testing/Procedures: None ordered   Follow-Up: At [facility], you and your health needs are our priority.  As part of our continuing mission to provide you with exceptional heart care, we have created designated Provider Care Teams.  These Care Teams include your primary Cardiologist (physician) and Advanced Practice Providers (APPs -  Physician Assistants and Nurse Practitioners) who all work together to provide you with the care you need, when you need it.  We recommend signing up for the patient portal called "MyChart".  Sign up information is provided on this After Visit Summary.  MyChart is used to connect with patients for Virtual Visits (Telemedicine).  Patients are able to view lab/test results, encounter notes, upcoming appointments, etc.  Non-urgent messages can be sent to your provider as well.   To learn more about what you can do with MyChart, go to [email or web address].    Your next appointment:   3 month(s)  The format for your next appointment:   In Person  Provider:   Jenne Campus, MD    Other Instructions none  Important Information About Sugar

## 2024-07-25 ENCOUNTER — Ambulatory Visit

## 2024-07-25 ENCOUNTER — Ambulatory Visit: Attending: Cardiology

## 2024-07-25 DIAGNOSIS — I480 Paroxysmal atrial fibrillation: Secondary | ICD-10-CM | POA: Diagnosis not present

## 2024-07-26 ENCOUNTER — Ambulatory Visit: Payer: Self-pay | Admitting: Cardiology

## 2024-07-26 LAB — CUP PACEART REMOTE DEVICE CHECK
Battery Remaining Longevity: 108 mo
Battery Voltage: 2.99 V
Brady Statistic RA Percent Paced: 0.13 %
Brady Statistic RV Percent Paced: 99.55 %
Date Time Interrogation Session: 20251030061213
Implantable Lead Connection Status: 753985
Implantable Lead Connection Status: 753985
Implantable Lead Implant Date: 20221102
Implantable Lead Implant Date: 20221121
Implantable Lead Location: 753859
Implantable Lead Location: 753860
Implantable Lead Model: 3830
Implantable Lead Model: 5076
Implantable Pulse Generator Implant Date: 20221102
Lead Channel Impedance Value: 266 Ohm
Lead Channel Impedance Value: 285 Ohm
Lead Channel Impedance Value: 380 Ohm
Lead Channel Impedance Value: 437 Ohm
Lead Channel Pacing Threshold Amplitude: 0.625 V
Lead Channel Pacing Threshold Amplitude: 0.75 V
Lead Channel Pacing Threshold Pulse Width: 0.4 ms
Lead Channel Pacing Threshold Pulse Width: 0.4 ms
Lead Channel Sensing Intrinsic Amplitude: 0.875 mV
Lead Channel Sensing Intrinsic Amplitude: 0.875 mV
Lead Channel Sensing Intrinsic Amplitude: 6 mV
Lead Channel Sensing Intrinsic Amplitude: 6 mV
Lead Channel Setting Pacing Amplitude: 1.5 V
Lead Channel Setting Pacing Amplitude: 2 V
Lead Channel Setting Pacing Pulse Width: 0.4 ms
Lead Channel Setting Sensing Sensitivity: 0.9 mV
Zone Setting Status: 755011
Zone Setting Status: 755011

## 2024-07-30 DIAGNOSIS — G4730 Sleep apnea, unspecified: Secondary | ICD-10-CM | POA: Insufficient documentation

## 2024-07-31 NOTE — Progress Notes (Signed)
 Remote PPM Transmission

## 2024-08-12 ENCOUNTER — Telehealth: Payer: Self-pay | Admitting: Neurology

## 2024-08-12 NOTE — Telephone Encounter (Signed)
 MYC conf

## 2024-08-13 ENCOUNTER — Ambulatory Visit: Admitting: Neurology

## 2024-08-16 ENCOUNTER — Telehealth: Payer: Self-pay

## 2024-08-16 ENCOUNTER — Encounter: Payer: Self-pay | Admitting: Gastroenterology

## 2024-08-16 ENCOUNTER — Ambulatory Visit (INDEPENDENT_AMBULATORY_CARE_PROVIDER_SITE_OTHER): Admitting: Gastroenterology

## 2024-08-16 VITALS — BP 124/64 | HR 90 | Ht 68.0 in | Wt 167.0 lb

## 2024-08-16 DIAGNOSIS — R1310 Dysphagia, unspecified: Secondary | ICD-10-CM

## 2024-08-16 DIAGNOSIS — K449 Diaphragmatic hernia without obstruction or gangrene: Secondary | ICD-10-CM | POA: Diagnosis not present

## 2024-08-16 DIAGNOSIS — K219 Gastro-esophageal reflux disease without esophagitis: Secondary | ICD-10-CM | POA: Diagnosis not present

## 2024-08-16 MED ORDER — OMEPRAZOLE 20 MG PO CPDR
20.0000 mg | DELAYED_RELEASE_CAPSULE | Freq: Every day | ORAL | 3 refills | Status: AC
Start: 2024-08-16 — End: ?

## 2024-08-16 NOTE — Patient Instructions (Signed)
 _______________________________________________________  If your blood pressure at your visit was 140/90 or greater, please contact your primary care physician to follow up on this.  _______________________________________________________  If you are age 85 or older, your body mass index should be between 23-30. Your Body mass index is 25.39 kg/m. If this is out of the aforementioned range listed, please consider follow up with your Primary Care Provider.  If you are age 20 or younger, your body mass index should be between 19-25. Your Body mass index is 25.39 kg/m. If this is out of the aformentioned range listed, please consider follow up with your Primary Care Provider.   ________________________________________________________  The [facility] providers would like to encourage you to use MYCHART to communicate with providers for non-urgent requests or questions.  Due to long hold times on the telephone, sending your provider a message by [facility] may be a faster and more efficient way to get a response.  Please allow 48 business hours for a response.  Please remember that this is for non-urgent requests.  _______________________________________________________  [facility] is using a team-based approach to care.  Your team is made up of your doctor and two to three APPS. Our APPS (Nurse Practitioners and Physician Assistants) work with your physician to ensure care continuity for you. They are fully qualified to address your health concerns and develop a treatment plan. They communicate directly with your gastroenterologist to care for you. Seeing the Advanced Practice Practitioners on your physician's team can help you by facilitating care more promptly, often allowing for earlier appointments, access to diagnostic testing, procedures, and other specialty referrals.   We have sent the following medications to your pharmacy for you to pick up at your  convenience: Omperazole  Please purchase the following medications over the counter and take as directed: Magnesium 200mg   You will be contacted by our office prior to your procedure for directions on holding your Xarelto .  If you do not hear from our office 2 week prior to your scheduled procedure, please call [phone] to discuss.   You have been scheduled for a Barium Esophogram at [facility] (1st floor of the hospital) on 09-04-24 at 11am. Please arrive 30 minutes prior to your appointment for registration. Make certain not to have anything to eat or drink 3 hours prior to your test. If you need to reschedule for any reason, please contact radiology at [phone] to do so. __________________________________________________________________ A barium swallow is an examination that concentrates on views of the esophagus. This tends to be a double contrast exam (barium and two liquids which, when combined, create a gas to distend the wall of the oesophagus) or single contrast (non-ionic iodine based). The study is usually tailored to your symptoms so a good history is essential. Attention is paid during the study to the form, structure and configuration of the esophagus, looking for functional disorders (such as aspiration, dysphagia, achalasia, motility and reflux) EXAMINATION You may be asked to change into a gown, depending on the type of swallow being performed. A radiologist and radiographer will perform the procedure. The radiologist will advise you of the type of contrast selected for your procedure and direct you during the exam. You will be asked to stand, sit or lie in several different positions and to hold a small amount of fluid in your mouth before being asked to swallow while the imaging is performed .In some instances you may be asked to swallow barium coated marshmallows to assess the motility of  a solid food bolus. The exam can be recorded as a digital or video  fluoroscopy procedure. POST PROCEDURE It will take 1-2 days for the barium to pass through your system. To facilitate this, it is important, unless otherwise directed, to increase your fluids for the next 24-48hrs and to resume your normal diet.  This test typically takes about 30 minutes to perform. __________________________________________________________________________________   Louis Barton have been scheduled for an endoscopy. Please follow written instructions given to you at your visit today.  If you use inhalers (even only as needed), please bring them with you on the day of your procedure.  If you take any of the following medications, they will need to be adjusted prior to your procedure:   DO NOT TAKE 7 DAYS PRIOR TO TEST- Trulicity (dulaglutide) Ozempic, Wegovy (semaglutide) Mounjaro, Zepbound (tirzepatide) Bydureon Bcise (exanatide extended release)  DO NOT TAKE 1 DAY PRIOR TO YOUR TEST Rybelsus (semaglutide) Adlyxin (lixisenatide) Victoza (liraglutide) Byetta (exanatide) ___________________________________________________________________________   Thank you,  Dr. Lynnie Bring

## 2024-08-16 NOTE — Telephone Encounter (Signed)
 Dr. Bernie,  You saw this patient on 07/18/2024. Per protocol we request that you comment on his cardiac risk to proceed with EDG and dilation since it has been less than 2 months since evaluated in the office. I have reached out to pharmacy for Xarelto  hold duration. Please send your comment to P CV Pre-Op Pool.  Thank you, Lamarr Satterfield DNP, ANP, AACC.

## 2024-08-16 NOTE — Telephone Encounter (Signed)
 Pharmacy please advise on holding rivaroxaban  for 2 days prior to EDG with dilation scheduled for 10/15/2024. Thank you.

## 2024-08-16 NOTE — Progress Notes (Signed)
 Chief Complaint: dysphagia  Referring Provider:  Thurmond Cathlyn LABOR., MD, Dr Cornelius      ASSESSMENT AND PLAN;   #1. GERD with HH/dysphagia #2. A Fib on Xarelto  #3. 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 Hypomagnesemia with Nl BUN/Cr  Plan: -Ba Swallow with Ba tab. -EGD with dil (Jan 2026) -Omeprazole  20 mg p.o. daily #90, 4RF -Magnesiun 200 every day. Will help with constipation -Cardiac clearence prior to hold Xeralto 2 days prior and for EGD.  HPI:    Louis Barton is a 83 y.o. male  With A Fib on Xarelto , AV block s/p pacemaker placement, CAD, HTN, OSA, HFpEF (EF 55-60%), H/O Lung cancer (stage Ia) s/p right VATS basilar segmentectomy 10/2017 (No XRT) and other medical problems as listed below.  Discussed the use of AI scribe software for clinical note transcription with the patient, who gave verbal consent to proceed.  History of Present Illness Louis Barton is an 83 year old male with a history of hiatal hernia who presents with dysphagia.  He experiences dysphagia, particularly with solid foods like meat, where food gets stuck in his esophagus. He manages this by using soft foods or bread to help push the food down. The issue is intermittent and seems to depend on his diet. No need for manual removal of food and no heartburn is reported.  He has a known hiatal hernia and was previously on stomach medication, which he is not currently taking. He does not recall why the medication was stopped. He experiences regurgitation when bending over.  He has a history of lung cancer, having had the bottom third of his right lung removed. He did not undergo radiation therapy.  He is awaiting a CPAP machine for sleep apnea and reports waking up 34 times during a sleep study. He does not currently use oxygen at home.  He has a history of congestive heart failure and is on Xarelto . He also has a pacemaker due to previous heart block and slow heart rate.  He has been experiencing constipation for  the past two weeks, which he attributes to Lasix  use. He previously used a stool softener but stopped for unknown reasons. He reports a recent change in urine color and cloudiness. His magnesium levels were noted to be low, and he does not currently take magnesium supplements.      Past GI procedures: EGD 06/2018 - 4 cm hiatal hernia. - Gastritis. Biopsied. - Incidental duodenal lipoma.  Colonoscopy 02/2022: Neg - Lipomatous ileocecal valve. Biopsied. - Diverticulosis in the sigmoid colon. - Non- bleeding internal hemorrhoids. - The examined portion of the ileum was normal. - The examination was otherwise normal on direct and retroflexion views. No need to rpt d/t age. -Colonoscopy 06/2018: small polyp s/p polypectomy- 02/23/2017: (PCF)2 tubular adenomas, moderate sigmoid diverticulosis. 01/2014: Tubular adenomas. 09/2008: Colon polyps. Past Medical History:  Diagnosis Date   Abnormal stress test    Acute encephalopathy 05/05/2021   Arteriosclerosis of coronary artery 11/16/2015   Formatting of this note might be different from the original.  Formatting of this note might be different from the original.  Formatting of this note might be different from the original.  On imaging;  Formatting of this note might be different from the original.  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  02/2021  Formatting of this note might be different from the original.  Formatting of this note might be different  from the original.  02/2021     Chronic kidney disease    cysts on both kidneys, seeing DOROTHA Sharps in [place], [facility]    Coronary arteriosclerosis 11/16/2015   Formatting of this note might be different from the original. On imaging;   COVID 05/05/2021   COVID-19 virus infection 05/05/2021   Cramps of left lower extremity-Calf  > right calf 01/01/2015   Cyst, meninges, spinal 11/16/2015   Degeneration of lumbar intervertebral disc 11/16/2015   Formatting of this note might be different from the original. signficant DDD present with vacuum effect L4-5 and L5-S1 flat discs and anterior spurring noted   Dizziness 05/06/2020   Dyslipidemia 03/18/2015   Dyspnea on exertion 04/02/2020   Edema 11/16/2015   Edema of both lower extremities due to peripheral venous insufficiency 02/03/2020   Elevated tumor markers 12/15/2017   Erectile dysfunction 11/16/2015   First degree AV block 10/20/2020   Gait abnormality 04/19/2022   Heart block 05/05/2021   Heart failure (HCC)    History of hiatal hernia    seen on last scan- as slight    History of thrombocytopenia    History of TIAs    Hypercoagulable state due to persistent atrial fibrillation (HCC) 08/11/2021   Hyperlipidemia    Hypertension    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 [place]. And Dr. Darlean Deal of this note might be different from the original.  Formatting of this note might be different from the original.  Stage 1A. [place]. And Dr. Darlean Deal of this note might be different from the original.  Stage 1A. [place]. And Dr. Darlean Adenocarcinoma (non-small cell). Observation.     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  Formatting of this note might be different from the original.  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 Renal parapelvic cysts 10/06/16 on CT , right kidney midpole 1.2 cm, right kidney anterior pole 1.1 cm, and to the left kidney lower pole anterior position 3.5cm all felt to be benign   Right bundle branch block 10/20/2020   Right lower lobe pulmonary nodule 11/09/2017   Secondary hypercoagulable state 08/11/2021   Shingles    internal- obstruction of  bowels & gallbladder   Sinus bradycardia 05/05/2021   Solitary pulmonary nodule 11/16/2016   CT ABd 03/24/09 linerar densities in RLL and RML c/w scarring o/w clear CT Abd 10/20/16 c/w 7 mm nodule  - CT chest 02/13/2017 :  slt enlarged to 9 mm   - PET 04/04/17 :1. These slowly enlarging 9 mm right lower lobe pulmonary nodule along the right hemidiaphragm does not appear hypermetabolic today. 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   Stroke (HCC)    Swelling of limb-Left foot 01/01/2015   Symptomatic bradycardia 03/18/2015   Thrombocytopenia 11/16/2015   Typical atrial flutter (HCC) 03/19/2021   Vitamin B12 deficiency 06/21/2017   Weakness of left lower extremity 09/21/2023    Past Surgical History:  Procedure Laterality Date   adenocarcinoma   2019   Removed   CARDIOVERSION N/A 03/22/2021   Procedure: CARDIOVERSION;  Surgeon: Pietro Redell RAMAN, MD;  Location: [facility] ENDOSCOPY;  Service: Cardiovascular;  Laterality: N/A;   CARDIOVERSION N/A 12/15/2021   Procedure: CARDIOVERSION;  Surgeon: Alvan Ronal BRAVO, MD;  Location: [facility] ENDOSCOPY;  Service: Cardiovascular;  Laterality: N/A;   CARDIOVERSION N/A 11/23/2023   Procedure: CARDIOVERSION;  Surgeon: Jeffrie Oneil BROCKS, MD;  Location: [facility] INVASIVE CV LAB;  Service: Cardiovascular;  Laterality: N/A;   CAROTID ENDARTERECTOMY  2005   Left carotid by Dr. Harvey   CAROTID ENDARTERECTOMY  04/25/2011   Right Carotid by Dr. Harvey   COLONOSCOPY  02/23/2017   Colonic polyp status post polypectomy. Pancolonic diverticulosis predominatly in the sigmoid colon. Tubular adenoma   LEAD REVISION/REPAIR N/A 08/16/2021   Procedure: LEAD REVISION/REPAIR;  Surgeon: Inocencio Soyla Lunger, MD;  Location: [facility] INVASIVE CV LAB;  Service: Cardiovascular;  Laterality: N/A;   LEFT HEART CATH AND CORONARY ANGIOGRAPHY N/A 02/08/2021   Procedure: LEFT HEART CATH AND CORONARY ANGIOGRAPHY;  Surgeon: Verlin Lonni BIRCH, MD;  Location: [facility] INVASIVE CV LAB;  Service: Cardiovascular;  Laterality: N/A;   MOHS SURGERY     PACEMAKER IMPLANT N/A 07/28/2021   Procedure: PACEMAKER IMPLANT;  Surgeon: Inocencio Soyla Lunger, MD;  Location: [facility] INVASIVE CV LAB;  Service: Cardiovascular;  Laterality: N/A;   Septoplasty, nasal w/ submucosal resection  1960   SQUAMOUS CELL CARCINOMA EXCISION     pt said numerous times   TEE WITHOUT CARDIOVERSION N/A 03/22/2021   Procedure: TRANSESOPHAGEAL ECHOCARDIOGRAM (TEE);  Surgeon: Pietro Redell RAMAN, MD;  Location: [facility] ENDOSCOPY;  Service: Cardiovascular;  Laterality: N/A;   TONSILLECTOMY     VIDEO ASSISTED THORACOSCOPY (VATS)/WEDGE RESECTION Right 11/09/2017   Procedure: RIGHT VIDEO ASSISTED THORACOSCOPY WITH MERVIN RESECTION;  Surgeon: Kerrin Elspeth BROCKS, MD;  Location: [facility] OR;  Service: Thoracic;   Laterality: Right;    Family History  Problem Relation Age of Onset   Cancer Mother 19       Breast   Stroke Mother    Hyperlipidemia Mother    Other Mother        varicose veins   Heart attack Mother    Heart disease Mother        Left ankle swelling   Hypertension Mother    Deep vein thrombosis Mother    Varicose Veins Mother    Heart attack Father    Other Sister  Tumor in Lung and Brain   Cancer Sister        Tumor   Lung  and  Brain   Cancer Brother        BCC-SCC-Merkle Cell   Memory loss Paternal Grandfather    Colon cancer Neg Hx    Esophageal cancer Neg Hx    Rectal cancer Neg Hx    Stomach cancer Neg Hx    Alzheimer's disease Neg Hx    Dementia Neg Hx     Social History   Tobacco Use   Smoking status: Former    Current packs/day: 0.00    Average packs/day: 1 pack/day for 16.0 years (16.0 ttl pk-yrs)    Types: Cigarettes    Start date: 09/26/1956    Quit date: 09/26/1972    Years since quitting: 51.9   Smokeless tobacco: Never   Tobacco comments:    Former smoker 08/11/21  Vaping Use   Vaping status: Never Used  Substance Use Topics   Alcohol  use: Not Currently    Alcohol /week: 7.0 standard drinks of alcohol     Types: 7 Shots of liquor per week    Comment: 1 shot daily 08/11/21   Drug use: No    Current Outpatient Medications  Medication Sig Dispense Refill   colesevelam  (WELCHOL ) 625 MG tablet Take 1,250 mg by mouth 2 (two) times daily with a meal.     furosemide  (LASIX ) 20 MG tablet Take 1 tablet (20 mg total) by mouth daily. 90 tablet 2   metoprolol  succinate (TOPROL -XL) 25 MG 24 hr tablet Take 12.5 mg by mouth daily.     Polyethyl Glycol-Propyl Glycol (SYSTANE) 0.4-0.3 % SOLN Place 1 drop into both eyes daily.     potassium chloride  (KLOR-CON  M) 10 MEQ tablet TAKE 1 TABLET (10 MEQ TOTAL) BY MOUTH DAILY AS NEEDED (TAKE ON DAYS LASIX  IS TAKEN). 90 tablet 1   rivaroxaban  (XARELTO ) 20 MG TABS tablet Take 1 tablet (20 mg total) by mouth daily  with supper. 30 tablet 5   rosuvastatin  (CRESTOR ) 40 MG tablet Take 1 tablet (40 mg total) by mouth at bedtime.     TRELEGY ELLIPTA 100-62.5-25 MCG/ACT AEPB Inhale 1 puff into the lungs daily.     No current facility-administered medications for this visit.    No Known Allergies  Review of Systems:  neg     Physical Exam:    BP 124/64   Pulse 90   Ht 5' 8 (1.727 m)   Wt 167 lb (75.8 kg)   BMI 25.39 kg/m  Filed Weights   08/16/24 1336  Weight: 167 lb (75.8 kg)   Constitutional:  Well-developed, in no acute distress. Psychiatric: Normal mood and affect. Behavior is normal. HEENT: Pupils normal.  Conjunctivae are normal. No scleral icterus. Neck supple.  Cardiovascular: Normal rate, regular rhythm. No edema Pulmonary/chest: Effort normal and breath sounds normal. No wheezing, rales or rhonchi. Abdominal: Soft, nondistended. Nontender. Bowel sounds active throughout. There are no masses palpable. No hepatomegaly. Rectal:  defered Neurological: Alert and oriented to person place and time. Skin: Skin is warm and dry. No rashes noted.  Data Reviewed: I have personally reviewed following labs and imaging studies  CBC:    Latest Ref Rng & Units 04/30/2024    2:26 PM 04/22/2024   10:35 AM 02/01/2024    9:59 AM  CBC  WBC 4.0 - 10.5 K/uL 6.9  6.2  7.6   Hemoglobin 13.0 - 17.0 g/dL 84.8  85.8  84.1  Hematocrit 39.0 - 52.0 % 47.4  43.4  47.1   Platelets 150 - 400 K/uL 149  118  141     CMP:    Latest Ref Rng & Units 06/17/2024    3:12 PM 04/30/2024    2:26 PM 04/22/2024   10:35 AM  CMP  Glucose 70 - 99 mg/dL 62  86  95   BUN 8 - 27 mg/dL 15  15  17    Creatinine 0.76 - 1.27 mg/dL 8.99  9.10  9.00   Sodium 134 - 144 mmol/L 141  141  142   Potassium 3.5 - 5.2 mmol/L 4.0  4.7  4.1   Chloride 96 - 106 mmol/L 104  106  105   CO2 20 - 29 mmol/L 24  25  27    Calcium  8.6 - 10.2 mg/dL 8.9  8.9  8.9   Total Protein 6.5 - 8.1 g/dL   5.8   Total Bilirubin 0.0 - 1.2 mg/dL   0.7    Alkaline Phos 38 - 126 U/L   57   AST 15 - 41 U/L   22   ALT 0 - 44 U/L   9    CT 04/30/2018 chest abdomen and pelvis -Surgical changes right lower lobe with moderate sized hydropneumothorax suspicious for small bronchopleural fistula -Moderate hiatal hernia    Anselm Bring, MD 08/16/2024, 1:51 PM  Cc: Thurmond Cathlyn LABOR., MD  Dr. Cornelius

## 2024-08-16 NOTE — Telephone Encounter (Addendum)
 [facility] Pre-operative Risk Assessment     Request for surgical clearance:     Endoscopy Procedure  What type of surgery is being performed?     EGD with Dil  When is this surgery scheduled?     10-15-24  What type of clearance is required ?   Medical/Pharmacy  Are there any medications that need to be held prior to surgery and how long? If patient is cleared to have a procedure and if he can hold it 2 days prior  Practice name and name of physician performing surgery?      [facility]  What is your office phone and fax number?      Phone- [phone]  Fax- [phone]  Anesthesia type (None, local, MAC, general) ?       MAC   Please route your response to [facility])

## 2024-08-20 NOTE — Telephone Encounter (Signed)
 Mailbox is full so MyChart

## 2024-08-20 NOTE — Telephone Encounter (Signed)
   Patient Name: Louis Barton  DOB: 1941-01-03 MRN: [id number]  Primary Cardiologist: Lamar Fitch, MD  Chart reviewed as part of pre-operative protocol coverage. Given past medical history and time since last visit, based on ACC/AHA guidelines, [name] is at acceptable risk for the planned procedure without further cardiovascular testing.   Per Dr. Krasowski, Should be fine to do the procedure, Xarelto  can be held for 2 days before procedure and restarted as quickly as feasible from GI point of view .   Please resume Xarelto  as soon as possible postprocedure, at the discretion of the surgeon.   I will route this recommendation to the requesting party via Epic fax function and remove from pre-op pool.  Please call with questions.  Damien JAYSON Braver, NP 08/20/2024, 10:30 AM

## 2024-08-21 NOTE — Telephone Encounter (Signed)
 Patient made aware and voiced understanding.

## 2024-08-29 ENCOUNTER — Other Ambulatory Visit: Payer: Self-pay | Admitting: Neurology

## 2024-08-29 DIAGNOSIS — G3184 Mild cognitive impairment, so stated: Secondary | ICD-10-CM

## 2024-08-29 DIAGNOSIS — R269 Unspecified abnormalities of gait and mobility: Secondary | ICD-10-CM

## 2024-09-03 ENCOUNTER — Telehealth: Payer: Self-pay | Admitting: Cardiology

## 2024-09-03 NOTE — Telephone Encounter (Signed)
*  STAT* If patient is at the pharmacy, call can be transferred to refill team.   1. Which medications need to be refilled? (please list name of each medication and dose if known) furosemide (LASIX) 20 MG tablet   2. Which pharmacy/location (including street and city if local pharmacy) is medication to be sent to? [id number] - [place], [place] - [address]   3. Do they need a 30 day or 90 day supply? 90

## 2024-09-03 NOTE — Telephone Encounter (Signed)
 Returned call to pt, he has been made aware that [facility] 2 [facility]ills.    He thanked me for the call and [facility]

## 2024-09-04 ENCOUNTER — Ambulatory Visit: Payer: Self-pay | Admitting: Gastroenterology

## 2024-09-04 ENCOUNTER — Other Ambulatory Visit: Payer: Self-pay | Admitting: Gastroenterology

## 2024-09-04 ENCOUNTER — Ambulatory Visit (HOSPITAL_COMMUNITY)
Admission: RE | Admit: 2024-09-04 | Discharge: 2024-09-04 | Disposition: A | Discharge status: Home / Self Care | Source: Ambulatory Visit | Attending: Gastroenterology | Admitting: Gastroenterology

## 2024-09-04 DIAGNOSIS — K219 Gastro-esophageal reflux disease without esophagitis: Secondary | ICD-10-CM

## 2024-09-04 DIAGNOSIS — R1310 Dysphagia, unspecified: Secondary | ICD-10-CM | POA: Diagnosis present

## 2024-09-04 DIAGNOSIS — K449 Diaphragmatic hernia without obstruction or gangrene: Secondary | ICD-10-CM | POA: Insufficient documentation

## 2024-09-04 DIAGNOSIS — K225 Diverticulum of esophagus, acquired: Secondary | ICD-10-CM | POA: Insufficient documentation

## 2024-09-04 NOTE — Progress Notes (Signed)
 Please inform the patient. Ba Swallow: Large symptomatic Zenker's diverticulum causing problems  Pl make clinic appt with Dr Darin Dufault at [facility] [phone]) for possible Z-POEM Increase omeprazole  20mg  po BID. Open capsule and put in apple sauce. Hold off on Mg tab Cancel EGD with me for now.  Send report to family physician

## 2024-09-06 ENCOUNTER — Other Ambulatory Visit: Payer: Self-pay | Admitting: *Deleted

## 2024-09-06 ENCOUNTER — Telehealth: Payer: Self-pay

## 2024-09-06 MED ORDER — FUROSEMIDE 20 MG PO TABS: 20.0000 mg | ORAL_TABLET | Freq: Every day | ORAL | 3 refills | Status: AC

## 2024-09-06 NOTE — Telephone Encounter (Signed)
 Pt's daughter, Dorian, [place] on file, called me back.  She said pt saw his PCP, Dr. Thurmond, approximately 2 weeks after he saw Dr. Bernie, pt was sob and crackling in the lungs.  Per CXR, had fluid build up.  Dr. Thurmond advised pt to increase Lasix  to 40 bid for 1 week, now pt is out early and needs a refill.   I am sending to #1, make Dr. Priscella aware, and #2 to get ok for early refill and to see how to do directions so pt can have extra if needed.    Please advise.

## 2024-09-06 NOTE — Telephone Encounter (Signed)
 Dr. Thurmond had increased pts Lasix  to 40mg  twice daily x 1 week and now pt is running out early- refill sent to pharmacy.

## 2024-09-06 NOTE — Telephone Encounter (Signed)
°*  STAT* If patient is at the pharmacy, call can be transferred to refill team.   1. Which medications need to be refilled? (please list name of each medication and dose if known)   furosemide  (LASIX ) 20 MG tablet   2. Would you like to learn more about the convenience, safety, & potential cost savings by using the [facility]?   3. Are you open to using the [facility] (Type [facility]. ).  4. Which pharmacy/location (including street and city if local pharmacy) is medication to be sent to?  [id number] - [place], [place] - [address]   5. Do they need a 30 day or 90 day supply?   90 day  Daughter Ardeen) stated patient only has 1 tablet left.  Daughter noted patient recently had to double up on his dosage.

## 2024-09-06 NOTE — Telephone Encounter (Signed)
 Returned call to pt's daughter, Louis Barton, [place] on file.  Left her a message to call back so we can verify what dosage pt is taking of Lasix .

## 2024-09-10 NOTE — Telephone Encounter (Signed)
 Spoke with daughter who stated that she did get the refill on the lasix  and the pt is not having any swelling or shortness of breath at this time.Dr. Thurmond had seen the pt and increased the lasix  for a few days ant the symptoms have since resolved.

## 2024-09-11 ENCOUNTER — Other Ambulatory Visit: Payer: Self-pay

## 2024-09-11 DIAGNOSIS — I5032 Chronic diastolic (congestive) heart failure: Secondary | ICD-10-CM

## 2024-09-11 DIAGNOSIS — R0609 Other forms of dyspnea: Secondary | ICD-10-CM

## 2024-09-11 NOTE — Telephone Encounter (Signed)
Labs ordered per Dr. Vanetta Shawl note

## 2024-09-12 LAB — BASIC METABOLIC PANEL WITH GFR
BUN/Creatinine Ratio: 17 (ref 10–24)
BUN: 20 mg/dL (ref 8–27)
CO2: 24 mmol/L (ref 20–29)
Calcium: 8.7 mg/dL (ref 8.6–10.2)
Chloride: 105 mmol/L (ref 96–106)
Creatinine, Ser: 1.2 mg/dL (ref 0.76–1.27)
Glucose: 81 mg/dL (ref 70–99)
Potassium: 4 mmol/L (ref 3.5–5.2)
Sodium: 143 mmol/L (ref 134–144)
eGFR: 59 mL/min/1.73 — ABNORMAL LOW (ref >59)

## 2024-09-12 LAB — PRO B NATRIURETIC PEPTIDE: NT-Pro BNP: 5996 pg/mL — ABNORMAL HIGH (ref 0–486)

## 2024-09-13 ENCOUNTER — Ambulatory Visit: Payer: Self-pay | Admitting: Cardiology

## 2024-09-13 ENCOUNTER — Telehealth: Payer: Self-pay

## 2024-09-13 DIAGNOSIS — R0609 Other forms of dyspnea: Secondary | ICD-10-CM

## 2024-09-13 NOTE — Telephone Encounter (Signed)
 Left message on My Chart with lab results per Dr. Karry note. Routed to PCP

## 2024-09-18 LAB — BASIC METABOLIC PANEL WITH GFR
BUN/Creatinine Ratio: 18 (ref 10–24)
BUN: 18 mg/dL (ref 8–27)
CO2: 24 mmol/L (ref 20–29)
Calcium: 8.8 mg/dL (ref 8.6–10.2)
Chloride: 105 mmol/L (ref 96–106)
Creatinine, Ser: 1 mg/dL (ref 0.76–1.27)
Glucose: 61 mg/dL — ABNORMAL LOW (ref 70–99)
Potassium: 3.6 mmol/L (ref 3.5–5.2)
Sodium: 145 mmol/L — ABNORMAL HIGH (ref 134–144)
eGFR: 74 mL/min/1.73

## 2024-09-18 LAB — PRO B NATRIURETIC PEPTIDE: NT-Pro BNP: 4619 pg/mL — ABNORMAL HIGH (ref 0–486)

## 2024-09-20 ENCOUNTER — Other Ambulatory Visit: Payer: Self-pay

## 2024-09-20 ENCOUNTER — Ambulatory Visit: Payer: Self-pay | Admitting: Cardiology

## 2024-10-03 ENCOUNTER — Encounter (HOSPITAL_BASED_OUTPATIENT_CLINIC_OR_DEPARTMENT_OTHER): Payer: Self-pay

## 2024-10-03 ENCOUNTER — Ambulatory Visit (HOSPITAL_BASED_OUTPATIENT_CLINIC_OR_DEPARTMENT_OTHER)
Admission: EM | Admit: 2024-10-03 | Discharge: 2024-10-03 | Disposition: A | Discharge status: Home / Self Care | Source: Ambulatory Visit | Attending: Family Medicine | Admitting: Family Medicine

## 2024-10-03 ENCOUNTER — Ambulatory Visit (HOSPITAL_BASED_OUTPATIENT_CLINIC_OR_DEPARTMENT_OTHER): Admit: 2024-10-03 | Discharge: 2024-10-03 | Disposition: A | Discharge status: Home / Self Care | Admitting: Radiology

## 2024-10-03 DIAGNOSIS — R051 Acute cough: Secondary | ICD-10-CM | POA: Diagnosis not present

## 2024-10-03 MED ORDER — AMOXICILLIN-POT CLAVULANATE 875-125 MG PO TABS: 1.0000 | ORAL_TABLET | Freq: Two times a day (BID) | ORAL | 0 refills | Status: DC

## 2024-10-03 MED ORDER — AZITHROMYCIN 250 MG PO TABS: 250.0000 mg | ORAL_TABLET | Freq: Every day | ORAL | 0 refills | Status: DC

## 2024-10-03 NOTE — Discharge Instructions (Signed)
 Treating for respiratory infection. Take the antibiotics as prescribed. Can continue OTC mucinex for cough, congestion and to thin mucous. If symptoms worsen go to the ER.   Drink lots of fluids.

## 2024-10-03 NOTE — ED Provider Notes (Signed)
 " Louis Barton    CSN: [id number] Arrival date & time: 10/03/24  0946      History   Chief Complaint Chief Complaint  Patient presents with   Flu A    HPI Louis Barton is a 84 y.o. male.   Patient is a 84 year old male who presents today with worsening cough.  Patient dx with Influenza A on Monday night at [facility] ER. Had IV fluids, lab work, CXR done and lungs good at that time. Patient has had a significant amount of phlegm and thick secretions with cough. Hx of lung cancer. Patient had actually been ill for several days before flu test done so family member believes it was a little too late to start tamiflu although patient did take it. Will finish on Saturday. Occ low grade fever but none since ER visit.      Past Medical History:  Diagnosis Date   Abnormal stress test    Acute encephalopathy 05/05/2021   Arteriosclerosis of coronary artery 11/16/2015   Formatting of this note might be different from the original.  Formatting of this note might be different from the original.  Formatting of this note might be different from the original.  On imaging;  Formatting of this note might be different from the original.  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  02/2021  Formatting of this note might be different from the original.  Formatting of this note might be different from the original.  02/2021     Chronic kidney disease    cysts on both kidneys, seeing DOROTHA Sharps in [place],  [facility]    Coronary arteriosclerosis 11/16/2015   Formatting of this note might be different from the original. On imaging;   COVID 05/05/2021   COVID-19 virus infection 05/05/2021   Cramps of left lower extremity-Calf  > right calf 01/01/2015   Cyst, meninges, spinal 11/16/2015   Degeneration of lumbar intervertebral disc 11/16/2015   Formatting of this note might be different from the original. signficant DDD present with vacuum effect L4-5 and L5-S1 flat discs and anterior spurring noted   Dizziness 05/06/2020   Dyslipidemia 03/18/2015   Dyspnea on exertion 04/02/2020   Edema 11/16/2015   Edema of both lower extremities due to peripheral venous insufficiency 02/03/2020   Elevated tumor markers 12/15/2017   Erectile dysfunction 11/16/2015   First degree AV block 10/20/2020   Gait abnormality 04/19/2022   Heart block 05/05/2021   Heart failure (HCC)    History of hiatal hernia    seen on last scan- as slight    History of thrombocytopenia    History of TIAs    Hypercoagulable state due to persistent atrial fibrillation (HCC) 08/11/2021   Hyperlipidemia    Hypertension    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 [place]. And Dr. Darlean Deal of this note might be different from the original.  Formatting of this note might be different from the original.  Stage 1A. [place]. And Dr. Darlean Deal of this note might be different from the original.  Stage 1A. [place]. And Dr. Darlean Adenocarcinoma (non-small cell). Observation.     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  Formatting of this note might be different from the original.  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 Renal parapelvic cysts 10/06/16 on CT , right kidney midpole 1.2 cm, right kidney anterior pole 1.1 cm, and to the left kidney lower pole anterior position 3.5cm all felt to be benign   Right bundle branch block 10/20/2020   Right lower lobe pulmonary nodule 11/09/2017   Secondary hypercoagulable state 08/11/2021   Shingles    internal- obstruction of bowels & gallbladder   Sinus bradycardia 05/05/2021   Solitary pulmonary nodule 11/16/2016   CT ABd 03/24/09 linerar densities in RLL and RML c/w scarring o/w clear CT Abd 10/20/16 c/w 7 mm nodule  - CT chest 02/13/2017 :  slt enlarged to 9 mm   - PET 04/04/17 :1. These slowly enlarging 9 mm right lower lobe pulmonary nodule along the right hemidiaphragm does not appear hypermetabolic today. 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    Cancer (HCC)    Heart  failure (HCC)    Weakness of left lower extremity 09/21/2023   Cerebral infarction (HCC) 09/07/2023   Fall 09/07/2023   Fracture of multiple ribs 09/07/2023   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 09/07/2023   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   Syncope and collapse 09/07/2023   Essential hypertension 09/07/2023   Peripheral vascular disease 09/07/2023   Congestive heart failure (HCC) 09/07/2023   Chronic anticoagulation 09/05/2023   Multiple closed fractures of ribs of left side 09/05/2023   Near syncope 09/04/2023   Paroxysmal atrial fibrillation (HCC) 12/23/2022   Gait abnormality 04/19/2022   Pacemaker 10/19/2021   Persistent atrial fibrillation (HCC) 08/11/2021   Hypercoagulable state due to persistent atrial fibrillation (HCC) 08/11/2021   Secondary  hypercoagulable state 08/11/2021   Sprain of left ankle 05/21/2021   COVID-19 virus infection 05/05/2021   Acute encephalopathy 05/05/2021   Sinus bradycardia 05/05/2021   COVID 05/05/2021   Stroke (HCC) 05/04/2021   Chronic kidney disease 05/04/2021   Chronic heart failure with preserved ejection fraction (HCC) 03/31/2021   Mild cognitive impairment 03/31/2021   Typical atrial flutter (HCC) 03/19/2021   Hypertension    History of TIAs    History of hiatal hernia    Carotid stenosis    CAD (coronary artery disease)    Abnormal stress test    Malaise and fatigue 12/08/2020   Right bundle branch block 10/20/2020   First degree AV block 10/20/2020   Stage 3a chronic kidney disease (HCC) 06/17/2020   Ascending aortic aneurysm 05/15/2020   Dizziness 05/06/2020   Shingles    History of thrombocytopenia    Dyspnea on exertion 04/02/2020   Edema of both lower extremities due to peripheral venous insufficiency 02/03/2020   Prediabetes 12/25/2018   Local edema 02/07/2018   Elevated tumor markers 12/15/2017    Class: Chronic   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) 12/08/2017    Class: History of    Right lower lobe pulmonary nodule 11/09/2017   Vitamin B12 deficiency 06/21/2017   Irritable bowel syndrome 11/17/2016   Solitary pulmonary nodule 11/16/2016   Neuritis 10/24/2016   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   Benign paroxysmal positional vertigo 08/08/2016   Hyperlipidemia 11/16/2015   Arteriosclerosis of coronary artery 11/16/2015   Edema 11/16/2015   Coronary arteriosclerosis 11/16/2015   Degeneration of lumbar intervertebral disc 11/16/2015   Paralabral cyst of hip 11/16/2015   Benign neoplasm of colon 11/16/2015   Thrombocytopenia 11/16/2015   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   Erectile dysfunction 11/16/2015   Cyst, meninges, spinal 11/16/2015   Atherosclerosis 11/16/2015   Atypical chest pain 06/11/2015   Symptomatic bradycardia 03/18/2015   Dyslipidemia 03/18/2015   PAD (peripheral artery disease) 03/18/2015   Bradycardia 03/18/2015   Cramps of left lower extremity-Calf  > right calf 01/01/2015   Swelling of limb-Left foot 01/01/2015   Preop cardiovascular exam 04/14/2011    Past Surgical History:  Procedure Laterality Date   adenocarcinoma  2019   Removed   CARDIOVERSION N/A 03/22/2021   Procedure: CARDIOVERSION;  Surgeon: Louis Redell RAMAN, MD;  Location: [facility] ENDOSCOPY;  Service: Cardiovascular;  Laterality: N/A;   CARDIOVERSION N/A 12/15/2021   Procedure: CARDIOVERSION;  Surgeon: Louis Ronal BRAVO, MD;  Location: [facility] ENDOSCOPY;  Service: Cardiovascular;  Laterality: N/A;   CARDIOVERSION N/A 11/23/2023   Procedure: CARDIOVERSION;  Surgeon: Louis Oneil BROCKS, MD;  Location: [facility] INVASIVE CV LAB;  Service: Cardiovascular;  Laterality: N/A;   CAROTID ENDARTERECTOMY  2005   Left carotid by Dr. Harvey   CAROTID ENDARTERECTOMY  04/25/2011   Right Carotid by Dr. Harvey   COLONOSCOPY  02/23/2017   Colonic polyp status post polypectomy. Pancolonic diverticulosis predominatly in the sigmoid colon. Tubular adenoma   LEAD REVISION/REPAIR N/A 08/16/2021   Procedure: LEAD  REVISION/REPAIR;  Surgeon: Louis Soyla Lunger, MD;  Location: [facility] INVASIVE CV LAB;  Service: Cardiovascular;  Laterality: N/A;   LEFT HEART CATH AND CORONARY ANGIOGRAPHY N/A 02/08/2021   Procedure: LEFT HEART CATH AND CORONARY ANGIOGRAPHY;  Surgeon: Louis Lonni BIRCH, MD;  Location: [facility] INVASIVE CV LAB;  Service: Cardiovascular;  Laterality: N/A;   MOHS SURGERY     PACEMAKER IMPLANT N/A 07/28/2021   Procedure: PACEMAKER IMPLANT;  Surgeon: Louis Soyla Lunger, MD;  Location: [facility]  INVASIVE CV LAB;  Service: Cardiovascular;  Laterality: N/A;   Septoplasty, nasal w/ submucosal resection  1960   SQUAMOUS CELL CARCINOMA EXCISION     pt said numerous times   TEE WITHOUT CARDIOVERSION N/A 03/22/2021   Procedure: TRANSESOPHAGEAL ECHOCARDIOGRAM (TEE);  Surgeon: Louis Redell RAMAN, MD;  Location: [facility] ENDOSCOPY;  Service: Cardiovascular;  Laterality: N/A;   TONSILLECTOMY     VIDEO ASSISTED THORACOSCOPY (VATS)/WEDGE RESECTION Right 11/09/2017   Procedure: RIGHT VIDEO ASSISTED THORACOSCOPY WITH MERVIN RESECTION;  Surgeon: Kerrin Louis BROCKS, MD;  Location: [facility] OR;  Service: Thoracic;  Laterality: Right;       Home Medications    Prior to Admission medications  Medication Sig Start Date End Date Taking? Authorizing Provider  amoxicillin -clavulanate (AUGMENTIN ) 875-125 MG tablet Take 1 tablet by mouth every 12 (twelve) hours. 10/03/24  Yes Junia Nygren A, FNP  azithromycin  (ZITHROMAX ) 250 MG tablet Take 1 tablet (250 mg total) by mouth daily. Take first 2 tablets together, then 1 every day until finished. 10/03/24  Yes Haeli Gerlich A, FNP  colesevelam  (WELCHOL ) 625 MG tablet Take 1,250 mg by mouth 2 (two) times daily with a meal.    [provider]  furosemide  (LASIX ) 20 MG tablet Take 1 tablet (20 mg total) by mouth daily. May take additional doses as directed by physician for increased swelling or shortness of breath 09/06/24 12/05/24  Louis Lamar PARAS, MD  metoprolol  succinate (TOPROL -XL) 25  MG 24 hr tablet Take 12.5 mg by mouth daily.    [provider]  omeprazole  (PRILOSEC) 20 MG capsule Take 1 capsule (20 mg total) by mouth daily. 08/16/24   Louis Groom, MD  Polyethyl Glycol-Propyl Glycol (SYSTANE) 0.4-0.3 % SOLN Place 1 drop into both eyes daily.    [provider]  potassium chloride  (KLOR-CON  M) 10 MEQ tablet TAKE 1 TABLET (10 MEQ TOTAL) BY MOUTH DAILY AS NEEDED (TAKE ON DAYS LASIX  IS TAKEN). 01/01/24   Louis Fairy PARAS, PA-C  rivaroxaban  (XARELTO ) 20 MG TABS tablet Take 1 tablet (20 mg total) by mouth daily with supper. 02/13/24   Louis Barton, Louis J, MD  rosuvastatin  (CRESTOR ) 40 MG tablet Take 1 tablet (40 mg total) by mouth at bedtime. 05/13/21   Louis DELENA Meliton Mickey., MD  TRELEGY ELLIPTA 100-62.5-25 MCG/ACT AEPB Inhale 1 puff into the lungs daily. 07/10/24   [provider]    Family History Family History  Problem Relation Age of Onset   Cancer Mother 64       Breast   Stroke Mother    Hyperlipidemia Mother    Other Mother        varicose veins   Heart attack Mother    Heart disease Mother        Left ankle swelling   Hypertension Mother    Deep vein thrombosis Mother    Varicose Veins Mother    Heart attack Father    Other Sister        Tumor in Lung and Brain   Cancer Sister        Tumor   Lung  and  Brain   Cancer Brother        BCC-SCC-Merkle Cell   Memory loss Paternal Grandfather    Colon cancer Neg Hx    Esophageal cancer Neg Hx    Rectal cancer Neg Hx    Stomach cancer Neg Hx    Alzheimer's disease Neg Hx    Dementia Neg Hx     Social History  Social History[1]   Allergies   Patient has no known allergies.   Review of Systems Review of Systems See HPI  Physical Exam Triage Vital Signs ED Triage Vitals  Encounter Vitals Group     BP 10/03/24 1043 (!) 154/83     Girls Systolic BP Percentile --      Girls Diastolic BP Percentile --      Boys Systolic BP Percentile --      Boys Diastolic BP Percentile  --      Pulse Rate 10/03/24 1043 62     Resp 10/03/24 1043 20     Temp 10/03/24 1043 98.4 F (36.9 C)     Temp Source 10/03/24 1043 Oral     SpO2 10/03/24 1043 93 %     Weight --      Height --      Head Circumference --      Peak Flow --      Pain Score 10/03/24 1047 2     Pain Loc --      Pain Education --      Exclude from Growth Chart --    No data found.  Updated Vital Signs BP (!) 154/83 (BP Location: Right Arm)   Pulse 62   Temp 98.4 F (36.9 C) (Oral)   Resp 20   SpO2 93%   Visual Acuity Right Eye Distance:   Left Eye Distance:   Bilateral Distance:    Right Eye Near:   Left Eye Near:    Bilateral Near:     Physical Exam Constitutional:      Appearance: Normal appearance. He is ill-appearing.  HENT:     Mouth/Throat:     Pharynx: Oropharynx is clear.  Eyes:     Conjunctiva/sclera: Conjunctivae normal.  Pulmonary:     Effort: Pulmonary effort is normal. No accessory muscle usage or respiratory distress.     Breath sounds: Decreased breath sounds present.  Musculoskeletal:        General: Normal range of motion.  Neurological:     Mental Status: He is alert.  Psychiatric:        Mood and Affect: Mood normal.      UC Treatments / Results  Labs (all labs ordered are listed, but only abnormal results are displayed) Labs Reviewed - No data to display  EKG   Radiology DG Chest 2 View Result Date: 10/03/2024 CLINICAL DATA:  Productive cough.  Recent influenza diagnosis. EXAM: CHEST - 2 VIEW COMPARISON:  10/01/2023 FINDINGS: Stable enlarged cardiac silhouette and small to moderate-sized bilateral pleural effusions. Mild patchy density in the right lower lung zone, mildly increased. Minimal left basilar atelectasis with improvement. Stable left subclavian bipolar pacemaker leads, bilateral neck surgical clips and diffuse osteopenia. IMPRESSION: 1. Mildly increased patchy atelectasis or pneumonia in the right lower lung zone. 2. Stable small to  moderate-sized bilateral pleural effusions. 3. Stable cardiomegaly. Electronically Signed   By: Louis Barton M.D.   On: 10/03/2024 12:23    Procedures Procedures (including critical Barton time)  Medications Ordered in UC Medications - No data to display  Initial Impression / Assessment and Plan / UC Course  I have reviewed the triage vital signs and the nursing notes.  Pertinent labs & imaging results that were available during my Barton of the patient were reviewed by me and considered in my medical decision making (see chart for details).    Right lower lung pneumonia.  X-ray results as follows  Mildly increased  patchy atelectasis or pneumonia in the right lower lung zone. 2. Stable small to moderate-sized bilateral pleural effusions. 3. Stable cardiomegaly. Based on recent flu diagnosis and worsening cough and congestion we will go ahead and treat for pneumonia at this time.  Antibiotics as prescribed.  He can continue over-the-counter Mucinex for cough and congestion and thin mucus.  For worsening symptoms will need to go to the ER.  Patient and family agree. Final Clinical Impressions(s) / UC Diagnoses   Final diagnoses:  Acute cough     Discharge Instructions      Treating for respiratory infection. Take the antibiotics as prescribed. Can continue OTC mucinex for cough, congestion and to thin mucous. If symptoms worsen go to the ER.   Drink lots of fluids.      ED Prescriptions     Medication Sig Dispense Auth. Provider   amoxicillin -clavulanate (AUGMENTIN ) 875-125 MG tablet Take 1 tablet by mouth every 12 (twelve) hours. 14 tablet Alyviah Crandle A, FNP   azithromycin  (ZITHROMAX ) 250 MG tablet Take 1 tablet (250 mg total) by mouth daily. Take first 2 tablets together, then 1 every day until finished. 6 tablet Adah Wilbert LABOR, FNP      PDMP not reviewed this encounter.     [1]  Social History Tobacco Use   Smoking status: Former    Current packs/day: 0.00     Average packs/day: 1 pack/day for 16.0 years (16.0 ttl pk-yrs)    Types: Cigarettes    Start date: 09/26/1956    Quit date: 09/26/1972    Years since quitting: 52.0   Smokeless tobacco: Never   Tobacco comments:    Former smoker 08/11/21  Vaping Use   Vaping status: Never Used  Substance Use Topics   Alcohol  use: Not Currently    Alcohol /week: 7.0 standard drinks of alcohol     Types: 7 Shots of liquor per week    Comment: 1 shot daily 08/11/21   Drug use: No     Adah Wilbert LABOR, FNP 10/03/24 1617  "

## 2024-10-03 NOTE — ED Triage Notes (Addendum)
 Patient dx with Influenza A on Monday night at [facility] ER. Had IV fluids, lab work, CXR done and lungs good at that time. Patient has had a significant amount of phlegm and thick secretions with cough. Hx of lung cancer. Patient had actually been ill for several days before flu test done so family member believes it was a little too late to start tamiflu altho patient did take it. Will finish on Saturday. Occ low grade fever but none since ER visit.

## 2024-10-15 ENCOUNTER — Encounter: Admitting: Gastroenterology

## 2024-10-15 ENCOUNTER — Telehealth: Payer: Self-pay | Admitting: Gastroenterology

## 2024-10-15 NOTE — Telephone Encounter (Signed)
 Called patient regarding procedure this morning.  He states he forgot.  When asked if he wanted to reschedule, he hung up.  Medicare insurance.

## 2024-10-17 ENCOUNTER — Encounter: Payer: Self-pay | Admitting: *Deleted

## 2024-10-18 ENCOUNTER — Ambulatory Visit: Attending: Cardiology | Admitting: Cardiology

## 2024-10-18 ENCOUNTER — Encounter: Payer: Self-pay | Admitting: Cardiology

## 2024-10-18 VITALS — BP 160/98 | HR 68 | Ht 68.0 in | Wt 155.0 lb

## 2024-10-18 DIAGNOSIS — E785 Hyperlipidemia, unspecified: Secondary | ICD-10-CM | POA: Diagnosis present

## 2024-10-18 DIAGNOSIS — I2510 Atherosclerotic heart disease of native coronary artery without angina pectoris: Secondary | ICD-10-CM | POA: Diagnosis present

## 2024-10-18 DIAGNOSIS — K225 Diverticulum of esophagus, acquired: Secondary | ICD-10-CM | POA: Insufficient documentation

## 2024-10-18 DIAGNOSIS — Z01810 Encounter for preprocedural cardiovascular examination: Secondary | ICD-10-CM | POA: Diagnosis present

## 2024-10-18 DIAGNOSIS — I10 Essential (primary) hypertension: Secondary | ICD-10-CM | POA: Insufficient documentation

## 2024-10-18 DIAGNOSIS — R0609 Other forms of dyspnea: Secondary | ICD-10-CM | POA: Insufficient documentation

## 2024-10-18 NOTE — Patient Instructions (Signed)
 Medication Instructions:  Your physician recommends that you continue on your current medications as directed. Please refer to the Current Medication list given to you today.  *If you need a refill on your cardiac medications before your next appointment, please call your pharmacy*   Lab Work: None Ordered If you have labs (blood work) drawn today and your tests are completely normal, you will receive your results only by: MyChart Message (if you have MyChart) OR A paper copy in the mail If you have any lab test that is abnormal or we need to change your treatment, we will call you to review the results.   Testing/Procedures:  Your physician has requested that you have an echocardiogram. Echocardiography is a painless test that uses sound waves to create images of your heart. It provides your doctor with information about the size and shape of your heart and how well your heart's chambers and valves are working. This procedure takes approximately one hour. There are no restrictions for this procedure. Please do NOT wear cologne, perfume, aftershave, or lotions (deodorant is allowed). Please arrive 15 minutes prior to your appointment time.  Please note: We ask at that you not bring children with you during ultrasound (echo/ vascular) testing. Due to room size and safety concerns, children are not allowed in the ultrasound rooms during exams. Our front office staff cannot provide observation of children in our lobby area while testing is being conducted. An adult accompanying a patient to their appointment will only be allowed in the ultrasound room at the discretion of the ultrasound technician under special circumstances. We apologize for any inconvenience.  Your physician has requested that you have a lexiscan  myoview . For further information please visit [email or web address] Please follow instruction sheet, as given.  The test will take approximately 3 to 4 hours to complete; you may bring  reading material.  If someone comes with you to your appointment, they will need to remain in the main lobby due to limited space in the testing area. **If you are pregnant or breastfeeding, please notify the nuclear lab prior to your appointment**  How to prepare for your Myocardial Perfusion Test: Do not eat or drink 3 hours prior to your test, except you may have water . Do not consume products containing caffeine (regular or decaffeinated) 12 hours prior to your test. (ex: coffee, chocolate, sodas, tea). Do bring a list of your current medications with you.  If not listed below, you may take your medications as normal. Do wear comfortable clothes (no dresses or overalls) and walking shoes, tennis shoes preferred (No heels or open toe shoes are allowed). Do NOT wear cologne, perfume, aftershave, or lotions (deodorant is allowed). If these instructions are not followed, your test will have to be rescheduled.     Follow-Up: At [facility], you and your health needs are our priority.  As part of our continuing mission to provide you with exceptional heart care, we have created designated Provider Care Teams.  These Care Teams include your primary Cardiologist (physician) and Advanced Practice Providers (APPs -  Physician Assistants and Nurse Practitioners) who all work together to provide you with the care you need, when you need it.  We recommend signing up for the patient portal called MyChart.  Sign up information is provided on this After Visit Summary.  MyChart is used to connect with patients for Virtual Visits (Telemedicine).  Patients are able to view lab/test results, encounter notes, upcoming appointments, etc.  Non-urgent messages can be sent  to your provider as well.   To learn more about what you can do with MyChart, go to [email or web address].    Your next appointment:   3 month(s)  The format for your next appointment:   In Person  Provider:   Lamar Fitch, MD     Other Instructions NA

## 2024-10-18 NOTE — Progress Notes (Signed)
 " Cardiology Office Note:    Date:  10/18/2024   ID:  Louis Barton, DOB 1941-03-17, MRN [id number]  PCP:  Thurmond Cathlyn LABOR., MD  Cardiologist:  Lamar Fitch, MD    Referring MD: Thurmond Cathlyn LABOR., MD   Chief Complaint  Patient presents with   Follow-up    History of Present Illness:     Louis Barton is a 84 y.o. male very complex past medical history significant for coronary artery disease with cardiac catheterization done in 2022 showing 20% mid and distal circumflex as well as 20% mid LAD, essential hypertension, high degree AV block required pacing, atrial fibrillation which is permanent.  He comes today to my office he was found to have large Zenker's diverticuli and surgery is contemplated.  He comes today with his daughter who participated with his decision making.  He overall clearly deteriorated both physically and mentally.  He is very speaking very slowly according to the daughter he did have some episodes of hallucinations.  Denies have any chest pain tightness squeezing pressure burning chest cardiac wise seems to be doing quite well but overall ability to exercise overall is diminished because of deterioration overall.  He did have some flu a couple weeks ago and he is still recovering from it.  Past Medical History:  Diagnosis Date   Abnormal stress test    Acute encephalopathy 05/05/2021   Arteriosclerosis of coronary artery 11/16/2015   Formatting of this note might be different from the original.  Formatting of this note might be different from the original.  Formatting of this note might be different from the original.  On imaging;  Formatting of this note might be different from the original.  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  02/2021  Formatting of this note might be different from the original.  Formatting of this note might be different from the original.  02/2021     Chronic kidney disease    cysts on both kidneys, seeing DOROTHA Sharps in [place], [facility]    Coronary arteriosclerosis 11/16/2015   Formatting of this note might be different from the original. On imaging;   COVID 05/05/2021   COVID-19 virus infection 05/05/2021   Cramps of left lower extremity-Calf  > right calf 01/01/2015   Cyst, meninges, spinal 11/16/2015   Degeneration of lumbar intervertebral disc 11/16/2015   Formatting of this note might be different from the original. signficant DDD present with vacuum effect L4-5 and L5-S1 flat discs and anterior spurring noted   Dizziness 05/06/2020   Dyslipidemia 03/18/2015   Dyspnea on exertion 04/02/2020   Edema 11/16/2015   Edema of both lower extremities due to peripheral venous insufficiency 02/03/2020   Elevated tumor markers 12/15/2017   Erectile dysfunction 11/16/2015   First degree AV block 10/20/2020   Gait abnormality 04/19/2022   Heart block 05/05/2021   Heart failure (HCC)    History of hiatal hernia  seen on last scan- as slight    History of thrombocytopenia    History of TIAs    Hypercoagulable state due to persistent atrial fibrillation (HCC) 08/11/2021   Hyperlipidemia    Hypertension    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 [place]. And Dr. Darlean Deal of this note might be different  from the original.  Formatting of this note might be different from the original.  Stage 1A. [place]. And Dr. Darlean Deal of this note might be different from the original.  Stage 1A. [place]. And Dr. Darlean Adenocarcinoma (non-small cell). Observation.     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  Formatting of this note might be different from the original.  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 Renal parapelvic cysts 10/06/16 on CT , right kidney midpole 1.2 cm, right kidney anterior pole 1.1 cm, and to the left kidney lower pole anterior position 3.5cm all felt to be benign   Right bundle branch block 10/20/2020   Right lower lobe pulmonary nodule 11/09/2017   Secondary hypercoagulable state 08/11/2021   Shingles    internal- obstruction of bowels & gallbladder   Sinus bradycardia 05/05/2021   Solitary pulmonary nodule 11/16/2016   CT ABd 03/24/09 linerar densities in RLL and RML c/w scarring o/w clear CT Abd 10/20/16 c/w 7 mm nodule  - CT chest 02/13/2017 :  slt enlarged to 9 mm   - PET 04/04/17 :1. These slowly enlarging 9 mm right lower lobe pulmonary nodule along the right hemidiaphragm does not appear hypermetabolic today. 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    Stage 3a chronic kidney disease (HCC) 06/17/2020   Stroke (HCC)    Swelling of limb-Left foot 01/01/2015   Symptomatic bradycardia 03/18/2015   Thrombocytopenia 11/16/2015   Typical atrial flutter (HCC) 03/19/2021   Vitamin B12 deficiency 06/21/2017   Weakness of left lower extremity 09/21/2023    Past Surgical History:  Procedure Laterality Date   adenocarcinoma  2019   Removed   CARDIOVERSION N/A 03/22/2021   Procedure: CARDIOVERSION;  Surgeon: Pietro Redell RAMAN, MD;  Location: [facility] ENDOSCOPY;  Service: Cardiovascular;  Laterality: N/A;   CARDIOVERSION N/A 12/15/2021   Procedure: CARDIOVERSION;  Surgeon: Alvan Ronal BRAVO, MD;  Location: [facility] ENDOSCOPY;  Service: Cardiovascular;  Laterality: N/A;   CARDIOVERSION N/A 11/23/2023   Procedure: CARDIOVERSION;  Surgeon: Jeffrie Oneil BROCKS, MD;  Location: [facility] INVASIVE CV LAB;  Service: Cardiovascular;  Laterality: N/A;   CAROTID ENDARTERECTOMY  2005   Left carotid by Dr. Harvey   CAROTID ENDARTERECTOMY  04/25/2011   Right Carotid by Dr. Harvey   COLONOSCOPY  02/23/2017   Colonic polyp status post polypectomy. Pancolonic diverticulosis predominatly in the sigmoid colon. Tubular adenoma   LEAD REVISION/REPAIR N/A 08/16/2021   Procedure: LEAD REVISION/REPAIR;  Surgeon: Inocencio Soyla Lunger, MD;  Location: [facility] INVASIVE CV LAB;  Service: Cardiovascular;  Laterality: N/A;   LEFT HEART CATH AND CORONARY ANGIOGRAPHY N/A 02/08/2021   Procedure: LEFT HEART CATH AND CORONARY ANGIOGRAPHY;  Surgeon: Verlin Lonni BIRCH, MD;  Location: [facility] INVASIVE CV LAB;  Service: Cardiovascular;  Laterality: N/A;   MOHS SURGERY     PACEMAKER IMPLANT N/A 07/28/2021   Procedure: PACEMAKER IMPLANT;  Surgeon: Inocencio Soyla Lunger, MD;  Location: [facility] INVASIVE CV LAB;  Service: Cardiovascular;  Laterality: N/A;   Septoplasty, nasal w/ submucosal resection  1960   SQUAMOUS CELL CARCINOMA EXCISION     pt said numerous times   TEE WITHOUT CARDIOVERSION N/A 03/22/2021    Procedure: TRANSESOPHAGEAL ECHOCARDIOGRAM (TEE);  Surgeon: Pietro Redell RAMAN, MD;  Location: [facility] ENDOSCOPY;  Service: Cardiovascular;  Laterality: N/A;   TONSILLECTOMY     VIDEO ASSISTED THORACOSCOPY (VATS)/WEDGE RESECTION Right 11/09/2017   Procedure: RIGHT VIDEO ASSISTED THORACOSCOPY WITH MERVIN RESECTION;  Surgeon: Kerrin Elspeth BROCKS, MD;  Location: [facility] OR;  Service: Thoracic;  Laterality: Right;    Current Medications: Active Medications[1]   Allergies:   Patient has no known allergies.   Social History   Socioeconomic History   Marital status: Widowed    Spouse name: Not on file   Number of children: 2   Years of education: Not on file   Highest education level: Not on file  Occupational History   Not on file  Tobacco Use   Smoking status: Former    Current packs/day: 0.00    Average packs/day: 1 pack/day for 16.0 years (16.0 ttl pk-yrs)    Types: Cigarettes    Start date: 09/26/1956    Quit date: 09/26/1972    Years since quitting: 52.0   Smokeless tobacco: Never   Tobacco comments:    Former smoker 08/11/21  Vaping Use   Vaping status: Never Used  Substance and Sexual Activity   Alcohol  use: Not Currently    Alcohol /week: 7.0 standard drinks of alcohol     Types: 7 Shots of liquor per week    Comment: 1 shot daily 08/11/21   Drug use: No   Sexual activity: Not Currently  Other Topics Concern   Not on file  Social History Narrative   Pt lives alone    Retired    Social Drivers of Health   Tobacco Use: Medium Risk (10/18/2024)   Patient History    Smoking Tobacco Use: Former    Smokeless Tobacco Use: Never    Passive Exposure: Not on Actuary Strain: Not on file  Food Insecurity: Low Risk (08/19/2024)   Received from [facility]   Epic    Within the past 12 months, you worried that your food would run out before you got money to buy more: Never true    Within the past 12 months, the food you bought just didn't last and you didn't have money  to get more. : Never true  Transportation Needs: No Transportation Needs (08/19/2024)   Received from [facility]    In the past 12 months, has lack of reliable transportation kept you from medical appointments, meetings, work  or from getting things needed for daily living? : No  Physical Activity: Not on file  Stress: Not on file  Social Connections: Not on file  Depression (EYV7-0): Low Risk (04/30/2024)   Depression (PHQ2-9)    PHQ-2 Score: 0  Alcohol  Screen: Not on file  Housing: Low Risk (08/19/2024)   Received from [facility]   Epic    What is your living situation today?: I have a steady place to live    Think about the place you live. Do you have problems with any of the following? Choose all that apply:: None/None on this list  Utilities: Low Risk (08/19/2024)   Received from [facility]   Utilities    In the past 12 months has the electric, gas, oil, or water company threatened to shut off services in your home? : No  Health Literacy: Not on file     Family History: The patient's family history includes Cancer in his brother and sister; Cancer (age of onset: 46) in his mother; Deep vein thrombosis in his mother; Heart attack in his father and mother; Heart disease in his mother; Hyperlipidemia in his mother; Hypertension in his mother; Memory loss in his paternal grandfather; Other in his mother and sister; Stroke in his mother; Varicose Veins in his mother. There is no history of Colon cancer, Esophageal cancer, Rectal cancer, Stomach cancer, Alzheimer's disease, or Dementia. ROS:   Please see the history of present illness.    All 14 point review of systems negative except as described per history of present illness  EKGs/Labs/Other Studies Reviewed:         Recent Labs: 12/20/2023: TSH 2.720 04/22/2024: ALT 9 04/30/2024: B Natriuretic Peptide 466.3; Hemoglobin 15.1; Platelets 149 09/17/2024: BUN 18; Creatinine, Ser 1.00; NT-Pro BNP 4,619;  Potassium 3.6; Sodium 145  Recent Lipid Panel    Component Value Date/Time   CHOL 159 11/21/2018 0836   TRIG 82 11/21/2018 0836   HDL 59 11/21/2018 0836   CHOLHDL 2.7 11/21/2018 0836   LDLCALC 84 11/21/2018 0836    Physical Exam:    VS:  BP (!) 160/98   Pulse 68   Ht 5' 8 (1.727 m)   Wt 155 lb (70.3 kg)   BMI 23.57 kg/m     Wt Readings from Last 3 Encounters:  10/18/24 155 lb (70.3 kg)  08/16/24 167 lb (75.8 kg)  07/18/24 171 lb (77.6 kg)     GEN:  Well nourished, well developed in no acute distress HEENT: Normal NECK: No JVD; No carotid bruits LYMPHATICS: No lymphadenopathy CARDIAC: RRR, no murmurs, no rubs, no gallops RESPIRATORY:  Clear to auscultation without rales, wheezing or rhonchi  ABDOMEN: Soft, non-tender, non-distended MUSCULOSKELETAL:  No edema; No deformity  SKIN: Warm and dry LOWER EXTREMITIES: no swelling NEUROLOGIC:  Alert and oriented x 3 PSYCHIATRIC:  Normal affect   ASSESSMENT:    1. Coronary artery disease involving native coronary artery of native heart without angina pectoris   2. Essential hypertension   3. Zenker diverticulum   4. Dyslipidemia    PLAN:    In order of problems listed above:  Coronary disease.  He does not demonstrate any signs and symptoms of worsening of this condition however there are some contemplation about surgery for Zenker's diverticuli under general esthesia, therefore I think will be reasonable to assess his coronary artery status will schedule him to have Lexiscan .  As a part of evaluation before that surgery we will do echocardiogram to assess left  ventricle ejection fraction. Essential hypertension blood pressure elevated today we will recheck it before he leaves the room, we favor to keep his blood pressure a little bit on the higher side secondary to the fact that he does have orthostatic hypotension with multiple falls before. Zenker's diverticulum, he is for potential surgery.  Both him and his daughter  expressed some concerns about safety of this approach.  Will evaluate him from cardiac standpoint review which helped him to make decision.  That surgery will require interruption of his anticoagulation for at least 2 days. Dyslipidemia he is taking Crestor  40 which I will continue, Overall clearly deterioration from physical and psychological point of view.  In my opinion he need to have neuropsychological evaluation to try to pinpoint exactly what the problem is.  He also has some difficulty speaking and swallowing.  I am worried about potential ALS, second opinion from neurologist will be required   Medication Adjustments/Labs and Tests Ordered: Current medicines are reviewed at length with the patient today.  Concerns regarding medicines are outlined above.  No orders of the defined types were placed in this encounter.  Medication changes: No orders of the defined types were placed in this encounter.   Signed, Lamar DOROTHA Fitch, MD, [facility] 10/18/2024 12:09 PM    [facility]    [1]  Current Meds  Medication Sig   colesevelam  (WELCHOL ) 625 MG tablet Take 1,250 mg by mouth 2 (two) times daily with a meal.   furosemide  (LASIX ) 20 MG tablet Take 1 tablet (20 mg total) by mouth daily. May take additional doses as directed by physician for increased swelling or shortness of breath   metoprolol  succinate (TOPROL -XL) 25 MG 24 hr tablet Take 12.5 mg by mouth daily.   omeprazole  (PRILOSEC) 20 MG capsule Take 1 capsule (20 mg total) by mouth daily.   Polyethyl Glycol-Propyl Glycol (SYSTANE) 0.4-0.3 % SOLN Place 1 drop into both eyes daily.   potassium chloride  (KLOR-CON  M) 10 MEQ tablet TAKE 1 TABLET (10 MEQ TOTAL) BY MOUTH DAILY AS NEEDED (TAKE ON DAYS LASIX  IS TAKEN).   rivaroxaban  (XARELTO ) 20 MG TABS tablet Take 1 tablet (20 mg total) by mouth daily with supper.   rosuvastatin  (CRESTOR ) 40 MG tablet Take 1 tablet (40 mg total) by mouth at bedtime.   TRELEGY ELLIPTA  100-62.5-25 MCG/ACT AEPB Inhale 1 puff into the lungs daily.   "

## 2024-10-20 ENCOUNTER — Encounter: Payer: Self-pay | Admitting: Gastroenterology

## 2024-10-23 NOTE — Telephone Encounter (Signed)
 Please do not worry about our missed appt [facility] appointment was much more needed RG

## 2024-10-24 ENCOUNTER — Ambulatory Visit: Payer: Self-pay | Admitting: Cardiology

## 2024-10-24 ENCOUNTER — Ambulatory Visit

## 2024-10-24 DIAGNOSIS — I441 Atrioventricular block, second degree: Secondary | ICD-10-CM

## 2024-10-24 LAB — CUP PACEART REMOTE DEVICE CHECK
Battery Remaining Longevity: 105 mo
Battery Voltage: 2.99 V
Brady Statistic RA Percent Paced: 0.3 %
Brady Statistic RV Percent Paced: 98.89 %
Date Time Interrogation Session: 20260129011614
Implantable Lead Connection Status: 753985
Implantable Lead Connection Status: 753985
Implantable Lead Implant Date: 20221102
Implantable Lead Implant Date: 20221121
Implantable Lead Location: 753859
Implantable Lead Location: 753860
Implantable Lead Model: 3830
Implantable Lead Model: 5076
Implantable Pulse Generator Implant Date: 20221102
Lead Channel Impedance Value: 285 Ohm
Lead Channel Impedance Value: 304 Ohm
Lead Channel Impedance Value: 380 Ohm
Lead Channel Impedance Value: 437 Ohm
Lead Channel Pacing Threshold Amplitude: 0.625 V
Lead Channel Pacing Threshold Amplitude: 0.75 V
Lead Channel Pacing Threshold Pulse Width: 0.4 ms
Lead Channel Pacing Threshold Pulse Width: 0.4 ms
Lead Channel Sensing Intrinsic Amplitude: 1.375 mV
Lead Channel Sensing Intrinsic Amplitude: 1.375 mV
Lead Channel Sensing Intrinsic Amplitude: 6 mV
Lead Channel Sensing Intrinsic Amplitude: 6 mV
Lead Channel Setting Pacing Amplitude: 1.5 V
Lead Channel Setting Pacing Amplitude: 2 V
Lead Channel Setting Pacing Pulse Width: 0.4 ms
Lead Channel Setting Sensing Sensitivity: 0.9 mV
Zone Setting Status: 755011
Zone Setting Status: 755011

## 2024-10-25 ENCOUNTER — Telehealth (HOSPITAL_COMMUNITY): Payer: Self-pay | Admitting: *Deleted

## 2024-10-25 NOTE — Telephone Encounter (Signed)
 I was not able to reach patient to remind him about his STRESS TEST on 10/29/24 at 8:15.

## 2024-10-29 ENCOUNTER — Ambulatory Visit

## 2024-10-29 DIAGNOSIS — Z01810 Encounter for preprocedural cardiovascular examination: Secondary | ICD-10-CM

## 2024-10-29 LAB — MYOCARDIAL PERFUSION IMAGING
LV dias vol: 90 mL (ref 62–150)
LV sys vol: 37 mL
Nuc Stress EF: 59 %
Peak HR: 70 {beats}/min
Rest HR: 64 {beats}/min
Rest Nuclear Isotope Dose: 10.8 mCi
SDS: 1
SRS: 1
SSS: 2
Stress Nuclear Isotope Dose: 31.1 mCi
TID: 0.93

## 2024-10-29 MED ORDER — TECHNETIUM TC 99M TETROFOSMIN IV KIT
10.8000 | PACK | Freq: Once | INTRAVENOUS | Status: AC | PRN
  Administered 2024-10-29: 10.8 via INTRAVENOUS

## 2024-10-29 MED ORDER — TECHNETIUM TC 99M TETROFOSMIN IV KIT
31.1000 | PACK | Freq: Once | INTRAVENOUS | Status: AC | PRN
  Administered 2024-10-29: 31.1 via INTRAVENOUS

## 2024-10-29 MED ORDER — REGADENOSON 0.4 MG/5ML IV SOLN
0.4000 mg | Freq: Once | INTRAVENOUS | Status: AC
  Administered 2024-10-29: 0.4 mg via INTRAVENOUS

## 2024-10-30 ENCOUNTER — Ambulatory Visit: Payer: Self-pay | Admitting: Cardiology

## 2024-10-30 ENCOUNTER — Telehealth: Payer: Self-pay

## 2024-10-30 NOTE — Telephone Encounter (Signed)
 Left message on My Chart with Stress test results per Dr. Karry note. Routed to PCP.

## 2024-11-01 NOTE — Progress Notes (Signed)
 Remote PPM Transmission

## 2024-11-05 ENCOUNTER — Other Ambulatory Visit

## 2024-11-05 ENCOUNTER — Ambulatory Visit: Admitting: Oncology

## 2024-11-19 ENCOUNTER — Ambulatory Visit

## 2025-01-15 ENCOUNTER — Ambulatory Visit: Admitting: Cardiology

## 2025-01-23 ENCOUNTER — Ambulatory Visit

## 2025-04-24 ENCOUNTER — Ambulatory Visit

## 2025-07-24 ENCOUNTER — Ambulatory Visit

## 2025-10-23 ENCOUNTER — Ambulatory Visit
